# Patient Record
Sex: Male | Born: 1950
Health system: Southern US, Community
[De-identification: ages and names within clinical notes are randomized; demographics above are authoritative.]

## PROBLEM LIST (undated history)

## (undated) ENCOUNTER — Inpatient Hospital Stay: Payer: Self-pay | Admitting: Physician Assistant

## (undated) DIAGNOSIS — T7840XA Allergy, unspecified, initial encounter: Secondary | ICD-10-CM

## (undated) DIAGNOSIS — I739 Peripheral vascular disease, unspecified: Secondary | ICD-10-CM

## (undated) DIAGNOSIS — E785 Hyperlipidemia, unspecified: Secondary | ICD-10-CM

## (undated) DIAGNOSIS — I10 Essential (primary) hypertension: Secondary | ICD-10-CM

## (undated) DIAGNOSIS — M1990 Unspecified osteoarthritis, unspecified site: Secondary | ICD-10-CM

## (undated) DIAGNOSIS — F419 Anxiety disorder, unspecified: Secondary | ICD-10-CM

## (undated) DIAGNOSIS — C61 Malignant neoplasm of prostate: Secondary | ICD-10-CM

## (undated) DIAGNOSIS — K219 Gastro-esophageal reflux disease without esophagitis: Secondary | ICD-10-CM

## (undated) HISTORY — DX: Essential (primary) hypertension: I10

## (undated) HISTORY — DX: Hyperlipidemia, unspecified: E78.5

## (undated) HISTORY — DX: Peripheral vascular disease, unspecified: I73.9

## (undated) HISTORY — DX: Anxiety disorder, unspecified: F41.9

## (undated) HISTORY — DX: Malignant neoplasm of prostate: C61

## (undated) HISTORY — DX: Allergy, unspecified, initial encounter: T78.40XA

## (undated) HISTORY — PX: ROTATOR CUFF REPAIR: SHX139

## (undated) HISTORY — PX: KNEE ARTHROSCOPY: SUR90

## (undated) SURGERY — Surgical Case
Anesthesia: *Unknown

---

## 2000-07-19 ENCOUNTER — Encounter: Admission: RE | Admit: 2000-07-19 | Discharge: 2000-10-17 | Payer: Self-pay | Admitting: Family Medicine

## 2003-11-23 ENCOUNTER — Ambulatory Visit (HOSPITAL_COMMUNITY): Admission: RE | Admit: 2003-11-23 | Discharge: 2003-11-23 | Payer: Self-pay | Admitting: Gastroenterology

## 2005-01-19 ENCOUNTER — Ambulatory Visit (HOSPITAL_BASED_OUTPATIENT_CLINIC_OR_DEPARTMENT_OTHER): Admission: RE | Admit: 2005-01-19 | Discharge: 2005-01-19 | Payer: Self-pay | Admitting: Orthopedic Surgery

## 2005-01-19 ENCOUNTER — Ambulatory Visit (HOSPITAL_COMMUNITY): Admission: RE | Admit: 2005-01-19 | Discharge: 2005-01-19 | Payer: Self-pay | Admitting: Orthopedic Surgery

## 2005-01-19 ENCOUNTER — Encounter (INDEPENDENT_AMBULATORY_CARE_PROVIDER_SITE_OTHER): Payer: Self-pay | Admitting: Specialist

## 2008-03-02 HISTORY — PX: EYE SURGERY: SHX253

## 2010-01-31 ENCOUNTER — Encounter: Admission: RE | Admit: 2010-01-31 | Discharge: 2010-01-31 | Payer: Self-pay | Admitting: Interventional Cardiology

## 2010-07-18 NOTE — Op Note (Signed)
Michael [name], Michael [name]              ACCOUNT NO.:  [id number]   MEDICAL RECORD NO.:  [id number]          PATIENT TYPE:  AMB   LOCATION:  DSC                          FACILITY:  [facility]   PHYSICIAN:  Harvie Junior, M.D.   DATE OF BIRTH:  [birth year]   DATE OF PROCEDURE:  01/19/2005  DATE OF DISCHARGE:                                 OPERATIVE REPORT   PREOPERATIVE DIAGNOSIS:  Medial meniscal tear with suspected chondromalacia  medially.   POSTOPERATIVE DIAGNOSIS:  1.  Medial meniscal tear with chondromalacia of the medial compartment.  2.  Lateral meniscal tear.  3.  Chondromalacia in the patellofemoral joint.  4.  Proliferative synovitis, medial, lateral, anterior, superior medial, and      superior lateral.   PROCEDURE:  1.  Partial medial and partial lateral meniscectomy with corresponding      debridement of chondromalacia in the medial femoral compartment.  2.  Debridement of chondromalacia of the patella.  3.  Four compartment synovectomy of chondromalacia with sending of a      synovial biopsy.   SURGEON:  Harvie Junior, M.D.   ASSISTANT:  Marshia Ly, P.A.-C.   ANESTHESIA:  General.   BRIEF HISTORY:  Mr. Eichinger is a 60 year old male with a long history of  having a twisting type injury to his knee at work.  He was ultimately  evaluated by Dr. Scherrie Bateman who sent him for MRI which showed that he had a  medial meniscal tear as well as some chondromalacia in the patellar medial  compartment.  We evaluated him in the office and felt that he needed knee  arthroscopy because of continued complaints of pain and effusion.  He  ultimately was taken to the operating room for this procedure.   DESCRIPTION OF PROCEDURE:  The patient was taken to the operating room and  after adequate anesthesia was obtained with general anesthesia, the patient  was placed on the operating table.  The left leg was prepped and draped in  the usual sterile fashion.  Following this, routine  arthroscopic examination  of the knee revealed there was an obvious effusion in the knee joint which  was drained.  He had significant chondromalacia on the medial femoral  condyle which was debrided as well as a flap tear back posteriorly.  He had  posterior horn medial meniscal tear under surface which was debrided.  Over  laterally, he had a radial tear in the posterior horn of the lateral  meniscus which was debrided.  He had proliferative synovitis, really quite  dramatically throughout the knee.  In the lateral gutter, the synovectomy  was performed.  The medial gutter had a synovectomy performed.  Anteriorly,  a synovectomy was performed.  He also had chondromalacia of the patella  which was debrided at this point.  I then went up into the superior patellar  pouch and we did debridement of his synovium superior medially as well as  superior laterally and got a significant amount of synovial tissue out.  It  was a little bit concerning the amount of synovium that he had  and biopsy  forceps were used and this proliferative synovium was taken out of the knee  and sent to pathology for evaluation for possible pigmented villonodular  synovitis.  At this point, the knee was copiously  irrigated and suctioned dry.  The arthroscopic portals were closed with a  bandage.  A sterile compressive dressing was applied.  The patient was taken  to the recovery room and was noted to be in satisfactory condition.  Estimated blood loss was none.      Harvie Junior, M.D.  Electronically Signed     JLG/MEDQ  D:  01/19/2005  T:  01/19/2005  Job:  [id number]

## 2010-07-18 NOTE — Op Note (Signed)
NAME:  Michael [name], Michael [name]              ACCOUNT NO.:  [id number]   MEDICAL RECORD NO.:  [id number]          PATIENT TYPE:  AMB   LOCATION:  ENDO                         FACILITY:  [facility]   PHYSICIAN:  John C. Madilyn Fireman, M.D.    DATE OF BIRTH:  [birth year]   DATE OF PROCEDURE:  11/23/2003  DATE OF DISCHARGE:                                 OPERATIVE REPORT   PROCEDURE:  Colonoscopy.   INDICATIONS FOR PROCEDURE:  Average-risk colon cancer screening in a 60-year-  old patient with no previous screening.   PROCEDURE:  The patient was placed in the left lateral decubitus position  and placed on the pulse monitor with continuous low flow oxygen delivered by  nasal cannula.  He was sedated with 75 mcg IV fentanyl and 6 mg IV Versed.  The Olympus video colonoscope is inserted into the rectum and advanced to  the cecum, confirmed by transillumination at McBurney's point and  visualization of the ileocecal valve and appendiceal orifice.  Prep is  excellent.  The cecum, ascending, transverse, descending, and sigmoid colon  all appeared normal with no masses, polyps, diverticula, or other mucosal  abnormalities.  The rectum likewise appeared normal, and retroflexed view of  the anus revealed no obvious internal hemorrhoids.  The scope was then  withdrawn, and the patient returned to the recovery room in stable  condition.  He tolerated the procedure well, and there were no immediate  complications.   IMPRESSION:  Normal colonoscopy.   PLAN:  Next colon screening by sigmoidoscopy in five years.      JCH/MEDQ  D:  11/23/2003  T:  11/23/2003  Job:  [id number]   cc:   Molly Maduro L. Foy [place], M.D.  [address] [place]  [place] [address]  Fax: [phone]

## 2010-09-01 ENCOUNTER — Other Ambulatory Visit: Payer: Self-pay | Admitting: Specialist

## 2010-09-01 DIAGNOSIS — M25562 Pain in left knee: Secondary | ICD-10-CM

## 2010-09-05 ENCOUNTER — Ambulatory Visit
Admission: RE | Admit: 2010-09-05 | Discharge: 2010-09-05 | Disposition: A | Discharge status: Home / Self Care | Payer: PRIVATE HEALTH INSURANCE | Source: Ambulatory Visit | Attending: Specialist | Admitting: Specialist

## 2010-09-05 DIAGNOSIS — M25562 Pain in left knee: Secondary | ICD-10-CM

## 2010-10-27 ENCOUNTER — Encounter: Payer: Self-pay | Admitting: Vascular Surgery

## 2010-10-27 ENCOUNTER — Ambulatory Visit (INDEPENDENT_AMBULATORY_CARE_PROVIDER_SITE_OTHER): Payer: PRIVATE HEALTH INSURANCE | Admitting: Vascular Surgery

## 2010-10-27 ENCOUNTER — Encounter (INDEPENDENT_AMBULATORY_CARE_PROVIDER_SITE_OTHER): Payer: PRIVATE HEALTH INSURANCE

## 2010-10-27 VITALS — BP 137/83 | HR 110 | Resp 20 | Ht 71.0 in | Wt 225.0 lb

## 2010-10-27 DIAGNOSIS — I70219 Atherosclerosis of native arteries of extremities with intermittent claudication, unspecified extremity: Secondary | ICD-10-CM

## 2010-10-27 DIAGNOSIS — I739 Peripheral vascular disease, unspecified: Secondary | ICD-10-CM

## 2010-10-27 NOTE — Progress Notes (Signed)
Subjective:     Patient ID: Michael [name], male   DOB: [birth year], 60 y.o.   MRN: [id number]  HPI this patient is a 60 year old male with severe right leg claudication symptoms followed by Dr. Eldridge Dace. He states that he is only able to walk about 50 feet before experiencing severe claudication cramping in the right calf extending up into the lower thigh. His right foot becomes numb and tingles. He has no symptoms in the left leg. He denies any history of nonhealing ulcers infection or gangrene. He has been tried on Pletal and Trental with no improvement with either medication. He had a CT scan with contrast performed about a year ago which apparently revealed some blockage in his right superficial femoral artery. He is seen today for further evaluation for these limiting symptoms.  Past Medical History  Diagnosis Date  . Diabetes mellitus   . Hypertension   . Hyperlipidemia   . Anxiety   . Allergy     History  Substance Use Topics  . Smoking status: Never Smoker   . Smokeless tobacco: Former Neurosurgeon    Quit date: 10/26/1981  . Alcohol Use: 0.0 oz/week     occaisional    Family History  Problem Relation Age of Onset  . Heart disease Father     Allergies  Allergen Reactions  . Demerol   . Penicillins     Current outpatient prescriptions:amLODipine (NORVASC) 10 MG tablet, Take 10 mg by mouth daily.  , Disp: , Rfl: ;  aspirin 325 MG EC tablet, Take 325 mg by mouth daily.  , Disp: , Rfl: ;  benazepril (LOTENSIN) 40 MG tablet, Take 40 mg by mouth daily.  , Disp: , Rfl: ;  clonazePAM (KLONOPIN) 0.5 MG tablet, Take 0.5 mg by mouth daily.  , Disp: , Rfl: ;  hydrochlorothiazide 25 MG tablet, Take 25 mg by mouth daily. One half daily  , Disp: , Rfl:  ibuprofen (ADVIL,MOTRIN) 200 MG tablet, Take 200 mg by mouth every 6 (six) hours as needed.  , Disp: , Rfl: ;  insulin aspart (NOVOLOG) 100 UNIT/ML injection, Inject 100 Units into the skin 3 (three) times daily before meals.  , Disp: , Rfl: ;   insulin glargine (LANTUS) 100 UNIT/ML injection, Inject 100 Units into the skin at bedtime.  , Disp: , Rfl:  metFORMIN (GLUMETZA) 1000 MG (MOD) 24 hr tablet, Take 1,000 mg by mouth daily with breakfast.  , Disp: , Rfl: ;  nabumetone (RELAFEN) 500 MG tablet, Take 500 mg by mouth 2 (two) times daily.  , Disp: , Rfl: ;  pentoxifylline (TRENTAL) 400 MG CR tablet, Take 400 mg by mouth 3 (three) times daily with meals.  , Disp: , Rfl: ;  rosuvastatin (CRESTOR) 10 MG tablet, Take 10 mg by mouth daily.  , Disp: , Rfl:  sildenafil (VIAGRA) 50 MG tablet, Take 50 mg by mouth daily as needed.  , Disp: , Rfl: ;  tadalafil (CIALIS) 20 MG tablet, Take 20 mg by mouth daily as needed.  , Disp: , Rfl:   BP 137/83  Pulse 110  Resp 20  Ht 5\' 11"  (1.803 m)  Wt 225 lb (102.059 kg)  BMI 31.38 kg/m2  Body mass index is 31.38 kg/(m^2).        Review of Systems he denies any chest pain dyspnea on exertion PND orthopnea or neurologic symptoms. He does have left knee joint pain and is followed by Dr. Otelia Sergeant. He also complains of numbness in  his right foot at times. All other systems are negative for complete review of systems.     Objective:   Physical Exam blood pressure is 137/83 heart rate 110 respirations 20 General he is well-developed well-nourished male is in no apparent distress he is alert and oriented x3 Lungs clear nostril patient no rhonchi or wheezing Cardiovascular exam reveals a regular rhythm no murmur  carotid pulses are 3+ with no bruits Abdomen soft nontender with no masses Neurologic exam is normal Musculoskeletal exam free measure deformities Skin is free of rashes Lower extremity exam of the left leg has 3+ femoral popliteal dorsalis pedis and posterior tibial pulse palpable. Right leg has a 1-2+ femoral pulse and his uncomfortable to deep palpation. There is no erythema or mass. He has absent popliteal and distal pulses in the right leg.  Today I ordered more stringy arterial Dopplers  which are reviewed and interpreted. ABI in the right leg is 0.4 in the left leg is 0.87. He has evidence of right superficial femoral occlusive    Assessment:     Probable right superficial femoral and/or popliteal occlusive disease on the right with severe limiting claudication symptoms right leg    Plan:     Aorto bifemoral angiography with a bilateral lower extremity runoff to be performed by Dr. Myra Gianotti on September 5. If intervention of right leg is feasible possibly will be performed at that time. If not we'll consider femoral popliteal bypass grafting depending on the anatomy. Risks and benefits have been thoroughly discussed and he would like to proceed signed

## 2010-11-05 ENCOUNTER — Ambulatory Visit (HOSPITAL_COMMUNITY)
Admission: RE | Admit: 2010-11-05 | Discharge: 2010-11-05 | Disposition: A | Discharge status: Home / Self Care | Payer: PRIVATE HEALTH INSURANCE | Source: Ambulatory Visit | Attending: Surgery | Admitting: Surgery

## 2010-11-05 DIAGNOSIS — E785 Hyperlipidemia, unspecified: Secondary | ICD-10-CM | POA: Insufficient documentation

## 2010-11-05 DIAGNOSIS — I70219 Atherosclerosis of native arteries of extremities with intermittent claudication, unspecified extremity: Secondary | ICD-10-CM | POA: Insufficient documentation

## 2010-11-05 DIAGNOSIS — F411 Generalized anxiety disorder: Secondary | ICD-10-CM | POA: Insufficient documentation

## 2010-11-05 DIAGNOSIS — E119 Type 2 diabetes mellitus without complications: Secondary | ICD-10-CM | POA: Insufficient documentation

## 2010-11-05 DIAGNOSIS — I10 Essential (primary) hypertension: Secondary | ICD-10-CM | POA: Insufficient documentation

## 2010-11-05 HISTORY — PX: ANGIOPLASTY / STENTING FEMORAL: SUR30

## 2010-11-05 LAB — POCT ACTIVATED CLOTTING TIME
Activated Clotting Time: 204 seconds
Activated Clotting Time: 204 seconds
Activated Clotting Time: 171 seconds

## 2010-11-05 LAB — POCT I-STAT, CHEM 8
HCT: 43 % (ref 39.0–52.0)
Hemoglobin: 14.6 g/dL (ref 13.0–17.0)
Potassium: 4 mEq/L (ref 3.5–5.1)
Sodium: 137 mEq/L (ref 135–145)
TCO2: 19 mmol/L (ref 0–100)

## 2010-11-05 LAB — GLUCOSE, CAPILLARY
Glucose-Capillary: 210 mg/dL — ABNORMAL HIGH (ref 70–99)
Glucose-Capillary: 223 mg/dL — ABNORMAL HIGH (ref 70–99)

## 2010-11-06 ENCOUNTER — Telehealth: Payer: Self-pay

## 2010-11-06 DIAGNOSIS — R52 Pain, unspecified: Secondary | ICD-10-CM

## 2010-11-06 MED ORDER — HYDROCODONE-ACETAMINOPHEN 5-500 MG PO TABS: 1.0000 | ORAL_TABLET | Freq: Four times a day (QID) | ORAL | Status: AC | PRN

## 2010-11-06 NOTE — Telephone Encounter (Addendum)
Pt. states had an angiogram yesterday, and due to bleeding, had a clamp used to control bleeding, and had a lot of pain from all the "pressure" from the clamp.  States left groin was swollen twice the size when he got home yesterday, but no bruising noted at that time.  States this am when he got up, noted large amt of black and blue discoloration above and below the left groin.  C/o a lot of pain in left groin that goes through to back and kidney area.  States I really feel that the pain is related to the amt of pressure used yesterday.  Denies that area of bruising is firm.  States it feels soft.  Also, says that the swelling is slightly increased from last night.  Requesting pain medication.  Advised pt. will call Dr. Myra Gianotti to inform of pt. Sx's.  Called Dr. Myra Gianotti and informed of above pt. Complaints.  Rec'd verbal order to call in pain medication per standing order.  Pt.advised of pain medication to be called in./CP,RN 11/06/10 11:00 am  Called in Hydrocodone/Acetaminophen 5/500mg , take 1 tab q 6 hrs/prn/pain/ #20/ no refillls  to [facility] on [address]./CP,RN

## 2010-11-07 ENCOUNTER — Telehealth: Payer: Self-pay | Admitting: *Deleted

## 2010-11-07 NOTE — Telephone Encounter (Signed)
Pt called to say he is still in a lot of pain. Bruising & Swelling still in Left Leg where RN in PACU put clamp. Now testicles are purple and swollen but soft;no hardness. When called was in a lot of pain. Since called has eaten, taken vicodin, walked around a little; has put ice on testicles and is elevating leg & testicles; and feels better. I told him to call back if gets hard,actively bleeds or increases in swelling. Also, if happens over weekend to go to ER.

## 2010-11-11 ENCOUNTER — Telehealth: Payer: Self-pay

## 2010-11-11 DIAGNOSIS — R1909 Other intra-abdominal and pelvic swelling, mass and lump: Secondary | ICD-10-CM

## 2010-11-11 DIAGNOSIS — R52 Pain, unspecified: Secondary | ICD-10-CM

## 2010-11-11 NOTE — Telephone Encounter (Signed)
Pt. called to report continued pain in left groin, that is worse when getting up and down; ie: "getting in and out of car".  Also states that the left groin has a "3" long  x 1/2" wide , firm area" in site, where catheter was introduced. Also c/o pain in left thigh, and down to behind knee. States does not feel he can continue to work until this is evaluated, due to the discomfort.  Discussed w/ [name].  Advised to have pt. scheduled for ultrasound of left groin, and appt. w/ MD or PA. Tomorrow (9/12) or Thursday (9/13).  Advised pt. will call him tomorrow with appt.  Agrees w/ plan.

## 2010-11-12 ENCOUNTER — Other Ambulatory Visit (INDEPENDENT_AMBULATORY_CARE_PROVIDER_SITE_OTHER): Payer: PRIVATE HEALTH INSURANCE

## 2010-11-12 ENCOUNTER — Ambulatory Visit (INDEPENDENT_AMBULATORY_CARE_PROVIDER_SITE_OTHER): Payer: PRIVATE HEALTH INSURANCE | Admitting: Physician Assistant

## 2010-11-12 VITALS — BP 150/98 | HR 105 | Resp 18

## 2010-11-12 DIAGNOSIS — S75009A Unspecified injury of femoral artery, unspecified leg, initial encounter: Secondary | ICD-10-CM

## 2010-11-12 DIAGNOSIS — R1909 Other intra-abdominal and pelvic swelling, mass and lump: Secondary | ICD-10-CM

## 2010-11-12 DIAGNOSIS — I739 Peripheral vascular disease, unspecified: Secondary | ICD-10-CM

## 2010-11-12 DIAGNOSIS — R52 Pain, unspecified: Secondary | ICD-10-CM

## 2010-11-12 NOTE — Progress Notes (Signed)
HPI: 60 y/o WM who underwent stenting of his Right SFA and popliteal artery by Dr. Myra Gianotti on 11/05/10 with the arterial stick on the left.  He returns today with complaints of tenderness, swelling as well as swelli and soreness of his left groin.  He has a hard time getting in and out of the car.  It is bothersome to him to stand from a sitting position.  He states that he has tingling in his left foot, which was not present before.  He had LE arterial duplex prior to the appt with the provider.  Filed Vitals:   11/12/10 1403  BP: 150/98  Pulse: 105  Resp: 18    Physical Exam:  He has extensive ecchymosis of his left groin, thigh, around to his buttocks.   There is a hematoma in his left groin.  It is tender to palpation. 2+ DP pulses bilaterally and both his LE are warm and well perfused.  Lower Extremity Duplex Scan:  Left Groin pseudoaneurysm and hematoma 2.96cm x 2.53cm AP x 3.1 cm   Assessment: 60 y/o WM s/p Stenting of the right SFA and popliteal artery.  Now with pseudoaneurysm in left groin.  Plan:  After Dr. Adele Dan assessment of the pt, he spoke with the lab tech and the pseudoaneurysm is probably not compressible.  He will speak to Dr. Myra Gianotti tomorrow to decide upon a treatment plan, which will consist of either waiting and watching with f/u in 2 weeks or return to the OR for repair and washout of the hematoma.  Rx for oxycodone 5mg  1 po q 4-6hr prn pain #30 NF was given.  Pt knows to call Friday mid-morning if he has not heard back from our office.  Attending is Dr. Edilia Bo

## 2010-11-14 ENCOUNTER — Telehealth: Payer: Self-pay

## 2010-11-14 DIAGNOSIS — I729 Aneurysm of unspecified site: Secondary | ICD-10-CM

## 2010-11-14 NOTE — Telephone Encounter (Signed)
Pt. called to ask what plan is for follow-up, or treatment to pseudoaneurysm (L) groin?  Called Dr. Myra Gianotti re: plan.  Advised if pt. Doing okay, then have him scheduled for U/S left groin, and office visit in one week.  Called pt. Back to check on his status.  States "it still really hurts when he sits, but that the bruising is better".  Related that  he hasn't been taking the Oxycodone only because it makes him feel "funny", but that the Ibuprofen wasn't really helping the pain.  States he is comfortable continuing to monitor lump in left groin, and will follow-up in one wk as directed. Advised pt. to call if worsens. Verb. Understanding.

## 2010-11-18 NOTE — Procedures (Unsigned)
LOWER EXTREMITY ARTERIAL EVALUATION-SINGLE LEVEL  INDICATION:  Bruising and "lump" in left groin.  HISTORY: Diabetes: Cardiac: Hypertension: Smoking: Previous Surgery:  11/05/10 - left femoral artery access for abdominal aortogram with right lower extremity run-off; stent placement in right superficial femoral artery/popliteal artery.  RESTING SYSTOLIC PRESSURES: (ABI)                         RIGHT                LEFT Brachial: Anterior tibial: Posterior tibial: Peroneal: DOPPLER WAVEFORM ANALYSIS: Anterior tibial: Posterior tibial: Peroneal:  PREVIOUS ABI'S:  Date:  RIGHT:  LEFT:  ABI's not performed.  DUPLEX:  Triphasic normal velocity (102 cm/s) flow in the distal left external iliac and common femoral arteries.  Phasic left common femoral vein with normal response to distal augmentation.  There is a 2.96 cm sagittal X 2.53 cm AP X 3.1 cm transverse mass in the left groin anterior to the common femoral artery and vein.  Flow is noted within the mass with a "neck" to the common femoral artery being present. Thrombus is noted in the periphery of the mass.  Inferiorly, there is a larger hypoechoic mass with no flow noted, most likely represents hematoma.  This area measures 2.9 cm sagittal X 1.43 cm AP X 3.2 cm transverse.  IMPRESSION:  Left groin pseudoaneurysm and hematoma with measurements noted above.  ___________________________________________ Quita Skye. Hart Rochester, M.D.  CI/MEDQ  D:  11/13/2010  T:  11/13/2010  Job:  [id number]

## 2010-11-19 ENCOUNTER — Encounter: Payer: Self-pay | Admitting: Surgery

## 2010-11-21 ENCOUNTER — Encounter: Payer: Self-pay | Admitting: Surgery

## 2010-11-24 ENCOUNTER — Encounter: Payer: Self-pay | Admitting: Surgery

## 2010-11-24 ENCOUNTER — Ambulatory Visit (INDEPENDENT_AMBULATORY_CARE_PROVIDER_SITE_OTHER): Payer: PRIVATE HEALTH INSURANCE | Admitting: Surgery

## 2010-11-24 ENCOUNTER — Other Ambulatory Visit (INDEPENDENT_AMBULATORY_CARE_PROVIDER_SITE_OTHER): Payer: PRIVATE HEALTH INSURANCE | Admitting: *Deleted

## 2010-11-24 VITALS — BP 153/84 | HR 88 | Temp 98.4°F | Ht 71.0 in | Wt 225.0 lb

## 2010-11-24 DIAGNOSIS — I724 Aneurysm of artery of lower extremity: Secondary | ICD-10-CM

## 2010-11-24 DIAGNOSIS — S301XXA Contusion of abdominal wall, initial encounter: Secondary | ICD-10-CM

## 2010-11-24 DIAGNOSIS — I729 Aneurysm of unspecified site: Secondary | ICD-10-CM

## 2010-11-24 DIAGNOSIS — I739 Peripheral vascular disease, unspecified: Secondary | ICD-10-CM

## 2010-11-24 DIAGNOSIS — I70219 Atherosclerosis of native arteries of extremities with intermittent claudication, unspecified extremity: Secondary | ICD-10-CM | POA: Insufficient documentation

## 2010-11-24 NOTE — Progress Notes (Signed)
[facility]    Patient name: Michael [name] MRN: [id number] DOB: 1950/07/17 Sex: male     Chief Complaint  Patient presents with  . Aneurysm    postop SFA stenting on 11-05-10    HISTORY OF PRESENT ILLNESS: This is a 60 year old gentleman who comes back today for followup. He was initially seen and evaluated by [name] for right leg claudication. He underwent arteriogram and stenting of his right superficial femoral artery on 11/05/2010 greater than 95% distal right superficial femoral artery stenosis was found with three-vessel runoff he is the patient's symptoms have dissipated. He is now able to ambulate without difficulty. He is complaining of swelling in his right leg. He was complaining of left groin pain and was seen in our office and evaluated with ultrasound which showed a large pseudoaneurysm approximately 2 x 3 cm with a long neck. I elected to observe this he comes back in today for followup and repeat ultrasound evaluation. I did speak with him over the telephone discussed with him options of injection versus operation versus observation. He wished to continue to observe this. He is back today for followup he has had no significant changes Past Medical History  Diagnosis Date  . Diabetes mellitus   . Hypertension   . Hyperlipidemia   . Anxiety   . Allergy     Past Surgical History  Procedure Date  . Rotator cuff repair     1990  . Knee arthroscopy   . Angioplasty / stenting femoral 11/05/10    Left SFA stent    History   Social History  . Marital Status: Married    Spouse Name: N/A    Number of Children: N/A  . Years of Education: N/A   Occupational History  . Not on file.   Social History Main Topics  . Smoking status: Never Smoker   . Smokeless tobacco: Former Neurosurgeon    Quit date: 10/26/1981  . Alcohol Use: 0.0 oz/week     occasional drink  . Drug Use: No  . Sexually Active: Not on file   Other Topics Concern  . Not on  file   Social History Narrative  . No narrative on file    Family History  Problem Relation Age of Onset  . Heart disease Father     Allergies as of 11/24/2010 - Review Complete 11/24/2010  Allergen Reaction Noted  . Demerol  10/27/2010  . Penicillins  10/27/2010    Current Outpatient Prescriptions on File Prior to Visit  Medication Sig Dispense Refill  . amLODipine (NORVASC) 10 MG tablet Take 10 mg by mouth daily.        Marland Kitchen aspirin 325 MG EC tablet Take 325 mg by mouth daily.        . benazepril (LOTENSIN) 40 MG tablet Take 40 mg by mouth daily.        . clonazePAM (KLONOPIN) 0.5 MG tablet Take 0.5 mg by mouth daily.        . hydrochlorothiazide 25 MG tablet Take 25 mg by mouth daily. One half daily        . ibuprofen (ADVIL,MOTRIN) 200 MG tablet Take 200 mg by mouth every 6 (six) hours as needed.        . insulin aspart (NOVOLOG) 100 UNIT/ML injection Inject 100 Units into the skin 3 (three) times daily before meals.        . insulin glargine (LANTUS) 100 UNIT/ML injection Inject 100 Units into the  skin at bedtime.        . metFORMIN (GLUMETZA) 1000 MG (MOD) 24 hr tablet Take 1,000 mg by mouth daily with breakfast.        . nabumetone (RELAFEN) 500 MG tablet Take 500 mg by mouth 2 (two) times daily.        . pentoxifylline (TRENTAL) 400 MG CR tablet Take 400 mg by mouth 3 (three) times daily with meals.        . rosuvastatin (CRESTOR) 10 MG tablet Take 10 mg by mouth daily.        . sildenafil (VIAGRA) 50 MG tablet Take 50 mg by mouth daily as needed.        . tadalafil (CIALIS) 20 MG tablet Take 20 mg by mouth daily as needed.           REVIEW OF SYSTEMS: No changes  PHYSICAL EXAMINATION: General: The patient appears their stated age.  Vital signs are BP 153/84  Pulse 88  Temp(Src) 98.4 F (36.9 C) (Oral)  Ht 5\' 11"  (1.803 m)  Wt 225 lb (102.059 kg)  BMI 31.38 kg/m2 Pulmonary: There is a good air exchange bilaterally without wheezing or rales. Musculoskeletal: There  are no major deformities.  There is no significant extremity pain. Neurologic: No focal weakness or paresthesias are detected, Skin: There are no ulcer or rashes noted. Psychiatric: The patient has normal affect. Cardiovascular: There is a regular rate and rhythm without significant murmur appreciated. Palpable dorsalis pedis pulse bilaterally   Diagnostic Studies Followup ultrasound today shows a left groin hematoma measuring 1.98 x 3.29 cm. There is no evidence of arterial flow within the hematoma.  Assessment: Status post right superficial femoral artery stenting for claudication complicated by left groin pseudoaneurysm Plan: The patient is doing very well. He is status post superficial femoral artery stenting using a silver 6 x 80 and 6 x 1 20 stent. The claudication symptoms in his right leg have been resolved. His only complaint is that of swelling have given him a prescription for 20-30 mm compression stockings. I suspect this will resolve over the next 3-6 months.  The left groin pseudoaneurysm has spontaneously thrombose. This has been documented by ultrasound.  The patient will come back to see me in 3 months with ultrasound of the left groin and right leg stent  V. Charlena Cross, M.D. [facility] [place] Office: [phone]

## 2010-11-24 NOTE — Progress Notes (Signed)
Addended by: Sharee Pimple on: 11/24/2010 03:28 PM   Modules accepted: Orders

## 2010-11-29 NOTE — Op Note (Signed)
Michael [name], ACHOR              ACCOUNT NO.:  [id number]  MEDICAL RECORD NO.:  [id number]  LOCATION:  [facility]SC                         FACILITY:  [facility]  PHYSICIAN:  Juleen China IV, MDDATE OF BIRTH:  1950-10-08  DATE OF PROCEDURE:  11/05/2010 DATE OF DISCHARGE:                              OPERATIVE REPORT   PREOPERATIVE DIAGNOSIS:  Right leg claudication.  POSTOPERATIVE DIAGNOSIS:  Right leg claudication.  PROCEDURE PERFORMED: 1. Ultrasound access left femoral artery. 2. Abdominal aortogram. 3. Right lower extremity runoff. 4. Stent right superficial femoral/popliteal artery. 5. Third-order catheterization.  INDICATIONS:  This is a 60 year old gentleman with lifestyle-limiting claudication of the right leg, his ABIs dropped to 0.40, he can walk less than 50 feet.  He comes in today for evaluation of possible intervention.  Risks and benefits were discussed in the holding area. Informed consent was signed.  PROCEDURE:  The patient was identified in the holding area, taken to room 8, placed supine on the operating table.  Both groins were prepped and draped in usual fashion.  Time-out was called.  The left femoral artery was evaluated by ultrasound found to be calcified, but patent. It was accessed under ultrasound guidance with an 18-gauge needle and a 0.035 wire was advanced into the aorta under fluoroscopic visualization, a 5-French sheath was placed.  Over the wire Omni Flush catheter was advanced to the level L1, abdominal aortogram was obtained.  Next, using the Omni Flush catheter, a Bentson wire and a Glidewire, the aortic bifurcation was crossed.  Catheter was placed in the right external iliac artery and right leg runoff was performed.  FINDINGS:  Aortogram:  Visualized portions of the suprarenal abdominal aorta showed no significant disease.  There is a single right renal artery which is widely patent.  There is a main left and accessory left renal artery  which are widely patent without significant stenosis.  The infrarenal abdominal aorta is widely patent, bilateral common external and internal iliac arteries are widely patent.  Right lower extremity:  The right common femoral artery is calcified without stenosis.  The right profunda femoral artery is patent throughout its course.  The right superficial femoral artery is heavily calcified.  There is a high-grade stenosis approximately 95% in the distal superficial femoral artery with diffuse disease in the distal superficial femoral artery for approximately 20 cm.  Popliteal artery below the knee is widely patent.  There is three-vessel runoff.  At this point, the decision was made to intervene over a Rosen wire a 7- [place] 45-cm Ansel 1 sheath was placed in the right external iliac artery.  The patient was fully heparinized.  Heparin levels were checked with ACT throughout the case.  Next, I used a Glide cath and a Glidewire to attempt to cross the lesion in the right superficial femoral artery. The Glide cath was not long enough and therefore, however, I was able to get a Versacore wire across the lesion and back intraluminal, I do believe I went subintimal to cross this lesion as it was heavily calcified and very difficult to cross.  I tried to advance a 3-mm balloon across this stenosis over the Avala wire, however, this would not  advance.  I therefore used an 0.035 Quick-Cross catheter which did cross the lesion.  A contrast injection was then performed through the Quick-Cross catheter which was in the popliteal artery which confirmed successful cannulation.  I then elected to predilate the lesion.  I tried to use a 3-mm balloon, however, this would not cross the area of stenosis.  I then had to put the Quick-Cross catheter back down to switch out for no one for platform.  I then used a 2-mm balloon which did cross the lesion and perform sequential balloon dilatations with a 2,  3 and 4 mm balloons.  At this point, I attempted to place a stent in this area, however, even with an 0.18 platform stent, I could not get the stent to cross the lesion, therefore I came back with a 5-mm balloon and dilated the area of concern, after dilating it with a 5-mm balloon I was able to get a Cook Zilver 6 x 80 stent across this area and I then used a second 6 x 120 Cook Zilver stent.  The stents were molded to confirmation with 5-mm balloon.  Completion arteriogram was then performed which showed widely patent superficial femoral artery with no change in runoff.  At this point in time, decision was made to terminate the procedure.  Catheters and wires were removed.  The patient was taken to the holding area for sheath pull once his coagulation profile corrects.  IMPRESSION:  High-grade distal right superficial femoral artery stenosis, greater than 95%.  This was successfully stented using a Cook Zilver 6 x 80 and 6 x 120 self-expanding stents.  There is three-vessel runoff of the right leg.     Juleen China IV, MD     [facility]/MEDQ  D:  11/05/2010  T:  11/05/2010  Job:  [id number]  Electronically Signed by Arelia Longest IV MD on 11/29/2010 09:56:46 AM

## 2010-12-01 NOTE — Procedures (Unsigned)
VASCULAR LAB EXAM  INDICATION:  Follow up left groin pseudoaneurysm.  HISTORY: Diabetes:  Yes. Cardiac:  No. Hypertension:  Yes.  EXAM:  Left groin duplex to evaluate known pseudoaneurysm.  IMPRESSION: 1. Left groin hematoma measuring 1.98 X 3.29 cm was noted. 2. No evidence of arterial flow was noted within the hematoma.  ___________________________________________ V. Charlena Cross, MD  EM/MEDQ  D:  11/24/2010  T:  11/24/2010  Job:  [id number]

## 2010-12-09 ENCOUNTER — Other Ambulatory Visit: Payer: PRIVATE HEALTH INSURANCE

## 2010-12-09 ENCOUNTER — Ambulatory Visit: Payer: PRIVATE HEALTH INSURANCE | Admitting: Vascular Surgery

## 2011-01-12 ENCOUNTER — Ambulatory Visit: Payer: No Typology Code available for payment source | Attending: Specialist | Admitting: Physical Therapy

## 2011-01-12 DIAGNOSIS — M25569 Pain in unspecified knee: Secondary | ICD-10-CM | POA: Insufficient documentation

## 2011-01-12 DIAGNOSIS — IMO0001 Reserved for inherently not codable concepts without codable children: Secondary | ICD-10-CM | POA: Insufficient documentation

## 2011-01-12 DIAGNOSIS — R262 Difficulty in walking, not elsewhere classified: Secondary | ICD-10-CM | POA: Insufficient documentation

## 2011-01-12 DIAGNOSIS — M25669 Stiffness of unspecified knee, not elsewhere classified: Secondary | ICD-10-CM | POA: Insufficient documentation

## 2011-01-13 ENCOUNTER — Ambulatory Visit: Payer: No Typology Code available for payment source | Admitting: Physical Therapy

## 2011-01-19 ENCOUNTER — Ambulatory Visit: Payer: No Typology Code available for payment source | Admitting: Physical Therapy

## 2011-01-20 ENCOUNTER — Ambulatory Visit: Payer: No Typology Code available for payment source | Admitting: Physical Therapy

## 2011-01-26 ENCOUNTER — Ambulatory Visit: Payer: No Typology Code available for payment source | Admitting: Physical Therapy

## 2011-01-28 ENCOUNTER — Ambulatory Visit: Payer: No Typology Code available for payment source | Admitting: Physical Therapy

## 2011-02-02 ENCOUNTER — Ambulatory Visit: Payer: PRIVATE HEALTH INSURANCE | Attending: Specialist | Admitting: Physical Therapy

## 2011-02-02 DIAGNOSIS — M25669 Stiffness of unspecified knee, not elsewhere classified: Secondary | ICD-10-CM | POA: Insufficient documentation

## 2011-02-02 DIAGNOSIS — M25569 Pain in unspecified knee: Secondary | ICD-10-CM | POA: Insufficient documentation

## 2011-02-02 DIAGNOSIS — R262 Difficulty in walking, not elsewhere classified: Secondary | ICD-10-CM | POA: Insufficient documentation

## 2011-02-02 DIAGNOSIS — IMO0001 Reserved for inherently not codable concepts without codable children: Secondary | ICD-10-CM | POA: Insufficient documentation

## 2011-02-04 ENCOUNTER — Ambulatory Visit: Payer: PRIVATE HEALTH INSURANCE | Admitting: Physical Therapy

## 2011-02-09 ENCOUNTER — Encounter: Payer: PRIVATE HEALTH INSURANCE | Admitting: Physical Therapy

## 2011-02-09 ENCOUNTER — Ambulatory Visit: Payer: PRIVATE HEALTH INSURANCE | Admitting: Physical Therapy

## 2011-02-10 ENCOUNTER — Ambulatory Visit: Payer: PRIVATE HEALTH INSURANCE | Admitting: Physical Therapy

## 2011-02-11 ENCOUNTER — Encounter: Payer: PRIVATE HEALTH INSURANCE | Admitting: Physical Therapy

## 2011-02-12 ENCOUNTER — Encounter: Payer: PRIVATE HEALTH INSURANCE | Admitting: Physical Therapy

## 2011-02-27 ENCOUNTER — Encounter: Payer: Self-pay | Admitting: Surgery

## 2011-03-02 ENCOUNTER — Encounter: Payer: Self-pay | Admitting: Surgery

## 2011-03-02 ENCOUNTER — Other Ambulatory Visit (INDEPENDENT_AMBULATORY_CARE_PROVIDER_SITE_OTHER): Payer: PRIVATE HEALTH INSURANCE | Admitting: *Deleted

## 2011-03-02 ENCOUNTER — Ambulatory Visit (INDEPENDENT_AMBULATORY_CARE_PROVIDER_SITE_OTHER): Payer: PRIVATE HEALTH INSURANCE | Admitting: Surgery

## 2011-03-02 VITALS — BP 130/78 | HR 94 | Resp 16 | Ht 71.0 in | Wt 228.0 lb

## 2011-03-02 DIAGNOSIS — S301XXA Contusion of abdominal wall, initial encounter: Secondary | ICD-10-CM

## 2011-03-02 DIAGNOSIS — I724 Aneurysm of artery of lower extremity: Secondary | ICD-10-CM

## 2011-03-02 DIAGNOSIS — I739 Peripheral vascular disease, unspecified: Secondary | ICD-10-CM

## 2011-03-02 DIAGNOSIS — I70219 Atherosclerosis of native arteries of extremities with intermittent claudication, unspecified extremity: Secondary | ICD-10-CM

## 2011-03-02 DIAGNOSIS — Z48812 Encounter for surgical aftercare following surgery on the circulatory system: Secondary | ICD-10-CM

## 2011-03-02 NOTE — Procedures (Unsigned)
LOWER EXTREMITY ARTERIAL DUPLEX  INDICATION:  Follow up right SFA stenting.  HISTORY: Diabetes:  Yes. Cardiac:  __________  DICTATION ENDED AT THIS POINT. Hypertension: Smoking: Previous Surgery:  SINGLE LEVEL ARTERIAL EXAM                         RIGHT                LEFT Brachial: Anterior tibial: Posterior tibial: Peroneal: Ankle/Brachial Index:  LOWER EXTREMITY ARTERIAL DUPLEX EXAM  DUPLEX:  IMPRESSION:  ___________________________________________ V. Charlena Cross, MD  EM/MEDQ  D:  03/02/2011  T:  03/02/2011  Job:  [id number]

## 2011-03-02 NOTE — Progress Notes (Signed)
[facility] [place]   Patient name: Michael [name] MRN: [id number] DOB: Feb 18, 1951 Sex: male     Chief Complaint  Patient presents with  . PVD    3 month f/u on stenting 11-05-10 ( Right SFA)    HISTORY OF PRESENT ILLNESS: The patient is back today for followup. He was initially seen by [name] for right leg claudication. He underwent stenting of his right superficial femoral artery on 11/05/2010 for a greater than 95% distal right superficial femoral artery stenosis. He has three-vessel runoff. This procedure was complicated by a left groin pseudoaneurysm which resolved spontaneously. He's back today without complaints. He has no problems with claudication. Overall he is doing very well.  Past Medical History  Diagnosis Date  . Diabetes mellitus   . Hypertension   . Hyperlipidemia   . Anxiety   . Allergy     Past Surgical History  Procedure Date  . Rotator cuff repair     1990  . Knee arthroscopy   . Angioplasty / stenting femoral 11/05/10    Left SFA stent    History   Social History  . Marital Status: Married    Spouse Name: N/A    Number of Children: N/A  . Years of Education: N/A   Occupational History  . Not on file.   Social History Main Topics  . Smoking status: Never Smoker   . Smokeless tobacco: Former Neurosurgeon    Quit date: 10/26/1981  . Alcohol Use: 0.0 oz/week     occasional drink  . Drug Use: No  . Sexually Active: Not on file   Other Topics Concern  . Not on file   Social History Narrative  . No narrative on file    Family History  Problem Relation Age of Onset  . Heart disease Father     Allergies as of 03/02/2011 - Review Complete 03/02/2011  Allergen Reaction Noted  . Demerol  10/27/2010  . Penicillins  10/27/2010    Current Outpatient Prescriptions on File Prior to Visit  Medication Sig Dispense Refill  . amLODipine (NORVASC) 10 MG tablet Take 10 mg by mouth daily.        Marland Kitchen aspirin 325 MG EC tablet Take  325 mg by mouth daily.        . benazepril (LOTENSIN) 40 MG tablet Take 40 mg by mouth daily.        . clonazePAM (KLONOPIN) 0.5 MG tablet Take 0.5 mg by mouth daily.        . hydrochlorothiazide 25 MG tablet Take 25 mg by mouth daily. One half daily        . ibuprofen (ADVIL,MOTRIN) 200 MG tablet Take 200 mg by mouth every 6 (six) hours as needed.        . insulin aspart (NOVOLOG) 100 UNIT/ML injection Inject 100 Units into the skin 3 (three) times daily before meals.        . insulin glargine (LANTUS) 100 UNIT/ML injection Inject 100 Units into the skin at bedtime.        . metFORMIN (GLUMETZA) 1000 MG (MOD) 24 hr tablet Take 1,000 mg by mouth daily with breakfast.        . nabumetone (RELAFEN) 500 MG tablet Take 500 mg by mouth 2 (two) times daily.        . pentoxifylline (TRENTAL) 400 MG CR tablet Take 400 mg by mouth 3 (three) times daily with meals.       Marland Kitchen  rosuvastatin (CRESTOR) 10 MG tablet Take 10 mg by mouth daily.        . sildenafil (VIAGRA) 50 MG tablet Take 50 mg by mouth daily as needed.        . tadalafil (CIALIS) 20 MG tablet Take 20 mg by mouth daily as needed.           REVIEW OF SYSTEMS: No chest pain no shortness of breath no claudication or rest pain and ulceration. All other systems are negative.  PHYSICAL EXAMINATION:   Vital signs are BP 130/78  Pulse 94  Resp 16  Ht 5\' 11"  (1.803 m)  Wt 228 lb (103.42 kg)  BMI 31.80 kg/m2  SpO2 98% General: The patient appears their stated age. HEENT:  No gross abnormalities Pulmonary:  Non labored breathing Abdomen: Soft and non-tender Musculoskeletal: There are no major deformities. Neurologic: No focal weakness or paresthesias are detected, Skin: There are no ulcer or rashes noted. Psychiatric: The patient has normal affect. Cardiovascular: Palpable pedal pulses bilaterally left groin is soft   Diagnostic Studies Duplex was performed of the right leg. There is no evidence of elevated velocities within the right  superficial femoral and popliteal artery. The waveforms are triphasic.  Assessment: Status post right superficial femoral artery stenting Plan: The patient is doing very well. His duplex shows no evidence of stenosis. I did talk to him about the possibility of in-stent restenosis. He was placed on our surveillance protocol. He will see the PA in 3 months. I will schedule him back to see me in one year. He will need ultrasounds at 36 and 12 months following his procedure and then by yearly followed by yearly  V. Charlena Cross, M.D. [facility] [place] Office: [phone] Pager:  [phone]

## 2011-03-23 NOTE — Procedures (Unsigned)
VASCULAR LAB EXAM  INDICATION:  Follow up right SFA stenting.  HISTORY: Diabetes:  Yes. Cardiac:  No. Hypertension:  Yes. Other:  Hyperlipidemia.  EXAM:  Follow up right SFA stenting underwent 11/05/2010.  IMPRESSION: 1. Patent right superficial femoral artery with triphasic waveforms     noted throughout. 2. There is an elevated velocity of 243 cm/s noted in the mid to     distal superficial femoral artery.  ___________________________________________ V. Charlena Cross, MD  EM/MEDQ  D:  03/02/2011  T:  03/02/2011  Job:  [id number]

## 2011-06-12 ENCOUNTER — Encounter: Payer: Self-pay | Admitting: Surgery

## 2011-06-15 ENCOUNTER — Ambulatory Visit (INDEPENDENT_AMBULATORY_CARE_PROVIDER_SITE_OTHER): Payer: PRIVATE HEALTH INSURANCE | Admitting: Neurosurgery

## 2011-06-15 ENCOUNTER — Encounter: Payer: Self-pay | Admitting: Surgery

## 2011-06-15 ENCOUNTER — Ambulatory Visit (INDEPENDENT_AMBULATORY_CARE_PROVIDER_SITE_OTHER): Payer: PRIVATE HEALTH INSURANCE | Admitting: Vascular Surgery

## 2011-06-15 VITALS — BP 150/80 | HR 90 | Temp 98.3°F | Ht 71.0 in | Wt 227.0 lb

## 2011-06-15 DIAGNOSIS — Z48812 Encounter for surgical aftercare following surgery on the circulatory system: Secondary | ICD-10-CM

## 2011-06-15 DIAGNOSIS — I70219 Atherosclerosis of native arteries of extremities with intermittent claudication, unspecified extremity: Secondary | ICD-10-CM

## 2011-06-15 DIAGNOSIS — I739 Peripheral vascular disease, unspecified: Secondary | ICD-10-CM

## 2011-06-15 NOTE — Progress Notes (Signed)
[facility] [place] HISTORY AND PHYSICAL   CC: Six-month followup lower extremity arterial duplex Referring Physician: [name]  History of Present Illness: [name] of Michael [name] that was initially seen by Michael [name] for right leg claudication. He subsequently underwent a right superficial femoral artery stent in September 2012. Since that time he has done well, he reports no new medical problems, has had no new surgery. He does have diabetes that is fairly well-controlled, he does state that he is in a weight loss program.  Past Medical History  Diagnosis Date  . Diabetes mellitus   . Hypertension   . Hyperlipidemia   . Anxiety   . Allergy     ROS: [x]  Positive   [ ]  Denies    General: [ ]  Weight loss, [ ]  Fever, [ ]  chills Neurologic: [ ]  Dizziness, [ ]  Blackouts, [ ]  Seizure [ ]  Stroke, [ ]  "Mini stroke", [ ]  Slurred speech, [ ]  Temporary blindness; [ ]  weakness in arms or legs, [ ]  Hoarseness Cardiac: [ ]  Chest pain/pressure, [ ]  Shortness of breath at rest [ ]  Shortness of breath with exertion, [ ]  Atrial fibrillation or irregular heartbeat Vascular: [ ]  Pain in legs with walking, [ ]  Pain in legs at rest, [ ]  Pain in legs at night,  [ ]  Non-healing ulcer, [ ]  Blood clot in vein/DVT,   Pulmonary: [ ]  Home oxygen, [ ]  Productive cough, [ ]  Coughing up blood, [ ]  Asthma,  [ ]  Wheezing Musculoskeletal:  [ ]  Arthritis, [ ]  Low back pain, [ ]  Joint pain Hematologic: [ ]  Easy Bruising, [ ]  Anemia; [ ]  Hepatitis Gastrointestinal: [ ]  Blood in stool, [ ]  Gastroesophageal Reflux/heartburn, [ ]  Trouble swallowing Urinary: [ ]  chronic Kidney disease, [ ]  on HD - [ ]  MWF or [ ]  TTHS, [ ]  Burning with urination, [ ]  Difficulty urinating Skin: [ ]  Rashes, [ ]  Wounds Psychological: [ ]  Anxiety, [ ]  Depression   Social History History  Substance Use Topics  . Smoking status: Never Smoker   . Smokeless tobacco: Former Neurosurgeon    Quit date:  10/26/1981  . Alcohol Use: 0.0 oz/week     occasional drink    Family History Family History  Problem Relation Age of Onset  . Heart disease Father     Allergies  Allergen Reactions  . Demerol   . Penicillins     Current Outpatient Prescriptions  Medication Sig Dispense Refill  . amLODipine (NORVASC) 10 MG tablet Take 10 mg by mouth daily.        Marland Kitchen aspirin 325 MG EC tablet Take 325 mg by mouth daily.        . benazepril (LOTENSIN) 40 MG tablet Take 40 mg by mouth daily.        . clonazePAM (KLONOPIN) 0.5 MG tablet Take 0.5 mg by mouth daily.        . hydrochlorothiazide 25 MG tablet Take 25 mg by mouth daily. One half daily        . ibuprofen (ADVIL,MOTRIN) 200 MG tablet Take 200 mg by mouth every 6 (six) hours as needed.        . insulin aspart (NOVOLOG) 100 UNIT/ML injection Inject 100 Units into the skin 3 (three) times daily before meals.        . insulin glargine (LANTUS) 100 UNIT/ML injection Inject 100 Units into the skin at bedtime.        Marland Kitchen  metFORMIN (GLUMETZA) 1000 MG (MOD) 24 hr tablet Take 1,000 mg by mouth daily with breakfast.        . nabumetone (RELAFEN) 500 MG tablet Take 500 mg by mouth 2 (two) times daily.        . rosuvastatin (CRESTOR) 10 MG tablet Take 10 mg by mouth daily.        . sildenafil (VIAGRA) 50 MG tablet Take 50 mg by mouth daily as needed.        . tadalafil (CIALIS) 20 MG tablet Take 20 mg by mouth daily as needed.        . pentoxifylline (TRENTAL) 400 MG CR tablet Take 400 mg by mouth 3 (three) times daily with meals.         Physical Examination  Filed Vitals:   06/15/11 1113  BP: 150/80  Pulse: 90  Temp: 98.3 F (36.8 C)    Body mass index is 31.66 kg/(m^2).  General:  WDWN in NAD Gait: Normal HEENT: WNL Eyes: Pupils equal Pulmonary: normal non-labored breathing , without Rales, rhonchi,  wheezing Cardiac: RRR, without  Murmurs, rubs or gallops; No carotid bruits Abdomen: soft, NT, no masses Skin: no rashes, ulcers  noted Vascular Exam/Pulses: Patient has 2+ femoral pulses, PT and DP pulses bilaterally. He also has 2+ radial pulses  Extremities without ischemic changes, no Gangrene , no cellulitis; no open wounds;  Musculoskeletal: no muscle wasting or atrophy  Neurologic: A&O X 3; Appropriate Affect ; SENSATION: normal; MOTOR FUNCTION:  moving all extremities equally. Speech is fluent/normal  Non-Invasive Vascular Imaging: Lower extremity arterial duplex today shows that the stent is open and patent at the distal end of the stent there is some increased velocity of 225 and Michael [name] is aware of this.  ASSESSMENT/PLAN: Assessment as above, Michael [name] spoke with the patient after I saw him. There is a anticoagulant study going on that Michael [name] spoke to the patient about taking part in. The patient agrees and the group from [place] research will contact the patient regarding this. The patient returns in 6 months for lower extremity arterial duplex. He will followup with [name] His questions were encouraged and answered.  Michael [name] ANP  Clinic M.D.: Myra [name]

## 2011-06-15 NOTE — Procedures (Unsigned)
LOWER EXTREMITY ARTERIAL DUPLEX  INDICATION:  Peripheral vascular disease  HISTORY: Diabetes:  Yes Cardiac:  No Hypertension:  Yes Smoking:  Previous chewing tobacco, no cigarettes Previous Surgery:  Right superficial femoral artery/popliteal artery stent 11/05/2010  SINGLE LEVEL ARTERIAL EXAM                         RIGHT                LEFT Brachial: Anterior tibial: Posterior tibial: Peroneal: Ankle/Brachial Index:   0.86                 1.03   PREVIOUS ABI:  Date:  10/27/2010  RIGHT:  0.44  LEFT:  0.87.  LOWER EXTREMITY ARTERIAL DUPLEX EXAM  DUPLEX:  Diffuse heterogeneous plaque present involving the right common femoral, profunda femoral and superficial femoral arteries. Elevated velocity present involving the right profunda femoral artery suggestive of 30%-49% stenosis. Elevated velocity present with a ratio of 4.2 in the distal thigh segment of the superficial femoral artery stent suggesting >75% stenosis.  IMPRESSION: 1. Right native artery stenosis and in-stent stenosis present as     listed above. 2. Right ankle brachial index is in the mild claudication range. 3. Left ankle brachial index is in the normal range.  ___________________________________________ V. Charlena Cross, MD  SH/MEDQ  D:  06/15/2011  T:  06/15/2011  Job:  [id number]

## 2011-06-15 NOTE — Progress Notes (Signed)
ABI performed @ [facility] on 06/15/2011

## 2011-06-16 ENCOUNTER — Other Ambulatory Visit: Payer: Self-pay | Admitting: Cardiovascular Disease

## 2011-06-17 ENCOUNTER — Telehealth: Payer: Self-pay

## 2011-06-17 DIAGNOSIS — I739 Peripheral vascular disease, unspecified: Secondary | ICD-10-CM

## 2011-06-17 NOTE — Telephone Encounter (Signed)
This is for the [employer].

## 2011-06-19 ENCOUNTER — Ambulatory Visit (HOSPITAL_COMMUNITY)
Admission: RE | Admit: 2011-06-19 | Discharge: 2011-06-19 | Disposition: A | Discharge status: Home / Self Care | Payer: Self-pay | Source: Ambulatory Visit | Attending: Cardiovascular Disease | Admitting: Cardiovascular Disease

## 2011-06-19 DIAGNOSIS — I739 Peripheral vascular disease, unspecified: Secondary | ICD-10-CM

## 2011-06-19 DIAGNOSIS — Z006 Encounter for examination for normal comparison and control in clinical research program: Secondary | ICD-10-CM | POA: Insufficient documentation

## 2011-06-19 NOTE — Progress Notes (Signed)
VASCULAR LAB PRELIMINARY  ARTERIAL  ABI completed:    RIGHT    LEFT    PRESSURE WAVEFORM  PRESSURE WAVEFORM  BRACHIAL 155 Tri  BRACHIAL 131 Tri  DP 144 Mono DP 175 Tri   AT   AT    PT 146 Bi  PT 170 Tri   PER   PER    GREAT TOE  NA GREAT TOE  NA    RIGHT LEFT  ABI 0.94 1.13     Farrel Demark, RDMS 06/19/2011, 12:09 PM

## 2011-06-24 ENCOUNTER — Encounter: Payer: Self-pay | Admitting: *Deleted

## 2011-08-28 ENCOUNTER — Other Ambulatory Visit: Payer: Self-pay | Admitting: *Deleted

## 2011-12-02 ENCOUNTER — Other Ambulatory Visit: Payer: Self-pay | Admitting: Orthopaedic Surgery

## 2011-12-02 DIAGNOSIS — M1990 Unspecified osteoarthritis, unspecified site: Secondary | ICD-10-CM

## 2011-12-02 DIAGNOSIS — M25569 Pain in unspecified knee: Secondary | ICD-10-CM

## 2011-12-02 DIAGNOSIS — R531 Weakness: Secondary | ICD-10-CM

## 2011-12-02 DIAGNOSIS — R52 Pain, unspecified: Secondary | ICD-10-CM

## 2011-12-21 ENCOUNTER — Ambulatory Visit: Payer: PRIVATE HEALTH INSURANCE | Admitting: Surgery

## 2011-12-25 ENCOUNTER — Ambulatory Visit (HOSPITAL_COMMUNITY)
Admission: RE | Admit: 2011-12-25 | Discharge: 2011-12-25 | Disposition: A | Discharge status: Home / Self Care | Payer: Self-pay | Source: Ambulatory Visit | Attending: Cardiovascular Disease | Admitting: Cardiovascular Disease

## 2011-12-25 DIAGNOSIS — I70209 Unspecified atherosclerosis of native arteries of extremities, unspecified extremity: Secondary | ICD-10-CM | POA: Insufficient documentation

## 2011-12-25 DIAGNOSIS — I739 Peripheral vascular disease, unspecified: Secondary | ICD-10-CM

## 2011-12-25 NOTE — Progress Notes (Signed)
VASCULAR LAB PRELIMINARY  ARTERIAL  ABI completed:    RIGHT    LEFT    PRESSURE WAVEFORM  PRESSURE WAVEFORM  BRACHIAL 171 T BRACHIAL 160 M  DP 93 M DP 178 M  AT   AT    PT 158 M PT 190 T  PER   PER    GREAT TOE  NA GREAT TOE  NA    RIGHT LEFT  ABI 0.92 >1.0     Makinsey Pepitone, 12/25/2011, 11:51 AM

## 2012-01-04 ENCOUNTER — Other Ambulatory Visit: Payer: Self-pay | Admitting: *Deleted

## 2012-01-04 MED ORDER — AMBULATORY NON FORMULARY MEDICATION: Status: DC

## 2012-02-03 ENCOUNTER — Telehealth: Payer: Self-pay | Admitting: Surgery

## 2012-02-03 NOTE — Telephone Encounter (Signed)
Message copied by Fredrich Birks on Wed Feb 03, 2012 12:12 PM ------      Message from: Lamar Blinks S      Created: Wed Feb 03, 2012  9:30 AM      Regarding: needs appt. w/ Dr. Myra Gianotti      Contact: [phone]       Pt. Requesting appt. With Dr. Myra Gianotti.  Is established pt.  S/p right Fem. And  Pop art stent 11/2010.  Having symptoms with tingling in face and bilateral forearms and tip of ring finger.  Wants to discuss w/ [facility].  Feels sx's r/t drug study that he is involved in.  Has been eval per [facility], and told to see vasc. Surgeon again.  Will wait on vasc. Studies, until [facility] sees pt.  Pt. Really wants to try to get in this year.  Any poss. Of sched. Him 12/9 or 12/23?  Cell phone # [phone].

## 2012-02-03 NOTE — Telephone Encounter (Signed)
Spoke with patient re: appointment with [facility] on 02/22/12- pt will be here at 8:30am, dpm

## 2012-02-19 ENCOUNTER — Encounter: Payer: Self-pay | Admitting: Surgery

## 2012-02-22 ENCOUNTER — Encounter (INDEPENDENT_AMBULATORY_CARE_PROVIDER_SITE_OTHER): Payer: No Typology Code available for payment source | Admitting: *Deleted

## 2012-02-22 ENCOUNTER — Ambulatory Visit (INDEPENDENT_AMBULATORY_CARE_PROVIDER_SITE_OTHER): Payer: No Typology Code available for payment source | Admitting: Surgery

## 2012-02-22 ENCOUNTER — Encounter: Payer: Self-pay | Admitting: Surgery

## 2012-02-22 VITALS — BP 157/82 | HR 96 | Resp 16 | Ht 71.0 in | Wt 226.0 lb

## 2012-02-22 DIAGNOSIS — I739 Peripheral vascular disease, unspecified: Secondary | ICD-10-CM

## 2012-02-22 DIAGNOSIS — Z48812 Encounter for surgical aftercare following surgery on the circulatory system: Secondary | ICD-10-CM

## 2012-02-22 NOTE — Progress Notes (Signed)
[facility] [place]   Patient name: Michael [name] MRN: [id number] DOB: 18-Nov-1950 Sex: male     Chief Complaint  Patient presents with  . Re-evaluation    tingling in face and bilateral forearms and tip of ring finger, ulcerations on forearms also c/o SOB and pain in chest, pt feels like it is related to drug study    HISTORY OF PRESENT ILLNESS: The patient is back today for followup. He is status post stenting of his right superficial femoral artery in September of 2012. This was done for lifestyle limiting claudication. He has not had any difficulty since that time. He has had a dramatic improvement in his ability to ambulate. I put him on theEuclid trial. He has had some symptoms which we do not feel are related to the drug. He complains of face and elbow tingling. He also notes scabbing of both arms.    Past Medical History  Diagnosis Date  . Diabetes mellitus   . Hypertension   . Hyperlipidemia   . Anxiety   . Allergy     Past Surgical History  Procedure Date  . Rotator cuff repair     1990  . Knee arthroscopy   . Angioplasty / stenting femoral 11/05/10    Left SFA stent    History   Social History  . Marital Status: Married    Spouse Name: N/A    Number of Children: N/A  . Years of Education: N/A   Occupational History  . Not on file.   Social History Main Topics  . Smoking status: Never Smoker   . Smokeless tobacco: Former Neurosurgeon    Quit date: 10/26/1981  . Alcohol Use: 0.0 oz/week     Comment: occasional drink  . Drug Use: No  . Sexually Active: Not on file   Other Topics Concern  . Not on file   Social History Narrative  . No narrative on file    Family History  Problem Relation Age of Onset  . Heart disease Father     Allergies as of 02/22/2012 - Review Complete 02/22/2012  Allergen Reaction Noted  . Demerol  10/27/2010  . Penicillins  10/27/2010    Current Outpatient Prescriptions on File Prior to Visit   Medication Sig Dispense Refill  . AMBULATORY NON FORMULARY MEDICATION Medication Name: EUCLID STUDY DRUG-Plavix vs Brilinta      . amLODipine (NORVASC) 10 MG tablet Take 10 mg by mouth daily.        . benazepril (LOTENSIN) 40 MG tablet Take 40 mg by mouth daily.        . clonazePAM (KLONOPIN) 0.5 MG tablet Take 0.5 mg by mouth at bedtime as needed.       . hydrochlorothiazide 25 MG tablet Take 25 mg by mouth daily. One half daily        . insulin aspart (NOVOLOG) 100 UNIT/ML injection Inject 100 Units into the skin 3 (three) times daily before meals.        . insulin glargine (LANTUS) 100 UNIT/ML injection Inject 100 Units into the skin at bedtime.        . metFORMIN (GLUMETZA) 1000 MG (MOD) 24 hr tablet Take 1,000 mg by mouth daily with breakfast.        . nabumetone (RELAFEN) 500 MG tablet Take 500 mg by mouth 2 (two) times daily.        . rosuvastatin (CRESTOR) 10 MG tablet Take 10 mg by mouth daily.        Marland Kitchen  tadalafil (CIALIS) 20 MG tablet Take 20 mg by mouth daily as needed.        Marland Kitchen ibuprofen (ADVIL,MOTRIN) 200 MG tablet Take 200 mg by mouth every 6 (six) hours as needed.        . pentoxifylline (TRENTAL) 400 MG CR tablet Take 400 mg by mouth 3 (three) times daily with meals.       . sildenafil (VIAGRA) 50 MG tablet Take 50 mg by mouth daily as needed.           REVIEW OF SYSTEMS: Cardiovascular: No chest pain, chest pressure, palpitations, orthopnea, or dyspnea on exertion. No claudication or rest pain,   Pulmonary: No productive cough, asthma or wheezing. Neurologic: Numbness on face and elbows  Hematologic: Reports bleeding from scabs over his bilateral forearms  Musculoskeletal: No joint pain or joint swelling. Gastrointestinal: No blood in stool or hematemesis Genitourinary: No dysuria or hematuria. Psychiatric:: No history of major depression. Integumentary: small scabs on bilateral forearm  Constitutional: No fever or chills.  PHYSICAL EXAMINATION:   Vital signs are BP  157/82  Pulse 96  Resp 16  Ht 5\' 11"  (1.803 m)  Wt 226 lb (102.513 kg)  BMI 31.52 kg/m2  SpO2 99% General: The patient appears their stated age. HEENT:  No gross abnormalities Pulmonary:  Non labored breathing Musculoskeletal: There are no major deformities. Neurologic: No focal weakness or paresthesias are detected, Skin: small scabbed over areas on both hands which extend up to the elbow bilaterally. No surrounding erythema  Psychiatric: The patient has normal affect. Cardiovascular: There is a regular rate and rhythm without significant murmur appreciated.Palpable left dorsalis pedis pulse. I cannot palpate right dorsalis pedis.    Diagnostic Studies Duplex ultrasound was ordered interviewed today. This shows an ABI of 0.99 on the right with triphasic waveforms. It is 1.3 on the left with triphasic waveforms. There is an area of increased velocity in the right femoral stent. Peak velocity is 2 90 cm/s   Assessment: Status post right superficial femoral artery stenting.  Plan: With regards to the patient's possible drug reaction, he will take a drug holiday, in anticipation of cataract surgery in one week. I have also spoken to the study coordinator who will contact him. We do not feel that the numbness and tingling is a result of the drug. However, the bleeding areas on the hands could be.  The patient will come back in 6 months for a repeat ultrasound. I will need to pay particular attention to the area of increased velocities within the right superficial femoral stent. If it increases to greater than 300, I will consider angiography  V. Charlena Cross, M.D. [facility] [place] Office: [phone] Pager:  [phone]

## 2012-02-22 NOTE — Addendum Note (Signed)
Addended by: Sharee Pimple on: 02/22/2012 02:06 PM   Modules accepted: Orders

## 2012-06-20 ENCOUNTER — Encounter: Payer: Self-pay | Admitting: *Deleted

## 2012-08-22 ENCOUNTER — Ambulatory Visit: Payer: Self-pay | Admitting: Surgery

## 2012-08-22 ENCOUNTER — Encounter (INDEPENDENT_AMBULATORY_CARE_PROVIDER_SITE_OTHER): Payer: No Typology Code available for payment source | Admitting: Vascular Surgery

## 2012-08-22 DIAGNOSIS — Z48812 Encounter for surgical aftercare following surgery on the circulatory system: Secondary | ICD-10-CM

## 2012-08-22 DIAGNOSIS — I739 Peripheral vascular disease, unspecified: Secondary | ICD-10-CM

## 2012-08-22 NOTE — Progress Notes (Unsigned)
Right lower extremity arterial duplex performed @ [facility] 08/22/2012 as well as ABI .

## 2012-08-23 ENCOUNTER — Other Ambulatory Visit: Payer: Self-pay

## 2012-08-23 DIAGNOSIS — I739 Peripheral vascular disease, unspecified: Secondary | ICD-10-CM

## 2012-08-23 DIAGNOSIS — Z48812 Encounter for surgical aftercare following surgery on the circulatory system: Secondary | ICD-10-CM

## 2012-08-24 ENCOUNTER — Encounter: Payer: Self-pay | Admitting: Surgery

## 2012-11-25 ENCOUNTER — Encounter: Payer: Self-pay | Admitting: Surgery

## 2012-11-28 ENCOUNTER — Ambulatory Visit: Payer: PRIVATE HEALTH INSURANCE | Admitting: Surgery

## 2012-11-28 ENCOUNTER — Ambulatory Visit (HOSPITAL_COMMUNITY)
Admission: RE | Admit: 2012-11-28 | Discharge: 2012-11-28 | Disposition: A | Discharge status: Home / Self Care | Payer: No Typology Code available for payment source | Source: Ambulatory Visit | Attending: Surgery | Admitting: Surgery

## 2012-11-28 DIAGNOSIS — Z48812 Encounter for surgical aftercare following surgery on the circulatory system: Secondary | ICD-10-CM

## 2012-11-28 DIAGNOSIS — I739 Peripheral vascular disease, unspecified: Secondary | ICD-10-CM

## 2013-01-13 ENCOUNTER — Encounter: Payer: Self-pay | Admitting: Interventional Cardiology

## 2013-01-13 ENCOUNTER — Ambulatory Visit (INDEPENDENT_AMBULATORY_CARE_PROVIDER_SITE_OTHER): Payer: No Typology Code available for payment source | Admitting: Interventional Cardiology

## 2013-01-13 VITALS — BP 129/63 | HR 77 | Ht 71.0 in | Wt 220.0 lb

## 2013-01-13 DIAGNOSIS — R0602 Shortness of breath: Secondary | ICD-10-CM

## 2013-01-13 DIAGNOSIS — I739 Peripheral vascular disease, unspecified: Secondary | ICD-10-CM

## 2013-01-13 DIAGNOSIS — R079 Chest pain, unspecified: Secondary | ICD-10-CM

## 2013-01-13 DIAGNOSIS — I10 Essential (primary) hypertension: Secondary | ICD-10-CM | POA: Insufficient documentation

## 2013-01-13 NOTE — Patient Instructions (Addendum)
Your physician recommends that you continue on your current medications as directed. Please refer to the Current Medication list given to you today.  Your physician has requested that you have en exercise stress myoview. For further information please visit [email or web address]. Please follow instruction sheet, as given.  Your physician wants you to follow-up in: 1 year with Dr. Varanasi.  You will receive a reminder letter in the mail two months in advance. If you don't receive a letter, please call our office to schedule the follow-up appointment.  

## 2013-01-13 NOTE — Progress Notes (Signed)
Patient ID: Michael [name], male   DOB: 26-Feb-1951, 62 y.o.   MRN: [id number]    [address] [place], [place]  [address] Phone: [phone] Fax:  [phone]  Date:  01/13/2013   ID:  Michael [name], DOB 03/15/1950, MRN [id number]  PCP:  Michael Rua, MD      History of Present Illness: Michael [name] is a 62 y.o. male who had a PV angiogram and right SFA stent in 2012. He had a left groin pseudoaneurysm. His right leg claudication is improved after the stent. He has been walking, without claudication. He has been in a study. He has some numbness and some skin issues since being in the study. He has had some CP after a URI with a lot of coughing. Worse with deep breathing.  He has some CP when waking up and thinks this is positional.  His blood sugars are better.   He has some SHOB with walking on the treadmill daily, or with walking up stairs.He has some chest pain when he gets to the top of the stairs.  It feels like a dull ache.  He has been walking stairs for exercise as well.       Wt Readings from Last 3 Encounters:  01/13/13 220 lb (99.791 kg)  02/22/12 226 lb (102.513 kg)  06/15/11 227 lb (102.967 kg)     Past Medical History  Diagnosis Date  . Diabetes mellitus   . Hypertension   . Hyperlipidemia   . Anxiety   . Allergy     Current Outpatient Prescriptions  Medication Sig Dispense Refill  . amLODipine (NORVASC) 10 MG tablet Take 10 mg by mouth daily.        . benazepril (LOTENSIN) 40 MG tablet Take 40 mg by mouth daily.        . Canagliflozin (INVOKANA) 300 MG TABS Take by mouth.      . clopidogrel (PLAVIX) 75 MG tablet Take 75 mg by mouth daily with breakfast.      . Cyanocobalamin (VITAMIN B 12 PO) Take by mouth.      . Diclofenac Sodium (VOLTAREN PO) Take by mouth.      . insulin aspart (NOVOLOG) 100 UNIT/ML injection Inject 100 Units into the skin 3 (three) times daily before meals.        . insulin glargine (LANTUS) 100 UNIT/ML  injection Inject 100 Units into the skin at bedtime.        . metFORMIN (GLUMETZA) 1000 MG (MOD) 24 hr tablet Take 1,000 mg by mouth daily with breakfast.        . olopatadine (PATANOL) 0.1 % ophthalmic solution 1 drop 2 (two) times daily.      . rosuvastatin (CRESTOR) 10 MG tablet Take 10 mg by mouth daily.         No current facility-administered medications for this visit.    Allergies:    Allergies  Allergen Reactions  . Actos [Pioglitazone]   . Byetta 10 Mcg Pen [Exenatide]   . Demerol   . Glimepiride     Edema   . Nabumetone   . Oxycodone   . Penicillins     Social History:  The patient  reports that he has never smoked. He quit smokeless tobacco use about 31 years ago. He reports that he drinks alcohol. He reports that he does not use illicit drugs.   Family History:  The patient's family history includes Heart disease in his father.  ROS:  Please see the history of present illness.  No nausea, vomiting.  No fevers, chills.  No focal weakness.  No dysuria.    All other systems reviewed and negative.   PHYSICAL EXAM: VS:  BP 129/63  Pulse 77  Ht 5\' 11"  (1.803 m)  Wt 220 lb (99.791 kg)  BMI 30.70 kg/m2 Well nourished, well developed, in no acute distress HEENT: normal Neck: no JVD, no carotid bruits Cardiac:  normal S1, S2; RRR;  Lungs:  clear to auscultation bilaterally, no wheezing, rhonchi or rales Abd: soft, nontender, no hepatomegaly Ext: no edema Skin: warm and dry Neuro:   no focal abnormalities noted  EKG:  NSR, nonspecific ST segment changes     ASSESSMENT AND PLAN:  Essential hypertension, benign  Continue Amlodipine Besylate Tablet, 10 MG, 1 tablet, Orally, Once a day Continue Benazepril HCl Tablet, 40 MG, 1 tablet, Orally, Once a day Diagnostic Imaging:EKG Michael [name] 02/23/2012 10:00:41 AM > Michael [name] 02/23/2012 10:22:08 AM > NSR, non specific ST segment changes  continue regular exercise and attempt to weight loss. He should also minimize  salt in the diet. If blood pressure remains elevated, could consider switching benazepril to a more potent medication like irbesartan 300 mg daily. check blood pressures at home.   HCTZ was stopped. He is lost a significant amount of weight as well. I encouraged him to continue his current lifestyle modifications.  2. Peripheral vascular disease  he reports that ultrasound shows some restenosis of his right SFA stent. no significant claudication.  F/u with u/s with [name]  He had an SFA velocity of 280 cm/sec. 3. Shortness of breath  Diagnostic Imaging:EC Echocardiogram (Ordered for 02/23/2012) Michael [name] 02/26/2012 01:12:59 PM > Echo report charted. Michael [name] 02/26/2012 03:57:14 PM > Pt notified. , EC Echocardiogram showed LVH but normal LV function.  4. Chest pain:  Some exertional component.  Given PAD and DM, would check cardiolite. He feels that he can walk the treadmill.  Signed, Fredric Mare, MD, [facility] 01/13/2013 10:01 AM

## 2013-01-25 ENCOUNTER — Ambulatory Visit (HOSPITAL_COMMUNITY): Payer: PRIVATE HEALTH INSURANCE | Attending: Cardiovascular Disease | Admitting: Radiology

## 2013-01-25 ENCOUNTER — Encounter: Payer: Self-pay | Admitting: Cardiovascular Disease

## 2013-01-25 ENCOUNTER — Encounter: Payer: Self-pay | Admitting: Surgery

## 2013-01-25 VITALS — BP 159/90 | HR 82 | Ht 71.0 in | Wt 217.0 lb

## 2013-01-25 DIAGNOSIS — R079 Chest pain, unspecified: Secondary | ICD-10-CM | POA: Insufficient documentation

## 2013-01-25 DIAGNOSIS — E785 Hyperlipidemia, unspecified: Secondary | ICD-10-CM | POA: Insufficient documentation

## 2013-01-25 DIAGNOSIS — R0602 Shortness of breath: Secondary | ICD-10-CM | POA: Insufficient documentation

## 2013-01-25 DIAGNOSIS — Z794 Long term (current) use of insulin: Secondary | ICD-10-CM | POA: Insufficient documentation

## 2013-01-25 DIAGNOSIS — Z8249 Family history of ischemic heart disease and other diseases of the circulatory system: Secondary | ICD-10-CM | POA: Insufficient documentation

## 2013-01-25 DIAGNOSIS — I10 Essential (primary) hypertension: Secondary | ICD-10-CM | POA: Insufficient documentation

## 2013-01-25 DIAGNOSIS — I739 Peripheral vascular disease, unspecified: Secondary | ICD-10-CM | POA: Insufficient documentation

## 2013-01-25 DIAGNOSIS — E119 Type 2 diabetes mellitus without complications: Secondary | ICD-10-CM | POA: Insufficient documentation

## 2013-01-25 MED ORDER — TECHNETIUM TC 99M SESTAMIBI GENERIC - CARDIOLITE
30.0000 | Freq: Once | INTRAVENOUS | Status: AC | PRN
  Administered 2013-01-25: 30 via INTRAVENOUS

## 2013-01-25 MED ORDER — TECHNETIUM TC 99M SESTAMIBI GENERIC - CARDIOLITE
10.0000 | Freq: Once | INTRAVENOUS | Status: AC | PRN
  Administered 2013-01-25: 10 via INTRAVENOUS

## 2013-01-25 NOTE — Progress Notes (Signed)
[facility] SITE 3 NUCLEAR MED [address] [place], [place] [address] [phone]    Cardiology Nuclear Med Study  Michael [name] is a 62 y.o. male     MRN : [id number]     DOB: December 22, 1950  Procedure Date: 01/25/2013  Nuclear Med Background Indication for Stress Test:  Evaluation for Ischemia History:  No known CAD, Echo 2013 EF 60-65%, MPI ~8 yrs ago Cardiac Risk Factors: Family History - CAD, Hypertension, IDDM, Lipids and PVD  Symptoms:  Chest Pain, Chest Pain with Exertion and SOB   Nuclear Pre-Procedure Caffeine/Decaff Intake:  None > 12 hrs NPO After: 6:30am   Lungs:  clear O2 Sat: 97% on room air. IV 0.9% NS with Angio Cath:  20g  IV Site: R Wrist x 1, tolerated well IV Started by:  Irean Hong, RN  Chest Size (in):  48 Cup Size: n/a  Height: 5\' 11"  (1.803 m)  Weight:  217 lb (98.431 kg)  BMI:  Body mass index is 30.28 kg/(m^2). Tech Comments:  Fasting CBG was 140 at 06:30 today with 1/2 dose of Novolog  Insulin taken with breakfast per patient. Irean Hong, RN.    Nuclear Med Study 1 or 2 day study: 1 day  Stress Test Type:  Stress  Reading MD: Lance Muss, MD  Order Authorizing Provider:  Lance Muss, MD  Resting Radionuclide: Technetium 40m Sestamibi  Resting Radionuclide Dose: 11.0 mCi   Stress Radionuclide:  Technetium 68m Sestamibi  Stress Radionuclide Dose: 33.0 mCi           Stress Protocol Rest HR: 82 Stress HR: 148  Rest BP: 159/90 Stress BP: 223/103  Exercise Time (min): 6:40 METS: 8.0   Predicted Max HR: 157 bpm % Max HR: 94.27 bpm Rate Pressure Product: 21308   Dose of Adenosine (mg):  n/a Dose of Lexiscan: n/a mg  Dose of Atropine (mg): n/a Dose of Dobutamine: n/a mcg/kg/min (at max HR)  Stress Test Technologist: Nelson Chimes, BS-ES  Nuclear Technologist:  Dario Guardian, CNMT     Rest Procedure:  Myocardial perfusion imaging was performed at rest 45 minutes following the intravenous administration of Technetium  53m Sestamibi. Rest ECG: NSR with non-specific ST-T wave changes  Stress Procedure:  The patient exercised on the treadmill utilizing the Bruce Protocol for 6:40 minutes. The patient stopped due to fatigue and denied any chest pain.  Technetium 41m Sestamibi was injected at peak exercise and myocardial perfusion imaging was performed after a brief delay. Stress ECG: No significant change from baseline ECG  QPS Raw Data Images:  Normal; no motion artifact; normal heart/lung ratio. Stress Images:  Normal homogeneous uptake in all areas of the myocardium. Rest Images:  There is decreased uptake in the inferior wall. Subtraction (SDS):  No evidence of ischemia. Transient Ischemic Dilatation (Normal <1.22):  0.84 Lung/Heart Ratio (Normal <0.45):  0.21  Quantitative Gated Spect Images QGS EDV:  62 ml QGS ESV:  19 ml  Impression Exercise Capacity:  Good exercise capacity. BP Response:  Hypertensive blood pressure response. Clinical Symptoms:  There is dyspnea. ECG Impression:  Nonspecific ST segment changes from baseline persisted. No change from baseline. Comparison with Prior Nuclear Study: No images to compare  Overall Impression:  Normal stress nuclear study.  LV Ejection Fraction: 70%.  LV Wall Motion:  NL LV Function; NL Wall Motion  Corky Crafts., MD, [facility]

## 2013-01-30 ENCOUNTER — Telehealth: Payer: Self-pay | Admitting: Interventional Cardiology

## 2013-01-30 ENCOUNTER — Encounter (HOSPITAL_COMMUNITY): Payer: No Typology Code available for payment source

## 2013-01-30 ENCOUNTER — Other Ambulatory Visit (HOSPITAL_COMMUNITY): Payer: No Typology Code available for payment source

## 2013-01-30 ENCOUNTER — Ambulatory Visit: Payer: No Typology Code available for payment source | Admitting: Surgery

## 2013-01-30 NOTE — Telephone Encounter (Signed)
New message    Want nuclear medicine stress test results-

## 2013-01-30 NOTE — Telephone Encounter (Signed)
Called pt, no answer no vm

## 2013-01-31 NOTE — Telephone Encounter (Signed)
Called pt with results.

## 2013-02-02 ENCOUNTER — Other Ambulatory Visit: Payer: Self-pay | Admitting: Family Medicine

## 2013-02-02 DIAGNOSIS — M545 Low back pain: Secondary | ICD-10-CM

## 2013-02-11 ENCOUNTER — Ambulatory Visit
Admission: RE | Admit: 2013-02-11 | Discharge: 2013-02-11 | Disposition: A | Discharge status: Home / Self Care | Payer: PRIVATE HEALTH INSURANCE | Source: Ambulatory Visit | Attending: Family Medicine | Admitting: Family Medicine

## 2013-02-11 DIAGNOSIS — M545 Low back pain: Secondary | ICD-10-CM

## 2013-02-14 ENCOUNTER — Ambulatory Visit: Payer: Self-pay | Admitting: Interventional Cardiology

## 2013-02-22 ENCOUNTER — Encounter: Payer: Self-pay | Admitting: Surgery

## 2013-02-27 ENCOUNTER — Ambulatory Visit (HOSPITAL_COMMUNITY)
Admission: RE | Admit: 2013-02-27 | Discharge: 2013-02-27 | Disposition: A | Discharge status: Home / Self Care | Payer: PRIVATE HEALTH INSURANCE | Source: Ambulatory Visit | Attending: Surgery | Admitting: Surgery

## 2013-02-27 ENCOUNTER — Telehealth: Payer: Self-pay | Admitting: Interventional Cardiology

## 2013-02-27 ENCOUNTER — Ambulatory Visit (INDEPENDENT_AMBULATORY_CARE_PROVIDER_SITE_OTHER): Payer: No Typology Code available for payment source | Admitting: Surgery

## 2013-02-27 ENCOUNTER — Ambulatory Visit (INDEPENDENT_AMBULATORY_CARE_PROVIDER_SITE_OTHER)
Admission: RE | Admit: 2013-02-27 | Discharge: 2013-02-27 | Disposition: A | Discharge status: Home / Self Care | Payer: PRIVATE HEALTH INSURANCE | Source: Ambulatory Visit | Attending: Surgery | Admitting: Surgery

## 2013-02-27 ENCOUNTER — Encounter: Payer: Self-pay | Admitting: Surgery

## 2013-02-27 VITALS — BP 129/77 | HR 95 | Ht 71.0 in | Wt 214.0 lb

## 2013-02-27 DIAGNOSIS — I739 Peripheral vascular disease, unspecified: Secondary | ICD-10-CM

## 2013-02-27 DIAGNOSIS — Z09 Encounter for follow-up examination after completed treatment for conditions other than malignant neoplasm: Secondary | ICD-10-CM | POA: Insufficient documentation

## 2013-02-27 DIAGNOSIS — Z48812 Encounter for surgical aftercare following surgery on the circulatory system: Secondary | ICD-10-CM

## 2013-02-27 NOTE — Telephone Encounter (Signed)
Follow up    Pt would like to speak to Dr Eldridge Dace please about  Dr Michaelyn Barter appointment today.  Pt has questions about his Medications.

## 2013-02-27 NOTE — Telephone Encounter (Signed)
lmtrc

## 2013-03-03 NOTE — Telephone Encounter (Signed)
Spoke with pt and he just wanted me to go over nuclear stress test results with him again. Pt notified.

## 2013-03-10 NOTE — Progress Notes (Signed)
Patient left without being seen.

## 2013-03-13 ENCOUNTER — Other Ambulatory Visit: Payer: Self-pay | Admitting: Surgery

## 2013-03-13 DIAGNOSIS — I739 Peripheral vascular disease, unspecified: Secondary | ICD-10-CM

## 2013-03-13 DIAGNOSIS — Z48812 Encounter for surgical aftercare following surgery on the circulatory system: Secondary | ICD-10-CM

## 2013-03-23 ENCOUNTER — Other Ambulatory Visit: Payer: Self-pay | Admitting: *Deleted

## 2013-03-23 ENCOUNTER — Encounter: Payer: Self-pay | Admitting: Surgery

## 2013-03-23 MED ORDER — AMBULATORY NON FORMULARY MEDICATION: Status: DC

## 2013-09-13 ENCOUNTER — Encounter (HOSPITAL_COMMUNITY): Payer: Self-pay

## 2013-09-15 ENCOUNTER — Ambulatory Visit (HOSPITAL_COMMUNITY)
Admission: RE | Admit: 2013-09-15 | Discharge: 2013-09-15 | Disposition: A | Discharge status: Home / Self Care | Payer: No Typology Code available for payment source | Source: Ambulatory Visit | Attending: Surgery | Admitting: Surgery

## 2013-09-15 DIAGNOSIS — I739 Peripheral vascular disease, unspecified: Secondary | ICD-10-CM | POA: Diagnosis not present

## 2013-09-22 ENCOUNTER — Encounter: Payer: Self-pay | Admitting: Surgery

## 2013-09-25 ENCOUNTER — Ambulatory Visit: Payer: Self-pay | Admitting: Surgery

## 2013-10-20 ENCOUNTER — Encounter: Payer: Self-pay | Admitting: Surgery

## 2013-10-23 ENCOUNTER — Encounter: Payer: Self-pay | Admitting: Surgery

## 2013-10-23 ENCOUNTER — Ambulatory Visit (INDEPENDENT_AMBULATORY_CARE_PROVIDER_SITE_OTHER): Payer: PRIVATE HEALTH INSURANCE | Admitting: Surgery

## 2013-10-23 VITALS — BP 153/91 | HR 98 | Ht 71.0 in | Wt 215.0 lb

## 2013-10-23 DIAGNOSIS — Z48812 Encounter for surgical aftercare following surgery on the circulatory system: Secondary | ICD-10-CM | POA: Diagnosis not present

## 2013-10-23 DIAGNOSIS — I739 Peripheral vascular disease, unspecified: Secondary | ICD-10-CM

## 2013-10-23 NOTE — Progress Notes (Signed)
Patient name: Michael [name] MRN: [id number] DOB: 10/03/1950 Sex: male     Chief Complaint  Patient presents with  . Re-evaluation    6 month f/u     HISTORY OF PRESENT ILLNESS: The patient is back today for followup.  He Is a 63 year old male patient who was initially seen by Dr. Kellie Simmering for right leg claudication.  I subsequently placed a right superficial femoral artery stent in September 2012.  The patient is done very well since that time.  On one of his early ultrasounds there was a velocity of 280 within the stent.  He reports walking approximately 2 miles a day with no signs of claudication.  Past Medical History  Diagnosis Date  . Diabetes mellitus   . Hypertension   . Hyperlipidemia   . Anxiety   . Allergy     Past Surgical History  Procedure Laterality Date  . Rotator cuff repair      1990  . Knee arthroscopy    . Angioplasty / stenting femoral  11/05/10    Left SFA stent    History   Social History  . Marital Status: Married    Spouse Name: N/A    Number of Children: N/A  . Years of Education: N/A   Occupational History  . Not on file.   Social History Main Topics  . Smoking status: Never Smoker   . Smokeless tobacco: Former Systems developer    Quit date: 10/26/1981  . Alcohol Use: 0.0 oz/week     Comment: occasional drink  . Drug Use: No  . Sexual Activity: Not on file   Other Topics Concern  . Not on file   Social History Narrative  . No narrative on file    Family History  Problem Relation Age of Onset  . Heart disease Father   . Deep vein thrombosis Father   . Hyperlipidemia Father   . Hypertension Father   . Heart attack Father   . Peripheral vascular disease Father   . Hyperlipidemia Mother   . Hypertension Mother   . Cancer Sister     Allergies as of 10/23/2013 - Review Complete 10/23/2013  Allergen Reaction Noted  . Actos [pioglitazone]  01/13/2013  . Byetta 10 mcg pen [exenatide]  01/13/2013  . Demerol  10/27/2010  .  Glimepiride  01/13/2013  . Nabumetone  01/13/2013  . Oxycodone  01/13/2013  . Penicillins  10/27/2010    Current Outpatient Prescriptions on File Prior to Visit  Medication Sig Dispense Refill  . AMBULATORY NON FORMULARY MEDICATION Medication Name: EUCLID STUDY DRUG-Plavix vs Brilinta      . amLODipine (NORVASC) 10 MG tablet Take 10 mg by mouth daily.        . benazepril (LOTENSIN) 40 MG tablet Take 40 mg by mouth daily.        . Canagliflozin (INVOKANA) 300 MG TABS Take by mouth.      . clopidogrel (PLAVIX) 75 MG tablet Take 75 mg by mouth daily with breakfast.      . Cyanocobalamin (VITAMIN B 12 PO) Take by mouth.      . insulin aspart (NOVOLOG) 100 UNIT/ML injection Inject 12 Units into the skin 3 (three) times daily before meals.       . insulin glargine (LANTUS) 100 UNIT/ML injection Inject 20 Units into the skin at bedtime.       . metFORMIN (GLUMETZA) 1000 MG (MOD) 24 hr tablet Take 1,000 mg by mouth 2 (two) times  daily with a meal.       . olopatadine (PATANOL) 0.1 % ophthalmic solution 1 drop 2 (two) times daily.      . rosuvastatin (CRESTOR) 10 MG tablet Take 10 mg by mouth daily.        . VOLTAREN 1 % GEL Apply 1 application topically daily.       No current facility-administered medications on file prior to visit.     REVIEW OF SYSTEMS: Cardiovascular: No chest pain, chest pressure, palpitations, orthopnea, or dyspnea on exertion. No claudication or rest pain,  No history of DVT or phlebitis. Pulmonary: No productive cough, asthma or wheezing. Neurologic: No weakness, paresthesias, aphasia, or amaurosis. No dizziness. Hematologic: No bleeding problems or clotting disorders. Musculoskeletal: No joint pain or joint swelling. Gastrointestinal: No blood in stool or hematemesis Genitourinary: No dysuria or hematuria. Psychiatric:: No history of major depression. Integumentary: No rashes or ulcers. Constitutional: No fever or chills.  PHYSICAL EXAMINATION:   Vital signs  are BP 153/91  Pulse 98  Ht 5\' 11"  (1.803 m)  Wt 215 lb (97.523 kg)  BMI 30.00 kg/m2  SpO2 100% General: The patient appears their stated age. HEENT:  No gross abnormalities Pulmonary:  Non labored breathing Musculoskeletal: There are no major deformities. Neurologic: No focal weakness or paresthesias are detected, Skin: There are no ulcer or rashes noted. Psychiatric: The patient has normal affect. Cardiovascular:Palpable posterior tibial pulse on the right   Diagnostic Studies ABIs were ordered today.  I have reviewed them they are 1.0 or greater bilaterally with triphasic waveforms  Assessment: Vascular disease with claudication Plan: The patient reports no symptoms of claudication following his right SFA stent.  ABIs today are normal with triphasic waveforms.  The patient does have an ultrasound in the past which showed elevated velocities within the stent.  I feel he needs a duplex of his stent at his next visit to make sure that the velocity profile has not increased.  This will be done in 6 months.  Eldridge Abrahams, M.D. [facility] [place] Office: [phone] Pager:  [phone]

## 2013-10-23 NOTE — Addendum Note (Signed)
Addended by: MCCHESNEY, MARILYN K on: 10/23/2013 03:34 PM   Modules accepted: Orders  

## 2014-01-23 ENCOUNTER — Telehealth: Payer: Self-pay | Admitting: Interventional Cardiology

## 2014-01-30 ENCOUNTER — Ambulatory Visit (INDEPENDENT_AMBULATORY_CARE_PROVIDER_SITE_OTHER): Payer: PRIVATE HEALTH INSURANCE | Admitting: Interventional Cardiology

## 2014-01-30 ENCOUNTER — Encounter: Payer: Self-pay | Admitting: Interventional Cardiology

## 2014-01-30 VITALS — BP 160/84 | HR 100 | Ht 71.0 in | Wt 218.6 lb

## 2014-01-30 DIAGNOSIS — I10 Essential (primary) hypertension: Secondary | ICD-10-CM

## 2014-01-30 NOTE — Progress Notes (Signed)
Patient ID: Michael [name], male   DOB: Mar 09, 1950, 63 y.o.   MRN: [id number] Patient ID: Michael [name], male   DOB: 1950-11-14, 63 y.o.   MRN: [id number]    [address], [address], [place]  [address] Phone: [phone] Fax:  [phone]  Date:  01/30/2014   ID:  Michael [name], DOB [birth year], MRN [id number]  PCP:  Orpah Melter, MD      History of Present Illness: Michael [name] is a 63 y.o. male who had a PV angiogram and right SFA stent in 2012. He had a left groin pseudoaneurysm. His right leg claudication is improved after the stent. He has been walking, without claudication. He has been in a study. He has some numbness and some skin issues since being in the study. He has had some CP after a URI with a lot of coughing. Worse with deep breathing.  He has some CP when waking up and thinks this is positional.  His blood sugars are better.   He has some SHOB with walking on the treadmill daily, or with walking up stairs.He has some chest pain when he gets to the top of the stairs.  It feels like a dull ache.  He has been walking stairs for exercise as well.       Wt Readings from Last 3 Encounters:  01/30/14 218 lb 9.6 oz (99.156 kg)  10/23/13 215 lb (97.523 kg)  02/27/13 214 lb (97.07 kg)     Past Medical History  Diagnosis Date  . Diabetes mellitus   . Hypertension   . Hyperlipidemia   . Anxiety   . Allergy     Current Outpatient Prescriptions  Medication Sig Dispense Refill  . AMBULATORY NON FORMULARY MEDICATION Medication Name: EUCLID STUDY DRUG-Plavix vs Brilinta    . amLODipine (NORVASC) 10 MG tablet Take 10 mg by mouth daily.      . benazepril (LOTENSIN) 40 MG tablet Take 40 mg by mouth daily.      . Canagliflozin (INVOKANA) 300 MG TABS Take by mouth.    . clopidogrel (PLAVIX) 75 MG tablet Take 75 mg by mouth daily with breakfast.    . Cyanocobalamin (VITAMIN B 12 PO) Take by mouth.    . insulin aspart (NOVOLOG) 100 UNIT/ML injection Inject 12  Units into the skin 3 (three) times daily before meals.     . insulin glargine (LANTUS) 100 UNIT/ML injection Inject 20 Units into the skin at bedtime.     . metFORMIN (GLUMETZA) 1000 MG (MOD) 24 hr tablet Take 1,000 mg by mouth 2 (two) times daily with a meal.     . olopatadine (PATANOL) 0.1 % ophthalmic solution 1 drop 2 (two) times daily.    . rosuvastatin (CRESTOR) 10 MG tablet Take 10 mg by mouth daily.      . VOLTAREN 1 % GEL Apply 1 application topically daily.     No current facility-administered medications for this visit.    Allergies:    Allergies  Allergen Reactions  . Actos [Pioglitazone]   . Byetta 10 Mcg Pen [Exenatide]   . Demerol   . Glimepiride     Edema   . Nabumetone   . Oxycodone   . Penicillins     Social History:  The patient  reports that he has never smoked. He quit smokeless tobacco use about 32 years ago. He reports that he drinks alcohol. He reports that he does not use illicit drugs.  Family History:  The patient's family history includes Cancer in his sister; Deep vein thrombosis in his father; Heart attack in his father; Heart disease in his father; Hyperlipidemia in his father and mother; Hypertension in his father and mother; Peripheral vascular disease in his father.   ROS:  Please see the history of present illness.  No nausea, vomiting.  No fevers, chills.  No focal weakness.  No dysuria.    All other systems reviewed and negative.   PHYSICAL EXAM: VS:  BP 160/84 mmHg  Pulse 100  Ht 5\' 11"  (1.803 m)  Wt 218 lb 9.6 oz (99.156 kg)  BMI 30.50 kg/m2 Well nourished, well developed, in no acute distress HEENT: normal Neck: no JVD, no carotid bruits Cardiac:  normal S1, S2; RRR;  Lungs:  clear to auscultation bilaterally, no wheezing, rhonchi or rales Abd: soft, nontender, no hepatomegaly Ext: no edema, 2+ PT pulses bilaterally Skin: warm and dry Neuro:   no focal abnormalities noted  EKG:  NSR, nonspecific ST segment changes      ASSESSMENT AND PLAN:  Essential hypertension, benign  Continue Amlodipine Besylate Tablet, 10 MG, 1 tablet, Orally, Once a day Continue Benazepril HCl Tablet, 40 MG, 1 tablet, Orally, Once a day My recheck of BP was 158/72.  He has had some headaches with elevated readings.  Better when the readings come down. Continue regular exercise and attempt to weight loss. He should also minimize salt in the diet. If blood pressure remains elevated, could consider switching benazepril to a more potent medication like irbesartan 300 mg daily.   Check blood pressures at home.   HCTZ was stopped. Loses a lot of fluid with the Invokana.  He has maintained  weight loss as well. I encouraged him to continue his current lifestyle modifications.  2. Peripheral vascular disease  he reports that ultrasound shows some restenosis of his right SFA stent. no significant claudication. Preserved pulses.  F/u with u/s with Dr. Trula Slade.  He had an SFA velocity of 280 cm/sec.  Prior pseudoaneurysm with the last procedure. 3. Shortness of breath  Diagnostic Imaging:EC Echocardiogram (Ordered for 02/23/2012)  EC Echocardiogram showed LVH but normal LV function.  Resolved.   4. Chest pain:  Resolved.  Negative  cardiolite in 11/14.   Signed, Mina Marble, MD, [facility] 01/30/2014 9:16 AM

## 2014-01-30 NOTE — Patient Instructions (Signed)
Your physician recommends that you continue on your current medications as directed. Please refer to the Current Medication list given to you today.  Your physician wants you to follow-up in: 1 year with Dr. Varanasi. You will receive a reminder letter in the mail two months in advance. If you don't receive a letter, please call our office to schedule the follow-up appointment.  

## 2014-03-02 DIAGNOSIS — G4730 Sleep apnea, unspecified: Secondary | ICD-10-CM

## 2014-03-02 HISTORY — DX: Sleep apnea, unspecified: G47.30

## 2014-04-05 DIAGNOSIS — M79675 Pain in left toe(s): Secondary | ICD-10-CM | POA: Diagnosis not present

## 2014-04-05 DIAGNOSIS — M79674 Pain in right toe(s): Secondary | ICD-10-CM | POA: Diagnosis not present

## 2014-04-05 DIAGNOSIS — B351 Tinea unguium: Secondary | ICD-10-CM | POA: Diagnosis not present

## 2014-04-30 ENCOUNTER — Ambulatory Visit: Payer: Self-pay | Admitting: Surgery

## 2014-04-30 ENCOUNTER — Other Ambulatory Visit (HOSPITAL_COMMUNITY): Payer: Self-pay

## 2014-04-30 ENCOUNTER — Encounter (HOSPITAL_COMMUNITY): Payer: Self-pay

## 2014-06-13 ENCOUNTER — Telehealth: Payer: Self-pay | Admitting: Surgery

## 2014-06-13 ENCOUNTER — Other Ambulatory Visit: Payer: Self-pay | Admitting: *Deleted

## 2014-06-13 DIAGNOSIS — Z48812 Encounter for surgical aftercare following surgery on the circulatory system: Secondary | ICD-10-CM

## 2014-06-13 DIAGNOSIS — I70219 Atherosclerosis of native arteries of extremities with intermittent claudication, unspecified extremity: Secondary | ICD-10-CM

## 2014-06-13 NOTE — Telephone Encounter (Signed)
-----   Message from Mena Goes, RN sent at 06/13/2014  4:17 PM EDT ----- Regarding: Schedule  Patient would like to have bilateral LE duplex with the ABIs, he is having increased claudication symptoms in his LLE like what he had prior to RLE stenting.  He sees [facility] on 07-02-14.  Right now his vascular labs are on 06-18-14 but we can move that around since he is not seeing [facility] on same date.  Thanks, order is already in for bilateral

## 2014-06-18 ENCOUNTER — Encounter (HOSPITAL_COMMUNITY): Payer: Self-pay

## 2014-06-18 ENCOUNTER — Other Ambulatory Visit (HOSPITAL_COMMUNITY): Payer: Self-pay

## 2014-06-18 ENCOUNTER — Ambulatory Visit (HOSPITAL_COMMUNITY)
Admission: RE | Admit: 2014-06-18 | Discharge: 2014-06-18 | Disposition: A | Discharge status: Home / Self Care | Payer: Medicare Other | Source: Ambulatory Visit | Attending: Surgery | Admitting: Surgery

## 2014-06-18 ENCOUNTER — Ambulatory Visit (INDEPENDENT_AMBULATORY_CARE_PROVIDER_SITE_OTHER)
Admission: RE | Admit: 2014-06-18 | Discharge: 2014-06-18 | Disposition: A | Discharge status: Home / Self Care | Payer: Medicare Other | Source: Ambulatory Visit | Attending: Surgery | Admitting: Surgery

## 2014-06-18 DIAGNOSIS — I70219 Atherosclerosis of native arteries of extremities with intermittent claudication, unspecified extremity: Secondary | ICD-10-CM | POA: Diagnosis not present

## 2014-06-18 DIAGNOSIS — Z48812 Encounter for surgical aftercare following surgery on the circulatory system: Secondary | ICD-10-CM | POA: Diagnosis not present

## 2014-06-18 DIAGNOSIS — I739 Peripheral vascular disease, unspecified: Secondary | ICD-10-CM

## 2014-06-19 ENCOUNTER — Encounter: Payer: Self-pay | Admitting: Surgery

## 2014-06-20 ENCOUNTER — Encounter: Payer: Self-pay | Admitting: Surgery

## 2014-06-20 ENCOUNTER — Ambulatory Visit (INDEPENDENT_AMBULATORY_CARE_PROVIDER_SITE_OTHER): Payer: Medicare Other | Admitting: Surgery

## 2014-06-20 VITALS — BP 155/90 | HR 90 | Temp 98.1°F | Resp 16 | Ht 71.75 in | Wt 220.0 lb

## 2014-06-20 DIAGNOSIS — I70219 Atherosclerosis of native arteries of extremities with intermittent claudication, unspecified extremity: Secondary | ICD-10-CM | POA: Diagnosis not present

## 2014-06-20 NOTE — Progress Notes (Signed)
Patient name: Michael [name] MRN: [id number] DOB: [birth year] Sex: male     Chief Complaint  Patient presents with  . PVD    6 month follow up;  Pt c/o increase in left calf pain   He had vascular lab on 06-18-14    HISTORY OF PRESENT ILLNESS: The patient is back today for followup. He Is a 64 year old male patient who was initially seen by Dr. Kellie Simmering for right leg claudication. I subsequently placed a right superficial femoral artery stent in September 2012. The patient is done very well since that time.  I have been following velocity elevations of 280 within the stent.  He remains asymptomatic.  He does complain of knee pain bilaterally which is an orthopedic issue.  His diabetes has been under good control.  He remains very active.  Past Medical History  Diagnosis Date  . Diabetes mellitus   . Hypertension   . Hyperlipidemia   . Anxiety   . Allergy     Past Surgical History  Procedure Laterality Date  . Rotator cuff repair      1990  . Knee arthroscopy    . Angioplasty / stenting femoral  11/05/10    Left SFA stent    History   Social History  . Marital Status: Married    Spouse Name: N/A  . Number of Children: N/A  . Years of Education: N/A   Occupational History  . Not on file.   Social History Main Topics  . Smoking status: Never Smoker   . Smokeless tobacco: Former Systems developer    Quit date: 10/26/1981  . Alcohol Use: 0.0 oz/week     Comment: occasional drink  . Drug Use: No  . Sexual Activity: Not on file   Other Topics Concern  . Not on file   Social History Narrative    Family History  Problem Relation Age of Onset  . Heart disease Father   . Deep vein thrombosis Father   . Hyperlipidemia Father   . Hypertension Father   . Heart attack Father   . Peripheral vascular disease Father   . Hyperlipidemia Mother   . Hypertension Mother   . Cancer Sister     Allergies as of 06/20/2014 - Review Complete 06/20/2014  Allergen Reaction Noted    . Actos [pioglitazone]  01/13/2013  . Byetta 10 mcg pen [exenatide]  01/13/2013  . Demerol  10/27/2010  . Glimepiride  01/13/2013  . Nabumetone  01/13/2013  . Oxycodone  01/13/2013  . Penicillins  10/27/2010    Current Outpatient Prescriptions on File Prior to Visit  Medication Sig Dispense Refill  . AMBULATORY NON FORMULARY MEDICATION Medication Name: EUCLID STUDY DRUG-Plavix vs Brilinta    . amLODipine (NORVASC) 10 MG tablet Take 10 mg by mouth daily.      . benazepril (LOTENSIN) 40 MG tablet Take 40 mg by mouth daily.      . Canagliflozin (INVOKANA) 300 MG TABS Take by mouth.    . clopidogrel (PLAVIX) 75 MG tablet Take 75 mg by mouth daily with breakfast.    . Cyanocobalamin (VITAMIN B 12 PO) Take by mouth.    . insulin aspart (NOVOLOG) 100 UNIT/ML injection Inject 12 Units into the skin 3 (three) times daily before meals.     . insulin glargine (LANTUS) 100 UNIT/ML injection Inject 20 Units into the skin at bedtime.     . metFORMIN (GLUMETZA) 1000 MG (MOD) 24 hr tablet Take 1,000 mg by mouth  2 (two) times daily with a meal.     . rosuvastatin (CRESTOR) 10 MG tablet Take 10 mg by mouth daily.      . VOLTAREN 1 % GEL Apply 1 application topically daily.     No current facility-administered medications on file prior to visit.     REVIEW OF SYSTEMS: Cardiovascular: No chest pain, chest pressure, palpitations, orthopnea, or dyspnea on exertion. No claudication or rest pain,  No history of DVT or phlebitis. Pulmonary: No productive cough, asthma or wheezing. Neurologic: No weakness, paresthesias, aphasia, or amaurosis. No dizziness. Hematologic: No bleeding problems or clotting disorders. Musculoskeletal: Bilateral knee pain, left greater than right Gastrointestinal: No blood in stool or hematemesis Genitourinary: No dysuria or hematuria. Psychiatric:: No history of major depression. Integumentary: No rashes or ulcers. Constitutional: No fever or chills.  PHYSICAL  EXAMINATION:   Vital signs are  Filed Vitals:   06/20/14 0938  BP: 155/90  Pulse: 90  Temp: 98.1 F (36.7 C)  TempSrc: Oral  Resp: 16  Height: 5' 11.75" (1.822 m)  Weight: 220 lb (99.791 kg)  SpO2: 99%   Body mass index is 30.06 kg/(m^2). General: The patient appears their stated age. HEENT:  No gross abnormalities Pulmonary:  Non labored breathing Musculoskeletal: There are no major deformities. Neurologic: No focal weakness or paresthesias are detected, Skin: There are no ulcer or rashes noted. Psychiatric: The patient has normal affect. Cardiovascular: There is a regular rate and rhythm without significant murmur appreciated. No carotid bruits.  Palpable pedal pulses.  Diagnostic Studies I have reviewed his ultrasound which shows an ABI of 1.07 on the right and 1.20 on the left.  All with triphasic waveforms.  Peak velocity within his stent is 229  Assessment: Status post right superficial femoral artery stent for claudication Plan:  the patient continues to do very well.  The velocity profile within his stent has decreased.  He remains asymptomatic.  He will follow up in one year with a repeat ABI and duplex  V. Leia Alf, M.D. [facility] Office: [phone] Pager:  [phone]

## 2014-06-21 ENCOUNTER — Ambulatory Visit: Payer: Medicare Other

## 2014-06-21 ENCOUNTER — Encounter: Payer: Self-pay | Admitting: *Deleted

## 2014-06-21 VITALS — BP 136/87 | HR 98

## 2014-06-21 DIAGNOSIS — Z006 Encounter for examination for normal comparison and control in clinical research program: Secondary | ICD-10-CM

## 2014-06-21 NOTE — Progress Notes (Unsigned)
Pt seen for M36V12 [place] Study Visit. No complaints or hospitalizations/surgeries since last visit. Previous study drug returned and new study drug dispensed.

## 2014-06-22 ENCOUNTER — Other Ambulatory Visit (HOSPITAL_COMMUNITY): Payer: Self-pay | Admitting: Unknown Physician Specialty

## 2014-06-22 ENCOUNTER — Other Ambulatory Visit (HOSPITAL_COMMUNITY): Payer: Self-pay | Admitting: Cardiovascular Disease

## 2014-06-22 DIAGNOSIS — Z006 Encounter for examination for normal comparison and control in clinical research program: Secondary | ICD-10-CM

## 2014-06-26 NOTE — Addendum Note (Signed)
Addended by: Mena Goes on: 06/26/2014 03:08 PM   Modules accepted: Orders

## 2014-07-02 ENCOUNTER — Encounter (HOSPITAL_COMMUNITY): Payer: Self-pay

## 2014-07-02 ENCOUNTER — Ambulatory Visit: Payer: Self-pay | Admitting: Surgery

## 2014-07-02 ENCOUNTER — Other Ambulatory Visit (HOSPITAL_COMMUNITY): Payer: Self-pay

## 2014-07-06 DIAGNOSIS — M79672 Pain in left foot: Secondary | ICD-10-CM | POA: Diagnosis not present

## 2014-07-06 DIAGNOSIS — L409 Psoriasis, unspecified: Secondary | ICD-10-CM | POA: Diagnosis not present

## 2014-07-06 DIAGNOSIS — M7752 Other enthesopathy of left foot: Secondary | ICD-10-CM | POA: Diagnosis not present

## 2014-07-10 ENCOUNTER — Other Ambulatory Visit: Payer: Self-pay | Admitting: *Deleted

## 2014-07-10 ENCOUNTER — Ambulatory Visit (HOSPITAL_COMMUNITY)
Admission: RE | Admit: 2014-07-10 | Discharge: 2014-07-10 | Disposition: A | Discharge status: Home / Self Care | Payer: Medicare Other | Source: Ambulatory Visit | Attending: Cardiovascular Disease | Admitting: Cardiovascular Disease

## 2014-07-10 VITALS — BP 171/95 | HR 85

## 2014-07-10 DIAGNOSIS — Z006 Encounter for examination for normal comparison and control in clinical research program: Secondary | ICD-10-CM

## 2014-07-10 DIAGNOSIS — I739 Peripheral vascular disease, unspecified: Secondary | ICD-10-CM | POA: Insufficient documentation

## 2014-07-10 MED ORDER — CLOPIDOGREL BISULFATE 75 MG PO TABS: 75.0000 mg | ORAL_TABLET | Freq: Every day | ORAL | Status: DC

## 2014-07-10 NOTE — Progress Notes (Unsigned)
Pt seen in [facility] for End of Study treatment visit. ABI's completed per research protocol. Study medication completed and returned. Pt to resume open label Plavix 75 mg daily. Pt will be seen for Study Closure Visit in 30 days.

## 2014-07-10 NOTE — Progress Notes (Signed)
  VASCULAR LAB PRELIMINARY  ARTERIAL  ABI completed:    RIGHT    LEFT    PRESSURE WAVEFORM  PRESSURE WAVEFORM  BRACHIAL 160 triphasic BRACHIAL 162 triphasic         AT 103 triphasic AT 184 triphasic  PT 149 triphasic PT 184 triphasic`         GREAT TOE  NA GREAT TOE  NA    RIGHT LEFT  ABI 0.92 1.14     August Albino, RVT 07/10/2014, 11:22 AM

## 2014-07-25 DIAGNOSIS — I10 Essential (primary) hypertension: Secondary | ICD-10-CM | POA: Diagnosis not present

## 2014-07-25 DIAGNOSIS — H00019 Hordeolum externum unspecified eye, unspecified eyelid: Secondary | ICD-10-CM | POA: Diagnosis not present

## 2014-07-25 DIAGNOSIS — Z1389 Encounter for screening for other disorder: Secondary | ICD-10-CM | POA: Diagnosis not present

## 2014-08-13 DIAGNOSIS — J0110 Acute frontal sinusitis, unspecified: Secondary | ICD-10-CM | POA: Diagnosis not present

## 2014-08-13 DIAGNOSIS — I10 Essential (primary) hypertension: Secondary | ICD-10-CM | POA: Diagnosis not present

## 2014-08-13 DIAGNOSIS — G479 Sleep disorder, unspecified: Secondary | ICD-10-CM | POA: Diagnosis not present

## 2014-08-14 ENCOUNTER — Encounter: Payer: Self-pay | Admitting: *Deleted

## 2014-08-14 DIAGNOSIS — Z006 Encounter for examination for normal comparison and control in clinical research program: Secondary | ICD-10-CM

## 2014-08-14 NOTE — Progress Notes (Signed)
End of Study visit completed per protocol. No new hospitalizations or events. No medication changes. Pt doing well.

## 2014-09-06 DIAGNOSIS — E1165 Type 2 diabetes mellitus with hyperglycemia: Secondary | ICD-10-CM | POA: Diagnosis not present

## 2014-09-06 DIAGNOSIS — E230 Hypopituitarism: Secondary | ICD-10-CM | POA: Diagnosis not present

## 2014-09-06 DIAGNOSIS — Z23 Encounter for immunization: Secondary | ICD-10-CM | POA: Diagnosis not present

## 2014-09-06 DIAGNOSIS — Z794 Long term (current) use of insulin: Secondary | ICD-10-CM | POA: Diagnosis not present

## 2014-09-06 DIAGNOSIS — E1151 Type 2 diabetes mellitus with diabetic peripheral angiopathy without gangrene: Secondary | ICD-10-CM | POA: Diagnosis not present

## 2014-09-07 ENCOUNTER — Other Ambulatory Visit: Payer: Self-pay | Admitting: Podiatry

## 2014-10-24 ENCOUNTER — Encounter: Payer: Self-pay | Admitting: Interventional Cardiology

## 2014-11-01 DIAGNOSIS — R350 Frequency of micturition: Secondary | ICD-10-CM | POA: Diagnosis not present

## 2014-11-01 DIAGNOSIS — G479 Sleep disorder, unspecified: Secondary | ICD-10-CM | POA: Diagnosis not present

## 2014-11-22 DIAGNOSIS — Z23 Encounter for immunization: Secondary | ICD-10-CM | POA: Diagnosis not present

## 2014-11-26 DIAGNOSIS — M5136 Other intervertebral disc degeneration, lumbar region: Secondary | ICD-10-CM | POA: Diagnosis not present

## 2014-11-26 DIAGNOSIS — M545 Low back pain: Secondary | ICD-10-CM | POA: Diagnosis not present

## 2014-11-26 DIAGNOSIS — M9903 Segmental and somatic dysfunction of lumbar region: Secondary | ICD-10-CM | POA: Diagnosis not present

## 2014-11-26 DIAGNOSIS — M62830 Muscle spasm of back: Secondary | ICD-10-CM | POA: Diagnosis not present

## 2014-11-27 DIAGNOSIS — M5136 Other intervertebral disc degeneration, lumbar region: Secondary | ICD-10-CM | POA: Diagnosis not present

## 2014-11-27 DIAGNOSIS — M62830 Muscle spasm of back: Secondary | ICD-10-CM | POA: Diagnosis not present

## 2014-11-27 DIAGNOSIS — M545 Low back pain: Secondary | ICD-10-CM | POA: Diagnosis not present

## 2014-11-27 DIAGNOSIS — M9903 Segmental and somatic dysfunction of lumbar region: Secondary | ICD-10-CM | POA: Diagnosis not present

## 2014-11-28 DIAGNOSIS — M9903 Segmental and somatic dysfunction of lumbar region: Secondary | ICD-10-CM | POA: Diagnosis not present

## 2014-11-28 DIAGNOSIS — M62830 Muscle spasm of back: Secondary | ICD-10-CM | POA: Diagnosis not present

## 2014-11-28 DIAGNOSIS — M5136 Other intervertebral disc degeneration, lumbar region: Secondary | ICD-10-CM | POA: Diagnosis not present

## 2014-11-28 DIAGNOSIS — M545 Low back pain: Secondary | ICD-10-CM | POA: Diagnosis not present

## 2014-12-04 ENCOUNTER — Other Ambulatory Visit: Payer: Self-pay | Admitting: Podiatry

## 2014-12-04 DIAGNOSIS — M545 Low back pain: Secondary | ICD-10-CM | POA: Diagnosis not present

## 2014-12-04 DIAGNOSIS — M9903 Segmental and somatic dysfunction of lumbar region: Secondary | ICD-10-CM | POA: Diagnosis not present

## 2014-12-04 DIAGNOSIS — M5136 Other intervertebral disc degeneration, lumbar region: Secondary | ICD-10-CM | POA: Diagnosis not present

## 2014-12-04 DIAGNOSIS — M62830 Muscle spasm of back: Secondary | ICD-10-CM | POA: Diagnosis not present

## 2014-12-04 NOTE — Telephone Encounter (Signed)
Dr. Prudence Davidson, this pt is not in the [facility] system yet, I can not refill.  Michael [name]

## 2014-12-05 DIAGNOSIS — M5136 Other intervertebral disc degeneration, lumbar region: Secondary | ICD-10-CM | POA: Diagnosis not present

## 2014-12-05 DIAGNOSIS — M545 Low back pain: Secondary | ICD-10-CM | POA: Diagnosis not present

## 2014-12-05 DIAGNOSIS — M9903 Segmental and somatic dysfunction of lumbar region: Secondary | ICD-10-CM | POA: Diagnosis not present

## 2014-12-05 DIAGNOSIS — M62830 Muscle spasm of back: Secondary | ICD-10-CM | POA: Diagnosis not present

## 2014-12-06 DIAGNOSIS — J069 Acute upper respiratory infection, unspecified: Secondary | ICD-10-CM | POA: Diagnosis not present

## 2014-12-06 DIAGNOSIS — S76319A Strain of muscle, fascia and tendon of the posterior muscle group at thigh level, unspecified thigh, initial encounter: Secondary | ICD-10-CM | POA: Diagnosis not present

## 2014-12-07 ENCOUNTER — Telehealth: Payer: Self-pay | Admitting: *Deleted

## 2014-12-07 DIAGNOSIS — M5136 Other intervertebral disc degeneration, lumbar region: Secondary | ICD-10-CM | POA: Diagnosis not present

## 2014-12-07 DIAGNOSIS — M62830 Muscle spasm of back: Secondary | ICD-10-CM | POA: Diagnosis not present

## 2014-12-07 DIAGNOSIS — M9903 Segmental and somatic dysfunction of lumbar region: Secondary | ICD-10-CM | POA: Diagnosis not present

## 2014-12-07 DIAGNOSIS — M545 Low back pain: Secondary | ICD-10-CM | POA: Diagnosis not present

## 2014-12-07 MED ORDER — MELOXICAM 15 MG PO TABS: 15.0000 mg | ORAL_TABLET | Freq: Every morning | ORAL | Status: DC

## 2014-12-07 NOTE — Telephone Encounter (Signed)
Received faxed request for Meloxicam.  Dr. Prudence Davidson ordered refill #90 +1 refill. Ordered.

## 2014-12-10 DIAGNOSIS — M545 Low back pain: Secondary | ICD-10-CM | POA: Diagnosis not present

## 2014-12-10 DIAGNOSIS — M62830 Muscle spasm of back: Secondary | ICD-10-CM | POA: Diagnosis not present

## 2014-12-10 DIAGNOSIS — M9903 Segmental and somatic dysfunction of lumbar region: Secondary | ICD-10-CM | POA: Diagnosis not present

## 2014-12-10 DIAGNOSIS — M5136 Other intervertebral disc degeneration, lumbar region: Secondary | ICD-10-CM | POA: Diagnosis not present

## 2014-12-11 ENCOUNTER — Other Ambulatory Visit: Payer: Self-pay | Admitting: Interventional Cardiology

## 2014-12-11 DIAGNOSIS — M5136 Other intervertebral disc degeneration, lumbar region: Secondary | ICD-10-CM | POA: Diagnosis not present

## 2014-12-11 DIAGNOSIS — M9903 Segmental and somatic dysfunction of lumbar region: Secondary | ICD-10-CM | POA: Diagnosis not present

## 2014-12-11 DIAGNOSIS — M62830 Muscle spasm of back: Secondary | ICD-10-CM | POA: Diagnosis not present

## 2014-12-11 DIAGNOSIS — M545 Low back pain: Secondary | ICD-10-CM | POA: Diagnosis not present

## 2014-12-11 MED ORDER — CLOPIDOGREL BISULFATE 75 MG PO TABS: 75.0000 mg | ORAL_TABLET | Freq: Every day | ORAL | Status: DC

## 2014-12-12 DIAGNOSIS — M5136 Other intervertebral disc degeneration, lumbar region: Secondary | ICD-10-CM | POA: Diagnosis not present

## 2014-12-12 DIAGNOSIS — M9903 Segmental and somatic dysfunction of lumbar region: Secondary | ICD-10-CM | POA: Diagnosis not present

## 2014-12-12 DIAGNOSIS — M62830 Muscle spasm of back: Secondary | ICD-10-CM | POA: Diagnosis not present

## 2014-12-12 DIAGNOSIS — M545 Low back pain: Secondary | ICD-10-CM | POA: Diagnosis not present

## 2014-12-17 DIAGNOSIS — M5136 Other intervertebral disc degeneration, lumbar region: Secondary | ICD-10-CM | POA: Diagnosis not present

## 2014-12-17 DIAGNOSIS — M62830 Muscle spasm of back: Secondary | ICD-10-CM | POA: Diagnosis not present

## 2014-12-17 DIAGNOSIS — M545 Low back pain: Secondary | ICD-10-CM | POA: Diagnosis not present

## 2014-12-17 DIAGNOSIS — M9903 Segmental and somatic dysfunction of lumbar region: Secondary | ICD-10-CM | POA: Diagnosis not present

## 2014-12-18 DIAGNOSIS — M9903 Segmental and somatic dysfunction of lumbar region: Secondary | ICD-10-CM | POA: Diagnosis not present

## 2014-12-18 DIAGNOSIS — M62830 Muscle spasm of back: Secondary | ICD-10-CM | POA: Diagnosis not present

## 2014-12-18 DIAGNOSIS — M5136 Other intervertebral disc degeneration, lumbar region: Secondary | ICD-10-CM | POA: Diagnosis not present

## 2014-12-18 DIAGNOSIS — M545 Low back pain: Secondary | ICD-10-CM | POA: Diagnosis not present

## 2014-12-25 DIAGNOSIS — M5136 Other intervertebral disc degeneration, lumbar region: Secondary | ICD-10-CM | POA: Diagnosis not present

## 2014-12-25 DIAGNOSIS — M4806 Spinal stenosis, lumbar region: Secondary | ICD-10-CM | POA: Diagnosis not present

## 2014-12-25 DIAGNOSIS — M545 Low back pain: Secondary | ICD-10-CM | POA: Diagnosis not present

## 2014-12-25 DIAGNOSIS — G5791 Unspecified mononeuropathy of right lower limb: Secondary | ICD-10-CM | POA: Diagnosis not present

## 2014-12-26 DIAGNOSIS — M4806 Spinal stenosis, lumbar region: Secondary | ICD-10-CM | POA: Diagnosis not present

## 2014-12-26 DIAGNOSIS — G5791 Unspecified mononeuropathy of right lower limb: Secondary | ICD-10-CM | POA: Diagnosis not present

## 2014-12-26 DIAGNOSIS — M629 Disorder of muscle, unspecified: Secondary | ICD-10-CM | POA: Diagnosis not present

## 2014-12-26 DIAGNOSIS — M5136 Other intervertebral disc degeneration, lumbar region: Secondary | ICD-10-CM | POA: Diagnosis not present

## 2014-12-26 DIAGNOSIS — M545 Low back pain: Secondary | ICD-10-CM | POA: Diagnosis not present

## 2014-12-27 DIAGNOSIS — M545 Low back pain: Secondary | ICD-10-CM | POA: Diagnosis not present

## 2014-12-27 DIAGNOSIS — G5791 Unspecified mononeuropathy of right lower limb: Secondary | ICD-10-CM | POA: Diagnosis not present

## 2014-12-27 DIAGNOSIS — M4806 Spinal stenosis, lumbar region: Secondary | ICD-10-CM | POA: Diagnosis not present

## 2014-12-27 DIAGNOSIS — M629 Disorder of muscle, unspecified: Secondary | ICD-10-CM | POA: Diagnosis not present

## 2014-12-27 DIAGNOSIS — M5136 Other intervertebral disc degeneration, lumbar region: Secondary | ICD-10-CM | POA: Diagnosis not present

## 2014-12-28 DIAGNOSIS — M62830 Muscle spasm of back: Secondary | ICD-10-CM | POA: Diagnosis not present

## 2014-12-28 DIAGNOSIS — M9903 Segmental and somatic dysfunction of lumbar region: Secondary | ICD-10-CM | POA: Diagnosis not present

## 2014-12-28 DIAGNOSIS — M5136 Other intervertebral disc degeneration, lumbar region: Secondary | ICD-10-CM | POA: Diagnosis not present

## 2014-12-28 DIAGNOSIS — M545 Low back pain: Secondary | ICD-10-CM | POA: Diagnosis not present

## 2015-01-01 DIAGNOSIS — M5136 Other intervertebral disc degeneration, lumbar region: Secondary | ICD-10-CM | POA: Diagnosis not present

## 2015-01-01 DIAGNOSIS — M545 Low back pain: Secondary | ICD-10-CM | POA: Diagnosis not present

## 2015-01-01 DIAGNOSIS — M62830 Muscle spasm of back: Secondary | ICD-10-CM | POA: Diagnosis not present

## 2015-01-01 DIAGNOSIS — M4806 Spinal stenosis, lumbar region: Secondary | ICD-10-CM | POA: Diagnosis not present

## 2015-01-01 DIAGNOSIS — M9903 Segmental and somatic dysfunction of lumbar region: Secondary | ICD-10-CM | POA: Diagnosis not present

## 2015-01-01 DIAGNOSIS — G5791 Unspecified mononeuropathy of right lower limb: Secondary | ICD-10-CM | POA: Diagnosis not present

## 2015-01-01 DIAGNOSIS — M629 Disorder of muscle, unspecified: Secondary | ICD-10-CM | POA: Diagnosis not present

## 2015-01-02 ENCOUNTER — Other Ambulatory Visit: Payer: Self-pay | Admitting: Interventional Cardiology

## 2015-01-02 DIAGNOSIS — M5136 Other intervertebral disc degeneration, lumbar region: Secondary | ICD-10-CM | POA: Diagnosis not present

## 2015-01-02 DIAGNOSIS — M62830 Muscle spasm of back: Secondary | ICD-10-CM | POA: Diagnosis not present

## 2015-01-02 DIAGNOSIS — M545 Low back pain: Secondary | ICD-10-CM | POA: Diagnosis not present

## 2015-01-02 DIAGNOSIS — G5791 Unspecified mononeuropathy of right lower limb: Secondary | ICD-10-CM | POA: Diagnosis not present

## 2015-01-02 DIAGNOSIS — M629 Disorder of muscle, unspecified: Secondary | ICD-10-CM | POA: Diagnosis not present

## 2015-01-02 DIAGNOSIS — M9903 Segmental and somatic dysfunction of lumbar region: Secondary | ICD-10-CM | POA: Diagnosis not present

## 2015-01-02 DIAGNOSIS — M4806 Spinal stenosis, lumbar region: Secondary | ICD-10-CM | POA: Diagnosis not present

## 2015-01-03 DIAGNOSIS — E119 Type 2 diabetes mellitus without complications: Secondary | ICD-10-CM | POA: Diagnosis not present

## 2015-01-03 DIAGNOSIS — H40013 Open angle with borderline findings, low risk, bilateral: Secondary | ICD-10-CM | POA: Diagnosis not present

## 2015-01-03 DIAGNOSIS — Z961 Presence of intraocular lens: Secondary | ICD-10-CM | POA: Diagnosis not present

## 2015-01-04 DIAGNOSIS — M5136 Other intervertebral disc degeneration, lumbar region: Secondary | ICD-10-CM | POA: Diagnosis not present

## 2015-01-04 DIAGNOSIS — M62830 Muscle spasm of back: Secondary | ICD-10-CM | POA: Diagnosis not present

## 2015-01-04 DIAGNOSIS — M9903 Segmental and somatic dysfunction of lumbar region: Secondary | ICD-10-CM | POA: Diagnosis not present

## 2015-01-04 DIAGNOSIS — M545 Low back pain: Secondary | ICD-10-CM | POA: Diagnosis not present

## 2015-01-07 DIAGNOSIS — M62830 Muscle spasm of back: Secondary | ICD-10-CM | POA: Diagnosis not present

## 2015-01-07 DIAGNOSIS — G5791 Unspecified mononeuropathy of right lower limb: Secondary | ICD-10-CM | POA: Diagnosis not present

## 2015-01-07 DIAGNOSIS — M5136 Other intervertebral disc degeneration, lumbar region: Secondary | ICD-10-CM | POA: Diagnosis not present

## 2015-01-07 DIAGNOSIS — M545 Low back pain: Secondary | ICD-10-CM | POA: Diagnosis not present

## 2015-01-07 DIAGNOSIS — M629 Disorder of muscle, unspecified: Secondary | ICD-10-CM | POA: Diagnosis not present

## 2015-01-07 DIAGNOSIS — M4806 Spinal stenosis, lumbar region: Secondary | ICD-10-CM | POA: Diagnosis not present

## 2015-01-07 DIAGNOSIS — M9903 Segmental and somatic dysfunction of lumbar region: Secondary | ICD-10-CM | POA: Diagnosis not present

## 2015-01-08 DIAGNOSIS — E230 Hypopituitarism: Secondary | ICD-10-CM | POA: Diagnosis not present

## 2015-01-08 DIAGNOSIS — Z794 Long term (current) use of insulin: Secondary | ICD-10-CM | POA: Diagnosis not present

## 2015-01-08 DIAGNOSIS — E1151 Type 2 diabetes mellitus with diabetic peripheral angiopathy without gangrene: Secondary | ICD-10-CM | POA: Diagnosis not present

## 2015-01-09 DIAGNOSIS — M9903 Segmental and somatic dysfunction of lumbar region: Secondary | ICD-10-CM | POA: Diagnosis not present

## 2015-01-09 DIAGNOSIS — M5136 Other intervertebral disc degeneration, lumbar region: Secondary | ICD-10-CM | POA: Diagnosis not present

## 2015-01-09 DIAGNOSIS — M545 Low back pain: Secondary | ICD-10-CM | POA: Diagnosis not present

## 2015-01-09 DIAGNOSIS — M62830 Muscle spasm of back: Secondary | ICD-10-CM | POA: Diagnosis not present

## 2015-01-10 DIAGNOSIS — H40013 Open angle with borderline findings, low risk, bilateral: Secondary | ICD-10-CM | POA: Diagnosis not present

## 2015-01-10 DIAGNOSIS — Z961 Presence of intraocular lens: Secondary | ICD-10-CM | POA: Diagnosis not present

## 2015-01-11 DIAGNOSIS — M9903 Segmental and somatic dysfunction of lumbar region: Secondary | ICD-10-CM | POA: Diagnosis not present

## 2015-01-11 DIAGNOSIS — M545 Low back pain: Secondary | ICD-10-CM | POA: Diagnosis not present

## 2015-01-11 DIAGNOSIS — M62830 Muscle spasm of back: Secondary | ICD-10-CM | POA: Diagnosis not present

## 2015-01-11 DIAGNOSIS — M5136 Other intervertebral disc degeneration, lumbar region: Secondary | ICD-10-CM | POA: Diagnosis not present

## 2015-01-14 DIAGNOSIS — M4806 Spinal stenosis, lumbar region: Secondary | ICD-10-CM | POA: Diagnosis not present

## 2015-01-14 DIAGNOSIS — M629 Disorder of muscle, unspecified: Secondary | ICD-10-CM | POA: Diagnosis not present

## 2015-01-14 DIAGNOSIS — M62830 Muscle spasm of back: Secondary | ICD-10-CM | POA: Diagnosis not present

## 2015-01-14 DIAGNOSIS — M545 Low back pain: Secondary | ICD-10-CM | POA: Diagnosis not present

## 2015-01-14 DIAGNOSIS — M5136 Other intervertebral disc degeneration, lumbar region: Secondary | ICD-10-CM | POA: Diagnosis not present

## 2015-01-14 DIAGNOSIS — G5791 Unspecified mononeuropathy of right lower limb: Secondary | ICD-10-CM | POA: Diagnosis not present

## 2015-01-14 DIAGNOSIS — M9903 Segmental and somatic dysfunction of lumbar region: Secondary | ICD-10-CM | POA: Diagnosis not present

## 2015-01-17 DIAGNOSIS — M4806 Spinal stenosis, lumbar region: Secondary | ICD-10-CM | POA: Diagnosis not present

## 2015-01-17 DIAGNOSIS — G5791 Unspecified mononeuropathy of right lower limb: Secondary | ICD-10-CM | POA: Diagnosis not present

## 2015-01-17 DIAGNOSIS — M545 Low back pain: Secondary | ICD-10-CM | POA: Diagnosis not present

## 2015-01-17 DIAGNOSIS — M629 Disorder of muscle, unspecified: Secondary | ICD-10-CM | POA: Diagnosis not present

## 2015-01-17 DIAGNOSIS — M5136 Other intervertebral disc degeneration, lumbar region: Secondary | ICD-10-CM | POA: Diagnosis not present

## 2015-01-18 DIAGNOSIS — M9903 Segmental and somatic dysfunction of lumbar region: Secondary | ICD-10-CM | POA: Diagnosis not present

## 2015-01-18 DIAGNOSIS — M545 Low back pain: Secondary | ICD-10-CM | POA: Diagnosis not present

## 2015-01-18 DIAGNOSIS — M5136 Other intervertebral disc degeneration, lumbar region: Secondary | ICD-10-CM | POA: Diagnosis not present

## 2015-01-18 DIAGNOSIS — M62830 Muscle spasm of back: Secondary | ICD-10-CM | POA: Diagnosis not present

## 2015-01-21 DIAGNOSIS — G5791 Unspecified mononeuropathy of right lower limb: Secondary | ICD-10-CM | POA: Diagnosis not present

## 2015-01-21 DIAGNOSIS — M629 Disorder of muscle, unspecified: Secondary | ICD-10-CM | POA: Diagnosis not present

## 2015-01-21 DIAGNOSIS — M4806 Spinal stenosis, lumbar region: Secondary | ICD-10-CM | POA: Diagnosis not present

## 2015-01-21 DIAGNOSIS — M5136 Other intervertebral disc degeneration, lumbar region: Secondary | ICD-10-CM | POA: Diagnosis not present

## 2015-01-21 DIAGNOSIS — M545 Low back pain: Secondary | ICD-10-CM | POA: Diagnosis not present

## 2015-01-22 DIAGNOSIS — M9903 Segmental and somatic dysfunction of lumbar region: Secondary | ICD-10-CM | POA: Diagnosis not present

## 2015-01-22 DIAGNOSIS — M4806 Spinal stenosis, lumbar region: Secondary | ICD-10-CM | POA: Diagnosis not present

## 2015-01-22 DIAGNOSIS — M62830 Muscle spasm of back: Secondary | ICD-10-CM | POA: Diagnosis not present

## 2015-01-22 DIAGNOSIS — M629 Disorder of muscle, unspecified: Secondary | ICD-10-CM | POA: Diagnosis not present

## 2015-01-22 DIAGNOSIS — G5791 Unspecified mononeuropathy of right lower limb: Secondary | ICD-10-CM | POA: Diagnosis not present

## 2015-01-22 DIAGNOSIS — M5136 Other intervertebral disc degeneration, lumbar region: Secondary | ICD-10-CM | POA: Diagnosis not present

## 2015-01-22 DIAGNOSIS — M545 Low back pain: Secondary | ICD-10-CM | POA: Diagnosis not present

## 2015-01-23 DIAGNOSIS — M9903 Segmental and somatic dysfunction of lumbar region: Secondary | ICD-10-CM | POA: Diagnosis not present

## 2015-01-23 DIAGNOSIS — M5136 Other intervertebral disc degeneration, lumbar region: Secondary | ICD-10-CM | POA: Diagnosis not present

## 2015-01-23 DIAGNOSIS — M62830 Muscle spasm of back: Secondary | ICD-10-CM | POA: Diagnosis not present

## 2015-01-23 DIAGNOSIS — M545 Low back pain: Secondary | ICD-10-CM | POA: Diagnosis not present

## 2015-01-28 DIAGNOSIS — M545 Low back pain: Secondary | ICD-10-CM | POA: Diagnosis not present

## 2015-01-28 DIAGNOSIS — G5791 Unspecified mononeuropathy of right lower limb: Secondary | ICD-10-CM | POA: Diagnosis not present

## 2015-01-28 DIAGNOSIS — M5136 Other intervertebral disc degeneration, lumbar region: Secondary | ICD-10-CM | POA: Diagnosis not present

## 2015-01-28 DIAGNOSIS — M62830 Muscle spasm of back: Secondary | ICD-10-CM | POA: Diagnosis not present

## 2015-01-28 DIAGNOSIS — M9903 Segmental and somatic dysfunction of lumbar region: Secondary | ICD-10-CM | POA: Diagnosis not present

## 2015-01-28 DIAGNOSIS — M629 Disorder of muscle, unspecified: Secondary | ICD-10-CM | POA: Diagnosis not present

## 2015-01-28 DIAGNOSIS — M4806 Spinal stenosis, lumbar region: Secondary | ICD-10-CM | POA: Diagnosis not present

## 2015-01-29 ENCOUNTER — Ambulatory Visit (INDEPENDENT_AMBULATORY_CARE_PROVIDER_SITE_OTHER): Payer: Medicare Other | Admitting: Interventional Cardiology

## 2015-01-29 ENCOUNTER — Encounter: Payer: Self-pay | Admitting: Interventional Cardiology

## 2015-01-29 VITALS — BP 130/70 | HR 81 | Ht 71.75 in | Wt 230.0 lb

## 2015-01-29 DIAGNOSIS — I10 Essential (primary) hypertension: Secondary | ICD-10-CM | POA: Diagnosis not present

## 2015-01-29 DIAGNOSIS — E119 Type 2 diabetes mellitus without complications: Secondary | ICD-10-CM | POA: Insufficient documentation

## 2015-01-29 DIAGNOSIS — I70219 Atherosclerosis of native arteries of extremities with intermittent claudication, unspecified extremity: Secondary | ICD-10-CM | POA: Diagnosis not present

## 2015-01-29 DIAGNOSIS — E669 Obesity, unspecified: Secondary | ICD-10-CM

## 2015-01-29 DIAGNOSIS — M5136 Other intervertebral disc degeneration, lumbar region: Secondary | ICD-10-CM | POA: Diagnosis not present

## 2015-01-29 DIAGNOSIS — I739 Peripheral vascular disease, unspecified: Secondary | ICD-10-CM

## 2015-01-29 DIAGNOSIS — M545 Low back pain: Secondary | ICD-10-CM | POA: Diagnosis not present

## 2015-01-29 DIAGNOSIS — E1151 Type 2 diabetes mellitus with diabetic peripheral angiopathy without gangrene: Secondary | ICD-10-CM

## 2015-01-29 DIAGNOSIS — M6289 Other specified disorders of muscle: Secondary | ICD-10-CM | POA: Diagnosis not present

## 2015-01-29 DIAGNOSIS — M4806 Spinal stenosis, lumbar region: Secondary | ICD-10-CM | POA: Diagnosis not present

## 2015-01-29 NOTE — Patient Instructions (Signed)

## 2015-01-29 NOTE — Progress Notes (Signed)
Patient ID: Michael [name], male   DOB: 1950/10/17, 64 y.o.   MRN: [id number]     Cardiology Office Note   Date:  01/29/2015   ID:  Michael [name], DOB April 03, 1950, MRN [id number]  PCP:  Orpah Melter, MD    No chief complaint on file. PAD   Wt Readings from Last 3 Encounters:  01/29/15 230 lb (104.327 kg)  06/20/14 220 lb (99.791 kg)  01/30/14 218 lb 9.6 oz (99.156 kg)       History of Present Illness: [name] is a 64 y.o. male  Who has had PAD.  He had an SFA stent to the right leg in 2012.  His right leg claudication is improved after the stent.  He has been walking, without claudication.   He has gained some weight since his last visit.  His exercise has dropped off a little but he still uses a treadmill.  He is concerned about low T. He will discuss this with his PMD.  He has taken injectable testosterone.  Of note, earlier this year, his sister went to her local emergency room with severe hypertension. They treated this got her blood pressure down. She went home and died later that day.      Past Medical History  Diagnosis Date  . Diabetes mellitus   . Hypertension   . Hyperlipidemia   . Anxiety   . Allergy     Past Surgical History  Procedure Laterality Date  . Rotator cuff repair      1990  . Knee arthroscopy    . Angioplasty / stenting femoral  11/05/10    Left SFA stent     Current Outpatient Prescriptions  Medication Sig Dispense Refill  . amLODipine (NORVASC) 10 MG tablet Take 10 mg by mouth daily.      Marland Kitchen atorvastatin (LIPITOR) 40 MG tablet Take 40 mg by mouth at bedtime.  5  . benazepril (LOTENSIN) 40 MG tablet Take 40 mg by mouth daily.      . Canagliflozin (INVOKANA) 300 MG TABS Take by mouth.    . clonazePAM (KLONOPIN) 0.5 MG tablet TAKE 1/2-1 TABLET DAILY AS NEEDED FOR SLEEP  5  . clopidogrel (PLAVIX) 75 MG tablet Take 1 tablet (75 mg total) by mouth daily with breakfast. 30 tablet 3  . ibuprofen (ADVIL,MOTRIN) 800 MG tablet  Take 800 mg by mouth as needed for moderate pain.   0  . insulin glargine (LANTUS) 100 UNIT/ML injection Inject 20 Units into the skin at bedtime.     . meloxicam (MOBIC) 15 MG tablet Take 1 tablet (15 mg total) by mouth every morning. 90 tablet 1  . metFORMIN (GLUMETZA) 1000 MG (MOD) 24 hr tablet Take 1,000 mg by mouth 2 (two) times daily with a meal.     . NOVOLOG FLEXPEN 100 UNIT/ML FlexPen USE 11 UNITS IF BLOOD SUGAR 88 TO 126, THEN 1 UNIT FOR EVERY 40MG /DL ABOVE GOAL.  2  . rosuvastatin (CRESTOR) 10 MG tablet Take 10 mg by mouth daily.      . tamsulosin (FLOMAX) 0.4 MG CAPS capsule TAKE 1 CAPSULE BY MOUTH EVERY DAY AFTER DINNER  5  . VOLTAREN 1 % GEL Apply 1 application topically daily.     No current facility-administered medications for this visit.    Allergies:   Actos; Byetta 10 mcg pen; Demerol; Glimepiride; Nabumetone; Oxycodone; and Penicillins    Social History:  The patient  reports that he has never smoked. He quit  smokeless tobacco use about 33 years ago. He reports that he drinks alcohol. He reports that he does not use illicit drugs.   Family History:  The patient's family history includes Cancer in his sister; Deep vein thrombosis in his father; Heart attack in his father; Heart disease in his father; Hyperlipidemia in his father and mother; Hypertension in his father and mother; Peripheral vascular disease in his father.    ROS:  Please see the history of present illness.   Otherwise, review of systems are positive for weight gain.   All other systems are reviewed and negative.    PHYSICAL EXAM: VS:  BP 130/70 mmHg  Pulse 81  Ht 5' 11.75" (1.822 m)  Wt 230 lb (104.327 kg)  BMI 31.43 kg/m2  SpO2 96% , BMI Body mass index is 31.43 kg/(m^2). GEN: Well nourished, well developed, in no acute distress HEENT: normal Neck: no JVD, carotid bruits, or masses Cardiac: RRR; no murmurs, rubs, or gallops,no edema  Respiratory:  clear to auscultation bilaterally, normal work of  breathing GI: soft, nontender, nondistended, + BS MS: no deformity or atrophy Skin: warm and dry, no rash Neuro:  Strength and sensation are intact Psych: euthymic mood, full affect    Recent Labs: No results found for requested labs within last 365 days.   Lipid Panel No results found for: CHOL, TRIG, HDL, CHOLHDL, VLDL, LDLCALC, LDLDIRECT   Other studies Reviewed: Additional studies/ records that were reviewed today with results demonstrating: .   ASSESSMENT AND PLAN:  1. PAD: SFA stent followed by [facility].  Now with u/s once a year.  Mildly increased velocities in the stent.  2. Hypertension: COntrolled.  Normal Cardiolite in Jun 14, 2012. 3. DM: Managed with PMD.  A1C is high, around 9. 4. Obesity: We spoke about the importance of weight loss.  He is going to try to exercise more and eat better.  He is interested in diet pills.  I think conservative measures lke improved diet and more exercise would be safer for him.  Of note, his sister died unexpectedly earlier in June 15, 2014.  He has more incentive to make lifestyle modifications.   Current medicines are reviewed at length with the patient today.  The patient concerns regarding his medicines were addressed.  The following changes have been made:  No change  Labs/ tests ordered today include:  No orders of the defined types were placed in this encounter.    Recommend 150 minutes/week of aerobic exercise Low fat, low carb, high fiber diet recommended  Disposition:   FU in 1 year   Teresita Madura., MD  01/29/2015 2:34 PM    [facility] [address], [address], [place]  [address] Phone: [phone]; Fax: [phone]

## 2015-01-31 ENCOUNTER — Ambulatory Visit: Payer: Medicare Other | Admitting: Interventional Cardiology

## 2015-01-31 DIAGNOSIS — M62830 Muscle spasm of back: Secondary | ICD-10-CM | POA: Diagnosis not present

## 2015-01-31 DIAGNOSIS — M9903 Segmental and somatic dysfunction of lumbar region: Secondary | ICD-10-CM | POA: Diagnosis not present

## 2015-01-31 DIAGNOSIS — M545 Low back pain: Secondary | ICD-10-CM | POA: Diagnosis not present

## 2015-01-31 DIAGNOSIS — M5136 Other intervertebral disc degeneration, lumbar region: Secondary | ICD-10-CM | POA: Diagnosis not present

## 2015-02-04 DIAGNOSIS — M62830 Muscle spasm of back: Secondary | ICD-10-CM | POA: Diagnosis not present

## 2015-02-04 DIAGNOSIS — M9903 Segmental and somatic dysfunction of lumbar region: Secondary | ICD-10-CM | POA: Diagnosis not present

## 2015-02-04 DIAGNOSIS — M545 Low back pain: Secondary | ICD-10-CM | POA: Diagnosis not present

## 2015-02-04 DIAGNOSIS — M5136 Other intervertebral disc degeneration, lumbar region: Secondary | ICD-10-CM | POA: Diagnosis not present

## 2015-02-11 DIAGNOSIS — M9903 Segmental and somatic dysfunction of lumbar region: Secondary | ICD-10-CM | POA: Diagnosis not present

## 2015-02-11 DIAGNOSIS — M62830 Muscle spasm of back: Secondary | ICD-10-CM | POA: Diagnosis not present

## 2015-02-11 DIAGNOSIS — M5136 Other intervertebral disc degeneration, lumbar region: Secondary | ICD-10-CM | POA: Diagnosis not present

## 2015-02-11 DIAGNOSIS — M545 Low back pain: Secondary | ICD-10-CM | POA: Diagnosis not present

## 2015-02-13 ENCOUNTER — Other Ambulatory Visit: Payer: Self-pay | Admitting: Interventional Cardiology

## 2015-02-18 DIAGNOSIS — M545 Low back pain: Secondary | ICD-10-CM | POA: Diagnosis not present

## 2015-02-18 DIAGNOSIS — M9903 Segmental and somatic dysfunction of lumbar region: Secondary | ICD-10-CM | POA: Diagnosis not present

## 2015-02-18 DIAGNOSIS — M62830 Muscle spasm of back: Secondary | ICD-10-CM | POA: Diagnosis not present

## 2015-02-18 DIAGNOSIS — M5136 Other intervertebral disc degeneration, lumbar region: Secondary | ICD-10-CM | POA: Diagnosis not present

## 2015-02-20 DIAGNOSIS — N401 Enlarged prostate with lower urinary tract symptoms: Secondary | ICD-10-CM | POA: Diagnosis not present

## 2015-02-20 DIAGNOSIS — G479 Sleep disorder, unspecified: Secondary | ICD-10-CM | POA: Diagnosis not present

## 2015-02-20 DIAGNOSIS — I10 Essential (primary) hypertension: Secondary | ICD-10-CM | POA: Diagnosis not present

## 2015-02-27 DIAGNOSIS — M5136 Other intervertebral disc degeneration, lumbar region: Secondary | ICD-10-CM | POA: Diagnosis not present

## 2015-02-27 DIAGNOSIS — M62830 Muscle spasm of back: Secondary | ICD-10-CM | POA: Diagnosis not present

## 2015-02-27 DIAGNOSIS — M9903 Segmental and somatic dysfunction of lumbar region: Secondary | ICD-10-CM | POA: Diagnosis not present

## 2015-02-27 DIAGNOSIS — M545 Low back pain: Secondary | ICD-10-CM | POA: Diagnosis not present

## 2015-03-03 HISTORY — PX: ROTATOR CUFF REPAIR: SHX139

## 2015-03-04 DIAGNOSIS — M62830 Muscle spasm of back: Secondary | ICD-10-CM | POA: Diagnosis not present

## 2015-03-04 DIAGNOSIS — M5136 Other intervertebral disc degeneration, lumbar region: Secondary | ICD-10-CM | POA: Diagnosis not present

## 2015-03-04 DIAGNOSIS — M545 Low back pain: Secondary | ICD-10-CM | POA: Diagnosis not present

## 2015-03-04 DIAGNOSIS — M9903 Segmental and somatic dysfunction of lumbar region: Secondary | ICD-10-CM | POA: Diagnosis not present

## 2015-03-12 DIAGNOSIS — M545 Low back pain: Secondary | ICD-10-CM | POA: Diagnosis not present

## 2015-03-12 DIAGNOSIS — M9903 Segmental and somatic dysfunction of lumbar region: Secondary | ICD-10-CM | POA: Diagnosis not present

## 2015-03-12 DIAGNOSIS — M5136 Other intervertebral disc degeneration, lumbar region: Secondary | ICD-10-CM | POA: Diagnosis not present

## 2015-03-12 DIAGNOSIS — M62830 Muscle spasm of back: Secondary | ICD-10-CM | POA: Diagnosis not present

## 2015-03-21 ENCOUNTER — Other Ambulatory Visit: Payer: Self-pay

## 2015-03-21 MED ORDER — CLOPIDOGREL BISULFATE 75 MG PO TABS: 75.0000 mg | ORAL_TABLET | Freq: Every day | ORAL | Status: DC

## 2015-03-21 NOTE — Telephone Encounter (Signed)
Jettie Booze, MD at 01/29/2015 2:34 PM clopidogrel (PLAVIX) 75 MG tabletTake 1 tablet (75 mg total) by mouth daily with breakfast Current medicines are reviewed at length with the patient today. The patient concerns regarding his medicines were addressed.  The following changes have been made: No change

## 2015-03-26 DIAGNOSIS — M9903 Segmental and somatic dysfunction of lumbar region: Secondary | ICD-10-CM | POA: Diagnosis not present

## 2015-03-26 DIAGNOSIS — M62830 Muscle spasm of back: Secondary | ICD-10-CM | POA: Diagnosis not present

## 2015-03-26 DIAGNOSIS — M5136 Other intervertebral disc degeneration, lumbar region: Secondary | ICD-10-CM | POA: Diagnosis not present

## 2015-03-26 DIAGNOSIS — M545 Low back pain: Secondary | ICD-10-CM | POA: Diagnosis not present

## 2015-04-01 DIAGNOSIS — M545 Low back pain: Secondary | ICD-10-CM | POA: Diagnosis not present

## 2015-04-01 DIAGNOSIS — M5136 Other intervertebral disc degeneration, lumbar region: Secondary | ICD-10-CM | POA: Diagnosis not present

## 2015-04-01 DIAGNOSIS — M62830 Muscle spasm of back: Secondary | ICD-10-CM | POA: Diagnosis not present

## 2015-04-01 DIAGNOSIS — M9903 Segmental and somatic dysfunction of lumbar region: Secondary | ICD-10-CM | POA: Diagnosis not present

## 2015-04-04 DIAGNOSIS — M5136 Other intervertebral disc degeneration, lumbar region: Secondary | ICD-10-CM | POA: Diagnosis not present

## 2015-04-04 DIAGNOSIS — G5791 Unspecified mononeuropathy of right lower limb: Secondary | ICD-10-CM | POA: Diagnosis not present

## 2015-04-04 DIAGNOSIS — M545 Low back pain: Secondary | ICD-10-CM | POA: Diagnosis not present

## 2015-04-09 DIAGNOSIS — M6289 Other specified disorders of muscle: Secondary | ICD-10-CM | POA: Diagnosis not present

## 2015-04-09 DIAGNOSIS — M545 Low back pain: Secondary | ICD-10-CM | POA: Diagnosis not present

## 2015-04-09 DIAGNOSIS — M62830 Muscle spasm of back: Secondary | ICD-10-CM | POA: Diagnosis not present

## 2015-04-09 DIAGNOSIS — G5791 Unspecified mononeuropathy of right lower limb: Secondary | ICD-10-CM | POA: Diagnosis not present

## 2015-04-09 DIAGNOSIS — M9903 Segmental and somatic dysfunction of lumbar region: Secondary | ICD-10-CM | POA: Diagnosis not present

## 2015-04-09 DIAGNOSIS — M5136 Other intervertebral disc degeneration, lumbar region: Secondary | ICD-10-CM | POA: Diagnosis not present

## 2015-04-09 DIAGNOSIS — M4806 Spinal stenosis, lumbar region: Secondary | ICD-10-CM | POA: Diagnosis not present

## 2015-04-11 DIAGNOSIS — M545 Low back pain: Secondary | ICD-10-CM | POA: Diagnosis not present

## 2015-04-11 DIAGNOSIS — M5136 Other intervertebral disc degeneration, lumbar region: Secondary | ICD-10-CM | POA: Diagnosis not present

## 2015-04-11 DIAGNOSIS — G5791 Unspecified mononeuropathy of right lower limb: Secondary | ICD-10-CM | POA: Diagnosis not present

## 2015-04-11 DIAGNOSIS — M4806 Spinal stenosis, lumbar region: Secondary | ICD-10-CM | POA: Diagnosis not present

## 2015-04-11 DIAGNOSIS — M6289 Other specified disorders of muscle: Secondary | ICD-10-CM | POA: Diagnosis not present

## 2015-04-19 DIAGNOSIS — M4806 Spinal stenosis, lumbar region: Secondary | ICD-10-CM | POA: Diagnosis not present

## 2015-04-19 DIAGNOSIS — M545 Low back pain: Secondary | ICD-10-CM | POA: Diagnosis not present

## 2015-04-19 DIAGNOSIS — G5791 Unspecified mononeuropathy of right lower limb: Secondary | ICD-10-CM | POA: Diagnosis not present

## 2015-04-19 DIAGNOSIS — M6289 Other specified disorders of muscle: Secondary | ICD-10-CM | POA: Diagnosis not present

## 2015-04-19 DIAGNOSIS — M5136 Other intervertebral disc degeneration, lumbar region: Secondary | ICD-10-CM | POA: Diagnosis not present

## 2015-04-23 DIAGNOSIS — M545 Low back pain: Secondary | ICD-10-CM | POA: Diagnosis not present

## 2015-04-23 DIAGNOSIS — L0889 Other specified local infections of the skin and subcutaneous tissue: Secondary | ICD-10-CM | POA: Diagnosis not present

## 2015-04-23 DIAGNOSIS — M4806 Spinal stenosis, lumbar region: Secondary | ICD-10-CM | POA: Diagnosis not present

## 2015-04-23 DIAGNOSIS — M6289 Other specified disorders of muscle: Secondary | ICD-10-CM | POA: Diagnosis not present

## 2015-04-23 DIAGNOSIS — M5136 Other intervertebral disc degeneration, lumbar region: Secondary | ICD-10-CM | POA: Diagnosis not present

## 2015-04-23 DIAGNOSIS — G5791 Unspecified mononeuropathy of right lower limb: Secondary | ICD-10-CM | POA: Diagnosis not present

## 2015-04-25 DIAGNOSIS — M4806 Spinal stenosis, lumbar region: Secondary | ICD-10-CM | POA: Diagnosis not present

## 2015-04-25 DIAGNOSIS — M6289 Other specified disorders of muscle: Secondary | ICD-10-CM | POA: Diagnosis not present

## 2015-04-25 DIAGNOSIS — M545 Low back pain: Secondary | ICD-10-CM | POA: Diagnosis not present

## 2015-04-25 DIAGNOSIS — M5136 Other intervertebral disc degeneration, lumbar region: Secondary | ICD-10-CM | POA: Diagnosis not present

## 2015-04-25 DIAGNOSIS — G5791 Unspecified mononeuropathy of right lower limb: Secondary | ICD-10-CM | POA: Diagnosis not present

## 2015-05-02 DIAGNOSIS — G5791 Unspecified mononeuropathy of right lower limb: Secondary | ICD-10-CM | POA: Diagnosis not present

## 2015-05-02 DIAGNOSIS — M5136 Other intervertebral disc degeneration, lumbar region: Secondary | ICD-10-CM | POA: Diagnosis not present

## 2015-05-02 DIAGNOSIS — M25512 Pain in left shoulder: Secondary | ICD-10-CM | POA: Diagnosis not present

## 2015-05-02 DIAGNOSIS — S4992XA Unspecified injury of left shoulder and upper arm, initial encounter: Secondary | ICD-10-CM | POA: Diagnosis not present

## 2015-05-02 DIAGNOSIS — M4806 Spinal stenosis, lumbar region: Secondary | ICD-10-CM | POA: Diagnosis not present

## 2015-05-06 DIAGNOSIS — S4992XA Unspecified injury of left shoulder and upper arm, initial encounter: Secondary | ICD-10-CM | POA: Diagnosis not present

## 2015-05-06 DIAGNOSIS — M7552 Bursitis of left shoulder: Secondary | ICD-10-CM | POA: Diagnosis not present

## 2015-05-06 DIAGNOSIS — M7542 Impingement syndrome of left shoulder: Secondary | ICD-10-CM | POA: Diagnosis not present

## 2015-05-06 DIAGNOSIS — M25512 Pain in left shoulder: Secondary | ICD-10-CM | POA: Diagnosis not present

## 2015-05-07 DIAGNOSIS — M7542 Impingement syndrome of left shoulder: Secondary | ICD-10-CM | POA: Diagnosis not present

## 2015-05-07 DIAGNOSIS — S4992XA Unspecified injury of left shoulder and upper arm, initial encounter: Secondary | ICD-10-CM | POA: Diagnosis not present

## 2015-05-07 DIAGNOSIS — M25512 Pain in left shoulder: Secondary | ICD-10-CM | POA: Diagnosis not present

## 2015-05-07 DIAGNOSIS — M7552 Bursitis of left shoulder: Secondary | ICD-10-CM | POA: Diagnosis not present

## 2015-05-13 DIAGNOSIS — M7542 Impingement syndrome of left shoulder: Secondary | ICD-10-CM | POA: Diagnosis not present

## 2015-05-13 DIAGNOSIS — S4992XA Unspecified injury of left shoulder and upper arm, initial encounter: Secondary | ICD-10-CM | POA: Diagnosis not present

## 2015-05-13 DIAGNOSIS — E230 Hypopituitarism: Secondary | ICD-10-CM | POA: Diagnosis not present

## 2015-05-13 DIAGNOSIS — E1151 Type 2 diabetes mellitus with diabetic peripheral angiopathy without gangrene: Secondary | ICD-10-CM | POA: Diagnosis not present

## 2015-05-13 DIAGNOSIS — M7552 Bursitis of left shoulder: Secondary | ICD-10-CM | POA: Diagnosis not present

## 2015-05-13 DIAGNOSIS — M25512 Pain in left shoulder: Secondary | ICD-10-CM | POA: Diagnosis not present

## 2015-05-13 DIAGNOSIS — Z794 Long term (current) use of insulin: Secondary | ICD-10-CM | POA: Diagnosis not present

## 2015-05-14 DIAGNOSIS — M7552 Bursitis of left shoulder: Secondary | ICD-10-CM | POA: Diagnosis not present

## 2015-05-14 DIAGNOSIS — M25512 Pain in left shoulder: Secondary | ICD-10-CM | POA: Diagnosis not present

## 2015-05-14 DIAGNOSIS — M7542 Impingement syndrome of left shoulder: Secondary | ICD-10-CM | POA: Diagnosis not present

## 2015-05-14 DIAGNOSIS — S4992XA Unspecified injury of left shoulder and upper arm, initial encounter: Secondary | ICD-10-CM | POA: Diagnosis not present

## 2015-05-20 DIAGNOSIS — L97919 Non-pressure chronic ulcer of unspecified part of right lower leg with unspecified severity: Secondary | ICD-10-CM | POA: Diagnosis not present

## 2015-05-27 DIAGNOSIS — M25512 Pain in left shoulder: Secondary | ICD-10-CM | POA: Diagnosis not present

## 2015-05-27 DIAGNOSIS — M7542 Impingement syndrome of left shoulder: Secondary | ICD-10-CM | POA: Diagnosis not present

## 2015-05-27 DIAGNOSIS — S4992XA Unspecified injury of left shoulder and upper arm, initial encounter: Secondary | ICD-10-CM | POA: Diagnosis not present

## 2015-05-27 DIAGNOSIS — M7552 Bursitis of left shoulder: Secondary | ICD-10-CM | POA: Diagnosis not present

## 2015-05-30 DIAGNOSIS — S4992XA Unspecified injury of left shoulder and upper arm, initial encounter: Secondary | ICD-10-CM | POA: Diagnosis not present

## 2015-05-30 DIAGNOSIS — M25512 Pain in left shoulder: Secondary | ICD-10-CM | POA: Diagnosis not present

## 2015-05-30 DIAGNOSIS — M7552 Bursitis of left shoulder: Secondary | ICD-10-CM | POA: Diagnosis not present

## 2015-05-30 DIAGNOSIS — M7542 Impingement syndrome of left shoulder: Secondary | ICD-10-CM | POA: Diagnosis not present

## 2015-06-03 DIAGNOSIS — I872 Venous insufficiency (chronic) (peripheral): Secondary | ICD-10-CM | POA: Diagnosis not present

## 2015-06-03 DIAGNOSIS — L97911 Non-pressure chronic ulcer of unspecified part of right lower leg limited to breakdown of skin: Secondary | ICD-10-CM | POA: Diagnosis not present

## 2015-06-03 DIAGNOSIS — M545 Low back pain: Secondary | ICD-10-CM | POA: Diagnosis not present

## 2015-06-03 DIAGNOSIS — M7542 Impingement syndrome of left shoulder: Secondary | ICD-10-CM | POA: Diagnosis not present

## 2015-06-03 DIAGNOSIS — M25512 Pain in left shoulder: Secondary | ICD-10-CM | POA: Diagnosis not present

## 2015-06-03 DIAGNOSIS — M4806 Spinal stenosis, lumbar region: Secondary | ICD-10-CM | POA: Diagnosis not present

## 2015-06-04 DIAGNOSIS — M7552 Bursitis of left shoulder: Secondary | ICD-10-CM | POA: Diagnosis not present

## 2015-06-04 DIAGNOSIS — S4992XA Unspecified injury of left shoulder and upper arm, initial encounter: Secondary | ICD-10-CM | POA: Diagnosis not present

## 2015-06-04 DIAGNOSIS — M7542 Impingement syndrome of left shoulder: Secondary | ICD-10-CM | POA: Diagnosis not present

## 2015-06-04 DIAGNOSIS — M25512 Pain in left shoulder: Secondary | ICD-10-CM | POA: Diagnosis not present

## 2015-06-12 ENCOUNTER — Encounter: Payer: Self-pay | Admitting: Family

## 2015-06-13 DIAGNOSIS — S4992XA Unspecified injury of left shoulder and upper arm, initial encounter: Secondary | ICD-10-CM | POA: Diagnosis not present

## 2015-06-13 DIAGNOSIS — M25512 Pain in left shoulder: Secondary | ICD-10-CM | POA: Diagnosis not present

## 2015-06-13 DIAGNOSIS — M7542 Impingement syndrome of left shoulder: Secondary | ICD-10-CM | POA: Diagnosis not present

## 2015-06-13 DIAGNOSIS — M7552 Bursitis of left shoulder: Secondary | ICD-10-CM | POA: Diagnosis not present

## 2015-06-14 DIAGNOSIS — M25512 Pain in left shoulder: Secondary | ICD-10-CM | POA: Diagnosis not present

## 2015-06-14 DIAGNOSIS — M7542 Impingement syndrome of left shoulder: Secondary | ICD-10-CM | POA: Diagnosis not present

## 2015-06-14 DIAGNOSIS — S4992XA Unspecified injury of left shoulder and upper arm, initial encounter: Secondary | ICD-10-CM | POA: Diagnosis not present

## 2015-06-14 DIAGNOSIS — M7552 Bursitis of left shoulder: Secondary | ICD-10-CM | POA: Diagnosis not present

## 2015-06-19 DIAGNOSIS — M25512 Pain in left shoulder: Secondary | ICD-10-CM | POA: Diagnosis not present

## 2015-06-19 DIAGNOSIS — M7552 Bursitis of left shoulder: Secondary | ICD-10-CM | POA: Diagnosis not present

## 2015-06-19 DIAGNOSIS — M7542 Impingement syndrome of left shoulder: Secondary | ICD-10-CM | POA: Diagnosis not present

## 2015-06-19 DIAGNOSIS — S4992XA Unspecified injury of left shoulder and upper arm, initial encounter: Secondary | ICD-10-CM | POA: Diagnosis not present

## 2015-06-20 DIAGNOSIS — M7542 Impingement syndrome of left shoulder: Secondary | ICD-10-CM | POA: Diagnosis not present

## 2015-06-20 DIAGNOSIS — M25512 Pain in left shoulder: Secondary | ICD-10-CM | POA: Diagnosis not present

## 2015-06-20 DIAGNOSIS — M7552 Bursitis of left shoulder: Secondary | ICD-10-CM | POA: Diagnosis not present

## 2015-06-20 DIAGNOSIS — S4992XA Unspecified injury of left shoulder and upper arm, initial encounter: Secondary | ICD-10-CM | POA: Diagnosis not present

## 2015-06-24 ENCOUNTER — Ambulatory Visit: Payer: Self-pay | Admitting: Family

## 2015-06-24 ENCOUNTER — Encounter (HOSPITAL_COMMUNITY): Payer: Self-pay

## 2015-06-24 ENCOUNTER — Ambulatory Visit (INDEPENDENT_AMBULATORY_CARE_PROVIDER_SITE_OTHER)
Admission: RE | Admit: 2015-06-24 | Discharge: 2015-06-24 | Disposition: A | Discharge status: Home / Self Care | Payer: Medicare Other | Source: Ambulatory Visit | Attending: Surgery | Admitting: Surgery

## 2015-06-24 ENCOUNTER — Other Ambulatory Visit: Payer: Self-pay | Admitting: Surgery

## 2015-06-24 ENCOUNTER — Ambulatory Visit (INDEPENDENT_AMBULATORY_CARE_PROVIDER_SITE_OTHER): Payer: Medicare Other | Admitting: Family

## 2015-06-24 ENCOUNTER — Encounter: Payer: Self-pay | Admitting: Family

## 2015-06-24 ENCOUNTER — Ambulatory Visit (HOSPITAL_COMMUNITY)
Admission: RE | Admit: 2015-06-24 | Discharge: 2015-06-24 | Disposition: A | Discharge status: Home / Self Care | Payer: Medicare Other | Source: Ambulatory Visit | Attending: Family | Admitting: Family

## 2015-06-24 VITALS — BP 140/90 | HR 78 | Temp 98.3°F | Resp 14 | Ht 72.0 in | Wt 209.0 lb

## 2015-06-24 DIAGNOSIS — I70211 Atherosclerosis of native arteries of extremities with intermittent claudication, right leg: Secondary | ICD-10-CM | POA: Diagnosis not present

## 2015-06-24 DIAGNOSIS — I10 Essential (primary) hypertension: Secondary | ICD-10-CM | POA: Diagnosis not present

## 2015-06-24 DIAGNOSIS — I70219 Atherosclerosis of native arteries of extremities with intermittent claudication, unspecified extremity: Secondary | ICD-10-CM

## 2015-06-24 DIAGNOSIS — R0989 Other specified symptoms and signs involving the circulatory and respiratory systems: Secondary | ICD-10-CM | POA: Diagnosis present

## 2015-06-24 DIAGNOSIS — Z48812 Encounter for surgical aftercare following surgery on the circulatory system: Secondary | ICD-10-CM

## 2015-06-24 DIAGNOSIS — E119 Type 2 diabetes mellitus without complications: Secondary | ICD-10-CM | POA: Diagnosis not present

## 2015-06-24 DIAGNOSIS — Z87891 Personal history of nicotine dependence: Secondary | ICD-10-CM | POA: Diagnosis not present

## 2015-06-24 DIAGNOSIS — Z959 Presence of cardiac and vascular implant and graft, unspecified: Secondary | ICD-10-CM | POA: Diagnosis not present

## 2015-06-24 DIAGNOSIS — E785 Hyperlipidemia, unspecified: Secondary | ICD-10-CM | POA: Insufficient documentation

## 2015-06-24 NOTE — Patient Instructions (Signed)
Peripheral Vascular Disease Peripheral vascular disease (PVD) is a disease of the blood vessels that are not part of your heart and brain. A simple term for PVD is poor circulation. In most cases, PVD narrows the blood vessels that carry blood from your heart to the rest of your body. This can result in a decreased supply of blood to your arms, legs, and internal organs, like your stomach or kidneys. However, it most often affects a person's lower legs and feet. There are two types of PVD.  Organic PVD. This is the more common type. It is caused by damage to the structure of blood vessels.  Functional PVD. This is caused by conditions that make blood vessels contract and tighten (spasm). Without treatment, PVD tends to get worse over time. PVD can also lead to acute ischemic limb. This is when an arm or limb suddenly has trouble getting enough blood. This is a medical emergency. CAUSES Each type of PVD has many different causes. The most common cause of PVD is buildup of a fatty material (plaque) inside of your arteries (atherosclerosis). Small amounts of plaque can break off from the walls of the blood vessels and become lodged in a smaller artery. This blocks blood flow and can cause acute ischemic limb. Other common causes of PVD include:  Blood clots that form inside of blood vessels.  Injuries to blood vessels.  Diseases that cause inflammation of blood vessels or cause blood vessel spasms.  Health behaviors and health history that increase your risk of developing PVD. RISK FACTORS  You may have a greater risk of PVD if you:  Have a family history of PVD.  Have certain medical conditions, including:  High cholesterol.  Diabetes.  High blood pressure (hypertension).  Coronary heart disease.  Past problems with blood clots.  Past injury, such as burns or a broken bone. These may have damaged blood vessels in your limbs.  Buerger disease. This is caused by inflamed blood  vessels in your hands and feet.  Some forms of arthritis.  Rare birth defects that affect the arteries in your legs.  Use tobacco.  Do not get enough exercise.  Are obese.  Are age 50 or older. SIGNS AND SYMPTOMS  PVD may cause many different symptoms. Your symptoms depend on what part of your body is not getting enough blood. Some common signs and symptoms include:  Cramps in your lower legs. This may be a symptom of poor leg circulation (claudication).  Pain and weakness in your legs while you are physically active that goes away when you rest (intermittent claudication).  Leg pain when at rest.  Leg numbness, tingling, or weakness.  Coldness in a leg or foot, especially when compared with the other leg.  Skin or hair changes. These can include:  Hair loss.  Shiny skin.  Pale or bluish skin.  Thick toenails.  Inability to get or maintain an erection (erectile dysfunction). People with PVD are more prone to developing ulcers and sores on their toes, feet, or legs. These may take longer than normal to heal. DIAGNOSIS Your health care provider may diagnose PVD from your signs and symptoms. The health care provider will also do a physical exam. You may have tests to find out what is causing your PVD and determine its severity. Tests may include:  Blood pressure recordings from your arms and legs and measurements of the strength of your pulses (pulse volume recordings).  Imaging studies using sound waves to take pictures of   the blood flow through your blood vessels (Doppler ultrasound).  Injecting a dye into your blood vessels before having imaging studies using:  X-rays (angiogram or arteriogram).  Computer-generated X-rays (CT angiogram).  A powerful electromagnetic field and a computer (magnetic resonance angiogram or MRA). TREATMENT Treatment for PVD depends on the cause of your condition and the severity of your symptoms. It also depends on your age. Underlying  causes need to be treated and controlled. These include long-lasting (chronic) conditions, such as diabetes, high cholesterol, and high blood pressure. You may need to first try making lifestyle changes and taking medicines. Surgery may be needed if these do not work. Lifestyle changes may include:  Quitting smoking.  Exercising regularly.  Following a low-fat, low-cholesterol diet. Medicines may include:  Blood thinners to prevent blood clots.  Medicines to improve blood flow.  Medicines to improve your blood cholesterol levels. Surgical procedures may include:  A procedure that uses an inflated balloon to open a blocked artery and improve blood flow (angioplasty).  A procedure to put in a tube (stent) to keep a blocked artery open (stent implant).  Surgery to reroute blood flow around a blocked artery (peripheral bypass surgery).  Surgery to remove dead tissue from an infected wound on the affected limb.  Amputation. This is surgical removal of the affected limb. This may be necessary in cases of acute ischemic limb that are not improved through medical or surgical treatments. HOME CARE INSTRUCTIONS  Take medicines only as directed by your health care provider.  Do not use any tobacco products, including cigarettes, chewing tobacco, or electronic cigarettes. If you need help quitting, ask your health care provider.  Lose weight if you are overweight, and maintain a healthy weight as directed by your health care provider.  Eat a diet that is low in fat and cholesterol. If you need help, ask your health care provider.  Exercise regularly. Ask your health care provider to suggest some good activities for you.  Use compression stockings or other mechanical devices as directed by your health care provider.  Take good care of your feet.  Wear comfortable shoes that fit well.  Check your feet often for any cuts or sores. SEEK MEDICAL CARE IF:  You have cramps in your legs  while walking.  You have leg pain when you are at rest.  You have coldness in a leg or foot.  Your skin changes.  You have erectile dysfunction.  You have cuts or sores on your feet that are not healing. SEEK IMMEDIATE MEDICAL CARE IF:  Your arm or leg turns cold and blue.  Your arms or legs become red, warm, swollen, painful, or numb.  You have chest pain or trouble breathing.  You suddenly have weakness in your face, arm, or leg.  You become very confused or lose the ability to speak.  You suddenly have a very bad headache or lose your vision.   This information is not intended to replace advice given to you by your health care provider. Make sure you discuss any questions you have with your health care provider.   Document Released: 03/26/2004 Document Revised: 03/09/2014 Document Reviewed: 07/27/2013 Elsevier Interactive Patient Education 2016 [employer]  

## 2015-06-24 NOTE — Progress Notes (Signed)
[facility] [place]   CC: Follow up PAD  History of Present Illness Michael [name] is a 65 y.o. male patient of Dr.  Trula Slade who was initially seen by Dr. Kellie Simmering for right leg claudication. Dr. Trula Slade subsequently placed a right superficial femoral artery stent in September 2012. The patient has done very well since that time.Velocity elevations of 280 within the stent have been monitored. He remains asymptomatic.  He does complain of knee pain bilaterally which is an orthopedic issue.He remains very active, walks 3-4 miles/day.   He developed non healing wounds on the lateral aspect of his right knee, states this is VRE or VRSA, was biopsied by his PCP, he is using mupirocin ointment on this bid. Pt states the number of wounds are increasing in this area.   He intentionally lost 20 pounds since December 2016, is trying to improve his Type 2 DM.  Pt Diabetic: Yes, 8.2 A1C in January 2017 Pt smoker: non-smoker, quit smokeless tobacco use in 1983  Pt meds include: Statin :Yes Betablocker: No ASA: No Other anticoagulants/antiplatelets: Plavix  Past Medical History  Diagnosis Date  . Diabetes mellitus   . Hypertension   . Hyperlipidemia   . Anxiety   . Allergy     Social History Social History  Substance Use Topics  . Smoking status: Never Smoker   . Smokeless tobacco: Former Systems developer    Quit date: 10/26/1981  . Alcohol Use: 0.0 oz/week     Comment: occasional drink    Family History Family History  Problem Relation Age of Onset  . Heart disease Father   . Deep vein thrombosis Father   . Hyperlipidemia Father   . Hypertension Father   . Heart attack Father   . Peripheral vascular disease Father   . Hyperlipidemia Mother   . Hypertension Mother   . Cancer Sister     Past Surgical History  Procedure Laterality Date  . Rotator cuff repair      1990  . Knee arthroscopy    . Angioplasty / stenting femoral  11/05/10    Left SFA stent     Allergies  Allergen Reactions  . Actos [Pioglitazone]   . Byetta 10 Mcg Pen [Exenatide]   . Demerol   . Glimepiride     Edema   . Nabumetone   . Oxycodone   . Penicillins     Current Outpatient Prescriptions  Medication Sig Dispense Refill  . amLODipine (NORVASC) 10 MG tablet Take 10 mg by mouth daily.      Marland Kitchen atorvastatin (LIPITOR) 40 MG tablet Take 40 mg by mouth at bedtime.  5  . benazepril (LOTENSIN) 40 MG tablet Take 40 mg by mouth daily.      . Canagliflozin (INVOKANA) 300 MG TABS Take by mouth.    . clonazePAM (KLONOPIN) 0.5 MG tablet TAKE 1/2-1 TABLET DAILY AS NEEDED FOR SLEEP  5  . clopidogrel (PLAVIX) 75 MG tablet TAKE 1 TABLET (75 MG TOTAL) BY MOUTH DAILY WITH BREAKFAST. 30 tablet 10  . clopidogrel (PLAVIX) 75 MG tablet Take 1 tablet (75 mg total) by mouth daily with breakfast. 90 tablet 2  . ibuprofen (ADVIL,MOTRIN) 800 MG tablet Take 800 mg by mouth as needed for moderate pain.   0  . insulin glargine (LANTUS) 100 UNIT/ML injection Inject 20 Units into the skin at bedtime.     . meloxicam (MOBIC) 15 MG tablet Take 1 tablet (15 mg total) by mouth every morning. 90 tablet  1  . metFORMIN (GLUMETZA) 1000 MG (MOD) 24 hr tablet Take 1,000 mg by mouth 2 (two) times daily with a meal.     . NOVOLOG FLEXPEN 100 UNIT/ML FlexPen USE 11 UNITS IF BLOOD SUGAR 88 TO 126, THEN 1 UNIT FOR EVERY 40MG /DL ABOVE GOAL.  2  . rosuvastatin (CRESTOR) 10 MG tablet Take 10 mg by mouth daily.      . tamsulosin (FLOMAX) 0.4 MG CAPS capsule TAKE 1 CAPSULE BY MOUTH EVERY DAY AFTER DINNER  5  . VOLTAREN 1 % GEL Apply 1 application topically daily.     No current facility-administered medications for this visit.    REVIEW OF SYSTEMS: Cardiovascular: No chest pain, chest pressure, palpitations, orthopnea, or dyspnea on exertion. No claudication or rest pain, No history of DVT or phlebitis. Pulmonary: No productive cough, asthma or wheezing. Neurologic: No weakness, paresthesias, aphasia, or  amaurosis. No dizziness. Hematologic: No bleeding problems or clotting disorders. Musculoskeletal: Bilateral knee pain, left greater than right Gastrointestinal: No blood in stool or hematemesis Genitourinary: No dysuria or hematuria. Psychiatric:: No history of major depression. Integumentary: Plaque psoriasis on his back, abdomen, upper thighs, under treatment. Recent erythematous shallow dry lesions lateral aspect right knee, see HPI Constitutional: No fever or chills.    Physical Examination  Filed Vitals:   06/24/15 1545  BP: 140/90  Pulse: 78  Temp: 98.3 F (36.8 C)  TempSrc: Oral  Resp: 14  Height: 6' (1.829 m)  Weight: 209 lb (94.802 kg)  SpO2: 98%   Body mass index is 28.34 kg/(m^2).  General: The patient appears their stated age. HEENT: No gross abnormalities Pulmonary: Non labored breathing Musculoskeletal: There are no major deformities. Neurologic: No focal weakness or paresthesias are detected Skin: Several dermis deep small dry wounds with surrounding erythema lateral to right knee. Large areas of plaque psoriasis on his abdomen, lower back and left anterior thigh. Psychiatric: The patient has normal affect. Talkative. Cardiovascular: There is a regular rate and rhythm without significant murmur appreciated. No carotid bruits. Palpable left pedal pulses. Right pedal pulses are not palpable. Right femoral pulse is not palpable. Left femoral pulse is palpable.   Non-Invasive Vascular Imaging: DATE: 06/24/2015 ABI: RIGHT: 1.07 (0.99, 06/18/14), Waveforms: triphasic, TBI: 0.81;  LEFT: [place] (1.28), Waveforms: triphasic, TBI: 1.17   Right LE arterial Duplex: Unable to identify stent exit vs native outflow due to calcific shadow. Elevated velocities at stent entry (270 cm/s) and in distal thigh (335 cm/s). All triphasic waveforms.   ASSESSMENT: Michael [name] is a 65 y.o. male who is s/p right superficial femoral artery stent in September 2012 for right leg  claudication.   His claudication has resolved since the above procedure. Right SFA stent has been monitored for stenosis in the upper 200's velocity cm/s.   Today's right LE arterial duplex: unable to identify stent exit vs native outflow due to calcific shadow. Elevated velocities at stent entry (270 cm/s) and in distal thigh (335 cm/s). All triphasic waveforms.  TBI's remain normal bilaterally with all triphasic waveforms. Right ABI is in the normal range. Left ankle arteries are non compressible indicating the presence of tibial artery medical calcification (a complication of chronic DM).   Pt's DM is uncontrolled with his last reported A1C of 8.2. Pt has never smoked and he stopped smokeless tobacco use in 1983. He takes a statin and Plavix.  Pt has been seeing a dermatologist for psoriasis. I advised him to see his dermatologist re the several small shallow  erythematous lesions on the lateral aspect of his right knee.   PLAN:  Based on the patient's vascular studies and examination, and after discussing with Dr. Trula Slade, pt will return to clinic in 6 months with ABI's and right LE arterial duplex, see me on a day that Dr. Trula Slade is in the office.  I discussed in depth with the patient the nature of atherosclerosis, and emphasized the importance of maximal medical management including strict control of blood pressure, blood glucose, and lipid levels, obtaining regular exercise, and continued cessation of smoking.  The patient is aware that without maximal medical management the underlying atherosclerotic disease process will progress, limiting the benefit of any interventions.  The patient was given information about PAD including signs, symptoms, treatment, what symptoms should prompt the patient to seek immediate medical care, and risk reduction measures to take.  Clemon Chambers, RN, MSN, FNP-C [facility] Phone: [phone]  Clinic MD:  Trula Slade  06/24/2015 3:54 PM

## 2015-06-25 DIAGNOSIS — S4992XA Unspecified injury of left shoulder and upper arm, initial encounter: Secondary | ICD-10-CM | POA: Diagnosis not present

## 2015-06-25 DIAGNOSIS — M7542 Impingement syndrome of left shoulder: Secondary | ICD-10-CM | POA: Diagnosis not present

## 2015-06-25 DIAGNOSIS — M7552 Bursitis of left shoulder: Secondary | ICD-10-CM | POA: Diagnosis not present

## 2015-06-25 DIAGNOSIS — M25512 Pain in left shoulder: Secondary | ICD-10-CM | POA: Diagnosis not present

## 2015-06-26 DIAGNOSIS — M7552 Bursitis of left shoulder: Secondary | ICD-10-CM | POA: Diagnosis not present

## 2015-06-26 DIAGNOSIS — S4992XA Unspecified injury of left shoulder and upper arm, initial encounter: Secondary | ICD-10-CM | POA: Diagnosis not present

## 2015-06-26 DIAGNOSIS — M7542 Impingement syndrome of left shoulder: Secondary | ICD-10-CM | POA: Diagnosis not present

## 2015-06-26 DIAGNOSIS — M25512 Pain in left shoulder: Secondary | ICD-10-CM | POA: Diagnosis not present

## 2015-06-27 DIAGNOSIS — L400 Psoriasis vulgaris: Secondary | ICD-10-CM | POA: Diagnosis not present

## 2015-06-27 DIAGNOSIS — L97811 Non-pressure chronic ulcer of other part of right lower leg limited to breakdown of skin: Secondary | ICD-10-CM | POA: Diagnosis not present

## 2015-06-27 DIAGNOSIS — D2261 Melanocytic nevi of right upper limb, including shoulder: Secondary | ICD-10-CM | POA: Diagnosis not present

## 2015-06-27 DIAGNOSIS — D485 Neoplasm of uncertain behavior of skin: Secondary | ICD-10-CM | POA: Diagnosis not present

## 2015-06-27 DIAGNOSIS — D225 Melanocytic nevi of trunk: Secondary | ICD-10-CM | POA: Diagnosis not present

## 2015-06-27 DIAGNOSIS — L814 Other melanin hyperpigmentation: Secondary | ICD-10-CM | POA: Diagnosis not present

## 2015-07-01 DIAGNOSIS — S4982XD Other specified injuries of left shoulder and upper arm, subsequent encounter: Secondary | ICD-10-CM | POA: Diagnosis not present

## 2015-07-01 DIAGNOSIS — M25512 Pain in left shoulder: Secondary | ICD-10-CM | POA: Diagnosis not present

## 2015-07-01 DIAGNOSIS — M7552 Bursitis of left shoulder: Secondary | ICD-10-CM | POA: Diagnosis not present

## 2015-07-01 DIAGNOSIS — M7542 Impingement syndrome of left shoulder: Secondary | ICD-10-CM | POA: Diagnosis not present

## 2015-07-02 DIAGNOSIS — S4992XA Unspecified injury of left shoulder and upper arm, initial encounter: Secondary | ICD-10-CM | POA: Diagnosis not present

## 2015-07-02 DIAGNOSIS — M7542 Impingement syndrome of left shoulder: Secondary | ICD-10-CM | POA: Diagnosis not present

## 2015-07-02 DIAGNOSIS — M7552 Bursitis of left shoulder: Secondary | ICD-10-CM | POA: Diagnosis not present

## 2015-07-02 DIAGNOSIS — M25512 Pain in left shoulder: Secondary | ICD-10-CM | POA: Diagnosis not present

## 2015-07-03 ENCOUNTER — Other Ambulatory Visit: Payer: Self-pay | Admitting: Podiatry

## 2015-07-11 DIAGNOSIS — M7552 Bursitis of left shoulder: Secondary | ICD-10-CM | POA: Diagnosis not present

## 2015-07-11 DIAGNOSIS — M7542 Impingement syndrome of left shoulder: Secondary | ICD-10-CM | POA: Diagnosis not present

## 2015-07-11 DIAGNOSIS — S4992XA Unspecified injury of left shoulder and upper arm, initial encounter: Secondary | ICD-10-CM | POA: Diagnosis not present

## 2015-07-11 DIAGNOSIS — M25512 Pain in left shoulder: Secondary | ICD-10-CM | POA: Diagnosis not present

## 2015-07-12 DIAGNOSIS — M7542 Impingement syndrome of left shoulder: Secondary | ICD-10-CM | POA: Diagnosis not present

## 2015-07-12 DIAGNOSIS — M7552 Bursitis of left shoulder: Secondary | ICD-10-CM | POA: Diagnosis not present

## 2015-07-12 DIAGNOSIS — M25512 Pain in left shoulder: Secondary | ICD-10-CM | POA: Diagnosis not present

## 2015-07-12 DIAGNOSIS — S4992XA Unspecified injury of left shoulder and upper arm, initial encounter: Secondary | ICD-10-CM | POA: Diagnosis not present

## 2015-07-16 DIAGNOSIS — S4992XA Unspecified injury of left shoulder and upper arm, initial encounter: Secondary | ICD-10-CM | POA: Diagnosis not present

## 2015-07-16 DIAGNOSIS — M7552 Bursitis of left shoulder: Secondary | ICD-10-CM | POA: Diagnosis not present

## 2015-07-16 DIAGNOSIS — M7542 Impingement syndrome of left shoulder: Secondary | ICD-10-CM | POA: Diagnosis not present

## 2015-07-16 DIAGNOSIS — M25512 Pain in left shoulder: Secondary | ICD-10-CM | POA: Diagnosis not present

## 2015-07-18 DIAGNOSIS — S4992XA Unspecified injury of left shoulder and upper arm, initial encounter: Secondary | ICD-10-CM | POA: Diagnosis not present

## 2015-07-18 DIAGNOSIS — M7542 Impingement syndrome of left shoulder: Secondary | ICD-10-CM | POA: Diagnosis not present

## 2015-07-18 DIAGNOSIS — M25512 Pain in left shoulder: Secondary | ICD-10-CM | POA: Diagnosis not present

## 2015-07-18 DIAGNOSIS — M7552 Bursitis of left shoulder: Secondary | ICD-10-CM | POA: Diagnosis not present

## 2015-07-22 DIAGNOSIS — M7542 Impingement syndrome of left shoulder: Secondary | ICD-10-CM | POA: Diagnosis not present

## 2015-07-22 DIAGNOSIS — M25512 Pain in left shoulder: Secondary | ICD-10-CM | POA: Diagnosis not present

## 2015-07-22 DIAGNOSIS — M7552 Bursitis of left shoulder: Secondary | ICD-10-CM | POA: Diagnosis not present

## 2015-07-22 DIAGNOSIS — S4992XA Unspecified injury of left shoulder and upper arm, initial encounter: Secondary | ICD-10-CM | POA: Diagnosis not present

## 2015-07-24 DIAGNOSIS — M7552 Bursitis of left shoulder: Secondary | ICD-10-CM | POA: Diagnosis not present

## 2015-07-24 DIAGNOSIS — M7542 Impingement syndrome of left shoulder: Secondary | ICD-10-CM | POA: Diagnosis not present

## 2015-07-24 DIAGNOSIS — M25512 Pain in left shoulder: Secondary | ICD-10-CM | POA: Diagnosis not present

## 2015-07-24 DIAGNOSIS — S4992XA Unspecified injury of left shoulder and upper arm, initial encounter: Secondary | ICD-10-CM | POA: Diagnosis not present

## 2015-07-30 NOTE — Addendum Note (Signed)
Addended by: Mena Goes on: 07/30/2015 04:08 PM   Modules accepted: Orders

## 2015-08-01 DIAGNOSIS — S4992XA Unspecified injury of left shoulder and upper arm, initial encounter: Secondary | ICD-10-CM | POA: Diagnosis not present

## 2015-08-01 DIAGNOSIS — M7542 Impingement syndrome of left shoulder: Secondary | ICD-10-CM | POA: Diagnosis not present

## 2015-08-01 DIAGNOSIS — M7552 Bursitis of left shoulder: Secondary | ICD-10-CM | POA: Diagnosis not present

## 2015-08-01 DIAGNOSIS — M25512 Pain in left shoulder: Secondary | ICD-10-CM | POA: Diagnosis not present

## 2015-08-05 DIAGNOSIS — S4982XD Other specified injuries of left shoulder and upper arm, subsequent encounter: Secondary | ICD-10-CM | POA: Diagnosis not present

## 2015-08-05 DIAGNOSIS — M7542 Impingement syndrome of left shoulder: Secondary | ICD-10-CM | POA: Diagnosis not present

## 2015-08-05 DIAGNOSIS — M7552 Bursitis of left shoulder: Secondary | ICD-10-CM | POA: Diagnosis not present

## 2015-08-05 DIAGNOSIS — M25512 Pain in left shoulder: Secondary | ICD-10-CM | POA: Diagnosis not present

## 2015-08-07 DIAGNOSIS — M549 Dorsalgia, unspecified: Secondary | ICD-10-CM | POA: Diagnosis not present

## 2015-08-14 DIAGNOSIS — M7542 Impingement syndrome of left shoulder: Secondary | ICD-10-CM | POA: Diagnosis not present

## 2015-08-26 DIAGNOSIS — E1151 Type 2 diabetes mellitus with diabetic peripheral angiopathy without gangrene: Secondary | ICD-10-CM | POA: Diagnosis not present

## 2015-08-26 DIAGNOSIS — E230 Hypopituitarism: Secondary | ICD-10-CM | POA: Diagnosis not present

## 2015-08-26 DIAGNOSIS — R195 Other fecal abnormalities: Secondary | ICD-10-CM | POA: Diagnosis not present

## 2015-08-26 DIAGNOSIS — Z794 Long term (current) use of insulin: Secondary | ICD-10-CM | POA: Diagnosis not present

## 2015-08-29 DIAGNOSIS — L400 Psoriasis vulgaris: Secondary | ICD-10-CM | POA: Diagnosis not present

## 2015-09-04 DIAGNOSIS — M7542 Impingement syndrome of left shoulder: Secondary | ICD-10-CM | POA: Diagnosis not present

## 2015-09-04 DIAGNOSIS — S46812D Strain of other muscles, fascia and tendons at shoulder and upper arm level, left arm, subsequent encounter: Secondary | ICD-10-CM | POA: Diagnosis not present

## 2015-09-04 DIAGNOSIS — S4982XD Other specified injuries of left shoulder and upper arm, subsequent encounter: Secondary | ICD-10-CM | POA: Diagnosis not present

## 2015-09-04 DIAGNOSIS — M25512 Pain in left shoulder: Secondary | ICD-10-CM | POA: Diagnosis not present

## 2015-09-12 DIAGNOSIS — G479 Sleep disorder, unspecified: Secondary | ICD-10-CM | POA: Diagnosis not present

## 2015-09-12 DIAGNOSIS — N529 Male erectile dysfunction, unspecified: Secondary | ICD-10-CM | POA: Diagnosis not present

## 2015-09-12 DIAGNOSIS — N401 Enlarged prostate with lower urinary tract symptoms: Secondary | ICD-10-CM | POA: Diagnosis not present

## 2015-09-12 DIAGNOSIS — L98499 Non-pressure chronic ulcer of skin of other sites with unspecified severity: Secondary | ICD-10-CM | POA: Diagnosis not present

## 2015-09-12 DIAGNOSIS — I10 Essential (primary) hypertension: Secondary | ICD-10-CM | POA: Diagnosis not present

## 2015-09-16 DIAGNOSIS — E119 Type 2 diabetes mellitus without complications: Secondary | ICD-10-CM | POA: Diagnosis not present

## 2015-09-16 DIAGNOSIS — Z794 Long term (current) use of insulin: Secondary | ICD-10-CM | POA: Diagnosis not present

## 2015-09-25 ENCOUNTER — Encounter (HOSPITAL_BASED_OUTPATIENT_CLINIC_OR_DEPARTMENT_OTHER): Payer: Medicare Other | Attending: Surgery

## 2015-09-25 DIAGNOSIS — E1122 Type 2 diabetes mellitus with diabetic chronic kidney disease: Secondary | ICD-10-CM | POA: Insufficient documentation

## 2015-09-25 DIAGNOSIS — E11622 Type 2 diabetes mellitus with other skin ulcer: Secondary | ICD-10-CM | POA: Diagnosis not present

## 2015-09-25 DIAGNOSIS — Z794 Long term (current) use of insulin: Secondary | ICD-10-CM | POA: Insufficient documentation

## 2015-09-25 DIAGNOSIS — I70232 Atherosclerosis of native arteries of right leg with ulceration of calf: Secondary | ICD-10-CM | POA: Diagnosis not present

## 2015-09-25 DIAGNOSIS — I129 Hypertensive chronic kidney disease with stage 1 through stage 4 chronic kidney disease, or unspecified chronic kidney disease: Secondary | ICD-10-CM | POA: Insufficient documentation

## 2015-09-25 DIAGNOSIS — Z791 Long term (current) use of non-steroidal anti-inflammatories (NSAID): Secondary | ICD-10-CM | POA: Insufficient documentation

## 2015-09-25 DIAGNOSIS — Z7902 Long term (current) use of antithrombotics/antiplatelets: Secondary | ICD-10-CM | POA: Diagnosis not present

## 2015-09-25 DIAGNOSIS — Z79899 Other long term (current) drug therapy: Secondary | ICD-10-CM | POA: Insufficient documentation

## 2015-09-25 DIAGNOSIS — Z955 Presence of coronary angioplasty implant and graft: Secondary | ICD-10-CM | POA: Diagnosis not present

## 2015-09-25 DIAGNOSIS — L97811 Non-pressure chronic ulcer of other part of right lower leg limited to breakdown of skin: Secondary | ICD-10-CM | POA: Diagnosis not present

## 2015-09-25 DIAGNOSIS — L97212 Non-pressure chronic ulcer of right calf with fat layer exposed: Secondary | ICD-10-CM | POA: Diagnosis not present

## 2015-09-25 DIAGNOSIS — E785 Hyperlipidemia, unspecified: Secondary | ICD-10-CM | POA: Insufficient documentation

## 2015-09-25 DIAGNOSIS — N189 Chronic kidney disease, unspecified: Secondary | ICD-10-CM | POA: Insufficient documentation

## 2015-09-25 DIAGNOSIS — E1151 Type 2 diabetes mellitus with diabetic peripheral angiopathy without gangrene: Secondary | ICD-10-CM | POA: Insufficient documentation

## 2015-10-01 DIAGNOSIS — S43432D Superior glenoid labrum lesion of left shoulder, subsequent encounter: Secondary | ICD-10-CM | POA: Diagnosis not present

## 2015-10-01 DIAGNOSIS — S46812D Strain of other muscles, fascia and tendons at shoulder and upper arm level, left arm, subsequent encounter: Secondary | ICD-10-CM | POA: Diagnosis not present

## 2015-10-02 ENCOUNTER — Encounter (HOSPITAL_BASED_OUTPATIENT_CLINIC_OR_DEPARTMENT_OTHER): Payer: Medicare Other | Attending: Surgery

## 2015-10-02 DIAGNOSIS — L97811 Non-pressure chronic ulcer of other part of right lower leg limited to breakdown of skin: Secondary | ICD-10-CM | POA: Diagnosis not present

## 2015-10-02 DIAGNOSIS — E1122 Type 2 diabetes mellitus with diabetic chronic kidney disease: Secondary | ICD-10-CM | POA: Diagnosis not present

## 2015-10-02 DIAGNOSIS — N189 Chronic kidney disease, unspecified: Secondary | ICD-10-CM | POA: Insufficient documentation

## 2015-10-02 DIAGNOSIS — E11622 Type 2 diabetes mellitus with other skin ulcer: Secondary | ICD-10-CM | POA: Insufficient documentation

## 2015-10-02 DIAGNOSIS — E1151 Type 2 diabetes mellitus with diabetic peripheral angiopathy without gangrene: Secondary | ICD-10-CM | POA: Diagnosis not present

## 2015-10-02 DIAGNOSIS — Z6828 Body mass index (BMI) 28.0-28.9, adult: Secondary | ICD-10-CM | POA: Insufficient documentation

## 2015-10-02 DIAGNOSIS — L97812 Non-pressure chronic ulcer of other part of right lower leg with fat layer exposed: Secondary | ICD-10-CM | POA: Insufficient documentation

## 2015-10-02 DIAGNOSIS — I70232 Atherosclerosis of native arteries of right leg with ulceration of calf: Secondary | ICD-10-CM | POA: Diagnosis not present

## 2015-10-02 DIAGNOSIS — I129 Hypertensive chronic kidney disease with stage 1 through stage 4 chronic kidney disease, or unspecified chronic kidney disease: Secondary | ICD-10-CM | POA: Insufficient documentation

## 2015-10-02 DIAGNOSIS — E669 Obesity, unspecified: Secondary | ICD-10-CM | POA: Insufficient documentation

## 2015-10-02 DIAGNOSIS — E785 Hyperlipidemia, unspecified: Secondary | ICD-10-CM | POA: Diagnosis not present

## 2015-10-02 DIAGNOSIS — I70238 Atherosclerosis of native arteries of right leg with ulceration of other part of lower right leg: Secondary | ICD-10-CM | POA: Diagnosis not present

## 2015-10-03 ENCOUNTER — Telehealth: Payer: Self-pay

## 2015-10-03 NOTE — Telephone Encounter (Signed)
**Note De-Identified  Obfuscation** The pt needs to have Left Shoulder: left shoulder scope, A-SAD, mini- open RCR, Biceps tenodesis, RCR-mini-open chronic/over 3 months, Biceps Tenodesis-open pending this clearance.  They are requesting the following: 1. Cardiac Clearance. 2. The pt is taking Plavix 75 mg daily-can it be held and if so for how long? 3. If there are any contraindications or recommendations for the pt prior to surgery, during the procedure or in the post operative period.  Please advise.

## 2015-10-09 DIAGNOSIS — L97812 Non-pressure chronic ulcer of other part of right lower leg with fat layer exposed: Secondary | ICD-10-CM | POA: Diagnosis not present

## 2015-10-09 DIAGNOSIS — E785 Hyperlipidemia, unspecified: Secondary | ICD-10-CM | POA: Diagnosis not present

## 2015-10-09 DIAGNOSIS — E11622 Type 2 diabetes mellitus with other skin ulcer: Secondary | ICD-10-CM | POA: Diagnosis not present

## 2015-10-09 DIAGNOSIS — I70238 Atherosclerosis of native arteries of right leg with ulceration of other part of lower right leg: Secondary | ICD-10-CM | POA: Diagnosis not present

## 2015-10-09 DIAGNOSIS — E669 Obesity, unspecified: Secondary | ICD-10-CM | POA: Diagnosis not present

## 2015-10-09 DIAGNOSIS — E1151 Type 2 diabetes mellitus with diabetic peripheral angiopathy without gangrene: Secondary | ICD-10-CM | POA: Diagnosis not present

## 2015-10-09 NOTE — Telephone Encounter (Signed)
OK to hold plavix 5 days prior to surgery. No further cardiac testing needed before surgery.

## 2015-10-09 NOTE — Telephone Encounter (Signed)
**Note De-Identified  Obfuscation** I manually faxed this message to [place] @ [phone] ATTN: Santiago Bur. I did receive conformation that the fax was successful.

## 2015-10-16 DIAGNOSIS — E669 Obesity, unspecified: Secondary | ICD-10-CM | POA: Diagnosis not present

## 2015-10-16 DIAGNOSIS — L97212 Non-pressure chronic ulcer of right calf with fat layer exposed: Secondary | ICD-10-CM | POA: Diagnosis not present

## 2015-10-16 DIAGNOSIS — I70238 Atherosclerosis of native arteries of right leg with ulceration of other part of lower right leg: Secondary | ICD-10-CM | POA: Diagnosis not present

## 2015-10-16 DIAGNOSIS — L97812 Non-pressure chronic ulcer of other part of right lower leg with fat layer exposed: Secondary | ICD-10-CM | POA: Diagnosis not present

## 2015-10-16 DIAGNOSIS — E785 Hyperlipidemia, unspecified: Secondary | ICD-10-CM | POA: Diagnosis not present

## 2015-10-16 DIAGNOSIS — E11622 Type 2 diabetes mellitus with other skin ulcer: Secondary | ICD-10-CM | POA: Diagnosis not present

## 2015-10-16 DIAGNOSIS — E1151 Type 2 diabetes mellitus with diabetic peripheral angiopathy without gangrene: Secondary | ICD-10-CM | POA: Diagnosis not present

## 2015-10-18 DIAGNOSIS — Z23 Encounter for immunization: Secondary | ICD-10-CM | POA: Diagnosis not present

## 2015-10-23 DIAGNOSIS — E669 Obesity, unspecified: Secondary | ICD-10-CM | POA: Diagnosis not present

## 2015-10-23 DIAGNOSIS — L97212 Non-pressure chronic ulcer of right calf with fat layer exposed: Secondary | ICD-10-CM | POA: Diagnosis not present

## 2015-10-23 DIAGNOSIS — E1151 Type 2 diabetes mellitus with diabetic peripheral angiopathy without gangrene: Secondary | ICD-10-CM | POA: Diagnosis not present

## 2015-10-23 DIAGNOSIS — I70232 Atherosclerosis of native arteries of right leg with ulceration of calf: Secondary | ICD-10-CM | POA: Diagnosis not present

## 2015-10-23 DIAGNOSIS — L97812 Non-pressure chronic ulcer of other part of right lower leg with fat layer exposed: Secondary | ICD-10-CM | POA: Diagnosis not present

## 2015-10-23 DIAGNOSIS — E11622 Type 2 diabetes mellitus with other skin ulcer: Secondary | ICD-10-CM | POA: Diagnosis not present

## 2015-10-23 DIAGNOSIS — E785 Hyperlipidemia, unspecified: Secondary | ICD-10-CM | POA: Diagnosis not present

## 2015-10-23 DIAGNOSIS — I70238 Atherosclerosis of native arteries of right leg with ulceration of other part of lower right leg: Secondary | ICD-10-CM | POA: Diagnosis not present

## 2015-10-30 DIAGNOSIS — I7389 Other specified peripheral vascular diseases: Secondary | ICD-10-CM | POA: Diagnosis not present

## 2015-10-30 DIAGNOSIS — E669 Obesity, unspecified: Secondary | ICD-10-CM | POA: Diagnosis not present

## 2015-10-30 DIAGNOSIS — L97812 Non-pressure chronic ulcer of other part of right lower leg with fat layer exposed: Secondary | ICD-10-CM | POA: Diagnosis not present

## 2015-10-30 DIAGNOSIS — E11622 Type 2 diabetes mellitus with other skin ulcer: Secondary | ICD-10-CM | POA: Diagnosis not present

## 2015-10-30 DIAGNOSIS — E1151 Type 2 diabetes mellitus with diabetic peripheral angiopathy without gangrene: Secondary | ICD-10-CM | POA: Diagnosis not present

## 2015-10-30 DIAGNOSIS — E785 Hyperlipidemia, unspecified: Secondary | ICD-10-CM | POA: Diagnosis not present

## 2015-10-30 DIAGNOSIS — I70238 Atherosclerosis of native arteries of right leg with ulceration of other part of lower right leg: Secondary | ICD-10-CM | POA: Diagnosis not present

## 2015-11-06 ENCOUNTER — Encounter (HOSPITAL_BASED_OUTPATIENT_CLINIC_OR_DEPARTMENT_OTHER): Payer: Medicare Other | Attending: Surgery

## 2015-11-06 DIAGNOSIS — E1151 Type 2 diabetes mellitus with diabetic peripheral angiopathy without gangrene: Secondary | ICD-10-CM | POA: Diagnosis not present

## 2015-11-06 DIAGNOSIS — E669 Obesity, unspecified: Secondary | ICD-10-CM | POA: Diagnosis not present

## 2015-11-06 DIAGNOSIS — E11622 Type 2 diabetes mellitus with other skin ulcer: Secondary | ICD-10-CM | POA: Insufficient documentation

## 2015-11-06 DIAGNOSIS — Z6829 Body mass index (BMI) 29.0-29.9, adult: Secondary | ICD-10-CM | POA: Diagnosis not present

## 2015-11-06 DIAGNOSIS — L97212 Non-pressure chronic ulcer of right calf with fat layer exposed: Secondary | ICD-10-CM | POA: Diagnosis not present

## 2015-11-06 DIAGNOSIS — I70238 Atherosclerosis of native arteries of right leg with ulceration of other part of lower right leg: Secondary | ICD-10-CM | POA: Diagnosis not present

## 2015-11-06 DIAGNOSIS — L97812 Non-pressure chronic ulcer of other part of right lower leg with fat layer exposed: Secondary | ICD-10-CM | POA: Insufficient documentation

## 2015-11-06 DIAGNOSIS — N189 Chronic kidney disease, unspecified: Secondary | ICD-10-CM | POA: Diagnosis not present

## 2015-11-07 DIAGNOSIS — S43432D Superior glenoid labrum lesion of left shoulder, subsequent encounter: Secondary | ICD-10-CM | POA: Diagnosis not present

## 2015-11-07 DIAGNOSIS — S46812D Strain of other muscles, fascia and tendons at shoulder and upper arm level, left arm, subsequent encounter: Secondary | ICD-10-CM | POA: Diagnosis not present

## 2015-11-11 DIAGNOSIS — M7542 Impingement syndrome of left shoulder: Secondary | ICD-10-CM | POA: Diagnosis not present

## 2015-11-11 DIAGNOSIS — S43422A Sprain of left rotator cuff capsule, initial encounter: Secondary | ICD-10-CM | POA: Diagnosis not present

## 2015-11-11 DIAGNOSIS — S46112A Strain of muscle, fascia and tendon of long head of biceps, left arm, initial encounter: Secondary | ICD-10-CM | POA: Diagnosis not present

## 2015-11-11 DIAGNOSIS — S43012A Anterior subluxation of left humerus, initial encounter: Secondary | ICD-10-CM | POA: Diagnosis not present

## 2015-11-11 DIAGNOSIS — M7552 Bursitis of left shoulder: Secondary | ICD-10-CM | POA: Diagnosis not present

## 2015-11-11 DIAGNOSIS — G8918 Other acute postprocedural pain: Secondary | ICD-10-CM | POA: Diagnosis not present

## 2015-11-11 DIAGNOSIS — S46012A Strain of muscle(s) and tendon(s) of the rotator cuff of left shoulder, initial encounter: Secondary | ICD-10-CM | POA: Diagnosis not present

## 2015-11-11 DIAGNOSIS — S43432A Superior glenoid labrum lesion of left shoulder, initial encounter: Secondary | ICD-10-CM | POA: Diagnosis not present

## 2015-11-11 DIAGNOSIS — Y999 Unspecified external cause status: Secondary | ICD-10-CM | POA: Diagnosis not present

## 2015-11-14 DIAGNOSIS — S46812D Strain of other muscles, fascia and tendons at shoulder and upper arm level, left arm, subsequent encounter: Secondary | ICD-10-CM | POA: Diagnosis not present

## 2015-11-20 DIAGNOSIS — I70238 Atherosclerosis of native arteries of right leg with ulceration of other part of lower right leg: Secondary | ICD-10-CM | POA: Diagnosis not present

## 2015-11-20 DIAGNOSIS — N189 Chronic kidney disease, unspecified: Secondary | ICD-10-CM | POA: Diagnosis not present

## 2015-11-20 DIAGNOSIS — L97812 Non-pressure chronic ulcer of other part of right lower leg with fat layer exposed: Secondary | ICD-10-CM | POA: Diagnosis not present

## 2015-11-20 DIAGNOSIS — L98492 Non-pressure chronic ulcer of skin of other sites with fat layer exposed: Secondary | ICD-10-CM | POA: Diagnosis not present

## 2015-11-20 DIAGNOSIS — E11622 Type 2 diabetes mellitus with other skin ulcer: Secondary | ICD-10-CM | POA: Diagnosis not present

## 2015-11-20 DIAGNOSIS — S46812D Strain of other muscles, fascia and tendons at shoulder and upper arm level, left arm, subsequent encounter: Secondary | ICD-10-CM | POA: Diagnosis not present

## 2015-11-20 DIAGNOSIS — E669 Obesity, unspecified: Secondary | ICD-10-CM | POA: Diagnosis not present

## 2015-11-20 DIAGNOSIS — E1151 Type 2 diabetes mellitus with diabetic peripheral angiopathy without gangrene: Secondary | ICD-10-CM | POA: Diagnosis not present

## 2015-11-20 DIAGNOSIS — I70232 Atherosclerosis of native arteries of right leg with ulceration of calf: Secondary | ICD-10-CM | POA: Diagnosis not present

## 2015-11-21 DIAGNOSIS — Z4789 Encounter for other orthopedic aftercare: Secondary | ICD-10-CM | POA: Diagnosis not present

## 2015-11-21 DIAGNOSIS — S43432D Superior glenoid labrum lesion of left shoulder, subsequent encounter: Secondary | ICD-10-CM | POA: Diagnosis not present

## 2015-11-25 DIAGNOSIS — S46812D Strain of other muscles, fascia and tendons at shoulder and upper arm level, left arm, subsequent encounter: Secondary | ICD-10-CM | POA: Diagnosis not present

## 2015-11-27 DIAGNOSIS — E669 Obesity, unspecified: Secondary | ICD-10-CM | POA: Diagnosis not present

## 2015-11-27 DIAGNOSIS — L97212 Non-pressure chronic ulcer of right calf with fat layer exposed: Secondary | ICD-10-CM | POA: Diagnosis not present

## 2015-11-27 DIAGNOSIS — N189 Chronic kidney disease, unspecified: Secondary | ICD-10-CM | POA: Diagnosis not present

## 2015-11-27 DIAGNOSIS — I70232 Atherosclerosis of native arteries of right leg with ulceration of calf: Secondary | ICD-10-CM | POA: Diagnosis not present

## 2015-11-27 DIAGNOSIS — I70238 Atherosclerosis of native arteries of right leg with ulceration of other part of lower right leg: Secondary | ICD-10-CM | POA: Diagnosis not present

## 2015-11-27 DIAGNOSIS — E11622 Type 2 diabetes mellitus with other skin ulcer: Secondary | ICD-10-CM | POA: Diagnosis not present

## 2015-11-27 DIAGNOSIS — L97812 Non-pressure chronic ulcer of other part of right lower leg with fat layer exposed: Secondary | ICD-10-CM | POA: Diagnosis not present

## 2015-11-27 DIAGNOSIS — E1151 Type 2 diabetes mellitus with diabetic peripheral angiopathy without gangrene: Secondary | ICD-10-CM | POA: Diagnosis not present

## 2015-11-28 DIAGNOSIS — S46812D Strain of other muscles, fascia and tendons at shoulder and upper arm level, left arm, subsequent encounter: Secondary | ICD-10-CM | POA: Diagnosis not present

## 2015-12-02 DIAGNOSIS — E230 Hypopituitarism: Secondary | ICD-10-CM | POA: Diagnosis not present

## 2015-12-02 DIAGNOSIS — E1151 Type 2 diabetes mellitus with diabetic peripheral angiopathy without gangrene: Secondary | ICD-10-CM | POA: Diagnosis not present

## 2015-12-02 DIAGNOSIS — Z79899 Other long term (current) drug therapy: Secondary | ICD-10-CM | POA: Diagnosis not present

## 2015-12-02 DIAGNOSIS — Z794 Long term (current) use of insulin: Secondary | ICD-10-CM | POA: Diagnosis not present

## 2015-12-03 DIAGNOSIS — S46812D Strain of other muscles, fascia and tendons at shoulder and upper arm level, left arm, subsequent encounter: Secondary | ICD-10-CM | POA: Diagnosis not present

## 2015-12-04 ENCOUNTER — Encounter (HOSPITAL_BASED_OUTPATIENT_CLINIC_OR_DEPARTMENT_OTHER): Payer: Medicare Other | Attending: Surgery

## 2015-12-04 DIAGNOSIS — N189 Chronic kidney disease, unspecified: Secondary | ICD-10-CM | POA: Insufficient documentation

## 2015-12-04 DIAGNOSIS — E1151 Type 2 diabetes mellitus with diabetic peripheral angiopathy without gangrene: Secondary | ICD-10-CM | POA: Insufficient documentation

## 2015-12-04 DIAGNOSIS — E1122 Type 2 diabetes mellitus with diabetic chronic kidney disease: Secondary | ICD-10-CM | POA: Insufficient documentation

## 2015-12-04 DIAGNOSIS — L97812 Non-pressure chronic ulcer of other part of right lower leg with fat layer exposed: Secondary | ICD-10-CM | POA: Insufficient documentation

## 2015-12-04 DIAGNOSIS — E11622 Type 2 diabetes mellitus with other skin ulcer: Secondary | ICD-10-CM | POA: Insufficient documentation

## 2015-12-04 DIAGNOSIS — I70232 Atherosclerosis of native arteries of right leg with ulceration of calf: Secondary | ICD-10-CM | POA: Diagnosis not present

## 2015-12-04 DIAGNOSIS — I129 Hypertensive chronic kidney disease with stage 1 through stage 4 chronic kidney disease, or unspecified chronic kidney disease: Secondary | ICD-10-CM | POA: Insufficient documentation

## 2015-12-04 DIAGNOSIS — S46812D Strain of other muscles, fascia and tendons at shoulder and upper arm level, left arm, subsequent encounter: Secondary | ICD-10-CM | POA: Diagnosis not present

## 2015-12-04 DIAGNOSIS — I70238 Atherosclerosis of native arteries of right leg with ulceration of other part of lower right leg: Secondary | ICD-10-CM | POA: Insufficient documentation

## 2015-12-09 DIAGNOSIS — S46812D Strain of other muscles, fascia and tendons at shoulder and upper arm level, left arm, subsequent encounter: Secondary | ICD-10-CM | POA: Diagnosis not present

## 2015-12-11 DIAGNOSIS — L97812 Non-pressure chronic ulcer of other part of right lower leg with fat layer exposed: Secondary | ICD-10-CM | POA: Diagnosis not present

## 2015-12-11 DIAGNOSIS — E11622 Type 2 diabetes mellitus with other skin ulcer: Secondary | ICD-10-CM | POA: Diagnosis not present

## 2015-12-11 DIAGNOSIS — E1151 Type 2 diabetes mellitus with diabetic peripheral angiopathy without gangrene: Secondary | ICD-10-CM | POA: Diagnosis not present

## 2015-12-11 DIAGNOSIS — I129 Hypertensive chronic kidney disease with stage 1 through stage 4 chronic kidney disease, or unspecified chronic kidney disease: Secondary | ICD-10-CM | POA: Diagnosis not present

## 2015-12-11 DIAGNOSIS — I70238 Atherosclerosis of native arteries of right leg with ulceration of other part of lower right leg: Secondary | ICD-10-CM | POA: Diagnosis not present

## 2015-12-11 DIAGNOSIS — E1122 Type 2 diabetes mellitus with diabetic chronic kidney disease: Secondary | ICD-10-CM | POA: Diagnosis not present

## 2015-12-16 DIAGNOSIS — S46812D Strain of other muscles, fascia and tendons at shoulder and upper arm level, left arm, subsequent encounter: Secondary | ICD-10-CM | POA: Diagnosis not present

## 2015-12-18 DIAGNOSIS — E1151 Type 2 diabetes mellitus with diabetic peripheral angiopathy without gangrene: Secondary | ICD-10-CM | POA: Diagnosis not present

## 2015-12-18 DIAGNOSIS — I129 Hypertensive chronic kidney disease with stage 1 through stage 4 chronic kidney disease, or unspecified chronic kidney disease: Secondary | ICD-10-CM | POA: Diagnosis not present

## 2015-12-18 DIAGNOSIS — E1122 Type 2 diabetes mellitus with diabetic chronic kidney disease: Secondary | ICD-10-CM | POA: Diagnosis not present

## 2015-12-18 DIAGNOSIS — E11622 Type 2 diabetes mellitus with other skin ulcer: Secondary | ICD-10-CM | POA: Diagnosis not present

## 2015-12-18 DIAGNOSIS — I70232 Atherosclerosis of native arteries of right leg with ulceration of calf: Secondary | ICD-10-CM | POA: Diagnosis not present

## 2015-12-18 DIAGNOSIS — I70238 Atherosclerosis of native arteries of right leg with ulceration of other part of lower right leg: Secondary | ICD-10-CM | POA: Diagnosis not present

## 2015-12-18 DIAGNOSIS — L97212 Non-pressure chronic ulcer of right calf with fat layer exposed: Secondary | ICD-10-CM | POA: Diagnosis not present

## 2015-12-18 DIAGNOSIS — L97812 Non-pressure chronic ulcer of other part of right lower leg with fat layer exposed: Secondary | ICD-10-CM | POA: Diagnosis not present

## 2015-12-23 DIAGNOSIS — S46812D Strain of other muscles, fascia and tendons at shoulder and upper arm level, left arm, subsequent encounter: Secondary | ICD-10-CM | POA: Diagnosis not present

## 2015-12-24 DIAGNOSIS — S43432D Superior glenoid labrum lesion of left shoulder, subsequent encounter: Secondary | ICD-10-CM | POA: Diagnosis not present

## 2015-12-24 DIAGNOSIS — Z4789 Encounter for other orthopedic aftercare: Secondary | ICD-10-CM | POA: Diagnosis not present

## 2015-12-25 ENCOUNTER — Other Ambulatory Visit: Payer: Self-pay | Admitting: Podiatry

## 2015-12-25 DIAGNOSIS — E1151 Type 2 diabetes mellitus with diabetic peripheral angiopathy without gangrene: Secondary | ICD-10-CM | POA: Diagnosis not present

## 2015-12-25 DIAGNOSIS — E11622 Type 2 diabetes mellitus with other skin ulcer: Secondary | ICD-10-CM | POA: Diagnosis not present

## 2015-12-25 DIAGNOSIS — L97812 Non-pressure chronic ulcer of other part of right lower leg with fat layer exposed: Secondary | ICD-10-CM | POA: Diagnosis not present

## 2015-12-25 DIAGNOSIS — E1122 Type 2 diabetes mellitus with diabetic chronic kidney disease: Secondary | ICD-10-CM | POA: Diagnosis not present

## 2015-12-25 DIAGNOSIS — E11628 Type 2 diabetes mellitus with other skin complications: Secondary | ICD-10-CM | POA: Diagnosis not present

## 2015-12-25 DIAGNOSIS — I70232 Atherosclerosis of native arteries of right leg with ulceration of calf: Secondary | ICD-10-CM | POA: Diagnosis not present

## 2015-12-25 DIAGNOSIS — I70238 Atherosclerosis of native arteries of right leg with ulceration of other part of lower right leg: Secondary | ICD-10-CM | POA: Diagnosis not present

## 2015-12-25 DIAGNOSIS — I129 Hypertensive chronic kidney disease with stage 1 through stage 4 chronic kidney disease, or unspecified chronic kidney disease: Secondary | ICD-10-CM | POA: Diagnosis not present

## 2015-12-26 ENCOUNTER — Encounter: Payer: Self-pay | Admitting: Podiatry

## 2015-12-26 ENCOUNTER — Ambulatory Visit (INDEPENDENT_AMBULATORY_CARE_PROVIDER_SITE_OTHER): Payer: Medicare Other | Admitting: Podiatry

## 2015-12-26 VITALS — BP 142/84 | HR 79 | Resp 14 | Ht 72.0 in | Wt 209.0 lb

## 2015-12-26 DIAGNOSIS — G5762 Lesion of plantar nerve, left lower limb: Secondary | ICD-10-CM

## 2015-12-26 DIAGNOSIS — I70211 Atherosclerosis of native arteries of extremities with intermittent claudication, right leg: Secondary | ICD-10-CM

## 2015-12-26 DIAGNOSIS — G5782 Other specified mononeuropathies of left lower limb: Secondary | ICD-10-CM

## 2015-12-26 DIAGNOSIS — S46812D Strain of other muscles, fascia and tendons at shoulder and upper arm level, left arm, subsequent encounter: Secondary | ICD-10-CM | POA: Diagnosis not present

## 2015-12-26 MED ORDER — MELOXICAM 15 MG PO TABS: 15.0000 mg | ORAL_TABLET | Freq: Every day | ORAL | 0 refills | Status: DC

## 2015-12-26 NOTE — Progress Notes (Signed)
   Subjective:    Patient ID: Michael [name], male    DOB: 1950/07/11, 65 y.o.   MRN: [id number]  HPI this patient returns to the office stating he's been experiencing left foot pain. He has previously been treated for the [place] with orthotics as well as Mobic.  He says the Mobic prescription has run out and he is doing special exercises due to having left shoulder surgery. He says that his left foot has become aggravated and he presents the office today for an evaluation and treatment of his left foot    Review of Systems  All other systems reviewed and are negative.      Objective:   Physical Exam GENERAL APPEARANCE: Alert, conversant. Appropriately groomed. No acute distress.  VASCULAR: Pedal pulses are  palpable at  [facility] and PT bilateral.  Capillary refill time is immediate to all digits,  Normal temperature gradient.    NEUROLOGIC: sensation is normal to 5.07 monofilament at 5/5 sites bilateral.  Light touch is intact bilateral, Muscle strength normal.  MUSCULOSKELETAL: acceptable muscle strength, tone and stability bilateral.  Intrinsic muscluature intact bilateral.  Rectus appearance of foot and digits noted bilateral. Palpable pain second and third interspaces left foot.  Palpable pain sub 4 left foot.  DERMATOLOGIC: skin color, texture, and turgor are within normal limits.  No preulcerative lesions or ulcers  are seen, no interdigital maceration noted.  No open lesions present.  Digital nails are asymptomatic. No drainage noted.         Assessment & Plan:  Neuroma left foot   Capsulitis left foot.   ROV  Examination reveals pain noted around the fourth MPJ of the left foot. My diagnosis included both neuroma and capsulitis. He says that when he takes the [place] there is no pain or discomfort patient returns to the office requesting lobectomy prescribed for his left foot pain. I told him with a diagnosis of neuroma. I usually provide a cortisone injection. In the foot. He  says that that his cortisone injection in his shoulder shot up his sugar since he is diabetic. Therefore, he requests a prescription for Mobic and if the problem persists. He will then consider returning for injection therapy for the neuromas return to clinic when necessary   Gardiner Barefoot DPM

## 2015-12-30 DIAGNOSIS — S46812D Strain of other muscles, fascia and tendons at shoulder and upper arm level, left arm, subsequent encounter: Secondary | ICD-10-CM | POA: Diagnosis not present

## 2016-01-01 ENCOUNTER — Encounter (HOSPITAL_BASED_OUTPATIENT_CLINIC_OR_DEPARTMENT_OTHER): Payer: Medicare Other | Attending: Surgery

## 2016-01-01 DIAGNOSIS — I70238 Atherosclerosis of native arteries of right leg with ulceration of other part of lower right leg: Secondary | ICD-10-CM | POA: Insufficient documentation

## 2016-01-01 DIAGNOSIS — E1151 Type 2 diabetes mellitus with diabetic peripheral angiopathy without gangrene: Secondary | ICD-10-CM | POA: Insufficient documentation

## 2016-01-01 DIAGNOSIS — I129 Hypertensive chronic kidney disease with stage 1 through stage 4 chronic kidney disease, or unspecified chronic kidney disease: Secondary | ICD-10-CM | POA: Diagnosis not present

## 2016-01-01 DIAGNOSIS — L97812 Non-pressure chronic ulcer of other part of right lower leg with fat layer exposed: Secondary | ICD-10-CM | POA: Diagnosis not present

## 2016-01-01 DIAGNOSIS — E669 Obesity, unspecified: Secondary | ICD-10-CM | POA: Insufficient documentation

## 2016-01-01 DIAGNOSIS — L97212 Non-pressure chronic ulcer of right calf with fat layer exposed: Secondary | ICD-10-CM | POA: Diagnosis not present

## 2016-01-01 DIAGNOSIS — E1122 Type 2 diabetes mellitus with diabetic chronic kidney disease: Secondary | ICD-10-CM | POA: Diagnosis not present

## 2016-01-01 DIAGNOSIS — N189 Chronic kidney disease, unspecified: Secondary | ICD-10-CM | POA: Diagnosis not present

## 2016-01-01 DIAGNOSIS — E11622 Type 2 diabetes mellitus with other skin ulcer: Secondary | ICD-10-CM | POA: Diagnosis not present

## 2016-01-01 DIAGNOSIS — Z6828 Body mass index (BMI) 28.0-28.9, adult: Secondary | ICD-10-CM | POA: Insufficient documentation

## 2016-01-01 DIAGNOSIS — I70232 Atherosclerosis of native arteries of right leg with ulceration of calf: Secondary | ICD-10-CM | POA: Diagnosis not present

## 2016-01-03 DIAGNOSIS — S46812D Strain of other muscles, fascia and tendons at shoulder and upper arm level, left arm, subsequent encounter: Secondary | ICD-10-CM | POA: Diagnosis not present

## 2016-01-06 DIAGNOSIS — S46812D Strain of other muscles, fascia and tendons at shoulder and upper arm level, left arm, subsequent encounter: Secondary | ICD-10-CM | POA: Diagnosis not present

## 2016-01-08 DIAGNOSIS — I70238 Atherosclerosis of native arteries of right leg with ulceration of other part of lower right leg: Secondary | ICD-10-CM | POA: Diagnosis not present

## 2016-01-08 DIAGNOSIS — E11622 Type 2 diabetes mellitus with other skin ulcer: Secondary | ICD-10-CM | POA: Diagnosis not present

## 2016-01-08 DIAGNOSIS — L97212 Non-pressure chronic ulcer of right calf with fat layer exposed: Secondary | ICD-10-CM | POA: Diagnosis not present

## 2016-01-08 DIAGNOSIS — L97812 Non-pressure chronic ulcer of other part of right lower leg with fat layer exposed: Secondary | ICD-10-CM | POA: Diagnosis not present

## 2016-01-08 DIAGNOSIS — E669 Obesity, unspecified: Secondary | ICD-10-CM | POA: Diagnosis not present

## 2016-01-08 DIAGNOSIS — E1151 Type 2 diabetes mellitus with diabetic peripheral angiopathy without gangrene: Secondary | ICD-10-CM | POA: Diagnosis not present

## 2016-01-09 DIAGNOSIS — S46812D Strain of other muscles, fascia and tendons at shoulder and upper arm level, left arm, subsequent encounter: Secondary | ICD-10-CM | POA: Diagnosis not present

## 2016-01-13 DIAGNOSIS — S46812D Strain of other muscles, fascia and tendons at shoulder and upper arm level, left arm, subsequent encounter: Secondary | ICD-10-CM | POA: Diagnosis not present

## 2016-01-15 ENCOUNTER — Encounter: Payer: Self-pay | Admitting: Family

## 2016-01-15 DIAGNOSIS — L97812 Non-pressure chronic ulcer of other part of right lower leg with fat layer exposed: Secondary | ICD-10-CM | POA: Diagnosis not present

## 2016-01-15 DIAGNOSIS — E669 Obesity, unspecified: Secondary | ICD-10-CM | POA: Diagnosis not present

## 2016-01-15 DIAGNOSIS — S46812D Strain of other muscles, fascia and tendons at shoulder and upper arm level, left arm, subsequent encounter: Secondary | ICD-10-CM | POA: Diagnosis not present

## 2016-01-15 DIAGNOSIS — E1151 Type 2 diabetes mellitus with diabetic peripheral angiopathy without gangrene: Secondary | ICD-10-CM | POA: Diagnosis not present

## 2016-01-15 DIAGNOSIS — E11622 Type 2 diabetes mellitus with other skin ulcer: Secondary | ICD-10-CM | POA: Diagnosis not present

## 2016-01-15 DIAGNOSIS — I70232 Atherosclerosis of native arteries of right leg with ulceration of calf: Secondary | ICD-10-CM | POA: Diagnosis not present

## 2016-01-15 DIAGNOSIS — I70238 Atherosclerosis of native arteries of right leg with ulceration of other part of lower right leg: Secondary | ICD-10-CM | POA: Diagnosis not present

## 2016-01-15 DIAGNOSIS — L97212 Non-pressure chronic ulcer of right calf with fat layer exposed: Secondary | ICD-10-CM | POA: Diagnosis not present

## 2016-01-20 DIAGNOSIS — S46812D Strain of other muscles, fascia and tendons at shoulder and upper arm level, left arm, subsequent encounter: Secondary | ICD-10-CM | POA: Diagnosis not present

## 2016-01-22 DIAGNOSIS — I70238 Atherosclerosis of native arteries of right leg with ulceration of other part of lower right leg: Secondary | ICD-10-CM | POA: Diagnosis not present

## 2016-01-22 DIAGNOSIS — E11622 Type 2 diabetes mellitus with other skin ulcer: Secondary | ICD-10-CM | POA: Diagnosis not present

## 2016-01-22 DIAGNOSIS — E1151 Type 2 diabetes mellitus with diabetic peripheral angiopathy without gangrene: Secondary | ICD-10-CM | POA: Diagnosis not present

## 2016-01-22 DIAGNOSIS — S46812D Strain of other muscles, fascia and tendons at shoulder and upper arm level, left arm, subsequent encounter: Secondary | ICD-10-CM | POA: Diagnosis not present

## 2016-01-22 DIAGNOSIS — E669 Obesity, unspecified: Secondary | ICD-10-CM | POA: Diagnosis not present

## 2016-01-22 DIAGNOSIS — I70232 Atherosclerosis of native arteries of right leg with ulceration of calf: Secondary | ICD-10-CM | POA: Diagnosis not present

## 2016-01-22 DIAGNOSIS — L97212 Non-pressure chronic ulcer of right calf with fat layer exposed: Secondary | ICD-10-CM | POA: Diagnosis not present

## 2016-01-22 DIAGNOSIS — L97812 Non-pressure chronic ulcer of other part of right lower leg with fat layer exposed: Secondary | ICD-10-CM | POA: Diagnosis not present

## 2016-01-27 ENCOUNTER — Ambulatory Visit (INDEPENDENT_AMBULATORY_CARE_PROVIDER_SITE_OTHER): Payer: Medicare Other | Admitting: Family

## 2016-01-27 ENCOUNTER — Encounter: Payer: Self-pay | Admitting: Family

## 2016-01-27 ENCOUNTER — Ambulatory Visit (HOSPITAL_COMMUNITY)
Admission: RE | Admit: 2016-01-27 | Discharge: 2016-01-27 | Disposition: A | Discharge status: Home / Self Care | Payer: Medicare Other | Source: Ambulatory Visit | Attending: Family | Admitting: Family

## 2016-01-27 ENCOUNTER — Other Ambulatory Visit: Payer: Self-pay | Admitting: *Deleted

## 2016-01-27 ENCOUNTER — Ambulatory Visit (INDEPENDENT_AMBULATORY_CARE_PROVIDER_SITE_OTHER)
Admission: RE | Admit: 2016-01-27 | Discharge: 2016-01-27 | Disposition: A | Discharge status: Home / Self Care | Payer: Medicare Other | Source: Ambulatory Visit | Attending: Family | Admitting: Family

## 2016-01-27 VITALS — BP 153/90 | HR 77 | Temp 97.7°F | Ht 71.0 in | Wt 234.0 lb

## 2016-01-27 DIAGNOSIS — I70211 Atherosclerosis of native arteries of extremities with intermittent claudication, right leg: Secondary | ICD-10-CM

## 2016-01-27 DIAGNOSIS — Z959 Presence of cardiac and vascular implant and graft, unspecified: Secondary | ICD-10-CM

## 2016-01-27 DIAGNOSIS — Z87891 Personal history of nicotine dependence: Secondary | ICD-10-CM | POA: Insufficient documentation

## 2016-01-27 DIAGNOSIS — S46812D Strain of other muscles, fascia and tendons at shoulder and upper arm level, left arm, subsequent encounter: Secondary | ICD-10-CM | POA: Diagnosis not present

## 2016-01-27 NOTE — Patient Instructions (Signed)

## 2016-01-27 NOTE — Progress Notes (Signed)
[facility] [place]   CC: Follow up peripheral artery occlusive disease  History of Present Illness Michael [name] is a 65 y.o. male patient of Dr. Trula Slade who was initially seen by Dr. Kellie Simmering for right leg claudication. Dr. Trula Slade subsequently placed a right superficial femoral artery stent in September 2012. The patient has done very well since that time.Velocity elevations of 280 within the stent have been monitored. He remains asymptomatic.  He has been treated by Dr. Con Memos, wound care center, several times for non healing wound lateral aspect right knee, which pt states eventually needed a porcine graft and is healing since then. He is trying to get back to his walking, was walking 3-4 miles/day.   He had left shoulder surgery after an injury in September 2017.    Pt Diabetic: Yes, 7.9 A1C in August 2017, by pt report Pt smoker: non-smoker, quit smokeless tobacco use in 1983  Pt meds include: Statin :Yes Betablocker: No ASA: No Other anticoagulants/antiplatelets: Plavix     Past Medical History:  Diagnosis Date  . Allergy   . Anxiety   . Diabetes mellitus   . Hyperlipidemia   . Hypertension     Social History Social History  Substance Use Topics  . Smoking status: Never Smoker  . Smokeless tobacco: Former Systems developer    Quit date: 10/26/1981  . Alcohol use 0.0 oz/week     Comment: occasional drink    Family History Family History  Problem Relation Age of Onset  . Heart disease Father   . Deep vein thrombosis Father   . Hyperlipidemia Father   . Hypertension Father   . Heart attack Father   . Peripheral vascular disease Father   . Hyperlipidemia Mother   . Hypertension Mother   . Cancer Sister     Past Surgical History:  Procedure Laterality Date  . ANGIOPLASTY / STENTING FEMORAL  11/05/10   Left SFA stent  . KNEE ARTHROSCOPY    . ROTATOR CUFF REPAIR     1990    Allergies  Allergen Reactions  . Actos [Pioglitazone]    . Byetta 10 Mcg Pen [Exenatide]   . Demerol   . Glimepiride     Edema   . Nabumetone   . Oxycodone   . Penicillins     Current Outpatient Prescriptions  Medication Sig Dispense Refill  . amLODipine (NORVASC) 10 MG tablet Take 10 mg by mouth daily.      Marland Kitchen atorvastatin (LIPITOR) 40 MG tablet Take 40 mg by mouth at bedtime.  5  . benazepril (LOTENSIN) 40 MG tablet Take 40 mg by mouth daily.      . clonazePAM (KLONOPIN) 0.5 MG tablet TAKE 1/2-1 TABLET DAILY AS NEEDED FOR SLEEP  5  . clopidogrel (PLAVIX) 75 MG tablet Take 1 tablet (75 mg total) by mouth daily with breakfast. 90 tablet 2  . ibuprofen (ADVIL,MOTRIN) 800 MG tablet Take 800 mg by mouth as needed for moderate pain.   0  . insulin glargine (LANTUS) 100 UNIT/ML injection Inject 20 Units into the skin at bedtime.     Marland Kitchen ketorolac (TORADOL) 10 MG tablet     . meloxicam (MOBIC) 15 MG tablet Take 1 tablet (15 mg total) by mouth daily. 30 tablet 0  . metFORMIN (GLUCOPHAGE-XR) 500 MG 24 hr tablet     . methocarbamol (ROBAXIN) 500 MG tablet     . metoprolol succinate (TOPROL-XL) 100 MG 24 hr tablet     . rosuvastatin (  CRESTOR) 10 MG tablet Take 10 mg by mouth daily.      Marland Kitchen ACCU-CHEK AVIVA PLUS test strip AS DIRECTED SEVEN TIMES A DAY IN VITRO 90 DAYS  4  . Canagliflozin (INVOKANA) 300 MG TABS Take by mouth.    . INVOKAMET XR [phone] MG TB24     . mupirocin ointment (BACTROBAN) 2 %     . NOVOLOG FLEXPEN 100 UNIT/ML FlexPen USE 11 UNITS IF BLOOD SUGAR 88 TO 126, THEN 1 UNIT FOR EVERY 40MG /DL ABOVE GOAL.  2  . ondansetron (ZOFRAN) 4 MG tablet     . oxyCODONE-acetaminophen (PERCOCET/ROXICET) 5-325 MG tablet     . tamsulosin (FLOMAX) 0.4 MG CAPS capsule TAKE 1 CAPSULE BY MOUTH EVERY DAY AFTER DINNER  5  . temazepam (RESTORIL) 30 MG capsule     . VOLTAREN 1 % GEL Apply 1 application topically daily.     No current facility-administered medications for this visit.     REVIEW OF SYSTEMS: Cardiovascular: No chest pain, chest pressure,  palpitations, orthopnea, or dyspnea on exertion. No claudication or rest pain, No history of DVT or phlebitis. Pulmonary: No productive cough, asthma or wheezing. Neurologic: No weakness, paresthesias, aphasia, or amaurosis. No dizziness. Hematologic: No bleeding problems or clotting disorders. Musculoskeletal: Bilateral knee pain, left greater than right Gastrointestinal: No blood in stool or hematemesis Genitourinary: No dysuria or hematuria. Psychiatric:: No history of major depression. Integumentary: Plaque psoriasis on his back, under treatment. Shallow dry lesions lateral aspect right knee, see HPI, erythema has resolved. Constitutional: No fever or chills.     Physical Examination  Vitals:   01/27/16 1453 01/27/16 1457  BP: (!) 150/87 (!) 153/90  Pulse: 77   Temp: 97.7 F (36.5 C)   Weight: 234 lb (106.1 kg)   Height: 5\' 11"  (1.803 m)    Body mass index is 32.64 kg/m.  General: The patient appears their stated age, obese male. HEENT: No gross abnormalities Pulmonary: Non labored breathing Musculoskeletal: There are no major deformities. Neurologic: No focal weakness or paresthesias are detected Skin: Shallow dry lesion lateral aspect right knee, see HPI, erythema has resolved. Large areas of plaque psoriasis on lower back. Psychiatric: The patient has normal affect. Talkative. Cardiovascular: There is a regular rate and rhythm without significant murmur appreciated. No carotid bruits. Palpable left pedal pulses at 2+. Right pedal pulses: 1+ DP, 2+ PT. Right femoral pulse is not palpable. Left femoral pulse is palpable.    ASSESSMENT: Michael [name] is a 65 y.o. male who is s/p right superficial femoral artery stent in September 2012 for right leg claudication.   His claudication has resolved since the above procedure. Right SFA stent has been monitored for stenosis in the upper 200's velocity cm/s.   Pt's DM is almost in control with his last reported A1C of  7.9. Pt has never smoked and he stopped smokeless tobacco use in 1983. He takes a statin and Plavix.  Pt has been seeing a dermatologist for psoriasis.  DATA Today's right LE arterial duplex: diffuse calcific plaque throughout the right SFA. Patent right SFA stent with elevated velocity at the entry site (215 cm/s) and distal stent (325 cm/s), c/w >50-99% stenosis. No significant change compared to the last exam on 06-24-15. Bilateral ABI's remain stable with all triphasic waveforms: right is 0.98 with TBI of 0.80 (normal); Left is falsely elevated, therefore unreliable, non compressible, TBI is also normal at 1.10.    PLAN:  Graduated walking program when his ortho, PCP,  and cardiologist agree.  Based on the patient's vascular studies and examination, pt will return to clinic in 6 months with ABI's and right LE arterial duplex, see Dr. Trula Slade afterward.  I advised him to return sooner if needed.   I discussed in depth with the patient the nature of atherosclerosis, and emphasized the importance of maximal medical management including strict control of blood pressure, blood glucose, and lipid levels, obtaining regular exercise, and continued cessation of tobacco use.  The patient is aware that without maximal medical management the underlying atherosclerotic disease process will progress, limiting the benefit of any interventions.  The patient was given information about PAD including signs, symptoms, treatment, what symptoms should prompt the patient to seek immediate medical care, and risk reduction measures to take.  Clemon Chambers, RN, MSN, FNP-C [facility] Phone: [phone]  Clinic MD: Trula Slade  01/27/16 3:22 PM

## 2016-01-28 NOTE — Addendum Note (Signed)
Addended by: Lianne Cure A on: 01/28/2016 08:26 AM   Modules accepted: Orders

## 2016-01-29 DIAGNOSIS — E11622 Type 2 diabetes mellitus with other skin ulcer: Secondary | ICD-10-CM | POA: Diagnosis not present

## 2016-01-29 DIAGNOSIS — E1151 Type 2 diabetes mellitus with diabetic peripheral angiopathy without gangrene: Secondary | ICD-10-CM | POA: Diagnosis not present

## 2016-01-29 DIAGNOSIS — I70238 Atherosclerosis of native arteries of right leg with ulceration of other part of lower right leg: Secondary | ICD-10-CM | POA: Diagnosis not present

## 2016-01-29 DIAGNOSIS — I70232 Atherosclerosis of native arteries of right leg with ulceration of calf: Secondary | ICD-10-CM | POA: Diagnosis not present

## 2016-01-29 DIAGNOSIS — L97811 Non-pressure chronic ulcer of other part of right lower leg limited to breakdown of skin: Secondary | ICD-10-CM | POA: Diagnosis not present

## 2016-01-29 DIAGNOSIS — L97212 Non-pressure chronic ulcer of right calf with fat layer exposed: Secondary | ICD-10-CM | POA: Diagnosis not present

## 2016-01-29 DIAGNOSIS — L97812 Non-pressure chronic ulcer of other part of right lower leg with fat layer exposed: Secondary | ICD-10-CM | POA: Diagnosis not present

## 2016-01-29 DIAGNOSIS — S46812D Strain of other muscles, fascia and tendons at shoulder and upper arm level, left arm, subsequent encounter: Secondary | ICD-10-CM | POA: Diagnosis not present

## 2016-01-29 DIAGNOSIS — E669 Obesity, unspecified: Secondary | ICD-10-CM | POA: Diagnosis not present

## 2016-01-30 DIAGNOSIS — S43432D Superior glenoid labrum lesion of left shoulder, subsequent encounter: Secondary | ICD-10-CM | POA: Diagnosis not present

## 2016-01-30 DIAGNOSIS — Z4789 Encounter for other orthopedic aftercare: Secondary | ICD-10-CM | POA: Diagnosis not present

## 2016-02-03 DIAGNOSIS — S46812D Strain of other muscles, fascia and tendons at shoulder and upper arm level, left arm, subsequent encounter: Secondary | ICD-10-CM | POA: Diagnosis not present

## 2016-02-05 DIAGNOSIS — S46812D Strain of other muscles, fascia and tendons at shoulder and upper arm level, left arm, subsequent encounter: Secondary | ICD-10-CM | POA: Diagnosis not present

## 2016-02-06 ENCOUNTER — Other Ambulatory Visit: Payer: Self-pay | Admitting: Interventional Cardiology

## 2016-02-07 ENCOUNTER — Other Ambulatory Visit: Payer: Self-pay | Admitting: Podiatry

## 2016-02-10 DIAGNOSIS — S46812D Strain of other muscles, fascia and tendons at shoulder and upper arm level, left arm, subsequent encounter: Secondary | ICD-10-CM | POA: Diagnosis not present

## 2016-02-11 NOTE — Telephone Encounter (Signed)
Pt needs an appt prior to future refills. 

## 2016-02-12 DIAGNOSIS — S46812D Strain of other muscles, fascia and tendons at shoulder and upper arm level, left arm, subsequent encounter: Secondary | ICD-10-CM | POA: Diagnosis not present

## 2016-02-13 ENCOUNTER — Other Ambulatory Visit: Payer: Self-pay | Admitting: Podiatry

## 2016-02-13 DIAGNOSIS — Z961 Presence of intraocular lens: Secondary | ICD-10-CM | POA: Diagnosis not present

## 2016-02-13 DIAGNOSIS — H40013 Open angle with borderline findings, low risk, bilateral: Secondary | ICD-10-CM | POA: Diagnosis not present

## 2016-02-13 DIAGNOSIS — E119 Type 2 diabetes mellitus without complications: Secondary | ICD-10-CM | POA: Diagnosis not present

## 2016-02-17 DIAGNOSIS — S46812D Strain of other muscles, fascia and tendons at shoulder and upper arm level, left arm, subsequent encounter: Secondary | ICD-10-CM | POA: Diagnosis not present

## 2016-02-26 DIAGNOSIS — S46812D Strain of other muscles, fascia and tendons at shoulder and upper arm level, left arm, subsequent encounter: Secondary | ICD-10-CM | POA: Diagnosis not present

## 2016-02-28 DIAGNOSIS — S46812D Strain of other muscles, fascia and tendons at shoulder and upper arm level, left arm, subsequent encounter: Secondary | ICD-10-CM | POA: Diagnosis not present

## 2016-03-03 DIAGNOSIS — S46812D Strain of other muscles, fascia and tendons at shoulder and upper arm level, left arm, subsequent encounter: Secondary | ICD-10-CM | POA: Diagnosis not present

## 2016-03-05 DIAGNOSIS — S46812D Strain of other muscles, fascia and tendons at shoulder and upper arm level, left arm, subsequent encounter: Secondary | ICD-10-CM | POA: Diagnosis not present

## 2016-03-07 ENCOUNTER — Other Ambulatory Visit: Payer: Self-pay | Admitting: Interventional Cardiology

## 2016-03-10 DIAGNOSIS — S46812D Strain of other muscles, fascia and tendons at shoulder and upper arm level, left arm, subsequent encounter: Secondary | ICD-10-CM | POA: Diagnosis not present

## 2016-03-12 DIAGNOSIS — S43432D Superior glenoid labrum lesion of left shoulder, subsequent encounter: Secondary | ICD-10-CM | POA: Diagnosis not present

## 2016-03-12 DIAGNOSIS — Z4789 Encounter for other orthopedic aftercare: Secondary | ICD-10-CM | POA: Diagnosis not present

## 2016-03-16 DIAGNOSIS — S46812D Strain of other muscles, fascia and tendons at shoulder and upper arm level, left arm, subsequent encounter: Secondary | ICD-10-CM | POA: Diagnosis not present

## 2016-03-22 ENCOUNTER — Other Ambulatory Visit: Payer: Self-pay | Admitting: Interventional Cardiology

## 2016-03-23 ENCOUNTER — Other Ambulatory Visit: Payer: Self-pay | Admitting: *Deleted

## 2016-03-25 DIAGNOSIS — S46812D Strain of other muscles, fascia and tendons at shoulder and upper arm level, left arm, subsequent encounter: Secondary | ICD-10-CM | POA: Diagnosis not present

## 2016-03-26 DIAGNOSIS — I10 Essential (primary) hypertension: Secondary | ICD-10-CM | POA: Diagnosis not present

## 2016-03-26 DIAGNOSIS — G479 Sleep disorder, unspecified: Secondary | ICD-10-CM | POA: Diagnosis not present

## 2016-03-26 DIAGNOSIS — N401 Enlarged prostate with lower urinary tract symptoms: Secondary | ICD-10-CM | POA: Diagnosis not present

## 2016-03-30 DIAGNOSIS — S46812D Strain of other muscles, fascia and tendons at shoulder and upper arm level, left arm, subsequent encounter: Secondary | ICD-10-CM | POA: Diagnosis not present

## 2016-04-01 DIAGNOSIS — S46812D Strain of other muscles, fascia and tendons at shoulder and upper arm level, left arm, subsequent encounter: Secondary | ICD-10-CM | POA: Diagnosis not present

## 2016-04-04 ENCOUNTER — Other Ambulatory Visit: Payer: Self-pay | Admitting: Interventional Cardiology

## 2016-04-06 DIAGNOSIS — E1151 Type 2 diabetes mellitus with diabetic peripheral angiopathy without gangrene: Secondary | ICD-10-CM | POA: Diagnosis not present

## 2016-04-06 DIAGNOSIS — Z79899 Other long term (current) drug therapy: Secondary | ICD-10-CM | POA: Diagnosis not present

## 2016-04-06 DIAGNOSIS — Z794 Long term (current) use of insulin: Secondary | ICD-10-CM | POA: Diagnosis not present

## 2016-04-06 DIAGNOSIS — E230 Hypopituitarism: Secondary | ICD-10-CM | POA: Diagnosis not present

## 2016-04-06 DIAGNOSIS — S46812D Strain of other muscles, fascia and tendons at shoulder and upper arm level, left arm, subsequent encounter: Secondary | ICD-10-CM | POA: Diagnosis not present

## 2016-04-07 NOTE — Telephone Encounter (Signed)
Please advise on refill request as patient has not been seen since 01/29/15 and has had multiple refills with a note to call and schedule an appointment. His last two refills were only for a quantity of fifteen. Thanks, MI

## 2016-04-08 ENCOUNTER — Telehealth: Payer: Self-pay

## 2016-04-08 DIAGNOSIS — S46812D Strain of other muscles, fascia and tendons at shoulder and upper arm level, left arm, subsequent encounter: Secondary | ICD-10-CM | POA: Diagnosis not present

## 2016-04-08 NOTE — Telephone Encounter (Signed)
Left a message for the pt to call me back on his VM.

## 2016-04-08 NOTE — Telephone Encounter (Signed)
**Note De-Identified  Obfuscation** The pt has scheduled an appt with Dr Irish Lack on 4/3. Refill sent in for #30 with 1 refill to last until f/u. The pt is aware.

## 2016-04-12 ENCOUNTER — Other Ambulatory Visit: Payer: Self-pay | Admitting: Podiatry

## 2016-04-15 DIAGNOSIS — S46812D Strain of other muscles, fascia and tendons at shoulder and upper arm level, left arm, subsequent encounter: Secondary | ICD-10-CM | POA: Diagnosis not present

## 2016-04-20 DIAGNOSIS — J309 Allergic rhinitis, unspecified: Secondary | ICD-10-CM | POA: Diagnosis not present

## 2016-04-20 DIAGNOSIS — R51 Headache: Secondary | ICD-10-CM | POA: Diagnosis not present

## 2016-04-20 DIAGNOSIS — J329 Chronic sinusitis, unspecified: Secondary | ICD-10-CM | POA: Diagnosis not present

## 2016-04-22 DIAGNOSIS — S46812D Strain of other muscles, fascia and tendons at shoulder and upper arm level, left arm, subsequent encounter: Secondary | ICD-10-CM | POA: Diagnosis not present

## 2016-04-23 DIAGNOSIS — Z4789 Encounter for other orthopedic aftercare: Secondary | ICD-10-CM | POA: Diagnosis not present

## 2016-04-23 DIAGNOSIS — M25512 Pain in left shoulder: Secondary | ICD-10-CM | POA: Diagnosis not present

## 2016-05-09 ENCOUNTER — Other Ambulatory Visit: Payer: Self-pay | Admitting: Podiatry

## 2016-05-11 NOTE — Telephone Encounter (Signed)
Pt calling needs refill on meloxicam.He is out as of yesterday. [facility] uses [facility] [place]

## 2016-05-11 NOTE — Telephone Encounter (Signed)
Pt needs an appt prior to future refills. 

## 2016-05-13 ENCOUNTER — Ambulatory Visit (INDEPENDENT_AMBULATORY_CARE_PROVIDER_SITE_OTHER): Payer: Medicare Other | Admitting: Podiatry

## 2016-05-13 ENCOUNTER — Encounter: Payer: Self-pay | Admitting: Podiatry

## 2016-05-13 DIAGNOSIS — G5782 Other specified mononeuropathies of left lower limb: Secondary | ICD-10-CM

## 2016-05-13 DIAGNOSIS — M7751 Other enthesopathy of right foot: Secondary | ICD-10-CM | POA: Diagnosis not present

## 2016-05-13 DIAGNOSIS — M71571 Other bursitis, not elsewhere classified, right ankle and foot: Secondary | ICD-10-CM

## 2016-05-13 DIAGNOSIS — G5762 Lesion of plantar nerve, left lower limb: Secondary | ICD-10-CM

## 2016-05-13 NOTE — Progress Notes (Signed)
   Subjective:    Patient ID: Michael [name], male    DOB: 1951-01-02, 66 y.o.   MRN: [id number]  HPI this patient returns to the office stating he's been experiencing right foot pain. He has previously been treated for the [place] with orthotics as well as Mobic.   He says that his right  foot has become aggravated and he presents the office today for an evaluation and treatment of his right foot    Review of Systems  All other systems reviewed and are negative.      Objective:   Physical Exam GENERAL APPEARANCE: Alert, conversant. Appropriately groomed. No acute distress.  VASCULAR: Pedal pulses are  palpable at  [facility] and PT left foot.  Right foot PT and DP are barely palpable. Capillary refill time is immediate to all digits,  Normal temperature gradient.    NEUROLOGIC: sensation is diminished  to 5.07 monofilament at 5/5 sites bilateral.  Light touch is intact bilateral, Muscle strength normal.  MUSCULOSKELETAL: acceptable muscle strength, tone and stability bilateral.  Intrinsic muscluature intact bilateral.  Rectus appearance of foot and digits noted bilateral. Palpable pain second and third interspaces rightfoot.  Palpable pain sub 4 right foot.  DERMATOLOGIC: skin color, texture, and turgor are within normal limits.  No preulcerative lesions or ulcers  are seen, no interdigital maceration noted.  No open lesions present.   No drainage noted.  NAILS  Thick disfigured discolored nails hallux  B/L  Fourth toenail right foot is blackened and asymptomatic.         Assessment & Plan:  Neuroma right foot   Capsulitisright  foot.   ROV  Examination reveals pain noted around the fourth MPJ of the left foot. My diagnosis included both neuroma and capsulitis. Patient says the Mobic relieves his pain.  Prescription was approved.  He has hallux nails both feet debrided.  RTC prn   Gardiner Barefoot DPM

## 2016-05-30 ENCOUNTER — Other Ambulatory Visit: Payer: Self-pay | Admitting: Interventional Cardiology

## 2016-06-01 NOTE — Progress Notes (Signed)
Patient ID: Michael [name], male   DOB: 1950-04-10, 66 y.o.   MRN: [id number]     Cardiology Office Note   Date:  06/02/2016   ID:  Michael [name], DOB June 24, 1950, MRN [id number]  PCP:  Orpah Melter, MD    No chief complaint on file. PAD, increased right leg numbness   Wt Readings from Last 3 Encounters:  06/02/16 238 lb (108 kg)  01/27/16 234 lb (106.1 kg)  12/26/15 209 lb (94.8 kg)       History of Present Illness: Michael [name] is a 66 y.o. male  Who has had PAD.  He had an SFA stent to the right leg in 2012.  His right leg claudication is improved after the stent.  He has been walking, without claudication for the first 10 minutes of exercise. He reports some leg numbness when he exercises.  After 10 minutes, he feels some tightness in his calf.  He had not been exercising due to a shoulder injury.  He is getting back into regular exercise.  He walks regular and exercises with a Physiological scientist. He has another u/s for his leg in a few months.   He has gained some weight since his last visit.  He had decreased to 195 lbs with a strict diet, but gained the weight back.  His exercise has dropped off as noted above.    He has been concerned about low T. He discussed this with his PMD.  He has taken injectable testosterone, but stopped it due to increased risk of clotting.  He had no benefit from Androgel.    He had some increased BP.  THis has improved with increased diuretics.  His A1C increased back to 10.1 when he gained the weight back.   Past Medical History:  Diagnosis Date  . Allergy   . Anxiety   . Diabetes mellitus   . Hyperlipidemia   . Hypertension     Past Surgical History:  Procedure Laterality Date  . ANGIOPLASTY / STENTING FEMORAL  11/05/10   Left SFA stent  . KNEE ARTHROSCOPY    . ROTATOR CUFF REPAIR     1990     Current Outpatient Prescriptions  Medication Sig Dispense Refill  . ACCU-CHEK AVIVA PLUS test strip AS DIRECTED SEVEN TIMES A  DAY IN VITRO 90 DAYS  4  . amLODipine (NORVASC) 10 MG tablet Take 10 mg by mouth daily.      Marland Kitchen atorvastatin (LIPITOR) 40 MG tablet Take 40 mg by mouth at bedtime.  5  . benazepril (LOTENSIN) 40 MG tablet Take 40 mg by mouth daily.      . clonazePAM (KLONOPIN) 0.5 MG tablet TAKE 1/2-1 TABLET DAILY AS NEEDED FOR SLEEP  5  . clopidogrel (PLAVIX) 75 MG tablet TAKE 1 TABLET BY MOUTH EVERY MORNING WITH BREAKFAST 30 tablet 1  . ibuprofen (ADVIL,MOTRIN) 800 MG tablet Take 800 mg by mouth as needed for moderate pain.   0  . insulin glargine (LANTUS) 100 UNIT/ML injection Inject 20 Units into the skin at bedtime.     . INVOKAMET XR [phone] MG TB24     . ketorolac (TORADOL) 10 MG tablet     . meloxicam (MOBIC) 15 MG tablet TAKE 1 TABLET (15 MG TOTAL) BY MOUTH DAILY. 30 tablet 0  . metFORMIN (GLUCOPHAGE-XR) 500 MG 24 hr tablet     . methocarbamol (ROBAXIN) 500 MG tablet     . metoprolol succinate (TOPROL-XL) 100 MG 24 hr tablet     .  mupirocin ointment (BACTROBAN) 2 %     . NOVOLOG FLEXPEN 100 UNIT/ML FlexPen USE 11 UNITS IF BLOOD SUGAR 88 TO 126, THEN 1 UNIT FOR EVERY 40MG /DL ABOVE GOAL.  2  . ondansetron (ZOFRAN) 4 MG tablet     . tamsulosin (FLOMAX) 0.4 MG CAPS capsule TAKE 1 CAPSULE BY MOUTH EVERY DAY AFTER DINNER  5  . temazepam (RESTORIL) 30 MG capsule      No current facility-administered medications for this visit.     Allergies:   Actos [pioglitazone]; Byetta 10 mcg pen [exenatide]; Demerol; Glimepiride; Nabumetone; Oxycodone; and Penicillins    Social History:  The patient  reports that he has never smoked. He quit smokeless tobacco use about 34 years ago. He reports that he drinks alcohol. He reports that he does not use drugs.   Family History:  The patient's family history includes Cancer in his sister; Deep vein thrombosis in his father; Heart attack in his father; Heart disease in his father; Hyperlipidemia in his father and mother; Hypertension in his father and mother; Peripheral  vascular disease in his father.    ROS:  Please see the history of present illness.   Otherwise, review of systems are positive for weight gain, right leg pain.   All other systems are reviewed and negative.    PHYSICAL EXAM: VS:  BP 136/82   Pulse 89   Ht 5\' 11"  (1.803 m)   Wt 238 lb (108 kg)   SpO2 94%   BMI 33.19 kg/m  , BMI Body mass index is 33.19 kg/m. GEN: Well nourished, well developed, in no acute distress  HEENT: normal  Neck: no JVD, carotid bruits, or masses Cardiac: RRR; no murmurs, rubs, or gallops,no edema ; 2+ right PT pulse, 2+ left DP pulse Respiratory:  clear to auscultation bilaterally, normal work of breathing GI: soft, nontender, nondistended, + BS MS: no deformity or atrophy  Skin: warm and dry, no rash Neuro:  Strength and sensation are intact Psych: euthymic mood, full affect    Recent Labs: No results found for requested labs within last 8760 hours.   Lipid Panel No results found for: CHOL, TRIG, HDL, CHOLHDL, VLDL, LDLCALC, LDLDIRECT   Other studies Reviewed: Additional studies/ records that were reviewed today with results demonstrating: Negative stress test in 2012-06-21.   ASSESSMENT AND PLAN:  1. PAD: SFA stent followed by [facility].  Now with u/s every 6 months.  Mildly increased velocities in the stent on prior check. Unclear cause of numbness in the leg.  Palpable pulse.  Continue regular exercise.  2. Hypertension: Controlled better with diuretic.  Normal Cardiolite in June 21, 2012.  No cardiac symptoms at this time. Would not plan for any cardiac testing unless symptoms change. He will let us know if that happens. 3. DM:  A1C is high, around 10 at last check.  Followup with Dr. Buddy Duty as well.  Did not have a dramatic change in DM even when he lost weight. 4. Obesity: We spoke about the importance of weight loss.  He is going to try to exercise more and eat better.   I think conservative measures like improved diet and more exercise would be the best strategy  for him. He changed his diet and lost 40 lbs.  Of note, his sister died unexpectedly in 2014-06-22.  That was to be motivation to make lifestyle modifications.  He "fell off the wagon" for a little while but is trying to make better choices.   Current medicines are  reviewed at length with the patient today.  The patient concerns regarding his medicines were addressed.  The following changes have been made:  No change  Labs/ tests ordered today include:  No orders of the defined types were placed in this encounter.   Recommend 150 minutes/week of aerobic exercise Low fat, low carb, high fiber diet recommended  Disposition:   FU in 1 year   Signed, Larae Grooms, MD  06/02/2016 8:41 AM    [facility] [address], [place], [place]  [address] Phone: [phone]; Fax: [phone]

## 2016-06-02 ENCOUNTER — Ambulatory Visit (INDEPENDENT_AMBULATORY_CARE_PROVIDER_SITE_OTHER): Payer: Medicare Other | Admitting: Interventional Cardiology

## 2016-06-02 ENCOUNTER — Encounter (INDEPENDENT_AMBULATORY_CARE_PROVIDER_SITE_OTHER): Payer: Self-pay

## 2016-06-02 ENCOUNTER — Encounter: Payer: Self-pay | Admitting: Interventional Cardiology

## 2016-06-02 ENCOUNTER — Ambulatory Visit: Payer: Self-pay | Admitting: Interventional Cardiology

## 2016-06-02 VITALS — BP 136/82 | HR 89 | Ht 71.0 in | Wt 238.0 lb

## 2016-06-02 DIAGNOSIS — I739 Peripheral vascular disease, unspecified: Secondary | ICD-10-CM

## 2016-06-02 DIAGNOSIS — I10 Essential (primary) hypertension: Secondary | ICD-10-CM | POA: Diagnosis not present

## 2016-06-02 DIAGNOSIS — E669 Obesity, unspecified: Secondary | ICD-10-CM

## 2016-06-02 DIAGNOSIS — E1151 Type 2 diabetes mellitus with diabetic peripheral angiopathy without gangrene: Secondary | ICD-10-CM

## 2016-06-02 NOTE — Patient Instructions (Signed)

## 2016-06-06 ENCOUNTER — Other Ambulatory Visit: Payer: Self-pay | Admitting: Podiatry

## 2016-06-09 NOTE — Telephone Encounter (Signed)
Pt needs an appt prior to refills. 

## 2016-07-06 DIAGNOSIS — J309 Allergic rhinitis, unspecified: Secondary | ICD-10-CM | POA: Diagnosis not present

## 2016-07-06 DIAGNOSIS — I10 Essential (primary) hypertension: Secondary | ICD-10-CM | POA: Diagnosis not present

## 2016-07-06 DIAGNOSIS — R400 Somnolence: Secondary | ICD-10-CM | POA: Diagnosis not present

## 2016-07-21 DIAGNOSIS — M25531 Pain in right wrist: Secondary | ICD-10-CM | POA: Diagnosis not present

## 2016-07-22 ENCOUNTER — Encounter: Payer: Self-pay | Admitting: Surgery

## 2016-07-23 DIAGNOSIS — G4733 Obstructive sleep apnea (adult) (pediatric): Secondary | ICD-10-CM | POA: Diagnosis not present

## 2016-08-02 ENCOUNTER — Other Ambulatory Visit: Payer: Self-pay | Admitting: Podiatry

## 2016-08-03 ENCOUNTER — Encounter: Payer: Self-pay | Admitting: Surgery

## 2016-08-03 ENCOUNTER — Ambulatory Visit (INDEPENDENT_AMBULATORY_CARE_PROVIDER_SITE_OTHER): Payer: Medicare Other | Admitting: Surgery

## 2016-08-03 ENCOUNTER — Ambulatory Visit (HOSPITAL_COMMUNITY)
Admission: RE | Admit: 2016-08-03 | Discharge: 2016-08-03 | Disposition: A | Discharge status: Home / Self Care | Payer: Medicare Other | Source: Ambulatory Visit | Attending: Family | Admitting: Family

## 2016-08-03 ENCOUNTER — Ambulatory Visit (INDEPENDENT_AMBULATORY_CARE_PROVIDER_SITE_OTHER)
Admission: RE | Admit: 2016-08-03 | Discharge: 2016-08-03 | Disposition: A | Discharge status: Home / Self Care | Payer: Medicare Other | Source: Ambulatory Visit | Attending: Family | Admitting: Family

## 2016-08-03 VITALS — BP 148/89 | HR 88 | Temp 97.9°F | Resp 18 | Ht 71.0 in | Wt 238.0 lb

## 2016-08-03 DIAGNOSIS — I70211 Atherosclerosis of native arteries of extremities with intermittent claudication, right leg: Secondary | ICD-10-CM

## 2016-08-03 DIAGNOSIS — I743 Embolism and thrombosis of arteries of the lower extremities: Secondary | ICD-10-CM | POA: Insufficient documentation

## 2016-08-03 DIAGNOSIS — Z87891 Personal history of nicotine dependence: Secondary | ICD-10-CM

## 2016-08-03 DIAGNOSIS — Z959 Presence of cardiac and vascular implant and graft, unspecified: Secondary | ICD-10-CM

## 2016-08-03 NOTE — Progress Notes (Signed)
[facility] [place]  Patient name: Michael [name] MRN: [id number] DOB: 09-01-1950 Sex: male   REASON FOR VISIT:    Follow up claudication  HISOTRY OF PRESENT ILLNESS:    The patient is back today for followup. He Is a 66 year old male patient who was initially seen by Dr. Kellie Simmering for right leg claudication. I subsequently placed a right superficial femoral artery stent in September 2012. The patient is done very well since that time.  Patient suffers and diabetes.  His most recent hemoglobin A1c was 7.9 and 2017.  He has a history of tobacco use in the past.  He takes Lipitor for hypercholesterolemia.  He is medically managed for hypertension.  He is on dual antiplatelet therapy.  He has noticed some mild claudication in the right leg.  He denies ulcerations or rest pain.  PAST MEDICAL HISTORY:   Past Medical History:  Diagnosis Date  . Allergy   . Anxiety   . Diabetes mellitus   . Hyperlipidemia   . Hypertension      FAMILY HISTORY:   Family History  Problem Relation Age of Onset  . Hyperlipidemia Mother   . Hypertension Mother   . Heart disease Father   . Deep vein thrombosis Father   . Hyperlipidemia Father   . Hypertension Father   . Heart attack Father   . Peripheral vascular disease Father   . Cancer Sister     SOCIAL HISTORY:   Social History  Substance Use Topics  . Smoking status: Never Smoker  . Smokeless tobacco: Former Systems developer    Quit date: 10/26/1981  . Alcohol use 0.0 oz/week     Comment: occasional drink     ALLERGIES:   Allergies  Allergen Reactions  . Actos [Pioglitazone]   . Byetta 10 Mcg Pen [Exenatide]   . Demerol   . Glimepiride     Edema   . Nabumetone   . Oxycodone   . Penicillins      CURRENT MEDICATIONS:   Current Outpatient Prescriptions  Medication Sig Dispense Refill  . ACCU-CHEK AVIVA PLUS test strip AS DIRECTED SEVEN TIMES A DAY IN VITRO 90 DAYS  4  .  amLODipine (NORVASC) 10 MG tablet Take 10 mg by mouth daily.      Marland Kitchen atorvastatin (LIPITOR) 40 MG tablet Take 40 mg by mouth at bedtime.  5  . benazepril (LOTENSIN) 40 MG tablet Take 40 mg by mouth daily.      . clonazePAM (KLONOPIN) 0.5 MG tablet TAKE 1/2-1 TABLET DAILY AS NEEDED FOR SLEEP  5  . clopidogrel (PLAVIX) 75 MG tablet TAKE 1 TABLET BY MOUTH EVERY MORNING WITH BREAKFAST 30 tablet 11  . insulin glargine (LANTUS) 100 UNIT/ML injection Inject 20 Units into the skin at bedtime.     Marland Kitchen ketorolac (TORADOL) 10 MG tablet     . meloxicam (MOBIC) 15 MG tablet TAKE 1 TABLET BY MOUTH EVERY DAY 30 tablet 2  . metFORMIN (GLUCOPHAGE-XR) 500 MG 24 hr tablet     . methocarbamol (ROBAXIN) 500 MG tablet     . metoprolol succinate (TOPROL-XL) 100 MG 24 hr tablet     . mupirocin ointment (BACTROBAN) 2 %     . NOVOLOG FLEXPEN 100 UNIT/ML FlexPen USE 11 UNITS IF BLOOD SUGAR 88 TO 126, THEN 1 UNIT FOR EVERY 40MG /DL ABOVE GOAL.  2  . ondansetron (ZOFRAN) 4 MG tablet     . tamsulosin (FLOMAX) 0.4 MG CAPS capsule TAKE 1 CAPSULE  BY MOUTH EVERY DAY AFTER DINNER  5  . temazepam (RESTORIL) 30 MG capsule     . ibuprofen (ADVIL,MOTRIN) 800 MG tablet Take 800 mg by mouth as needed for moderate pain.   0  . INVOKAMET XR [phone] MG TB24      No current facility-administered medications for this visit.     REVIEW OF SYSTEMS:   [X]  denotes positive finding, [ ]  denotes negative finding Cardiac  Comments:  Chest pain or chest pressure:    Shortness of breath upon exertion:    Short of breath when lying flat:    Irregular heart rhythm:        Vascular    Pain in calf, thigh, or hip brought on by ambulation:    Pain in feet at night that wakes you up from your sleep:     Blood clot in your veins:    Leg swelling:         Pulmonary    Oxygen at home:    Productive cough:     Wheezing:         Neurologic    Sudden weakness in arms or legs:     Sudden numbness in arms or legs:     Sudden onset of  difficulty speaking or slurred speech:    Temporary loss of vision in one eye:     Problems with dizziness:         Gastrointestinal    Blood in stool:     Vomited blood:         Genitourinary    Burning when urinating:     Blood in urine:        Psychiatric    Major depression:         Hematologic    Bleeding problems:    Problems with blood clotting too easily:        Skin    Rashes or ulcers:        Constitutional    Fever or chills:      PHYSICAL EXAM:   Vitals:   08/03/16 1529 08/03/16 1534  BP: (!) 161/86 (!) 148/89  Pulse: 88   Resp: 18   Temp: 97.9 F (36.6 C)   TempSrc: Oral   SpO2: 93%   Weight: 238 lb (108 kg)   Height: 5\' 11"  (1.803 m)     GENERAL: The patient is a well-nourished male, in no acute distress. The vital signs are documented above. CARDIAC: There is a regular rate and rhythm.  VASCULAR: Nonpalpable right pedal pulse PULMONARY: Non-labored respirations MUSCULOSKELETAL: There are no major deformities or cyanosis. NEUROLOGIC: No focal weakness or paresthesias are detected. SKIN: There are no ulcers or rashes noted. PSYCHIATRIC: The patient has a normal affect.  STUDIES:   I have reviewed his vascular lab studies today.  The ABI on the right has decreased from 0.98, 20.88.  Velocity elevations within the right superficial femoral artery stent are now 3 40 cm/s  MEDICAL ISSUES:   In-stent stenosis, right superficial femoral artery stent.  I discussed proceeding with angiography via a left femoral approach, proceeding with aortogram and runoff and intervening on the in-stent stenosis on the right.  This is been scheduled for Tuesday, July 3.    Annamarie Major, MD [facility] [phone] Pager [phone]

## 2016-08-11 DIAGNOSIS — S62001D Unspecified fracture of navicular [scaphoid] bone of right wrist, subsequent encounter for fracture with routine healing: Secondary | ICD-10-CM | POA: Diagnosis not present

## 2016-08-17 DIAGNOSIS — M62830 Muscle spasm of back: Secondary | ICD-10-CM | POA: Diagnosis not present

## 2016-08-17 DIAGNOSIS — M9903 Segmental and somatic dysfunction of lumbar region: Secondary | ICD-10-CM | POA: Diagnosis not present

## 2016-08-17 DIAGNOSIS — M545 Low back pain: Secondary | ICD-10-CM | POA: Diagnosis not present

## 2016-08-17 DIAGNOSIS — M5136 Other intervertebral disc degeneration, lumbar region: Secondary | ICD-10-CM | POA: Diagnosis not present

## 2016-08-18 DIAGNOSIS — M9903 Segmental and somatic dysfunction of lumbar region: Secondary | ICD-10-CM | POA: Diagnosis not present

## 2016-08-18 DIAGNOSIS — M62830 Muscle spasm of back: Secondary | ICD-10-CM | POA: Diagnosis not present

## 2016-08-18 DIAGNOSIS — M545 Low back pain: Secondary | ICD-10-CM | POA: Diagnosis not present

## 2016-08-18 DIAGNOSIS — M5136 Other intervertebral disc degeneration, lumbar region: Secondary | ICD-10-CM | POA: Diagnosis not present

## 2016-08-19 DIAGNOSIS — M5136 Other intervertebral disc degeneration, lumbar region: Secondary | ICD-10-CM | POA: Diagnosis not present

## 2016-08-19 DIAGNOSIS — M9903 Segmental and somatic dysfunction of lumbar region: Secondary | ICD-10-CM | POA: Diagnosis not present

## 2016-08-19 DIAGNOSIS — M545 Low back pain: Secondary | ICD-10-CM | POA: Diagnosis not present

## 2016-08-19 DIAGNOSIS — M62830 Muscle spasm of back: Secondary | ICD-10-CM | POA: Diagnosis not present

## 2016-08-20 ENCOUNTER — Other Ambulatory Visit: Payer: Self-pay

## 2016-08-20 DIAGNOSIS — M5136 Other intervertebral disc degeneration, lumbar region: Secondary | ICD-10-CM | POA: Diagnosis not present

## 2016-08-20 DIAGNOSIS — M9903 Segmental and somatic dysfunction of lumbar region: Secondary | ICD-10-CM | POA: Diagnosis not present

## 2016-08-20 DIAGNOSIS — M62830 Muscle spasm of back: Secondary | ICD-10-CM | POA: Diagnosis not present

## 2016-08-20 DIAGNOSIS — M545 Low back pain: Secondary | ICD-10-CM | POA: Diagnosis not present

## 2016-09-01 ENCOUNTER — Encounter (HOSPITAL_COMMUNITY)
Admission: RE | Disposition: A | Discharge status: Home / Self Care | Payer: Self-pay | Source: Ambulatory Visit | Attending: Surgery

## 2016-09-01 ENCOUNTER — Encounter (HOSPITAL_COMMUNITY): Payer: Self-pay | Admitting: Surgery

## 2016-09-01 ENCOUNTER — Ambulatory Visit (HOSPITAL_COMMUNITY)
Admission: RE | Admit: 2016-09-01 | Discharge: 2016-09-01 | Disposition: A | Discharge status: Home / Self Care | Payer: Medicare Other | Source: Ambulatory Visit | Attending: Surgery | Admitting: Surgery

## 2016-09-01 DIAGNOSIS — Z885 Allergy status to narcotic agent status: Secondary | ICD-10-CM | POA: Insufficient documentation

## 2016-09-01 DIAGNOSIS — Z794 Long term (current) use of insulin: Secondary | ICD-10-CM | POA: Diagnosis not present

## 2016-09-01 DIAGNOSIS — Z7902 Long term (current) use of antithrombotics/antiplatelets: Secondary | ICD-10-CM | POA: Diagnosis not present

## 2016-09-01 DIAGNOSIS — F419 Anxiety disorder, unspecified: Secondary | ICD-10-CM | POA: Diagnosis not present

## 2016-09-01 DIAGNOSIS — E7800 Pure hypercholesterolemia, unspecified: Secondary | ICD-10-CM | POA: Insufficient documentation

## 2016-09-01 DIAGNOSIS — Y838 Other surgical procedures as the cause of abnormal reaction of the patient, or of later complication, without mention of misadventure at the time of the procedure: Secondary | ICD-10-CM | POA: Insufficient documentation

## 2016-09-01 DIAGNOSIS — Z79899 Other long term (current) drug therapy: Secondary | ICD-10-CM | POA: Diagnosis not present

## 2016-09-01 DIAGNOSIS — Z880 Allergy status to penicillin: Secondary | ICD-10-CM | POA: Insufficient documentation

## 2016-09-01 DIAGNOSIS — E1151 Type 2 diabetes mellitus with diabetic peripheral angiopathy without gangrene: Secondary | ICD-10-CM | POA: Diagnosis not present

## 2016-09-01 DIAGNOSIS — Z791 Long term (current) use of non-steroidal anti-inflammatories (NSAID): Secondary | ICD-10-CM | POA: Diagnosis not present

## 2016-09-01 DIAGNOSIS — Z7983 Long term (current) use of bisphosphonates: Secondary | ICD-10-CM | POA: Insufficient documentation

## 2016-09-01 DIAGNOSIS — I10 Essential (primary) hypertension: Secondary | ICD-10-CM | POA: Diagnosis not present

## 2016-09-01 DIAGNOSIS — I70211 Atherosclerosis of native arteries of extremities with intermittent claudication, right leg: Secondary | ICD-10-CM | POA: Diagnosis not present

## 2016-09-01 DIAGNOSIS — T82856A Stenosis of peripheral vascular stent, initial encounter: Secondary | ICD-10-CM | POA: Diagnosis not present

## 2016-09-01 HISTORY — PX: ABDOMINAL AORTOGRAM: CATH118222

## 2016-09-01 HISTORY — PX: PERIPHERAL VASCULAR ATHERECTOMY: CATH118256

## 2016-09-01 HISTORY — PX: LOWER EXTREMITY ANGIOGRAPHY: CATH118251

## 2016-09-01 LAB — POCT I-STAT, CHEM 8
BUN: 19 mg/dL (ref 6–20)
CHLORIDE: 96 mmol/L — AB (ref 101–111)
CREATININE: 0.7 mg/dL (ref 0.61–1.24)
Calcium, Ion: 1.28 mmol/L (ref 1.15–1.40)
Glucose, Bld: 175 mg/dL — ABNORMAL HIGH (ref 65–99)
HEMATOCRIT: 41 % (ref 39.0–52.0)
Hemoglobin: 13.9 g/dL (ref 13.0–17.0)
Potassium: 3.4 mmol/L — ABNORMAL LOW (ref 3.5–5.1)
Sodium: 140 mmol/L (ref 135–145)
TCO2: 31 mmol/L (ref 0–100)

## 2016-09-01 LAB — GLUCOSE, CAPILLARY
Glucose-Capillary: 167 mg/dL — ABNORMAL HIGH (ref 65–99)
Glucose-Capillary: 154 mg/dL — ABNORMAL HIGH (ref 65–99)

## 2016-09-01 LAB — POCT ACTIVATED CLOTTING TIME
ACTIVATED CLOTTING TIME: 241 s
ACTIVATED CLOTTING TIME: 169 s
Activated Clotting Time: 213 seconds

## 2016-09-01 SURGERY — ABDOMINAL AORTOGRAM
Anesthesia: LOCAL | Laterality: Right

## 2016-09-01 MED ORDER — GUAIFENESIN-DM 100-10 MG/5ML PO SYRP: 15.0000 mL | ORAL_SOLUTION | ORAL | Status: DC | PRN

## 2016-09-01 MED ORDER — HEPARIN SODIUM (PORCINE) 1000 UNIT/ML IJ SOLN
INTRAMUSCULAR | Status: AC
  Filled 2016-09-01: qty 1

## 2016-09-01 MED ORDER — ONDANSETRON HCL 4 MG/2ML IJ SOLN: 4.0000 mg | Freq: Four times a day (QID) | INTRAMUSCULAR | Status: DC | PRN

## 2016-09-01 MED ORDER — DOCUSATE SODIUM 100 MG PO CAPS: 100.0000 mg | ORAL_CAPSULE | Freq: Every day | ORAL | Status: DC

## 2016-09-01 MED ORDER — SODIUM CHLORIDE 0.9 % IV SOLN
INTRAVENOUS | Status: DC
  Administered 2016-09-01: 06:00:00 via INTRAVENOUS

## 2016-09-01 MED ORDER — MIDAZOLAM HCL 2 MG/2ML IJ SOLN
INTRAMUSCULAR | Status: AC
Start: 2016-09-01 — End: ?
  Filled 2016-09-01: qty 2

## 2016-09-01 MED ORDER — SODIUM CHLORIDE 0.9 % IV SOLN
INTRAVENOUS | Status: DC | PRN
  Administered 2016-09-01: 08:00:00 via SURGICAL_CAVITY

## 2016-09-01 MED ORDER — LABETALOL HCL 5 MG/ML IV SOLN: 10.0000 mg | INTRAVENOUS | Status: DC | PRN

## 2016-09-01 MED ORDER — SODIUM CHLORIDE 0.9 % IV SOLN: 1.0000 mL/kg/h | INTRAVENOUS | Status: DC

## 2016-09-01 MED ORDER — MIDAZOLAM HCL 2 MG/2ML IJ SOLN
INTRAMUSCULAR | Status: DC | PRN
  Administered 2016-09-01 (×5): 1 mg via INTRAVENOUS

## 2016-09-01 MED ORDER — ACETAMINOPHEN 325 MG RE SUPP: 325.0000 mg | RECTAL | Status: DC | PRN

## 2016-09-01 MED ORDER — MIDAZOLAM HCL 2 MG/2ML IJ SOLN
INTRAMUSCULAR | Status: AC
  Filled 2016-09-01: qty 2

## 2016-09-01 MED ORDER — MORPHINE SULFATE (PF) 10 MG/ML IV SOLN: 2.0000 mg | INTRAVENOUS | Status: DC | PRN

## 2016-09-01 MED ORDER — ACETAMINOPHEN 325 MG PO TABS: 325.0000 mg | ORAL_TABLET | ORAL | Status: DC | PRN

## 2016-09-01 MED ORDER — HYDRALAZINE HCL 20 MG/ML IJ SOLN: 5.0000 mg | INTRAMUSCULAR | Status: DC | PRN

## 2016-09-01 MED ORDER — HEPARIN SODIUM (PORCINE) 1000 UNIT/ML IJ SOLN
INTRAMUSCULAR | Status: DC | PRN
  Administered 2016-09-01: 10000 [IU] via INTRAVENOUS

## 2016-09-01 MED ORDER — FENTANYL CITRATE (PF) 100 MCG/2ML IJ SOLN
INTRAMUSCULAR | Status: AC
  Filled 2016-09-01: qty 2

## 2016-09-01 MED ORDER — ALUM & MAG HYDROXIDE-SIMETH 200-200-20 MG/5ML PO SUSP: 15.0000 mL | ORAL | Status: DC | PRN

## 2016-09-01 MED ORDER — IODIXANOL 320 MG/ML IV SOLN
INTRAVENOUS | Status: DC | PRN
  Administered 2016-09-01: 175 mL via INTRA_ARTERIAL

## 2016-09-01 MED ORDER — SODIUM CHLORIDE 0.9 % IV SOLN: 500.0000 mL | Freq: Once | INTRAVENOUS | Status: DC | PRN

## 2016-09-01 MED ORDER — FENTANYL CITRATE (PF) 100 MCG/2ML IJ SOLN
INTRAMUSCULAR | Status: DC | PRN
  Administered 2016-09-01 (×4): 25 ug via INTRAVENOUS
  Administered 2016-09-01: 50 ug via INTRAVENOUS

## 2016-09-01 MED ORDER — PHENOL 1.4 % MT LIQD: 1.0000 | OROMUCOSAL | Status: DC | PRN

## 2016-09-01 MED ORDER — FENTANYL CITRATE (PF) 100 MCG/2ML IJ SOLN
INTRAMUSCULAR | Status: AC
Start: 2016-09-01 — End: ?
  Filled 2016-09-01: qty 2

## 2016-09-01 MED ORDER — LIDOCAINE HCL (PF) 1 % IJ SOLN
INTRAMUSCULAR | Status: AC
  Filled 2016-09-01: qty 30

## 2016-09-01 MED ORDER — HEPARIN (PORCINE) IN NACL 2-0.9 UNIT/ML-% IJ SOLN
INTRAMUSCULAR | Status: AC
  Filled 2016-09-01: qty 1000

## 2016-09-01 MED ORDER — LIDOCAINE HCL (PF) 1 % IJ SOLN
INTRAMUSCULAR | Status: DC | PRN
  Administered 2016-09-01: 08:00:00

## 2016-09-01 MED ORDER — LIDOCAINE HCL (PF) 1 % IJ SOLN
INTRAMUSCULAR | Status: DC | PRN
  Administered 2016-09-01: 10 mL

## 2016-09-01 MED ORDER — METOPROLOL TARTRATE 5 MG/5ML IV SOLN: 2.0000 mg | INTRAVENOUS | Status: DC | PRN

## 2016-09-01 SURGICAL SUPPLY — 30 items
BAG SNAP BAND KOVER 36X36 (MISCELLANEOUS) ×1 IMPLANT
BALLN COYOTE OTW 3X80X150 (BALLOONS) ×3
BALLN LUTONIX 6X150X130 (BALLOONS) ×3
BALLN STERLING OTW 6X150X150 (BALLOONS) ×6
BALLOON COYOTE OTW 3X80X150 (BALLOONS) IMPLANT
BALLOON LUTONIX 6X150X130 (BALLOONS) IMPLANT
BALLOON STERLING OTW 6X150X150 (BALLOONS) IMPLANT
BUR JETSTREAM XC 2.4/3.4 (BURR) IMPLANT
BURR JETSTREAM XC 2.4/3.4 (BURR) ×3
CATH OMNI FLUSH 5F 65CM (CATHETERS) ×1 IMPLANT
CATH QUICKCROSS SUPP .035X90CM (MICROCATHETER) ×1 IMPLANT
COVER DOME SNAP 22 D (MISCELLANEOUS) ×1 IMPLANT
DEVICE EMBOSHIELD NAV6 4.0-7.0 (WIRE) ×1 IMPLANT
DEVICE TORQUE H2O (MISCELLANEOUS) ×1 IMPLANT
GUIDEWIRE ANGLED .035X150CM (WIRE) ×2 IMPLANT
GUIDEWIRE ANGLED .035X260CM (WIRE) ×1 IMPLANT
KIT ENCORE 26 ADVANTAGE (KITS) ×1 IMPLANT
KIT MICROINTRODUCER STIFF 5F (SHEATH) ×1 IMPLANT
KIT PV (KITS) ×3 IMPLANT
LUBRICANT VIPERSLIDE CORONARY (MISCELLANEOUS) ×1 IMPLANT
SHEATH PINNACLE 5F 10CM (SHEATH) ×1 IMPLANT
SHEATH PINNACLE ST 7F 45CM (SHEATH) ×1 IMPLANT
SHIELD RADPAD SCOOP 12X17 (MISCELLANEOUS) ×2 IMPLANT
SYR MEDRAD MARK V 150ML (SYRINGE) ×3 IMPLANT
TAPE RADIOPAQUE TURBO (MISCELLANEOUS) ×1 IMPLANT
TRANSDUCER W/STOPCOCK (MISCELLANEOUS) ×3 IMPLANT
TRAY PV CATH (CUSTOM PROCEDURE TRAY) ×3 IMPLANT
WIRE BAREWIRE WORK .014X315CM (WIRE) ×1 IMPLANT
WIRE BENTSON .035X145CM (WIRE) ×1 IMPLANT
WIRE ROSEN-J .035X180CM (WIRE) ×1 IMPLANT

## 2016-09-01 NOTE — Interval H&P Note (Signed)
History and Physical Interval Note:  09/01/2016 7:26 AM  [name]  has presented today for surgery, with the diagnosis of pvd  The various methods of treatment have been discussed with the patient and family. After consideration of risks, benefits and other options for treatment, the patient has consented to  Procedure(s): Abdominal Aortogram (N/A) Lower Extremity Angiography (N/A) as a surgical intervention .  The patient's history has been reviewed, patient examined, no change in status, stable for surgery.  I have reviewed the patient's chart and labs.  Questions were answered to the patient's satisfaction.     Michael [name]

## 2016-09-01 NOTE — Discharge Instructions (Signed)
Angiogram, Care After °This sheet gives you information about how to care for yourself after your procedure. Your health care provider may also give you more specific instructions. If you have problems or questions, contact your health care provider. °What can I expect after the procedure? °After the procedure, it is common to have bruising and tenderness at the catheter insertion area. °Follow these instructions at home: °Insertion site care  °· Follow instructions from your health care provider about how to take care of your insertion site. Make sure you: °¨ Wash your hands with soap and water before you change your bandage (dressing). If soap and water are not available, use hand sanitizer. °¨ Change your dressing as told by your health care provider. °¨ Leave stitches (sutures), skin glue, or adhesive strips in place. These skin closures may need to stay in place for 2 weeks or longer. If adhesive strip edges start to loosen and curl up, you may trim the loose edges. Do not remove adhesive strips completely unless your health care provider tells you to do that. °· Do not take baths, swim, or use a hot tub until your health care provider approves. °· You may shower 24-48 hours after the procedure or as told by your health care provider. °¨ Gently wash the site with plain soap and water. °¨ Pat the area dry with a clean towel. °¨ Do not rub the site. This may cause bleeding. °· Do not apply powder or lotion to the site. Keep the site clean and dry. °· Check your insertion site every day for signs of infection. Check for: °¨ Redness, swelling, or pain. °¨ Fluid or blood. °¨ Warmth. °¨ Pus or a bad smell. °Activity  °· Rest as told by your health care provider, usually for 1-2 days. °· Do not lift anything that is heavier than 10 lbs. (4.5 kg) or as told by your health care provider. °· Do not drive for 24 hours if you were given a medicine to help you relax (sedative). °· Do not drive or use heavy machinery while  taking prescription pain medicine. °General instructions  °· Return to your normal activities as told by your health care provider, usually in about a week. Ask your health care provider what activities are safe for you. °· If the catheter site starts bleeding, lie flat and put pressure on the site. If the bleeding does not stop, get help right away. This is a medical emergency. °· Drink enough fluid to keep your urine clear or pale yellow. This helps flush the contrast dye from your body. °· Take over-the-counter and prescription medicines only as told by your health care provider. °· Keep all follow-up visits as told by your health care provider. This is important. °Contact a health care provider if: °· You have a fever or chills. °· You have redness, swelling, or pain around your insertion site. °· You have fluid or blood coming from your insertion site. °· The insertion site feels warm to the touch. °· You have pus or a bad smell coming from your insertion site. °· You have bruising around the insertion site. °· You notice blood collecting in the tissue around the catheter site (hematoma). The hematoma may be painful to the touch. °Get help right away if: °· You have severe pain at the catheter insertion area. °· The catheter insertion area swells very fast. °· The catheter insertion area is bleeding, and the bleeding does not stop when you hold steady pressure on   the area. °· The area near or just beyond the catheter insertion site becomes pale, cool, tingly, or numb. °These symptoms may represent a serious problem that is an emergency. Do not wait to see if the symptoms will go away. Get medical help right away. Call your local emergency services (911 in the [place] Do not drive yourself to the hospital. °Summary °· After the procedure, it is common to have bruising and tenderness at the catheter insertion area. °· After the procedure, it is important to rest and drink plenty of fluids. °· Do not take baths,  swim, or use a hot tub until your health care provider says it is okay to do so. You may shower 24-48 hours after the procedure or as told by your health care provider. °· If the catheter site starts bleeding, lie flat and put pressure on the site. If the bleeding does not stop, get help right away. This is a medical emergency. °This information is not intended to replace advice given to you by your health care provider. Make sure you discuss any questions you have with your health care provider. °Document Released: 09/04/2004 Document Revised: 01/22/2016 Document Reviewed: 01/22/2016 °Elsevier Interactive Patient Education © 2017 [employer] ° °

## 2016-09-01 NOTE — Progress Notes (Addendum)
Site area: LFA Site Prior to Removal:  Level 0 Pressure Applied For:24 min Manual:  yes  Patient Status During Pull:  stable Post Pull Site:  Level 0 Post Pull Instructions Given: yes  Post Pull Pulses Present: palpable Dressing Applied: tegaderm  Bedrest begins @ 7517 till 0017 Comments:

## 2016-09-01 NOTE — Op Note (Signed)
Patient name: Michael [name] MRN: [id number] DOB: Jun 20, 1950 Sex: male  09/01/2016 Pre-operative Diagnosis: In stent stenosis Post-operative diagnosis:  Same Surgeon:  Annamarie Major Procedure Performed:  1.  Ultrasound-guided access, left femoral artery  2.  Abdominal aortogram  3.  Right lower extremity runoff  4.  Atherectomy with drug coated balloon angioplasty, right superficial femoral artery  5.  Conscious sedation (96 minutes)    Indications:  The patient has a history of right superficial femoral artery stenting for claudication.  He has developed recurrent symptoms.  Ultrasound suggested stenosis within the stent  Procedure:  The patient was identified in the holding area and taken to room 8.  The patient was then placed supine on the table and prepped and draped in the usual sterile fashion.  A time out was called.  Conscious sedation was administered with the use of IV fentanyl and Versed in a continuous physician and nurse monitoring.  Heart rate, blood pressure, and oxygen saturations were continuously monitored.  Ultrasound was used to evaluate the left common femoral artery.  It was patent .  A digital ultrasound image was acquired.  A micropuncture needle was used to access the left common femoral artery under ultrasound guidance.  An 018 wire was advanced without resistance and a micropuncture sheath was placed.  The 018 wire was removed and a benson wire was placed.  The micropuncture sheath was exchanged for a 5 french sheath.  An omniflush catheter was advanced over the wire to the level of L-1.  An abdominal angiogram was obtained.  Next, using the omniflush catheter and a benson wire, the aortic bifurcation was crossed and the catheter was placed into theright external iliac artery and right runoff was obtained.    Findings:   Aortogram:  No significant renal artery stenosis.  The infrarenal abdominal aorta is widely patent.  Bilateral common and external iliac arteries  are widely patent.  Right Lower Extremity:  The right common femoral and profunda femoral artery widely patent.  The overlapping stents are visualized within the right superficial femoral artery.  There were several areas of heavily calcified recurrent stenosis approximately 85%.  The popliteal artery is patent throughout its course there is single vessel runoff via the peroneal artery.  Left Lower Extremity:  Not evaluated  Intervention:  After the above images were acquired the decision was made to proceed with intervention.  A 7 French 45 cm Terumo sheath was advanced over the aortic bifurcation over a Rosen wire into the right external iliac artery.  The patient was fully heparinized.  Using a Glidewire and a quick cross catheter, the superficial femoral artery was selected and the catheter and wire were advanced into the popliteal artery across the stenosis.  A contrast injection was performed at this point, confirming intra-arterial positioning.  Next, a large NAV-6 filter was inserted and deployed in the popliteal artery at the level of the joint space.  I then selected an AngioJet large atherectomy device I was able to advance the device with blade down through approximate 70% of the stent however it would not go around the distal stenosis.  AngioJet was then removed and I inserted a 3 mm balloon and perform balloon angioplasty of the distal portion of the stent.  The AngioJet was then reinserted and again I got hung up and could not advance it across the stenosis in the distal portion of the stent.  At this point in time the patient was getting very anxious  because of his shoulder pain I inserted a 6 x 1 50 drug coated with tonics balloon.  Unfortunately I could not get this to advance within the stent.  I had to remove it I then selected a 6 x 1 50 Sterling balloon and perform balloon angioplasty of the entire length of the stent.  The balloon did rupture once during angioplasty.  Once the entire  balloon had been angioplasty, performed a completion arteriogram which revealed an excellent angiographic result with residual stenosis of less than 10% throughout the entire stent.  No significant change in the runoff catheters and wires were removed.  The patient will be taken the holding area for sheath pull once his coagulation profile corrects  Impression:  #1  high-grade heavily calcified in stent stenosis of the right superficial femoral artery.  This was treated with a combination of atherectomy and angioplasty.  I was unable to cross the distal lesion with the atherectomy device but was able to get a good result with balloon angioplasty.  Residual stenosis throughout was less than 10%.  #2  single vessel runoff via the peroneal artery with reconstitution of the posterior tibial artery \  V. Annamarie Major, M.D. [facility] Office: [phone] Pager:  [phone]

## 2016-09-01 NOTE — H&P (View-Only) (Signed)
[facility]  Patient name: Michael [name] MRN: [id number] DOB: [birth year] Sex: male   REASON FOR VISIT:    Follow up claudication  HISOTRY OF PRESENT ILLNESS:    The patient is back today for followup. He Is a 66 year old male patient who was initially seen by Dr. Kellie Simmering for right leg claudication. I subsequently placed a right superficial femoral artery stent in September 2012. The patient is done very well since that time.  Patient suffers and diabetes.  His most recent hemoglobin A1c was 7.9 and 2017.  He has a history of tobacco use in the past.  He takes Lipitor for hypercholesterolemia.  He is medically managed for hypertension.  He is on dual antiplatelet therapy.  He has noticed some mild claudication in the right leg.  He denies ulcerations or rest pain.  PAST MEDICAL HISTORY:   Past Medical History:  Diagnosis Date  . Allergy   . Anxiety   . Diabetes mellitus   . Hyperlipidemia   . Hypertension      FAMILY HISTORY:   Family History  Problem Relation Age of Onset  . Hyperlipidemia Mother   . Hypertension Mother   . Heart disease Father   . Deep vein thrombosis Father   . Hyperlipidemia Father   . Hypertension Father   . Heart attack Father   . Peripheral vascular disease Father   . Cancer Sister     SOCIAL HISTORY:   Social History  Substance Use Topics  . Smoking status: Never Smoker  . Smokeless tobacco: Former Systems developer    Quit date: 10/26/1981  . Alcohol use 0.0 oz/week     Comment: occasional drink     ALLERGIES:   Allergies  Allergen Reactions  . Actos [Pioglitazone]   . Byetta 10 Mcg Pen [Exenatide]   . Demerol   . Glimepiride     Edema   . Nabumetone   . Oxycodone   . Penicillins      CURRENT MEDICATIONS:   Current Outpatient Prescriptions  Medication Sig Dispense Refill  . ACCU-CHEK AVIVA PLUS test strip AS DIRECTED SEVEN TIMES A DAY IN VITRO 90 DAYS  4  .  amLODipine (NORVASC) 10 MG tablet Take 10 mg by mouth daily.      Marland Kitchen atorvastatin (LIPITOR) 40 MG tablet Take 40 mg by mouth at bedtime.  5  . benazepril (LOTENSIN) 40 MG tablet Take 40 mg by mouth daily.      . clonazePAM (KLONOPIN) 0.5 MG tablet TAKE 1/2-1 TABLET DAILY AS NEEDED FOR SLEEP  5  . clopidogrel (PLAVIX) 75 MG tablet TAKE 1 TABLET BY MOUTH EVERY MORNING WITH BREAKFAST 30 tablet 11  . insulin glargine (LANTUS) 100 UNIT/ML injection Inject 20 Units into the skin at bedtime.     Marland Kitchen ketorolac (TORADOL) 10 MG tablet     . meloxicam (MOBIC) 15 MG tablet TAKE 1 TABLET BY MOUTH EVERY DAY 30 tablet 2  . metFORMIN (GLUCOPHAGE-XR) 500 MG 24 hr tablet     . methocarbamol (ROBAXIN) 500 MG tablet     . metoprolol succinate (TOPROL-XL) 100 MG 24 hr tablet     . mupirocin ointment (BACTROBAN) 2 %     . NOVOLOG FLEXPEN 100 UNIT/ML FlexPen USE 11 UNITS IF BLOOD SUGAR 88 TO 126, THEN 1 UNIT FOR EVERY 40MG /DL ABOVE GOAL.  2  . ondansetron (ZOFRAN) 4 MG tablet     . tamsulosin (FLOMAX) 0.4 MG CAPS capsule TAKE 1 CAPSULE  BY MOUTH EVERY DAY AFTER DINNER  5  . temazepam (RESTORIL) 30 MG capsule     . ibuprofen (ADVIL,MOTRIN) 800 MG tablet Take 800 mg by mouth as needed for moderate pain.   0  . INVOKAMET XR [phone] MG TB24      No current facility-administered medications for this visit.     REVIEW OF SYSTEMS:   [X]  denotes positive finding, [ ]  denotes negative finding Cardiac  Comments:  Chest pain or chest pressure:    Shortness of breath upon exertion:    Short of breath when lying flat:    Irregular heart rhythm:        Vascular    Pain in calf, thigh, or hip brought on by ambulation:    Pain in feet at night that wakes you up from your sleep:     Blood clot in your veins:    Leg swelling:         Pulmonary    Oxygen at home:    Productive cough:     Wheezing:         Neurologic    Sudden weakness in arms or legs:     Sudden numbness in arms or legs:     Sudden onset of  difficulty speaking or slurred speech:    Temporary loss of vision in one eye:     Problems with dizziness:         Gastrointestinal    Blood in stool:     Vomited blood:         Genitourinary    Burning when urinating:     Blood in urine:        Psychiatric    Major depression:         Hematologic    Bleeding problems:    Problems with blood clotting too easily:        Skin    Rashes or ulcers:        Constitutional    Fever or chills:      PHYSICAL EXAM:   Vitals:   08/03/16 1529 08/03/16 1534  BP: (!) 161/86 (!) 148/89  Pulse: 88   Resp: 18   Temp: 97.9 F (36.6 C)   TempSrc: Oral   SpO2: 93%   Weight: 238 lb (108 kg)   Height: 5\' 11"  (1.803 m)     GENERAL: The patient is a well-nourished male, in no acute distress. The vital signs are documented above. CARDIAC: There is a regular rate and rhythm.  VASCULAR: Nonpalpable right pedal pulse PULMONARY: Non-labored respirations MUSCULOSKELETAL: There are no major deformities or cyanosis. NEUROLOGIC: No focal weakness or paresthesias are detected. SKIN: There are no ulcers or rashes noted. PSYCHIATRIC: The patient has a normal affect.  STUDIES:   I have reviewed his vascular lab studies today.  The ABI on the right has decreased from 0.98, 20.88.  Velocity elevations within the right superficial femoral artery stent are now 3 40 cm/s  MEDICAL ISSUES:   In-stent stenosis, right superficial femoral artery stent.  I discussed proceeding with angiography via a left femoral approach, proceeding with aortogram and runoff and intervening on the in-stent stenosis on the right.  This is been scheduled for Tuesday, July 3.    Annamarie Major, MD [facility] [phone] Pager [phone]

## 2016-09-03 DIAGNOSIS — M62830 Muscle spasm of back: Secondary | ICD-10-CM | POA: Diagnosis not present

## 2016-09-03 DIAGNOSIS — M545 Low back pain: Secondary | ICD-10-CM | POA: Diagnosis not present

## 2016-09-03 DIAGNOSIS — M9903 Segmental and somatic dysfunction of lumbar region: Secondary | ICD-10-CM | POA: Diagnosis not present

## 2016-09-03 DIAGNOSIS — M5136 Other intervertebral disc degeneration, lumbar region: Secondary | ICD-10-CM | POA: Diagnosis not present

## 2016-09-04 ENCOUNTER — Telehealth: Payer: Self-pay | Admitting: Surgery

## 2016-09-04 NOTE — Telephone Encounter (Signed)
-----   Message from Mena Goes, RN sent at 09/01/2016  9:49 AM EDT ----- Regarding: 1 month appt w/ ABI and LE duplex   ----- Message ----- From: Serafina Mitchell, MD Sent: 09/01/2016   9:15 AM To: [facility] Charge Pool  09/01/2016:  Surgeon:  Annamarie Major Procedure Performed:  1.  Ultrasound-guided access, left femoral artery  2.  Abdominal aortogram  3.  Right lower extremity runoff  4.  Atherectomy with drug coated balloon angioplasty, right superficial femoral artery  5.  Conscious sedation (96 minutes)   Follow-up one month with a duplex of the right leg and ABI to see Michael [name]

## 2016-09-04 NOTE — Telephone Encounter (Signed)
Sched appt 10/14/16; lab at 2:00 and MD at 3:15. Spoke to pt.

## 2016-09-07 ENCOUNTER — Other Ambulatory Visit: Payer: Self-pay | Admitting: Podiatry

## 2016-09-08 DIAGNOSIS — S62001D Unspecified fracture of navicular [scaphoid] bone of right wrist, subsequent encounter for fracture with routine healing: Secondary | ICD-10-CM | POA: Diagnosis not present

## 2016-09-15 DIAGNOSIS — M62830 Muscle spasm of back: Secondary | ICD-10-CM | POA: Diagnosis not present

## 2016-09-15 DIAGNOSIS — M9903 Segmental and somatic dysfunction of lumbar region: Secondary | ICD-10-CM | POA: Diagnosis not present

## 2016-09-15 DIAGNOSIS — M5136 Other intervertebral disc degeneration, lumbar region: Secondary | ICD-10-CM | POA: Diagnosis not present

## 2016-09-15 DIAGNOSIS — M545 Low back pain: Secondary | ICD-10-CM | POA: Diagnosis not present

## 2016-09-17 DIAGNOSIS — M5136 Other intervertebral disc degeneration, lumbar region: Secondary | ICD-10-CM | POA: Diagnosis not present

## 2016-09-17 DIAGNOSIS — M9903 Segmental and somatic dysfunction of lumbar region: Secondary | ICD-10-CM | POA: Diagnosis not present

## 2016-09-17 DIAGNOSIS — M545 Low back pain: Secondary | ICD-10-CM | POA: Diagnosis not present

## 2016-09-17 DIAGNOSIS — M62830 Muscle spasm of back: Secondary | ICD-10-CM | POA: Diagnosis not present

## 2016-09-21 DIAGNOSIS — M545 Low back pain: Secondary | ICD-10-CM | POA: Diagnosis not present

## 2016-09-21 DIAGNOSIS — Z0000 Encounter for general adult medical examination without abnormal findings: Secondary | ICD-10-CM | POA: Diagnosis not present

## 2016-09-21 DIAGNOSIS — Z1159 Encounter for screening for other viral diseases: Secondary | ICD-10-CM | POA: Diagnosis not present

## 2016-09-21 DIAGNOSIS — Z23 Encounter for immunization: Secondary | ICD-10-CM | POA: Diagnosis not present

## 2016-09-21 DIAGNOSIS — R972 Elevated prostate specific antigen [PSA]: Secondary | ICD-10-CM | POA: Diagnosis not present

## 2016-09-21 DIAGNOSIS — I10 Essential (primary) hypertension: Secondary | ICD-10-CM | POA: Diagnosis not present

## 2016-09-21 DIAGNOSIS — F419 Anxiety disorder, unspecified: Secondary | ICD-10-CM | POA: Diagnosis not present

## 2016-09-21 DIAGNOSIS — G479 Sleep disorder, unspecified: Secondary | ICD-10-CM | POA: Diagnosis not present

## 2016-09-21 DIAGNOSIS — M9903 Segmental and somatic dysfunction of lumbar region: Secondary | ICD-10-CM | POA: Diagnosis not present

## 2016-09-21 DIAGNOSIS — M62830 Muscle spasm of back: Secondary | ICD-10-CM | POA: Diagnosis not present

## 2016-09-21 DIAGNOSIS — M5136 Other intervertebral disc degeneration, lumbar region: Secondary | ICD-10-CM | POA: Diagnosis not present

## 2016-09-21 DIAGNOSIS — Z125 Encounter for screening for malignant neoplasm of prostate: Secondary | ICD-10-CM | POA: Diagnosis not present

## 2016-09-24 DIAGNOSIS — M9903 Segmental and somatic dysfunction of lumbar region: Secondary | ICD-10-CM | POA: Diagnosis not present

## 2016-09-24 DIAGNOSIS — M545 Low back pain: Secondary | ICD-10-CM | POA: Diagnosis not present

## 2016-09-24 DIAGNOSIS — M5136 Other intervertebral disc degeneration, lumbar region: Secondary | ICD-10-CM | POA: Diagnosis not present

## 2016-09-24 DIAGNOSIS — M62830 Muscle spasm of back: Secondary | ICD-10-CM | POA: Diagnosis not present

## 2016-09-28 DIAGNOSIS — M62830 Muscle spasm of back: Secondary | ICD-10-CM | POA: Diagnosis not present

## 2016-09-28 DIAGNOSIS — M545 Low back pain: Secondary | ICD-10-CM | POA: Diagnosis not present

## 2016-09-28 DIAGNOSIS — M5136 Other intervertebral disc degeneration, lumbar region: Secondary | ICD-10-CM | POA: Diagnosis not present

## 2016-09-28 DIAGNOSIS — M9903 Segmental and somatic dysfunction of lumbar region: Secondary | ICD-10-CM | POA: Diagnosis not present

## 2016-09-29 DIAGNOSIS — G4733 Obstructive sleep apnea (adult) (pediatric): Secondary | ICD-10-CM | POA: Diagnosis not present

## 2016-10-01 DIAGNOSIS — M9903 Segmental and somatic dysfunction of lumbar region: Secondary | ICD-10-CM | POA: Diagnosis not present

## 2016-10-01 DIAGNOSIS — M62830 Muscle spasm of back: Secondary | ICD-10-CM | POA: Diagnosis not present

## 2016-10-01 DIAGNOSIS — M545 Low back pain: Secondary | ICD-10-CM | POA: Diagnosis not present

## 2016-10-01 DIAGNOSIS — M5136 Other intervertebral disc degeneration, lumbar region: Secondary | ICD-10-CM | POA: Diagnosis not present

## 2016-10-05 DIAGNOSIS — M9903 Segmental and somatic dysfunction of lumbar region: Secondary | ICD-10-CM | POA: Diagnosis not present

## 2016-10-05 DIAGNOSIS — M62830 Muscle spasm of back: Secondary | ICD-10-CM | POA: Diagnosis not present

## 2016-10-05 DIAGNOSIS — M545 Low back pain: Secondary | ICD-10-CM | POA: Diagnosis not present

## 2016-10-05 DIAGNOSIS — M5136 Other intervertebral disc degeneration, lumbar region: Secondary | ICD-10-CM | POA: Diagnosis not present

## 2016-10-06 ENCOUNTER — Encounter: Payer: Self-pay | Admitting: Family

## 2016-10-07 DIAGNOSIS — R972 Elevated prostate specific antigen [PSA]: Secondary | ICD-10-CM | POA: Diagnosis not present

## 2016-10-07 DIAGNOSIS — Z794 Long term (current) use of insulin: Secondary | ICD-10-CM | POA: Diagnosis not present

## 2016-10-07 DIAGNOSIS — E1151 Type 2 diabetes mellitus with diabetic peripheral angiopathy without gangrene: Secondary | ICD-10-CM | POA: Diagnosis not present

## 2016-10-07 DIAGNOSIS — E230 Hypopituitarism: Secondary | ICD-10-CM | POA: Diagnosis not present

## 2016-10-07 DIAGNOSIS — Z79899 Other long term (current) drug therapy: Secondary | ICD-10-CM | POA: Diagnosis not present

## 2016-10-08 DIAGNOSIS — M545 Low back pain: Secondary | ICD-10-CM | POA: Diagnosis not present

## 2016-10-08 DIAGNOSIS — M5136 Other intervertebral disc degeneration, lumbar region: Secondary | ICD-10-CM | POA: Diagnosis not present

## 2016-10-08 DIAGNOSIS — M9903 Segmental and somatic dysfunction of lumbar region: Secondary | ICD-10-CM | POA: Diagnosis not present

## 2016-10-08 DIAGNOSIS — M62830 Muscle spasm of back: Secondary | ICD-10-CM | POA: Diagnosis not present

## 2016-10-09 NOTE — Addendum Note (Signed)
Addended by: Lianne Cure A on: 10/09/2016 12:06 PM   Modules accepted: Orders

## 2016-10-14 ENCOUNTER — Ambulatory Visit (INDEPENDENT_AMBULATORY_CARE_PROVIDER_SITE_OTHER)
Admission: RE | Admit: 2016-10-14 | Discharge: 2016-10-14 | Disposition: A | Discharge status: Home / Self Care | Payer: Medicare Other | Source: Ambulatory Visit | Attending: Family | Admitting: Family

## 2016-10-14 ENCOUNTER — Encounter: Payer: Self-pay | Admitting: Family

## 2016-10-14 ENCOUNTER — Ambulatory Visit (INDEPENDENT_AMBULATORY_CARE_PROVIDER_SITE_OTHER): Payer: Self-pay | Admitting: Family

## 2016-10-14 ENCOUNTER — Ambulatory Visit (HOSPITAL_COMMUNITY)
Admission: RE | Admit: 2016-10-14 | Discharge: 2016-10-14 | Disposition: A | Discharge status: Home / Self Care | Payer: Medicare Other | Source: Ambulatory Visit | Attending: Family | Admitting: Family

## 2016-10-14 VITALS — BP 147/89 | HR 68 | Temp 97.0°F | Resp 20 | Ht 72.0 in | Wt 239.0 lb

## 2016-10-14 DIAGNOSIS — Z95820 Peripheral vascular angioplasty status with implants and grafts: Secondary | ICD-10-CM | POA: Insufficient documentation

## 2016-10-14 DIAGNOSIS — Z87891 Personal history of nicotine dependence: Secondary | ICD-10-CM

## 2016-10-14 DIAGNOSIS — Z9889 Other specified postprocedural states: Secondary | ICD-10-CM | POA: Insufficient documentation

## 2016-10-14 DIAGNOSIS — I70211 Atherosclerosis of native arteries of extremities with intermittent claudication, right leg: Secondary | ICD-10-CM

## 2016-10-14 DIAGNOSIS — Z959 Presence of cardiac and vascular implant and graft, unspecified: Secondary | ICD-10-CM

## 2016-10-14 DIAGNOSIS — Z8679 Personal history of other diseases of the circulatory system: Secondary | ICD-10-CM

## 2016-10-14 LAB — VAS US LOWER EXTREMITY ARTERIAL DUPLEX: RSFPPSV: 82 cm/s

## 2016-10-14 NOTE — Patient Instructions (Signed)

## 2016-10-14 NOTE — Progress Notes (Signed)
Postoperative Visit   History of Present Illness  Michael [name] is a 66 y.o. male who is s/p abdominal aortogram with right lower extremity runoff, and atherectomy with drug coated balloon angioplasty of right superficial femoral artery on 09-01-16 by Dr. Trula Slade for in stent stenosis. The patient has a history of right superficial femoral artery stenting for claudication.  He developed recurrent symptoms.  Ultrasound suggested stenosis within the stent.  He returns today for one month follow up with a duplex of the right leg and ABI.  He is walking a mile daily, his right calf claudication has resolved.   Pt states his last A1C was 9.8, Dr. Buddy Duty is his endocrinologist.    The patient is able to complete his activities of daily living.    He states his PSA was 4.9, is scheduled to see a urologist in [facility] on 11-03-16.    For VQI Use Only  PRE-ADM LIVING: Home  AMB STATUS: Ambulatory   Past Medical History:  Diagnosis Date  . Allergy   . Anxiety   . Diabetes mellitus   . Hyperlipidemia   . Hypertension     Past Surgical History:  Procedure Laterality Date  . ABDOMINAL AORTOGRAM N/A 09/01/2016   Procedure: Abdominal Aortogram;  Surgeon: Serafina Mitchell, MD;  Location: [place] [facility];  Service: Cardiovascular;  Laterality: N/A;  . ANGIOPLASTY / STENTING FEMORAL  11/05/10   Left SFA stent  . KNEE ARTHROSCOPY    . LOWER EXTREMITY ANGIOGRAPHY N/A 09/01/2016   Procedure: Lower Extremity Angiography;  Surgeon: Serafina Mitchell, MD;  Location: [place] [facility];  Service: Cardiovascular;  Laterality: N/A;  . PERIPHERAL VASCULAR ATHERECTOMY Right 09/01/2016   Procedure: Peripheral Vascular Atherectomy;  Surgeon: Serafina Mitchell, MD;  Location: [place] [facility];  Service: Cardiovascular;  Laterality: Right;  superficial femoral  . ROTATOR CUFF REPAIR     1990    Social History   Social History  . Marital status: Married    Spouse name: N/A  . Number of children:  N/A  . Years of education: N/A   Occupational History  . Not on file.   Social History Main Topics  . Smoking status: Never Smoker  . Smokeless tobacco: Former Systems developer    Quit date: 10/26/1981  . Alcohol use 0.0 oz/week     Comment: occasional drink  . Drug use: No  . Sexual activity: Not on file   Other Topics Concern  . Not on file   Social History Narrative  . No narrative on file    Allergies  Allergen Reactions  . Demerol Anaphylaxis  . Lactose Intolerance (Gi) Anaphylaxis  . Penicillins Anaphylaxis    Has patient had a PCN reaction causing immediate rash, facial/tongue/throat swelling, SOB or lightheadedness with hypotension: Yes Has patient had a PCN reaction causing severe rash involving mucus membranes or skin necrosis: No Has patient had a PCN reaction that required hospitalization: No Has patient had a PCN reaction occurring within the last 10 years: No If all of the above answers are "NO", then may proceed with Cephalosporin use.   . Actos [Pioglitazone] Nausea Only  . Byetta 10 Mcg Pen [Exenatide] Nausea Only  . Glimepiride Other (See Comments)    Edema in ankles   . Nabumetone Nausea Only  . Oxycodone Nausea Only    Current Outpatient Prescriptions on File Prior to Visit  Medication Sig Dispense Refill  . ACCU-CHEK AVIVA PLUS test strip AS  DIRECTED SEVEN TIMES A DAY IN VITRO 90 DAYS  4  . amLODipine (NORVASC) 10 MG tablet Take 10 mg by mouth daily.      Marland Kitchen atorvastatin (LIPITOR) 40 MG tablet Take 40 mg by mouth at bedtime.  5  . benazepril (LOTENSIN) 40 MG tablet Take 40 mg by mouth daily.      . clonazePAM (KLONOPIN) 0.5 MG tablet TAKE 1 TABLET DAILY AS NEEDED FOR SLEEP  5  . clopidogrel (PLAVIX) 75 MG tablet TAKE 1 TABLET BY MOUTH EVERY MORNING WITH BREAKFAST 30 tablet 11  . fluticasone (FLONASE) 50 MCG/ACT nasal spray Place 1 spray into both nostrils at bedtime as needed (before uses CPAP if needed).    . hydrochlorothiazide (HYDRODIURIL) 25 MG tablet  Take 12.5 mg by mouth daily.  1  . ibuprofen (ADVIL,MOTRIN) 200 MG tablet Take 800 mg by mouth daily.    . insulin glargine (LANTUS) 100 UNIT/ML injection Inject 20 Units into the skin at bedtime.     . meloxicam (MOBIC) 15 MG tablet TAKE 1 TABLET BY MOUTH EVERY DAY 30 tablet 2  . metFORMIN (GLUCOPHAGE-XR) 500 MG 24 hr tablet Take 2,000 mg by mouth daily with supper.     . metoprolol succinate (TOPROL-XL) 100 MG 24 hr tablet Take 100 mg by mouth every evening.     Marland Kitchen NOVOLOG FLEXPEN 100 UNIT/ML FlexPen USE 18 UNITS IF BLOOD SUGAR 86 TO 126, THEN 1 UNIT FOR EVERY 20MG /DL ABOVE GOAL.  2  . tamsulosin (FLOMAX) 0.4 MG CAPS capsule TAKE 1 CAPSULE BY MOUTH EVERY DAY AFTER DINNER  5  . temazepam (RESTORIL) 30 MG capsule Take 30 mg by mouth at bedtime.      No current facility-administered medications on file prior to visit.       Physical Examination  Vitals:   10/14/16 1519  BP: (!) 147/89  Pulse: 68  Resp: 20  Temp: (!) 97 F (36.1 C)  TempSrc: Oral  SpO2: 96%  Weight: 239 lb (108.4 kg)  Height: 6' (1.829 m)   Body mass index is 32.41 kg/m.  PHYSICAL EXAMINATION: General: The patient appears his stated age.   HEENT:  No gross abnormalities Pulmonary: Respirations are non-labored, BBS CTAB. Abdomen: Soft and non-tender Musculoskeletal: There are no major deformities.   Neurologic: No focal weakness or paresthesias are detected Skin: There are no ulcer or rashes noted. Psychiatric: The patient has normal affect. Cardiovascular: There is a regular rate and rhythm without significant murmur appreciated.   Vascular: Vessel Right Left  Radial Palpable Palpable  Carotid Palpable, without bruit Palpable, without bruit  Aorta Not palpable N/A  Femoral Palpable Palpable  Popliteal Not palpable Not palpable  PT  Palpable  Palpable  DP  not Palpable  Palpable    Medical Decision Making  Michael [name] is a 66 y.o. male who presents s/p atherectomy with drug coated balloon  angioplasty, right superficial femoral artery on 09-01-16.   Based on his HPI, physical exam results, and non invasive vascular lab results, this patient needs: ABI's and right LE arterial duplex in 3 months, according to post angiography intervention protocol.   Continue graduated walking program.   I discussed in depth with the patient the nature of atherosclerosis, and emphasized the importance of maximal medical management including strict control of blood pressure, blood glucose, and lipid levels, obtaining regular exercise, and cessation of smoking.  The patient is aware that without maximal medical management the underlying atherosclerotic disease process will progress, limiting  the benefit of any interventions. The patient is currently on a statin.  The patient is currently on an anti-platelet.    Thank you for allowing [place] to participate in this patient's care.  Michael [name], Sharmon Leyden, RN, MSN, FNP-C [facility] [place] Office: [phone]  10/14/2016, 3:32 PM  Clinic MD: Bridgett Larsson

## 2016-10-15 NOTE — Addendum Note (Signed)
Addended by: Lianne Cure A on: 10/15/2016 08:39 AM   Modules accepted: Orders

## 2016-11-03 DIAGNOSIS — M5410 Radiculopathy, site unspecified: Secondary | ICD-10-CM | POA: Insufficient documentation

## 2016-11-03 DIAGNOSIS — N50819 Testicular pain, unspecified: Secondary | ICD-10-CM | POA: Diagnosis not present

## 2016-11-03 DIAGNOSIS — R972 Elevated prostate specific antigen [PSA]: Secondary | ICD-10-CM | POA: Diagnosis not present

## 2016-11-16 DIAGNOSIS — Z23 Encounter for immunization: Secondary | ICD-10-CM | POA: Diagnosis not present

## 2016-12-24 DIAGNOSIS — S62001D Unspecified fracture of navicular [scaphoid] bone of right wrist, subsequent encounter for fracture with routine healing: Secondary | ICD-10-CM | POA: Diagnosis not present

## 2016-12-24 DIAGNOSIS — M1811 Unilateral primary osteoarthritis of first carpometacarpal joint, right hand: Secondary | ICD-10-CM | POA: Diagnosis not present

## 2016-12-24 DIAGNOSIS — M25512 Pain in left shoulder: Secondary | ICD-10-CM | POA: Diagnosis not present

## 2016-12-24 DIAGNOSIS — Z4789 Encounter for other orthopedic aftercare: Secondary | ICD-10-CM | POA: Diagnosis not present

## 2017-01-14 DIAGNOSIS — E1151 Type 2 diabetes mellitus with diabetic peripheral angiopathy without gangrene: Secondary | ICD-10-CM | POA: Diagnosis not present

## 2017-01-14 DIAGNOSIS — E230 Hypopituitarism: Secondary | ICD-10-CM | POA: Diagnosis not present

## 2017-01-14 DIAGNOSIS — Z794 Long term (current) use of insulin: Secondary | ICD-10-CM | POA: Diagnosis not present

## 2017-01-18 DIAGNOSIS — M62830 Muscle spasm of back: Secondary | ICD-10-CM | POA: Diagnosis not present

## 2017-01-18 DIAGNOSIS — M5136 Other intervertebral disc degeneration, lumbar region: Secondary | ICD-10-CM | POA: Diagnosis not present

## 2017-01-18 DIAGNOSIS — M545 Low back pain: Secondary | ICD-10-CM | POA: Diagnosis not present

## 2017-01-18 DIAGNOSIS — M9903 Segmental and somatic dysfunction of lumbar region: Secondary | ICD-10-CM | POA: Diagnosis not present

## 2017-01-20 ENCOUNTER — Ambulatory Visit (HOSPITAL_COMMUNITY)
Admission: RE | Admit: 2017-01-20 | Discharge: 2017-01-20 | Disposition: A | Discharge status: Home / Self Care | Payer: Medicare Other | Source: Ambulatory Visit | Attending: Family | Admitting: Family

## 2017-01-20 ENCOUNTER — Ambulatory Visit (INDEPENDENT_AMBULATORY_CARE_PROVIDER_SITE_OTHER): Payer: Medicare Other | Admitting: Family

## 2017-01-20 ENCOUNTER — Encounter: Payer: Self-pay | Admitting: Family

## 2017-01-20 ENCOUNTER — Ambulatory Visit (INDEPENDENT_AMBULATORY_CARE_PROVIDER_SITE_OTHER)
Admission: RE | Admit: 2017-01-20 | Discharge: 2017-01-20 | Disposition: A | Discharge status: Home / Self Care | Payer: Medicare Other | Source: Ambulatory Visit | Attending: Family | Admitting: Family

## 2017-01-20 VITALS — BP 175/88 | HR 76 | Temp 97.4°F | Resp 19 | Wt 241.9 lb

## 2017-01-20 DIAGNOSIS — Z9862 Peripheral vascular angioplasty status: Secondary | ICD-10-CM | POA: Diagnosis not present

## 2017-01-20 DIAGNOSIS — R0989 Other specified symptoms and signs involving the circulatory and respiratory systems: Secondary | ICD-10-CM

## 2017-01-20 DIAGNOSIS — E119 Type 2 diabetes mellitus without complications: Secondary | ICD-10-CM | POA: Diagnosis not present

## 2017-01-20 DIAGNOSIS — Z959 Presence of cardiac and vascular implant and graft, unspecified: Secondary | ICD-10-CM

## 2017-01-20 DIAGNOSIS — Z48812 Encounter for surgical aftercare following surgery on the circulatory system: Secondary | ICD-10-CM | POA: Diagnosis not present

## 2017-01-20 DIAGNOSIS — I10 Essential (primary) hypertension: Secondary | ICD-10-CM | POA: Insufficient documentation

## 2017-01-20 DIAGNOSIS — Z87891 Personal history of nicotine dependence: Secondary | ICD-10-CM

## 2017-01-20 DIAGNOSIS — Z8679 Personal history of other diseases of the circulatory system: Secondary | ICD-10-CM

## 2017-01-20 DIAGNOSIS — E785 Hyperlipidemia, unspecified: Secondary | ICD-10-CM | POA: Insufficient documentation

## 2017-01-20 NOTE — Patient Instructions (Signed)

## 2017-01-20 NOTE — Progress Notes (Signed)
[facility] [place]   CC: Follow up peripheral artery occlusive disease  History of Present Illness Michael [name] is a 66 y.o. male  who is s/p abdominal aortogram with right lower extremity runoff, and atherectomy with drug coated balloon angioplasty of right superficial femoral artery on 09-01-16 by Dr. Trula Slade for instent stenosis. The patient has a history of right superficial femoral artery stenting for claudication.   He returns today for follow up with a duplex of the right leg and ABI.  He is walking a mile daily, his right calf claudication has resolved, except for walking to his mailbox first thing in the morning his right calf bothers him, but does not bother him when he walks a mile.  His right buttock hurts after walking a mile; this started about 2011 or 2012.  He has no left lower extremity claudication.   Arteriogram on 09-01-16:  Findings:              Aortogram:  No significant renal artery stenosis.  The infrarenal abdominal aorta is widely patent.  Bilateral common and external iliac arteries are widely patent.             Right Lower Extremity:  The right common femoral and profunda femoral artery widely patent.  The overlapping stents are visualized within the right superficial femoral artery.  There were several areas of heavily calcified recurrent stenosis approximately 85%.  The popliteal artery is patent throughout its course there is single vessel runoff via the peroneal artery.             Left Lower Extremity:  Not evaluated    He saw a urologist in [facility] for a 4.8 PSA; states no problems, no cancer found.  He denies any known history of stroke or TIA.   Pt states he has known L2-3 HNP, symptoms were left low back and buttock pain, had chiropractic treatment for this and physical therapy, ESI's were offered but pt declined.  He fractured his right wrist and was not able to work out at [employer] until recently.   He states his blood  pressure was 126/60 this morning at a fire station in his neighborhood.   Pt Diabetic: Yes, Pt states his last A1C was 8.7, improved from 9.8, Dr. Buddy Duty is his endocrinologist.   Pt smoker: non-smoker  Pt meds include: Statin :Yes Betablocker: Yes ASA: No Other anticoagulants/antiplatelets: Plavix  Past Medical History:  Diagnosis Date  . Allergy   . Anxiety   . Diabetes mellitus   . Hyperlipidemia   . Hypertension     Social History Social History   Tobacco Use  . Smoking status: Never Smoker  . Smokeless tobacco: Former Network engineer Use Topics  . Alcohol use: Yes    Alcohol/week: 0.0 oz    Comment: occasional drink  . Drug use: No    Family History Family History  Problem Relation Age of Onset  . Hyperlipidemia Mother   . Hypertension Mother   . Heart disease Father   . Deep vein thrombosis Father   . Hyperlipidemia Father   . Hypertension Father   . Heart attack Father   . Peripheral vascular disease Father   . Cancer Sister     Past Surgical History:  Procedure Laterality Date  . ABDOMINAL AORTOGRAM N/A 09/01/2016   Procedure: Abdominal Aortogram;  Surgeon: Serafina Mitchell, MD;  Location: [facility];  Service: Cardiovascular;  Laterality: N/A;  .  ANGIOPLASTY / STENTING FEMORAL  11/05/10   Left SFA stent  . KNEE ARTHROSCOPY    . LOWER EXTREMITY ANGIOGRAPHY N/A 09/01/2016   Procedure: Lower Extremity Angiography;  Surgeon: Serafina Mitchell, MD;  Location: [place] [facility];  Service: Cardiovascular;  Laterality: N/A;  . PERIPHERAL VASCULAR ATHERECTOMY Right 09/01/2016   Procedure: Peripheral Vascular Atherectomy;  Surgeon: Serafina Mitchell, MD;  Location: [place] [facility];  Service: Cardiovascular;  Laterality: Right;  superficial femoral  . ROTATOR CUFF REPAIR     1990    Allergies  Allergen Reactions  . Demerol Anaphylaxis  . Lactose Intolerance (Gi) Anaphylaxis  . Penicillins Anaphylaxis    Has patient had a PCN reaction causing  immediate rash, facial/tongue/throat swelling, SOB or lightheadedness with hypotension: Yes Has patient had a PCN reaction causing severe rash involving mucus membranes or skin necrosis: No Has patient had a PCN reaction that required hospitalization: No Has patient had a PCN reaction occurring within the last 10 years: No If all of the above answers are "NO", then may proceed with Cephalosporin use.   . Actos [Pioglitazone] Nausea Only  . Byetta 10 Mcg Pen [Exenatide] Nausea Only  . Glimepiride Other (See Comments)    Edema in ankles   . Nabumetone Nausea Only  . Oxycodone Nausea Only    Current Outpatient Medications  Medication Sig Dispense Refill  . ACCU-CHEK AVIVA PLUS test strip AS DIRECTED SEVEN TIMES A DAY IN VITRO 90 DAYS  4  . amLODipine (NORVASC) 10 MG tablet Take 10 mg by mouth daily.      Marland Kitchen atorvastatin (LIPITOR) 40 MG tablet Take 40 mg by mouth at bedtime.  5  . benazepril (LOTENSIN) 40 MG tablet Take 40 mg by mouth daily.      . clonazePAM (KLONOPIN) 0.5 MG tablet TAKE 1 TABLET DAILY AS NEEDED FOR SLEEP  5  . clopidogrel (PLAVIX) 75 MG tablet TAKE 1 TABLET BY MOUTH EVERY MORNING WITH BREAKFAST 30 tablet 11  . fluticasone (FLONASE) 50 MCG/ACT nasal spray Place 1 spray into both nostrils at bedtime as needed (before uses CPAP if needed).    . hydrochlorothiazide (HYDRODIURIL) 25 MG tablet Take 12.5 mg by mouth daily.  1  . ibuprofen (ADVIL,MOTRIN) 200 MG tablet Take 800 mg by mouth daily.    . insulin glargine (LANTUS) 100 UNIT/ML injection Inject 20 Units into the skin at bedtime.     . meloxicam (MOBIC) 15 MG tablet TAKE 1 TABLET BY MOUTH EVERY DAY 30 tablet 2  . metFORMIN (GLUCOPHAGE-XR) 500 MG 24 hr tablet Take 2,000 mg by mouth daily with supper.     . metoprolol succinate (TOPROL-XL) 100 MG 24 hr tablet Take 100 mg by mouth every evening.     Marland Kitchen NOVOLOG FLEXPEN 100 UNIT/ML FlexPen USE 18 UNITS IF BLOOD SUGAR 86 TO 126, THEN 1 UNIT FOR EVERY 20MG /DL ABOVE GOAL.  2  .  tamsulosin (FLOMAX) 0.4 MG CAPS capsule TAKE 1 CAPSULE BY MOUTH EVERY DAY AFTER DINNER  5  . temazepam (RESTORIL) 30 MG capsule Take 30 mg by mouth at bedtime.     Marland Kitchen JARDIANCE 10 MG TABS tablet Take 10 mg by mouth daily.  11   No current facility-administered medications for this visit.     ROS: See HPI for pertinent positives and negatives.   Physical Examination  Vitals:   01/20/17 1255 01/20/17 1300  BP: (!) 165/89 (!) 175/88  Pulse: 76   Resp: 19   Temp: (!)  97.4 F (36.3 C)   TempSrc: Oral   SpO2: 98%   Weight: 241 lb 14.4 oz (109.7 kg)    Body mass index is 32.81 kg/m.  General: A&O x 3, WDWN, obese male. Gait: normal Eyes: PERRLA. Pulmonary: Respirations are non labored, CTAB, good air movement Cardiac: Regular rhythm, no detected murmur.         Carotid Bruits Right Left   Negative Negative   Radial pulses are 2+ palpable bilaterally   Adominal aortic pulse is not palpable                         VASCULAR EXAM: Extremities without ischemic changes, without Gangrene; without open wounds.                                                                                                          LE Pulses Right Left       FEMORAL  not palpable  2+ palpable        POPLITEAL  not palpable  3+ palpable       POSTERIOR TIBIAL   Not palpable    3+palpable        DORSALIS PEDIS      ANTERIOR TIBIAL  2+palpable   3+palpable    Abdomen: soft, NT, large reducible, asymptomatic ventral hernia. Skin: no ulcers noted.Psoriais patches on back and thighs.  Musculoskeletal: no muscle wasting or atrophy.  Neurologic: A&O X 3; Appropriate Affect ; SENSATION: normal; MOTOR FUNCTION:  moving all extremities equally, motor strength 5/5 throughout. Speech is fluent/normal. CN 2-12 intact.    ASSESSMENT: Michael [name] is a 66 y.o. male who is s/p atherectomy with drug coated balloon angioplasty, right superficial femoral artery on 09-01-16.  There is a prominent left  popliteal pulse with no left lower extremity claudication, see Plan. He does not seem to have right calf claudication.   DATA  Right LE Arterial Duplex (11/21/180: Irregular plaque seen in the SFA proximal to the stent with no velocity elevation. No significant stenosis seen in the right SFA.  No significant change compared to the exam on 10-14-16.  ABI (Date: 01/20/2017):  R:   ABI: 0.92 (was 0.96 on 10-14-16),   PT: no waveform morphology documented (appears bi or triphasic)  DP: no waveform morphology documented (appears bi or triphasic)   TBI:  0.75   L:   ABI: 1.33 (was 1.2),   PT: no waveform morphology documented (appears bi or triphasic)   DP: no waveform morphology documented (appears bi or triphasic)  TBI: 1.19 Normal and stable ABI and TBI.    PLAN:  Continue graduated walking program and exercising.  Based on the patient's vascular studies and examination, pt will return to clinic in 3 months with ABI's, left popliteal artery duplex,  and right LE arterial duplex.   I discussed in depth with the patient the nature of atherosclerosis, and emphasized the importance of maximal medical management including strict control of blood pressure, blood glucose, and lipid levels, obtaining regular exercise,  and continued cessation of smoking.  The patient is aware that without maximal medical management the underlying atherosclerotic disease process will progress, limiting the benefit of any interventions.  The patient was given information about PAD including signs, symptoms, treatment, what symptoms should prompt the patient to seek immediate medical care, and risk reduction measures to take.  Clemon Chambers, RN, MSN, FNP-C [facility] Phone: [phone]  Clinic MD: Scot Dock  01/20/17 1:04 PM

## 2017-01-25 DIAGNOSIS — M62830 Muscle spasm of back: Secondary | ICD-10-CM | POA: Diagnosis not present

## 2017-01-25 DIAGNOSIS — M5136 Other intervertebral disc degeneration, lumbar region: Secondary | ICD-10-CM | POA: Diagnosis not present

## 2017-01-25 DIAGNOSIS — M9903 Segmental and somatic dysfunction of lumbar region: Secondary | ICD-10-CM | POA: Diagnosis not present

## 2017-01-25 DIAGNOSIS — M545 Low back pain: Secondary | ICD-10-CM | POA: Diagnosis not present

## 2017-01-25 NOTE — Addendum Note (Signed)
Addended by: Lianne Cure A on: 01/25/2017 09:39 AM   Modules accepted: Orders

## 2017-01-28 DIAGNOSIS — E1151 Type 2 diabetes mellitus with diabetic peripheral angiopathy without gangrene: Secondary | ICD-10-CM | POA: Diagnosis not present

## 2017-01-28 DIAGNOSIS — I10 Essential (primary) hypertension: Secondary | ICD-10-CM | POA: Diagnosis not present

## 2017-02-17 ENCOUNTER — Telehealth: Payer: Self-pay | Admitting: *Deleted

## 2017-02-17 DIAGNOSIS — H40013 Open angle with borderline findings, low risk, bilateral: Secondary | ICD-10-CM | POA: Diagnosis not present

## 2017-02-17 DIAGNOSIS — Z961 Presence of intraocular lens: Secondary | ICD-10-CM | POA: Diagnosis not present

## 2017-02-17 DIAGNOSIS — E119 Type 2 diabetes mellitus without complications: Secondary | ICD-10-CM | POA: Diagnosis not present

## 2017-02-17 NOTE — Telephone Encounter (Signed)
Patient called 02/16/17 c/o increasing pain and numbness in rip hip and thigh area and periods of "leg going to sleep" was seen here 1 month ago and was to return in 3 but desires to move this to ASAP due to increase in symptoms. Will have vascular studies done and appt with Vinnie Level. To go to ER for any acute problems or call here if symptoms worsen.

## 2017-02-26 ENCOUNTER — Ambulatory Visit (HOSPITAL_COMMUNITY)
Admission: RE | Admit: 2017-02-26 | Discharge: 2017-02-26 | Disposition: A | Discharge status: Home / Self Care | Payer: Medicare Other | Source: Ambulatory Visit | Attending: Family | Admitting: Family

## 2017-02-26 ENCOUNTER — Encounter (HOSPITAL_COMMUNITY): Payer: Self-pay

## 2017-02-26 ENCOUNTER — Ambulatory Visit (INDEPENDENT_AMBULATORY_CARE_PROVIDER_SITE_OTHER)
Admission: RE | Admit: 2017-02-26 | Discharge: 2017-02-26 | Disposition: A | Discharge status: Home / Self Care | Payer: Medicare Other | Source: Ambulatory Visit | Attending: Family | Admitting: Family

## 2017-02-26 DIAGNOSIS — Z959 Presence of cardiac and vascular implant and graft, unspecified: Secondary | ICD-10-CM

## 2017-02-26 DIAGNOSIS — I10 Essential (primary) hypertension: Secondary | ICD-10-CM | POA: Diagnosis not present

## 2017-02-26 DIAGNOSIS — Z8679 Personal history of other diseases of the circulatory system: Secondary | ICD-10-CM | POA: Insufficient documentation

## 2017-02-26 DIAGNOSIS — R0989 Other specified symptoms and signs involving the circulatory and respiratory systems: Secondary | ICD-10-CM | POA: Diagnosis not present

## 2017-02-26 DIAGNOSIS — E785 Hyperlipidemia, unspecified: Secondary | ICD-10-CM | POA: Diagnosis not present

## 2017-02-26 DIAGNOSIS — Z9862 Peripheral vascular angioplasty status: Secondary | ICD-10-CM | POA: Diagnosis not present

## 2017-02-26 DIAGNOSIS — E119 Type 2 diabetes mellitus without complications: Secondary | ICD-10-CM | POA: Diagnosis not present

## 2017-03-05 ENCOUNTER — Ambulatory Visit (INDEPENDENT_AMBULATORY_CARE_PROVIDER_SITE_OTHER): Payer: Medicare Other | Admitting: Family

## 2017-03-05 ENCOUNTER — Encounter: Payer: Self-pay | Admitting: *Deleted

## 2017-03-05 ENCOUNTER — Other Ambulatory Visit: Payer: Self-pay | Admitting: *Deleted

## 2017-03-05 ENCOUNTER — Encounter: Payer: Self-pay | Admitting: Family

## 2017-03-05 VITALS — BP 132/83 | HR 78 | Temp 97.1°F | Resp 20 | Ht 72.0 in | Wt 237.4 lb

## 2017-03-05 DIAGNOSIS — Z959 Presence of cardiac and vascular implant and graft, unspecified: Secondary | ICD-10-CM

## 2017-03-05 DIAGNOSIS — Z8679 Personal history of other diseases of the circulatory system: Secondary | ICD-10-CM

## 2017-03-05 DIAGNOSIS — Z87891 Personal history of nicotine dependence: Secondary | ICD-10-CM | POA: Diagnosis not present

## 2017-03-05 DIAGNOSIS — Z9862 Peripheral vascular angioplasty status: Secondary | ICD-10-CM | POA: Diagnosis not present

## 2017-03-05 NOTE — Progress Notes (Signed)
[facility] [place]   CC: Follow up peripheral artery occlusive disease  History of Present Illness Michael [name] is a 67 y.o. male who is s/p abdominal aortogram with right lower extremity runoff, and atherectomy with drug coated balloon angioplastyofright superficial femoral artery on 09-01-16 by Dr. Trula Slade for instent stenosis.The patient has a history of right superficial femoral artery stenting for claudication.   He returns today for after patient called 02/16/17 c/o increasing pain and numbness in right hip; right calf tightness started the end of November, and periods of "leg going to sleep" was seen here 1 month prior and was to return in 3 months but desires to move this to ASAP due to increase in symptoms.  After walking 50 feet his right hip will cramp. His right calf will tighten after he sits, relieved by stretching his calf. He denies dyspnea, walks up and down stairs with no hip or calf cramping.   Prior to the above, pt was walking a mile daily, his right calf claudication had resolved, except for walking to his mailbox first thing in the morning his right calf bothers him, but did not bother him when he walked a mile.  His right buttock hurts after walking a mile; this started about 2011 or 2012.   He saw a urologist in [facility] for a 4.8 PSA; states no problems, no cancer found.  He denies any known history of stroke or TIA.   Pt states he has known L2-3 HNP, symptoms were left low back and buttock pain, had chiropractic treatment for this and physical therapy, ESI's were offered but pt declined.  He fractured his right wrist and was not able to work out at [employer] until recently.   Pt Diabetic: Yes, Pt states his last A1C was 8.5, improved from 9.8, Dr. Buddy Duty is his endocrinologist.  Pt smoker: non-smoker  Pt meds include: Statin :Yes Betablocker: Yes ASA: No Other anticoagulants/antiplatelets: Plavix    Past Medical History:   Diagnosis Date  . Allergy   . Anxiety   . Diabetes mellitus   . Hyperlipidemia   . Hypertension     Social History Social History   Tobacco Use  . Smoking status: Never Smoker  . Smokeless tobacco: Former Network engineer Use Topics  . Alcohol use: Yes    Alcohol/week: 0.0 oz    Comment: occasional drink  . Drug use: No    Family History Family History  Problem Relation Age of Onset  . Hyperlipidemia Mother   . Hypertension Mother   . Heart disease Father   . Deep vein thrombosis Father   . Hyperlipidemia Father   . Hypertension Father   . Heart attack Father   . Peripheral vascular disease Father   . Cancer Sister     Past Surgical History:  Procedure Laterality Date  . ABDOMINAL AORTOGRAM N/A 09/01/2016   Procedure: Abdominal Aortogram;  Surgeon: Serafina Mitchell, MD;  Location: [facility];  Service: Cardiovascular;  Laterality: N/A;  . ANGIOPLASTY / STENTING FEMORAL  11/05/10   Left SFA stent  . KNEE ARTHROSCOPY    . LOWER EXTREMITY ANGIOGRAPHY N/A 09/01/2016   Procedure: Lower Extremity Angiography;  Surgeon: Serafina Mitchell, MD;  Location: [facility];  Service: Cardiovascular;  Laterality: N/A;  . PERIPHERAL VASCULAR ATHERECTOMY Right 09/01/2016   Procedure: Peripheral Vascular Atherectomy;  Surgeon: Serafina Mitchell, MD;  Location: [facility] [place];  Service: Cardiovascular;  Laterality:  Right;  superficial femoral  . ROTATOR CUFF REPAIR     1990    Allergies  Allergen Reactions  . Demerol Anaphylaxis  . Lactose Intolerance (Gi) Anaphylaxis  . Penicillins Anaphylaxis    Has patient had a PCN reaction causing immediate rash, facial/tongue/throat swelling, SOB or lightheadedness with hypotension: Yes Has patient had a PCN reaction causing severe rash involving mucus membranes or skin necrosis: No Has patient had a PCN reaction that required hospitalization: No Has patient had a PCN reaction occurring within the last 10 years: No If all of  the above answers are "NO", then may proceed with Cephalosporin use.   . Actos [Pioglitazone] Nausea Only  . Byetta 10 Mcg Pen [Exenatide] Nausea Only  . Glimepiride Other (See Comments)    Edema in ankles   . Nabumetone Nausea Only  . Oxycodone Nausea Only    Current Outpatient Medications  Medication Sig Dispense Refill  . ACCU-CHEK AVIVA PLUS test strip AS DIRECTED SEVEN TIMES A DAY IN VITRO 90 DAYS  4  . amLODipine (NORVASC) 10 MG tablet Take 10 mg by mouth daily.      Marland Kitchen atorvastatin (LIPITOR) 40 MG tablet Take 40 mg by mouth at bedtime.  5  . benazepril (LOTENSIN) 40 MG tablet Take 40 mg by mouth daily.      . clonazePAM (KLONOPIN) 0.5 MG tablet TAKE 1 TABLET DAILY AS NEEDED FOR SLEEP  5  . clopidogrel (PLAVIX) 75 MG tablet TAKE 1 TABLET BY MOUTH EVERY MORNING WITH BREAKFAST 30 tablet 11  . fluticasone (FLONASE) 50 MCG/ACT nasal spray Place 1 spray into both nostrils at bedtime as needed (before uses CPAP if needed).    . hydrochlorothiazide (HYDRODIURIL) 25 MG tablet Take 12.5 mg by mouth daily.  1  . ibuprofen (ADVIL,MOTRIN) 200 MG tablet Take 800 mg by mouth daily.    . insulin glargine (LANTUS) 100 UNIT/ML injection Inject 20 Units into the skin at bedtime.     Marland Kitchen JARDIANCE 10 MG TABS tablet Take 10 mg by mouth daily.  11  . metFORMIN (GLUCOPHAGE-XR) 500 MG 24 hr tablet Take 2,000 mg by mouth daily with supper.     . metoprolol succinate (TOPROL-XL) 100 MG 24 hr tablet Take 100 mg by mouth every evening.     Marland Kitchen NOVOLOG FLEXPEN 100 UNIT/ML FlexPen USE 18 UNITS IF BLOOD SUGAR 86 TO 126, THEN 1 UNIT FOR EVERY 20MG /DL ABOVE GOAL.  2  . tamsulosin (FLOMAX) 0.4 MG CAPS capsule TAKE 1 CAPSULE BY MOUTH EVERY DAY AFTER DINNER  5  . temazepam (RESTORIL) 30 MG capsule Take 30 mg by mouth at bedtime.     . meloxicam (MOBIC) 15 MG tablet TAKE 1 TABLET BY MOUTH EVERY DAY (Patient not taking: Reported on 03/05/2017) 30 tablet 2   No current facility-administered medications for this visit.      ROS: See HPI for pertinent positives and negatives.   Physical Examination  Vitals:   03/05/17 1521  BP: 132/83  Pulse: 78  Resp: 20  Temp: (!) 97.1 F (36.2 C)  TempSrc: Oral  SpO2: 95%  Weight: 237 lb 6.4 oz (107.7 kg)  Height: 6' (1.829 m)   Body mass index is 32.2 kg/m.  General: A&O x 3, WDWN, obese male. Gait: normal Eyes: PERRLA. Pulmonary: Respirations are non labored, CTAB, good air movement Cardiac: Regular rhythm, no detected murmur.         Carotid Bruits Right Left   Negative Negative   Radial pulses  are 2+ palpable bilaterally   Adominal aortic pulse is not palpable                         VASCULAR EXAM: Extremities without ischemic changes, without Gangrene; without open wounds.                                                                                                                                                       LE Pulses Right Left       FEMORAL  not palpable  2+ palpable        POPLITEAL  not palpable  1+ palpable       POSTERIOR TIBIAL   Not palpable    1+palpable        DORSALIS PEDIS      ANTERIOR TIBIAL  not palpable (was 2+ palpable on 01-20-17)   2+palpable    Abdomen: soft, NT, large reducible, asymptomatic ventral hernia. Skin: no ulcers noted.Psoriais patches on back and thighs.  Musculoskeletal: no muscle wasting or atrophy.      Neurologic: A&O X 3; Appropriate Affect ; SENSATION: normal; MOTOR FUNCTION:  moving all extremities equally, motor strength 5/5 throughout. Speech is fluent/normal. CN 2-12 intact.    ASSESSMENT: AHMANI PREHN is a 66 y.o. male who is s/p atherectomy with drug coated balloon angioplasty, right superficial femoral arteryon 09-01-16.   He has had right hip pain with walking since about 2011, this has recently worsened, also recently developed right calf pain after sitting for a short while, alleviated by stretching his right calf.  No signs of ischemia in his feet or  legs.  Right femoral pulse is not palpable today nor at his visit in November 2018.  Arteriogram on 09-01-16 showed bilateral common and external iliac arteries were widely patent.  Last serum creatinine result on file was 0.7 on 09-01-16.   DATA  Right LE Arterial Duplex  (02-26-17): Patent right SFA stent with 50-99% stenosis in the proximal segment (393 cm/s, was 108 cm/s on 01-20-17). Increased in stent stenosis in the proximal segment compared to the exam on 01-20-17.    ABI (Date: 02-26-17):  R:   ABI: 0.78 (was 0.92 on 01-20-17),   PT: waveform morphology not documented  DP: waveform morphology not documented  TBI:  0.54 (was 0.75)  L:   ABI: 1.10 (was 1.33),   PT: waveform morphology not documented  DP: waveform morphology not documented  TBI: 1.16 (was 1.19) Decline in the right ABI from 92% to 78%, also decline in right TBI.  Left ABI and TBI remain normal.      Arteriogram on 09-01-16:  Findings:  Aortogram:No significant renal artery stenosis. The infrarenal abdominal aorta is widely patent. Bilateral common and external iliac arteries are widely patent. Right Lower Extremity:  The right common femoral and profunda femoral artery widely patent. The overlapping stents are visualized within the right superficial femoral artery. There were several areas of heavily calcified recurrent stenosis approximately 85%. The popliteal artery is patent throughout its course there is single vessel runoff via the peroneal artery.             Left Lower Extremity:Not evaluated     PLAN:  Based on the patient's vascular studies and examination, and after discussing with Dr. Bridgett Larsson, pt will be scheduled for aortogram with bilateral run off, possible intervention, by Dr. Trula Slade.  I discussed in depth with the patient the nature of atherosclerosis, and emphasized the importance of maximal medical management including strict control of blood  pressure, blood glucose, and lipid levels, obtaining regular exercise, and continued cessation of smoking.  The patient is aware that without maximal medical management the underlying atherosclerotic disease process will progress, limiting the benefit of any interventions.  The patient was given information about PAD including signs, symptoms, treatment, what symptoms should prompt the patient to seek immediate medical care, and risk reduction measures to take.  Clemon Chambers, RN, MSN, FNP-C [facility] Phone: [phone]  Clinic MD: Chen/Cain  03/05/17 3:47 PM

## 2017-03-05 NOTE — Patient Instructions (Signed)

## 2017-03-05 NOTE — H&P (View-Only) (Signed)
[facility] [place]   CC: Follow up peripheral artery occlusive disease  History of Present Illness Michael [name] is a 67 y.o. male who is s/p abdominal aortogram with right lower extremity runoff, and atherectomy with drug coated balloon angioplastyofright superficial femoral artery on 09-01-16 by Dr. Trula Slade for instent stenosis.The patient has a history of right superficial femoral artery stenting for claudication.   He returns today for after patient called 02/16/17 c/o increasing pain and numbness in right hip; right calf tightness started the end of November, and periods of "leg going to sleep" was seen here 1 month prior and was to return in 3 months but desires to move this to ASAP due to increase in symptoms.  After walking 50 feet his right hip will cramp. His right calf will tighten after he sits, relieved by stretching his calf. He denies dyspnea, walks up and down stairs with no hip or calf cramping.   Prior to the above, pt was walking a mile daily, his right calf claudication had resolved, except for walking to his mailbox first thing in the morning his right calf bothers him, but did not bother him when he walked a mile.  His right buttock hurts after walking a mile; this started about 2011 or 2012.   He saw a urologist in [facility] for a 4.8 PSA; states no problems, no cancer found.  He denies any known history of stroke or TIA.   Pt states he has known L2-3 HNP, symptoms were left low back and buttock pain, had chiropractic treatment for this and physical therapy, ESI's were offered but pt declined.  He fractured his right wrist and was not able to work out at [employer] until recently.   Pt Diabetic: Yes, Pt states his last A1C was 8.5, improved from 9.8, Dr. Buddy Duty is his endocrinologist.  Pt smoker: non-smoker  Pt meds include: Statin :Yes Betablocker: Yes ASA: No Other anticoagulants/antiplatelets: Plavix    Past Medical History:   Diagnosis Date  . Allergy   . Anxiety   . Diabetes mellitus   . Hyperlipidemia   . Hypertension     Social History Social History   Tobacco Use  . Smoking status: Never Smoker  . Smokeless tobacco: Former Network engineer Use Topics  . Alcohol use: Yes    Alcohol/week: 0.0 oz    Comment: occasional drink  . Drug use: No    Family History Family History  Problem Relation Age of Onset  . Hyperlipidemia Mother   . Hypertension Mother   . Heart disease Father   . Deep vein thrombosis Father   . Hyperlipidemia Father   . Hypertension Father   . Heart attack Father   . Peripheral vascular disease Father   . Cancer Sister     Past Surgical History:  Procedure Laterality Date  . ABDOMINAL AORTOGRAM N/A 09/01/2016   Procedure: Abdominal Aortogram;  Surgeon: Serafina Mitchell, MD;  Location: [facility];  Service: Cardiovascular;  Laterality: N/A;  . ANGIOPLASTY / STENTING FEMORAL  11/05/10   Left SFA stent  . KNEE ARTHROSCOPY    . LOWER EXTREMITY ANGIOGRAPHY N/A 09/01/2016   Procedure: Lower Extremity Angiography;  Surgeon: Serafina Mitchell, MD;  Location: [facility];  Service: Cardiovascular;  Laterality: N/A;  . PERIPHERAL VASCULAR ATHERECTOMY Right 09/01/2016   Procedure: Peripheral Vascular Atherectomy;  Surgeon: Serafina Mitchell, MD;  Location: [facility];  Service: Cardiovascular;  Laterality:  Right;  superficial femoral  . ROTATOR CUFF REPAIR     1990    Allergies  Allergen Reactions  . Demerol Anaphylaxis  . Lactose Intolerance (Gi) Anaphylaxis  . Penicillins Anaphylaxis    Has patient had a PCN reaction causing immediate rash, facial/tongue/throat swelling, SOB or lightheadedness with hypotension: Yes Has patient had a PCN reaction causing severe rash involving mucus membranes or skin necrosis: No Has patient had a PCN reaction that required hospitalization: No Has patient had a PCN reaction occurring within the last 10 years: No If all of  the above answers are "NO", then may proceed with Cephalosporin use.   . Actos [Pioglitazone] Nausea Only  . Byetta 10 Mcg Pen [Exenatide] Nausea Only  . Glimepiride Other (See Comments)    Edema in ankles   . Nabumetone Nausea Only  . Oxycodone Nausea Only    Current Outpatient Medications  Medication Sig Dispense Refill  . ACCU-CHEK AVIVA PLUS test strip AS DIRECTED SEVEN TIMES A DAY IN VITRO 90 DAYS  4  . amLODipine (NORVASC) 10 MG tablet Take 10 mg by mouth daily.      Marland Kitchen atorvastatin (LIPITOR) 40 MG tablet Take 40 mg by mouth at bedtime.  5  . benazepril (LOTENSIN) 40 MG tablet Take 40 mg by mouth daily.      . clonazePAM (KLONOPIN) 0.5 MG tablet TAKE 1 TABLET DAILY AS NEEDED FOR SLEEP  5  . clopidogrel (PLAVIX) 75 MG tablet TAKE 1 TABLET BY MOUTH EVERY MORNING WITH BREAKFAST 30 tablet 11  . fluticasone (FLONASE) 50 MCG/ACT nasal spray Place 1 spray into both nostrils at bedtime as needed (before uses CPAP if needed).    . hydrochlorothiazide (HYDRODIURIL) 25 MG tablet Take 12.5 mg by mouth daily.  1  . ibuprofen (ADVIL,MOTRIN) 200 MG tablet Take 800 mg by mouth daily.    . insulin glargine (LANTUS) 100 UNIT/ML injection Inject 20 Units into the skin at bedtime.     Marland Kitchen JARDIANCE 10 MG TABS tablet Take 10 mg by mouth daily.  11  . metFORMIN (GLUCOPHAGE-XR) 500 MG 24 hr tablet Take 2,000 mg by mouth daily with supper.     . metoprolol succinate (TOPROL-XL) 100 MG 24 hr tablet Take 100 mg by mouth every evening.     Marland Kitchen NOVOLOG FLEXPEN 100 UNIT/ML FlexPen USE 18 UNITS IF BLOOD SUGAR 86 TO 126, THEN 1 UNIT FOR EVERY 20MG /DL ABOVE GOAL.  2  . tamsulosin (FLOMAX) 0.4 MG CAPS capsule TAKE 1 CAPSULE BY MOUTH EVERY DAY AFTER DINNER  5  . temazepam (RESTORIL) 30 MG capsule Take 30 mg by mouth at bedtime.     . meloxicam (MOBIC) 15 MG tablet TAKE 1 TABLET BY MOUTH EVERY DAY (Patient not taking: Reported on 03/05/2017) 30 tablet 2   No current facility-administered medications for this visit.      ROS: See HPI for pertinent positives and negatives.   Physical Examination  Vitals:   03/05/17 1521  BP: 132/83  Pulse: 78  Resp: 20  Temp: (!) 97.1 F (36.2 C)  TempSrc: Oral  SpO2: 95%  Weight: 237 lb 6.4 oz (107.7 kg)  Height: 6' (1.829 m)   Body mass index is 32.2 kg/m.  General: A&O x 3, WDWN, obese male. Gait: normal Eyes: PERRLA. Pulmonary: Respirations are non labored, CTAB, good air movement Cardiac: Regular rhythm, no detected murmur.         Carotid Bruits Right Left   Negative Negative   Radial pulses  are 2+ palpable bilaterally   Adominal aortic pulse is not palpable                         VASCULAR EXAM: Extremities without ischemic changes, without Gangrene; without open wounds.                                                                                                                                                       LE Pulses Right Left       FEMORAL  not palpable  2+ palpable        POPLITEAL  not palpable  1+ palpable       POSTERIOR TIBIAL   Not palpable    1+palpable        DORSALIS PEDIS      ANTERIOR TIBIAL  not palpable (was 2+ palpable on 01-20-17)   2+palpable    Abdomen: soft, NT, large reducible, asymptomatic ventral hernia. Skin: no ulcers noted.Psoriais patches on back and thighs.  Musculoskeletal: no muscle wasting or atrophy.      Neurologic: A&O X 3; Appropriate Affect ; SENSATION: normal; MOTOR FUNCTION:  moving all extremities equally, motor strength 5/5 throughout. Speech is fluent/normal. CN 2-12 intact.    ASSESSMENT: Michael [name] is a 67 y.o. male who is s/p atherectomy with drug coated balloon angioplasty, right superficial femoral arteryon 09-01-16.   He has had right hip pain with walking since about 2011, this has recently worsened, also recently developed right calf pain after sitting for a short while, alleviated by stretching his right calf.  No signs of ischemia in his feet or  legs.  Right femoral pulse is not palpable today nor at his visit in November 2018.  Arteriogram on 09-01-16 showed bilateral common and external iliac arteries were widely patent.  Last serum creatinine result on file was 0.7 on 09-01-16.   DATA  Right LE Arterial Duplex  (02-26-17): Patent right SFA stent with 50-99% stenosis in the proximal segment (393 cm/s, was 108 cm/s on 01-20-17). Increased in stent stenosis in the proximal segment compared to the exam on 01-20-17.    ABI (Date: 02-26-17):  R:   ABI: 0.78 (was 0.92 on 01-20-17),   PT: waveform morphology not documented  DP: waveform morphology not documented  TBI:  0.54 (was 0.75)  L:   ABI: 1.10 (was 1.33),   PT: waveform morphology not documented  DP: waveform morphology not documented  TBI: 1.16 (was 1.19) Decline in the right ABI from 92% to 78%, also decline in right TBI.  Left ABI and TBI remain normal.      Arteriogram on 09-01-16:  Findings:  Aortogram:No significant renal artery stenosis. The infrarenal abdominal aorta is widely patent. Bilateral common and external iliac arteries are widely patent. Right Lower Extremity:  The right common femoral and profunda femoral artery widely patent. The overlapping stents are visualized within the right superficial femoral artery. There were several areas of heavily calcified recurrent stenosis approximately 85%. The popliteal artery is patent throughout its course there is single vessel runoff via the peroneal artery.             Left Lower Extremity:Not evaluated     PLAN:  Based on the patient's vascular studies and examination, and after discussing with Dr. Bridgett Larsson, pt will be scheduled for aortogram with bilateral run off, possible intervention, by Dr. Trula Slade.  I discussed in depth with the patient the nature of atherosclerosis, and emphasized the importance of maximal medical management including strict control of blood  pressure, blood glucose, and lipid levels, obtaining regular exercise, and continued cessation of smoking.  The patient is aware that without maximal medical management the underlying atherosclerotic disease process will progress, limiting the benefit of any interventions.  The patient was given information about PAD including signs, symptoms, treatment, what symptoms should prompt the patient to seek immediate medical care, and risk reduction measures to take.  Clemon Chambers, RN, MSN, FNP-C [facility] Phone: [phone]  Clinic MD: Chen/Cain  03/05/17 3:47 PM

## 2017-03-16 ENCOUNTER — Encounter (HOSPITAL_COMMUNITY): Payer: Self-pay | Admitting: Surgery

## 2017-03-16 ENCOUNTER — Telehealth: Payer: Self-pay | Admitting: Surgery

## 2017-03-16 ENCOUNTER — Ambulatory Visit (HOSPITAL_COMMUNITY)
Admission: RE | Admit: 2017-03-16 | Discharge: 2017-03-16 | Disposition: A | Discharge status: Home / Self Care | Payer: Medicare Other | Source: Ambulatory Visit | Attending: Surgery | Admitting: Surgery

## 2017-03-16 ENCOUNTER — Encounter (HOSPITAL_COMMUNITY)
Admission: RE | Disposition: A | Discharge status: Home / Self Care | Payer: Self-pay | Source: Ambulatory Visit | Attending: Surgery

## 2017-03-16 DIAGNOSIS — Z79899 Other long term (current) drug therapy: Secondary | ICD-10-CM | POA: Insufficient documentation

## 2017-03-16 DIAGNOSIS — I10 Essential (primary) hypertension: Secondary | ICD-10-CM | POA: Insufficient documentation

## 2017-03-16 DIAGNOSIS — Z880 Allergy status to penicillin: Secondary | ICD-10-CM | POA: Insufficient documentation

## 2017-03-16 DIAGNOSIS — Z7902 Long term (current) use of antithrombotics/antiplatelets: Secondary | ICD-10-CM | POA: Insufficient documentation

## 2017-03-16 DIAGNOSIS — E1151 Type 2 diabetes mellitus with diabetic peripheral angiopathy without gangrene: Secondary | ICD-10-CM | POA: Insufficient documentation

## 2017-03-16 DIAGNOSIS — F419 Anxiety disorder, unspecified: Secondary | ICD-10-CM | POA: Insufficient documentation

## 2017-03-16 DIAGNOSIS — Y838 Other surgical procedures as the cause of abnormal reaction of the patient, or of later complication, without mention of misadventure at the time of the procedure: Secondary | ICD-10-CM | POA: Diagnosis not present

## 2017-03-16 DIAGNOSIS — T82856A Stenosis of peripheral vascular stent, initial encounter: Secondary | ICD-10-CM

## 2017-03-16 DIAGNOSIS — Z7983 Long term (current) use of bisphosphonates: Secondary | ICD-10-CM | POA: Diagnosis not present

## 2017-03-16 DIAGNOSIS — E785 Hyperlipidemia, unspecified: Secondary | ICD-10-CM | POA: Insufficient documentation

## 2017-03-16 DIAGNOSIS — Z794 Long term (current) use of insulin: Secondary | ICD-10-CM | POA: Diagnosis not present

## 2017-03-16 HISTORY — PX: PERIPHERAL VASCULAR INTERVENTION: CATH118257

## 2017-03-16 HISTORY — PX: ABDOMINAL AORTOGRAM W/LOWER EXTREMITY: CATH118223

## 2017-03-16 LAB — POCT I-STAT, CHEM 8
BUN: 21 mg/dL — ABNORMAL HIGH (ref 6–20)
CALCIUM ION: 1.12 mmol/L — AB (ref 1.15–1.40)
Chloride: 104 mmol/L (ref 101–111)
Creatinine, Ser: 0.8 mg/dL (ref 0.61–1.24)
Glucose, Bld: 203 mg/dL — ABNORMAL HIGH (ref 65–99)
HCT: 43 % (ref 39.0–52.0)
Hemoglobin: 14.6 g/dL (ref 13.0–17.0)
Potassium: 3.5 mmol/L (ref 3.5–5.1)
SODIUM: 140 mmol/L (ref 135–145)
TCO2: 25 mmol/L (ref 22–32)

## 2017-03-16 LAB — GLUCOSE, CAPILLARY
GLUCOSE-CAPILLARY: 180 mg/dL — AB (ref 65–99)
Glucose-Capillary: 243 mg/dL — ABNORMAL HIGH (ref 65–99)

## 2017-03-16 LAB — POCT ACTIVATED CLOTTING TIME
ACTIVATED CLOTTING TIME: 235 s
Activated Clotting Time: 230 seconds
Activated Clotting Time: 175 seconds

## 2017-03-16 SURGERY — ABDOMINAL AORTOGRAM W/LOWER EXTREMITY
Anesthesia: LOCAL | Laterality: Right

## 2017-03-16 MED ORDER — DIAZEPAM 5 MG PO TABS
5.0000 mg | ORAL_TABLET | Freq: Once | ORAL | Status: AC
  Administered 2017-03-16: 5 mg via ORAL

## 2017-03-16 MED ORDER — FENTANYL CITRATE (PF) 100 MCG/2ML IJ SOLN
INTRAMUSCULAR | Status: AC
  Filled 2017-03-16: qty 2

## 2017-03-16 MED ORDER — MIDAZOLAM HCL 2 MG/2ML IJ SOLN
INTRAMUSCULAR | Status: AC
  Filled 2017-03-16: qty 2

## 2017-03-16 MED ORDER — HEPARIN SODIUM (PORCINE) 1000 UNIT/ML IJ SOLN
INTRAMUSCULAR | Status: AC
  Filled 2017-03-16: qty 1

## 2017-03-16 MED ORDER — HEPARIN (PORCINE) IN NACL 2-0.9 UNIT/ML-% IJ SOLN
INTRAMUSCULAR | Status: AC
  Filled 2017-03-16: qty 1000

## 2017-03-16 MED ORDER — HYDRALAZINE HCL 20 MG/ML IJ SOLN: 5.0000 mg | INTRAMUSCULAR | Status: DC | PRN

## 2017-03-16 MED ORDER — LABETALOL HCL 5 MG/ML IV SOLN: 10.0000 mg | INTRAVENOUS | Status: DC | PRN

## 2017-03-16 MED ORDER — HEPARIN SODIUM (PORCINE) 1000 UNIT/ML IJ SOLN
INTRAMUSCULAR | Status: DC | PRN
  Administered 2017-03-16: 1000 [IU] via INTRAVENOUS
  Administered 2017-03-16: 10000 [IU] via INTRAVENOUS

## 2017-03-16 MED ORDER — SODIUM CHLORIDE 0.9 % IV SOLN: INTRAVENOUS | Status: AC

## 2017-03-16 MED ORDER — MORPHINE SULFATE (PF) 10 MG/ML IV SOLN: 2.0000 mg | INTRAVENOUS | Status: DC | PRN

## 2017-03-16 MED ORDER — SODIUM CHLORIDE 0.9 % IV SOLN: 250.0000 mL | INTRAVENOUS | Status: DC | PRN

## 2017-03-16 MED ORDER — FENTANYL CITRATE (PF) 100 MCG/2ML IJ SOLN
INTRAMUSCULAR | Status: DC | PRN
  Administered 2017-03-16: 25 ug via INTRAVENOUS
  Administered 2017-03-16 (×2): 50 ug via INTRAVENOUS
  Administered 2017-03-16 (×2): 25 ug via INTRAVENOUS
  Administered 2017-03-16: 50 ug via INTRAVENOUS
  Administered 2017-03-16: 25 ug via INTRAVENOUS

## 2017-03-16 MED ORDER — HEPARIN (PORCINE) IN NACL 2-0.9 UNIT/ML-% IJ SOLN
INTRAMUSCULAR | Status: AC | PRN
  Administered 2017-03-16: 1000 mL via INTRA_ARTERIAL

## 2017-03-16 MED ORDER — LIDOCAINE HCL (PF) 1 % IJ SOLN
INTRAMUSCULAR | Status: DC | PRN
  Administered 2017-03-16: 20 mL

## 2017-03-16 MED ORDER — DIAZEPAM 5 MG PO TABS
ORAL_TABLET | ORAL | Status: AC
  Filled 2017-03-16: qty 1

## 2017-03-16 MED ORDER — LIDOCAINE HCL (PF) 1 % IJ SOLN
INTRAMUSCULAR | Status: AC
  Filled 2017-03-16: qty 30

## 2017-03-16 MED ORDER — MIDAZOLAM HCL 2 MG/2ML IJ SOLN
INTRAMUSCULAR | Status: DC | PRN
  Administered 2017-03-16 (×5): 1 mg via INTRAVENOUS
  Administered 2017-03-16: 2 mg via INTRAVENOUS
  Administered 2017-03-16: 1 mg via INTRAVENOUS

## 2017-03-16 MED ORDER — SODIUM CHLORIDE 0.9% FLUSH: 3.0000 mL | INTRAVENOUS | Status: DC | PRN

## 2017-03-16 MED ORDER — SODIUM CHLORIDE 0.9 % IV SOLN
INTRAVENOUS | Status: DC
  Administered 2017-03-16: 07:00:00 via INTRAVENOUS

## 2017-03-16 MED ORDER — IODIXANOL 320 MG/ML IV SOLN
INTRAVENOUS | Status: DC | PRN
  Administered 2017-03-16: 140 mL via INTRA_ARTERIAL

## 2017-03-16 MED ORDER — SODIUM CHLORIDE 0.9% FLUSH: 3.0000 mL | Freq: Two times a day (BID) | INTRAVENOUS | Status: DC

## 2017-03-16 SURGICAL SUPPLY — 26 items
BALLN MUSTANG 6X120X135 (BALLOONS) ×6
BALLN STERLING OTW 6X100X135 (BALLOONS) ×6
BALLOON MUSTANG 6X120X135 (BALLOONS) IMPLANT
BALLOON STERLING OTW 6X100X135 (BALLOONS) IMPLANT
CATH OMNI FLUSH 5F 65CM (CATHETERS) ×1 IMPLANT
CATH QUICKCROSS .035X135CM (MICROCATHETER) ×1 IMPLANT
DEVICE TORQUE H2O (MISCELLANEOUS) ×1 IMPLANT
GUIDEWIRE ANGLED .035X260CM (WIRE) ×1 IMPLANT
KIT ENCORE 26 ADVANTAGE (KITS) ×1 IMPLANT
KIT MICROINTRODUCER STIFF 5F (SHEATH) ×1 IMPLANT
KIT PV (KITS) ×3 IMPLANT
SHEATH PINNACLE 5F 10CM (SHEATH) ×1 IMPLANT
SHEATH PINNACLE ST 6F 45CM (SHEATH) ×1 IMPLANT
SHEATH PINNACLE ST 7F 45CM (SHEATH) ×1 IMPLANT
SHIELD RADPAD SCOOP 12X17 (MISCELLANEOUS) ×1 IMPLANT
STENT VIABAHN 6X150X120 (Permanent Stent) ×1 IMPLANT
STENT VIABAHN 6X50X120 (Permanent Stent) ×1 IMPLANT
SYR MEDRAD MARK V 150ML (SYRINGE) ×3 IMPLANT
TAPE VIPERTRACK RADIOPAQ (MISCELLANEOUS) IMPLANT
TAPE VIPERTRACK RADIOPAQUE (MISCELLANEOUS) ×3
TRANSDUCER W/STOPCOCK (MISCELLANEOUS) ×3 IMPLANT
TRAY PV CATH (CUSTOM PROCEDURE TRAY) ×3 IMPLANT
WIRE BENTSON .035X145CM (WIRE) ×1 IMPLANT
WIRE HI TORQ VERSACORE J 260CM (WIRE) ×1 IMPLANT
WIRE ROSEN-J .035X180CM (WIRE) ×1 IMPLANT
WIRE SPARTACORE .014X300CM (WIRE) ×1 IMPLANT

## 2017-03-16 NOTE — Interval H&P Note (Signed)
History and Physical Interval Note:  03/16/2017 8:06 AM  [name]  has presented today for surgery, with the diagnosis of pvd w/ claudication  The various methods of treatment have been discussed with the patient and family. After consideration of risks, benefits and other options for treatment, the patient has consented to  Procedure(s): ABDOMINAL AORTOGRAM W/LOWER EXTREMITY (N/A) as a surgical intervention .  The patient's history has been reviewed, patient examined, no change in status, stable for surgery.  I have reviewed the patient's chart and labs.  Questions were answered to the patient's satisfaction.     Michael [name]

## 2017-03-16 NOTE — Op Note (Signed)
Patient name: Michael [name] MRN: [id number] DOB: 08-25-1950 Sex: male  03/16/2017 Pre-operative Diagnosis: In stent stenosis Post-operative diagnosis:  Same Surgeon:  Annamarie Major Procedure Performed:  1.  Ultrasound-guided access, left femoral artery  2.  Abdominal aortogram  3.  Right lower extremity runoff  4.  Stent, right superficial femoral artery  5.  Conscious sedation (89 minutes)     Indications: The patient has a history of overlapping 6 mm Cook [place]ilver stents placed in 2012.  He had a recurrence in the summer 2018 and underwent a drug-coated balloon angioplasty.  I tried to advance a jetstream atherectomy device across the distal stenosis but was unsuccessful.  He has had a repeat recurrence and is here today for intervention.  Procedure:  The patient was identified in the holding area and taken to room 8.  The patient was then placed supine on the table and prepped and draped in the usual sterile fashion.  A time out was called.  Conscious sedation was administered with the use of IV fentanyl and Versed under continuous physician and nurse monitoring.  Heart rate, blood pressure, and oxygen saturations were continuously monitored.  Ultrasound was used to evaluate the left common femoral artery.  It was patent .  A digital ultrasound image was acquired.  A micropuncture needle was used to access the left common femoral artery under ultrasound guidance.  An 018 wire was advanced without resistance and a micropuncture sheath was placed.  The 018 wire was removed and a benson wire was placed.  The micropuncture sheath was exchanged for a 5 french sheath.  An omniflush catheter was advanced over the wire to the level of L-1.  An abdominal angiogram was obtained.  Next, using the omniflush catheter and a benson wire, the aortic bifurcation was crossed and the catheter was placed into theright external iliac artery and right runoff was obtained.    Findings:   Aortogram: No  significant renal artery stenosis.  No significant aortic stenosis.  No significant common or external iliac stenosis bilaterally.  Right Lower Extremity: The right common femoral and superficial femoral artery are patent throughout their course.  The right superficial femoral artery is patent however within the stents there are 2 focal areas of greater than 90% stenosis which are heavily calcified.  The above-knee popliteal artery is patent throughout its course as is the below-knee popliteal artery.  The peroneal and posterior tibial artery are patent.  The peroneal is the dominant runoff and helps reconstitute the posterior tibial which crosses the ankle.  Left Lower Extremity: Not evaluated  Intervention: After the above images were acquired the decision was made to proceed with intervention.  Over a Rosen wire, a 7 French 45 cm sheath was advanced into the right external iliac artery.  Next, the patient was fully heparinized.  Heparin levels were monitored throughout the case with a CT.  Using a 035 Glidewire and quick cross catheter, the lesions within the stents of the superficial femoral artery were successfully crossed.  A contrast injection was performed with the quick cross catheter in the popliteal artery, confirming successful crossing of the lesion.  A Woolley wire was then placed.  I then performed balloon angioplasty within the stent using a 6 x 150 Mustang balloon.  I had significant difficulty getting the balloon across the distal lesion.  The balloon ruptured on 2 separate occasions.  Follow-up imaging revealed a result I was not happy with and I felt the best option  at this time was to place a covered Viabahn Stents.  I first used a 6 x 150 and inserted this over a 014 Sparta core wire.  I could not advance the stent across the distal lesion .  I therefore elected to deployed the 6 x 150 stent beginning in the more proximal portion of the previously placed stents.  I then used a 6 x 100  Sterling balloon to mold the stent.  I then selected a second Viabahn 6 x 50 and with significant difficulty I was able to advance across the distal lesion.  It was deployed and then molded with a 6 mm balloon.  A follow-up imaging study was then performed which showed successful resolution of the stenosis.  The runoff was unchanged.  Catheters and wires were removed.  Patient be taken to the holding area for sheath pull once his coagulation profile corrects.  The sheath was withdrawn the left external iliac artery.  Impression:  #1 high-grade, greater than 80% tandem stenosis within previously deployed stents.  Primary balloon angioplasty was performed with suboptimal result and therefore 6 mm overlapping Viabahn stents were deployedwith no residual stenosis  #2 patient does not do well with sedation because of shoulder pain.  His procedure in the hybrid operating room under general anesthesia.   Theotis Burrow, M.D. [facility] [place] Office: [phone] Pager:  [phone]

## 2017-03-16 NOTE — Telephone Encounter (Signed)
-----   Message from Mena Goes, RN sent at 03/16/2017  9:54 AM EST ----- Regarding: 6 months    ----- Message ----- From: Serafina Mitchell, MD Sent: 03/16/2017   9:46 AM To: V[facility] Charge Pool  03-16-2017:  Surgeon:  Annamarie Major Procedure Performed:  1.  Ultrasound-guided access, left femoral artery  2.  Abdominal aortogram  3.  Right lower extremity runoff  4.  Stent, right superficial femoral artery  5.  Conscious sedation (89 minutes)  F/u 6 months with suzanne and abi with L LE duplex

## 2017-03-16 NOTE — Progress Notes (Signed)
Site area: left groin fa sheath Site Prior to Removal:  Level 0 Pressure Applied For: 20 minutes Manual:   yes Patient Status During Pull:  stable Post Pull Site:  Level 0 Post Pull Instructions Given:  yes Post Pull Pulses Present: palpable Dressing Applied:  Gauze and tegaderm Bedrest begins @ 1120 Comments:

## 2017-03-16 NOTE — Telephone Encounter (Signed)
Sched appt 09/13/17; labs at 2:00 and NP at 3:15. Mailed appt letter.

## 2017-03-16 NOTE — Progress Notes (Signed)
Client states "I can't stay in this bed any longer and I need for you to call the Dr for some relaxing medication"; client's tongue noted to be bleeding and client rinsed mouth with ice water and no further bleeding noted. Dr Trula Slade notified and no new orders noted

## 2017-03-16 NOTE — Discharge Instructions (Signed)

## 2017-03-17 ENCOUNTER — Telehealth: Payer: Self-pay

## 2017-03-17 ENCOUNTER — Other Ambulatory Visit: Payer: Self-pay

## 2017-03-17 DIAGNOSIS — I739 Peripheral vascular disease, unspecified: Secondary | ICD-10-CM

## 2017-03-17 NOTE — Telephone Encounter (Signed)
Returned patient call. He is 1 day status post aortogram with stent placement. He says his thigh is a little more sore than last time. Insertion site looks good and there is no swelling or redness. Assured patient that soreness is normal but to watch and if it worsens or he has swelling to call us back.

## 2017-03-31 DIAGNOSIS — E782 Mixed hyperlipidemia: Secondary | ICD-10-CM | POA: Diagnosis not present

## 2017-03-31 DIAGNOSIS — R972 Elevated prostate specific antigen [PSA]: Secondary | ICD-10-CM | POA: Diagnosis not present

## 2017-03-31 DIAGNOSIS — G479 Sleep disorder, unspecified: Secondary | ICD-10-CM | POA: Diagnosis not present

## 2017-03-31 DIAGNOSIS — I10 Essential (primary) hypertension: Secondary | ICD-10-CM | POA: Diagnosis not present

## 2017-03-31 DIAGNOSIS — R0981 Nasal congestion: Secondary | ICD-10-CM | POA: Diagnosis not present

## 2017-04-13 DIAGNOSIS — R972 Elevated prostate specific antigen [PSA]: Secondary | ICD-10-CM | POA: Diagnosis not present

## 2017-04-18 ENCOUNTER — Other Ambulatory Visit: Payer: Self-pay | Admitting: Interventional Cardiology

## 2017-04-19 NOTE — Telephone Encounter (Signed)
Medication Detail    Disp Refills Start End   clopidogrel (PLAVIX) 75 MG tablet 30 tablet 11 06/02/2016    Sig: TAKE 1 TABLET BY MOUTH EVERY MORNING WITH BREAKFAST   Sent to pharmacy as: clopidogrel (PLAVIX) 75 MG tablet   E-Prescribing Status: Receipt confirmed by pharmacy (06/02/2016 3:12 PM EDT)   Pharmacy   [id number] - [place], [place] [address]

## 2017-04-20 ENCOUNTER — Other Ambulatory Visit: Payer: Self-pay | Admitting: *Deleted

## 2017-04-20 DIAGNOSIS — I739 Peripheral vascular disease, unspecified: Secondary | ICD-10-CM

## 2017-04-20 MED ORDER — CLOPIDOGREL BISULFATE 75 MG PO TABS: 75.0000 mg | ORAL_TABLET | Freq: Every day | ORAL | 11 refills | Status: DC

## 2017-05-05 DIAGNOSIS — Z79899 Other long term (current) drug therapy: Secondary | ICD-10-CM | POA: Diagnosis not present

## 2017-05-05 DIAGNOSIS — E1151 Type 2 diabetes mellitus with diabetic peripheral angiopathy without gangrene: Secondary | ICD-10-CM | POA: Diagnosis not present

## 2017-05-05 DIAGNOSIS — Z5181 Encounter for therapeutic drug level monitoring: Secondary | ICD-10-CM | POA: Diagnosis not present

## 2017-05-05 DIAGNOSIS — E230 Hypopituitarism: Secondary | ICD-10-CM | POA: Diagnosis not present

## 2017-05-05 DIAGNOSIS — Z794 Long term (current) use of insulin: Secondary | ICD-10-CM | POA: Diagnosis not present

## 2017-05-18 DIAGNOSIS — J069 Acute upper respiratory infection, unspecified: Secondary | ICD-10-CM | POA: Diagnosis not present

## 2017-05-21 ENCOUNTER — Encounter (HOSPITAL_COMMUNITY): Payer: Self-pay

## 2017-05-21 ENCOUNTER — Ambulatory Visit: Payer: Self-pay | Admitting: Family

## 2017-05-21 ENCOUNTER — Other Ambulatory Visit (HOSPITAL_COMMUNITY): Payer: Self-pay

## 2017-05-24 DIAGNOSIS — J019 Acute sinusitis, unspecified: Secondary | ICD-10-CM | POA: Diagnosis not present

## 2017-05-24 DIAGNOSIS — J069 Acute upper respiratory infection, unspecified: Secondary | ICD-10-CM | POA: Diagnosis not present

## 2017-05-24 DIAGNOSIS — R490 Dysphonia: Secondary | ICD-10-CM | POA: Diagnosis not present

## 2017-05-31 DIAGNOSIS — Z794 Long term (current) use of insulin: Secondary | ICD-10-CM | POA: Diagnosis not present

## 2017-05-31 DIAGNOSIS — E119 Type 2 diabetes mellitus without complications: Secondary | ICD-10-CM | POA: Diagnosis not present

## 2017-06-25 DIAGNOSIS — L400 Psoriasis vulgaris: Secondary | ICD-10-CM | POA: Diagnosis not present

## 2017-06-25 DIAGNOSIS — L814 Other melanin hyperpigmentation: Secondary | ICD-10-CM | POA: Diagnosis not present

## 2017-06-25 DIAGNOSIS — D225 Melanocytic nevi of trunk: Secondary | ICD-10-CM | POA: Diagnosis not present

## 2017-06-25 DIAGNOSIS — L821 Other seborrheic keratosis: Secondary | ICD-10-CM | POA: Diagnosis not present

## 2017-06-25 DIAGNOSIS — L409 Psoriasis, unspecified: Secondary | ICD-10-CM | POA: Diagnosis not present

## 2017-06-25 DIAGNOSIS — D692 Other nonthrombocytopenic purpura: Secondary | ICD-10-CM | POA: Diagnosis not present

## 2017-06-25 DIAGNOSIS — D485 Neoplasm of uncertain behavior of skin: Secondary | ICD-10-CM | POA: Diagnosis not present

## 2017-06-29 DIAGNOSIS — J320 Chronic maxillary sinusitis: Secondary | ICD-10-CM | POA: Diagnosis not present

## 2017-07-22 DIAGNOSIS — M19041 Primary osteoarthritis, right hand: Secondary | ICD-10-CM | POA: Insufficient documentation

## 2017-07-30 ENCOUNTER — Other Ambulatory Visit: Payer: Self-pay | Admitting: Podiatry

## 2017-08-17 DIAGNOSIS — E230 Hypopituitarism: Secondary | ICD-10-CM | POA: Diagnosis not present

## 2017-08-17 DIAGNOSIS — Z5181 Encounter for therapeutic drug level monitoring: Secondary | ICD-10-CM | POA: Diagnosis not present

## 2017-08-17 DIAGNOSIS — Z794 Long term (current) use of insulin: Secondary | ICD-10-CM | POA: Diagnosis not present

## 2017-08-17 DIAGNOSIS — E1151 Type 2 diabetes mellitus with diabetic peripheral angiopathy without gangrene: Secondary | ICD-10-CM | POA: Diagnosis not present

## 2017-09-13 ENCOUNTER — Ambulatory Visit (INDEPENDENT_AMBULATORY_CARE_PROVIDER_SITE_OTHER)
Admission: RE | Admit: 2017-09-13 | Discharge: 2017-09-13 | Disposition: A | Discharge status: Home / Self Care | Payer: Medicare Other | Source: Ambulatory Visit | Attending: Surgery | Admitting: Surgery

## 2017-09-13 ENCOUNTER — Encounter: Payer: Self-pay | Admitting: Surgery

## 2017-09-13 ENCOUNTER — Ambulatory Visit (HOSPITAL_COMMUNITY)
Admission: RE | Admit: 2017-09-13 | Discharge: 2017-09-13 | Disposition: A | Discharge status: Home / Self Care | Payer: Medicare Other | Source: Ambulatory Visit | Attending: Surgery | Admitting: Surgery

## 2017-09-13 ENCOUNTER — Other Ambulatory Visit: Payer: Self-pay

## 2017-09-13 ENCOUNTER — Ambulatory Visit (INDEPENDENT_AMBULATORY_CARE_PROVIDER_SITE_OTHER): Payer: Medicare Other | Admitting: Surgery

## 2017-09-13 VITALS — BP 127/83 | HR 75 | Temp 97.7°F | Resp 16 | Ht 71.0 in | Wt 235.0 lb

## 2017-09-13 DIAGNOSIS — I739 Peripheral vascular disease, unspecified: Secondary | ICD-10-CM

## 2017-09-13 DIAGNOSIS — I10 Essential (primary) hypertension: Secondary | ICD-10-CM | POA: Insufficient documentation

## 2017-09-13 NOTE — Progress Notes (Signed)
[facility] [place]  Patient name: Michael [name] MRN: [id number] DOB: [birth year] Sex: male   REASON FOR VISIT:    Follow up  HISOTRY OF PRESENT ILLNESS:     The patient is back today for followup. He Is a 66 year old male patient who was initially seen by Dr. Kellie Simmering for right leg claudication. I subsequently placed a right superficial femoral artery stent in September 2012. On 09/01/2016 he underwent atherectomy with drug-coated balloon angioplasty for in-stent stenosis within the right superficial femoral artery.  On March 16, 2017 he underwent stenting of his right superficial femoral artery for in-stent stenosis, after suboptimal results with angioplasty.  46mm overlapping Viabahn stents were placed.  He has no complaints today.   PAST MEDICAL HISTORY:   Past Medical History:  Diagnosis Date  . Allergy   . Anxiety   . Diabetes mellitus   . Hyperlipidemia   . Hypertension      FAMILY HISTORY:   Family History  Problem Relation Age of Onset  . Hyperlipidemia Mother   . Hypertension Mother   . Heart disease Father   . Deep vein thrombosis Father   . Hyperlipidemia Father   . Hypertension Father   . Heart attack Father   . Peripheral vascular disease Father   . Cancer Sister     SOCIAL HISTORY:   Social History   Tobacco Use  . Smoking status: Never Smoker  . Smokeless tobacco: Former Network engineer Use Topics  . Alcohol use: Yes    Comment: occasional drink     ALLERGIES:   Allergies  Allergen Reactions  . Demerol Anaphylaxis  . Lactose Intolerance (Gi) Anaphylaxis  . Penicillins Anaphylaxis    Has patient had a PCN reaction causing immediate rash, facial/tongue/throat swelling, SOB or lightheadedness with hypotension: Yes Has patient had a PCN reaction causing severe rash involving mucus membranes or skin necrosis: No Has patient had a PCN reaction that required hospitalization:  No Has patient had a PCN reaction occurring within the last 10 years: No If all of the above answers are "NO", then may proceed with Cephalosporin use.   . Actos [Pioglitazone] Nausea Only  . Byetta 10 Mcg Pen [Exenatide] Nausea Only  . Glimepiride Other (See Comments)    Edema in ankles   . Nabumetone Nausea Only  . Oxycodone Nausea Only     CURRENT MEDICATIONS:   Current Outpatient Medications  Medication Sig Dispense Refill  . ACCU-CHEK AVIVA PLUS test strip AS DIRECTED SEVEN TIMES A DAY IN VITRO 90 DAYS  4  . amLODipine (NORVASC) 10 MG tablet Take 10 mg by mouth daily.      Marland Kitchen atorvastatin (LIPITOR) 40 MG tablet Take 40 mg by mouth at bedtime.  5  . benazepril (LOTENSIN) 40 MG tablet Take 40 mg by mouth daily.      . clonazePAM (KLONOPIN) 0.5 MG tablet TAKE HALF TABLET DAILY AS NEEDED FOR SLEEP  5  . clopidogrel (PLAVIX) 75 MG tablet Take 1 tablet (75 mg total) by mouth daily with breakfast. 30 tablet 11  . empagliflozin (JARDIANCE) 25 MG TABS tablet Take 25 mg by mouth daily.    . fexofenadine (ALLEGRA) 180 MG tablet Take 180 mg by mouth daily.    . fluticasone (FLONASE) 50 MCG/ACT nasal spray Place 2 sprays into both nostrils at bedtime as needed (before uses CPAP if needed).     . Homeopathic Products (PSORIASIS/ECZEMA RELIEF EX) Apply 1 application topically daily.    Marland Kitchen  hydrochlorothiazide (HYDRODIURIL) 25 MG tablet Take 12.5 mg by mouth daily.  1  . ibuprofen (ADVIL,MOTRIN) 200 MG tablet Take 800 mg by mouth daily as needed for mild pain.     Marland Kitchen insulin glargine (LANTUS) 100 UNIT/ML injection Inject 24 Units into the skin at bedtime.     . metFORMIN (GLUCOPHAGE-XR) 500 MG 24 hr tablet Take 2,000 mg by mouth daily with supper.     . metoprolol succinate (TOPROL-XL) 100 MG 24 hr tablet Take 100 mg by mouth daily.     Marland Kitchen NOVOLOG FLEXPEN 100 UNIT/ML FlexPen USE 18 UNITS IF BLOOD SUGAR 86 TO 126, THEN 1 UNIT FOR EVERY 20MG /DL ABOVE GOAL. 3 TIMES DAILY WITH MEALS  2  . tamsulosin  (FLOMAX) 0.4 MG CAPS capsule TAKE 1 CAPSULE BY MOUTH EVERY DAY AFTER DINNER  5   No current facility-administered medications for this visit.     REVIEW OF SYSTEMS:   [X]  denotes positive finding, [ ]  denotes negative finding Cardiac  Comments:  Chest pain or chest pressure:    Shortness of breath upon exertion:    Short of breath when lying flat:    Irregular heart rhythm:        Vascular    Pain in calf, thigh, or hip brought on by ambulation:    Pain in feet at night that wakes you up from your sleep:     Blood clot in your veins:    Leg swelling:         Pulmonary    Oxygen at home:    Productive cough:     Wheezing:         Neurologic    Sudden weakness in arms or legs:     Sudden numbness in arms or legs:     Sudden onset of difficulty speaking or slurred speech:    Temporary loss of vision in one eye:     Problems with dizziness:         Gastrointestinal    Blood in stool:     Vomited blood:         Genitourinary    Burning when urinating:     Blood in urine:        Psychiatric    Major depression:         Hematologic    Bleeding problems:    Problems with blood clotting too easily:        Skin    Rashes or ulcers:        Constitutional    Fever or chills:      PHYSICAL EXAM:   Vitals:   09/13/17 1537  BP: 127/83  Pulse: 75  Resp: 16  Temp: 97.7 F (36.5 C)  TempSrc: Oral  SpO2: 95%  Weight: 235 lb (106.6 kg)  Height: 5\' 11"  (1.803 m)    GENERAL: The patient is a well-nourished male, in no acute distress. The vital signs are documented above. CARDIAC: There is a regular rate and rhythm.  VASCULAR: Palpable left dorsalis pedis and right posterior tibial pulse PULMONARY: Non-labored respirations ABDOMEN: Soft and non-tender with normal pitched bowel sounds.  MUSCULOSKELETAL: There are no major deformities or cyanosis. NEUROLOGIC: No focal weakness or paresthesias are detected. SKIN: There are no ulcers or rashes noted. PSYCHIATRIC: The  patient has a normal affect.  STUDIES:   I have ordered and reviewed his vascular lab studies with the following findings: ABI right: 1.10 (0.78) ABI left 1.32 (1.10) Duplex shows a  near normal examination with a widely patent stent without stenosis  MEDICAL ISSUES:   Status post percutaneous intervention to the right leg.  Ultrasound study showed the stent is widely patent.  He will follow-up in 6 months with repeat studies.    Annamarie Major, MD [facility] [phone] Pager [phone]

## 2017-09-14 ENCOUNTER — Other Ambulatory Visit: Payer: Self-pay

## 2017-09-23 DIAGNOSIS — N401 Enlarged prostate with lower urinary tract symptoms: Secondary | ICD-10-CM | POA: Diagnosis not present

## 2017-09-23 DIAGNOSIS — E1151 Type 2 diabetes mellitus with diabetic peripheral angiopathy without gangrene: Secondary | ICD-10-CM | POA: Diagnosis not present

## 2017-09-23 DIAGNOSIS — R400 Somnolence: Secondary | ICD-10-CM | POA: Diagnosis not present

## 2017-09-23 DIAGNOSIS — Z0000 Encounter for general adult medical examination without abnormal findings: Secondary | ICD-10-CM | POA: Diagnosis not present

## 2017-09-23 DIAGNOSIS — G479 Sleep disorder, unspecified: Secondary | ICD-10-CM | POA: Diagnosis not present

## 2017-09-23 DIAGNOSIS — F419 Anxiety disorder, unspecified: Secondary | ICD-10-CM | POA: Diagnosis not present

## 2017-09-23 DIAGNOSIS — Z794 Long term (current) use of insulin: Secondary | ICD-10-CM | POA: Diagnosis not present

## 2017-09-23 DIAGNOSIS — R972 Elevated prostate specific antigen [PSA]: Secondary | ICD-10-CM | POA: Diagnosis not present

## 2017-09-23 DIAGNOSIS — I10 Essential (primary) hypertension: Secondary | ICD-10-CM | POA: Diagnosis not present

## 2017-09-23 DIAGNOSIS — E1122 Type 2 diabetes mellitus with diabetic chronic kidney disease: Secondary | ICD-10-CM | POA: Diagnosis not present

## 2017-09-23 DIAGNOSIS — Z1389 Encounter for screening for other disorder: Secondary | ICD-10-CM | POA: Diagnosis not present

## 2017-09-23 DIAGNOSIS — E782 Mixed hyperlipidemia: Secondary | ICD-10-CM | POA: Diagnosis not present

## 2017-09-29 DIAGNOSIS — G4733 Obstructive sleep apnea (adult) (pediatric): Secondary | ICD-10-CM | POA: Diagnosis not present

## 2017-10-01 DIAGNOSIS — E1165 Type 2 diabetes mellitus with hyperglycemia: Secondary | ICD-10-CM | POA: Diagnosis not present

## 2017-10-01 DIAGNOSIS — Z794 Long term (current) use of insulin: Secondary | ICD-10-CM | POA: Diagnosis not present

## 2017-10-11 ENCOUNTER — Other Ambulatory Visit: Payer: Self-pay

## 2017-10-11 DIAGNOSIS — Z959 Presence of cardiac and vascular implant and graft, unspecified: Secondary | ICD-10-CM

## 2017-10-11 DIAGNOSIS — I739 Peripheral vascular disease, unspecified: Secondary | ICD-10-CM

## 2017-10-11 DIAGNOSIS — R0989 Other specified symptoms and signs involving the circulatory and respiratory systems: Secondary | ICD-10-CM

## 2017-10-11 DIAGNOSIS — Z8679 Personal history of other diseases of the circulatory system: Secondary | ICD-10-CM

## 2017-11-08 ENCOUNTER — Encounter: Payer: Self-pay | Admitting: Physician Assistant

## 2017-11-08 NOTE — Progress Notes (Signed)
Cardiology Office Note    Date:  11/10/2017  ID:  Michael [name], DOB March 02, 1951, MRN [id number] PCP:  Starlyn Skeans, PA-C  Cardiologist:  Larae Grooms, MD   Chief Complaint: shoulder pain  History of Present Illness:  Michael [name] is a 67 y.o. male with history of lower extremity PAD s/p stenting (followed by [facility]), anxiety, DM, HTN, HLD, obesity, low testosterone who presents for overdue 1 year follow-up and evaluation of LEE  He has no formal history of ischemic heart disease. 2D echo 2013 showed EF 60-65%, mild LVH. Stress test in 2014 was normal, EF 70%. He does have PAD and is followed by Dr. Trula Slade. He has no h/o smoking. He has prior hx of LE stenting in 2012 and angioplasty/stenting in 03/2017. Last labs by [facility] 08/2017 showed Tchol 87, LDL 23, trig 158, A1C 9.4, Hgb 14.1, Cr 0.830, K 4.1, ALT 12, TSH 0.450.   He follows his DM very closely, and has been working with Dr. Buddy Duty for medicine adjustments. Apparently he had some diarrheal reactions when Trulicity was increased and he was on Jardiance so he is now on insulin. He goes to the fire dept 2x/week to check his BP and it usually runs 112/60. He initially called for an appointment for some right shoulder pain that was occurring after sleeping in odd positions with his arm wrapped around his chest. This was not changed with exertion. It totally resolved a month ago after changing the recliner he liked to fall asleep in. He walks 1/2 mile daily and goes up 5-8 flights of steps every day without chest pain or dyspnea. No palpitations, syncope or near syncope. He did notice a few months ago that he began to have some trace ankle edema. It is worse working outside, improved with elevation. He eats a can of soup for lunch every day but in general tries to follow a very healthy diet.   Past Medical History:  Diagnosis Date  . Allergy   . Anxiety   . Diabetes mellitus   . Hyperlipidemia   . Hypertension   . PAD (peripheral  artery disease) ([place])    a. s/p prior LE stenting 2012, 03/2017 - followed by [facility].    Past Surgical History:  Procedure Laterality Date  . ABDOMINAL AORTOGRAM N/A 09/01/2016   Procedure: Abdominal Aortogram;  Surgeon: Serafina Mitchell, MD;  Location: [facility];  Service: Cardiovascular;  Laterality: N/A;  . ABDOMINAL AORTOGRAM W/LOWER EXTREMITY N/A 03/16/2017   Procedure: ABDOMINAL AORTOGRAM W/LOWER EXTREMITY;  Surgeon: Serafina Mitchell, MD;  Location: [facility];  Service: Cardiovascular;  Laterality: N/A;  . ANGIOPLASTY / STENTING FEMORAL  11/05/10   Left SFA stent  . KNEE ARTHROSCOPY    . LOWER EXTREMITY ANGIOGRAPHY N/A 09/01/2016   Procedure: Lower Extremity Angiography;  Surgeon: Serafina Mitchell, MD;  Location: [facility];  Service: Cardiovascular;  Laterality: N/A;  . PERIPHERAL VASCULAR ATHERECTOMY Right 09/01/2016   Procedure: Peripheral Vascular Atherectomy;  Surgeon: Serafina Mitchell, MD;  Location: [facility];  Service: Cardiovascular;  Laterality: Right;  superficial femoral  . PERIPHERAL VASCULAR INTERVENTION Right 03/16/2017   Procedure: PERIPHERAL VASCULAR INTERVENTION;  Surgeon: Serafina Mitchell, MD;  Location: [facility];  Service: Cardiovascular;  Laterality: Right;  superficicial femoral  . ROTATOR CUFF REPAIR     1990    Current Medications: Current Meds  Medication Sig  . ACCU-CHEK AVIVA PLUS test strip AS DIRECTED  SEVEN TIMES A DAY IN VITRO 90 DAYS  . amLODipine (NORVASC) 10 MG tablet Take 10 mg by mouth daily.    Marland Kitchen atorvastatin (LIPITOR) 40 MG tablet Take 40 mg by mouth at bedtime.  . benazepril (LOTENSIN) 40 MG tablet Take 40 mg by mouth daily.    . clonazePAM (KLONOPIN) 0.5 MG tablet TAKE HALF TABLET DAILY AS NEEDED FOR SLEEP  . clopidogrel (PLAVIX) 75 MG tablet Take 1 tablet (75 mg total) by mouth daily with breakfast.  . fexofenadine (ALLEGRA) 180 MG tablet Take 180 mg by mouth daily.  . fluticasone (FLONASE) 50 MCG/ACT  nasal spray Place 2 sprays into both nostrils at bedtime as needed (before uses CPAP if needed).   . Homeopathic Products (PSORIASIS/ECZEMA RELIEF EX) Apply 1 application topically daily.  . hydrochlorothiazide (HYDRODIURIL) 25 MG tablet Take 12.5 mg by mouth daily.  Marland Kitchen ibuprofen (ADVIL,MOTRIN) 200 MG tablet Take 800 mg by mouth daily as needed for mild pain.   Marland Kitchen insulin glargine (LANTUS) 100 UNIT/ML injection Inject 24 Units into the skin at bedtime.   . metoprolol succinate (TOPROL-XL) 100 MG 24 hr tablet Take 100 mg by mouth daily.   Marland Kitchen NOVOLOG FLEXPEN 100 UNIT/ML FlexPen USE 18 UNITS IF BLOOD SUGAR 86 TO 126, THEN 1 UNIT FOR EVERY 20MG /DL ABOVE GOAL. 3 TIMES DAILY WITH MEALS  . tamsulosin (FLOMAX) 0.4 MG CAPS capsule TAKE 1 CAPSULE BY MOUTH EVERY DAY AFTER DINNER      Allergies:   Demerol; Lactose intolerance (gi); Penicillins; Actos [pioglitazone]; Byetta 10 mcg pen [exenatide]; Glimepiride; Nabumetone; and Oxycodone   Social History   Socioeconomic History  . Marital status: Married    Spouse name: Not on file  . Number of children: Not on file  . Years of education: Not on file  . Highest education level: Not on file  Occupational History  . Not on file  Social Needs  . Financial resource strain: Not on file  . Food insecurity:    Worry: Not on file    Inability: Not on file  . Transportation needs:    Medical: Not on file    Non-medical: Not on file  Tobacco Use  . Smoking status: Never Smoker  . Smokeless tobacco: Former Network engineer and Sexual Activity  . Alcohol use: Yes    Comment: occasional drink  . Drug use: No  . Sexual activity: Not on file  Lifestyle  . Physical activity:    Days per week: Not on file    Minutes per session: Not on file  . Stress: Not on file  Relationships  . Social connections:    Talks on phone: Not on file    Gets together: Not on file    Attends religious service: Not on file    Active member of club or organization: Not on  file    Attends meetings of clubs or organizations: Not on file    Relationship status: Not on file  Other Topics Concern  . Not on file  Social History Narrative  . Not on file     Family History:  The patient's family history includes Cancer in his sister; Deep vein thrombosis in his father; Heart attack in his father; Heart disease in his father; Hyperlipidemia in his father and mother; Hypertension in his father and mother; Peripheral vascular disease in his father.  ROS:   Please see the history of present illness.  All other systems are reviewed and otherwise negative.  PHYSICAL EXAM:   VS:  BP 138/78   Pulse 73   Ht 5\' 11"  (1.803 m)   Wt 237 lb 6.4 oz (107.7 kg)   SpO2 98%   BMI 33.11 kg/m   BMI: Body mass index is 33.11 kg/m. GEN: Well nourished, well developed WM, in no acute distress HEENT: normocephalic, atraumatic Neck: no JVD, carotid bruits, or masses Cardiac: RRR; no murmurs, rubs, or gallops, trace nonpitting puffiness of ankles bilaterally without erythema Respiratory: clear to auscultation bilaterally, normal work of breathing GI: soft, nontender, nondistended, + BS MS: no deformity or atrophy Skin: warm and dry, no rash Neuro:  Alert and Oriented x 3, Strength and sensation are intact, follows commands Psych: euthymic mood, full affect  Wt Readings from Last 3 Encounters:  11/10/17 237 lb 6.4 oz (107.7 kg)  09/13/17 235 lb (106.6 kg)  03/16/17 232 lb (105.2 kg)      Studies/Labs Reviewed:   EKG:  EKG was ordered today and personally reviewed by me and demonstrates NSR 73bpm, nonspecific TW changes similar to prior.   Recent Labs: 03/16/2017: BUN 21; Creatinine, Ser 0.80; Hemoglobin 14.6; Potassium 3.5; Sodium 140   Lipid Panel No results found for: CHOL, TRIG, HDL, CHOLHDL, VLDL, LDLCALC, LDLDIRECT  Additional studies/ records that were reviewed today include: Summarized above    ASSESSMENT & PLAN:   1. Lower extremity edema - very  mild, I suspect related to amlodipine and also possibly driven by gradual weight increase. Will decrease amlodipine to 5mg  daily and increase HCTZ to 25mg  daily. He may need KCl supplement but he prefers to increase dietary potassium first. Check BMET 1 week to assess. Discussed reduction of sodium in diet (can of soup is likely not helping), restricting fluids to 64 oz per day, elevation of legs. Will also update 2D echo to exclude any subtle structural changes.  2. Shoulder pain - sounds orthopedic in nature, resolved with changing the way he sleeps. This was non-exertional in nature and occurred on nights he was overreaching with his right arm. If shoulder pain recurs, would need to consider updating ischemic testing given his known PAD. For now he has been able to remain very active without any anginal-type symptoms. 3. PAD - followed by [facility]. Claudication improved s/p PTA.  4. HTN - follow with changes above. He continues to monitor BP 2x/week with fire dept. 5. Hyperlipidemia - recent LDL looked great. Continue current regimen. I will defer to the provider that manages his cholesterol to consider further titration to 80mg  daily given his DM/PAD.  Disposition: F/u with Dr. Irish Lack in 1 year, sooner if any issues arise or edema worsens.  Medication Adjustments/Labs and Tests Ordered: Current medicines are reviewed at length with the patient today.  Concerns regarding medicines are outlined above. Medication changes, Labs and Tests ordered today are summarized above and listed in the Patient Instructions accessible in Encounters.   Signed, Charlie Pitter, PA-C  11/10/2017 8:16 AM    [facility] [place], [place], [place]  [address] Phone: [phone]; Fax: [phone]

## 2017-11-10 ENCOUNTER — Ambulatory Visit (INDEPENDENT_AMBULATORY_CARE_PROVIDER_SITE_OTHER): Payer: Medicare Other | Admitting: Physician Assistant

## 2017-11-10 ENCOUNTER — Encounter: Payer: Self-pay | Admitting: Physician Assistant

## 2017-11-10 ENCOUNTER — Encounter (INDEPENDENT_AMBULATORY_CARE_PROVIDER_SITE_OTHER): Payer: Self-pay

## 2017-11-10 VITALS — BP 138/78 | HR 73 | Ht 71.0 in | Wt 237.4 lb

## 2017-11-10 DIAGNOSIS — M25531 Pain in right wrist: Secondary | ICD-10-CM | POA: Insufficient documentation

## 2017-11-10 DIAGNOSIS — I10 Essential (primary) hypertension: Secondary | ICD-10-CM

## 2017-11-10 DIAGNOSIS — S62001D Unspecified fracture of navicular [scaphoid] bone of right wrist, subsequent encounter for fracture with routine healing: Secondary | ICD-10-CM | POA: Diagnosis not present

## 2017-11-10 DIAGNOSIS — I739 Peripheral vascular disease, unspecified: Secondary | ICD-10-CM | POA: Diagnosis not present

## 2017-11-10 DIAGNOSIS — M25511 Pain in right shoulder: Secondary | ICD-10-CM | POA: Diagnosis not present

## 2017-11-10 DIAGNOSIS — R600 Localized edema: Secondary | ICD-10-CM | POA: Diagnosis not present

## 2017-11-10 DIAGNOSIS — E785 Hyperlipidemia, unspecified: Secondary | ICD-10-CM | POA: Diagnosis not present

## 2017-11-10 MED ORDER — HYDROCHLOROTHIAZIDE 25 MG PO TABS: 25.0000 mg | ORAL_TABLET | Freq: Every day | ORAL | 3 refills | Status: DC

## 2017-11-10 MED ORDER — AMLODIPINE BESYLATE 5 MG PO TABS: 5.0000 mg | ORAL_TABLET | Freq: Every day | ORAL | 3 refills | Status: DC

## 2017-11-10 NOTE — Patient Instructions (Signed)
Medication Instructions:  Your physician has recommended you make the following change in your medication: 1.) decrease amlodipine to 5 mg daily 2.) increase hctz (hydrochlorothiazide) to 25 mg daily  Labwork: Your physician recommends that you return for lab work in: 1 week (BMET)  Testing/Procedures: Your physician has requested that you have an echocardiogram. Echocardiography is a painless test that uses sound waves to create images of your heart. It provides your doctor with information about the size and shape of your heart and how well your heart's chambers and valves are working. This procedure takes approximately one hour. There are no restrictions for this procedure.    Follow-Up: Your physician wants you to follow-up in: 1 year with Dr. Irish Lack.  You will receive a reminder letter in the mail two months in advance. If you don't receive a letter, please call our office to schedule the follow-up appointment.   Any Other Special Instructions Will Be Listed Below (If Applicable).     If you need a refill on your cardiac medications before your next appointment, please call your pharmacy.   Low-Sodium Eating Plan Sodium, which is an element that makes up salt, helps you maintain a healthy balance of fluids in your body. Too much sodium can increase your blood pressure and cause fluid and waste to be held in your body. Your health care provider or dietitian may recommend following this plan if you have high blood pressure (hypertension), kidney disease, liver disease, or heart failure. Eating less sodium can help lower your blood pressure, reduce swelling, and protect your heart, liver, and kidneys. What are tips for following this plan? General guidelines  Most people on this plan should limit their sodium intake to 1,500-2,000 mg (milligrams) of sodium each day. Reading food labels  The Nutrition Facts label lists the amount of sodium in one serving of the food. If you eat  more than one serving, you must multiply the listed amount of sodium by the number of servings.  Choose foods with less than 140 mg of sodium per serving.  Avoid foods with 300 mg of sodium or more per serving. Shopping  Look for lower-sodium products, often labeled as "low-sodium" or "no salt added."  Always check the sodium content even if foods are labeled as "unsalted" or "no salt added".  Buy fresh foods. ? Avoid canned foods and premade or frozen meals. ? Avoid canned, cured, or processed meats  Buy breads that have less than 80 mg of sodium per slice. Cooking  Eat more home-cooked food and less restaurant, buffet, and fast food.  Avoid adding salt when cooking. Use salt-free seasonings or herbs instead of table salt or sea salt. Check with your health care provider or pharmacist before using salt substitutes.  Cook with plant-based oils, such as canola, sunflower, or olive oil. Meal planning  When eating at a restaurant, ask that your food be prepared with less salt or no salt, if possible.  Avoid foods that contain MSG (monosodium glutamate). MSG is sometimes added to [place] food, bouillon, and some canned foods. What foods are recommended? The items listed may not be a complete list. Talk with your dietitian about what dietary choices are best for you. Grains Low-sodium cereals, including oats, puffed wheat and rice, and shredded wheat. Low-sodium crackers. Unsalted rice. Unsalted pasta. Low-sodium bread. Whole-grain breads and whole-grain pasta. Vegetables Fresh or frozen vegetables. "No salt added" canned vegetables. "No salt added" tomato sauce and paste. Low-sodium or reduced-sodium tomato and vegetable juice.  Fruits Fresh, frozen, or canned fruit. Fruit juice. Meats and other protein foods Fresh or frozen (no salt added) meat, poultry, seafood, and fish. Low-sodium canned tuna and salmon. Unsalted nuts. Dried peas, beans, and lentils without added salt. Unsalted  canned beans. Eggs. Unsalted nut butters. Dairy Milk. Soy milk. Cheese that is naturally low in sodium, such as ricotta cheese, fresh mozzarella, or Swiss cheese Low-sodium or reduced-sodium cheese. Cream cheese. Yogurt. Fats and oils Unsalted butter. Unsalted margarine with no trans fat. Vegetable oils such as canola or olive oils. Seasonings and other foods Fresh and dried herbs and spices. Salt-free seasonings. Low-sodium mustard and ketchup. Sodium-free salad dressing. Sodium-free light mayonnaise. Fresh or refrigerated horseradish. Lemon juice. Vinegar. Homemade, reduced-sodium, or low-sodium soups. Unsalted popcorn and pretzels. Low-salt or salt-free chips. What foods are not recommended? The items listed may not be a complete list. Talk with your dietitian about what dietary choices are best for you. Grains Instant hot cereals. Bread stuffing, pancake, and biscuit mixes. Croutons. Seasoned rice or pasta mixes. Noodle soup cups. Boxed or frozen macaroni and cheese. Regular salted crackers. Self-rising flour. Vegetables Sauerkraut, pickled vegetables, and relishes. Olives. [place] fries. Onion rings. Regular canned vegetables (not low-sodium or reduced-sodium). Regular canned tomato sauce and paste (not low-sodium or reduced-sodium). Regular tomato and vegetable juice (not low-sodium or reduced-sodium). Frozen vegetables in sauces. Meats and other protein foods Meat or fish that is salted, canned, smoked, spiced, or pickled. Bacon, ham, sausage, hotdogs, corned beef, chipped beef, packaged lunch meats, salt pork, jerky, pickled herring, anchovies, regular canned tuna, sardines, salted nuts. Dairy Processed cheese and cheese spreads. Cheese curds. Blue cheese. Feta cheese. String cheese. Regular cottage cheese. Buttermilk. Canned milk. Fats and oils Salted butter. Regular margarine. Ghee. Bacon fat. Seasonings and other foods Onion salt, garlic salt, seasoned salt, table salt, and sea salt.  Canned and packaged gravies. Worcestershire sauce. Tartar sauce. Barbecue sauce. Teriyaki sauce. Soy sauce, including reduced-sodium. Steak sauce. Fish sauce. Oyster sauce. Cocktail sauce. Horseradish that you find on the shelf. Regular ketchup and mustard. Meat flavorings and tenderizers. Bouillon cubes. Hot sauce and Tabasco sauce. Premade or packaged marinades. Premade or packaged taco seasonings. Relishes. Regular salad dressings. Salsa. Potato and tortilla chips. Corn chips and puffs. Salted popcorn and pretzels. Canned or dried soups. Pizza. Frozen entrees and pot pies. Summary  Eating less sodium can help lower your blood pressure, reduce swelling, and protect your heart, liver, and kidneys.  Most people on this plan should limit their sodium intake to 1,500-2,000 mg (milligrams) of sodium each day.  Canned, boxed, and frozen foods are high in sodium. Restaurant foods, fast foods, and pizza are also very high in sodium. You also get sodium by adding salt to food.  Try to cook at home, eat more fresh fruits and vegetables, and eat less fast food, canned, processed, or prepared foods. This information is not intended to replace advice given to you by your health care provider. Make sure you discuss any questions you have with your health care provider. Document Released: 08/08/2001 Document Revised: 02/10/2016 Document Reviewed: 02/10/2016 Elsevier Interactive Patient Education  [employer].

## 2017-11-12 ENCOUNTER — Other Ambulatory Visit: Payer: Self-pay | Admitting: Orthopedic Surgery

## 2017-11-12 DIAGNOSIS — M25531 Pain in right wrist: Secondary | ICD-10-CM

## 2017-11-16 ENCOUNTER — Other Ambulatory Visit: Payer: Self-pay

## 2017-11-16 ENCOUNTER — Other Ambulatory Visit (HOSPITAL_COMMUNITY): Payer: Self-pay

## 2017-11-18 ENCOUNTER — Other Ambulatory Visit: Payer: Medicare Other | Admitting: *Deleted

## 2017-11-18 ENCOUNTER — Other Ambulatory Visit: Payer: Self-pay

## 2017-11-18 ENCOUNTER — Ambulatory Visit (HOSPITAL_COMMUNITY): Payer: Medicare Other | Attending: Physician Assistant

## 2017-11-18 DIAGNOSIS — I739 Peripheral vascular disease, unspecified: Secondary | ICD-10-CM

## 2017-11-18 DIAGNOSIS — E1151 Type 2 diabetes mellitus with diabetic peripheral angiopathy without gangrene: Secondary | ICD-10-CM | POA: Diagnosis not present

## 2017-11-18 DIAGNOSIS — E785 Hyperlipidemia, unspecified: Secondary | ICD-10-CM | POA: Diagnosis not present

## 2017-11-18 DIAGNOSIS — R600 Localized edema: Secondary | ICD-10-CM | POA: Insufficient documentation

## 2017-11-18 DIAGNOSIS — I10 Essential (primary) hypertension: Secondary | ICD-10-CM | POA: Diagnosis not present

## 2017-11-18 DIAGNOSIS — M25511 Pain in right shoulder: Secondary | ICD-10-CM | POA: Diagnosis not present

## 2017-11-18 LAB — BASIC METABOLIC PANEL
BUN/Creatinine Ratio: 18 (ref 10–24)
BUN: 15 mg/dL (ref 8–27)
CALCIUM: 9.2 mg/dL (ref 8.6–10.2)
CO2: 24 mmol/L (ref 20–29)
CREATININE: 0.85 mg/dL (ref 0.76–1.27)
Chloride: 101 mmol/L (ref 96–106)
GFR calc Af Amer: 103 mL/min/{1.73_m2} (ref >59)
GFR, EST NON AFRICAN AMERICAN: 90 mL/min/{1.73_m2} (ref >59)
Glucose: 249 mg/dL — ABNORMAL HIGH (ref 65–99)
Potassium: 3.7 mmol/L (ref 3.5–5.2)
Sodium: 140 mmol/L (ref 134–144)

## 2017-11-19 ENCOUNTER — Telehealth: Payer: Self-pay

## 2017-11-19 ENCOUNTER — Ambulatory Visit
Admission: RE | Admit: 2017-11-19 | Discharge: 2017-11-19 | Disposition: A | Discharge status: Home / Self Care | Payer: Medicare Other | Source: Ambulatory Visit | Attending: Orthopedic Surgery | Admitting: Orthopedic Surgery

## 2017-11-19 DIAGNOSIS — M65841 Other synovitis and tenosynovitis, right hand: Secondary | ICD-10-CM | POA: Diagnosis not present

## 2017-11-19 DIAGNOSIS — M25531 Pain in right wrist: Secondary | ICD-10-CM

## 2017-11-19 MED ORDER — POTASSIUM CHLORIDE ER 10 MEQ PO TBCR: 20.0000 meq | EXTENDED_RELEASE_TABLET | Freq: Every day | ORAL | 3 refills | Status: DC

## 2017-11-19 NOTE — Telephone Encounter (Signed)
-----   Message from Charlie Pitter, [place] sent at 11/19/2017  7:57 AM EDT ----- Please let patient know BMET looks fine except blood sugar is high. We do like to see the potassium closer to 4.0 so I would suggest he also start KCl 25meq daily since HCTZ can deplete potassium.  How is edema?  Dayna Dunn PA-C

## 2017-11-19 NOTE — Telephone Encounter (Signed)
Spoke with patient regarding his BMET results. He accepted the potassium 20 meq daily. The patient said his PCP is working with his diabetes. He stated he is avoiding canned foods, to help reduce his salt intake. His edema in his ankles has completely resolved since starting HCTZ.

## 2017-11-22 ENCOUNTER — Telehealth: Payer: Self-pay | Admitting: Interventional Cardiology

## 2017-11-22 DIAGNOSIS — R609 Edema, unspecified: Secondary | ICD-10-CM

## 2017-11-22 MED ORDER — FUROSEMIDE 20 MG PO TABS: 20.0000 mg | ORAL_TABLET | Freq: Every day | ORAL | 11 refills | Status: DC

## 2017-11-22 NOTE — Telephone Encounter (Signed)
New Message    Pt c/o medication issue:  1. Name of Medication: amLODipine (NORVASC) 5 MG tablet and hydrochlorothiazide (HYDRODIURIL) 25 MG tablet  2. How are you currently taking this medication (dosage and times per day)?   3. Are you having a reaction (difficulty breathing--STAT)? Swelling in legs and feet   4. What is your medication issue? Patient states that he came in because his feet was swelling and they lowered the amlodipine to the 5 mg and increased the hydrochlorothiazide

## 2017-11-22 NOTE — Telephone Encounter (Signed)
It looks like when he came in for the nurse visit, edema had resolved on the increased HCTZ/lower amlodipine.  Make sure he is not eating a can of soup for lunch every day again - we discussed lowering sodium in diet and he seemed to struggle with this concept in the office visit (he stated "it's not like I can avoid it if it's already in the food"). Make sure to limit ALL fluid intake to no more than 64 oz. I would recommend he stop HCTZ and switch to Lasix - take 40mg  x 2 days then 20mg  daily. Recommend BMET in 1 week. Call if symptoms not improving.  I also see he's taking ibuprofen - this can run up edema so would cut back if he is able to.  Michael Bissonnette PA-C

## 2017-11-22 NOTE — Telephone Encounter (Signed)
Instructed patient to STOP HCTZ and START LASIX 20 mg daily. He understands to take Lasix 40 mg the first two days.  He will limit fluid intake to 64 oz daily. Instructed him to attempt to limit ibuprofen.  Discussed in great detail low salt diet and how canned goods and preserved foods are filled with sodium. BMET scheduled next Thursday. Patient was grateful for assistance and agrees with treatment plan.

## 2017-11-22 NOTE — Telephone Encounter (Signed)
Michael [name] reports worsening feet/ankle swelling in the last few days. He states the swelling is worse than when he was in the office 9/11. His shoes "barely fit" today. He has been sitting for about 45 minutes with his feet propped up and he has noticed minimal improvement. He reports he is strictly limiting salt.  He does not have any other symptoms today.  He is concerned about the swelling and is not sure if the swelling is caused by cardiac issues/medications or his diabetes as Dr. Buddy Duty has changed medications several times over the last few weeks. He will be speaking with Dr. Buddy Duty soon as well.  This morning, his BP was 112/ 62 (after medications). He then had a cup of coffee and checked his BP again. It was 152/78. He thinks he is just very susceptible to caffeine.   He understands he will be called with Dr. Hassell Done recommendations.

## 2017-11-24 DIAGNOSIS — M9903 Segmental and somatic dysfunction of lumbar region: Secondary | ICD-10-CM | POA: Diagnosis not present

## 2017-11-24 DIAGNOSIS — M62830 Muscle spasm of back: Secondary | ICD-10-CM | POA: Diagnosis not present

## 2017-11-24 DIAGNOSIS — M5136 Other intervertebral disc degeneration, lumbar region: Secondary | ICD-10-CM | POA: Diagnosis not present

## 2017-11-24 DIAGNOSIS — M545 Low back pain: Secondary | ICD-10-CM | POA: Diagnosis not present

## 2017-11-25 DIAGNOSIS — M545 Low back pain: Secondary | ICD-10-CM | POA: Diagnosis not present

## 2017-11-25 DIAGNOSIS — M9903 Segmental and somatic dysfunction of lumbar region: Secondary | ICD-10-CM | POA: Diagnosis not present

## 2017-11-25 DIAGNOSIS — M5136 Other intervertebral disc degeneration, lumbar region: Secondary | ICD-10-CM | POA: Diagnosis not present

## 2017-11-25 DIAGNOSIS — M62830 Muscle spasm of back: Secondary | ICD-10-CM | POA: Diagnosis not present

## 2017-12-01 DIAGNOSIS — M25531 Pain in right wrist: Secondary | ICD-10-CM | POA: Diagnosis not present

## 2017-12-01 DIAGNOSIS — Z23 Encounter for immunization: Secondary | ICD-10-CM | POA: Diagnosis not present

## 2017-12-02 ENCOUNTER — Other Ambulatory Visit: Payer: Medicare Other | Admitting: *Deleted

## 2017-12-02 DIAGNOSIS — R609 Edema, unspecified: Secondary | ICD-10-CM | POA: Diagnosis not present

## 2017-12-02 LAB — BASIC METABOLIC PANEL
BUN/Creatinine Ratio: 22 (ref 10–24)
BUN: 17 mg/dL (ref 8–27)
CALCIUM: 9 mg/dL (ref 8.6–10.2)
CHLORIDE: 101 mmol/L (ref 96–106)
CO2: 23 mmol/L (ref 20–29)
Creatinine, Ser: 0.78 mg/dL (ref 0.76–1.27)
GFR, EST AFRICAN AMERICAN: 107 mL/min/{1.73_m2} (ref >59)
GFR, EST NON AFRICAN AMERICAN: 93 mL/min/{1.73_m2} (ref >59)
Glucose: 156 mg/dL — ABNORMAL HIGH (ref 65–99)
Potassium: 4.3 mmol/L (ref 3.5–5.2)
SODIUM: 140 mmol/L (ref 134–144)

## 2017-12-03 DIAGNOSIS — E1151 Type 2 diabetes mellitus with diabetic peripheral angiopathy without gangrene: Secondary | ICD-10-CM | POA: Diagnosis not present

## 2017-12-03 DIAGNOSIS — Z5181 Encounter for therapeutic drug level monitoring: Secondary | ICD-10-CM | POA: Diagnosis not present

## 2017-12-03 DIAGNOSIS — E230 Hypopituitarism: Secondary | ICD-10-CM | POA: Diagnosis not present

## 2017-12-03 DIAGNOSIS — R972 Elevated prostate specific antigen [PSA]: Secondary | ICD-10-CM | POA: Diagnosis not present

## 2017-12-03 DIAGNOSIS — Z794 Long term (current) use of insulin: Secondary | ICD-10-CM | POA: Diagnosis not present

## 2017-12-09 DIAGNOSIS — G8929 Other chronic pain: Secondary | ICD-10-CM | POA: Diagnosis not present

## 2017-12-09 DIAGNOSIS — M5442 Lumbago with sciatica, left side: Secondary | ICD-10-CM | POA: Diagnosis not present

## 2017-12-27 DIAGNOSIS — M545 Low back pain: Secondary | ICD-10-CM | POA: Diagnosis not present

## 2017-12-27 DIAGNOSIS — M5416 Radiculopathy, lumbar region: Secondary | ICD-10-CM | POA: Diagnosis not present

## 2017-12-27 DIAGNOSIS — M25552 Pain in left hip: Secondary | ICD-10-CM | POA: Insufficient documentation

## 2017-12-28 ENCOUNTER — Other Ambulatory Visit: Payer: Self-pay | Admitting: Physician Assistant

## 2017-12-28 DIAGNOSIS — M545 Low back pain, unspecified: Secondary | ICD-10-CM

## 2017-12-30 DIAGNOSIS — R972 Elevated prostate specific antigen [PSA]: Secondary | ICD-10-CM | POA: Diagnosis not present

## 2018-01-06 ENCOUNTER — Telehealth: Payer: Self-pay | Admitting: Interventional Cardiology

## 2018-01-06 MED ORDER — FUROSEMIDE 20 MG PO TABS: 20.0000 mg | ORAL_TABLET | ORAL | 11 refills | Status: DC

## 2018-01-06 MED ORDER — POTASSIUM CHLORIDE ER 10 MEQ PO TBCR: 20.0000 meq | EXTENDED_RELEASE_TABLET | ORAL | 3 refills | Status: DC

## 2018-01-06 NOTE — Telephone Encounter (Signed)
Patient made aware to decrease lasix and potassium to QOD to see if that helps to improve his dry mouth. Med list updated. Made patient aware that Dayna agreed with using Biotene. Patient understands to monitor BP and let us know if SBP is consistently >130 mmHg.

## 2018-01-06 NOTE — Telephone Encounter (Signed)
Let's have him reduce Lasix (and potassium) to every other day. With this change he should follow his BP and call if running >414 systolic. Let me know if this does not improve his dry mouth. Agree with Biotene. Georgi Navarrete PA-C

## 2018-01-06 NOTE — Telephone Encounter (Signed)
Returned call to patient. He states that since he switched from HCTZ to lasix that he has experienced extreme dry mouth. He states that his LEE is better. Patient asking if there is anything that he can do. Made the patient aware that he can try biotene. Made patient aware that I would forward to Melina Copa, PA who recently made this change for review.

## 2018-01-06 NOTE — Telephone Encounter (Signed)
New Message:   Patient calling concerning some medication that is taking. Please call

## 2018-01-08 ENCOUNTER — Ambulatory Visit
Admission: RE | Admit: 2018-01-08 | Discharge: 2018-01-08 | Disposition: A | Discharge status: Home / Self Care | Payer: Medicare Other | Source: Ambulatory Visit | Attending: Physician Assistant | Admitting: Physician Assistant

## 2018-01-08 DIAGNOSIS — M5127 Other intervertebral disc displacement, lumbosacral region: Secondary | ICD-10-CM | POA: Diagnosis not present

## 2018-01-08 DIAGNOSIS — M545 Low back pain, unspecified: Secondary | ICD-10-CM

## 2018-01-08 DIAGNOSIS — M47816 Spondylosis without myelopathy or radiculopathy, lumbar region: Secondary | ICD-10-CM | POA: Diagnosis not present

## 2018-01-08 DIAGNOSIS — M5126 Other intervertebral disc displacement, lumbar region: Secondary | ICD-10-CM | POA: Diagnosis not present

## 2018-01-08 DIAGNOSIS — M48061 Spinal stenosis, lumbar region without neurogenic claudication: Secondary | ICD-10-CM | POA: Diagnosis not present

## 2018-01-12 DIAGNOSIS — C61 Malignant neoplasm of prostate: Secondary | ICD-10-CM | POA: Diagnosis not present

## 2018-01-12 DIAGNOSIS — R972 Elevated prostate specific antigen [PSA]: Secondary | ICD-10-CM | POA: Diagnosis not present

## 2018-01-18 DIAGNOSIS — M5416 Radiculopathy, lumbar region: Secondary | ICD-10-CM | POA: Insufficient documentation

## 2018-01-18 DIAGNOSIS — M545 Low back pain, unspecified: Secondary | ICD-10-CM | POA: Insufficient documentation

## 2018-01-19 DIAGNOSIS — M545 Low back pain: Secondary | ICD-10-CM | POA: Diagnosis not present

## 2018-01-19 DIAGNOSIS — M5416 Radiculopathy, lumbar region: Secondary | ICD-10-CM | POA: Diagnosis not present

## 2018-01-24 DIAGNOSIS — Z888 Allergy status to other drugs, medicaments and biological substances status: Secondary | ICD-10-CM | POA: Diagnosis not present

## 2018-01-24 DIAGNOSIS — C61 Malignant neoplasm of prostate: Secondary | ICD-10-CM | POA: Diagnosis not present

## 2018-01-24 DIAGNOSIS — Z880 Allergy status to penicillin: Secondary | ICD-10-CM | POA: Diagnosis not present

## 2018-01-24 DIAGNOSIS — Z885 Allergy status to narcotic agent status: Secondary | ICD-10-CM | POA: Diagnosis not present

## 2018-02-03 DIAGNOSIS — M5416 Radiculopathy, lumbar region: Secondary | ICD-10-CM | POA: Diagnosis not present

## 2018-02-07 DIAGNOSIS — Z01818 Encounter for other preprocedural examination: Secondary | ICD-10-CM | POA: Insufficient documentation

## 2018-02-09 ENCOUNTER — Other Ambulatory Visit: Payer: Self-pay | Admitting: Surgery

## 2018-02-09 DIAGNOSIS — C61 Malignant neoplasm of prostate: Secondary | ICD-10-CM | POA: Diagnosis not present

## 2018-02-09 DIAGNOSIS — R8290 Unspecified abnormal findings in urine: Secondary | ICD-10-CM | POA: Diagnosis not present

## 2018-02-09 DIAGNOSIS — I739 Peripheral vascular disease, unspecified: Secondary | ICD-10-CM

## 2018-02-14 DIAGNOSIS — Z01812 Encounter for preprocedural laboratory examination: Secondary | ICD-10-CM | POA: Diagnosis not present

## 2018-02-14 DIAGNOSIS — C61 Malignant neoplasm of prostate: Secondary | ICD-10-CM | POA: Diagnosis not present

## 2018-02-14 DIAGNOSIS — R8290 Unspecified abnormal findings in urine: Secondary | ICD-10-CM | POA: Diagnosis not present

## 2018-03-15 DIAGNOSIS — C61 Malignant neoplasm of prostate: Secondary | ICD-10-CM | POA: Diagnosis not present

## 2018-03-15 DIAGNOSIS — E1151 Type 2 diabetes mellitus with diabetic peripheral angiopathy without gangrene: Secondary | ICD-10-CM | POA: Diagnosis not present

## 2018-03-15 DIAGNOSIS — Z5181 Encounter for therapeutic drug level monitoring: Secondary | ICD-10-CM | POA: Diagnosis not present

## 2018-03-15 DIAGNOSIS — E230 Hypopituitarism: Secondary | ICD-10-CM | POA: Diagnosis not present

## 2018-03-15 DIAGNOSIS — Z794 Long term (current) use of insulin: Secondary | ICD-10-CM | POA: Diagnosis not present

## 2018-03-23 ENCOUNTER — Encounter: Payer: Self-pay | Admitting: *Deleted

## 2018-03-25 NOTE — Progress Notes (Signed)
GU Location of Tumor / Histology: Prostate Cancer  If Prostate Cancer, Gleason Score is (3 + 4) and PSA is (7.54)  Michael [name] presented about 3 months ago with signs/symptoms of: frequency, incomplete emptying and some dribbling at night.  Biopsies on 01/12/2018 revealed:   Past/Anticipated interventions by urology, if any: Prostate Biopsy, hormone injection to decrease testosterone.  Past/Anticipated interventions by medical oncology, if any:  None  Weight changes, if any: lost 12 lbs with trying, but is on diabetic medication that known side effect is weight loss.  Bowel/Bladder complaints, if any: Nocturia has improved dramatically since regulating sugar levels. Was 7-8 times night and now is 1 or not at all.  Does report incomplete emptying, increased frequency,   Nausea/Vomiting, if any: none  Pain issues, if any:  Left groin, left testicle rating 7 out of 10  SAFETY ISSUES:  Prior radiation? no  Pacemaker/ICD? no  Possible current pregnancy? N/A  Is the patient on methotrexate? no  Current Complaints / other details:  Patient has DM2.  No family history of prostate cancer. No alcohol use. Never smoked but did use smokeless tobacco. Patient is here today with wife [name]  He states he hasn't been able to be intimate with wife in sexual nature with wife in 2 years due to numerous stents placed in right leg.  Patient is very strong in his faith and isn't worried about his diagnosis but does feel like details up till now have been limited in regards to his diagnosis.  He states urology gave him an injection to decrease testosterone and since getting it he is having hot flashes, dry mouth and flushing.    BP (!) 157/88 (BP Location: Right Arm, Patient Position: Sitting)   Pulse (!) 116   Temp 98.6 F (37 C) (Oral)   Resp 20   Ht 5\' 11"  (1.803 m)   Wt 224 lb (101.6 kg)   SpO2 98%   BMI 31.24 kg/m   Wt Readings from Last 3 Encounters:  03/29/18 224 lb (101.6 kg)    03/28/18 222 lb (100.7 kg)  11/10/17 237 lb 6.4 oz (107.7 kg)

## 2018-03-28 ENCOUNTER — Other Ambulatory Visit: Payer: Self-pay

## 2018-03-28 ENCOUNTER — Ambulatory Visit (INDEPENDENT_AMBULATORY_CARE_PROVIDER_SITE_OTHER)
Admission: RE | Admit: 2018-03-28 | Discharge: 2018-03-28 | Disposition: A | Discharge status: Home / Self Care | Payer: Medicare Other | Source: Ambulatory Visit | Attending: Surgery | Admitting: Surgery

## 2018-03-28 ENCOUNTER — Ambulatory Visit: Payer: Self-pay

## 2018-03-28 ENCOUNTER — Ambulatory Visit (HOSPITAL_COMMUNITY)
Admission: RE | Admit: 2018-03-28 | Discharge: 2018-03-28 | Disposition: A | Discharge status: Home / Self Care | Payer: Medicare Other | Source: Ambulatory Visit | Attending: Surgery | Admitting: Surgery

## 2018-03-28 ENCOUNTER — Ambulatory Visit (INDEPENDENT_AMBULATORY_CARE_PROVIDER_SITE_OTHER): Payer: Medicare Other | Admitting: Physician Assistant

## 2018-03-28 VITALS — BP 130/83 | HR 87 | Temp 96.9°F | Resp 18 | Ht 71.0 in | Wt 222.0 lb

## 2018-03-28 DIAGNOSIS — I739 Peripheral vascular disease, unspecified: Secondary | ICD-10-CM | POA: Diagnosis not present

## 2018-03-28 DIAGNOSIS — R0989 Other specified symptoms and signs involving the circulatory and respiratory systems: Secondary | ICD-10-CM | POA: Insufficient documentation

## 2018-03-28 DIAGNOSIS — Z959 Presence of cardiac and vascular implant and graft, unspecified: Secondary | ICD-10-CM

## 2018-03-28 DIAGNOSIS — Z8679 Personal history of other diseases of the circulatory system: Secondary | ICD-10-CM

## 2018-03-28 DIAGNOSIS — I10 Essential (primary) hypertension: Secondary | ICD-10-CM | POA: Diagnosis not present

## 2018-03-28 DIAGNOSIS — E782 Mixed hyperlipidemia: Secondary | ICD-10-CM | POA: Diagnosis not present

## 2018-03-28 DIAGNOSIS — G479 Sleep disorder, unspecified: Secondary | ICD-10-CM | POA: Diagnosis not present

## 2018-03-28 DIAGNOSIS — N181 Chronic kidney disease, stage 1: Secondary | ICD-10-CM | POA: Diagnosis not present

## 2018-03-28 DIAGNOSIS — E1142 Type 2 diabetes mellitus with diabetic polyneuropathy: Secondary | ICD-10-CM | POA: Diagnosis not present

## 2018-03-28 NOTE — Progress Notes (Signed)
HISTORY AND PHYSICAL     CC:  follow up. Requesting Provider:  Rolene Course, PA-C  HPI: This is a 68 y.o. male who is here today for follow up.  He was initially seen by Dr. Kellie Simmering for right leg claudication.  Dr. Trula Slade subsequently placed a right SFA stent in September 2012.  In July 2018, he underwent atherectomy with drug-coated balloon angioplasty for in-stent stenosis within the right superficial femoral artery.  On March 16, 2017 he underwent stenting of his right superficial femoral artery for in-stent stenosis, after suboptimal results with angioplasty.  62mm overlapping Viabahn stents were placed.  The pt returns today for follow up.  He states he has done well with his legs.  He does not have any claudication.  He does have some right hip pain, but was recently diagnosed with disk problems.  He is a former Psychologist, educational in high school and college.  He has had multiple ortho surgeries.    He denies any non healing wounds.  He was recently diagnosed with Prostate cancer and is set to start radiation tx in the near future.  He states that his diabetes is controlled with multiple medications and his A1c has decreased to 7.  He has also lost about 12lbs over the 6 weeks.    He is unable to take Oxycontin as it causes him to want to jump in front of a car of jump off of a house.  He can take Hydrocodone.   The pt is on a statin for cholesterol management.    The pt does have diabetes.  He is on insulin. The pt is on BB, CCB & ACEI for hypertension.  The pt is not on an aspirin.  He is on Plavix.  Tobacco hx:  Never smoked.  Past Medical History:  Diagnosis Date  . Allergy   . Anxiety   . Diabetes mellitus   . Hyperlipidemia   . Hypertension   . PAD (peripheral artery disease) ([place])    a. s/p prior LE stenting 2012, 03/2017 - followed by [facility].    Past Surgical History:  Procedure Laterality Date  . ABDOMINAL AORTOGRAM N/A 09/01/2016   Procedure: Abdominal Aortogram;   Surgeon: Serafina Mitchell, MD;  Location: [facility];  Service: Cardiovascular;  Laterality: N/A;  . ABDOMINAL AORTOGRAM W/LOWER EXTREMITY N/A 03/16/2017   Procedure: ABDOMINAL AORTOGRAM W/LOWER EXTREMITY;  Surgeon: Serafina Mitchell, MD;  Location: [facility];  Service: Cardiovascular;  Laterality: N/A;  . ANGIOPLASTY / STENTING FEMORAL  11/05/10   Left SFA stent  . KNEE ARTHROSCOPY    . LOWER EXTREMITY ANGIOGRAPHY N/A 09/01/2016   Procedure: Lower Extremity Angiography;  Surgeon: Serafina Mitchell, MD;  Location: [facility];  Service: Cardiovascular;  Laterality: N/A;  . PERIPHERAL VASCULAR ATHERECTOMY Right 09/01/2016   Procedure: Peripheral Vascular Atherectomy;  Surgeon: Serafina Mitchell, MD;  Location: [facility];  Service: Cardiovascular;  Laterality: Right;  superficial femoral  . PERIPHERAL VASCULAR INTERVENTION Right 03/16/2017   Procedure: PERIPHERAL VASCULAR INTERVENTION;  Surgeon: Serafina Mitchell, MD;  Location: [facility];  Service: Cardiovascular;  Laterality: Right;  superficicial femoral  . ROTATOR CUFF REPAIR     1990    Allergies  Allergen Reactions  . Demerol Anaphylaxis  . Lactose Intolerance (Gi) Anaphylaxis  . Penicillins Anaphylaxis    Has patient had a PCN reaction causing immediate rash, facial/tongue/throat swelling, SOB or lightheadedness with hypotension: Yes Has patient  had a PCN reaction causing severe rash involving mucus membranes or skin necrosis: No Has patient had a PCN reaction that required hospitalization: No Has patient had a PCN reaction occurring within the last 10 years: No If all of the above answers are "NO", then may proceed with Cephalosporin use.   . Actos [Pioglitazone] Nausea Only  . Byetta 10 Mcg Pen [Exenatide] Nausea Only  . Glimepiride Other (See Comments)    Edema in ankles   . Nabumetone Nausea Only  . Oxycodone Nausea Only    Current Outpatient Medications  Medication Sig Dispense Refill  .  ACCU-CHEK AVIVA PLUS test strip AS DIRECTED SEVEN TIMES A DAY IN VITRO 90 DAYS  4  . amLODipine (NORVASC) 5 MG tablet Take 1 tablet (5 mg total) by mouth daily. 90 tablet 3  . atorvastatin (LIPITOR) 40 MG tablet Take 40 mg by mouth at bedtime.  5  . benazepril (LOTENSIN) 40 MG tablet Take 40 mg by mouth daily.      . clonazePAM (KLONOPIN) 0.5 MG tablet TAKE HALF TABLET DAILY AS NEEDED FOR SLEEP  5  . clopidogrel (PLAVIX) 75 MG tablet TAKE 1 TABLET (75 MG TOTAL) BY MOUTH DAILY WITH BREAKFAST. 90 tablet 3  . fexofenadine (ALLEGRA) 180 MG tablet Take 180 mg by mouth daily.    . fluticasone (FLONASE) 50 MCG/ACT nasal spray Place 2 sprays into both nostrils at bedtime as needed (before uses CPAP if needed).     . furosemide (LASIX) 20 MG tablet Take 1 tablet (20 mg total) by mouth every other day. 15 tablet 11  . Homeopathic Products (PSORIASIS/ECZEMA RELIEF EX) Apply 1 application topically daily.    Marland Kitchen ibuprofen (ADVIL,MOTRIN) 200 MG tablet Take 800 mg by mouth daily as needed for mild pain.     Marland Kitchen insulin glargine (LANTUS) 100 UNIT/ML injection Inject 24 Units into the skin at bedtime.     . metoprolol succinate (TOPROL-XL) 100 MG 24 hr tablet Take 100 mg by mouth daily.     Marland Kitchen NOVOLOG FLEXPEN 100 UNIT/ML FlexPen USE 18 UNITS IF BLOOD SUGAR 86 TO 126, THEN 1 UNIT FOR EVERY 20MG /DL ABOVE GOAL. 3 TIMES DAILY WITH MEALS  2  . potassium chloride (K-DUR) 10 MEQ tablet Take 2 tablets (20 mEq total) by mouth every other day. With Lasix 90 tablet 3  . tamsulosin (FLOMAX) 0.4 MG CAPS capsule TAKE 1 CAPSULE BY MOUTH EVERY DAY AFTER DINNER  5   No current facility-administered medications for this visit.     Family History  Problem Relation Age of Onset  . Hyperlipidemia Mother   . Hypertension Mother   . Heart disease Father   . Deep vein thrombosis Father   . Hyperlipidemia Father   . Hypertension Father   . Heart attack Father   . Peripheral vascular disease Father   . Cancer Sister     Social  History   Socioeconomic History  . Marital status: Married    Spouse name: Not on file  . Number of children: Not on file  . Years of education: Not on file  . Highest education level: Not on file  Occupational History  . Not on file  Social Needs  . Financial resource strain: Not on file  . Food insecurity:    Worry: Not on file    Inability: Not on file  . Transportation needs:    Medical: Not on file    Non-medical: Not on file  Tobacco Use  . Smoking  status: Never Smoker  . Smokeless tobacco: Former Network engineer and Sexual Activity  . Alcohol use: Yes    Comment: occasional drink  . Drug use: No  . Sexual activity: Not on file  Lifestyle  . Physical activity:    Days per week: Not on file    Minutes per session: Not on file  . Stress: Not on file  Relationships  . Social connections:    Talks on phone: Not on file    Gets together: Not on file    Attends religious service: Not on file    Active member of club or organization: Not on file    Attends meetings of clubs or organizations: Not on file    Relationship status: Not on file  . Intimate partner violence:    Fear of current or ex partner: Not on file    Emotionally abused: Not on file    Physically abused: Not on file    Forced sexual activity: Not on file  Other Topics Concern  . Not on file  Social History Narrative  . Not on file     REVIEW OF SYSTEMS:   [X]  denotes positive finding, [ ]  denotes negative finding Cardiac  Comments:  Chest pain or chest pressure:    Shortness of breath upon exertion:    Short of breath when lying flat:    Irregular heart rhythm:        Vascular    Pain in calf, thigh, or hip brought on by ambulation:    Pain in feet at night that wakes you up from your sleep:     Blood clot in your veins:    Leg swelling:         Pulmonary    Oxygen at home:    Productive cough:     Wheezing:         Neurologic    Sudden weakness in arms or legs:     Sudden  numbness in arms or legs:     Sudden onset of difficulty speaking or slurred speech:    Temporary loss of vision in one eye:     Problems with dizziness:         Gastrointestinal    Blood in stool:     Vomited blood:         Genitourinary    Burning when urinating:     Blood in urine:        Psychiatric    Major depression:         Hematologic    Bleeding problems:    Problems with blood clotting too easily:        Skin    Rashes or ulcers:        Constitutional    Fever or chills:      PHYSICAL EXAMINATION:  Today's Vitals   03/28/18 1545  BP: 130/83  Pulse: 87  Resp: 18  Temp: (!) 96.9 F (36.1 C)  TempSrc: Oral  SpO2: 98%  Weight: 222 lb (100.7 kg)  Height: 5\' 11"  (1.803 m)   Body mass index is 30.96 kg/m.   General:  WDWN in NAD; vital signs documented above Gait: Not observed HENT: WNL, normocephalic Pulmonary: normal non-labored breathing , without Rales, rhonchi,  wheezing Cardiac: regular HR, without  Murmurs; without carotid bruits Abdomen: soft, NT, no masses Skin: without rashes; he does have areas of psoriasis  Vascular Exam/Pulses:  Right Left  Radial 2+ (normal) 2+ (normal)  Ulnar Unable to palpate  Unable to palpate   Femoral Unable to palpate  2+ (normal)  Popliteal Unable to palpate  Unable to palpate   DP Unable to palpate  2+ (normal)  PT 2+ (normal) 2+ (normal)   Extremities: without ischemic changes, without Gangrene , without cellulitis; without open wounds;  Musculoskeletal: no muscle wasting or atrophy  Neurologic: A&O X 3;  No focal weakness or paresthesias are detected Psychiatric:  The pt has Normal affect.   Non-Invasive Vascular Imaging:   ABI's/TBI's on 03/28/2018: Right:  1.01/0.84 Left:  1.26/0.83   Arterial duplex on 03/28/2018: Right: Near normal examination. Patent stent with no evidence of stenosis in the superficial femoral artery  Previous ABI's/TBI's on 09/13/17: Right:  1.10/0.85 Left:   1.32/0.89  Previous arterial duplex 09/13/17: Right: Near normal examination. Patent stent with no evidence of stenosis in the superficial femoral artery. Left: Limited study suggest no significant arterial occlusive disease.   Pt meds includes: Statin:  Yes.   Beta Blocker:  Yes.   Aspirin:  No. ACEI:  Yes.   ARB:  No. CCB use:  Yes Other Antiplatelet/Anticoagulant:  Yes Plavix   ASSESSMENT/PLAN:: 68 y.o. male here for follow up for PAD with hx of right SFA stent September 2012 and atherectomy with drug coated balloon angioplasty for in stent stenosis within right SFA for in stent stenosis in July 2018   -pt doing well without claudication sx.  His stent is patent and he has palpable pedal pulses.  I am unable to palpate his right femoral pulse.  Pt states it has been difficult to palpate in the past. His ABI's are normal.  Reviewed his duplex with Dr. Trula Slade and his right femoral artery is calcified but there is no stenosis  -He does have right hip pain but was recently diagnosed with bulging of the disc at L5-S1 that is much worse on the right.  He stretches before he walks, which helps with his right hip pain.  -will have him return in 6 months with repeat ABI's and arterial duplex. -he will call sooner should he need [place] sooner. -recently diagnosed with prostate cancer and will be starting radiation tx in the near future.  Leontine Locket, PA-C [facility] [phone]  Clinic MD:   Trula Slade

## 2018-03-29 ENCOUNTER — Ambulatory Visit
Admission: RE | Admit: 2018-03-29 | Discharge: 2018-03-29 | Disposition: A | Discharge status: Home / Self Care | Payer: Medicare Other | Source: Ambulatory Visit | Attending: Radiation Oncology | Admitting: Radiation Oncology

## 2018-03-29 ENCOUNTER — Other Ambulatory Visit: Payer: Self-pay

## 2018-03-29 DIAGNOSIS — C61 Malignant neoplasm of prostate: Secondary | ICD-10-CM | POA: Insufficient documentation

## 2018-03-29 DIAGNOSIS — Z510 Encounter for antineoplastic radiation therapy: Secondary | ICD-10-CM | POA: Insufficient documentation

## 2018-03-29 DIAGNOSIS — R9721 Rising PSA following treatment for malignant neoplasm of prostate: Secondary | ICD-10-CM | POA: Diagnosis not present

## 2018-03-29 DIAGNOSIS — Z79818 Long term (current) use of other agents affecting estrogen receptors and estrogen levels: Secondary | ICD-10-CM | POA: Diagnosis not present

## 2018-03-29 NOTE — Progress Notes (Signed)
Radiation Oncology         [phone] ________________________________  Initial Outpatient Consultation  Name: Michael [name] MRN: [id number]  Date: 03/29/2018  DOB: 1950-03-30  CC:Rolene Course, PA-C  Thea Silversmith, MD   REFERRING PHYSICIAN: Thea Silversmith, MD  DIAGNOSIS: 68 y.o. gentleman with Stage T2a adenocarcinoma of the prostate with Gleason score of 3+4, and PSA of 7.54.    ICD-10-CM   1. Malignant neoplasm of prostate ([place]) C61     HISTORY OF PRESENT ILLNESS: Michael [name] is a 68 y.o. male with a diagnosis of prostate cancer. He is an established patient of urologist, Dr. Estill Dooms, for elevated PSA and worsening urinary symptoms. He was noted to have an elevated PSA of 7.54 and abnormal digital rectal exam in October 2019.  Accordingly, the patient proceeded to transrectal ultrasound with 12 biopsies of the prostate on 01/12/2018.  The prostate volume measured 42 cc. Out of 12 core biopsies, 6 were positive, all on the left.  The maximum Gleason score was 3+4, and this was seen in the left apex, left base, left lateral apex, and left lateral mid.  Additionally, there was Gleason 3+3 disease seen in the left mid and left lateral base.   The patient reviewed the biopsy results with his urologist and was referred to Dr. Pablo Ledger, radiation oncology in [facility], for discussion of potential radiation treatment options.  The recommendation was to proceed with a 5.5 week course of prostate IMRT in combination with ST-ADT.  The patient is interested in proceeding with radiotherapy for management of his prostate cancer, however, would like to have his daily radiation treatments in [place] since this is closer to home in [place]. Multiple attempts were made at his urology office for fiducial marker placement but this was canceled each time due to UTIs. He did receive his first Lupron injection on 02/14/2018.   He has been kindly referred to [place] today to discuss the  potential for daily radiation treatments here in [place] for convenience.  PREVIOUS RADIATION THERAPY: No  PAST MEDICAL HISTORY:  Past Medical History:  Diagnosis Date  . Allergy   . Anxiety   . Diabetes mellitus   . Hyperlipidemia   . Hypertension   . PAD (peripheral artery disease) ([place])    a. s/p prior LE stenting 2012, 03/2017 - followed by [facility].  . Prostate cancer ([place])       PAST SURGICAL HISTORY: Past Surgical History:  Procedure Laterality Date  . ABDOMINAL AORTOGRAM N/A 09/01/2016   Procedure: Abdominal Aortogram;  Surgeon: Serafina Mitchell, MD;  Location: [place] [facility];  Service: Cardiovascular;  Laterality: N/A;  . ABDOMINAL AORTOGRAM W/LOWER EXTREMITY N/A 03/16/2017   Procedure: ABDOMINAL AORTOGRAM W/LOWER EXTREMITY;  Surgeon: Serafina Mitchell, MD;  Location: [place] [facility];  Service: Cardiovascular;  Laterality: N/A;  . ANGIOPLASTY / STENTING FEMORAL  11/05/10   Left SFA stent  . KNEE ARTHROSCOPY    . LOWER EXTREMITY ANGIOGRAPHY N/A 09/01/2016   Procedure: Lower Extremity Angiography;  Surgeon: Serafina Mitchell, MD;  Location: [facility] [place] [facility];  Service: Cardiovascular;  Laterality: N/A;  . PERIPHERAL VASCULAR ATHERECTOMY Right 09/01/2016   Procedure: Peripheral Vascular Atherectomy;  Surgeon: Serafina Mitchell, MD;  Location: [place];  Service: Cardiovascular;  Laterality: Right;  superficial femoral  . PERIPHERAL VASCULAR INTERVENTION Right 03/16/2017   Procedure: PERIPHERAL VASCULAR INTERVENTION;  Surgeon: Serafina Mitchell, MD;  Location: [facility];  Service: Cardiovascular;  Laterality:  Right;  superficicial femoral  . ROTATOR CUFF REPAIR     1990    FAMILY HISTORY:  Family History  Problem Relation Age of Onset  . Hyperlipidemia Mother   . Hypertension Mother   . Heart disease Father   . Deep vein thrombosis Father   . Hyperlipidemia Father   . Hypertension Father   . Heart attack Father   . Peripheral vascular disease Father    . Cancer Sister     SOCIAL HISTORY:  Social History   Socioeconomic History  . Marital status: Married    Spouse name: Not on file  . Number of children: Not on file  . Years of education: Not on file  . Highest education level: Not on file  Occupational History  . Not on file  Social Needs  . Financial resource strain: Not on file  . Food insecurity:    Worry: Not on file    Inability: Not on file  . Transportation needs:    Medical: Not on file    Non-medical: Not on file  Tobacco Use  . Smoking status: Never Smoker  . Smokeless tobacco: Former Network engineer and Sexual Activity  . Alcohol use: Yes    Comment: occasional drink  . Drug use: No  . Sexual activity: Not on file  Lifestyle  . Physical activity:    Days per week: Not on file    Minutes per session: Not on file  . Stress: Not on file  Relationships  . Social connections:    Talks on phone: Not on file    Gets together: Not on file    Attends religious service: Not on file    Active member of club or organization: Not on file    Attends meetings of clubs or organizations: Not on file    Relationship status: Not on file  . Intimate partner violence:    Fear of current or ex partner: Not on file    Emotionally abused: Not on file    Physically abused: Not on file    Forced sexual activity: Not on file  Other Topics Concern  . Not on file  Social History Narrative  . Not on file  Accompanied today by wife, Marliss Coots.  ALLERGIES: Demerol; Lactose intolerance (gi); Penicillins; Actos [pioglitazone]; Exenatide; Glimepiride; Nabumetone; Other; and Oxycodone  MEDICATIONS:  Current Outpatient Medications  Medication Sig Dispense Refill  . ACCU-CHEK AVIVA PLUS test strip AS DIRECTED SEVEN TIMES A DAY IN VITRO 90 DAYS  4  . amLODipine (NORVASC) 5 MG tablet Take 1 tablet (5 mg total) by mouth daily. 90 tablet 3  . atorvastatin (LIPITOR) 40 MG tablet Take 40 mg by mouth at bedtime.  5  . benazepril  (LOTENSIN) 40 MG tablet Take 40 mg by mouth daily.      . clonazePAM (KLONOPIN) 0.5 MG tablet TAKE HALF TABLET DAILY AS NEEDED FOR SLEEP  5  . clopidogrel (PLAVIX) 75 MG tablet TAKE 1 TABLET (75 MG TOTAL) BY MOUTH DAILY WITH BREAKFAST. 90 tablet 3  . fexofenadine (ALLEGRA) 180 MG tablet Take 180 mg by mouth daily.    . fluticasone (FLONASE) 50 MCG/ACT nasal spray Place 2 sprays into both nostrils at bedtime as needed (before uses CPAP if needed).     . furosemide (LASIX) 20 MG tablet Take 1 tablet (20 mg total) by mouth every other day. 15 tablet 11  . Homeopathic Products (PSORIASIS/ECZEMA RELIEF EX) Apply 1 application topically daily.    Marland Kitchen  ibuprofen (ADVIL,MOTRIN) 200 MG tablet Take 800 mg by mouth daily as needed for mild pain.     Marland Kitchen insulin glargine (LANTUS) 100 UNIT/ML injection Inject 24 Units into the skin at bedtime.     . metoprolol succinate (TOPROL-XL) 100 MG 24 hr tablet Take 100 mg by mouth daily.     Marland Kitchen NOVOLOG FLEXPEN 100 UNIT/ML FlexPen USE 18 UNITS IF BLOOD SUGAR 86 TO 126, THEN 1 UNIT FOR EVERY 20MG /DL ABOVE GOAL. 3 TIMES DAILY WITH MEALS  2  . potassium chloride (K-DUR) 10 MEQ tablet Take 2 tablets (20 mEq total) by mouth every other day. With Lasix 90 tablet 3  . tamsulosin (FLOMAX) 0.4 MG CAPS capsule TAKE 1 CAPSULE BY MOUTH EVERY DAY AFTER DINNER  5   No current facility-administered medications for this encounter.     REVIEW OF SYSTEMS:  On review of systems, the patient reports that he is doing well overall. He denies any chest pain, shortness of breath, cough, fevers, chills, or night sweats. He reports a 15 pound weight loss since starting 2 new medications for management of his Type II DM which cause decreased appetite and weight loss. He reports hot flashes and increased fatigue since receiving Lupron injection. He denies any bowel disturbances, and denies abdominal pain, nausea or vomiting. He denies any new musculoskeletal or joint aches or pains, but reports right  pelvic pain that radiates to the left thigh and cannot walk long distances without cramping up. He has known DDD and HNP in the lumbar spine which is managed by Dr. Paralee Cancel.  He takes clonazepam 0.5 mg for the pain if it gets severe. His IPSS was 14, indicating moderate urinary symptoms. He reports nocturia has improved dramatically, from 7-8 times/night to 0-1 times/night, since regulating blood sugar levels. He reports incomplete emptying and increased urinary frequency. He was taking a medication for OAB but this caused severe dry mouth so he discontinued this 1 day ago.  His SHIM was 5, indicating he does have severe erectile dysfunction but states he has not been sexually active in 3 years. A complete review of systems is obtained and is otherwise negative.  PHYSICAL EXAM:  Wt Readings from Last 3 Encounters:  03/29/18 224 lb (101.6 kg)  03/28/18 222 lb (100.7 kg)  11/10/17 237 lb 6.4 oz (107.7 kg)   Temp Readings from Last 3 Encounters:  03/29/18 98.6 F (37 C) (Oral)  03/28/18 (!) 96.9 F (36.1 C) (Oral)  09/13/17 97.7 F (36.5 C) (Oral)   BP Readings from Last 3 Encounters:  03/29/18 (!) 157/88  03/28/18 130/83  11/10/17 138/78   Pulse Readings from Last 3 Encounters:  03/29/18 (!) 116  03/28/18 87  11/10/17 73    In general this is a well appearing caucasian male in no acute distress. He is alert and oriented x4 and appropriate throughout the examination. HEENT reveals that the patient is normocephalic, atraumatic. EOMs are intact. PERRLA. Skin is intact without any evidence of gross lesions. Cardiovascular exam reveals a regular rate and rhythm, no clicks rubs or murmurs are auscultated. Chest is clear to auscultation bilaterally. Lymphatic assessment is performed and does not reveal any adenopathy in the cervical, supraclavicular, axillary, or inguinal chains. Abdomen has active bowel sounds in all quadrants and is intact. The abdomen is soft, non tender, non distended.  Lower extremities are negative for pretibial pitting edema, deep calf tenderness, cyanosis or clubbing.   KPS = 90  100 - Normal; no complaints; no  evidence of disease. 90   - Able to carry on normal activity; minor signs or symptoms of disease. 80   - Normal activity with effort; some signs or symptoms of disease. 70   - Cares for self; unable to carry on normal activity or to do active work. 60   - Requires occasional assistance, but is able to care for most of his personal needs. 50   - Requires considerable assistance and frequent medical care. 66   - Disabled; requires special care and assistance. 36   - Severely disabled; hospital admission is indicated although death not imminent. 51   - Very sick; hospital admission necessary; active supportive treatment necessary. 10   - Moribund; fatal processes progressing rapidly. 0     - Dead  Karnofsky DA, Abelmann [place], Craver LS and Burchenal [facility] [phone]) The use of the nitrogen mustards in the palliative treatment of carcinoma: with particular reference to bronchogenic carcinoma Cancer 1 634-56  LABORATORY DATA:  Lab Results  Component Value Date   HGB 14.6 03/16/2017   HCT 43.0 03/16/2017   Lab Results  Component Value Date   NA 140 12/02/2017   K 4.3 12/02/2017   CL 101 12/02/2017   CO2 23 12/02/2017   No results found for: ALT, AST, GGT, ALKPHOS, BILITOT   RADIOGRAPHY: Vas [place] Abi With/wo Tbi  Result Date: 03/28/2018 LOWER EXTREMITY DOPPLER STUDY Indications: Peripheral vascular disease. High Risk Factors: Hypertension, hyperlipidemia, Diabetes, no history of                    smoking.  Vascular Interventions: Right SFA stent placed 11/05/2010 with subsequent                         atherectomy and drug coated PTA 09/01/2016. Second stent                         placed in SFA 03/16/2017. Performing Technologist: Burley Saver RVT  Examination Guidelines: A complete evaluation includes at minimum, Doppler waveform signals and systolic  blood pressure reading at the level of bilateral brachial, anterior tibial, and posterior tibial arteries, when vessel segments are accessible. Bilateral testing is considered an integral part of a complete examination. Photoelectric Plethysmograph (PPG) waveforms and toe systolic pressure readings are included as required and additional duplex testing as needed. Limited examinations for reoccurring indications may be performed as noted.  ABI Findings: +---------+------------------+-----+---------+--------+ Right    Rt Pressure (mmHg)IndexWaveform Comment  +---------+------------------+-----+---------+--------+ Brachial 141                                      +---------+------------------+-----+---------+--------+ PTA      143               1.01 triphasic         +---------+------------------+-----+---------+--------+ DP       130               0.92 triphasic         +---------+------------------+-----+---------+--------+ Great Toe118               0.84                   +---------+------------------+-----+---------+--------+ +---------+------------------+-----+---------+-------+ Left     Lt Pressure (mmHg)IndexWaveform Comment +---------+------------------+-----+---------+-------+ Brachial 130                                     +---------+------------------+-----+---------+-------+  PTA      177               1.26 triphasic        +---------+------------------+-----+---------+-------+ DP       152               1.08 triphasic        +---------+------------------+-----+---------+-------+ Great Toe117               0.83                  +---------+------------------+-----+---------+-------+ +-------+-----------+-----------+------------+------------+ ABI/TBIToday's ABIToday's TBIPrevious ABIPrevious TBI +-------+-----------+-----------+------------+------------+ Right  1.01       0.84       1.10        0.85          +-------+-----------+-----------+------------+------------+ Left   1.26       0.83       1.32        0.89         +-------+-----------+-----------+------------+------------+ Bilateral ABIs and TBIs appear essentially unchanged compared to prior study on 09/13/2017.  Summary: Right: Resting right ankle-brachial index is within normal range. No evidence of significant right lower extremity arterial disease. The right toe-brachial index is normal. RT great toe pressure = 118 mmHg. Left: Resting left ankle-brachial index is within normal range. No evidence of significant left lower extremity arterial disease. The left toe-brachial index is normal. LT Great toe pressure = 117 mmHg.  *See table(s) above for measurements and observations.  Electronically signed by Harold Barban MD on 03/28/2018 at 4:17:16 PM.    Final    Vas [place] Lower Extremity Arterial Duplex  Result Date: 03/28/2018 LOWER EXTREMITY ARTERIAL DUPLEX STUDY Indications: Peripheral vascular disease. High Risk Factors: Hypertension, hyperlipidemia, Diabetes, past history of                    smoking.  Vascular Interventions: Right SFA stent placed 11/05/2010 with subsequent                         atherectomy and drug coated PTA 09/01/2016. Second stent                         placed in SFA 03/16/2017. Current ABI:            R=1.01/0.84, L=1.26/0.83 Comparison Study: No significant change when compared to previous study on                   09/13/2017. Performing Technologist: Burley Saver RVT  Examination Guidelines: A complete evaluation includes B-mode imaging, spectral Doppler, color Doppler, and power Doppler as needed of all accessible portions of each vessel. Bilateral testing is considered an integral part of a complete examination. Limited examinations for reoccurring indications may be performed as noted.  Right Duplex Findings: +-----------+--------+-----+--------+---------+----------------+            PSV cm/sRatioStenosisWaveform  Comments         +-----------+--------+-----+--------+---------+----------------+ CFA Distal 142                  triphasic                 +-----------+--------+-----+--------+---------+----------------+ DFA        70                   triphasic                 +-----------+--------+-----+--------+---------+----------------+  SFA Prox   117                  triphasic                 +-----------+--------+-----+--------+---------+----------------+ SFA Mid    81                   triphasic                 +-----------+--------+-----+--------+---------+----------------+ SFA Distal 70                   triphasic                 +-----------+--------+-----+--------+---------+----------------+ POP Prox   69                   triphasic                 +-----------+--------+-----+--------+---------+----------------+ POP Distal 56                   triphasic                 +-----------+--------+-----+--------+---------+----------------+ ATA Distal 127                  triphasiccalcified plaque +-----------+--------+-----+--------+---------+----------------+ PTA Distal 46                   triphasic                 +-----------+--------+-----+--------+---------+----------------+ PERO Distal69                   triphasic                 +-----------+--------+-----+--------+---------+----------------+  Right Stent(s): +---------------+--------+--------+---------+--------+ prox to mid SFAPSV cm/sStenosisWaveform Comments +---------------+--------+--------+---------+--------+ Prox to Stent  125             triphasic         +---------------+--------+--------+---------+--------+ Proximal Stent 125             triphasic         +---------------+--------+--------+---------+--------+ Mid Stent      115             triphasic         +---------------+--------+--------+---------+--------+ Distal Stent   101             triphasic          +---------------+--------+--------+---------+--------+ Distal to Stent84              triphasic         +---------------+--------+--------+---------+--------+  Summary: Right: Near normal examination. Patent stent with no evidence of stenosis in the superficial femoral artery.  See table(s) above for measurements and observations. Electronically signed by Harold Barban MD on 03/28/2018 at 4:43:20 PM.    Final       IMPRESSION/PLAN: 1. 68 y.o. gentleman with Stage T2a adenocarcinoma of the prostate with Gleason Score of 3+4, and PSA of 7.54. We discussed the patient's workup and outlined the nature of prostate cancer in this setting. The patient's T stage, Gleason's score (50% of cores with 3+4), and PSA put him into the unfavorable intermediate risk group. Accordingly, he is eligible for ST-ADT in combination with either brachytherapy or 5.5 weeks of daily external radiation. We discussed the available radiation techniques, and focused on the details of logistics and delivery. We discussed and outlined the risks, benefits, short and long-term effects associated with radiotherapy and  compared and contrasted these with prostatectomy. We also detailed the role of ADT in the treatment of unfavorable intermediate-risk prostate cancer and outlined the associated side effects that could be expected with this therapy. All questions were answered to the patient's satisfaction.   At the conclusion of our conversation the patient elects to proceed with ST-ADT in combination with 5.5 weeks of daily prostate IMRT. He has received his first Lupron injection on 02/14/2018.  He has freely signed written consent to proceed and a copy of this document was placed in the chart, a copy was also provided to the patient. He is scheduled for CT simulation/treatment planning on Wednesday January 29th at 10:30 AM in anticipation of beginning IMRT in the near future.  We spent 60 minutes face to face with the patient and more  than 50% of that time was spent in counseling and/or coordination of care.    Nicholos Johns, PA-C    Tyler Pita, MD  [facility] Direct Dial: [phone]  Fax: [phone] [email or web address]  Skype  [email or web address]  This document serves as a record of services personally performed by Tyler Pita, MD and Freeman Caldron, PA-C. It was created on their behalf by Rae Lips, a trained medical scribe. The creation of this record is based on the scribe's personal observations and the providers' statements to them. This document has been checked and approved by the attending providers.

## 2018-03-30 ENCOUNTER — Ambulatory Visit
Admission: RE | Admit: 2018-03-30 | Discharge: 2018-03-30 | Disposition: A | Discharge status: Home / Self Care | Payer: Medicare Other | Source: Ambulatory Visit | Attending: Radiation Oncology | Admitting: Radiation Oncology

## 2018-03-30 DIAGNOSIS — C61 Malignant neoplasm of prostate: Secondary | ICD-10-CM | POA: Diagnosis not present

## 2018-03-30 DIAGNOSIS — Z510 Encounter for antineoplastic radiation therapy: Secondary | ICD-10-CM | POA: Diagnosis not present

## 2018-03-31 NOTE — Progress Notes (Signed)
  Radiation Oncology         (336) [phone] ________________________________  Name: Michael [name] MRN: [id number]  Date: 03/30/2018  DOB: Jun 27, 1950  SIMULATION AND TREATMENT PLANNING NOTE    ICD-10-CM   1. Malignant neoplasm of prostate ([place]) C61     DIAGNOSIS:  68 y.o. gentleman with Stage T2a adenocarcinoma of the prostate with Gleason score of 3+4, and PSA of 7.54  NARRATIVE:  The patient was brought to the [facility].  Identity was confirmed.  All relevant records and images related to the planned course of therapy were reviewed.  The patient freely provided informed written consent to proceed with treatment after reviewing the details related to the planned course of therapy. The consent form was witnessed and verified by the simulation staff.  Then, the patient was set-up in a stable reproducible supine position for radiation therapy.  A vacuum lock pillow device was custom fabricated to position his legs in a reproducible immobilized position.  Then, I performed a urethrogram under sterile conditions to identify the prostatic apex.  CT images were obtained.  Surface markings were placed.  The CT images were loaded into the planning software.  Then the prostate target and avoidance structures including the rectum, bladder, bowel and hips were contoured.  Treatment planning then occurred.  The radiation prescription was entered and confirmed.  A total of one complex treatment devices was fabricated. I have requested : Intensity Modulated Radiotherapy (IMRT) is medically necessary for this case for the following reason:  Rectal sparing.Marland Kitchen  PLAN:  The patient will receive 70 Gy in 28 fractions.  ________________________________  Sheral Apley Tammi Klippel, M.D.

## 2018-04-01 ENCOUNTER — Encounter: Payer: Self-pay | Admitting: Medical Oncology

## 2018-04-08 DIAGNOSIS — C61 Malignant neoplasm of prostate: Secondary | ICD-10-CM | POA: Diagnosis not present

## 2018-04-08 DIAGNOSIS — Z510 Encounter for antineoplastic radiation therapy: Secondary | ICD-10-CM | POA: Diagnosis not present

## 2018-04-11 ENCOUNTER — Encounter: Payer: Self-pay | Admitting: Medical Oncology

## 2018-04-11 ENCOUNTER — Ambulatory Visit
Admission: RE | Admit: 2018-04-11 | Discharge: 2018-04-11 | Disposition: A | Discharge status: Home / Self Care | Payer: Medicare Other | Source: Ambulatory Visit | Attending: Radiation Oncology | Admitting: Radiation Oncology

## 2018-04-11 DIAGNOSIS — C61 Malignant neoplasm of prostate: Secondary | ICD-10-CM | POA: Diagnosis not present

## 2018-04-11 DIAGNOSIS — Z510 Encounter for antineoplastic radiation therapy: Secondary | ICD-10-CM | POA: Diagnosis not present

## 2018-04-12 ENCOUNTER — Ambulatory Visit
Admission: RE | Admit: 2018-04-12 | Discharge: 2018-04-12 | Disposition: A | Discharge status: Home / Self Care | Payer: Medicare Other | Source: Ambulatory Visit | Attending: Radiation Oncology | Admitting: Radiation Oncology

## 2018-04-12 DIAGNOSIS — C61 Malignant neoplasm of prostate: Secondary | ICD-10-CM | POA: Diagnosis not present

## 2018-04-12 DIAGNOSIS — Z510 Encounter for antineoplastic radiation therapy: Secondary | ICD-10-CM | POA: Diagnosis not present

## 2018-04-13 ENCOUNTER — Ambulatory Visit
Admission: RE | Admit: 2018-04-13 | Discharge: 2018-04-13 | Disposition: A | Discharge status: Home / Self Care | Payer: Medicare Other | Source: Ambulatory Visit | Attending: Radiation Oncology | Admitting: Radiation Oncology

## 2018-04-13 DIAGNOSIS — C61 Malignant neoplasm of prostate: Secondary | ICD-10-CM | POA: Diagnosis not present

## 2018-04-13 DIAGNOSIS — Z510 Encounter for antineoplastic radiation therapy: Secondary | ICD-10-CM | POA: Diagnosis not present

## 2018-04-14 ENCOUNTER — Ambulatory Visit
Admission: RE | Admit: 2018-04-14 | Discharge: 2018-04-14 | Disposition: A | Discharge status: Home / Self Care | Payer: Medicare Other | Source: Ambulatory Visit | Attending: Radiation Oncology | Admitting: Radiation Oncology

## 2018-04-14 DIAGNOSIS — C61 Malignant neoplasm of prostate: Secondary | ICD-10-CM | POA: Diagnosis not present

## 2018-04-14 DIAGNOSIS — Z510 Encounter for antineoplastic radiation therapy: Secondary | ICD-10-CM | POA: Diagnosis not present

## 2018-04-15 ENCOUNTER — Ambulatory Visit
Admission: RE | Admit: 2018-04-15 | Discharge: 2018-04-15 | Disposition: A | Discharge status: Home / Self Care | Payer: Medicare Other | Source: Ambulatory Visit | Attending: Radiation Oncology | Admitting: Radiation Oncology

## 2018-04-15 DIAGNOSIS — Z510 Encounter for antineoplastic radiation therapy: Secondary | ICD-10-CM | POA: Diagnosis not present

## 2018-04-15 DIAGNOSIS — C61 Malignant neoplasm of prostate: Secondary | ICD-10-CM | POA: Diagnosis not present

## 2018-04-18 ENCOUNTER — Ambulatory Visit
Admission: RE | Admit: 2018-04-18 | Discharge: 2018-04-18 | Disposition: A | Discharge status: Home / Self Care | Payer: Medicare Other | Source: Ambulatory Visit | Attending: Radiation Oncology | Admitting: Radiation Oncology

## 2018-04-18 DIAGNOSIS — C61 Malignant neoplasm of prostate: Secondary | ICD-10-CM | POA: Diagnosis not present

## 2018-04-18 DIAGNOSIS — Z510 Encounter for antineoplastic radiation therapy: Secondary | ICD-10-CM | POA: Diagnosis not present

## 2018-04-19 ENCOUNTER — Ambulatory Visit
Admission: RE | Admit: 2018-04-19 | Discharge: 2018-04-19 | Disposition: A | Discharge status: Home / Self Care | Payer: Medicare Other | Source: Ambulatory Visit | Attending: Radiation Oncology | Admitting: Radiation Oncology

## 2018-04-19 DIAGNOSIS — Z510 Encounter for antineoplastic radiation therapy: Secondary | ICD-10-CM | POA: Diagnosis not present

## 2018-04-19 DIAGNOSIS — C61 Malignant neoplasm of prostate: Secondary | ICD-10-CM | POA: Diagnosis not present

## 2018-04-20 ENCOUNTER — Ambulatory Visit
Admission: RE | Admit: 2018-04-20 | Discharge: 2018-04-20 | Disposition: A | Discharge status: Home / Self Care | Payer: Medicare Other | Source: Ambulatory Visit | Attending: Radiation Oncology | Admitting: Radiation Oncology

## 2018-04-20 DIAGNOSIS — C61 Malignant neoplasm of prostate: Secondary | ICD-10-CM | POA: Diagnosis not present

## 2018-04-20 DIAGNOSIS — Z510 Encounter for antineoplastic radiation therapy: Secondary | ICD-10-CM | POA: Diagnosis not present

## 2018-04-21 ENCOUNTER — Ambulatory Visit
Admission: RE | Admit: 2018-04-21 | Discharge: 2018-04-21 | Disposition: A | Discharge status: Home / Self Care | Payer: Medicare Other | Source: Ambulatory Visit | Attending: Radiation Oncology | Admitting: Radiation Oncology

## 2018-04-21 DIAGNOSIS — C61 Malignant neoplasm of prostate: Secondary | ICD-10-CM | POA: Diagnosis not present

## 2018-04-21 DIAGNOSIS — Z510 Encounter for antineoplastic radiation therapy: Secondary | ICD-10-CM | POA: Diagnosis not present

## 2018-04-22 ENCOUNTER — Ambulatory Visit
Admission: RE | Admit: 2018-04-22 | Discharge: 2018-04-22 | Disposition: A | Discharge status: Home / Self Care | Payer: Medicare Other | Source: Ambulatory Visit | Attending: Radiation Oncology | Admitting: Radiation Oncology

## 2018-04-22 DIAGNOSIS — Z510 Encounter for antineoplastic radiation therapy: Secondary | ICD-10-CM | POA: Diagnosis not present

## 2018-04-22 DIAGNOSIS — C61 Malignant neoplasm of prostate: Secondary | ICD-10-CM | POA: Diagnosis not present

## 2018-04-25 ENCOUNTER — Ambulatory Visit
Admission: RE | Admit: 2018-04-25 | Discharge: 2018-04-25 | Disposition: A | Discharge status: Home / Self Care | Payer: Medicare Other | Source: Ambulatory Visit | Attending: Radiation Oncology | Admitting: Radiation Oncology

## 2018-04-25 DIAGNOSIS — C61 Malignant neoplasm of prostate: Secondary | ICD-10-CM | POA: Diagnosis not present

## 2018-04-25 DIAGNOSIS — Z510 Encounter for antineoplastic radiation therapy: Secondary | ICD-10-CM | POA: Diagnosis not present

## 2018-04-26 ENCOUNTER — Ambulatory Visit
Admission: RE | Admit: 2018-04-26 | Discharge: 2018-04-26 | Disposition: A | Discharge status: Home / Self Care | Payer: Medicare Other | Source: Ambulatory Visit | Attending: Radiation Oncology | Admitting: Radiation Oncology

## 2018-04-26 DIAGNOSIS — C61 Malignant neoplasm of prostate: Secondary | ICD-10-CM | POA: Diagnosis not present

## 2018-04-26 DIAGNOSIS — Z510 Encounter for antineoplastic radiation therapy: Secondary | ICD-10-CM | POA: Diagnosis not present

## 2018-04-27 ENCOUNTER — Ambulatory Visit
Admission: RE | Admit: 2018-04-27 | Discharge: 2018-04-27 | Disposition: A | Discharge status: Home / Self Care | Payer: Medicare Other | Source: Ambulatory Visit | Attending: Radiation Oncology | Admitting: Radiation Oncology

## 2018-04-27 DIAGNOSIS — Z510 Encounter for antineoplastic radiation therapy: Secondary | ICD-10-CM | POA: Diagnosis not present

## 2018-04-27 DIAGNOSIS — C61 Malignant neoplasm of prostate: Secondary | ICD-10-CM | POA: Diagnosis not present

## 2018-04-28 ENCOUNTER — Ambulatory Visit
Admission: RE | Admit: 2018-04-28 | Discharge: 2018-04-28 | Disposition: A | Discharge status: Home / Self Care | Payer: Medicare Other | Source: Ambulatory Visit | Attending: Radiation Oncology | Admitting: Radiation Oncology

## 2018-04-28 DIAGNOSIS — C61 Malignant neoplasm of prostate: Secondary | ICD-10-CM | POA: Diagnosis not present

## 2018-04-28 DIAGNOSIS — Z510 Encounter for antineoplastic radiation therapy: Secondary | ICD-10-CM | POA: Diagnosis not present

## 2018-04-29 ENCOUNTER — Ambulatory Visit
Admission: RE | Admit: 2018-04-29 | Discharge: 2018-04-29 | Disposition: A | Discharge status: Home / Self Care | Payer: Medicare Other | Source: Ambulatory Visit | Attending: Radiation Oncology | Admitting: Radiation Oncology

## 2018-04-29 DIAGNOSIS — C61 Malignant neoplasm of prostate: Secondary | ICD-10-CM | POA: Diagnosis not present

## 2018-04-29 DIAGNOSIS — Z510 Encounter for antineoplastic radiation therapy: Secondary | ICD-10-CM | POA: Diagnosis not present

## 2018-05-02 ENCOUNTER — Ambulatory Visit
Admission: RE | Admit: 2018-05-02 | Discharge: 2018-05-02 | Disposition: A | Discharge status: Home / Self Care | Payer: Medicare Other | Source: Ambulatory Visit | Attending: Radiation Oncology | Admitting: Radiation Oncology

## 2018-05-02 DIAGNOSIS — Z510 Encounter for antineoplastic radiation therapy: Secondary | ICD-10-CM | POA: Diagnosis not present

## 2018-05-02 DIAGNOSIS — C61 Malignant neoplasm of prostate: Secondary | ICD-10-CM | POA: Insufficient documentation

## 2018-05-03 ENCOUNTER — Ambulatory Visit
Admission: RE | Admit: 2018-05-03 | Discharge: 2018-05-03 | Disposition: A | Discharge status: Home / Self Care | Payer: Medicare Other | Source: Ambulatory Visit | Attending: Radiation Oncology | Admitting: Radiation Oncology

## 2018-05-03 DIAGNOSIS — Z510 Encounter for antineoplastic radiation therapy: Secondary | ICD-10-CM | POA: Diagnosis not present

## 2018-05-03 DIAGNOSIS — C61 Malignant neoplasm of prostate: Secondary | ICD-10-CM | POA: Diagnosis not present

## 2018-05-04 ENCOUNTER — Ambulatory Visit
Admission: RE | Admit: 2018-05-04 | Discharge: 2018-05-04 | Disposition: A | Discharge status: Home / Self Care | Payer: Medicare Other | Source: Ambulatory Visit | Attending: Radiation Oncology | Admitting: Radiation Oncology

## 2018-05-04 DIAGNOSIS — Z510 Encounter for antineoplastic radiation therapy: Secondary | ICD-10-CM | POA: Diagnosis not present

## 2018-05-04 DIAGNOSIS — C61 Malignant neoplasm of prostate: Secondary | ICD-10-CM | POA: Diagnosis not present

## 2018-05-05 ENCOUNTER — Ambulatory Visit
Admission: RE | Admit: 2018-05-05 | Discharge: 2018-05-05 | Disposition: A | Discharge status: Home / Self Care | Payer: Medicare Other | Source: Ambulatory Visit | Attending: Radiation Oncology | Admitting: Radiation Oncology

## 2018-05-05 DIAGNOSIS — Z510 Encounter for antineoplastic radiation therapy: Secondary | ICD-10-CM | POA: Diagnosis not present

## 2018-05-05 DIAGNOSIS — C61 Malignant neoplasm of prostate: Secondary | ICD-10-CM | POA: Diagnosis not present

## 2018-05-06 ENCOUNTER — Encounter: Payer: Self-pay | Admitting: Medical Oncology

## 2018-05-06 ENCOUNTER — Ambulatory Visit
Admission: RE | Admit: 2018-05-06 | Discharge: 2018-05-06 | Disposition: A | Discharge status: Home / Self Care | Payer: Medicare Other | Source: Ambulatory Visit | Attending: Radiation Oncology | Admitting: Radiation Oncology

## 2018-05-06 DIAGNOSIS — Z510 Encounter for antineoplastic radiation therapy: Secondary | ICD-10-CM | POA: Diagnosis not present

## 2018-05-06 DIAGNOSIS — C61 Malignant neoplasm of prostate: Secondary | ICD-10-CM | POA: Diagnosis not present

## 2018-05-09 ENCOUNTER — Ambulatory Visit
Admission: RE | Admit: 2018-05-09 | Discharge: 2018-05-09 | Disposition: A | Discharge status: Home / Self Care | Payer: Medicare Other | Source: Ambulatory Visit | Attending: Radiation Oncology | Admitting: Radiation Oncology

## 2018-05-09 DIAGNOSIS — Z510 Encounter for antineoplastic radiation therapy: Secondary | ICD-10-CM | POA: Diagnosis not present

## 2018-05-09 DIAGNOSIS — C61 Malignant neoplasm of prostate: Secondary | ICD-10-CM | POA: Diagnosis not present

## 2018-05-10 ENCOUNTER — Ambulatory Visit
Admission: RE | Admit: 2018-05-10 | Discharge: 2018-05-10 | Disposition: A | Discharge status: Home / Self Care | Payer: Medicare Other | Source: Ambulatory Visit | Attending: Radiation Oncology | Admitting: Radiation Oncology

## 2018-05-10 DIAGNOSIS — C61 Malignant neoplasm of prostate: Secondary | ICD-10-CM | POA: Diagnosis not present

## 2018-05-10 DIAGNOSIS — Z510 Encounter for antineoplastic radiation therapy: Secondary | ICD-10-CM | POA: Diagnosis not present

## 2018-05-11 ENCOUNTER — Ambulatory Visit
Admission: RE | Admit: 2018-05-11 | Discharge: 2018-05-11 | Disposition: A | Discharge status: Home / Self Care | Payer: Medicare Other | Source: Ambulatory Visit | Attending: Radiation Oncology | Admitting: Radiation Oncology

## 2018-05-11 DIAGNOSIS — Z961 Presence of intraocular lens: Secondary | ICD-10-CM | POA: Diagnosis not present

## 2018-05-11 DIAGNOSIS — H40013 Open angle with borderline findings, low risk, bilateral: Secondary | ICD-10-CM | POA: Diagnosis not present

## 2018-05-11 DIAGNOSIS — E119 Type 2 diabetes mellitus without complications: Secondary | ICD-10-CM | POA: Diagnosis not present

## 2018-05-11 DIAGNOSIS — C61 Malignant neoplasm of prostate: Secondary | ICD-10-CM | POA: Diagnosis not present

## 2018-05-11 DIAGNOSIS — Z794 Long term (current) use of insulin: Secondary | ICD-10-CM | POA: Diagnosis not present

## 2018-05-11 DIAGNOSIS — Z510 Encounter for antineoplastic radiation therapy: Secondary | ICD-10-CM | POA: Diagnosis not present

## 2018-05-12 ENCOUNTER — Ambulatory Visit
Admission: RE | Admit: 2018-05-12 | Discharge: 2018-05-12 | Disposition: A | Discharge status: Home / Self Care | Payer: Medicare Other | Source: Ambulatory Visit | Attending: Radiation Oncology | Admitting: Radiation Oncology

## 2018-05-12 DIAGNOSIS — Z510 Encounter for antineoplastic radiation therapy: Secondary | ICD-10-CM | POA: Diagnosis not present

## 2018-05-12 DIAGNOSIS — C61 Malignant neoplasm of prostate: Secondary | ICD-10-CM | POA: Diagnosis not present

## 2018-05-13 ENCOUNTER — Other Ambulatory Visit: Payer: Self-pay

## 2018-05-13 ENCOUNTER — Telehealth: Payer: Self-pay | Admitting: *Deleted

## 2018-05-13 ENCOUNTER — Ambulatory Visit
Admission: RE | Admit: 2018-05-13 | Discharge: 2018-05-13 | Disposition: A | Discharge status: Home / Self Care | Payer: Medicare Other | Source: Ambulatory Visit | Attending: Radiation Oncology | Admitting: Radiation Oncology

## 2018-05-13 DIAGNOSIS — Z510 Encounter for antineoplastic radiation therapy: Secondary | ICD-10-CM | POA: Diagnosis not present

## 2018-05-13 DIAGNOSIS — C61 Malignant neoplasm of prostate: Secondary | ICD-10-CM | POA: Diagnosis not present

## 2018-05-13 NOTE — Telephone Encounter (Signed)
Patient called and stated he needs to discuss ongoing problem of "buttock pain". Increasing pain over the past 3 year. Pain worse after walking. He is finishing up Radiation for prostate cancer, has been evaluated for back pain, which he states doctors have told him has nothing to do with this problem. Saw PA 03/2018 and vascular studies were good. He claims blood flow to lower extremities is GREAT. He stated he wants someone to check groin pulses, and discuss possible cause for pain and treatment. Agreeable to see PA. Appt. Given.

## 2018-05-16 ENCOUNTER — Ambulatory Visit
Admission: RE | Admit: 2018-05-16 | Discharge: 2018-05-16 | Disposition: A | Discharge status: Home / Self Care | Payer: Medicare Other | Source: Ambulatory Visit | Attending: Radiation Oncology | Admitting: Radiation Oncology

## 2018-05-16 ENCOUNTER — Other Ambulatory Visit: Payer: Self-pay

## 2018-05-16 DIAGNOSIS — C61 Malignant neoplasm of prostate: Secondary | ICD-10-CM | POA: Diagnosis not present

## 2018-05-16 DIAGNOSIS — Z510 Encounter for antineoplastic radiation therapy: Secondary | ICD-10-CM | POA: Diagnosis not present

## 2018-05-17 ENCOUNTER — Ambulatory Visit
Admission: RE | Admit: 2018-05-17 | Discharge: 2018-05-17 | Disposition: A | Discharge status: Home / Self Care | Payer: Medicare Other | Source: Ambulatory Visit | Attending: Radiation Oncology | Admitting: Radiation Oncology

## 2018-05-17 ENCOUNTER — Other Ambulatory Visit: Payer: Self-pay

## 2018-05-17 DIAGNOSIS — C61 Malignant neoplasm of prostate: Secondary | ICD-10-CM | POA: Diagnosis not present

## 2018-05-17 DIAGNOSIS — Z510 Encounter for antineoplastic radiation therapy: Secondary | ICD-10-CM | POA: Diagnosis not present

## 2018-05-18 ENCOUNTER — Ambulatory Visit
Admission: RE | Admit: 2018-05-18 | Discharge: 2018-05-18 | Disposition: A | Discharge status: Home / Self Care | Payer: Medicare Other | Source: Ambulatory Visit | Attending: Radiation Oncology | Admitting: Radiation Oncology

## 2018-05-18 ENCOUNTER — Other Ambulatory Visit: Payer: Self-pay

## 2018-05-18 ENCOUNTER — Ambulatory Visit: Payer: Self-pay

## 2018-05-18 ENCOUNTER — Encounter: Payer: Self-pay | Admitting: Radiation Oncology

## 2018-05-18 ENCOUNTER — Encounter: Payer: Self-pay | Admitting: Medical Oncology

## 2018-05-18 DIAGNOSIS — Z510 Encounter for antineoplastic radiation therapy: Secondary | ICD-10-CM | POA: Diagnosis not present

## 2018-05-18 DIAGNOSIS — C61 Malignant neoplasm of prostate: Secondary | ICD-10-CM | POA: Diagnosis not present

## 2018-05-26 ENCOUNTER — Telehealth: Payer: Self-pay | Admitting: *Deleted

## 2018-05-26 NOTE — Telephone Encounter (Signed)
CALLED PATIENT TO INFORM THAT ASHLYN BRUNING WILL BE CALLING HIM ON 06-15-18 @ 10 AM FOR FU DUE TO COVID 19, PATIENT VERIFIED UNDERSTANDING THIS

## 2018-05-27 ENCOUNTER — Encounter: Payer: Self-pay | Admitting: Radiation Oncology

## 2018-05-27 NOTE — Progress Notes (Signed)
  Radiation Oncology         [phone]) [phone] ________________________________  Name: Michael [name] MRN: [id number]  Date: 05/27/2018  DOB: Dec 30, 1950  End of Treatment Note  Diagnosis:   68 y.o. gentleman with Stage T2a adenocarcinoma of the prostate with Gleason score of 3+4, and PSA of 7.54     Indication for treatment:  Curative, Definitive Radiotherapy       Radiation treatment dates:   04/11/2018 - 05/18/2018  Site/dose:   The prostate was treated to 70 Gy in 28 fractions of 2.5 Gy  Beams/energy:   The patient was treated with IMRT using volumetric arc therapy delivering 6 MV X-rays to clockwise and counterclockwise circumferential arcs with a 90 degree collimator offset to avoid dose scalloping.  Image guidance was performed with daily cone beam CT prior to each fraction to align to gold markers in the prostate and assure proper bladder and rectal fill volumes.  Immobilization was achieved with BodyFix custom mold.  Narrative: The patient tolerated radiation treatment relatively well. He reported nocturia x2 and mild fatigue as well as difficulty emptying his bladder, dysuria, leakage, urgency, constipation, and urinary hesitancy. He denied hematuria throughout treatment.  Plan: The patient has completed radiation treatment. He will return to radiation oncology clinic for routine followup in one month. I advised him to call or return sooner if he has any questions or concerns related to his recovery or treatment. ________________________________  Sheral Apley. Tammi Klippel, M.D.  This document serves as a record of services personally performed by Tyler Pita, MD. It was created on his behalf by Wilburn Mylar, a trained medical scribe. The creation of this record is based on the scribe's personal observations and the provider's statements to them. This document has been checked and approved by the attending provider.

## 2018-06-08 ENCOUNTER — Ambulatory Visit: Payer: Self-pay | Admitting: Urology

## 2018-06-08 ENCOUNTER — Telehealth: Payer: Self-pay | Admitting: Urology

## 2018-06-08 NOTE — Telephone Encounter (Addendum)
Radiation Oncology         [phone] ________________________________  Name: Michael [name] MRN: [id number]  Date: 06/08/2018  DOB: 10-15-1950  Post Treatment Note  CC: Rolene Course, PA-C  No ref. provider found  Diagnosis:   68 y.o.gentleman with Stage T2aadenocarcinoma of the prostate with Gleason score of 3+4, and PSA of7.54     Interval Since Last Radiation:  3 weeks   04/11/2018 - 05/18/2018:  The prostate was treated to 70 Gy in 28 fractions of 2.5 Gy, concurrent with ADT  Narrative:  I spoke with the patient to conduct his routine scheduled 1 month follow up visit via telephone to spare the patient unnecessary potential exposure in the healthcare setting during the current COVID-19 pandemic.  The patient was notified in advance and gave permission to proceed with this visit format. He tolerated radiation treatment relatively well with nocturia x2 and mild fatigue as well as difficulty emptying his bladder, dysuria, leakage, urgency, constipation, and urinary hesitancy. He denied hematuria throughout treatment and continued to tolerate his ADT well throughout his radiation treatments.                           On review of systems, the patient states that he is doing very well overall.  He continues with nocturia 2-3 times per night with occasional intermittent stream but specifically denies dysuria, gross hematuria, straining to void or feelings of incomplete bladder emptying.  He continues with modest fatigue but reports that this is gradually improving.  He reports a healthy appetite and is maintaining his weight.  He denies abdominal pain, nausea, vomiting, diarrhea or constipation.  Overall, he is quite pleased with his progress to date.  ALLERGIES:  is allergic to demerol; lactose intolerance (gi); penicillins; actos [pioglitazone]; exenatide; glimepiride; nabumetone; other; and oxycodone.  Meds: Current Outpatient Medications  Medication Sig Dispense Refill  .  ACCU-CHEK AVIVA PLUS test strip AS DIRECTED SEVEN TIMES A DAY IN VITRO 90 DAYS  4  . amLODipine (NORVASC) 5 MG tablet Take 1 tablet (5 mg total) by mouth daily. 90 tablet 3  . atorvastatin (LIPITOR) 40 MG tablet Take 40 mg by mouth at bedtime.  5  . benazepril (LOTENSIN) 40 MG tablet Take 40 mg by mouth daily.      . clonazePAM (KLONOPIN) 0.5 MG tablet TAKE HALF TABLET DAILY AS NEEDED FOR SLEEP  5  . clopidogrel (PLAVIX) 75 MG tablet TAKE 1 TABLET (75 MG TOTAL) BY MOUTH DAILY WITH BREAKFAST. 90 tablet 3  . fexofenadine (ALLEGRA) 180 MG tablet Take 180 mg by mouth daily.    . fluticasone (FLONASE) 50 MCG/ACT nasal spray Place 2 sprays into both nostrils at bedtime as needed (before uses CPAP if needed).     . furosemide (LASIX) 20 MG tablet Take 1 tablet (20 mg total) by mouth every other day. 15 tablet 11  . Homeopathic Products (PSORIASIS/ECZEMA RELIEF EX) Apply 1 application topically daily.    Marland Kitchen ibuprofen (ADVIL,MOTRIN) 200 MG tablet Take 800 mg by mouth daily as needed for mild pain.     Marland Kitchen insulin glargine (LANTUS) 100 UNIT/ML injection Inject 24 Units into the skin at bedtime.     . metoprolol succinate (TOPROL-XL) 100 MG 24 hr tablet Take 100 mg by mouth daily.     Marland Kitchen NOVOLOG FLEXPEN 100 UNIT/ML FlexPen USE 18 UNITS IF BLOOD SUGAR 86 TO 126, THEN 1 UNIT FOR EVERY 20MG /DL ABOVE GOAL. 3 TIMES  DAILY WITH MEALS  2  . potassium chloride (K-DUR) 10 MEQ tablet Take 2 tablets (20 mEq total) by mouth every other day. With Lasix 90 tablet 3  . tamsulosin (FLOMAX) 0.4 MG CAPS capsule TAKE 1 CAPSULE BY MOUTH EVERY DAY AFTER DINNER  5   No current facility-administered medications for this visit.     Physical Findings: Unable to assess due to telephone format visit.  Lab Findings: Lab Results  Component Value Date   HGB 14.6 03/16/2017   HCT 43.0 03/16/2017   Radiographic Findings: No results found.  Impression/Plan: 1. 68 y.o.gentleman with Stage T2aadenocarcinoma of the prostate with  Gleason score of 3+4, and PSA of7.54.   He will continue to follow up with urology for ongoing PSA determinations with Dr. Estill Dooms going forward.  He does not currently have a scheduled follow-up visit but anticipates a repeat PSA with Dr. Estill Dooms in approximately 2 to 3 months time.  He understands what to expect with regards to PSA monitoring going forward. I will look forward to following his response to treatment via correspondence with urology, and would be happy to continue to participate in his care if clinically indicated. I talked to the patient about what to expect in the future, including his risk for erectile dysfunction and rectal bleeding. I encouraged him to call or return to the office if he has any questions regarding his previous radiation or possible radiation side effects. He was comfortable with this plan and will follow up as needed.    Nicholos Johns, PA-C

## 2018-06-22 ENCOUNTER — Ambulatory Visit: Payer: Self-pay | Admitting: Urology

## 2018-07-27 DIAGNOSIS — M79642 Pain in left hand: Secondary | ICD-10-CM | POA: Diagnosis not present

## 2018-07-27 DIAGNOSIS — C61 Malignant neoplasm of prostate: Secondary | ICD-10-CM | POA: Diagnosis not present

## 2018-07-27 DIAGNOSIS — M79641 Pain in right hand: Secondary | ICD-10-CM | POA: Diagnosis not present

## 2018-07-27 DIAGNOSIS — I10 Essential (primary) hypertension: Secondary | ICD-10-CM | POA: Diagnosis not present

## 2018-08-04 DIAGNOSIS — C61 Malignant neoplasm of prostate: Secondary | ICD-10-CM | POA: Diagnosis not present

## 2018-08-04 DIAGNOSIS — R972 Elevated prostate specific antigen [PSA]: Secondary | ICD-10-CM | POA: Diagnosis not present

## 2018-08-29 ENCOUNTER — Other Ambulatory Visit: Payer: Self-pay

## 2018-08-29 MED ORDER — FUROSEMIDE 20 MG PO TABS: 20.0000 mg | ORAL_TABLET | ORAL | 11 refills | Status: DC

## 2018-08-30 ENCOUNTER — Other Ambulatory Visit: Payer: Self-pay

## 2018-08-30 DIAGNOSIS — I739 Peripheral vascular disease, unspecified: Secondary | ICD-10-CM

## 2018-09-05 ENCOUNTER — Encounter: Payer: Self-pay | Admitting: Surgery

## 2018-09-05 ENCOUNTER — Ambulatory Visit (HOSPITAL_COMMUNITY)
Admission: RE | Admit: 2018-09-05 | Discharge: 2018-09-05 | Disposition: A | Discharge status: Home / Self Care | Payer: Medicare Other | Source: Ambulatory Visit | Attending: Family | Admitting: Family

## 2018-09-05 ENCOUNTER — Ambulatory Visit (INDEPENDENT_AMBULATORY_CARE_PROVIDER_SITE_OTHER): Payer: Medicare Other | Admitting: Surgery

## 2018-09-05 ENCOUNTER — Other Ambulatory Visit: Payer: Self-pay

## 2018-09-05 ENCOUNTER — Ambulatory Visit (INDEPENDENT_AMBULATORY_CARE_PROVIDER_SITE_OTHER)
Admission: RE | Admit: 2018-09-05 | Discharge: 2018-09-05 | Disposition: A | Discharge status: Home / Self Care | Payer: Medicare Other | Source: Ambulatory Visit | Attending: Family | Admitting: Family

## 2018-09-05 VITALS — BP 118/75 | HR 90 | Temp 97.8°F | Resp 20 | Ht 71.0 in | Wt 229.7 lb

## 2018-09-05 DIAGNOSIS — I739 Peripheral vascular disease, unspecified: Secondary | ICD-10-CM

## 2018-09-05 DIAGNOSIS — I70211 Atherosclerosis of native arteries of extremities with intermittent claudication, right leg: Secondary | ICD-10-CM | POA: Diagnosis not present

## 2018-09-05 NOTE — Progress Notes (Signed)
[facility]  Patient name: Michael [name] MRN: [id number] DOB: Oct 29, 1950 Sex: male   REASON FOR VISIT:    Follow up  HISOTRY OF PRESENT ILLNESS:    The patient is back today for followup. He Is a 68 year old male patient who was initially seen by Dr. Kellie Simmering for right leg claudication. I subsequently placed a right superficial femoral artery stent in September 2012. On 09/01/2016 he underwent atherectomy with drug-coated balloon angioplasty for in-stent stenosis within the right superficial femoral artery.  On March 16, 2017 he underwent stenting of his right superficial femoral artery for in-stent stenosis, after suboptimal results with angioplasty.  25mm overlapping Viabahn stents were placed.  He is complaining of pain in his right hip particularly when he stands up.  He is also having some left wrist pain.  Since I last saw him he has been treated with radiation for prostate cancer.   PAST MEDICAL HISTORY:   Past Medical History:  Diagnosis Date  . Allergy   . Anxiety   . Diabetes mellitus   . Hyperlipidemia   . Hypertension   . PAD (peripheral artery disease) ([place])    a. s/p prior LE stenting 2012, 03/2017 - followed by [facility].  . Prostate cancer [facility])      FAMILY HISTORY:   Family History  Problem Relation Age of Onset  . Hyperlipidemia Mother   . Hypertension Mother   . Heart disease Father   . Deep vein thrombosis Father   . Hyperlipidemia Father   . Hypertension Father   . Heart attack Father   . Peripheral vascular disease Father   . Cancer Sister     SOCIAL HISTORY:   Social History   Tobacco Use  . Smoking status: Never Smoker  . Smokeless tobacco: Former Network engineer Use Topics  . Alcohol use: Yes    Comment: occasional drink     ALLERGIES:   Allergies  Allergen Reactions  . Demerol Anaphylaxis  . Lactose Intolerance (Gi) Anaphylaxis  . Penicillins Anaphylaxis and  Other (See Comments)    Has patient had a PCN reaction causing immediate rash, facial/tongue/throat swelling, SOB or lightheadedness with hypotension: Yes Has patient had a PCN reaction causing severe rash involving mucus membranes or skin necrosis: No Has patient had a PCN reaction that required hospitalization: No Has patient had a PCN reaction occurring within the last 10 years: No If all of the above answers are "NO", then may proceed with Cephalosporin use.  Has patient had a PCN reaction causing immediate rash, facial/tongue/throat swelling, SOB or lightheadedness with hypotension: Yes Has patient had a PCN reaction causing severe rash involving mucus membranes or skin necrosis: No Has patient had a PCN reaction that required hospitalization: No Has patient had a PCN reaction occurring within the last 10 years: No If all of the above answers are "NO", then may proceed with Cephalosporin use. Has patient had a PCN reaction causing immediate rash, facial/tongue/throat swelling, SOB or lightheadedness with hypotension: Yes Has patient had a PCN reaction causing severe rash involving mucus membranes or skin necrosis: No Has patient had a PCN reaction that required hospitalization: No Has patient had a PCN reaction occurring within the last 10 years: No If all of the above answers are "NO", then may proceed with Cephalosporin use.  . Actos [Pioglitazone] Nausea Only  . Exenatide Nausea Only and Other (See Comments)  . Glimepiride Other (See Comments)    Edema in ankles   .  Nabumetone Nausea Only and Other (See Comments)  . Other   . Oxycodone Nausea Only and Other (See Comments)     CURRENT MEDICATIONS:   Current Outpatient Medications  Medication Sig Dispense Refill  . ACCU-CHEK AVIVA PLUS test strip AS DIRECTED SEVEN TIMES A DAY IN VITRO 90 DAYS  4  . amLODipine (NORVASC) 5 MG tablet Take 1 tablet (5 mg total) by mouth daily. 90 tablet 3  . atorvastatin (LIPITOR) 40 MG tablet  Take 40 mg by mouth at bedtime.  5  . benazepril (LOTENSIN) 40 MG tablet Take 40 mg by mouth daily.      . clonazePAM (KLONOPIN) 0.5 MG tablet TAKE HALF TABLET DAILY AS NEEDED FOR SLEEP  5  . clopidogrel (PLAVIX) 75 MG tablet TAKE 1 TABLET (75 MG TOTAL) BY MOUTH DAILY WITH BREAKFAST. 90 tablet 3  . fexofenadine (ALLEGRA) 180 MG tablet Take 180 mg by mouth daily.    . fluticasone (FLONASE) 50 MCG/ACT nasal spray Place 2 sprays into both nostrils at bedtime as needed (before uses CPAP if needed).     . furosemide (LASIX) 20 MG tablet Take 1 tablet (20 mg total) by mouth every other day. 15 tablet 11  . Homeopathic Products (PSORIASIS/ECZEMA RELIEF EX) Apply 1 application topically daily.    Marland Kitchen HUMALOG KWIKPEN 100 UNIT/ML KwikPen     . ibuprofen (ADVIL,MOTRIN) 200 MG tablet Take 800 mg by mouth daily as needed for mild pain.     Marland Kitchen insulin glargine (LANTUS) 100 UNIT/ML injection Inject 24 Units into the skin at bedtime.     . metoprolol succinate (TOPROL-XL) 100 MG 24 hr tablet Take 100 mg by mouth daily.     . potassium chloride (K-DUR) 10 MEQ tablet Take 2 tablets (20 mEq total) by mouth every other day. With Lasix 90 tablet 3  . tamsulosin (FLOMAX) 0.4 MG CAPS capsule TAKE 1 CAPSULE BY MOUTH EVERY DAY AFTER DINNER  5   No current facility-administered medications for this visit.     REVIEW OF SYSTEMS:   [X]  denotes positive finding, [ ]  denotes negative finding Cardiac  Comments:  Chest pain or chest pressure:    Shortness of breath upon exertion:    Short of breath when lying flat:    Irregular heart rhythm:        Vascular    Pain in calf, thigh, or hip brought on by ambulation: x   Pain in feet at night that wakes you up from your sleep:     Blood clot in your veins:    Leg swelling:         Pulmonary    Oxygen at home:    Productive cough:     Wheezing:         Neurologic    Sudden weakness in arms or legs:     Sudden numbness in arms or legs:     Sudden onset of difficulty  speaking or slurred speech:    Temporary loss of vision in one eye:     Problems with dizziness:         Gastrointestinal    Blood in stool:     Vomited blood:         Genitourinary    Burning when urinating:     Blood in urine:        Psychiatric    Major depression:         Hematologic    Bleeding problems:    Problems  with blood clotting too easily:        Skin    Rashes or ulcers:        Constitutional    Fever or chills:      PHYSICAL EXAM:   Vitals:   09/05/18 1059  BP: 118/75  Pulse: 90  Resp: 20  Temp: 97.8 F (36.6 C)  SpO2: 96%  Weight: 229 lb 11.2 oz (104.2 kg)  Height: 5\' 11"  (1.803 m)    GENERAL: The patient is a well-nourished male, in no acute distress. The vital signs are documented above. CARDIAC: There is a regular rate and rhythm.  VASCULAR: palpable pedal pulses PULMONARY: Non-labored respirations MUSCULOSKELETAL: There are no major deformities or cyanosis. NEUROLOGIC: No focal weakness or paresthesias are detected. SKIN: There are no ulcers or rashes noted. PSYCHIATRIC: The patient has a normal affect.  STUDIES:   I have reviewed the following:  +-------+-----------+-----------+------------+------------+ ABI/TBIToday's ABIToday's TBIPrevious ABIPrevious TBI +-------+-----------+-----------+------------+------------+ Right  1.09       0.77       1.01        0.84         +-------+-----------+-----------+------------+------------+ Left   1.08       0.83       1.26        0.83         +-------+-----------+-----------+------------+------------+ Right toe pressure:  101 Left toe pressure:  110  DUPLEX: Right: Near normal examination. Unable to identify stent walls due to calcified plaque throughout the right lower extremity. However, the arteries appear patent in their entirety with triphasic waveforms noted.  MEDICAL ISSUES:   He is doing very well from a  Vascular perspective.  I do not believe any of his issues  currently are related to his arterial disease.  He will follow up in 1 year with ABI's and duplex   Annamarie Major, IV, MD, FACS [facility] [phone] Pager [phone]

## 2018-09-19 ENCOUNTER — Encounter (HOSPITAL_COMMUNITY): Payer: Self-pay

## 2018-09-19 ENCOUNTER — Ambulatory Visit: Payer: Self-pay | Admitting: Family

## 2018-09-26 ENCOUNTER — Telehealth: Payer: Self-pay | Admitting: Cardiology

## 2018-09-26 DIAGNOSIS — Z794 Long term (current) use of insulin: Secondary | ICD-10-CM | POA: Diagnosis not present

## 2018-09-26 DIAGNOSIS — F419 Anxiety disorder, unspecified: Secondary | ICD-10-CM | POA: Diagnosis not present

## 2018-09-26 DIAGNOSIS — Z7984 Long term (current) use of oral hypoglycemic drugs: Secondary | ICD-10-CM | POA: Diagnosis not present

## 2018-09-26 DIAGNOSIS — Z1159 Encounter for screening for other viral diseases: Secondary | ICD-10-CM | POA: Diagnosis not present

## 2018-09-26 DIAGNOSIS — E782 Mixed hyperlipidemia: Secondary | ICD-10-CM | POA: Diagnosis not present

## 2018-09-26 DIAGNOSIS — M79642 Pain in left hand: Secondary | ICD-10-CM | POA: Diagnosis not present

## 2018-09-26 DIAGNOSIS — G479 Sleep disorder, unspecified: Secondary | ICD-10-CM | POA: Diagnosis not present

## 2018-09-26 DIAGNOSIS — I10 Essential (primary) hypertension: Secondary | ICD-10-CM | POA: Diagnosis not present

## 2018-09-26 DIAGNOSIS — E1122 Type 2 diabetes mellitus with diabetic chronic kidney disease: Secondary | ICD-10-CM | POA: Diagnosis not present

## 2018-09-26 DIAGNOSIS — I739 Peripheral vascular disease, unspecified: Secondary | ICD-10-CM | POA: Diagnosis not present

## 2018-09-26 DIAGNOSIS — C61 Malignant neoplasm of prostate: Secondary | ICD-10-CM | POA: Diagnosis not present

## 2018-09-26 NOTE — Telephone Encounter (Signed)
New Message ° ° ° °Left message to confirm appt and answer COVID questions  °

## 2018-09-27 ENCOUNTER — Other Ambulatory Visit: Payer: Self-pay

## 2018-09-27 ENCOUNTER — Ambulatory Visit (INDEPENDENT_AMBULATORY_CARE_PROVIDER_SITE_OTHER): Payer: Medicare Other | Admitting: Cardiology

## 2018-09-27 ENCOUNTER — Encounter: Payer: Self-pay | Admitting: Cardiology

## 2018-09-27 VITALS — BP 128/76 | HR 90 | Ht 71.0 in | Wt 232.4 lb

## 2018-09-27 DIAGNOSIS — I10 Essential (primary) hypertension: Secondary | ICD-10-CM | POA: Diagnosis not present

## 2018-09-27 DIAGNOSIS — E782 Mixed hyperlipidemia: Secondary | ICD-10-CM | POA: Diagnosis not present

## 2018-09-27 DIAGNOSIS — I70211 Atherosclerosis of native arteries of extremities with intermittent claudication, right leg: Secondary | ICD-10-CM | POA: Diagnosis not present

## 2018-09-27 NOTE — Patient Instructions (Signed)
Medication Instructions:  none If you need a refill on your cardiac medications before your next appointment, please call your pharmacy.   Lab work: none If you have labs (blood work) drawn today and your tests are completely normal, you will receive your results only by: . MyChart Message (if you have MyChart) OR . A paper copy in the mail If you have any lab test that is abnormal or we need to change your treatment, we will call you to review the results.  Testing/Procedures: none  Follow-Up: At [facility], you and your health needs are our priority.  As part of our continuing mission to provide you with exceptional heart care, we have created designated Provider Care Teams.  These Care Teams include your primary Cardiologist (physician) and Advanced Practice Providers (APPs -  Physician Assistants and Nurse Practitioners) who all work together to provide you with the care you need, when you need it. You will need a follow up appointment in 1 years.  Please call our office 2 months in advance to schedule this appointment.  You may see Jayadeep Varanasi, MD or one of the following Advanced Practice Providers on your designated Care Team:   Brittainy Simmons, PA-C Dayna Dunn, PA-C . Michele Lenze, PA-C  Any Other Special Instructions Will Be Listed Below (If Applicable).    

## 2018-09-27 NOTE — Progress Notes (Signed)
09/27/2018 Michael [name]   [birth year]  [id number]  Primary Physician Rolene Course, PA-C Primary Cardiologist: Larae Grooms, MD  Electrophysiologist: None   Reason for Visit/CC: Annual cardiac examination  HPI:  Michael [name] is a 68 y.o. male with history of lower extremity PAD s/p stenting (followed by [facility]), anxiety, DM, HTN, HLD, obesity and low testosterone who presents for his annual cardiac exam. He has no formal history of ischemic heart disease. 2D echo 2013 showed EF 60-65%, mild LVH. Stress test in 2014 was normal, EF 70%. He does have PAD and is followed by Dr. Trula Slade. He has no h/o smoking. He has prior hx of LE stenting in 2012 and angioplasty/stenting in 03/2017. His primary cardiologist is Dr. Irish Lack.   He reports that he has done well from a cardiac standpoint over the last year.  He denies any chest pain or dyspnea.  No exertional symptoms.  Also denies palpitations, lower extremity edema, orthopnea, PND, dizziness, syncope/near syncope.  Blood pressures well controlled.  He is on Lipitor for cholesterol.  His PCP follows his lipid profile.  He reports that he just had a follow-up visit at [facility] family practice in [place] and had laboratory work there yesterday.  No tobacco use.  His only main issue over the last year was a new diagnosis of prostate cancer.  He underwent 6 weeks of radiation treatment in February 2020.     Current Meds  Medication Sig  . ACCU-CHEK AVIVA PLUS test strip AS DIRECTED SEVEN TIMES A DAY IN VITRO 90 DAYS  . amLODipine (NORVASC) 5 MG tablet Take 1 tablet (5 mg total) by mouth daily.  Marland Kitchen atorvastatin (LIPITOR) 40 MG tablet Take 40 mg by mouth at bedtime.  . benazepril (LOTENSIN) 40 MG tablet Take 40 mg by mouth daily.    . clonazePAM (KLONOPIN) 0.5 MG tablet TAKE HALF TABLET DAILY AS NEEDED FOR SLEEP  . clopidogrel (PLAVIX) 75 MG tablet TAKE 1 TABLET (75 MG TOTAL) BY MOUTH DAILY WITH BREAKFAST.  . fexofenadine (ALLEGRA) 180 MG  tablet Take 180 mg by mouth daily.  . fluticasone (FLONASE) 50 MCG/ACT nasal spray Place 2 sprays into both nostrils at bedtime as needed (before uses CPAP if needed).   . furosemide (LASIX) 20 MG tablet Take 1 tablet (20 mg total) by mouth every other day.  . Homeopathic Products (PSORIASIS/ECZEMA RELIEF EX) Apply 1 application topically daily.  Marland Kitchen HUMALOG KWIKPEN 100 UNIT/ML KwikPen   . ibuprofen (ADVIL,MOTRIN) 200 MG tablet Take 800 mg by mouth daily as needed for mild pain.   Marland Kitchen insulin glargine (LANTUS) 100 UNIT/ML injection Inject 24 Units into the skin at bedtime.   Marland Kitchen JARDIANCE 25 MG TABS tablet Take 25 mg by mouth daily.  . metoprolol succinate (TOPROL-XL) 100 MG 24 hr tablet Take 100 mg by mouth daily.   . solifenacin (VESICARE) 5 MG tablet Take 5 mg by mouth daily.  . tamsulosin (FLOMAX) 0.4 MG CAPS capsule TAKE 1 CAPSULE BY MOUTH EVERY DAY AFTER DINNER  . TRULICITY 8.11 BJ/4.7WG SOPN Inject 0.75 mg into the skin once a week.   Allergies  Allergen Reactions  . Demerol Anaphylaxis  . Lactose Intolerance (Gi) Anaphylaxis  . Penicillins Anaphylaxis and Other (See Comments)    Has patient had a PCN reaction causing immediate rash, facial/tongue/throat swelling, SOB or lightheadedness with hypotension: Yes Has patient had a PCN reaction causing severe rash involving mucus membranes or skin necrosis: No Has patient had a PCN reaction that  required hospitalization: No Has patient had a PCN reaction occurring within the last 10 years: No If all of the above answers are "NO", then may proceed with Cephalosporin use.  Has patient had a PCN reaction causing immediate rash, facial/tongue/throat swelling, SOB or lightheadedness with hypotension: Yes Has patient had a PCN reaction causing severe rash involving mucus membranes or skin necrosis: No Has patient had a PCN reaction that required hospitalization: No Has patient had a PCN reaction occurring within the last 10 years: No If all of the  above answers are "NO", then may proceed with Cephalosporin use. Has patient had a PCN reaction causing immediate rash, facial/tongue/throat swelling, SOB or lightheadedness with hypotension: Yes Has patient had a PCN reaction causing severe rash involving mucus membranes or skin necrosis: No Has patient had a PCN reaction that required hospitalization: No Has patient had a PCN reaction occurring within the last 10 years: No If all of the above answers are "NO", then may proceed with Cephalosporin use.  . Actos [Pioglitazone] Nausea Only  . Exenatide Nausea Only and Other (See Comments)  . Glimepiride Other (See Comments)    Edema in ankles   . Nabumetone Nausea Only and Other (See Comments)  . Other   . Oxycodone Nausea Only and Other (See Comments)   Past Medical History:  Diagnosis Date  . Allergy   . Anxiety   . Diabetes mellitus   . Hyperlipidemia   . Hypertension   . PAD (peripheral artery disease) ([place])    a. s/p prior LE stenting 2012, 03/2017 - followed by [facility].  . Prostate cancer [facility])    Family History  Problem Relation Age of Onset  . Hyperlipidemia Mother   . Hypertension Mother   . Heart disease Father   . Deep vein thrombosis Father   . Hyperlipidemia Father   . Hypertension Father   . Heart attack Father   . Peripheral vascular disease Father   . Cancer Sister    Past Surgical History:  Procedure Laterality Date  . ABDOMINAL AORTOGRAM N/A 09/01/2016   Procedure: Abdominal Aortogram;  Surgeon: Serafina Mitchell, MD;  Location: [place] [facility];  Service: Cardiovascular;  Laterality: N/A;  . ABDOMINAL AORTOGRAM W/LOWER EXTREMITY N/A 03/16/2017   Procedure: ABDOMINAL AORTOGRAM W/LOWER EXTREMITY;  Surgeon: Serafina Mitchell, MD;  Location: [place] [facility];  Service: Cardiovascular;  Laterality: N/A;  . ANGIOPLASTY / STENTING FEMORAL  11/05/10   Left SFA stent  . KNEE ARTHROSCOPY    . LOWER EXTREMITY ANGIOGRAPHY N/A 09/01/2016   Procedure: Lower Extremity  Angiography;  Surgeon: Serafina Mitchell, MD;  Location: [place] [facility];  Service: Cardiovascular;  Laterality: N/A;  . PERIPHERAL VASCULAR ATHERECTOMY Right 09/01/2016   Procedure: Peripheral Vascular Atherectomy;  Surgeon: Serafina Mitchell, MD;  Location: [place] [facility];  Service: Cardiovascular;  Laterality: Right;  superficial femoral  . PERIPHERAL VASCULAR INTERVENTION Right 03/16/2017   Procedure: PERIPHERAL VASCULAR INTERVENTION;  Surgeon: Serafina Mitchell, MD;  Location: [place] [facility];  Service: Cardiovascular;  Laterality: Right;  superficicial femoral  . ROTATOR CUFF REPAIR     1990   Social History   Socioeconomic History  . Marital status: Married    Spouse name: Not on file  . Number of children: Not on file  . Years of education: Not on file  . Highest education level: Not on file  Occupational History  . Not on file  Social Needs  . Financial resource strain:  Not on file  . Food insecurity    Worry: Not on file    Inability: Not on file  . Transportation needs    Medical: Not on file    Non-medical: Not on file  Tobacco Use  . Smoking status: Never Smoker  . Smokeless tobacco: Former Network engineer and Sexual Activity  . Alcohol use: Yes    Comment: occasional drink  . Drug use: No  . Sexual activity: Not on file  Lifestyle  . Physical activity    Days per week: Not on file    Minutes per session: Not on file  . Stress: Not on file  Relationships  . Social Herbalist on phone: Not on file    Gets together: Not on file    Attends religious service: Not on file    Active member of club or organization: Not on file    Attends meetings of clubs or organizations: Not on file    Relationship status: Not on file  . Intimate partner violence    Fear of current or ex partner: Not on file    Emotionally abused: Not on file    Physically abused: Not on file    Forced sexual activity: Not on file  Other Topics Concern  . Not on file  Social  History Narrative  . Not on file     Lipid Panel  No results found for: CHOL, TRIG, HDL, CHOLHDL, VLDL, LDLCALC, LDLDIRECT  Review of Systems: General: negative for chills, fever, night sweats or weight changes.  Cardiovascular: negative for chest pain, dyspnea on exertion, edema, orthopnea, palpitations, paroxysmal nocturnal dyspnea or shortness of breath Dermatological: negative for rash Respiratory: negative for cough or wheezing Urologic: negative for hematuria Abdominal: negative for nausea, vomiting, diarrhea, bright red blood per rectum, melena, or hematemesis Neurologic: negative for visual changes, syncope, or dizziness All other systems reviewed and are otherwise negative except as noted above.   Physical Exam:  Blood pressure 128/76, pulse 90, height 5\' 11"  (1.803 m), weight 232 lb 6.4 oz (105.4 kg), SpO2 98 %.  General appearance: alert, cooperative and no distress Neck: no carotid bruit and no JVD Lungs: clear to auscultation bilaterally Heart: regular rate and rhythm, S1, S2 normal, no murmur, click, rub or gallop Extremities: extremities normal, atraumatic, no cyanosis or edema Pulses: 2+ and symmetric Skin: Skin color, texture, turgor normal. No rashes or lesions Neurologic: Grossly normal  EKG NSR 90 bpm -- personally reviewed   ASSESSMENT AND PLAN:   1. PVD: s/p SFA stent in Sep 2012. On 09/01/2016 he underwent atherectomy with drug-coated balloon angioplasty for in-stent stenosis within the right superficial femoral artery. On March 16, 2017 he underwent stenting of his right superficial femoral artery for in-stent stenosis, after suboptimal results with angioplasty. He is followed by [facility]. He had recent f/u with Dr. Trula Slade on 09/05/18 and was doing well w/ claudication. F/u duplex ok. Plan to repeat again in 1 yr.   2. HTN: Controlled on current regimen  3. DM: followed by Dr. Buddy Duty.  Last hemoglobin A1c in February was elevated at 8.8  4. Obesity: Patient  admits to weight gain over the last several months due to [place].  He has been more sedentary and has been eating more.  He will try to lose weight.  5.  Prostate cancer: New diagnosis made earlier this year.  He has completed 6 weeks of radiation.  Followed by urology.  6.  Cardiac  examination: Patient denies any worrisome cardiac symptoms.  No angina.  EKG normal sinus rhythm.  Physical  exam benign.  Continue control of cardiac risk factors as outlined above.  Follow-Up in 1 yr.   Brittainy Ladoris Gene, MHS [facility] 09/27/2018 9:10 AM

## 2018-09-29 ENCOUNTER — Telehealth: Payer: Self-pay

## 2018-09-29 DIAGNOSIS — E781 Pure hyperglyceridemia: Secondary | ICD-10-CM

## 2018-09-29 NOTE — Telephone Encounter (Signed)
-----   Message from Consuelo Pandy, [place] sent at 09/29/2018  3:43 PM EDT ----- LDL looks great at 53 (should be < 70 given his PVD). His TG however are elevated at 200 (should be < 150). I recommend that he try to eat a low fat diet and try to increase physical activity. He should get a f/u FLP in 3 months to reassess. If TGs are still elevated, at that time would recommend medication

## 2018-09-29 NOTE — Telephone Encounter (Signed)
Notes recorded by Frederik Schmidt, RN on 09/29/2018 at 3:59 PM EDT  The patient has been notified of the result and verbalized understanding. All questions (if any) were answered.  Frederik Schmidt, RN 09/29/2018 3:59 PM

## 2018-09-29 NOTE — Telephone Encounter (Signed)
Notes recorded by Frederik Schmidt, RN on 09/29/2018 at 3:46 PM EDT  [id number] 7/30  ------

## 2018-10-14 DIAGNOSIS — M24139 Other articular cartilage disorders, unspecified wrist: Secondary | ICD-10-CM | POA: Insufficient documentation

## 2018-10-14 DIAGNOSIS — M24132 Other articular cartilage disorders, left wrist: Secondary | ICD-10-CM | POA: Diagnosis not present

## 2018-10-14 DIAGNOSIS — M25532 Pain in left wrist: Secondary | ICD-10-CM | POA: Diagnosis not present

## 2018-10-24 DIAGNOSIS — C61 Malignant neoplasm of prostate: Secondary | ICD-10-CM | POA: Diagnosis not present

## 2018-10-24 DIAGNOSIS — Z5181 Encounter for therapeutic drug level monitoring: Secondary | ICD-10-CM | POA: Diagnosis not present

## 2018-10-24 DIAGNOSIS — E230 Hypopituitarism: Secondary | ICD-10-CM | POA: Diagnosis not present

## 2018-10-24 DIAGNOSIS — Z794 Long term (current) use of insulin: Secondary | ICD-10-CM | POA: Diagnosis not present

## 2018-10-24 DIAGNOSIS — E1151 Type 2 diabetes mellitus with diabetic peripheral angiopathy without gangrene: Secondary | ICD-10-CM | POA: Diagnosis not present

## 2018-10-27 ENCOUNTER — Other Ambulatory Visit: Payer: Self-pay | Admitting: Physician Assistant

## 2018-11-11 DIAGNOSIS — M24139 Other articular cartilage disorders, unspecified wrist: Secondary | ICD-10-CM | POA: Diagnosis not present

## 2018-11-11 DIAGNOSIS — M25532 Pain in left wrist: Secondary | ICD-10-CM | POA: Diagnosis not present

## 2018-11-25 DIAGNOSIS — Z01812 Encounter for preprocedural laboratory examination: Secondary | ICD-10-CM | POA: Diagnosis not present

## 2018-11-28 DIAGNOSIS — Z23 Encounter for immunization: Secondary | ICD-10-CM | POA: Diagnosis not present

## 2018-12-06 DIAGNOSIS — R300 Dysuria: Secondary | ICD-10-CM | POA: Diagnosis not present

## 2018-12-12 DIAGNOSIS — G5603 Carpal tunnel syndrome, bilateral upper limbs: Secondary | ICD-10-CM | POA: Diagnosis not present

## 2018-12-12 DIAGNOSIS — G5623 Lesion of ulnar nerve, bilateral upper limbs: Secondary | ICD-10-CM | POA: Diagnosis not present

## 2018-12-16 DIAGNOSIS — G5601 Carpal tunnel syndrome, right upper limb: Secondary | ICD-10-CM | POA: Diagnosis not present

## 2018-12-21 DIAGNOSIS — Z86010 Personal history of colonic polyps: Secondary | ICD-10-CM | POA: Diagnosis not present

## 2018-12-21 DIAGNOSIS — R1032 Left lower quadrant pain: Secondary | ICD-10-CM | POA: Diagnosis not present

## 2018-12-21 DIAGNOSIS — K5900 Constipation, unspecified: Secondary | ICD-10-CM | POA: Diagnosis not present

## 2018-12-21 DIAGNOSIS — Z8546 Personal history of malignant neoplasm of prostate: Secondary | ICD-10-CM | POA: Diagnosis not present

## 2018-12-21 DIAGNOSIS — Z8679 Personal history of other diseases of the circulatory system: Secondary | ICD-10-CM | POA: Diagnosis not present

## 2018-12-26 ENCOUNTER — Other Ambulatory Visit: Payer: Self-pay | Admitting: Gastroenterology

## 2018-12-26 DIAGNOSIS — R1032 Left lower quadrant pain: Secondary | ICD-10-CM

## 2018-12-30 ENCOUNTER — Other Ambulatory Visit: Payer: Medicare Other | Admitting: *Deleted

## 2018-12-30 ENCOUNTER — Other Ambulatory Visit: Payer: Self-pay

## 2018-12-30 ENCOUNTER — Ambulatory Visit
Admission: RE | Admit: 2018-12-30 | Discharge: 2018-12-30 | Disposition: A | Discharge status: Home / Self Care | Payer: Medicare Other | Source: Ambulatory Visit | Attending: Gastroenterology | Admitting: Gastroenterology

## 2018-12-30 DIAGNOSIS — I700 Atherosclerosis of aorta: Secondary | ICD-10-CM | POA: Diagnosis not present

## 2018-12-30 DIAGNOSIS — N200 Calculus of kidney: Secondary | ICD-10-CM | POA: Diagnosis not present

## 2018-12-30 DIAGNOSIS — N400 Enlarged prostate without lower urinary tract symptoms: Secondary | ICD-10-CM | POA: Diagnosis not present

## 2018-12-30 DIAGNOSIS — E781 Pure hyperglyceridemia: Secondary | ICD-10-CM | POA: Diagnosis not present

## 2018-12-30 DIAGNOSIS — K5730 Diverticulosis of large intestine without perforation or abscess without bleeding: Secondary | ICD-10-CM | POA: Diagnosis not present

## 2018-12-30 DIAGNOSIS — R1032 Left lower quadrant pain: Secondary | ICD-10-CM

## 2018-12-30 LAB — LIPID PANEL
Chol/HDL Ratio: 3.4 ratio (ref 0.0–5.0)
Cholesterol, Total: 154 mg/dL (ref 100–199)
HDL: 45 mg/dL (ref >39)
LDL Chol Calc (NIH): 62 mg/dL (ref 0–99)
Triglycerides: 298 mg/dL — ABNORMAL HIGH (ref 0–149)
VLDL Cholesterol Cal: 47 mg/dL — ABNORMAL HIGH (ref 5–40)

## 2018-12-30 MED ORDER — IOPAMIDOL (ISOVUE-300) INJECTION 61%
125.0000 mL | Freq: Once | INTRAVENOUS | Status: AC | PRN
  Administered 2018-12-30: 125 mL via INTRAVENOUS

## 2019-01-03 DIAGNOSIS — C61 Malignant neoplasm of prostate: Secondary | ICD-10-CM | POA: Diagnosis not present

## 2019-01-09 DIAGNOSIS — C61 Malignant neoplasm of prostate: Secondary | ICD-10-CM | POA: Diagnosis not present

## 2019-02-06 DIAGNOSIS — E230 Hypopituitarism: Secondary | ICD-10-CM | POA: Diagnosis not present

## 2019-02-06 DIAGNOSIS — Z794 Long term (current) use of insulin: Secondary | ICD-10-CM | POA: Diagnosis not present

## 2019-02-06 DIAGNOSIS — E1151 Type 2 diabetes mellitus with diabetic peripheral angiopathy without gangrene: Secondary | ICD-10-CM | POA: Diagnosis not present

## 2019-02-06 DIAGNOSIS — Z5181 Encounter for therapeutic drug level monitoring: Secondary | ICD-10-CM | POA: Diagnosis not present

## 2019-02-08 ENCOUNTER — Telehealth: Payer: Self-pay | Admitting: *Deleted

## 2019-02-08 DIAGNOSIS — I739 Peripheral vascular disease, unspecified: Secondary | ICD-10-CM

## 2019-02-08 DIAGNOSIS — I70211 Atherosclerosis of native arteries of extremities with intermittent claudication, right leg: Secondary | ICD-10-CM

## 2019-02-08 NOTE — Telephone Encounter (Signed)
Patient called and c/o increasing cramping leg pain with walking. He is very concerned about this due to his vascular disease. Instructed him to expect a call from [facility] scheduling for non-invasive vascular studies and appt with PA or NP. He is to report to [facility] for any acute or worsening condition.

## 2019-02-10 DIAGNOSIS — Z1159 Encounter for screening for other viral diseases: Secondary | ICD-10-CM | POA: Diagnosis not present

## 2019-02-15 ENCOUNTER — Telehealth (HOSPITAL_COMMUNITY): Payer: Self-pay

## 2019-02-15 NOTE — Telephone Encounter (Signed)

## 2019-02-16 ENCOUNTER — Ambulatory Visit (HOSPITAL_COMMUNITY)
Admission: RE | Admit: 2019-02-16 | Discharge: 2019-02-16 | Disposition: A | Discharge status: Home / Self Care | Payer: Medicare Other | Source: Ambulatory Visit | Attending: Family | Admitting: Family

## 2019-02-16 ENCOUNTER — Encounter: Payer: Self-pay | Admitting: Family

## 2019-02-16 ENCOUNTER — Other Ambulatory Visit: Payer: Self-pay

## 2019-02-16 ENCOUNTER — Ambulatory Visit (INDEPENDENT_AMBULATORY_CARE_PROVIDER_SITE_OTHER)
Admission: RE | Admit: 2019-02-16 | Discharge: 2019-02-16 | Disposition: A | Discharge status: Home / Self Care | Payer: Medicare Other | Source: Ambulatory Visit | Attending: Family | Admitting: Family

## 2019-02-16 ENCOUNTER — Ambulatory Visit (INDEPENDENT_AMBULATORY_CARE_PROVIDER_SITE_OTHER): Payer: Medicare Other | Admitting: Family

## 2019-02-16 VITALS — BP 126/82 | HR 89 | Temp 97.4°F | Resp 20 | Ht 71.0 in | Wt 224.0 lb

## 2019-02-16 DIAGNOSIS — I779 Disorder of arteries and arterioles, unspecified: Secondary | ICD-10-CM | POA: Diagnosis not present

## 2019-02-16 DIAGNOSIS — Z87891 Personal history of nicotine dependence: Secondary | ICD-10-CM

## 2019-02-16 DIAGNOSIS — I70211 Atherosclerosis of native arteries of extremities with intermittent claudication, right leg: Secondary | ICD-10-CM | POA: Diagnosis not present

## 2019-02-16 DIAGNOSIS — I739 Peripheral vascular disease, unspecified: Secondary | ICD-10-CM | POA: Insufficient documentation

## 2019-02-16 DIAGNOSIS — Z9862 Peripheral vascular angioplasty status: Secondary | ICD-10-CM | POA: Diagnosis not present

## 2019-02-16 DIAGNOSIS — Z959 Presence of cardiac and vascular implant and graft, unspecified: Secondary | ICD-10-CM

## 2019-02-16 NOTE — Progress Notes (Signed)
[facility] [place]   CC: Follow up peripheral artery occlusive disease  History of Present Illness Michael [name] is a 68 y.o. male who is s/p stenting of his right superficial femoral artery for in-stent stenosis, after suboptimal results with angioplasty, 32mm overlappingViabahnstents were placed Oo March 16, 2017 by Dr. Trula Slade.  He is also s/p atherectomy with drug coated balloon angioplastyofright superficial femoral artery on 09-01-16 by Dr. Trula Slade for instent stenosis.The patient has a history of right superficial femoral artery stenting for claudication.   Dr. Trula Slade last evaluated pt on 09-05-18. At that time right leg arterial duplex showed near normal examination. Unable to identify stent walls due to calcified plaque throughout the right lower extremity. However, the arteries appeared patent in their entirety with triphasic waveforms noted. He was doing very well from a  Vascular perspective. He was to follow up in 1 year with ABI's and duplex.  He walks a mile daily with his dog.   He has been treated with radiation for prostate cancer by Dr. Estill Dooms at [facility]. He states that it took him about a year to overcome the fatigue from the tx. He states that his PSA was 7.5, and most recently is 0.   He denies any known history of stroke or TIA.   Pt states he has known L2-3 HNP, symptoms were left low back and buttock pain, had chiropractic treatment for this and physical therapy, ESI's were offered but pt declined.  Pt states a non symptomatic kidney stone was found incidentally on a CT.   He fractured his right wrist and was not able to work out at [employer], sees ortho for this, therapeutic gloves help.   Diabetic:Yes,Pt states his last A1C was7.3, improved from 9.8, Dr. Buddy Duty is his endocrinologist. Tobacco use:non-smoker  Pt meds include: Statin :Yes ASA:No Other anticoagulants/antiplatelets:Plavix   Past Medical History:  Diagnosis  Date  . Allergy   . Anxiety   . Diabetes mellitus   . Hyperlipidemia   . Hypertension   . PAD (peripheral artery disease) ([place])    a. s/p prior LE stenting 2012, 03/2017 - followed by [facility].  . Prostate cancer [facility])     Social History Social History   Tobacco Use  . Smoking status: Never Smoker  . Smokeless tobacco: Former Network engineer Use Topics  . Alcohol use: Yes    Comment: occasional drink  . Drug use: No    Family History Family History  Problem Relation Age of Onset  . Hyperlipidemia Mother   . Hypertension Mother   . Heart disease Father   . Deep vein thrombosis Father   . Hyperlipidemia Father   . Hypertension Father   . Heart attack Father   . Peripheral vascular disease Father   . Cancer Sister     Past Surgical History:  Procedure Laterality Date  . ABDOMINAL AORTOGRAM N/A 09/01/2016   Procedure: Abdominal Aortogram;  Surgeon: Serafina Mitchell, MD;  Location: [place] [facility];  Service: Cardiovascular;  Laterality: N/A;  . ABDOMINAL AORTOGRAM W/LOWER EXTREMITY N/A 03/16/2017   Procedure: ABDOMINAL AORTOGRAM W/LOWER EXTREMITY;  Surgeon: Serafina Mitchell, MD;  Location: [place] [facility];  Service: Cardiovascular;  Laterality: N/A;  . ANGIOPLASTY / STENTING FEMORAL  11/05/10   Left SFA stent  . KNEE ARTHROSCOPY    . LOWER EXTREMITY ANGIOGRAPHY N/A 09/01/2016   Procedure: Lower Extremity Angiography;  Surgeon: Serafina Mitchell, MD;  Location: [place] [facility];  Service: Cardiovascular;  Laterality: N/A;  . PERIPHERAL VASCULAR ATHERECTOMY Right 09/01/2016   Procedure: Peripheral Vascular Atherectomy;  Surgeon: Serafina Mitchell, MD;  Location: [place] [facility];  Service: Cardiovascular;  Laterality: Right;  superficial femoral  . PERIPHERAL VASCULAR INTERVENTION Right 03/16/2017   Procedure: PERIPHERAL VASCULAR INTERVENTION;  Surgeon: Serafina Mitchell, MD;  Location: [place] [facility];  Service: Cardiovascular;  Laterality: Right;  superficicial femoral  .  ROTATOR CUFF REPAIR     1990    Allergies  Allergen Reactions  . Demerol Anaphylaxis  . Lactose Intolerance (Gi) Anaphylaxis  . Penicillins Anaphylaxis and Other (See Comments)    Has patient had a PCN reaction causing immediate rash, facial/tongue/throat swelling, SOB or lightheadedness with hypotension: Yes Has patient had a PCN reaction causing severe rash involving mucus membranes or skin necrosis: No Has patient had a PCN reaction that required hospitalization: No Has patient had a PCN reaction occurring within the last 10 years: No If all of the above answers are "NO", then may proceed with Cephalosporin use.  Has patient had a PCN reaction causing immediate rash, facial/tongue/throat swelling, SOB or lightheadedness with hypotension: Yes Has patient had a PCN reaction causing severe rash involving mucus membranes or skin necrosis: No Has patient had a PCN reaction that required hospitalization: No Has patient had a PCN reaction occurring within the last 10 years: No If all of the above answers are "NO", then may proceed with Cephalosporin use. Has patient had a PCN reaction causing immediate rash, facial/tongue/throat swelling, SOB or lightheadedness with hypotension: Yes Has patient had a PCN reaction causing severe rash involving mucus membranes or skin necrosis: No Has patient had a PCN reaction that required hospitalization: No Has patient had a PCN reaction occurring within the last 10 years: No If all of the above answers are "NO", then may proceed with Cephalosporin use.  . Actos [Pioglitazone] Nausea Only  . Exenatide Nausea Only and Other (See Comments)  . Glimepiride Other (See Comments)    Edema in ankles   . Nabumetone Nausea Only and Other (See Comments)  . Other   . Oxycodone Nausea Only and Other (See Comments)    Current Outpatient Medications  Medication Sig Dispense Refill  . ACCU-CHEK AVIVA PLUS test strip AS DIRECTED SEVEN TIMES A DAY IN VITRO 90 DAYS   4  . amLODipine (NORVASC) 5 MG tablet Take 1 tablet (5 mg total) by mouth daily. 90 tablet 3  . atorvastatin (LIPITOR) 40 MG tablet Take 40 mg by mouth at bedtime.  5  . benazepril (LOTENSIN) 40 MG tablet Take 40 mg by mouth daily.      . clonazePAM (KLONOPIN) 0.5 MG tablet TAKE HALF TABLET DAILY AS NEEDED FOR SLEEP  5  . clopidogrel (PLAVIX) 75 MG tablet TAKE 1 TABLET (75 MG TOTAL) BY MOUTH DAILY WITH BREAKFAST. 90 tablet 3  . fexofenadine (ALLEGRA) 180 MG tablet Take 180 mg by mouth daily.    . fluticasone (FLONASE) 50 MCG/ACT nasal spray Place 2 sprays into both nostrils at bedtime as needed (before uses CPAP if needed).     . furosemide (LASIX) 20 MG tablet Take 1 tablet (20 mg total) by mouth every other day. 15 tablet 11  . Homeopathic Products (PSORIASIS/ECZEMA RELIEF EX) Apply 1 application topically daily.    Marland Kitchen HUMALOG KWIKPEN 100 UNIT/ML KwikPen     . hydrochlorothiazide (HYDRODIURIL) 25 MG tablet TAKE 1 TABLET BY MOUTH EVERY DAY 90 tablet 3  . ibuprofen (  ADVIL,MOTRIN) 200 MG tablet Take 800 mg by mouth daily as needed for mild pain.     Marland Kitchen insulin glargine (LANTUS) 100 UNIT/ML injection Inject 24 Units into the skin at bedtime.     Marland Kitchen JARDIANCE 25 MG TABS tablet Take 25 mg by mouth daily.    . metoprolol succinate (TOPROL-XL) 100 MG 24 hr tablet Take 100 mg by mouth daily.     . solifenacin (VESICARE) 5 MG tablet Take 5 mg by mouth daily.    . tamsulosin (FLOMAX) 0.4 MG CAPS capsule TAKE 1 CAPSULE BY MOUTH EVERY DAY AFTER DINNER  5  . TRULICITY [id number] [id number] SOPN Inject 0.75 mg into the skin once a week.     No current facility-administered medications for this visit.    ROS: See HPI for pertinent positives and negatives.   Physical Examination  Vitals:   02/16/19 0854  BP: 126/82  Pulse: 89  Resp: 20  Temp: (!) 97.4 F (36.3 C)  SpO2: 98%  Weight: 224 lb (101.6 kg)  Height: 5\' 11"  (1.803 m)   Body mass index is 31.24 kg/m.  General: A&O x 3, WDWN, obese male in  NAD. Gait: normal HENT: No gross abnormalities.  Eyes: Pupils are equal and round  Pulmonary: Respirations are non labored, CTAB, good air movement in all fields Cardiac: regular rhythm, no detected murmur.         Carotid Bruits Right Left   Negative Negative   Radial pulses are 2+ palpable bilaterally   Adominal aortic pulse is not palpable                         VASCULAR EXAM: Extremities without ischemic changes, without Gangrene; without open wounds. Thick long toenails                                                                                                           LE Pulses Right Left       FEMORAL  not palpable  2+ palpable        POPLITEAL  not palpable   2+ palpable       POSTERIOR TIBIAL   2+ palpable   not palpable        DORSALIS PEDIS      ANTERIOR TIBIAL not palpable  2+ palpable    Abdomen: soft, NT, no palpable masses. Skin: no rashes, no cellulitis, no ulcers noted. Musculoskeletal: no muscle wasting or atrophy.  Neurologic: A&O X 3; appropriate affect, Sensation is normal; MOTOR FUNCTION:  moving all extremities equally, motor strength 5/5 throughout. Speech is fluent/normal. CN 2-12 intact. Psychiatric: Thought content is normal, mood appropriate for clinical situation.    DATA  Right LE Arterial Duplex (02-16-19): +-----------+--------+-----+---------------+---------+--------+ RIGHT      PSV cm/sRatioStenosis       Waveform Comments +-----------+--------+-----+---------------+---------+--------+ CFA Prox   156          30-49% stenosistriphasic         +-----------+--------+-----+---------------+---------+--------+ CFA Mid    152  triphasic         +-----------+--------+-----+---------------+---------+--------+ CFA Distal 157                         triphasic         +-----------+--------+-----+---------------+---------+--------+ DFA        82                          triphasic          +-----------+--------+-----+---------------+---------+--------+ SFA Prox   88                          triphasic         +-----------+--------+-----+---------------+---------+--------+ SFA Mid    92                          triphasic         +-----------+--------+-----+---------------+---------+--------+ SFA Distal 87                          triphasic         +-----------+--------+-----+---------------+---------+--------+ POP Prox   51                          triphasic         +-----------+--------+-----+---------------+---------+--------+ POP Mid    62                          triphasic         +-----------+--------+-----+---------------+---------+--------+ POP Distal 59                          triphasic         +-----------+--------+-----+---------------+---------+--------+ ATA Distal 65                          triphasic         +-----------+--------+-----+---------------+---------+--------+ PTA Prox   54                          triphasic         +-----------+--------+-----+---------------+---------+--------+ PTA Mid    63                          triphasic         +-----------+--------+-----+---------------+---------+--------+ PTA Distal 48                          triphasic         +-----------+--------+-----+---------------+---------+--------+ PERO Prox  66                          triphasic         +-----------+--------+-----+---------------+---------+--------+ PERO Mid   66                          triphasic         +-----------+--------+-----+---------------+---------+--------+ PERO Distal52                          triphasic         +-----------+--------+-----+---------------+---------+--------+  Summary: Right: 30-49% stenosis noted in the common femoral artery. Unable to delineate superficial femoral artery stent walls due to calcification. However, the arteries appear patent  in their entirety with triphasic waveforms noted.    ABI (Date: 02/16/2019): +---------+------------------+-----+---------+--------+ Right    Rt Pressure (mmHg)IndexWaveform Comment  +---------+------------------+-----+---------+--------+ Brachial 174                    triphasic         +---------+------------------+-----+---------+--------+ PTA      164               0.94 triphasic         +---------+------------------+-----+---------+--------+ DP       170               0.98 triphasic         +---------+------------------+-----+---------+--------+ Great Toe145               0.83 Normal            +---------+------------------+-----+---------+--------+  +---------+------------------+-----+---------+-------+ Left     Lt Pressure (mmHg)IndexWaveform Comment +---------+------------------+-----+---------+-------+ Brachial 172                    triphasic        +---------+------------------+-----+---------+-------+ PTA      197               1.13 triphasic        +---------+------------------+-----+---------+-------+ DP       177               1.02 triphasic        +---------+------------------+-----+---------+-------+ Great Toe169               0.97 Normal           +---------+------------------+-----+---------+-------+  +-------+-----------+-----------+------------+------------+ ABI/TBIToday's ABIToday's TBIPrevious ABIPrevious TBI +-------+-----------+-----------+------------+------------+ Right  0.98       0.83       1.09        0.77         +-------+-----------+-----------+------------+------------+ Left   1.13       0.97       1.08        0.83         +-------+-----------+-----------+------------+------------+ Bilateral ABIs and TBIs appear essentially unchanged compared to prior study on 09/05/2018.   Summary: Right: Resting right ankle-brachial index is within normal range. No evidence of  significant right lower extremity arterial disease. The right toe-brachial index is normal. RT great toe pressure = 145 mmHg.  Left: Resting left ankle-brachial index is within normal range. No evidence of significant left lower extremity arterial disease. The left toe-brachial index is normal. LT Great toe pressure = 169 mmHg.    ASSESSMENT: Michael [name] is a 68 y.o. male who is s/p stenting of his right superficial femoral artery for in-stent stenosis, after suboptimal results with angioplasty, 47mm overlappingViabahnstents were placed Oo March 16, 2017 by Dr. Trula Slade.  He is also s/p atherectomy with drug coated balloon angioplastyofright superficial femoral artery on 09-01-16 by Dr. Trula Slade for instent stenosis.The patient has a history of right superficial femoral artery stenting for claudication.   He has no claudication symptoms with walking, no signs of ischemia in his feet or legs.  Today's ABI's are normal with all triphasic waveforms, TBI's are normal.  Right arterial duplex today shows no significant stenosis, all triphasic waveforms.   Thick toenails, DM, PAD: referral to podiatrist in [place] where pt lives [facility]  at pt request).     PLAN:  Based on the patient's vascular studies and examination, pt will return to clinic in 1 year with right LE arterial duplex and ABI's.  I advised him to notify [facility] is he develops concerns re the circulation in his feet or legs.   I advised him to walk a total of at least 30 minutes daily.   I discussed in depth with the patient the nature of atherosclerosis, and emphasized the importance of maximal medical management including strict control of blood pressure, blood glucose, and lipid levels, obtaining regular exercise, and continued cessation of smoking.  The patient is aware that without maximal medical management the underlying atherosclerotic disease process will progress, limiting the benefit of any  interventions.  The patient was given information about PAD including signs, symptoms, treatment, what symptoms should prompt the patient to seek immediate medical care, and risk reduction measures to take.  Clemon Chambers, RN, MSN, FNP-C [facility] [employer] Phone: [phone]  Clinic MD: Scot Dock  02/16/19 9:01 AM

## 2019-02-16 NOTE — Patient Instructions (Signed)
Peripheral Vascular Disease  Peripheral vascular disease (PVD) is a disease of the blood vessels that are not part of your heart and brain. A simple term for PVD is poor circulation. In most cases, PVD narrows the blood vessels that carry blood from your heart to the rest of your body. This can reduce the supply of blood to your arms, legs, and internal organs, like your stomach or kidneys. However, PVD most often affects a person's lower legs and feet. Without treatment, PVD tends to get worse. PVD can also lead to acute ischemic limb. This is when an arm or leg suddenly cannot get enough blood. This is a medical emergency. Follow these instructions at home: Lifestyle  Do not use any products that contain nicotine or tobacco, such as cigarettes and e-cigarettes. If you need help quitting, ask your doctor.  Lose weight if you are overweight. Or, stay at a healthy weight as told by your doctor.  Eat a diet that is low in fat and cholesterol. If you need help, ask your doctor.  Exercise regularly. Ask your doctor for activities that are right for you. General instructions  Take over-the-counter and prescription medicines only as told by your doctor.  Take good care of your feet: ? Wear comfortable shoes that fit well. ? Check your feet often for any cuts or sores.  Keep all follow-up visits as told by your doctor This is important. Contact a doctor if:  You have cramps in your legs when you walk.  You have leg pain when you are at rest.  You have coldness in a leg or foot.  Your skin changes.  You are unable to get or have an erection (erectile dysfunction).  You have cuts or sores on your feet that do not heal. Get help right away if:  Your arm or leg turns cold, numb, and blue.  Your arms or legs become red, warm, swollen, painful, or numb.  You have chest pain.  You have trouble breathing.  You suddenly have weakness in your face, arm, or leg.  You become very  confused or you cannot speak.  You suddenly have a very bad headache.  You suddenly cannot see. Summary  Peripheral vascular disease (PVD) is a disease of the blood vessels.  A simple term for PVD is poor circulation. Without treatment, PVD tends to get worse.  Treatment may include exercise, low fat and low cholesterol diet, and quitting smoking. This information is not intended to replace advice given to you by your health care provider. Make sure you discuss any questions you have with your health care provider. Document Released: 05/13/2009 Document Revised: 01/29/2017 Document Reviewed: 03/26/2016 Elsevier Patient Education  2020 [employer]  

## 2019-03-06 ENCOUNTER — Other Ambulatory Visit: Payer: Self-pay | Admitting: *Deleted

## 2019-03-06 DIAGNOSIS — Z9862 Peripheral vascular angioplasty status: Secondary | ICD-10-CM

## 2019-03-06 DIAGNOSIS — I779 Disorder of arteries and arterioles, unspecified: Secondary | ICD-10-CM

## 2019-03-20 DIAGNOSIS — L851 Acquired keratosis [keratoderma] palmaris et plantaris: Secondary | ICD-10-CM | POA: Diagnosis not present

## 2019-03-20 DIAGNOSIS — E119 Type 2 diabetes mellitus without complications: Secondary | ICD-10-CM | POA: Diagnosis not present

## 2019-03-20 DIAGNOSIS — B351 Tinea unguium: Secondary | ICD-10-CM | POA: Diagnosis not present

## 2019-03-28 ENCOUNTER — Other Ambulatory Visit: Payer: Self-pay | Admitting: Surgery

## 2019-03-28 DIAGNOSIS — I739 Peripheral vascular disease, unspecified: Secondary | ICD-10-CM

## 2019-04-06 DIAGNOSIS — N401 Enlarged prostate with lower urinary tract symptoms: Secondary | ICD-10-CM | POA: Diagnosis not present

## 2019-04-06 DIAGNOSIS — G8929 Other chronic pain: Secondary | ICD-10-CM | POA: Diagnosis not present

## 2019-04-06 DIAGNOSIS — R0981 Nasal congestion: Secondary | ICD-10-CM | POA: Diagnosis not present

## 2019-04-06 DIAGNOSIS — C61 Malignant neoplasm of prostate: Secondary | ICD-10-CM | POA: Diagnosis not present

## 2019-04-06 DIAGNOSIS — F419 Anxiety disorder, unspecified: Secondary | ICD-10-CM | POA: Diagnosis not present

## 2019-04-06 DIAGNOSIS — M25512 Pain in left shoulder: Secondary | ICD-10-CM | POA: Diagnosis not present

## 2019-04-06 DIAGNOSIS — Z23 Encounter for immunization: Secondary | ICD-10-CM | POA: Diagnosis not present

## 2019-04-06 DIAGNOSIS — E1122 Type 2 diabetes mellitus with diabetic chronic kidney disease: Secondary | ICD-10-CM | POA: Diagnosis not present

## 2019-04-06 DIAGNOSIS — I10 Essential (primary) hypertension: Secondary | ICD-10-CM | POA: Diagnosis not present

## 2019-04-06 DIAGNOSIS — E782 Mixed hyperlipidemia: Secondary | ICD-10-CM | POA: Diagnosis not present

## 2019-04-06 DIAGNOSIS — L409 Psoriasis, unspecified: Secondary | ICD-10-CM | POA: Diagnosis not present

## 2019-04-06 DIAGNOSIS — Z794 Long term (current) use of insulin: Secondary | ICD-10-CM | POA: Diagnosis not present

## 2019-05-02 DIAGNOSIS — Z23 Encounter for immunization: Secondary | ICD-10-CM | POA: Diagnosis not present

## 2019-05-16 DIAGNOSIS — Z794 Long term (current) use of insulin: Secondary | ICD-10-CM | POA: Diagnosis not present

## 2019-05-16 DIAGNOSIS — E119 Type 2 diabetes mellitus without complications: Secondary | ICD-10-CM | POA: Diagnosis not present

## 2019-05-16 DIAGNOSIS — H40013 Open angle with borderline findings, low risk, bilateral: Secondary | ICD-10-CM | POA: Diagnosis not present

## 2019-05-16 DIAGNOSIS — Z961 Presence of intraocular lens: Secondary | ICD-10-CM | POA: Diagnosis not present

## 2019-05-29 DIAGNOSIS — E1151 Type 2 diabetes mellitus with diabetic peripheral angiopathy without gangrene: Secondary | ICD-10-CM | POA: Diagnosis not present

## 2019-05-29 DIAGNOSIS — L851 Acquired keratosis [keratoderma] palmaris et plantaris: Secondary | ICD-10-CM | POA: Diagnosis not present

## 2019-05-29 DIAGNOSIS — B351 Tinea unguium: Secondary | ICD-10-CM | POA: Diagnosis not present

## 2019-05-30 DIAGNOSIS — Z23 Encounter for immunization: Secondary | ICD-10-CM | POA: Diagnosis not present

## 2019-06-13 DIAGNOSIS — R109 Unspecified abdominal pain: Secondary | ICD-10-CM | POA: Diagnosis not present

## 2019-06-13 DIAGNOSIS — N200 Calculus of kidney: Secondary | ICD-10-CM | POA: Diagnosis not present

## 2019-06-15 DIAGNOSIS — L400 Psoriasis vulgaris: Secondary | ICD-10-CM | POA: Diagnosis not present

## 2019-07-10 DIAGNOSIS — C61 Malignant neoplasm of prostate: Secondary | ICD-10-CM | POA: Diagnosis not present

## 2019-07-11 DIAGNOSIS — E875 Hyperkalemia: Secondary | ICD-10-CM | POA: Diagnosis not present

## 2019-07-11 DIAGNOSIS — Z794 Long term (current) use of insulin: Secondary | ICD-10-CM | POA: Diagnosis not present

## 2019-07-11 DIAGNOSIS — Z5181 Encounter for therapeutic drug level monitoring: Secondary | ICD-10-CM | POA: Diagnosis not present

## 2019-07-11 DIAGNOSIS — E1151 Type 2 diabetes mellitus with diabetic peripheral angiopathy without gangrene: Secondary | ICD-10-CM | POA: Diagnosis not present

## 2019-07-18 DIAGNOSIS — C61 Malignant neoplasm of prostate: Secondary | ICD-10-CM | POA: Diagnosis not present

## 2019-08-07 DIAGNOSIS — L851 Acquired keratosis [keratoderma] palmaris et plantaris: Secondary | ICD-10-CM | POA: Diagnosis not present

## 2019-08-07 DIAGNOSIS — B351 Tinea unguium: Secondary | ICD-10-CM | POA: Diagnosis not present

## 2019-08-07 DIAGNOSIS — E1151 Type 2 diabetes mellitus with diabetic peripheral angiopathy without gangrene: Secondary | ICD-10-CM | POA: Diagnosis not present

## 2019-09-01 IMAGING — MR MR WRIST*R* W/O CM
6 series · 40 of 40 positions shown · non-contrast
Comparison: None.

CLINICAL DATA: Wrist injury 1 year ago after a fall. Persistent
pain and swelling. The most significant pain is over the radial
aspect of the wrist which is marked with a vitamin-E capsule.

EXAM:
MR OF THE RIGHT WRIST WITHOUT CONTRAST
TECHNIQUE: Multiplanar, multisequence MR imaging of the right wrist was
performed. No intravenous contrast was administered.

[Series 4: T2 fat-sat · axial · right · 3.0mm · 0.31mm/px · z∈[-140,-71]mm · 9 of 25 slices shown (1 of 2)]
[im 1/25]
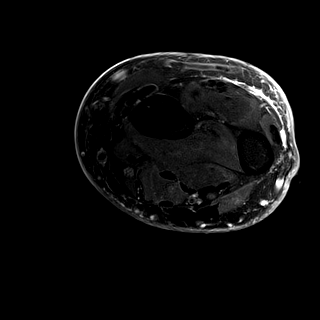
[im 4/25]
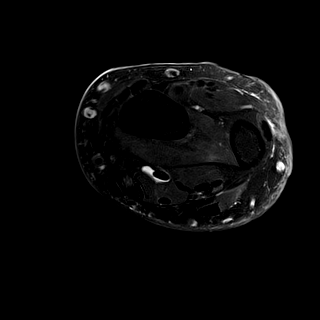
[im 7/25]
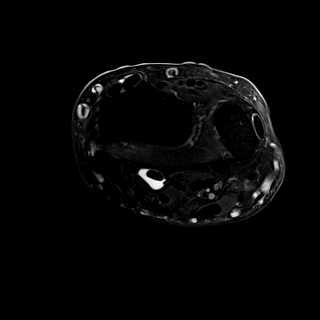
[im 10/25]
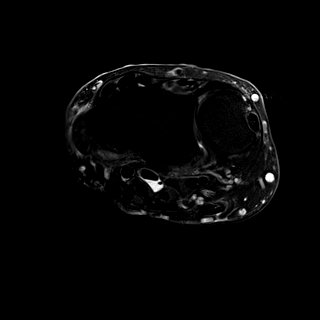
[im 13/25]
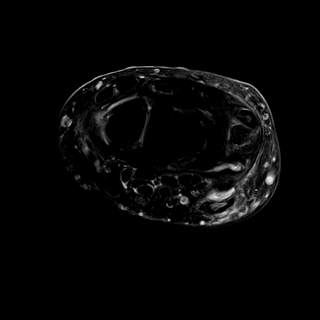
[im 16/25]
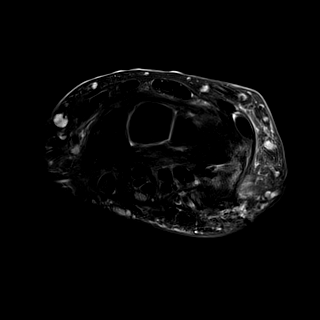
[im 19/25]
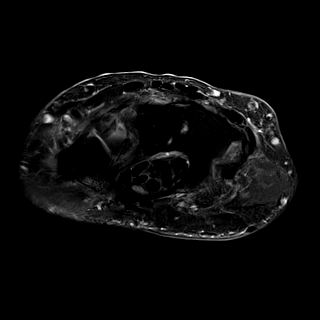
[im 22/25]
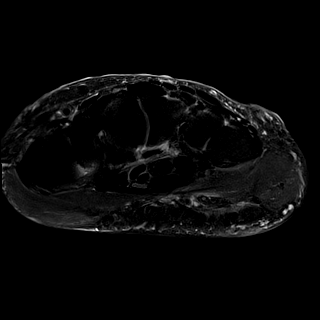
[im 25/25]
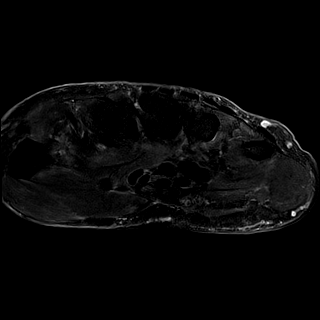

[Series 5: T1 · axial · right · 3.0mm · 0.31mm/px · z∈[-140,-71]mm · 9 of 25 slices shown (1 of 2)]
[im 1/25]
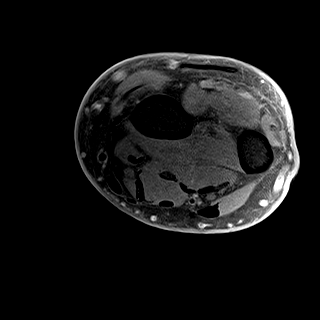
[im 4/25]
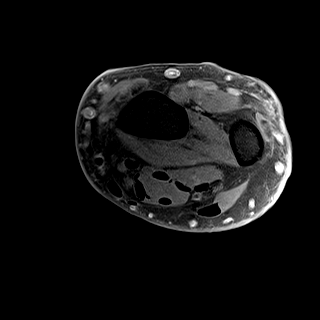
[im 7/25]
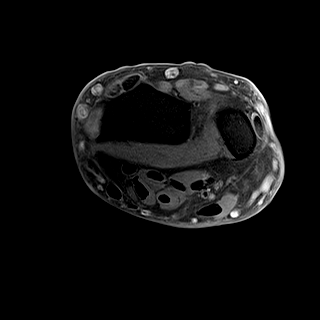
[im 10/25]
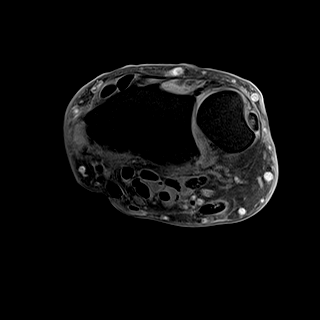
[im 13/25]
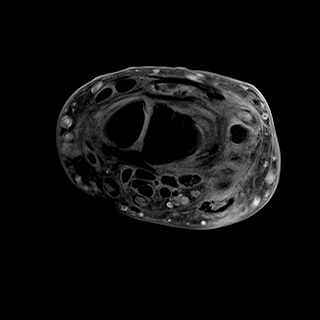
[im 16/25]
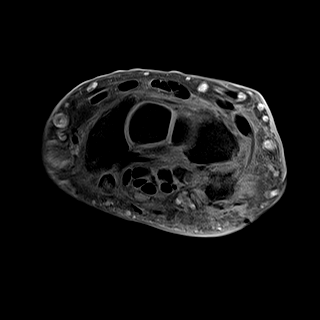
[im 19/25]
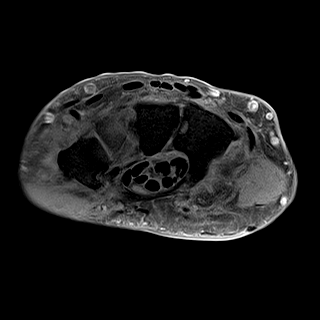
[im 22/25]
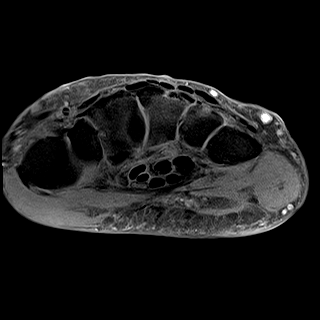
[im 25/25]
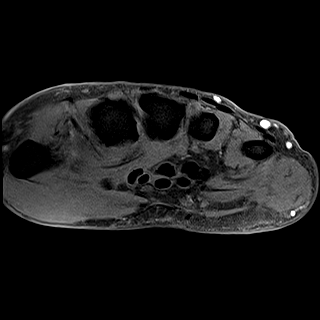

[Series 6: T1 · coronal · right · 3.0mm · 0.26mm/px · 5 of 14 slices shown (2 of 2)]
[im 1/14]
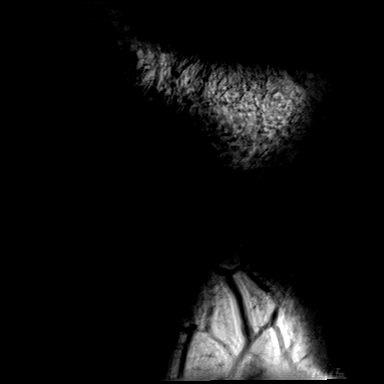
[im 4/14]
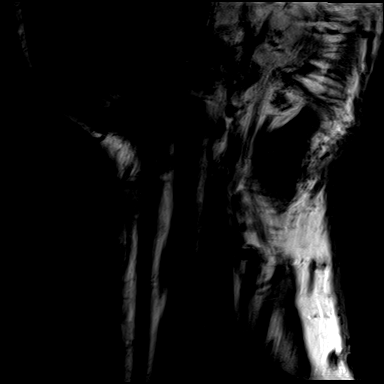
[im 7/14]
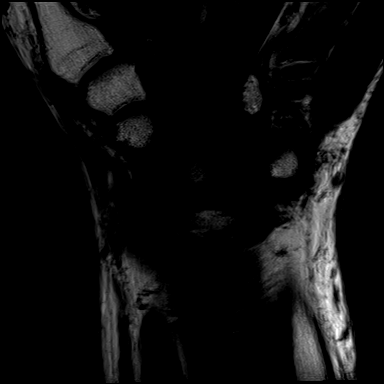
[im 10/14]
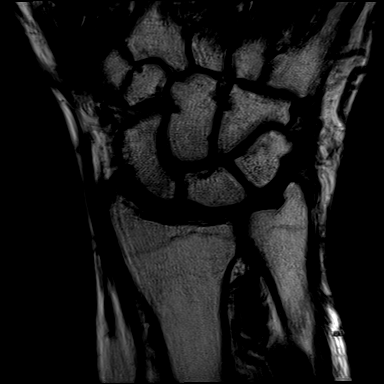
[im 14/14]
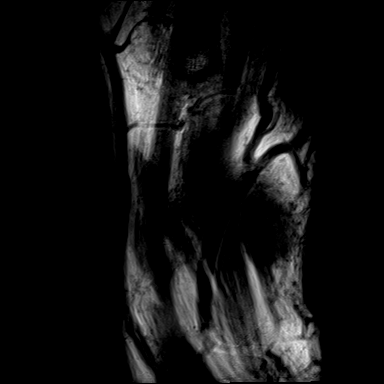

[Series 8: PD fat-sat · coronal · right · 3.0mm · 0.31mm/px · 5 of 14 slices shown (1 of 2)]
[im 1/14]
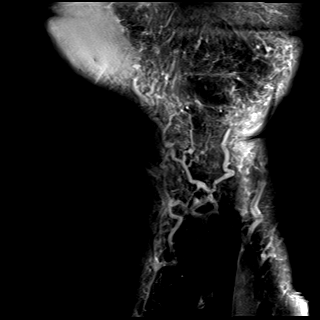
[im 4/14]
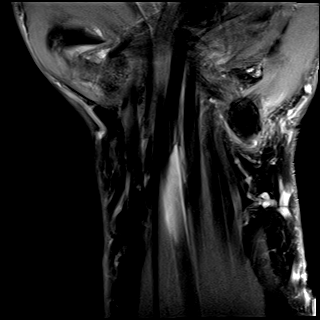
[im 7/14]
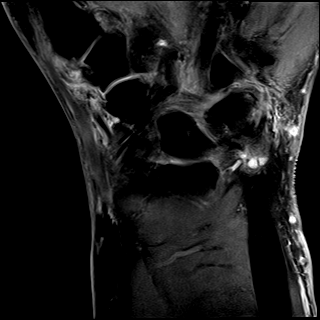
[im 10/14]
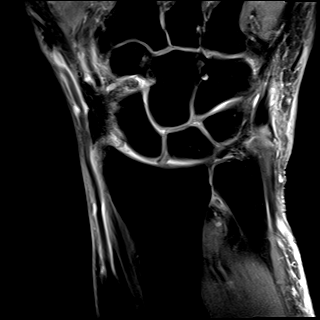
[im 14/14]
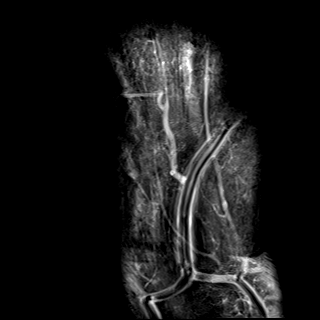

[Series 9: PD fat-sat · oblique · right · 3.0mm · 0.31mm/px · 7 of 21 slices shown (2 of 2)]
[im 1/21]
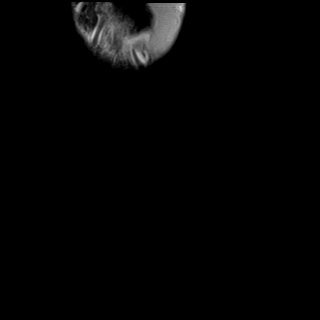
[im 4/21]
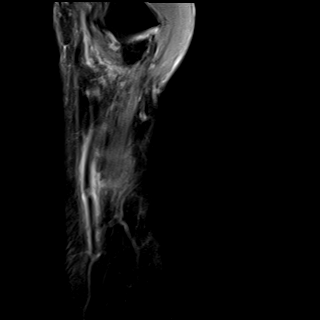
[im 7/21]
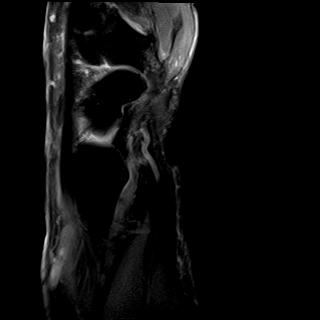
[im 11/21]
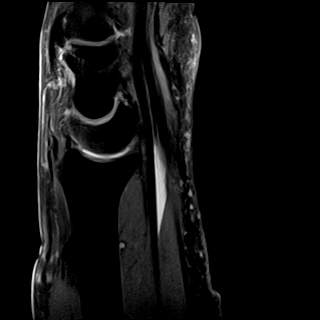
[im 14/21]
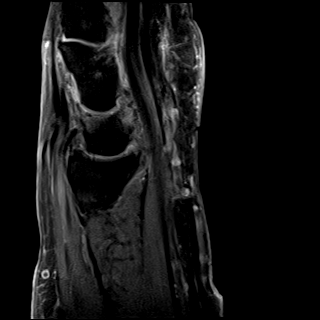
[im 17/21]
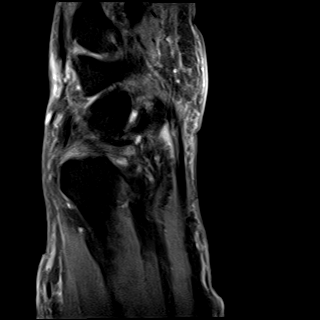
[im 21/21]
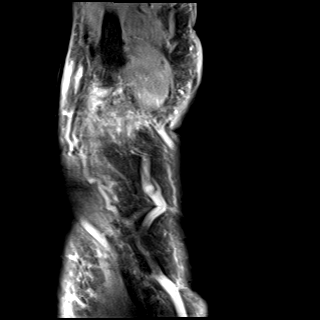

[Series 10: T2 fat-sat · coronal · right · 3.0mm · 0.31mm/px · 5 of 14 slices shown (2 of 2)]
[im 1/14]
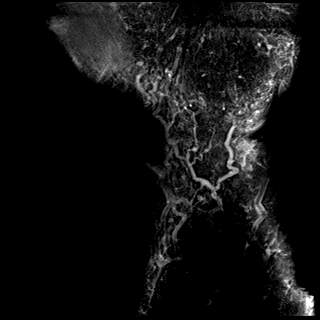
[im 4/14]
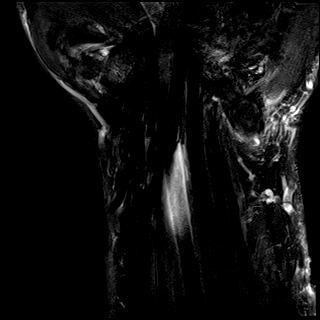
[im 7/14]
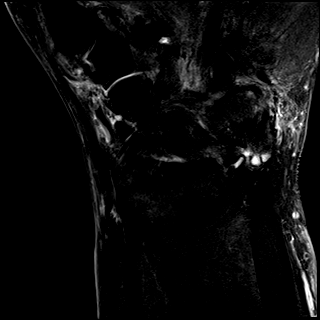
[im 10/14]
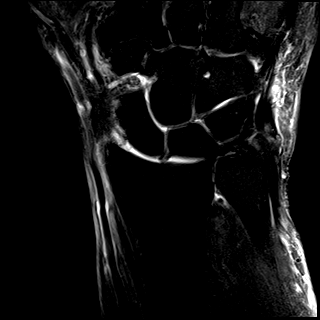
[im 14/14]
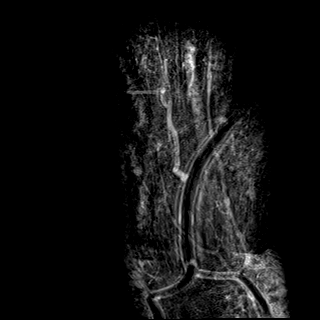

[40 of 40 positions shown; findings below may reference images not displayed]

FINDINGS: Ligaments: The intercarpal ligaments are intact. The intercarpal
joint spaces are maintained.

Triangular fibrocartilage: Degenerative fraying type changes. A
small tear is demonstrated near the attachment to the radial
articular cartilage with a small amount of associated fluid in the
radioulnar joint.

Tendons: Significant tendinopathy involving the APL and EPB tendons
(de Quervain tenosynovitis). No rupture is identified.

Moderate tendinopathy involving the extensor carpi ulnaris with
small interstitial tears and mild tenosynovitis. No complete
tear/rupture.

The vitamin-E capsule is marking the area of the flexor carpi
radialis tendon. The tendon is thickened and demonstrates
tendinopathy with interstitial tears. No complete tear/rupture.

Severe tendinopathy and partial thickness tearing of the flexor
carpi ulnaris tendon distally and at its attachment site. There is
associated bursitis near the attachment site.

Carpal tunnel/median nerve: The carpal tunnel structures appear
normal. There is mild tenosynovitis involving the flexor digitorum
profundus. The median nerve appears normal.

Guyon's canal: Normal

Joint/cartilage: Mild to moderate degenerative changes with small
joint effusion and mild synovitis. Few scattered subchondral cysts
but no definite erosions.

Bones/carpal alignment: No bone contusion, marrow edema or evidence
of AVN.

Other: Normal appearance of the hand and wrist musculature.
IMPRESSION: 1. Significant tendinopathy involving the APL and EPB tendons (de
Quervain tenosynovitis).
2. Moderate tendinopathy involving the extensor carpi ulnaris.
3. Moderate chronic appearing tendinopathy involving the flexor
carpi radialis tendon which appears to correlate with the patient's
area of pain.
4. Severe tendinopathy and partial thickness tearing of the flexor
carpi ulnaris tendon distally along with bursitis at the attachment
site.
5. Mild tenosynovitis involving the flexor digitorum profundus
tendons in the carpal tunnel.
6. Intact intercarpal ligaments and no acute bony findings.
7. Small TFCC tear near the radial attachment site.

## 2019-09-22 DIAGNOSIS — S40812A Abrasion of left upper arm, initial encounter: Secondary | ICD-10-CM | POA: Diagnosis not present

## 2019-09-22 DIAGNOSIS — R0781 Pleurodynia: Secondary | ICD-10-CM | POA: Diagnosis not present

## 2019-10-10 DIAGNOSIS — E782 Mixed hyperlipidemia: Secondary | ICD-10-CM | POA: Diagnosis not present

## 2019-10-10 DIAGNOSIS — I739 Peripheral vascular disease, unspecified: Secondary | ICD-10-CM | POA: Diagnosis not present

## 2019-10-10 DIAGNOSIS — E1151 Type 2 diabetes mellitus with diabetic peripheral angiopathy without gangrene: Secondary | ICD-10-CM | POA: Diagnosis not present

## 2019-10-10 DIAGNOSIS — I10 Essential (primary) hypertension: Secondary | ICD-10-CM | POA: Diagnosis not present

## 2019-10-10 DIAGNOSIS — Z0000 Encounter for general adult medical examination without abnormal findings: Secondary | ICD-10-CM | POA: Diagnosis not present

## 2019-10-10 DIAGNOSIS — Z794 Long term (current) use of insulin: Secondary | ICD-10-CM | POA: Diagnosis not present

## 2019-10-11 DIAGNOSIS — Z794 Long term (current) use of insulin: Secondary | ICD-10-CM | POA: Diagnosis not present

## 2019-10-11 DIAGNOSIS — E1151 Type 2 diabetes mellitus with diabetic peripheral angiopathy without gangrene: Secondary | ICD-10-CM | POA: Diagnosis not present

## 2019-10-11 DIAGNOSIS — E1165 Type 2 diabetes mellitus with hyperglycemia: Secondary | ICD-10-CM | POA: Diagnosis not present

## 2019-10-16 DIAGNOSIS — L851 Acquired keratosis [keratoderma] palmaris et plantaris: Secondary | ICD-10-CM | POA: Diagnosis not present

## 2019-10-16 DIAGNOSIS — B351 Tinea unguium: Secondary | ICD-10-CM | POA: Diagnosis not present

## 2019-10-16 DIAGNOSIS — E1151 Type 2 diabetes mellitus with diabetic peripheral angiopathy without gangrene: Secondary | ICD-10-CM | POA: Diagnosis not present

## 2019-10-26 ENCOUNTER — Telehealth: Payer: Self-pay

## 2019-10-26 NOTE — Telephone Encounter (Signed)
Pt called with c/o bilateral hand/finger swelling, occasional numbess and "fingers turning under" x6 months or more. He saw his PCP who was unsure what it is but did not feel it was arthritis related. Per PA, he has been advised to f/u with pcp and/or rheumatology. I explained to him after workup, if they feel this is a potential vascular issue then we will get him scheduled. He will call us back if he needs further assistance and verbalized understanding.

## 2019-10-30 DIAGNOSIS — Z23 Encounter for immunization: Secondary | ICD-10-CM | POA: Diagnosis not present

## 2019-10-30 DIAGNOSIS — M06349 Rheumatoid nodule, unspecified hand: Secondary | ICD-10-CM | POA: Diagnosis not present

## 2019-10-30 DIAGNOSIS — M25542 Pain in joints of left hand: Secondary | ICD-10-CM | POA: Diagnosis not present

## 2019-10-30 DIAGNOSIS — M25541 Pain in joints of right hand: Secondary | ICD-10-CM | POA: Diagnosis not present

## 2019-11-09 ENCOUNTER — Other Ambulatory Visit: Payer: Self-pay | Admitting: Interventional Cardiology

## 2019-11-20 DIAGNOSIS — Z8679 Personal history of other diseases of the circulatory system: Secondary | ICD-10-CM | POA: Diagnosis not present

## 2019-11-20 DIAGNOSIS — K5900 Constipation, unspecified: Secondary | ICD-10-CM | POA: Diagnosis not present

## 2019-11-20 DIAGNOSIS — Z86010 Personal history of colonic polyps: Secondary | ICD-10-CM | POA: Diagnosis not present

## 2019-12-05 ENCOUNTER — Other Ambulatory Visit: Payer: Self-pay | Admitting: Interventional Cardiology

## 2019-12-12 DIAGNOSIS — Z794 Long term (current) use of insulin: Secondary | ICD-10-CM | POA: Diagnosis not present

## 2019-12-12 DIAGNOSIS — E1165 Type 2 diabetes mellitus with hyperglycemia: Secondary | ICD-10-CM | POA: Diagnosis not present

## 2019-12-12 DIAGNOSIS — R5383 Other fatigue: Secondary | ICD-10-CM | POA: Diagnosis not present

## 2019-12-12 DIAGNOSIS — Z6833 Body mass index (BMI) 33.0-33.9, adult: Secondary | ICD-10-CM | POA: Diagnosis not present

## 2019-12-12 DIAGNOSIS — L4050 Arthropathic psoriasis, unspecified: Secondary | ICD-10-CM | POA: Diagnosis not present

## 2019-12-12 DIAGNOSIS — E669 Obesity, unspecified: Secondary | ICD-10-CM | POA: Diagnosis not present

## 2019-12-12 DIAGNOSIS — L400 Psoriasis vulgaris: Secondary | ICD-10-CM | POA: Diagnosis not present

## 2020-01-08 DIAGNOSIS — L851 Acquired keratosis [keratoderma] palmaris et plantaris: Secondary | ICD-10-CM | POA: Diagnosis not present

## 2020-01-08 DIAGNOSIS — E1151 Type 2 diabetes mellitus with diabetic peripheral angiopathy without gangrene: Secondary | ICD-10-CM | POA: Diagnosis not present

## 2020-01-08 DIAGNOSIS — B351 Tinea unguium: Secondary | ICD-10-CM | POA: Diagnosis not present

## 2020-01-11 DIAGNOSIS — Z8546 Personal history of malignant neoplasm of prostate: Secondary | ICD-10-CM | POA: Diagnosis not present

## 2020-01-11 DIAGNOSIS — E1151 Type 2 diabetes mellitus with diabetic peripheral angiopathy without gangrene: Secondary | ICD-10-CM | POA: Diagnosis not present

## 2020-01-11 DIAGNOSIS — Z794 Long term (current) use of insulin: Secondary | ICD-10-CM | POA: Diagnosis not present

## 2020-01-11 DIAGNOSIS — E1165 Type 2 diabetes mellitus with hyperglycemia: Secondary | ICD-10-CM | POA: Diagnosis not present

## 2020-01-17 DIAGNOSIS — R52 Pain, unspecified: Secondary | ICD-10-CM | POA: Diagnosis not present

## 2020-01-17 DIAGNOSIS — R2689 Other abnormalities of gait and mobility: Secondary | ICD-10-CM | POA: Diagnosis not present

## 2020-01-17 DIAGNOSIS — M6281 Muscle weakness (generalized): Secondary | ICD-10-CM | POA: Diagnosis not present

## 2020-01-17 DIAGNOSIS — R262 Difficulty in walking, not elsewhere classified: Secondary | ICD-10-CM | POA: Diagnosis not present

## 2020-01-23 DIAGNOSIS — E1165 Type 2 diabetes mellitus with hyperglycemia: Secondary | ICD-10-CM | POA: Diagnosis not present

## 2020-01-23 DIAGNOSIS — Z6833 Body mass index (BMI) 33.0-33.9, adult: Secondary | ICD-10-CM | POA: Diagnosis not present

## 2020-01-23 DIAGNOSIS — L400 Psoriasis vulgaris: Secondary | ICD-10-CM | POA: Diagnosis not present

## 2020-01-23 DIAGNOSIS — E669 Obesity, unspecified: Secondary | ICD-10-CM | POA: Diagnosis not present

## 2020-01-23 DIAGNOSIS — Z794 Long term (current) use of insulin: Secondary | ICD-10-CM | POA: Diagnosis not present

## 2020-01-23 DIAGNOSIS — L4050 Arthropathic psoriasis, unspecified: Secondary | ICD-10-CM | POA: Diagnosis not present

## 2020-01-24 DIAGNOSIS — C61 Malignant neoplasm of prostate: Secondary | ICD-10-CM | POA: Diagnosis not present

## 2020-01-24 DIAGNOSIS — E1142 Type 2 diabetes mellitus with diabetic polyneuropathy: Secondary | ICD-10-CM | POA: Diagnosis not present

## 2020-01-24 DIAGNOSIS — I10 Essential (primary) hypertension: Secondary | ICD-10-CM | POA: Diagnosis not present

## 2020-01-24 DIAGNOSIS — E1151 Type 2 diabetes mellitus with diabetic peripheral angiopathy without gangrene: Secondary | ICD-10-CM | POA: Diagnosis not present

## 2020-01-24 DIAGNOSIS — G8929 Other chronic pain: Secondary | ICD-10-CM | POA: Diagnosis not present

## 2020-01-24 DIAGNOSIS — E1122 Type 2 diabetes mellitus with diabetic chronic kidney disease: Secondary | ICD-10-CM | POA: Diagnosis not present

## 2020-01-24 DIAGNOSIS — N181 Chronic kidney disease, stage 1: Secondary | ICD-10-CM | POA: Diagnosis not present

## 2020-01-24 DIAGNOSIS — E1165 Type 2 diabetes mellitus with hyperglycemia: Secondary | ICD-10-CM | POA: Diagnosis not present

## 2020-01-24 DIAGNOSIS — Z8546 Personal history of malignant neoplasm of prostate: Secondary | ICD-10-CM | POA: Diagnosis not present

## 2020-01-24 DIAGNOSIS — E782 Mixed hyperlipidemia: Secondary | ICD-10-CM | POA: Diagnosis not present

## 2020-01-29 DIAGNOSIS — R52 Pain, unspecified: Secondary | ICD-10-CM | POA: Diagnosis not present

## 2020-01-29 DIAGNOSIS — R2689 Other abnormalities of gait and mobility: Secondary | ICD-10-CM | POA: Diagnosis not present

## 2020-01-29 DIAGNOSIS — R262 Difficulty in walking, not elsewhere classified: Secondary | ICD-10-CM | POA: Diagnosis not present

## 2020-01-29 DIAGNOSIS — M6281 Muscle weakness (generalized): Secondary | ICD-10-CM | POA: Diagnosis not present

## 2020-02-02 DIAGNOSIS — R2689 Other abnormalities of gait and mobility: Secondary | ICD-10-CM | POA: Diagnosis not present

## 2020-02-02 DIAGNOSIS — R262 Difficulty in walking, not elsewhere classified: Secondary | ICD-10-CM | POA: Diagnosis not present

## 2020-02-02 DIAGNOSIS — M6281 Muscle weakness (generalized): Secondary | ICD-10-CM | POA: Diagnosis not present

## 2020-02-02 DIAGNOSIS — R52 Pain, unspecified: Secondary | ICD-10-CM | POA: Diagnosis not present

## 2020-02-05 DIAGNOSIS — R262 Difficulty in walking, not elsewhere classified: Secondary | ICD-10-CM | POA: Diagnosis not present

## 2020-02-05 DIAGNOSIS — M6281 Muscle weakness (generalized): Secondary | ICD-10-CM | POA: Diagnosis not present

## 2020-02-05 DIAGNOSIS — R52 Pain, unspecified: Secondary | ICD-10-CM | POA: Diagnosis not present

## 2020-02-05 DIAGNOSIS — R2689 Other abnormalities of gait and mobility: Secondary | ICD-10-CM | POA: Diagnosis not present

## 2020-02-08 DIAGNOSIS — M6281 Muscle weakness (generalized): Secondary | ICD-10-CM | POA: Diagnosis not present

## 2020-02-08 DIAGNOSIS — R2689 Other abnormalities of gait and mobility: Secondary | ICD-10-CM | POA: Diagnosis not present

## 2020-02-08 DIAGNOSIS — R52 Pain, unspecified: Secondary | ICD-10-CM | POA: Diagnosis not present

## 2020-02-08 DIAGNOSIS — R262 Difficulty in walking, not elsewhere classified: Secondary | ICD-10-CM | POA: Diagnosis not present

## 2020-02-09 DIAGNOSIS — L4050 Arthropathic psoriasis, unspecified: Secondary | ICD-10-CM | POA: Diagnosis not present

## 2020-02-12 DIAGNOSIS — M6281 Muscle weakness (generalized): Secondary | ICD-10-CM | POA: Diagnosis not present

## 2020-02-12 DIAGNOSIS — R262 Difficulty in walking, not elsewhere classified: Secondary | ICD-10-CM | POA: Diagnosis not present

## 2020-02-12 DIAGNOSIS — R52 Pain, unspecified: Secondary | ICD-10-CM | POA: Diagnosis not present

## 2020-02-12 DIAGNOSIS — R2689 Other abnormalities of gait and mobility: Secondary | ICD-10-CM | POA: Diagnosis not present

## 2020-02-15 DIAGNOSIS — C61 Malignant neoplasm of prostate: Secondary | ICD-10-CM | POA: Diagnosis not present

## 2020-02-21 DIAGNOSIS — M6281 Muscle weakness (generalized): Secondary | ICD-10-CM | POA: Diagnosis not present

## 2020-02-21 DIAGNOSIS — R2689 Other abnormalities of gait and mobility: Secondary | ICD-10-CM | POA: Diagnosis not present

## 2020-02-21 DIAGNOSIS — R52 Pain, unspecified: Secondary | ICD-10-CM | POA: Diagnosis not present

## 2020-02-21 DIAGNOSIS — R262 Difficulty in walking, not elsewhere classified: Secondary | ICD-10-CM | POA: Diagnosis not present

## 2020-02-27 DIAGNOSIS — R262 Difficulty in walking, not elsewhere classified: Secondary | ICD-10-CM | POA: Diagnosis not present

## 2020-02-27 DIAGNOSIS — M6281 Muscle weakness (generalized): Secondary | ICD-10-CM | POA: Diagnosis not present

## 2020-02-27 DIAGNOSIS — R2689 Other abnormalities of gait and mobility: Secondary | ICD-10-CM | POA: Diagnosis not present

## 2020-02-27 DIAGNOSIS — R52 Pain, unspecified: Secondary | ICD-10-CM | POA: Diagnosis not present

## 2020-02-28 ENCOUNTER — Ambulatory Visit (HOSPITAL_COMMUNITY)
Admission: RE | Admit: 2020-02-28 | Discharge: 2020-02-28 | Disposition: A | Discharge status: Home / Self Care | Payer: Medicare Other | Source: Ambulatory Visit | Attending: Physician Assistant | Admitting: Physician Assistant

## 2020-02-28 ENCOUNTER — Other Ambulatory Visit: Payer: Self-pay | Admitting: Surgery

## 2020-02-28 ENCOUNTER — Ambulatory Visit (INDEPENDENT_AMBULATORY_CARE_PROVIDER_SITE_OTHER)
Admission: RE | Admit: 2020-02-28 | Discharge: 2020-02-28 | Disposition: A | Discharge status: Home / Self Care | Payer: Medicare Other | Source: Ambulatory Visit | Attending: Physician Assistant | Admitting: Physician Assistant

## 2020-02-28 ENCOUNTER — Ambulatory Visit (INDEPENDENT_AMBULATORY_CARE_PROVIDER_SITE_OTHER): Payer: Medicare Other | Admitting: Physician Assistant

## 2020-02-28 ENCOUNTER — Other Ambulatory Visit: Payer: Self-pay

## 2020-02-28 VITALS — BP 135/73 | HR 104 | Temp 97.4°F | Resp 20 | Ht 71.0 in | Wt 231.6 lb

## 2020-02-28 DIAGNOSIS — I779 Disorder of arteries and arterioles, unspecified: Secondary | ICD-10-CM

## 2020-02-28 DIAGNOSIS — Z9862 Peripheral vascular angioplasty status: Secondary | ICD-10-CM

## 2020-02-28 DIAGNOSIS — I739 Peripheral vascular disease, unspecified: Secondary | ICD-10-CM | POA: Diagnosis not present

## 2020-02-28 NOTE — Progress Notes (Signed)
Office Note     CC:  follow up Requesting Provider:  Shirleen Schirmer, PA-C  HPI: Michael [name] is a 69 y.o. (11-17-1950) male who was initially seen by [name] for right leg claudication.Dr. Myra Gianotti subsequently placed a right superficial femoral artery stent in September 2012. On 09/01/2016 he underwent atherectomy with drug-coated balloon angioplasty for in-stent stenosis within the right superficial femoral artery. On March 16, 2017 he underwent stenting of his right superficial femoral artery for in-stent stenosis, after suboptimal results with angioplasty. 27mm overlappingViabahnstents were placed.  He states he has right buttock pain and has been treated recently by physical therapists.  He says this is improved his pain mobility significantly.  He denies lower extremity pain with walking or activity.  He denies rest pain.  He is able to walk 1 mile daily.  He sees a podiatrist on a regular basis and is currently without foot or lower extremity skin issues.  He is compliant with Lipitor and Plavix. He is diabetic and takes multiple medications. Has a history of essential hypertension maintained on ACE inhibitor, calcium channel blocker and beta-blocker. Denies prior history of tobacco smoking. History of prostate cancer.   Past Medical History:  Diagnosis Date  . Allergy   . Anxiety   . Diabetes mellitus   . Hyperlipidemia   . Hypertension   . PAD (peripheral artery disease) (HCC)    a. s/p prior LE stenting 2012, 03/2017 - followed by [facility].  . Prostate cancer [facility])     Past Surgical History:  Procedure Laterality Date  . ABDOMINAL AORTOGRAM N/A 09/01/2016   Procedure: Abdominal Aortogram;  Surgeon: Nada Libman, MD;  Location: [facility];  Service: Cardiovascular;  Laterality: N/A;  . ABDOMINAL AORTOGRAM W/LOWER EXTREMITY N/A 03/16/2017   Procedure: ABDOMINAL AORTOGRAM W/LOWER EXTREMITY;  Surgeon: Nada Libman, MD;  Location: [facility];   Service: Cardiovascular;  Laterality: N/A;  . ANGIOPLASTY / STENTING FEMORAL  11/05/10   Left SFA stent  . KNEE ARTHROSCOPY    . LOWER EXTREMITY ANGIOGRAPHY N/A 09/01/2016   Procedure: Lower Extremity Angiography;  Surgeon: Nada Libman, MD;  Location: [facility] INVASIVE CV LAB;  Service: Cardiovascular;  Laterality: N/A;  . PERIPHERAL VASCULAR ATHERECTOMY Right 09/01/2016   Procedure: Peripheral Vascular Atherectomy;  Surgeon: Nada Libman, MD;  Location: [facility];  Service: Cardiovascular;  Laterality: Right;  superficial femoral  . PERIPHERAL VASCULAR INTERVENTION Right 03/16/2017   Procedure: PERIPHERAL VASCULAR INTERVENTION;  Surgeon: Nada Libman, MD;  Location: [facility];  Service: Cardiovascular;  Laterality: Right;  superficicial femoral  . ROTATOR CUFF REPAIR     1990    Social History   Socioeconomic History  . Marital status: Married    Spouse name: Not on file  . Number of children: Not on file  . Years of education: Not on file  . Highest education level: Not on file  Occupational History  . Not on file  Tobacco Use  . Smoking status: Never Smoker  . Smokeless tobacco: Former Clinical biochemist  . Vaping Use: Never used  Substance and Sexual Activity  . Alcohol use: Yes    Comment: occasional drink  . Drug use: No  . Sexual activity: Not on file  Other Topics Concern  . Not on file  Social History Narrative  . Not on file   Social Determinants of Health   Financial Resource Strain: Not on file  Food Insecurity:  Not on file  Transportation Needs: Not on file  Physical Activity: Not on file  Stress: Not on file  Social Connections: Not on file  Intimate Partner Violence: Not on file   Family History  Problem Relation Age of Onset  . Hyperlipidemia Mother   . Hypertension Mother   . Heart disease Father   . Deep vein thrombosis Father   . Hyperlipidemia Father   . Hypertension Father   . Heart attack Father   . Peripheral vascular  disease Father   . Cancer Sister     Current Outpatient Medications  Medication Sig Dispense Refill  . ACCU-CHEK AVIVA PLUS test strip AS DIRECTED SEVEN TIMES A DAY IN VITRO 90 DAYS  4  . amLODipine (NORVASC) 5 MG tablet Take 1 tablet (5 mg total) by mouth daily. 90 tablet 3  . atorvastatin (LIPITOR) 40 MG tablet Take 40 mg by mouth at bedtime.  5  . benazepril (LOTENSIN) 40 MG tablet Take 40 mg by mouth daily.    . clonazePAM (KLONOPIN) 0.5 MG tablet TAKE HALF TABLET DAILY AS NEEDED FOR SLEEP  5  . clopidogrel (PLAVIX) 75 MG tablet TAKE 1 TABLET BY MOUTH EVERY DAY WITH BREAKFAST 90 tablet 3  . fexofenadine (ALLEGRA) 180 MG tablet Take 180 mg by mouth daily.    . fluticasone (FLONASE) 50 MCG/ACT nasal spray Place 2 sprays into both nostrils at bedtime as needed (before uses CPAP if needed).     . Homeopathic Products (PSORIASIS/ECZEMA RELIEF EX) Apply 1 application topically daily.    Marland Kitchen HUMALOG KWIKPEN 100 UNIT/ML KwikPen     . hydrochlorothiazide (HYDRODIURIL) 25 MG tablet Take 1 tablet (25 mg total) by mouth daily. Please make overdue appt with Dr. Eldridge Dace before anymore refills. 2nd attempt 15 tablet 0  . ibuprofen (ADVIL,MOTRIN) 200 MG tablet Take 800 mg by mouth daily as needed for mild pain.     Marland Kitchen insulin glargine (LANTUS) 100 UNIT/ML injection Inject 24 Units into the skin at bedtime.    Marland Kitchen JARDIANCE 25 MG TABS tablet Take 25 mg by mouth daily.    . metoprolol succinate (TOPROL-XL) 100 MG 24 hr tablet Take 100 mg by mouth daily.     . solifenacin (VESICARE) 5 MG tablet Take 5 mg by mouth daily.    . tamsulosin (FLOMAX) 0.4 MG CAPS capsule TAKE 1 CAPSULE BY MOUTH EVERY DAY AFTER DINNER  5  . TRULICITY 0.75 MG/0.5ML SOPN Inject 0.75 mg into the skin once a week.    . furosemide (LASIX) 20 MG tablet Take 1 tablet (20 mg total) by mouth every other day. 15 tablet 11   No current facility-administered medications for this visit.    Allergies  Allergen Reactions  . Demerol Anaphylaxis   . Lactose Intolerance (Gi) Anaphylaxis  . Penicillins Anaphylaxis and Other (See Comments)    Has patient had a PCN reaction causing immediate rash, facial/tongue/throat swelling, SOB or lightheadedness with hypotension: Yes Has patient had a PCN reaction causing severe rash involving mucus membranes or skin necrosis: No Has patient had a PCN reaction that required hospitalization: No Has patient had a PCN reaction occurring within the last 10 years: No If all of the above answers are "NO", then may proceed with Cephalosporin use.  Has patient had a PCN reaction causing immediate rash, facial/tongue/throat swelling, SOB or lightheadedness with hypotension: Yes Has patient had a PCN reaction causing severe rash involving mucus membranes or skin necrosis: No Has patient had a PCN reaction  that required hospitalization: No Has patient had a PCN reaction occurring within the last 10 years: No If all of the above answers are "NO", then may proceed with Cephalosporin use. Has patient had a PCN reaction causing immediate rash, facial/tongue/throat swelling, SOB or lightheadedness with hypotension: Yes Has patient had a PCN reaction causing severe rash involving mucus membranes or skin necrosis: No Has patient had a PCN reaction that required hospitalization: No Has patient had a PCN reaction occurring within the last 10 years: No If all of the above answers are "NO", then may proceed with Cephalosporin use.  . Actos [Pioglitazone] Nausea Only  . Exenatide Nausea Only and Other (See Comments)  . Fentanyl Other (See Comments)  . Glimepiride Other (See Comments)    Edema in ankles   . Nabumetone Nausea Only and Other (See Comments)  . Other   . Oxycodone Nausea Only and Other (See Comments)     REVIEW OF SYSTEMS:   [X]  denotes positive finding, [ ]  denotes negative finding Cardiac  Comments:  Chest pain or chest pressure:    Shortness of breath upon exertion:    Short of breath when lying  flat:    Irregular heart rhythm:        Vascular    Pain in calf, thigh, or hip brought on by ambulation: x  see HPI  Pain in feet at night that wakes you up from your sleep:     Blood clot in your veins:    Leg swelling:         Pulmonary    Oxygen at home:    Productive cough:     Wheezing:         Neurologic    Sudden weakness in arms or legs:     Sudden numbness in arms or legs:     Sudden onset of difficulty speaking or slurred speech:    Temporary loss of vision in one eye:     Problems with dizziness:         Gastrointestinal    Blood in stool:     Vomited blood:         Genitourinary    Burning when urinating:     Blood in urine:        Psychiatric    Major depression:         Hematologic    Bleeding problems:    Problems with blood clotting too easily:        Skin    Rashes or ulcers:        Constitutional    Fever or chills:      PHYSICAL EXAMINATION:  Vitals:   02/28/20 1201  BP: 135/73  Pulse: (!) 104  Resp: 20  Temp: (!) 97.4 F (36.3 C)  SpO2: 96%   General:  WDWN in NAD; vital signs documented above Gait: Unaided, mild ataxia HENT: WNL, normocephalic Pulmonary: normal non-labored breathing , without Rales, rhonchi,  wheezing Cardiac: regular HR, without  Murmurs without carotid bruit Abdomen: soft, NT, no masses.  Diastases recti noted. Skin: without rashes Vascular Exam/Pulses: 2+ bilateral brachial, radial pulses.  1+ bilateral femoral, bilateral posterior tibial and left dorsalis pedis pulse.  Right dorsalis pedis pulses weakly palpable. Extremities: without ischemic changes, without Gangrene , without cellulitis; without open wounds;  Musculoskeletal: no muscle wasting or atrophy  Neurologic: A&O X 3;  No focal weakness or paresthesias are detected Psychiatric:  The pt has Normal affect.   Non-Invasive Vascular  Imaging:   02/28/2020 ABI/TBIToday's ABIToday's TBIPrevious ABIPrevious TBI   +-------+-----------+-----------+------------+------------+  Right 0.95    0.72    0.98    0.83      +-------+-----------+-----------+------------+------------+  Left  1.10    0.78    1.13    0.97      +-------+-----------+-----------+------------+------------+ Right great toe pressure 122.  Waveforms are triphasic Left great toe pressure 132.  Waveforms are triphasic  Arterial duplex: Right: 30-49% stenosis noted in the common femoral artery.  50-74% stenosis in the proximal superficial femoral artery.  Unable to delineate superficial femoral artery stent walls due to  calcification. However, the arteries appear patent in their entirety with  triphasic waveforms noted.   ASSESSMENT/PLAN:: 69 y.o. male here for follow up for peripheral arterial disease. He is status post right SFA stent in 2012 with subsequent balloon angioplasty for in-stent stenosis in 2018. On 03/16/2017 he underwent stenting of his right SFA after suboptimal results of angioplasty. Overlapping Viabahn stents were placed.  On exam today, the patient has no tissue compromise. Noninvasive studies reveal preserved ABIs and TBI's. No indication of in-stent stenosis.  He has palpable pedal pulses.  Right hip pain not consistent with claudication.  Continue efforts at good blood pressure and glucose control.  He is seen routinely with his primary care physician. Continue Plavix and statin. Continue routine exercise program. We will follow-up in 1 year with noninvasive studies.  Advised him to call our office sooner should he develop lower extremity pain or skin issues.  Barbie Banner, PA-C [facility] [phone]  Clinic MD:   Dr. Oneida Alar

## 2020-03-03 ENCOUNTER — Other Ambulatory Visit: Payer: Self-pay | Admitting: Vascular Surgery

## 2020-03-03 DIAGNOSIS — I739 Peripheral vascular disease, unspecified: Secondary | ICD-10-CM

## 2020-05-28 NOTE — Progress Notes (Signed)
Cardiology Office Note   Date:  05/29/2020   ID:  Michael [name], DOB 1950-09-10, MRN [id number]  PCP:  Rolene Course, PA-C    No chief complaint on file.  PAD, fatigue  Wt Readings from Last 3 Encounters:  05/29/20 232 lb (105.2 kg)  02/28/20 231 lb 9.6 oz (105.1 kg)  02/16/19 224 lb (101.6 kg)       History of Present Illness: Michael [name] is a 70 y.o. male   with history of lower extremity PAD s/p stenting (followed by [facility]), anxiety, DM, HTN, HLD, obesity, low testosterone who presents for overdue 1 year follow-up and evaluation of LEE  Seen last by Melina Copa in 2019: "He has no formal history of ischemic heart disease. 2D echo 2013 showed EF 60-65%, mild LVH. Stress test in 2014 was normal, EF 70%. He does have PAD and is followed by Dr. Trula Slade. He has no h/o smoking. He has prior hx of LE stenting in 2012 and angioplasty/stenting in 03/2017. Last labs by [facility] 08/2017 showed Tchol 87, LDL 23, trig 158, A1C 9.4, Hgb 14.1, Cr 0.830, K 4.1, ALT 12, TSH 0.450.   He follows his DM very closely, and has been working with Dr. Buddy Duty for medicine adjustments. Apparently he had some diarrheal reactions when Trulicity was increased and he was on Jardiance so he is now on insulin. 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  He tried to increase activity also by getting a job at [employer]. He walks a mile once he gets home.  He got very tired on the first day.    He was treated with hormone treatment for prostate cancer and attributed his fatigue to this.  He has noted some pains in the chest, towards the center and the right side of his chest.  No definitive trigger.   Denies :  Dizziness. Leg edema. Nitroglycerin use. Orthopnea. Palpitations. Paroxysmal nocturnal dyspnea. Syncope.    Taking an infusion for psoriasis.  Has some arthritic changes to hands as well.       Past Medical History:  Diagnosis Date  . Allergy   . Anxiety   . Diabetes mellitus   . Hyperlipidemia   . Hypertension   . PAD (peripheral artery disease) ([place])    a. s/p prior LE stenting 2012, 03/2017 - followed by [facility].  . Prostate cancer [facility])     Past Surgical History:  Procedure Laterality Date  . ABDOMINAL AORTOGRAM N/A 09/01/2016   Procedure: Abdominal Aortogram;  Surgeon: Serafina Mitchell, MD;  Location: [place] CV LAB;  Service: Cardiovascular;  Laterality: N/A;  . ABDOMINAL AORTOGRAM W/LOWER EXTREMITY N/A 03/16/2017   Procedure: ABDOMINAL AORTOGRAM W/LOWER EXTREMITY;  Surgeon: Serafina Mitchell, MD;  Location: [place] CV LAB;  Service: Cardiovascular;  Laterality: N/A;  . ANGIOPLASTY / STENTING FEMORAL  11/05/10   Left SFA stent  . KNEE ARTHROSCOPY    . LOWER EXTREMITY ANGIOGRAPHY N/A 09/01/2016   Procedure: Lower Extremity Angiography;  Surgeon: Serafina Mitchell, MD;  Location: [place] CV LAB;  Service: Cardiovascular;  Laterality: N/A;  . PERIPHERAL VASCULAR ATHERECTOMY Right 09/01/2016   Procedure: Peripheral Vascular Atherectomy;  Surgeon: Serafina Mitchell, MD;  Location: [place] CV LAB;  Service: Cardiovascular;  Laterality: Right;  superficial femoral  . PERIPHERAL VASCULAR INTERVENTION Right 03/16/2017   Procedure: PERIPHERAL VASCULAR INTERVENTION;  Surgeon: Serafina Mitchell, MD;  Location: [place] CV LAB;  Service: Cardiovascular;  Laterality: Right;  superficicial femoral  . ROTATOR CUFF REPAIR     1990     Current Outpatient Medications  Medication Sig Dispense Refill  . ACCU-CHEK AVIVA PLUS test strip AS DIRECTED SEVEN TIMES A DAY IN VITRO 90 DAYS  4  . amLODipine (NORVASC) 10 MG tablet Take 10 mg by mouth daily.    Marland Kitchen atorvastatin (LIPITOR) 40 MG tablet Take 40 mg by mouth at bedtime.  5  . benazepril (LOTENSIN) 40 MG tablet Take 40 mg by mouth daily.    . clonazePAM (KLONOPIN) 0.5 MG tablet TAKE HALF TABLET DAILY AS NEEDED FOR SLEEP  5  .  clopidogrel (PLAVIX) 75 MG tablet TAKE 1 TABLET BY MOUTH EVERY DAY WITH BREAKFAST 90 tablet 3  . Continuous Blood Gluc Receiver (FREESTYLE LIBRE 14 DAY READER) DEVI use to monitor blood sugar    . dapagliflozin propanediol (FARXIGA) 5 MG TABS tablet 1 tablet    . famotidine (PEPCID) 20 MG tablet 1 tablet at bedtime.    . fexofenadine (ALLEGRA) 180 MG tablet Take 180 mg by mouth daily.    . fluticasone (FLONASE) 50 MCG/ACT nasal spray Place 2 sprays into both nostrils at bedtime as needed (before uses CPAP if needed).     . folic acid (FOLVITE) 1 MG tablet 1 tablet    . HUMALOG KWIKPEN 100 UNIT/ML KwikPen     . hydrochlorothiazide (HYDRODIURIL) 25 MG tablet Take 1 tablet (25 mg total) by mouth daily. Please make overdue appt with Dr. Irish Lack before anymore refills. 2nd attempt 15 tablet 0  . ibuprofen (ADVIL,MOTRIN) 200 MG tablet Take 800 mg by mouth daily as needed for mild pain.     Marland Kitchen insulin glargine (LANTUS) 100 UNIT/ML injection Inject 24 Units into the skin at bedtime.    Marland Kitchen JARDIANCE 25 MG TABS tablet Take 25 mg by mouth daily.    . metFORMIN (GLUCOPHAGE-XR) 500 MG 24 hr tablet 4 tablets with evening meal    . methotrexate 2.5 MG tablet Take 15 mg by mouth once a week.    . metoprolol succinate (TOPROL-XL) 100 MG 24 hr tablet Take 100 mg by mouth daily.     . solifenacin (VESICARE) 5 MG tablet Take 5 mg by mouth daily.    . tamsulosin (FLOMAX) 0.4 MG CAPS capsule TAKE 1 CAPSULE BY MOUTH EVERY DAY AFTER DINNER  5  . TRULICITY 9.67 RF/1.6BW SOPN Inject 0.75 mg into the skin once a week.    . furosemide (LASIX) 20 MG tablet Take 1 tablet (20 mg total) by mouth every other day. 15 tablet 11   No current facility-administered medications for this visit.    Allergies:   Demerol, Lactose intolerance (gi), Penicillins, Actos [pioglitazone], Exenatide, Fentanyl, Glimepiride, Nabumetone, Other, and Oxycodone    Social History:  The patient  reports that he has never smoked. He quit smokeless  tobacco use about 38 years ago. He reports current alcohol use. He reports that he does not use drugs.   Family History:  The patient's family history includes Cancer in his sister; Deep vein thrombosis in his father; Heart attack in his father; Heart disease in his father; Hyperlipidemia in his father and mother; Hypertension in his father and mother; Peripheral vascular disease in his father.    ROS:  Please see the history of present illness.   Otherwise, review of systems are positive for fatigue.   All  other systems are reviewed and negative.    PHYSICAL EXAM: VS:  BP 116/78   Pulse 97   Ht 6\' 1"  (1.854 m)   Wt 232 lb (105.2 kg)   BMI 30.61 kg/m  , BMI Body mass index is 30.61 kg/m. GEN: Well nourished, well developed, in no acute distress  HEENT: normal  Neck: no JVD, carotid bruits, or masses Cardiac: RRR; no murmurs, rubs, or gallops,no edema  Respiratory:  clear to auscultation bilaterally, normal work of breathing GI: soft, nontender, nondistended, + BS MS: no deformity or atrophy  Skin: warm and dry, mild rash on left abdomen-psoriasis Neuro:  Strength and sensation are intact Psych: euthymic mood, full affect   EKG:   The ekg ordered today demonstrates NSR, no ST changes   Recent Labs: No results found for requested labs within last 8760 hours.   Lipid Panel    Component Value Date/Time   CHOL 154 12/30/2018 0806   TRIG 298 (H) 12/30/2018 0806   HDL 45 12/30/2018 0806   CHOLHDL 3.4 12/30/2018 0806   LDLCALC 62 12/30/2018 0806     Other studies Reviewed: Additional studies/ records that were reviewed today with results demonstrating: 2019 echo with normal LV function.   ASSESSMENT AND PLAN:  1. PAD: Managed by [facility].  Has had several procedures to the right SFA.   2. DOE/CP:  Could be anginal equivalent. Atypical chest pain noted a ttimes as well.  Given PAD and episode he had at work with exertion, he needs ischemic w/u.  We discussed CTA coronaries.   Last stress test in 2014.  Will have him take an extra 50 mg of Toprol in addition to his usual dose.  He has been taking Toprol daily. 3. Fatigue: Many potential causes including hormone therapy and , radiation and deconditioning.  4. HTN: The current medical regimen is effective;  continue present plan and medications. 5. Hyperlipidemia: LDL 61. Continue atorvastatin.   Current medicines are reviewed at length with the patient today.  The patient concerns regarding his medicines were addressed.  The following changes have been made:  No change  Labs/ tests ordered today include:   Orders Placed This Encounter  Procedures  . CT CORONARY MORPH W/CTA COR W/SCORE W/CA W/CM &/OR WO/CM  . Basic Metabolic Panel (BMET)  . EKG 12-Lead    Recommend 150 minutes/week of aerobic exercise Low fat, low carb, high fiber diet recommended  Disposition:   FU in 1 year   Signed, Larae Grooms, MD  05/29/2020 12:34 PM    [facility] [address], [place], [place]  [address] Phone: [phone]; Fax: [phone]

## 2020-05-29 ENCOUNTER — Encounter: Payer: Self-pay | Admitting: Interventional Cardiology

## 2020-05-29 ENCOUNTER — Other Ambulatory Visit: Payer: Self-pay

## 2020-05-29 ENCOUNTER — Ambulatory Visit (INDEPENDENT_AMBULATORY_CARE_PROVIDER_SITE_OTHER): Payer: Medicare Other | Admitting: Interventional Cardiology

## 2020-05-29 VITALS — BP 116/78 | HR 97 | Ht 73.0 in | Wt 232.0 lb

## 2020-05-29 DIAGNOSIS — I10 Essential (primary) hypertension: Secondary | ICD-10-CM

## 2020-05-29 DIAGNOSIS — I739 Peripheral vascular disease, unspecified: Secondary | ICD-10-CM

## 2020-05-29 DIAGNOSIS — E782 Mixed hyperlipidemia: Secondary | ICD-10-CM

## 2020-05-29 DIAGNOSIS — R5383 Other fatigue: Secondary | ICD-10-CM | POA: Diagnosis not present

## 2020-05-29 DIAGNOSIS — R0609 Other forms of dyspnea: Secondary | ICD-10-CM

## 2020-05-29 DIAGNOSIS — R072 Precordial pain: Secondary | ICD-10-CM

## 2020-05-29 DIAGNOSIS — R0600 Dyspnea, unspecified: Secondary | ICD-10-CM

## 2020-05-29 NOTE — Patient Instructions (Addendum)
Medication Instructions:  Your physician recommends that you continue on your current medications as directed. Please refer to the Current Medication list given to you today.    *If you need a refill on your cardiac medications before your next appointment, please call your pharmacy*   Lab Work: Lab work to be done today--BMP If you have labs (blood work) drawn today and your tests are completely normal, you will receive your results only by: Marland Kitchen MyChart Message (if you have MyChart) OR . A paper copy in the mail If you have any lab test that is abnormal or we need to change your treatment, we will call you to review the results.   Testing/Procedures: Your physician has requested that you have cardiac CT. Cardiac computed tomography (CT) is a painless test that uses an x-ray machine to take clear, detailed pictures of your heart. For further information please visit [email or web address]. Please follow instruction sheet as given.      Follow-Up: At [facility], you and your health needs are our priority.  As part of our continuing mission to provide you with exceptional heart care, we have created designated Provider Care Teams.  These Care Teams include your primary Cardiologist (physician) and Advanced Practice Providers (APPs -  Physician Assistants and Nurse Practitioners) who all work together to provide you with the care you need, when you need it.  We recommend signing up for the patient portal called "MyChart".  Sign up information is provided on this After Visit Summary.  MyChart is used to connect with patients for Virtual Visits (Telemedicine).  Patients are able to view lab/test results, encounter notes, upcoming appointments, etc.  Non-urgent messages can be sent to your provider as well.   To learn more about what you can do with MyChart, go to [email or web address].    Your next appointment:   Based on test results  The format for your next appointment:   In  Person  Provider:   You may see Larae Grooms, MD or one of the following Advanced Practice Providers on your designated Care Team:    Melina Copa, PA-C  Ermalinda Barrios, PA-C    Other Instructions Your cardiac CT will be scheduled at one of the below locations:   [facility] [address] [place], [place] [address] [phone]  [place] [address] [place], [place] [address] [phone]  If scheduled at [facility], please arrive at the [facility] main entrance (entrance A) of [facility] 30 minutes prior to test start time. Proceed to the [facility] Radiology Department (first floor) to check-in and test prep.  If scheduled at [facility], please arrive 15 mins early for check-in and test prep.  Please follow these instructions carefully (unless otherwise directed):    On the Night Before the Test: . Be sure to Drink plenty of water. . Do not consume any caffeinated/decaffeinated beverages or chocolate 12 hours prior to your test. . Do not take any antihistamines 12 hours prior to your test.  On the Day of the Test: . Drink plenty of water until 1 hour prior to the test. . Do not eat any food 4 hours prior to the test. . You may take your regular medications prior to the test.  . Take metoprolol (Lopressor) two hours prior to test.  Take extra half dose (50 mg of your Metoprolol Succinate) . HOLD Furosemide/Hydrochlorothiazide morning of the test. . FEMALES-  please wear underwire-free bra if available        After the Test: . Drink plenty of water. . After receiving IV contrast, you may experience a mild flushed feeling. This is normal. . On occasion, you may experience a mild rash up to 24 hours after the test. This is not dangerous. If this occurs, you can take Benadryl 25 mg and increase your fluid intake. . If you experience trouble breathing,  this can be serious. If it is severe call 911 IMMEDIATELY. If it is mild, please call our office. . If you take any of these medications: Glipizide/Metformin, Avandament, Glucavance, please do not take 48 hours after completing test unless otherwise instructed.   Once we have confirmed authorization from your insurance company, we will call you to set up a date and time for your test. Based on how quickly your insurance processes prior authorizations requests, please allow up to 4 weeks to be contacted for scheduling your Cardiac CT appointment. Be advised that routine Cardiac CT appointments could be scheduled as many as 8 weeks after your provider has ordered it.  For non-scheduling related questions, please contact the cardiac imaging nurse navigator should you have any questions/concerns: Marchia Bond, Cardiac Imaging Nurse Navigator Gordy Clement, Cardiac Imaging Nurse Navigator [facility] Direct Office Dial: [phone]   For scheduling needs, including cancellations and rescheduling, please call [place], [phone].

## 2020-05-30 LAB — BASIC METABOLIC PANEL
BUN/Creatinine Ratio: 18 (ref 10–24)
BUN: 16 mg/dL (ref 8–27)
CO2: 21 mmol/L (ref 20–29)
Calcium: 9.9 mg/dL (ref 8.6–10.2)
Chloride: 106 mmol/L (ref 96–106)
Creatinine, Ser: 0.88 mg/dL (ref 0.76–1.27)
Glucose: 136 mg/dL — ABNORMAL HIGH (ref 65–99)
Potassium: 4.2 mmol/L (ref 3.5–5.2)
Sodium: 145 mmol/L — ABNORMAL HIGH (ref 134–144)
eGFR: 92 mL/min/{1.73_m2} (ref >59)

## 2020-06-07 ENCOUNTER — Telehealth (HOSPITAL_COMMUNITY): Payer: Self-pay | Admitting: *Deleted

## 2020-06-07 ENCOUNTER — Other Ambulatory Visit (HOSPITAL_COMMUNITY): Payer: Self-pay | Admitting: *Deleted

## 2020-06-07 MED ORDER — METOPROLOL TARTRATE 100 MG PO TABS: ORAL_TABLET | ORAL | 0 refills | Status: DC

## 2020-06-07 NOTE — Telephone Encounter (Signed)
Attempted to call patient regarding upcoming cardiac CT appointment. °Left message on voicemail with name and callback number ° °Riggin Cuttino RN Navigator Cardiac Imaging °[facility] °[phone] Office °[phone] Cell ° °

## 2020-06-07 NOTE — Telephone Encounter (Signed)
Reaching out to patient to offer assistance regarding upcoming cardiac imaging study; pt verbalizes understanding of appt date/time, parking situation and where to check in, pre-test NPO status and medications ordered, and verified current allergies; name and call back number provided for further questions should they arise  Saim Almanza RN Navigator Cardiac Imaging [facility] [phone] office [phone] cell  

## 2020-06-10 ENCOUNTER — Ambulatory Visit (HOSPITAL_COMMUNITY)
Admission: RE | Admit: 2020-06-10 | Discharge: 2020-06-10 | Disposition: A | Discharge status: Home / Self Care | Payer: Medicare Other | Source: Ambulatory Visit | Attending: Interventional Cardiology | Admitting: Interventional Cardiology

## 2020-06-10 ENCOUNTER — Other Ambulatory Visit: Payer: Self-pay

## 2020-06-10 DIAGNOSIS — R072 Precordial pain: Secondary | ICD-10-CM

## 2020-06-10 MED ORDER — NITROGLYCERIN 0.4 MG SL SUBL: 0.8000 mg | SUBLINGUAL_TABLET | Freq: Once | SUBLINGUAL | Status: AC | PRN

## 2020-06-10 MED ORDER — IOHEXOL 350 MG/ML SOLN
80.0000 mL | Freq: Once | INTRAVENOUS | Status: AC | PRN
  Administered 2020-06-10: 80 mL via INTRAVENOUS

## 2020-06-10 MED ORDER — NITROGLYCERIN 0.4 MG SL SUBL
SUBLINGUAL_TABLET | SUBLINGUAL | Status: AC
  Administered 2020-06-10: 0.8 mg via SUBLINGUAL
  Filled 2020-06-10: qty 2

## 2020-06-10 NOTE — Progress Notes (Signed)
   06/10/20 1446  Vital Signs  Pulse Rate 63  Pulse Rate Source Monitor  BP (!) 98/52  MAP (mmHg) (!) 63  BP Location Left Arm  BP Method Automatic  Patient Position (if appropriate) Sitting  MEWS Score  MEWS Temp 0  MEWS Systolic 1  MEWS Pulse 0  MEWS RR 0  MEWS LOC 0  MEWS Score 1  MEWS Score Color Green    CT cardiac imaging completed. Provided with bottle of water after CT scan. PIV removed. Taken by wheelchair to Radiology Waiting Room where wife is waiting for him. Discharged to care of wife in stable condition.

## 2020-06-17 ENCOUNTER — Telehealth: Payer: Self-pay | Admitting: Interventional Cardiology

## 2020-06-17 NOTE — Telephone Encounter (Signed)
Left message to call back  

## 2020-06-17 NOTE — Telephone Encounter (Signed)
Patient calling for CT results. He states he did not receive a voicemail. He states if the call back is not from the office number, [phone], his phone will block it.

## 2020-06-18 NOTE — Telephone Encounter (Signed)
Patient called in to get CT results.  Results provided.  Pleased to hear.  Will continue healthy diet and regular exercise.

## 2020-07-10 ENCOUNTER — Ambulatory Visit: Payer: Medicare Other | Admitting: Allergy & Immunology

## 2021-01-09 ENCOUNTER — Telehealth: Payer: Self-pay

## 2021-01-09 NOTE — Telephone Encounter (Signed)
Patient calls today to report that over the last little bit he has had more cramping after walking - can walk about 50 feet before stopping to rest. His ankles are also swelling. He has had multiple interventions on the right leg last one in 2019, and this is the one that is giving him trouble. He denies any wounds or rest pain. Moved patient appt up for RLE art, ABI, and evaluation.

## 2021-01-10 ENCOUNTER — Other Ambulatory Visit: Payer: Self-pay | Admitting: *Deleted

## 2021-01-10 DIAGNOSIS — I739 Peripheral vascular disease, unspecified: Secondary | ICD-10-CM

## 2021-01-20 ENCOUNTER — Other Ambulatory Visit: Payer: Self-pay

## 2021-01-20 ENCOUNTER — Ambulatory Visit (HOSPITAL_COMMUNITY)
Admission: RE | Admit: 2021-01-20 | Discharge: 2021-01-20 | Disposition: A | Discharge status: Home / Self Care | Payer: Medicare Other | Source: Ambulatory Visit | Attending: Vascular Surgery | Admitting: Vascular Surgery

## 2021-01-20 ENCOUNTER — Ambulatory Visit (INDEPENDENT_AMBULATORY_CARE_PROVIDER_SITE_OTHER)
Admission: RE | Admit: 2021-01-20 | Discharge: 2021-01-20 | Disposition: A | Discharge status: Home / Self Care | Payer: Medicare Other | Source: Ambulatory Visit | Attending: Vascular Surgery | Admitting: Vascular Surgery

## 2021-01-20 ENCOUNTER — Ambulatory Visit (INDEPENDENT_AMBULATORY_CARE_PROVIDER_SITE_OTHER): Payer: Medicare Other | Admitting: Physician Assistant

## 2021-01-20 VITALS — BP 114/77 | HR 79 | Temp 98.0°F | Resp 20 | Ht 73.0 in | Wt 234.1 lb

## 2021-01-20 DIAGNOSIS — I739 Peripheral vascular disease, unspecified: Secondary | ICD-10-CM

## 2021-01-20 DIAGNOSIS — I70211 Atherosclerosis of native arteries of extremities with intermittent claudication, right leg: Secondary | ICD-10-CM | POA: Diagnosis not present

## 2021-01-20 NOTE — Progress Notes (Signed)
Office Note     CC:  follow up Requesting Provider:  Rolene Course, PA-C  HPI: Michael [name] is a 70 y.o. ([birth year]) male who presents for follow up of peripheral artery disease. He has history of primarily right lower extremity claudication symptoms. He has remote history of right SFA stenting by Dr. Trula Slade in September of 2012. He since has had two interventions on the RLE for in stent restenosis, once in July of 2018, and again in January of 2019.   At his last visit in May of 2021 he overall was doing well. He was not having any claudication symptoms or rest pain. He tolerated walking 1 mile a day without symptoms.   Today he returns with non invasive studies. He reports that over the past couple months he has noticed a recurrence of pain on ambulation in his right leg. He says he has been walking both during his part time job as well as when he takes his dog on walks with his wife that after about 50 yards he has to stop and rest. The cramping resolves with rest but will occur again on continued ambulation.he says this only occurs in right leg. There is radiation of the pain into his right hip as well. He denies any pain at rest or tissue loss. He has been compliant with his Plavix and Statin.   The pt is on a statin for cholesterol management.  The pt is not on a daily aspirin.   Other AC:  Plavix The pt is on ACE, CCB, BB for hypertension.   The pt is diabetic.   Tobacco hx:  Former  Past Medical History:  Diagnosis Date   Allergy    Anxiety    Diabetes mellitus    Hyperlipidemia    Hypertension    PAD (peripheral artery disease) ([place])    a. s/p prior LE stenting 2012, 03/2017 - followed by [facility].   Prostate cancer [facility]     Past Surgical History:  Procedure Laterality Date   ABDOMINAL AORTOGRAM N/A 09/01/2016   Procedure: Abdominal Aortogram;  Surgeon: Serafina Mitchell, MD;  Location: [place] [facility];  Service: Cardiovascular;  Laterality: N/A;   ABDOMINAL AORTOGRAM  W/LOWER EXTREMITY N/A 03/16/2017   Procedure: ABDOMINAL AORTOGRAM W/LOWER EXTREMITY;  Surgeon: Serafina Mitchell, MD;  Location: [place] [facility];  Service: Cardiovascular;  Laterality: N/A;   ANGIOPLASTY / STENTING FEMORAL  11/05/10   Left SFA stent   KNEE ARTHROSCOPY     LOWER EXTREMITY ANGIOGRAPHY N/A 09/01/2016   Procedure: Lower Extremity Angiography;  Surgeon: Serafina Mitchell, MD;  Location: [place] [facility];  Service: Cardiovascular;  Laterality: N/A;   PERIPHERAL VASCULAR ATHERECTOMY Right 09/01/2016   Procedure: Peripheral Vascular Atherectomy;  Surgeon: Serafina Mitchell, MD;  Location: [place] [facility];  Service: Cardiovascular;  Laterality: Right;  superficial femoral   PERIPHERAL VASCULAR INTERVENTION Right 03/16/2017   Procedure: PERIPHERAL VASCULAR INTERVENTION;  Surgeon: Serafina Mitchell, MD;  Location: [place] [facility];  Service: Cardiovascular;  Laterality: Right;  superficicial femoral   ROTATOR CUFF REPAIR     1990    Social History   Socioeconomic History   Marital status: Married    Spouse name: Not on file   Number of children: Not on file   Years of education: Not on file   Highest education level: Not on file  Occupational History   Not on file  Tobacco Use   Smoking status: Never  Smokeless tobacco: Former    Quit date: 10/26/1981  Vaping Use   Vaping Use: Never used  Substance and Sexual Activity   Alcohol use: Yes    Comment: occasional drink   Drug use: No   Sexual activity: Not on file  Other Topics Concern   Not on file  Social History Narrative   Not on file   Social Determinants of Health   Financial Resource Strain: Not on file  Food Insecurity: Not on file  Transportation Needs: Not on file  Physical Activity: Not on file  Stress: Not on file  Social Connections: Not on file  Intimate Partner Violence: Not on file    Family History  Problem Relation Age of Onset   Hyperlipidemia Mother    Hypertension Mother    Heart disease  Father    Deep vein thrombosis Father    Hyperlipidemia Father    Hypertension Father    Heart attack Father    Peripheral vascular disease Father    Cancer Sister     Current Outpatient Medications  Medication Sig Dispense Refill   ACCU-CHEK AVIVA PLUS test strip AS DIRECTED SEVEN TIMES A DAY IN VITRO 90 DAYS  4   amLODipine (NORVASC) 10 MG tablet Take 10 mg by mouth daily.     atorvastatin (LIPITOR) 40 MG tablet Take 40 mg by mouth at bedtime.  5   benazepril (LOTENSIN) 40 MG tablet Take 40 mg by mouth daily.     clonazePAM (KLONOPIN) 0.5 MG tablet TAKE HALF TABLET DAILY AS NEEDED FOR SLEEP  5   clopidogrel (PLAVIX) 75 MG tablet TAKE 1 TABLET BY MOUTH EVERY DAY WITH BREAKFAST 90 tablet 3   Continuous Blood Gluc Receiver (FREESTYLE LIBRE 14 DAY READER) DEVI use to monitor blood sugar     dapagliflozin propanediol (FARXIGA) 5 MG TABS tablet 1 tablet     famotidine (PEPCID) 20 MG tablet 1 tablet at bedtime.     fexofenadine (ALLEGRA) 180 MG tablet Take 180 mg by mouth daily.     fluticasone (FLONASE) 50 MCG/ACT nasal spray Place 2 sprays into both nostrils at bedtime as needed (before uses CPAP if needed).      folic acid (FOLVITE) 1 MG tablet 1 tablet     HUMALOG KWIKPEN 100 UNIT/ML KwikPen      hydrochlorothiazide (HYDRODIURIL) 25 MG tablet Take 1 tablet (25 mg total) by mouth daily. Please make overdue appt with Dr. Irish Lack before anymore refills. 2nd attempt 15 tablet 0   ibuprofen (ADVIL,MOTRIN) 200 MG tablet Take 800 mg by mouth daily as needed for mild pain.      insulin glargine (LANTUS) 100 UNIT/ML injection Inject 24 Units into the skin at bedtime.     JARDIANCE 25 MG TABS tablet Take 25 mg by mouth daily.     metFORMIN (GLUCOPHAGE-XR) 500 MG 24 hr tablet 4 tablets with evening meal     methotrexate 2.5 MG tablet Take 15 mg by mouth once a week.     metoprolol succinate (TOPROL-XL) 100 MG 24 hr tablet Take 100 mg by mouth daily.      metoprolol tartrate (LOPRESSOR) 100 MG  tablet Take 1 tablet (100mg ) by mouth 2 hours prior to test. 1 tablet 0   solifenacin (VESICARE) 5 MG tablet Take 5 mg by mouth daily.     tamsulosin (FLOMAX) 0.4 MG CAPS capsule TAKE 1 CAPSULE BY MOUTH EVERY DAY AFTER DINNER  5   TRULICITY 8.36 OQ/9.4TM SOPN Inject 0.75 mg into  the skin once a week.     No current facility-administered medications for this visit.    Allergies  Allergen Reactions   Demerol Anaphylaxis   Lactose Intolerance (Gi) Anaphylaxis   Penicillins Anaphylaxis and Other (See Comments)    Has patient had a PCN reaction causing immediate rash, facial/tongue/throat swelling, SOB or lightheadedness with hypotension: Yes Has patient had a PCN reaction causing severe rash involving mucus membranes or skin necrosis: No Has patient had a PCN reaction that required hospitalization: No Has patient had a PCN reaction occurring within the last 10 years: No If all of the above answers are "NO", then may proceed with Cephalosporin use.  Has patient had a PCN reaction causing immediate rash, facial/tongue/throat swelling, SOB or lightheadedness with hypotension: Yes Has patient had a PCN reaction causing severe rash involving mucus membranes or skin necrosis: No Has patient had a PCN reaction that required hospitalization: No Has patient had a PCN reaction occurring within the last 10 years: No If all of the above answers are "NO", then may proceed with Cephalosporin use. Has patient had a PCN reaction causing immediate rash, facial/tongue/throat swelling, SOB or lightheadedness with hypotension: Yes Has patient had a PCN reaction causing severe rash involving mucus membranes or skin necrosis: No Has patient had a PCN reaction that required hospitalization: No Has patient had a PCN reaction occurring within the last 10 years: No If all of the above answers are "NO", then may proceed with Cephalosporin use.   Actos [Pioglitazone] Nausea Only   Exenatide Nausea Only and Other (See  Comments)   Fentanyl Other (See Comments)   Glimepiride Other (See Comments)    Edema in ankles    Nabumetone Nausea Only and Other (See Comments)   Other    Oxycodone Nausea Only and Other (See Comments)     REVIEW OF SYSTEMS:  [X]  denotes positive finding, [ ]  denotes negative finding Cardiac  Comments:  Chest pain or chest pressure:    Shortness of breath upon exertion:    Short of breath when lying flat:    Irregular heart rhythm:        Vascular    Pain in calf, thigh, or hip brought on by ambulation:    Pain in feet at night that wakes you up from your sleep:     Blood clot in your veins:    Leg swelling:         Pulmonary    Oxygen at home:    Productive cough:     Wheezing:         Neurologic    Sudden weakness in arms or legs:     Sudden numbness in arms or legs:     Sudden onset of difficulty speaking or slurred speech:    Temporary loss of vision in one eye:     Problems with dizziness:         Gastrointestinal    Blood in stool:     Vomited blood:         Genitourinary    Burning when urinating:     Blood in urine:        Psychiatric    Major depression:         Hematologic    Bleeding problems:    Problems with blood clotting too easily:        Skin    Rashes or ulcers:        Constitutional    Fever or chills:  PHYSICAL EXAMINATION:  Vitals:   01/20/21 1047  BP: 114/77  Pulse: 79  Resp: 20  Temp: 98 F (36.7 C)  TempSrc: Temporal  SpO2: 96%  Weight: 234 lb 1.6 oz (106.2 kg)  Height: 6\' 1"  (1.854 m)    General:  WDWN in NAD; vital signs documented above Gait: Normal HENT: WNL, normocephalic Pulmonary: normal non-labored breathing , without wheezing Cardiac: regular HR, without  Murmurs without carotid bruit Abdomen: soft, NT, no masses. Large ventral hernia. Reduces spontaneously Vascular Exam/Pulses:  Right Left  Radial 2+ (normal) 2+ (normal)  Femoral 2+ (normal) 2+ (normal)  Popliteal absent absent  DP absent 2+  (normal)  PT 1+ (weak) 1+ (weak)   Extremities: without ischemic changes, without Gangrene , without cellulitis; without open wounds;  Musculoskeletal: no muscle wasting or atrophy  Neurologic: A&O X 3;  No focal weakness or paresthesias are detected Psychiatric:  The pt has Normal affect.   Non-Invasive Vascular Imaging:  01/20/21  +-------+-----------+-----------+------------+------------+  ABI/TBIToday's ABIToday's TBIPrevious ABIPrevious TBI  +-------+-----------+-----------+------------+------------+  Right  0.71       0.53       0.95        0.72          +-------+-----------+-----------+------------+------------+  Left   1.13       0.89       1.10        0.78          +-------+-----------+-----------+------------+------------+   VAS [place] Lower Extremity Arterial Duplex Summary: Right: 75-99% stenosis(PSV 454 cm/s) noted in the superficial femoral artery. Unable to delineate superficial femoral artery stent walls due to calcification. However, the arteries appear patent in their entirety with triphasic waveforms noted proximally with monophasic waveforms in the mid and distal SFA.   ASSESSMENT/PLAN:: 70 y.o. male here for follow up for peripheral artery disease.  He has history of RLE claudication since 2012. He has subsequently had 3 interventions with stenting of right SFA and repeat intervention due to recurrent stenosis. His last intervention was in January of 2019 by Dr. Trula Slade. He now has recurrence of RLE lifestyle limiting claudication. His duplex today shows increased velocities in the mid SFA from his prior duplex ( 244 to now 454 cm/s). Additionally his RLE ABI has decreased from .95 to .71. He reports at time of his last intervention he had a bad reaction to Fentanyl and Dr. Trula Slade discussed that if he needed any further interventions they would need scheduled in main OR vs. Cath lab. I will therefore arrange for him to have Aortogram, Arteriogram with  possible intervention RLE with Dr. Trula Slade in the near future.    Karoline Caldwell, PA-C [facility] [phone]  Clinic MD:   Virl Cagey

## 2021-01-20 NOTE — H&P (View-Only) (Signed)
Office Note     CC:  follow up Requesting Provider:  Rolene Course, PA-C  HPI: Michael [name] is a 70 y.o. (January 09, 1951) male who presents for follow up of peripheral artery disease. He has history of primarily right lower extremity claudication symptoms. He has remote history of right SFA stenting by Dr. Trula Slade in September of 2012. He since has had two interventions on the RLE for in stent restenosis, once in July of 2018, and again in January of 2019.   At his last visit in May of 2021 he overall was doing well. He was not having any claudication symptoms or rest pain. He tolerated walking 1 mile a day without symptoms.   Today he returns with non invasive studies. He reports that over the past couple months he has noticed a recurrence of pain on ambulation in his right leg. He says he has been walking both during his part time job as well as when he takes his dog on walks with his wife that after about 50 yards he has to stop and rest. The cramping resolves with rest but will occur again on continued ambulation.he says this only occurs in right leg. There is radiation of the pain into his right hip as well. He denies any pain at rest or tissue loss. He has been compliant with his Plavix and Statin.   The pt is on a statin for cholesterol management.  The pt is not on a daily aspirin.   Other AC:  Plavix The pt is on ACE, CCB, BB for hypertension.   The pt is diabetic.   Tobacco hx:  Former  Past Medical History:  Diagnosis Date   Allergy    Anxiety    Diabetes mellitus    Hyperlipidemia    Hypertension    PAD (peripheral artery disease) ([place])    a. s/p prior LE stenting 2012, 03/2017 - followed by [facility].   Prostate cancer [facility])     Past Surgical History:  Procedure Laterality Date   ABDOMINAL AORTOGRAM N/A 09/01/2016   Procedure: Abdominal Aortogram;  Surgeon: Serafina Mitchell, MD;  Location: [place] [facility];  Service: Cardiovascular;  Laterality: N/A;   ABDOMINAL AORTOGRAM  W/LOWER EXTREMITY N/A 03/16/2017   Procedure: ABDOMINAL AORTOGRAM W/LOWER EXTREMITY;  Surgeon: Serafina Mitchell, MD;  Location: [place] [facility];  Service: Cardiovascular;  Laterality: N/A;   ANGIOPLASTY / STENTING FEMORAL  11/05/10   Left SFA stent   KNEE ARTHROSCOPY     LOWER EXTREMITY ANGIOGRAPHY N/A 09/01/2016   Procedure: Lower Extremity Angiography;  Surgeon: Serafina Mitchell, MD;  Location: [place] [facility];  Service: Cardiovascular;  Laterality: N/A;   PERIPHERAL VASCULAR ATHERECTOMY Right 09/01/2016   Procedure: Peripheral Vascular Atherectomy;  Surgeon: Serafina Mitchell, MD;  Location: [place] [facility];  Service: Cardiovascular;  Laterality: Right;  superficial femoral   PERIPHERAL VASCULAR INTERVENTION Right 03/16/2017   Procedure: PERIPHERAL VASCULAR INTERVENTION;  Surgeon: Serafina Mitchell, MD;  Location: [place] [facility];  Service: Cardiovascular;  Laterality: Right;  superficicial femoral   ROTATOR CUFF REPAIR     1990    Social History   Socioeconomic History   Marital status: Married    Spouse name: Not on file   Number of children: Not on file   Years of education: Not on file   Highest education level: Not on file  Occupational History   Not on file  Tobacco Use   Smoking status: Never  Smokeless tobacco: Former    Quit date: 10/26/1981  Vaping Use   Vaping Use: Never used  Substance and Sexual Activity   Alcohol use: Yes    Comment: occasional drink   Drug use: No   Sexual activity: Not on file  Other Topics Concern   Not on file  Social History Narrative   Not on file   Social Determinants of Health   Financial Resource Strain: Not on file  Food Insecurity: Not on file  Transportation Needs: Not on file  Physical Activity: Not on file  Stress: Not on file  Social Connections: Not on file  Intimate Partner Violence: Not on file    Family History  Problem Relation Age of Onset   Hyperlipidemia Mother    Hypertension Mother    Heart disease  Father    Deep vein thrombosis Father    Hyperlipidemia Father    Hypertension Father    Heart attack Father    Peripheral vascular disease Father    Cancer Sister     Current Outpatient Medications  Medication Sig Dispense Refill   ACCU-CHEK AVIVA PLUS test strip AS DIRECTED SEVEN TIMES A DAY IN VITRO 90 DAYS  4   amLODipine (NORVASC) 10 MG tablet Take 10 mg by mouth daily.     atorvastatin (LIPITOR) 40 MG tablet Take 40 mg by mouth at bedtime.  5   benazepril (LOTENSIN) 40 MG tablet Take 40 mg by mouth daily.     clonazePAM (KLONOPIN) 0.5 MG tablet TAKE HALF TABLET DAILY AS NEEDED FOR SLEEP  5   clopidogrel (PLAVIX) 75 MG tablet TAKE 1 TABLET BY MOUTH EVERY DAY WITH BREAKFAST 90 tablet 3   Continuous Blood Gluc Receiver (FREESTYLE LIBRE 14 DAY READER) DEVI use to monitor blood sugar     dapagliflozin propanediol (FARXIGA) 5 MG TABS tablet 1 tablet     famotidine (PEPCID) 20 MG tablet 1 tablet at bedtime.     fexofenadine (ALLEGRA) 180 MG tablet Take 180 mg by mouth daily.     fluticasone (FLONASE) 50 MCG/ACT nasal spray Place 2 sprays into both nostrils at bedtime as needed (before uses CPAP if needed).      folic acid (FOLVITE) 1 MG tablet 1 tablet     HUMALOG KWIKPEN 100 UNIT/ML KwikPen      hydrochlorothiazide (HYDRODIURIL) 25 MG tablet Take 1 tablet (25 mg total) by mouth daily. Please make overdue appt with Dr. Irish Lack before anymore refills. 2nd attempt 15 tablet 0   ibuprofen (ADVIL,MOTRIN) 200 MG tablet Take 800 mg by mouth daily as needed for mild pain.      insulin glargine (LANTUS) 100 UNIT/ML injection Inject 24 Units into the skin at bedtime.     JARDIANCE 25 MG TABS tablet Take 25 mg by mouth daily.     metFORMIN (GLUCOPHAGE-XR) 500 MG 24 hr tablet 4 tablets with evening meal     methotrexate 2.5 MG tablet Take 15 mg by mouth once a week.     metoprolol succinate (TOPROL-XL) 100 MG 24 hr tablet Take 100 mg by mouth daily.      metoprolol tartrate (LOPRESSOR) 100 MG  tablet Take 1 tablet (100mg ) by mouth 2 hours prior to test. 1 tablet 0   solifenacin (VESICARE) 5 MG tablet Take 5 mg by mouth daily.     tamsulosin (FLOMAX) 0.4 MG CAPS capsule TAKE 1 CAPSULE BY MOUTH EVERY DAY AFTER DINNER  5   TRULICITY 6.71 IW/5.8KD SOPN Inject 0.75 mg into  the skin once a week.     No current facility-administered medications for this visit.    Allergies  Allergen Reactions   Demerol Anaphylaxis   Lactose Intolerance (Gi) Anaphylaxis   Penicillins Anaphylaxis and Other (See Comments)    Has patient had a PCN reaction causing immediate rash, facial/tongue/throat swelling, SOB or lightheadedness with hypotension: Yes Has patient had a PCN reaction causing severe rash involving mucus membranes or skin necrosis: No Has patient had a PCN reaction that required hospitalization: No Has patient had a PCN reaction occurring within the last 10 years: No If all of the above answers are "NO", then may proceed with Cephalosporin use.  Has patient had a PCN reaction causing immediate rash, facial/tongue/throat swelling, SOB or lightheadedness with hypotension: Yes Has patient had a PCN reaction causing severe rash involving mucus membranes or skin necrosis: No Has patient had a PCN reaction that required hospitalization: No Has patient had a PCN reaction occurring within the last 10 years: No If all of the above answers are "NO", then may proceed with Cephalosporin use. Has patient had a PCN reaction causing immediate rash, facial/tongue/throat swelling, SOB or lightheadedness with hypotension: Yes Has patient had a PCN reaction causing severe rash involving mucus membranes or skin necrosis: No Has patient had a PCN reaction that required hospitalization: No Has patient had a PCN reaction occurring within the last 10 years: No If all of the above answers are "NO", then may proceed with Cephalosporin use.   Actos [Pioglitazone] Nausea Only   Exenatide Nausea Only and Other (See  Comments)   Fentanyl Other (See Comments)   Glimepiride Other (See Comments)    Edema in ankles    Nabumetone Nausea Only and Other (See Comments)   Other    Oxycodone Nausea Only and Other (See Comments)     REVIEW OF SYSTEMS:  [X]  denotes positive finding, [ ]  denotes negative finding Cardiac  Comments:  Chest pain or chest pressure:    Shortness of breath upon exertion:    Short of breath when lying flat:    Irregular heart rhythm:        Vascular    Pain in calf, thigh, or hip brought on by ambulation:    Pain in feet at night that wakes you up from your sleep:     Blood clot in your veins:    Leg swelling:         Pulmonary    Oxygen at home:    Productive cough:     Wheezing:         Neurologic    Sudden weakness in arms or legs:     Sudden numbness in arms or legs:     Sudden onset of difficulty speaking or slurred speech:    Temporary loss of vision in one eye:     Problems with dizziness:         Gastrointestinal    Blood in stool:     Vomited blood:         Genitourinary    Burning when urinating:     Blood in urine:        Psychiatric    Major depression:         Hematologic    Bleeding problems:    Problems with blood clotting too easily:        Skin    Rashes or ulcers:        Constitutional    Fever or chills:  PHYSICAL EXAMINATION:  Vitals:   01/20/21 1047  BP: 114/77  Pulse: 79  Resp: 20  Temp: 98 F (36.7 C)  TempSrc: Temporal  SpO2: 96%  Weight: 234 lb 1.6 oz (106.2 kg)  Height: 6\' 1"  (1.854 m)    General:  WDWN in NAD; vital signs documented above Gait: Normal HENT: WNL, normocephalic Pulmonary: normal non-labored breathing , without wheezing Cardiac: regular HR, without  Murmurs without carotid bruit Abdomen: soft, NT, no masses. Large ventral hernia. Reduces spontaneously Vascular Exam/Pulses:  Right Left  Radial 2+ (normal) 2+ (normal)  Femoral 2+ (normal) 2+ (normal)  Popliteal absent absent  DP absent 2+  (normal)  PT 1+ (weak) 1+ (weak)   Extremities: without ischemic changes, without Gangrene , without cellulitis; without open wounds;  Musculoskeletal: no muscle wasting or atrophy  Neurologic: A&O X 3;  No focal weakness or paresthesias are detected Psychiatric:  The pt has Normal affect.   Non-Invasive Vascular Imaging:  01/20/21  +-------+-----------+-----------+------------+------------+  ABI/TBIToday's ABIToday's TBIPrevious ABIPrevious TBI  +-------+-----------+-----------+------------+------------+  Right  0.71       0.53       0.95        0.72          +-------+-----------+-----------+------------+------------+  Left   1.13       0.89       1.10        0.78          +-------+-----------+-----------+------------+------------+   VAS [place] Lower Extremity Arterial Duplex Summary: Right: 75-99% stenosis(PSV 454 cm/s) noted in the superficial femoral artery. Unable to delineate superficial femoral artery stent walls due to calcification. However, the arteries appear patent in their entirety with triphasic waveforms noted proximally with monophasic waveforms in the mid and distal SFA.   ASSESSMENT/PLAN:: 70 y.o. male here for follow up for peripheral artery disease.  He has history of RLE claudication since 2012. He has subsequently had 3 interventions with stenting of right SFA and repeat intervention due to recurrent stenosis. His last intervention was in January of 2019 by Dr. Trula Slade. He now has recurrence of RLE lifestyle limiting claudication. His duplex today shows increased velocities in the mid SFA from his prior duplex ( 244 to now 454 cm/s). Additionally his RLE ABI has decreased from .95 to .71. He reports at time of his last intervention he had a bad reaction to Fentanyl and Dr. Trula Slade discussed that if he needed any further interventions they would need scheduled in main OR vs. Cath lab. I will therefore arrange for him to have Aortogram, Arteriogram with  possible intervention RLE with Dr. Trula Slade in the near future.    Karoline Caldwell, PA-C [facility] [phone]  Clinic MD:   Virl Cagey

## 2021-01-28 NOTE — Pre-Procedure Instructions (Signed)
Surgical Instructions     Your procedure is scheduled on Thursday, December 1st .  Report to [facility] Main Entrance "A" at 7:30 A.M., then check in with the Admitting office.  Call this number if you have problems the morning of surgery:  [phone]   If you have any questions prior to your surgery date call [phone]: Open Monday-Friday 8am-4pm    Remember:  Do not eat or drink after midnight the night before your surgery  Take these medicines the morning of surgery with A SIP OF WATER   amLODipine (NORVASC)  famotidine (PEPCID)  metoprolol succinate (TOPROL-XL)  fluticasone (FLONASE)-as needed   Follow your surgeon's instructions on when to stop/resume clopidogrel (PLAVIX).  If no instructions were given by your surgeon then you will need to call the office to get those instructions.    As of today, STOP taking any Aspirin (unless otherwise instructed by your surgeon) Aleve, Naproxen, Ibuprofen, Motrin, Advil, Goody's, BC's, all herbal medications, fish oil, and all vitamins. This includes: diclofenac Sodium (VOLTAREN) 1 % GEL.   WHAT DO I DO ABOUT MY DIABETES MEDICATION?   Do not take metFORMIN (GLUCOPHAGE-XR) the morning of surgery.  Do not take dapagliflozin propanediol (FARXIGA) on Wednesday, (11/30) or Thursday, (12/1).  THE NIGHT BEFORE SURGERY, take 7 units of insulin glargine (LANTUS) insulin.      The day of surgery, do not take other diabetes injectables, Trulicity (dulaglutide).  If your CBG is greater than 220 mg/dL, you may take  of your sliding scale (correction) dose of HUMALOG KWIKPEN 100 UNIT/ML KwikPen insulin.   HOW TO MANAGE YOUR DIABETES BEFORE AND AFTER SURGERY  Why is it important to control my blood sugar before and after surgery? Improving blood sugar levels before and after surgery helps healing and can limit problems. A way of improving blood sugar control is eating a healthy diet by:  Eating less sugar and carbohydrates  Increasing  activity/exercise  Talking with your doctor about reaching your blood sugar goals High blood sugars (greater than 180 mg/dL) can raise your risk of infections and slow your recovery, so you will need to focus on controlling your diabetes during the weeks before surgery. Make sure that the doctor who takes care of your diabetes knows about your planned surgery including the date and location.  How do I manage my blood sugar before surgery? Check your blood sugar at least 4 times a day, starting 2 days before surgery, to make sure that the level is not too high or low.  Check your blood sugar the morning of your surgery when you wake up and every 2 hours until you get to the Short Stay unit.  If your blood sugar is less than 70 mg/dL, you will need to treat for low blood sugar: Do not take insulin. Treat a low blood sugar (less than 70 mg/dL) with  cup of clear juice (cranberry or apple), 4 glucose tablets, OR glucose gel. Recheck blood sugar in 15 minutes after treatment (to make sure it is greater than 70 mg/dL). If your blood sugar is not greater than 70 mg/dL on recheck, call [phone] for further instructions. Report your blood sugar to the short stay nurse when you get to Short Stay.  If you are admitted to the hospital after surgery: Your blood sugar will be checked by the staff and you will probably be given insulin after surgery (instead of oral diabetes medicines) to make sure you have good blood sugar levels. The goal  for blood sugar control after surgery is 80-180 mg/dL.   After your COVID test   You are not required to quarantine however you are required to wear a well-fitting mask when you are out and around people not in your household.  If your mask becomes wet or soiled, replace with a new one.  Wash your hands often with soap and water for 20 seconds or clean your hands with an alcohol-based hand sanitizer that contains at least 60% alcohol.  Do not share personal  items.  Notify your provider: if you are in close contact with someone who has COVID  or if you develop a fever of 100.4 or greater, sneezing, cough, sore throat, shortness of breath or body aches.             Do not wear jewelry. Do not wear lotions, powders, colognes, or deodorant. Men may shave face and neck. Do not bring valuables to the hospital.            [facility] is not responsible for any belongings or valuables.  Do NOT Smoke (Tobacco/Vaping)  24 hours prior to your procedure  If you use a CPAP at night, you may bring your mask for your overnight stay.   Contacts, glasses, hearing aids, dentures or partials may not be worn into surgery, please bring cases for these belongings   For patients admitted to the hospital, discharge time will be determined by your treatment team.   Patients discharged the day of surgery will not be allowed to drive home, and someone needs to stay with them for 24 hours.  NO VISITORS WILL BE ALLOWED IN PRE-OP WHERE PATIENTS ARE PREPPED FOR SURGERY.  ONLY 1 SUPPORT PERSON MAY BE PRESENT IN THE WAITING ROOM WHILE YOU ARE IN SURGERY.  IF YOU ARE TO BE ADMITTED, ONCE YOU ARE IN YOUR ROOM YOU WILL BE ALLOWED TWO (2) VISITORS. 1 (ONE) VISITOR MAY STAY OVERNIGHT BUT MUST ARRIVE TO THE ROOM BY 8pm.  Minor children may have two parents present. Special consideration for safety and communication needs will be reviewed on a case by case basis.  Special instructions:    Oral Hygiene is also important to reduce your risk of infection.  Remember - BRUSH YOUR TEETH THE MORNING OF SURGERY WITH YOUR REGULAR TOOTHPASTE   [facility]- Preparing For Surgery  Before surgery, you can play an important role. Because skin is not sterile, your skin needs to be as free of germs as possible. You can reduce the number of germs on your skin by washing with CHG (chlorahexidine gluconate) Soap before surgery.  CHG is an antiseptic cleaner which kills germs and bonds with the  skin to continue killing germs even after washing.     Please do not use if you have an allergy to CHG or antibacterial soaps. If your skin becomes reddened/irritated stop using the CHG.  Do not shave (including legs and underarms) for at least 48 hours prior to first CHG shower. It is OK to shave your face.  Please follow these instructions carefully.     Shower the NIGHT BEFORE SURGERY and the MORNING OF SURGERY with CHG Soap.   If you chose to wash your hair, wash your hair first as usual with your normal shampoo. After you shampoo, rinse your hair and body thoroughly to remove the shampoo.  Then [employer] and genitals (private parts) with your normal soap and rinse thoroughly to remove soap.  After that Use CHG Soap  as you would any other liquid soap. You can apply CHG directly to the skin and wash gently with a scrungie or a clean washcloth.   Apply the CHG Soap to your body ONLY FROM THE NECK DOWN.  Do not use on open wounds or open sores. Avoid contact with your eyes, ears, mouth and genitals (private parts). Wash Face and genitals (private parts)  with your normal soap.   Wash thoroughly, paying special attention to the area where your surgery will be performed.  Thoroughly rinse your body with warm water from the neck down.  DO NOT shower/wash with your normal soap after using and rinsing off the CHG Soap.  Pat yourself dry with a CLEAN TOWEL.  Wear CLEAN PAJAMAS to bed the night before surgery  Place CLEAN SHEETS on your bed the night before your surgery  DO NOT SLEEP WITH PETS.   Day of Surgery:  Take a shower with CHG soap. Wear Clean/Comfortable clothing the morning of surgery Do not apply any deodorants/lotions.   Remember to brush your teeth WITH YOUR REGULAR TOOTHPASTE.   Please read over the following fact sheets that you were given.

## 2021-01-29 ENCOUNTER — Encounter (HOSPITAL_COMMUNITY)
Admission: RE | Admit: 2021-01-29 | Discharge: 2021-01-29 | Disposition: A | Discharge status: Home / Self Care | Payer: Medicare Other | Source: Ambulatory Visit | Attending: Surgery | Admitting: Surgery

## 2021-01-29 ENCOUNTER — Other Ambulatory Visit: Payer: Self-pay

## 2021-01-29 ENCOUNTER — Encounter (HOSPITAL_COMMUNITY): Payer: Self-pay

## 2021-01-29 VITALS — BP 152/76 | HR 84 | Temp 98.2°F | Resp 19 | Ht 72.0 in | Wt 237.0 lb

## 2021-01-29 DIAGNOSIS — I10 Essential (primary) hypertension: Secondary | ICD-10-CM | POA: Diagnosis not present

## 2021-01-29 DIAGNOSIS — I2510 Atherosclerotic heart disease of native coronary artery without angina pectoris: Secondary | ICD-10-CM | POA: Diagnosis not present

## 2021-01-29 DIAGNOSIS — E119 Type 2 diabetes mellitus without complications: Secondary | ICD-10-CM

## 2021-01-29 DIAGNOSIS — Z01818 Encounter for other preprocedural examination: Secondary | ICD-10-CM

## 2021-01-29 DIAGNOSIS — Z01812 Encounter for preprocedural laboratory examination: Secondary | ICD-10-CM | POA: Insufficient documentation

## 2021-01-29 DIAGNOSIS — I739 Peripheral vascular disease, unspecified: Secondary | ICD-10-CM | POA: Diagnosis not present

## 2021-01-29 DIAGNOSIS — E785 Hyperlipidemia, unspecified: Secondary | ICD-10-CM | POA: Insufficient documentation

## 2021-01-29 LAB — CBC
HCT: 43 % (ref 39.0–52.0)
Hemoglobin: 13.9 g/dL (ref 13.0–17.0)
MCH: 33.9 pg (ref 26.0–34.0)
MCHC: 32.3 g/dL (ref 30.0–36.0)
MCV: 104.9 fL — ABNORMAL HIGH (ref 80.0–100.0)
Platelets: 200 10*3/uL (ref 150–400)
RBC: 4.1 MIL/uL — ABNORMAL LOW (ref 4.22–5.81)
RDW: 13.7 % (ref 11.5–15.5)
WBC: 5 10*3/uL (ref 4.0–10.5)
nRBC: 0 % (ref 0.0–0.2)

## 2021-01-29 LAB — BASIC METABOLIC PANEL
Anion gap: 8 (ref 5–15)
BUN: 20 mg/dL (ref 8–23)
CO2: 26 mmol/L (ref 22–32)
Calcium: 9.2 mg/dL (ref 8.9–10.3)
Chloride: 104 mmol/L (ref 98–111)
Creatinine, Ser: 1.01 mg/dL (ref 0.61–1.24)
GFR, Estimated: >60 mL/min (ref >60)
Glucose, Bld: 282 mg/dL — ABNORMAL HIGH (ref 70–99)
Potassium: 5.6 mmol/L — ABNORMAL HIGH (ref 3.5–5.1)
Sodium: 138 mmol/L (ref 135–145)

## 2021-01-29 LAB — HEMOGLOBIN A1C
Hgb A1c MFr Bld: 8.6 % — ABNORMAL HIGH (ref 4.8–5.6)
Mean Plasma Glucose: 200 mg/dL

## 2021-01-29 LAB — SURGICAL PCR SCREEN
MRSA, PCR: NEGATIVE
Staphylococcus aureus: NEGATIVE

## 2021-01-29 LAB — GLUCOSE, CAPILLARY: Glucose-Capillary: 245 mg/dL — ABNORMAL HIGH (ref 70–99)

## 2021-01-29 NOTE — Progress Notes (Signed)
Anesthesia Chart Review:  Follows cardiology for history of HTN, HLD, PAD (primarily managed by [facility]).  Echo 2019 showed EF 65 to 70%, normal wall motion, grade 1 DD, no significant valvular normalities.  Last seen by Dr. Irish Lack 05/29/2020 and at that time described some atypical chest pain.  Coronary CTA was ordered.  Scan showed mild nonobstructive CAD.  Recommendation was to consider nonatherosclerotic causes of chest pain.  Hx of PAD s/p multiple interventions in the RLE. Remote history of right SFA stenting by Dr. Trula Slade in September of 2012. He since has had two interventions on the RLE for in stent restenosis, once in July of 2018, and again in January of 2019. He now has recurrence of RLE lifestyle limiting claudication. Per last note from [employer], PA-C, "He reports at time of his last intervention he had a bad reaction to Fentanyl and Dr. Trula Slade discussed that if he needed any further interventions they would need scheduled in main OR vs. Cath lab." Per Dr. Stephens Shire Op note from 03/16/2017, "patient does not do well with sedation because of shoulder pain.  His procedure in the hybrid operating room under general anesthesia."  OSA with inconsistent use of CPAP.   IDDM2.  Preop labs reviewed, glucose elevated 282, otherwise unremarkable. A1c pending.   EKG 05/29/20: Sinus rhythm. Rate 97.   Coronary CTA 06/10/2020: IMPRESSION: 1. Coronary calcium score of 318. This was 63rd percentile for age and sex matched controls.   2. Normal coronary origin with right dominance.   3. Mild CAD (25-49%) in the mid LAD and mid LCX.   4. Minimal CAD (<25%) in the RCA).   RECOMMENDATIONS: 1. Mild non-obstructive CAD (25-49%). Consider non-atherosclerotic causes of chest pain. Consider preventive therapy and risk factor modification.  TTE 11/18/17: - Left ventricle: The cavity size was normal. There was moderate    concentric hypertrophy. Systolic function was vigorous. The    estimated  ejection fraction was in the range of 65% to 70%. Wall    motion was normal; there were no regional wall motion    abnormalities. Doppler parameters are consistent with abnormal    left ventricular relaxation (grade 1 diastolic dysfunction).    There was no evidence of elevated ventricular filling pressure by    Doppler parameters.  - Aortic root: The aortic root was normal in size.  - Left atrium: The atrium was normal in size.  - Right ventricle: Systolic function was normal.  - Right atrium: The atrium was normal in size.  - Tricuspid valve: There was no regurgitation.  - Inferior vena cava: The vessel was normal in size.  - Pericardium, extracardiac: There was no pericardial effusion.     Wynonia Musty [facility] Short Stay Center/Anesthesiology Phone [phone] 01/29/2021 1:26 PM

## 2021-01-29 NOTE — Anesthesia Preprocedure Evaluation (Addendum)
Anesthesia Evaluation  Patient identified by MRN, date of birth, ID band Patient awake    Reviewed: Allergy & Precautions, NPO status , Patient's Chart, lab work & pertinent test results, reviewed documented beta blocker date and time   Airway Mallampati: II  TM Distance: >3 FB Neck ROM: Full    Dental  (+) Teeth Intact, Dental Advisory Given, Caps, Missing,    Pulmonary sleep apnea and Continuous Positive Airway Pressure Ventilation ,    Pulmonary exam normal breath sounds clear to auscultation       Cardiovascular hypertension, Pt. on medications and Pt. on home beta blockers (-) angina+ Peripheral Vascular Disease  (-) Past MI Normal cardiovascular exam Rhythm:Regular Rate:Normal     Neuro/Psych PSYCHIATRIC DISORDERS Anxiety negative neurological ROS     GI/Hepatic Neg liver ROS, GERD  Medicated,  Endo/Other  diabetes, Type 2, Insulin Dependent, Oral Hypoglycemic AgentsObesity   Renal/GU negative Renal ROS   Prostate cancer     Musculoskeletal negative musculoskeletal ROS (+)   Abdominal   Peds  Hematology  (+) Blood dyscrasia (Plavix), ,   Anesthesia Other Findings Day of surgery medications reviewed with the patient.  Reproductive/Obstetrics                           Anesthesia Physical Anesthesia Plan  ASA: 3  Anesthesia Plan: General   Post-op Pain Management:    Induction: Intravenous  PONV Risk Score and Plan: 2 and Dexamethasone and Ondansetron  Airway Management Planned: LMA  Additional Equipment:   Intra-op Plan:   Post-operative Plan: Extubation in OR  Informed Consent: I have reviewed the patients History and Physical, chart, labs and discussed the procedure including the risks, benefits and alternatives for the proposed anesthesia with the patient or authorized representative who has indicated his/her understanding and acceptance.     Dental advisory  given  Plan Discussed with: CRNA  Anesthesia Plan Comments: (PAT note by Karoline Caldwell, PA-C: [facility] for history of HTN, HLD, PAD (primarily managed by [facility]).  Echo 2019 showed EF 65 to 70%, normal wall motion, grade 1 DD, no significant valvular normalities.  Last seen by Dr. Irish Lack 05/29/2020 and at that time described some atypical chest pain.  Coronary CTA was ordered.  Scan showed mild nonobstructive CAD.  Recommendation was to consider nonatherosclerotic causes of chest pain.  Hx of PAD s/p multiple interventions in the RLE. Remote history of right SFA stenting by Dr. Trula Slade in September of 2012. He since has had two interventions on the RLE for in stent restenosis, once in July of 2018, and again in January of 2019. He now has recurrence of RLE lifestyle limiting claudication. Per last note from [employer], PA-C, "He reports at time of his last intervention he had a bad reaction to Fentanyl and Dr. Trula Slade discussed that if he needed any further interventions they would need scheduled in main OR vs. Cath lab." Per Dr. Stephens Shire Op note from 03/16/2017, "patient does not do well with sedation because of shoulder pain. His procedure in the hybrid operating room under general anesthesia."  OSA with inconsistent use of CPAP.   IDDM2.  Preop labs reviewed, glucose elevated 282, otherwise unremarkable. A1c pending.   EKG 05/29/20: Sinus rhythm. Rate 97.   Coronary CTA 06/10/2020: IMPRESSION: 1. Coronary calcium score of 318. This was 63rd percentile for age and sex matched controls.  2. Normal coronary origin with right dominance.  3. Mild CAD (25-49%) in  the mid LAD and mid LCX.  4. Minimal CAD (<25%) in the RCA).  RECOMMENDATIONS: 1. Mild non-obstructive CAD (25-49%). Consider non-atherosclerotic causes of chest pain. Consider preventive therapy and risk factor modification.  TTE 11/18/17: - Left ventricle: The cavity size was normal. There was moderate   concentric hypertrophy. Systolic function was vigorous. The  estimated ejection fraction was in the range of 65% to 70%. Wall  motion was normal; there were no regional wall motion  abnormalities. Doppler parameters are consistent with abnormal  left ventricular relaxation (grade 1 diastolic dysfunction).  There was no evidence of elevated ventricular filling pressure by  Doppler parameters.  - Aortic root: The aortic root was normal in size.  - Left atrium: The atrium was normal in size.  - Right ventricle: Systolic function was normal.  - Right atrium: The atrium was normal in size.  - Tricuspid valve: There was no regurgitation.  - Inferior vena cava: The vessel was normal in size.  - Pericardium, extracardiac: There was no pericardial effusion.   )       Anesthesia Quick Evaluation

## 2021-01-29 NOTE — Progress Notes (Signed)
PCP - Dr. Orpah Melter- [facility] Cardiologist - Dr. Larae Grooms- no clearance noted  PPM/ICD - denies   Chest x-ray - denies EKG - 05/29/20 Stress Test - 01/27/2013 ECHO - 11/18/17 Cardiac Cath - denies  Sleep Study - 5-6 years ago, per pt, diagnosed with OSA CPAP - wears sometimes  DM- Type 2 Fasting Blood Sugar - 120-130 Pt has continuous blood glucose monitor  Blood Thinner Instructions: pt instructed to ask surgeon's office about Plavix, if no response take in AM Aspirin Instructions: n/a  ERAS Protcol - NPO   COVID TEST- n/a, ambulatory surgery   Anesthesia review: yes, cardiac hx  Patient denies shortness of breath, fever, cough and chest pain at PAT appointment   All instructions explained to the patient, with a verbal understanding of the material. Patient agrees to go over the instructions while at home for a better understanding. Patient also instructed to wear mask in public for 3 days prior to surgery. The opportunity to ask questions was provided.

## 2021-01-30 ENCOUNTER — Encounter (HOSPITAL_COMMUNITY): Payer: Self-pay | Admitting: Surgery

## 2021-01-30 ENCOUNTER — Ambulatory Visit (HOSPITAL_COMMUNITY)
Admission: RE | Admit: 2021-01-30 | Discharge: 2021-01-30 | Disposition: A | Discharge status: Home / Self Care | Payer: Medicare Other | Source: Ambulatory Visit | Attending: Surgery | Admitting: Surgery

## 2021-01-30 ENCOUNTER — Encounter (HOSPITAL_COMMUNITY)
Admission: RE | Disposition: A | Discharge status: Home / Self Care | Payer: Self-pay | Source: Ambulatory Visit | Attending: Surgery

## 2021-01-30 ENCOUNTER — Ambulatory Visit (HOSPITAL_COMMUNITY): Payer: Medicare Other

## 2021-01-30 ENCOUNTER — Other Ambulatory Visit: Payer: Self-pay

## 2021-01-30 ENCOUNTER — Ambulatory Visit (HOSPITAL_COMMUNITY): Payer: Medicare Other | Admitting: Physician Assistant

## 2021-01-30 ENCOUNTER — Ambulatory Visit (HOSPITAL_COMMUNITY): Payer: Medicare Other | Admitting: Anesthesiology

## 2021-01-30 DIAGNOSIS — Y839 Surgical procedure, unspecified as the cause of abnormal reaction of the patient, or of later complication, without mention of misadventure at the time of the procedure: Secondary | ICD-10-CM | POA: Insufficient documentation

## 2021-01-30 DIAGNOSIS — Z87891 Personal history of nicotine dependence: Secondary | ICD-10-CM | POA: Diagnosis not present

## 2021-01-30 DIAGNOSIS — E1151 Type 2 diabetes mellitus with diabetic peripheral angiopathy without gangrene: Secondary | ICD-10-CM | POA: Diagnosis not present

## 2021-01-30 DIAGNOSIS — Z79899 Other long term (current) drug therapy: Secondary | ICD-10-CM | POA: Diagnosis not present

## 2021-01-30 DIAGNOSIS — I70211 Atherosclerosis of native arteries of extremities with intermittent claudication, right leg: Secondary | ICD-10-CM | POA: Insufficient documentation

## 2021-01-30 DIAGNOSIS — Z7902 Long term (current) use of antithrombotics/antiplatelets: Secondary | ICD-10-CM | POA: Diagnosis not present

## 2021-01-30 DIAGNOSIS — I10 Essential (primary) hypertension: Secondary | ICD-10-CM | POA: Diagnosis not present

## 2021-01-30 DIAGNOSIS — T82858A Stenosis of vascular prosthetic devices, implants and grafts, initial encounter: Secondary | ICD-10-CM | POA: Insufficient documentation

## 2021-01-30 DIAGNOSIS — T82856A Stenosis of peripheral vascular stent, initial encounter: Secondary | ICD-10-CM | POA: Diagnosis not present

## 2021-01-30 HISTORY — PX: ULTRASOUND GUIDANCE FOR VASCULAR ACCESS: SHX6516

## 2021-01-30 HISTORY — PX: ANGIOPLASTY: SHX39

## 2021-01-30 HISTORY — PX: LOWER EXTREMITY ANGIOGRAM: SHX5508

## 2021-01-30 LAB — POCT I-STAT, CHEM 8
BUN: 25 mg/dL — ABNORMAL HIGH (ref 8–23)
Calcium, Ion: 1.15 mmol/L (ref 1.15–1.40)
Chloride: 106 mmol/L (ref 98–111)
Creatinine, Ser: 0.8 mg/dL (ref 0.61–1.24)
Glucose, Bld: 159 mg/dL — ABNORMAL HIGH (ref 70–99)
HCT: 43 % (ref 39.0–52.0)
Hemoglobin: 14.6 g/dL (ref 13.0–17.0)
Potassium: 3.9 mmol/L (ref 3.5–5.1)
Sodium: 142 mmol/L (ref 135–145)
TCO2: 27 mmol/L (ref 22–32)

## 2021-01-30 LAB — POCT ACTIVATED CLOTTING TIME: Activated Clotting Time: 263 seconds

## 2021-01-30 LAB — GLUCOSE, CAPILLARY
Glucose-Capillary: 215 mg/dL — ABNORMAL HIGH (ref 70–99)
Glucose-Capillary: 139 mg/dL — ABNORMAL HIGH (ref 70–99)

## 2021-01-30 SURGERY — ANGIOGRAM, LOWER EXTREMITY
Anesthesia: General | Site: Leg Upper | Laterality: Right

## 2021-01-30 MED ORDER — ORAL CARE MOUTH RINSE: 15.0000 mL | Freq: Once | OROMUCOSAL | Status: AC

## 2021-01-30 MED ORDER — IODIXANOL 320 MG/ML IV SOLN
INTRAVENOUS | Status: DC | PRN
  Administered 2021-01-30: 91 mL via INTRA_ARTERIAL

## 2021-01-30 MED ORDER — ACETAMINOPHEN 500 MG PO TABS
1000.0000 mg | ORAL_TABLET | Freq: Once | ORAL | Status: AC
  Administered 2021-01-30: 1000 mg via ORAL
  Filled 2021-01-30: qty 2

## 2021-01-30 MED ORDER — PHENYLEPHRINE 40 MCG/ML (10ML) SYRINGE FOR IV PUSH (FOR BLOOD PRESSURE SUPPORT)
PREFILLED_SYRINGE | INTRAVENOUS | Status: DC | PRN
  Administered 2021-01-30: 160 ug via INTRAVENOUS
  Administered 2021-01-30 (×2): 120 ug via INTRAVENOUS
  Administered 2021-01-30: 80 ug via INTRAVENOUS

## 2021-01-30 MED ORDER — VANCOMYCIN HCL 1500 MG/300ML IV SOLN
1500.0000 mg | INTRAVENOUS | Status: AC
  Administered 2021-01-30: 1500 mg via INTRAVENOUS
  Filled 2021-01-30: qty 300

## 2021-01-30 MED ORDER — LIDOCAINE 2% (20 MG/ML) 5 ML SYRINGE
INTRAMUSCULAR | Status: DC | PRN
  Administered 2021-01-30: 100 mg via INTRAVENOUS

## 2021-01-30 MED ORDER — SODIUM CHLORIDE 0.9% FLUSH: 3.0000 mL | Freq: Two times a day (BID) | INTRAVENOUS | Status: DC

## 2021-01-30 MED ORDER — FENTANYL CITRATE (PF) 250 MCG/5ML IJ SOLN
INTRAMUSCULAR | Status: DC | PRN
  Administered 2021-01-30 (×2): 25 ug via INTRAVENOUS

## 2021-01-30 MED ORDER — PHENYLEPHRINE HCL-NACL 20-0.9 MG/250ML-% IV SOLN
INTRAVENOUS | Status: DC | PRN
  Administered 2021-01-30: 60 ug/min via INTRAVENOUS

## 2021-01-30 MED ORDER — HEPARIN 6000 UNIT IRRIGATION SOLUTION
Status: DC | PRN
  Administered 2021-01-30: 1

## 2021-01-30 MED ORDER — ONDANSETRON HCL 4 MG/2ML IJ SOLN: 4.0000 mg | Freq: Four times a day (QID) | INTRAMUSCULAR | Status: DC | PRN

## 2021-01-30 MED ORDER — PROPOFOL 10 MG/ML IV BOLUS
INTRAVENOUS | Status: AC
  Filled 2021-01-30: qty 20

## 2021-01-30 MED ORDER — PROPOFOL 10 MG/ML IV BOLUS
INTRAVENOUS | Status: DC | PRN
  Administered 2021-01-30: 50 mg via INTRAVENOUS
  Administered 2021-01-30: 150 mg via INTRAVENOUS

## 2021-01-30 MED ORDER — LABETALOL HCL 5 MG/ML IV SOLN: 10.0000 mg | INTRAVENOUS | Status: DC | PRN

## 2021-01-30 MED ORDER — CHLORHEXIDINE GLUCONATE 4 % EX LIQD: 60.0000 mL | Freq: Once | CUTANEOUS | Status: DC

## 2021-01-30 MED ORDER — MORPHINE SULFATE (PF) 2 MG/ML IV SOLN: 2.0000 mg | INTRAVENOUS | Status: DC | PRN

## 2021-01-30 MED ORDER — CHLORHEXIDINE GLUCONATE 0.12 % MT SOLN
15.0000 mL | Freq: Once | OROMUCOSAL | Status: AC
  Administered 2021-01-30: 15 mL via OROMUCOSAL
  Filled 2021-01-30: qty 15

## 2021-01-30 MED ORDER — EPHEDRINE SULFATE-NACL 50-0.9 MG/10ML-% IV SOSY
PREFILLED_SYRINGE | INTRAVENOUS | Status: DC | PRN
  Administered 2021-01-30 (×2): 5 mg via INTRAVENOUS
  Administered 2021-01-30: 10 mg via INTRAVENOUS
  Administered 2021-01-30 (×2): 5 mg via INTRAVENOUS
  Administered 2021-01-30: 10 mg via INTRAVENOUS

## 2021-01-30 MED ORDER — SODIUM CHLORIDE 0.9 % IV SOLN: INTRAVENOUS | Status: DC

## 2021-01-30 MED ORDER — HEPARIN SODIUM (PORCINE) 1000 UNIT/ML IJ SOLN
INTRAMUSCULAR | Status: DC | PRN
  Administered 2021-01-30: 10000 [IU] via INTRAVENOUS

## 2021-01-30 MED ORDER — LACTATED RINGERS IV SOLN: INTRAVENOUS | Status: DC | PRN

## 2021-01-30 MED ORDER — HEPARIN SODIUM (PORCINE) 1000 UNIT/ML IJ SOLN
INTRAMUSCULAR | Status: AC
  Filled 2021-01-30: qty 10

## 2021-01-30 MED ORDER — SODIUM CHLORIDE 0.9 % WEIGHT BASED INFUSION: 1.0000 mL/kg/h | INTRAVENOUS | Status: DC

## 2021-01-30 MED ORDER — SODIUM CHLORIDE 0.9% FLUSH: 3.0000 mL | INTRAVENOUS | Status: DC | PRN

## 2021-01-30 MED ORDER — LACTATED RINGERS IV SOLN: INTRAVENOUS | Status: DC

## 2021-01-30 MED ORDER — ONDANSETRON HCL 4 MG/2ML IJ SOLN
INTRAMUSCULAR | Status: DC | PRN
  Administered 2021-01-30: 4 mg via INTRAVENOUS

## 2021-01-30 MED ORDER — ACETAMINOPHEN 325 MG PO TABS: 650.0000 mg | ORAL_TABLET | ORAL | Status: DC | PRN

## 2021-01-30 MED ORDER — HYDRALAZINE HCL 20 MG/ML IJ SOLN: 5.0000 mg | INTRAMUSCULAR | Status: DC | PRN

## 2021-01-30 MED ORDER — SODIUM CHLORIDE 0.9 % IV SOLN: 250.0000 mL | INTRAVENOUS | Status: DC | PRN

## 2021-01-30 MED ORDER — FENTANYL CITRATE (PF) 250 MCG/5ML IJ SOLN
INTRAMUSCULAR | Status: AC
  Filled 2021-01-30: qty 5

## 2021-01-30 SURGICAL SUPPLY — 46 items
ADH SKN CLS APL DERMABOND .7 (GAUZE/BANDAGES/DRESSINGS) ×2
APL PRP STRL LF DISP 70% ISPRP (MISCELLANEOUS)
BAG BANDED W/RUBBER/TAPE 36X54 (MISCELLANEOUS) ×4 IMPLANT
BAG COUNTER SPONGE SURGICOUNT (BAG) ×3 IMPLANT
BAG EQP BAND 135X91 W/RBR TAPE (MISCELLANEOUS) ×2
BAG SNAP BAND KOVER 36X36 (MISCELLANEOUS) ×4 IMPLANT
BAG SPNG CNTER NS LX DISP (BAG) ×2
BAG SURGICOUNT SPONGE COUNTING (BAG) ×1
BLADE SURG 11 STRL SS (BLADE) ×4 IMPLANT
CANISTER SUCT 3000ML PPV (MISCELLANEOUS) ×4 IMPLANT
CATH OMNI FLUSH .035X70CM (CATHETERS) IMPLANT
CATH OMNI FLUSH 5F 65CM (CATHETERS) ×2 IMPLANT
CHLORAPREP W/TINT 26 (MISCELLANEOUS) IMPLANT
COVER PROBE W GEL 5X96 (DRAPES) ×4 IMPLANT
DCB RANGER 6.0X40 135 (BALLOONS) IMPLANT
DERMABOND ADVANCED (GAUZE/BANDAGES/DRESSINGS) ×2
DERMABOND ADVANCED .7 DNX12 (GAUZE/BANDAGES/DRESSINGS) ×4 IMPLANT
DEVICE VASC CLSR CELT ART 6 (Vascular Products) ×2 IMPLANT
GLOVE SRG 8 PF TXTR STRL LF DI (GLOVE) ×2 IMPLANT
GLOVE SURG POLYISO LF SZ7.5 (GLOVE) ×4 IMPLANT
GLOVE SURG UNDER POLY LF SZ8 (GLOVE) ×4
GOWN STRL REUS W/ TWL LRG LVL3 (GOWN DISPOSABLE) ×4 IMPLANT
GOWN STRL REUS W/ TWL XL LVL3 (GOWN DISPOSABLE) ×2 IMPLANT
GOWN STRL REUS W/TWL LRG LVL3 (GOWN DISPOSABLE) ×8
GOWN STRL REUS W/TWL XL LVL3 (GOWN DISPOSABLE) ×4
KIT BASIN OR (CUSTOM PROCEDURE TRAY) ×4 IMPLANT
KIT ENCORE 26 ADVANTAGE (KITS) ×2 IMPLANT
KIT TURNOVER KIT B (KITS) ×4 IMPLANT
PACK ENDO MINOR (CUSTOM PROCEDURE TRAY) ×2 IMPLANT
PAD ARMBOARD 7.5X6 YLW CONV (MISCELLANEOUS) ×8 IMPLANT
PROTECTION STATION PRESSURIZED (MISCELLANEOUS) ×4
RANGER DCB 6.0X40 135 (BALLOONS) ×4
SET MICROPUNCTURE 5F STIFF (MISCELLANEOUS) ×4 IMPLANT
SHEATH PINNACLE 5F 10CM (SHEATH) ×2 IMPLANT
SHEATH PINNACLE 6F 10CM (SHEATH) ×2 IMPLANT
SHEATH PINNACLE ST 6F 45CM (SHEATH) ×2 IMPLANT
STATION PROTECTION PRESSURIZED (MISCELLANEOUS) ×2 IMPLANT
STOPCOCK MORSE 400PSI 3WAY (MISCELLANEOUS) ×4 IMPLANT
SYR 10ML LL (SYRINGE) ×12 IMPLANT
SYR 30ML LL (SYRINGE) ×4 IMPLANT
SYR 3ML LL SCALE MARK (SYRINGE) ×2 IMPLANT
SYR MEDRAD MARK V 150ML (SYRINGE) IMPLANT
TOWEL GREEN STERILE (TOWEL DISPOSABLE) ×8 IMPLANT
TUBING HIGH PRESSURE 120CM (CONNECTOR) ×4 IMPLANT
WIRE BENTSON .035X145CM (WIRE) ×4 IMPLANT
WIRE G V18X300CM (WIRE) ×2 IMPLANT

## 2021-01-30 NOTE — Transfer of Care (Signed)
Immediate Anesthesia Transfer of Care Note  Patient: Michael [name]  Procedure(s) Performed: LOWER EXTREMITY ANGIOGRAM WITH ARTERIOGRAM OF RIGHT LOWER EXTREMITY (Right: Leg Upper) ULTRASOUND GUIDANCE FOR VASCULAR ACCESS (Left: Groin) BALLOON ANGIOPLASTY (Right: Leg Upper)  Patient Location: PACU  Anesthesia Type:General  Level of Consciousness: drowsy  Airway & Oxygen Therapy: Patient Spontanous Breathing and Patient connected to face mask oxygen  Post-op Assessment: Report given to RN and Post -op Vital signs reviewed and stable  Post vital signs: Reviewed and stable  Last Vitals:  Vitals Value Taken Time  BP 117/61 01/30/21 1058  Temp    Pulse 77 01/30/21 1059  Resp 13 01/30/21 1059  SpO2 99 % 01/30/21 1059  Vitals shown include unvalidated device data.  Last Pain:  Vitals:   01/30/21 0757  TempSrc:   PainSc: 0-No pain         Complications: No notable events documented.

## 2021-01-30 NOTE — Interval H&P Note (Signed)
History and Physical Interval Note:  01/30/2021 9:13 AM  [name]  has presented today for surgery, with the diagnosis of PAD.  The various methods of treatment have been discussed with the patient and family. After consideration of risks, benefits and other options for treatment, the patient has consented to  Procedure(s): LOWER EXTREMITY ANGIOGRAM WITH ARTERIOGRAM OF RIGHT LOWER EXTREMITY AND POSSIBLE INTERVENTION (Right) as a surgical intervention.  The patient's history has been reviewed, patient examined, no change in status, stable for surgery.  I have reviewed the patient's chart and labs.  Questions were answered to the patient's satisfaction.     Michael [name]

## 2021-01-30 NOTE — Anesthesia Procedure Notes (Signed)
Procedure Name: Intubation Date/Time: 01/30/2021 9:32 AM Performed by: Erick Colace, CRNA Pre-anesthesia Checklist: Patient identified, Emergency Drugs available, Suction available and Patient being monitored Patient Re-evaluated:Patient Re-evaluated prior to induction Oxygen Delivery Method: Circle system utilized Preoxygenation: Pre-oxygenation with 100% oxygen Induction Type: IV induction Ventilation: Mask ventilation without difficulty LMA: LMA inserted LMA Size: 4.0 Tube type: Oral Number of attempts: 1 Placement Confirmation: positive ETCO2 and breath sounds checked- equal and bilateral Tube secured with: Tape Dental Injury: Teeth and Oropharynx as per pre-operative assessment

## 2021-01-30 NOTE — Op Note (Signed)
Patient name: Michael [name] MRN: [id number] DOB: [birth year] Sex: male  01/30/2021 Pre-operative Diagnosis: In-stent stenosis, right leg Post-operative diagnosis:  Same Surgeon:  Annamarie Major Procedure Performed:  1.  Ultrasound-guided access, left femoral artery  2.  Abdominal aortogram  3.  Right lower extremity runoff  4.  Drug-coated balloon angioplasty, right superficial femoral artery  5.  Closure device, Celt     Indications: This is a 70 year old gentleman with previous history of right superficial femoral artery stenting.  He has developed recurrent claudication symptoms.  Ultrasound identified a high-grade in-stent stenosis.  He is here today for further evaluation.  Procedure: The patient was identified in the holding area and taken to room 16.  LMA anesthesia was induced.  A timeout was called.  Ultrasound was used to evaluate the left common femoral artery.  It was patent .  A digital ultrasound image was acquired.  A micropuncture needle was used to access the left common femoral artery under ultrasound guidance.  An 018 wire was advanced without resistance and a micropuncture sheath was placed.  The 018 wire was removed and a benson wire was placed.  The micropuncture sheath was exchanged for a 5 french sheath.  An omniflush catheter was advanced over the wire to the level of L-1.  An abdominal angiogram was obtained.  Next, using the omniflush catheter and a benson wire, the aortic bifurcation was crossed and the catheter was placed into theright external iliac artery and right runoff was obtained.    Findings:   Aortogram: No significant aortic stenosis was identified.  There was no significant iliac stenosis.  Right Lower Extremity: Approximate 70% right common femoral artery stenosis.  The profundofemoral artery was widely patent.  There was calcific plaque with approximately 60% stenosis in the proximal superficial femoral artery.  The superficial femoral artery was  patent throughout its course and heavily calcified.  The stents within the right superficial femoral artery were patent however within the stent there was a focal lesion of approximately 80-90%.  The popliteal artery is widely patent.  The dominant runoff vessels the peroneal artery which reconstitutes the posterior tibial at the ankle.  Left Lower Extremity: Not evaluated  Intervention: After the above images were acquired the decision made to proceed with intervention.  A 6 French 45 cm sheath was inserted and advanced into the right external iliac artery.  The patient was fully heparinized.  ACT levels were checked.  I then advanced a V-18 wire across the lesion within the superficial femoral artery.  I performed drug-coated balloon angioplasty of the in-stent stenosis.  This was a 6 x 40 Ranger balloon.  It was inflated to 10 atm for 3 minutes.  Completion imaging showed complete resolution of the stenosis.  This point catheters and wires were removed.  The groin was closed with a Celt device without complication  Impression:  #1  Successful drug-coated balloon angioplasty of a 80-90% in-stent stenosis within the right superficial femoral artery.  There was no residual stenosis.  This was a 6 x 40 Ranger balloon.  #2  Approximate 70% stenosis within the common femoral artery as well as the proximal superficial femoral artery.  If the patient continues to have symptoms, he will need to be considered for right femoral and proximal superficial femoral endarterectomy with patch angioplasty.  #3  The patient will follow-up in 1 month with discussions for surgical intervention if his symptoms have not resolved.   Theotis Burrow, M.D., FACS  [facility] Office: [phone] Pager:  [phone]

## 2021-01-30 NOTE — Anesthesia Postprocedure Evaluation (Signed)
Anesthesia Post Note  Patient: ADVIK WEATHERSPOON  Procedure(s) Performed: LOWER EXTREMITY ANGIOGRAM WITH ARTERIOGRAM OF RIGHT LOWER EXTREMITY (Right: Leg Upper) ULTRASOUND GUIDANCE FOR VASCULAR ACCESS (Left: Groin) BALLOON ANGIOPLASTY (Right: Leg Upper)     Patient location during evaluation: PACU Anesthesia Type: General Level of consciousness: awake and alert Pain management: pain level controlled Vital Signs Assessment: post-procedure vital signs reviewed and stable Respiratory status: spontaneous breathing, nonlabored ventilation and respiratory function stable Cardiovascular status: blood pressure returned to baseline and stable Postop Assessment: no apparent nausea or vomiting Anesthetic complications: no   No notable events documented.  Last Vitals:  Vitals:   01/30/21 1350 01/30/21 1419  BP: 111/76 111/87  Pulse: 70 71  Resp:    Temp:    SpO2: 97% 96%    Last Pain:  Vitals:   01/30/21 1430  TempSrc:   PainSc: 0-No pain                 Catalina Gravel

## 2021-01-30 NOTE — Discharge Instructions (Signed)
Femoral Site Care This sheet gives you information about how to care for yourself after your procedure. Your health care provider may also give you more specific instructions. If you have problems or questions, contact your health care provider. What can I expect after the procedure? After the procedure, it is common to have: Bruising that usually fades within 1-2 weeks. Tenderness at the site. Follow these instructions at home: Wound care May remove bandage after 24 hours. Do not take baths, swim, or use a hot tub for 5 days. You may shower 24-48 hours after the procedure. Gently wash the site with plain soap and water. Pat the area dry with a clean towel. Do not rub the site. This may cause bleeding. Do not apply powder or lotion to the site. Keep the site clean and dry. Check your femoral site every day for signs of infection. Check for: Redness, swelling, or pain. Fluid or blood. Warmth. Pus or a bad smell. Activity For the first 2-3 days after your procedure, or as long as directed: Avoid climbing stairs as much as possible. Do not squat. Do not lift, push or pull anything that is heavier than 10 lb for 5 days. Rest as directed. Avoid sitting for a long time without moving. Get up to take short walks every 1-2 hours. Do not drive for 24 hours. General instructions Take over-the-counter and prescription medicines only as told by your health care provider. Keep all follow-up visits as told by your health care provider. This is important. DRINK PLENTY OF FLUIDS FOR THE NEXT 2-3 DAYS. Contact a health care provider if you have: A fever or chills. You have redness, swelling, or pain around your insertion site. Get help right away if: The catheter insertion area swells very fast. You pass out. You suddenly start to sweat or your skin gets clammy. The catheter insertion area is bleeding, and the bleeding does not stop when you hold steady pressure on the area. The area near or  just beyond the catheter insertion site becomes pale, cool, tingly, or numb. These symptoms may represent a serious problem that is an emergency. Do not wait to see if the symptoms will go away. Get medical help right away. Call your local emergency services (911 in the [place] Do not drive yourself to the hospital. Summary After the procedure, it is common to have bruising that usually fades within 1-2 weeks. Check your femoral site every day for signs of infection. Do not lift, push or pull anything that is heavier than 10 lb for 5 days.  This information is not intended to replace advice given to you by your health care provider. Make sure you discuss any questions you have with your health care provider. Document Revised: 03/01/2017 Document Reviewed: 03/01/2017 Elsevier Patient Education  2020 [employer]  

## 2021-01-30 NOTE — Progress Notes (Signed)
Verified and confirmed with pharmacist, Hetty Blend that Vanc 1500 mg was an appropriate dose.

## 2021-02-01 ENCOUNTER — Encounter (HOSPITAL_COMMUNITY): Payer: Self-pay | Admitting: Surgery

## 2021-02-20 ENCOUNTER — Other Ambulatory Visit: Payer: Self-pay | Admitting: *Deleted

## 2021-02-20 DIAGNOSIS — I739 Peripheral vascular disease, unspecified: Secondary | ICD-10-CM

## 2021-02-28 ENCOUNTER — Ambulatory Visit: Payer: PRIVATE HEALTH INSURANCE

## 2021-02-28 ENCOUNTER — Encounter (HOSPITAL_COMMUNITY): Payer: PRIVATE HEALTH INSURANCE

## 2021-03-10 ENCOUNTER — Other Ambulatory Visit: Payer: Self-pay

## 2021-03-10 ENCOUNTER — Ambulatory Visit (INDEPENDENT_AMBULATORY_CARE_PROVIDER_SITE_OTHER)
Admission: RE | Admit: 2021-03-10 | Discharge: 2021-03-10 | Disposition: A | Discharge status: Home / Self Care | Payer: Medicare Other | Source: Ambulatory Visit | Attending: Physician Assistant | Admitting: Physician Assistant

## 2021-03-10 ENCOUNTER — Ambulatory Visit (HOSPITAL_COMMUNITY)
Admission: RE | Admit: 2021-03-10 | Discharge: 2021-03-10 | Disposition: A | Discharge status: Home / Self Care | Payer: Medicare Other | Source: Ambulatory Visit | Attending: Physician Assistant | Admitting: Physician Assistant

## 2021-03-10 ENCOUNTER — Ambulatory Visit (INDEPENDENT_AMBULATORY_CARE_PROVIDER_SITE_OTHER): Payer: Medicare Other | Admitting: Physician Assistant

## 2021-03-10 VITALS — BP 109/71 | HR 81 | Temp 98.2°F | Resp 20 | Ht 72.0 in | Wt 232.4 lb

## 2021-03-10 DIAGNOSIS — I739 Peripheral vascular disease, unspecified: Secondary | ICD-10-CM

## 2021-03-10 NOTE — Progress Notes (Signed)
Office Note     CC:  follow up Requesting Provider:  Rolene Course, PA-C  HPI: Michael [name] is a 71 y.o. (1950/05/26) male who presents for follow up of PAD. He has history of primarily right lower extremity claudication symptoms. He has remote history of right SFA stenting by Dr. Trula Slade in September of 2012. He since has had two interventions on the RLE for in stent restenosis, once in July of 2018, and again in January of 2019. Following his interventions he had resolution of his symptoms.   At his last visit in November he was having some recurrent symptoms in the RLE. He was able to ambulate about 50 yards before he became symptomatic. His symptoms would improve with rest. He was not having any rest pain or tissue loss. He was compliant with his Plavix and Statin.On duplex he has elevated velocities in his right SFA and was indicated for possible endovascular intervention. He subsequently was scheduled for Arteriogram with possible intervention. On 01/30/21 he underwent Aortogram, Arteriogram with RLE DCB angioplasty of his right SFA by Dr. Trula Slade. He has successful treatment of 80-90% in stent re stenosis of his right SFA. Of note he had 70% right common femoral artery stenosis and it was discussed with him that if he had persistent symptoms he would need surgical intervention.   Today he returns for follow up with non invasive studies. He says his right lower extremity feels better after the intervention. He does still report claudication symptoms when going up stairs or an incline at his house. This is relieved with rest. He does have some hip claudication like symptoms that occur at times when ambulating. He says he gets a cramping like sensation in his buttock but if he stops and stretches his legs out it goes away. Also after some prolonged sitting or if he crosses his knee over his other leg he reports some numbness and discomfort in right calf.  This is resolved by getting up and  walking around his house. He has no pain at rest. No non healing wounds. He has known sciatic nerve issues. He is compliant with his statin and Plavix  The pt is on a statin for cholesterol management.  The pt is not on a daily aspirin.   Other AC:  Plavix The pt is on ACE, CCB, BB for hypertension.   The pt is diabetic.  Tobacco hx:  Former  Past Medical History:  Diagnosis Date   Allergy    Anxiety    Diabetes mellitus    Hyperlipidemia    Hypertension    PAD (peripheral artery disease) ([place])    a. s/p prior LE stenting 2012, 03/2017 - followed by [facility].   Prostate cancer ([place])    Sleep apnea 2016   wears CPAP sometimes    Past Surgical History:  Procedure Laterality Date   ABDOMINAL AORTOGRAM N/A 09/01/2016   Procedure: Abdominal Aortogram;  Surgeon: Serafina Mitchell, MD;  Location: [facility];  Service: Cardiovascular;  Laterality: N/A;   ABDOMINAL AORTOGRAM W/LOWER EXTREMITY N/A 03/16/2017   Procedure: ABDOMINAL AORTOGRAM W/LOWER EXTREMITY;  Surgeon: Serafina Mitchell, MD;  Location: [facility];  Service: Cardiovascular;  Laterality: N/A;   ANGIOPLASTY Right 01/30/2021   Procedure: BALLOON ANGIOPLASTY;  Surgeon: Serafina Mitchell, MD;  Location[facility];  Service: Vascular;  Laterality: Right;   ANGIOPLASTY / STENTING FEMORAL  11/05/2010   Left SFA stent   KNEE ARTHROSCOPY     LOWER  EXTREMITY ANGIOGRAM Right 01/30/2021   Procedure: LOWER EXTREMITY ANGIOGRAM WITH ARTERIOGRAM OF RIGHT LOWER EXTREMITY;  Surgeon: Serafina Mitchell, MD;  Location: [facility];  Service: Vascular;  Laterality: Right;   LOWER EXTREMITY ANGIOGRAPHY N/A 09/01/2016   Procedure: Lower Extremity Angiography;  Surgeon: Serafina Mitchell, MD;  Location: [facility];  Service: Cardiovascular;  Laterality: N/A;   PERIPHERAL VASCULAR ATHERECTOMY Right 09/01/2016   Procedure: Peripheral Vascular Atherectomy;  Surgeon: Serafina Mitchell, MD;  Location: [facility];  Service: Cardiovascular;   Laterality: Right;  superficial femoral   PERIPHERAL VASCULAR INTERVENTION Right 03/16/2017   Procedure: PERIPHERAL VASCULAR INTERVENTION;  Surgeon: Serafina Mitchell, MD;  Location: [facility];  Service: Cardiovascular;  Laterality: Right;  superficicial femoral   ROTATOR CUFF REPAIR     1990   ROTATOR CUFF REPAIR Bilateral 2017   ULTRASOUND GUIDANCE FOR VASCULAR ACCESS Left 01/30/2021   Procedure: ULTRASOUND GUIDANCE FOR VASCULAR ACCESS;  Surgeon: Serafina Mitchell, MD;  Location: [facility];  Service: Vascular;  Laterality: Left;    Social History   Socioeconomic History   Marital status: Married    Spouse name: Not on file   Number of children: Not on file   Years of education: Not on file   Highest education level: Not on file  Occupational History   Not on file  Tobacco Use   Smoking status: Never   Smokeless tobacco: Former    Quit date: 10/26/1981  Vaping Use   Vaping Use: Never used  Substance and Sexual Activity   Alcohol use: Yes    Comment: occasional drink, maybe a glass of wine once every 6 months   Drug use: No   Sexual activity: Not on file  Other Topics Concern   Not on file  Social History Narrative   Not on file   Social Determinants of Health   Financial Resource Strain: Not on file  Food Insecurity: Not on file  Transportation Needs: Not on file  Physical Activity: Not on file  Stress: Not on file  Social Connections: Not on file  Intimate Partner Violence: Not on file    Family History  Problem Relation Age of Onset   Hyperlipidemia Mother    Hypertension Mother    Heart disease Father    Deep vein thrombosis Father    Hyperlipidemia Father    Hypertension Father    Heart attack Father    Peripheral vascular disease Father    Cancer Sister     Current Outpatient Medications  Medication Sig Dispense Refill   ACCU-CHEK AVIVA PLUS test strip AS DIRECTED SEVEN TIMES A DAY IN VITRO 90 DAYS  4   amLODipine (NORVASC) 10 MG tablet Take 10 mg  by mouth daily.     atorvastatin (LIPITOR) 40 MG tablet Take 40 mg by mouth at bedtime.  5   benazepril (LOTENSIN) 40 MG tablet Take 40 mg by mouth daily.     clonazePAM (KLONOPIN) 0.5 MG tablet Take 1 mg by mouth at bedtime as needed (Sleep).  5   clonazePAM (KLONOPIN) 1 MG tablet Take 1 mg by mouth daily.     clopidogrel (PLAVIX) 75 MG tablet TAKE 1 TABLET BY MOUTH EVERY DAY WITH BREAKFAST 90 tablet 3   Continuous Blood Gluc Receiver (FREESTYLE LIBRE 14 DAY READER) DEVI use to monitor blood sugar     dapagliflozin propanediol (FARXIGA) 5 MG TABS tablet 10 mg daily.     diclofenac Sodium (VOLTAREN) 1 %  GEL Apply 2 g topically daily as needed (pain).     famotidine (PEPCID) 20 MG tablet Take 20 mg by mouth 2 (two) times daily.     fluticasone (FLONASE) 50 MCG/ACT nasal spray Place 2 sprays into both nostrils at bedtime as needed for allergies (before uses CPAP if needed).     folic acid (FOLVITE) 1 MG tablet Take 1 mg by mouth daily.     HUMALOG KWIKPEN 100 UNIT/ML KwikPen 40 Units 2 (two) times daily.     hydrochlorothiazide (HYDRODIURIL) 25 MG tablet Take 1 tablet (25 mg total) by mouth daily. Please make overdue appt with Dr. Irish Lack before anymore refills. 2nd attempt 15 tablet 0   ibuprofen (ADVIL,MOTRIN) 200 MG tablet Take 800 mg by mouth daily as needed for headache.     insulin aspart (NOVOLOG FLEXPEN) 100 UNIT/ML FlexPen Novolog FlexPen U-100 Insulin aspart 100 unit/mL (3 mL) subcutaneous  USE 18 UNITS IF SUGAR 88-126. THEN 1 UNIT FOR EVERY 40 MG/DL ABOVE GOAL     insulin glargine (LANTUS) 100 UNIT/ML injection Inject 15 Units into the skin at bedtime. 2 hour before bed     metFORMIN (GLUCOPHAGE-XR) 500 MG 24 hr tablet 2,000 mg daily with supper.     methotrexate 2.5 MG tablet Take 15 mg by mouth once a week. Tuesday     metoprolol succinate (TOPROL-XL) 100 MG 24 hr tablet Take 100 mg by mouth daily.      solifenacin (VESICARE) 5 MG tablet Take 5 mg by mouth daily.     tamsulosin  (FLOMAX) 0.4 MG CAPS capsule 0.4 mg at bedtime.  5   TRULICITY 6.26 RS/8.5IO SOPN Inject 0.75 mg into the skin once a week.     No current facility-administered medications for this visit.    Allergies  Allergen Reactions   Demerol Anaphylaxis   Lactose Intolerance (Gi) Anaphylaxis   Oxycodone Anxiety and Other (See Comments)    Suicidal ideation   Penicillins Anaphylaxis and Other (See Comments)   Actos [Pioglitazone] Nausea Only   Exenatide Nausea Only and Other (See Comments)   Fentanyl Other (See Comments)    Jittery    Glimepiride Other (See Comments)    Edema in ankles    Nabumetone Nausea Only and Other (See Comments)   Victoza [Liraglutide] Nausea Only    Shaking   Hydrocodone Anxiety    Suicidal ideation     REVIEW OF SYSTEMS:  [X]  denotes positive finding, [ ]  denotes negative finding Cardiac  Comments:  Chest pain or chest pressure:    Shortness of breath upon exertion:    Short of breath when lying flat:    Irregular heart rhythm:        Vascular    Pain in calf, thigh, or hip brought on by ambulation:    Pain in feet at night that wakes you up from your sleep:     Blood clot in your veins:    Leg swelling:         Pulmonary    Oxygen at home:    Productive cough:     Wheezing:         Neurologic    Sudden weakness in arms or legs:     Sudden numbness in arms or legs:     Sudden onset of difficulty speaking or slurred speech:    Temporary loss of vision in one eye:     Problems with dizziness:         Gastrointestinal  Blood in stool:     Vomited blood:         Genitourinary    Burning when urinating:     Blood in urine:        Psychiatric    Major depression:         Hematologic    Bleeding problems:    Problems with blood clotting too easily:        Skin    Rashes or ulcers:        Constitutional    Fever or chills:      PHYSICAL EXAMINATION:  Vitals:   03/10/21 1342  BP: 109/71  Pulse: 81  Resp: 20  Temp: 98.2 F  (36.8 C)  TempSrc: Temporal  SpO2: 96%  Weight: 232 lb 6.4 oz (105.4 kg)  Height: 6' (1.829 m)    General:  WDWN in NAD; vital signs documented above Gait: Normal HENT: WNL, normocephalic Pulmonary: normal non-labored breathing , without  wheezing Cardiac:  HR, without  Murmurs without carotid bruit Abdomen: soft, NT, no masses Vascular Exam/Pulses:  Right Left  Radial 2+ (normal) 2+ (normal)  Femoral 1+ (weak) 2+ (normal)  Popliteal Not palpable Not palpable  DP absent 2+ (normal)  PT 1+ (weak) 1+ (weak)   Extremities: without ischemic changes, without Gangrene , without cellulitis; without open wounds;  Musculoskeletal: no muscle wasting or atrophy  Neurologic: A&O X 3;  No focal weakness or paresthesias are detected Psychiatric:  The pt has Normal affect.   Non-Invasive Vascular Imaging:   +-------+-----------+-----------+------------+------------+   ABI/TBI Today's ABI Today's TBI Previous ABI Previous TBI   +-------+-----------+-----------+------------+------------+   Right   0.61        0.34        0.71         0.53           +-------+-----------+-----------+------------+------------+   Left    1.17        0.95        1.13         0.89           +-------+-----------+-----------+------------+------------+   VAS [place] Lower Extremity Arterial Duplex:  +-----------+--------+-----+--------+----------+---------------------------  ----+   RIGHT       PSV cm/s Ratio Stenosis Waveform   Comments                           +-----------+--------+-----+--------+----------+---------------------------  ----+   CFA Mid     61                      triphasic                                     +-----------+--------+-----+--------+----------+---------------------------  ----+   DFA         210                     triphasic                                     +-----------+--------+-----+--------+----------+---------------------------  ----+   SFA Prox                   occluded                                                +-----------+--------+-----+--------+----------+---------------------------  ----+  SFA Mid                    occluded                                               +-----------+--------+-----+--------+----------+---------------------------  ----+   SFA Distal  19                      monophasic reconstitution of flow via                                                         collateral                         +-----------+--------+-----+--------+----------+---------------------------  ----+   POP Mid     19                      monophasic                                    +-----------+--------+-----+--------+----------+---------------------------  ----+   ATA Distal  13                      monophasic                                    +-----------+--------+-----+--------+----------+---------------------------  ----+   PTA Distal  12                      monophasic                                    +-----------+--------+-----+--------+----------+---------------------------  ----+   PERO Distal 24                      monophasic                                    +-----------+--------+-----+--------+----------+---------------------------  ----+    Summary:  Right: Total occlusion noted in the superficial femoral artery/SFA stent with reconstitution distally via collateral flow.   ASSESSMENT/PLAN:: 71 y.o. male here for follow up for PAD. He recently underwent  Aortogram, Arteriogram with RLE DCB angioplasty of his right SFA by Dr. Trula Slade on 01/30/21. This was for lifestyle limiting claudication. He had successful treatment of 80-90% in stent re stenosis of his right SFA. Of note he had 70% right common femoral artery stenosis and it was discussed with him that if he had persistent symptoms he would need surgical intervention. He continues to have some claudication symptoms but it is not disabling. His right ABI today has decreased and  his RLE duplex shows occluded right SFA stent. At this time I would not recommend repeat intervention. His symptoms are presently not disabling. He has no rest pain  or ischemic symptoms.  I have encouraged him to continue exercise/ ambulation to help promote collaterals. - Continue Aspirin and Plavix - He will follow up sooner if he has new or worsening symptoms - He will follow up in 3 months with ABI    Karoline Caldwell, PA-C [facility] [phone]  Clinic MD:   Trula Slade

## 2021-03-14 ENCOUNTER — Other Ambulatory Visit: Payer: Self-pay | Admitting: *Deleted

## 2021-03-14 DIAGNOSIS — I739 Peripheral vascular disease, unspecified: Secondary | ICD-10-CM

## 2021-03-18 DIAGNOSIS — E1151 Type 2 diabetes mellitus with diabetic peripheral angiopathy without gangrene: Secondary | ICD-10-CM | POA: Diagnosis not present

## 2021-03-18 DIAGNOSIS — R7309 Other abnormal glucose: Secondary | ICD-10-CM | POA: Diagnosis not present

## 2021-03-18 DIAGNOSIS — J340 Abscess, furuncle and carbuncle of nose: Secondary | ICD-10-CM | POA: Diagnosis not present

## 2021-03-18 DIAGNOSIS — Z125 Encounter for screening for malignant neoplasm of prostate: Secondary | ICD-10-CM | POA: Diagnosis not present

## 2021-03-24 DIAGNOSIS — Z794 Long term (current) use of insulin: Secondary | ICD-10-CM | POA: Diagnosis not present

## 2021-03-24 DIAGNOSIS — Z8546 Personal history of malignant neoplasm of prostate: Secondary | ICD-10-CM | POA: Diagnosis not present

## 2021-03-24 DIAGNOSIS — E1151 Type 2 diabetes mellitus with diabetic peripheral angiopathy without gangrene: Secondary | ICD-10-CM | POA: Diagnosis not present

## 2021-03-24 DIAGNOSIS — E1165 Type 2 diabetes mellitus with hyperglycemia: Secondary | ICD-10-CM | POA: Diagnosis not present

## 2021-03-24 DIAGNOSIS — Z79899 Other long term (current) drug therapy: Secondary | ICD-10-CM | POA: Diagnosis not present

## 2021-03-24 DIAGNOSIS — L4050 Arthropathic psoriasis, unspecified: Secondary | ICD-10-CM | POA: Diagnosis not present

## 2021-03-26 ENCOUNTER — Telehealth: Payer: Self-pay | Admitting: Surgery

## 2021-03-26 ENCOUNTER — Telehealth: Payer: Self-pay

## 2021-03-26 NOTE — Telephone Encounter (Signed)
Pt called with request to proceed with scheduling surgery, but would like to speak to MD first. MD has been made aware.

## 2021-03-26 NOTE — Telephone Encounter (Signed)
We spoke tonight about right femoral endarterectomy and fem-pop AK bypass.  He is contemplating surgery and will contact me in the next few days.  He will need vein mapping  Michael [name]

## 2021-03-30 DIAGNOSIS — Z794 Long term (current) use of insulin: Secondary | ICD-10-CM | POA: Diagnosis not present

## 2021-03-30 DIAGNOSIS — E119 Type 2 diabetes mellitus without complications: Secondary | ICD-10-CM | POA: Diagnosis not present

## 2021-03-30 DIAGNOSIS — E1165 Type 2 diabetes mellitus with hyperglycemia: Secondary | ICD-10-CM | POA: Diagnosis not present

## 2021-04-01 DIAGNOSIS — E1142 Type 2 diabetes mellitus with diabetic polyneuropathy: Secondary | ICD-10-CM | POA: Diagnosis not present

## 2021-04-01 DIAGNOSIS — I10 Essential (primary) hypertension: Secondary | ICD-10-CM | POA: Diagnosis not present

## 2021-04-01 DIAGNOSIS — E1151 Type 2 diabetes mellitus with diabetic peripheral angiopathy without gangrene: Secondary | ICD-10-CM | POA: Diagnosis not present

## 2021-04-01 DIAGNOSIS — E782 Mixed hyperlipidemia: Secondary | ICD-10-CM | POA: Diagnosis not present

## 2021-04-03 ENCOUNTER — Other Ambulatory Visit: Payer: Self-pay

## 2021-04-03 DIAGNOSIS — I739 Peripheral vascular disease, unspecified: Secondary | ICD-10-CM

## 2021-04-07 ENCOUNTER — Other Ambulatory Visit: Payer: Self-pay

## 2021-04-07 ENCOUNTER — Ambulatory Visit (INDEPENDENT_AMBULATORY_CARE_PROVIDER_SITE_OTHER): Payer: Medicare Other | Admitting: Surgery

## 2021-04-07 ENCOUNTER — Encounter: Payer: Self-pay | Admitting: Surgery

## 2021-04-07 ENCOUNTER — Ambulatory Visit (HOSPITAL_COMMUNITY)
Admission: RE | Admit: 2021-04-07 | Discharge: 2021-04-07 | Disposition: A | Discharge status: Home / Self Care | Payer: Medicare Other | Source: Ambulatory Visit | Attending: Surgery | Admitting: Surgery

## 2021-04-07 VITALS — BP 139/77 | HR 79 | Temp 98.1°F | Resp 18 | Ht 72.0 in | Wt 228.0 lb

## 2021-04-07 DIAGNOSIS — I739 Peripheral vascular disease, unspecified: Secondary | ICD-10-CM | POA: Insufficient documentation

## 2021-04-07 DIAGNOSIS — I70211 Atherosclerosis of native arteries of extremities with intermittent claudication, right leg: Secondary | ICD-10-CM

## 2021-04-07 NOTE — Progress Notes (Signed)
[facility] [place]  Patient name: Michael [name] MRN: [id number] DOB: March 14, 1950 Sex: male   REASON FOR VISIT:    Follow up  HISOTRY OF PRESENT ILLNESS:    Michael [name] is a 71 y.o. male who returns today for follow-up of his right leg claudication.  He has recently undergone balloon angioplasty of the right superficial femoral artery for high-grade stenosis.  Unfortunately, he had a early occlusion.  He continues to have symptoms of short distance claudication.  He has not with ulcers.  He suffers from diabetes and hypertension.  He is on a statin for hypercholesterolemia.   PAST MEDICAL HISTORY:   Past Medical History:  Diagnosis Date   Allergy    Anxiety    Diabetes mellitus    Hyperlipidemia    Hypertension    PAD (peripheral artery disease) ([place])    a. s/p prior LE stenting 2012, 03/2017 - followed by [facility].   Prostate cancer ([place])    Sleep apnea 2016   wears CPAP sometimes     FAMILY HISTORY:   Family History  Problem Relation Age of Onset   Hyperlipidemia Mother    Hypertension Mother    Heart disease Father    Deep vein thrombosis Father    Hyperlipidemia Father    Hypertension Father    Heart attack Father    Peripheral vascular disease Father    Cancer Sister     SOCIAL HISTORY:   Social History   Tobacco Use   Smoking status: Never   Smokeless tobacco: Former    Quit date: 10/26/1981  Substance Use Topics   Alcohol use: Yes    Comment: occasional drink, maybe a glass of wine once every 6 months     ALLERGIES:   Allergies  Allergen Reactions   Demerol Anaphylaxis   Lactose Intolerance (Gi) Anaphylaxis   Oxycodone Anxiety and Other (See Comments)    Suicidal ideation   Penicillins Anaphylaxis and Other (See Comments)   Actos [Pioglitazone] Nausea Only   Exenatide Nausea Only and Other (See Comments)   Fentanyl Other (See Comments)    Jittery    Glimepiride Other (See  Comments)    Edema in ankles    Nabumetone Nausea Only and Other (See Comments)   Victoza [Liraglutide] Nausea Only    Shaking   Hydrocodone Anxiety    Suicidal ideation     CURRENT MEDICATIONS:   Current Outpatient Medications  Medication Sig Dispense Refill   ACCU-CHEK AVIVA PLUS test strip AS DIRECTED SEVEN TIMES A DAY IN VITRO 90 DAYS  4   amLODipine (NORVASC) 10 MG tablet Take 10 mg by mouth daily.     atorvastatin (LIPITOR) 40 MG tablet Take 40 mg by mouth at bedtime.  5   benazepril (LOTENSIN) 40 MG tablet Take 40 mg by mouth daily.     clonazePAM (KLONOPIN) 0.5 MG tablet Take 1 mg by mouth at bedtime as needed (Sleep).  5   clonazePAM (KLONOPIN) 1 MG tablet Take 1 mg by mouth daily.     clopidogrel (PLAVIX) 75 MG tablet TAKE 1 TABLET BY MOUTH EVERY DAY WITH BREAKFAST 90 tablet 3   Continuous Blood Gluc Receiver (FREESTYLE LIBRE 14 DAY READER) DEVI use to monitor blood sugar     dapagliflozin propanediol (FARXIGA) 5 MG TABS tablet 10 mg daily.     famotidine (PEPCID) 20 MG tablet Take 20 mg by mouth 2 (two) times daily.     fluticasone (FLONASE) 50  MCG/ACT nasal spray Place 2 sprays into both nostrils at bedtime as needed for allergies (before uses CPAP if needed).     folic acid (FOLVITE) 1 MG tablet Take 1 mg by mouth daily.     HUMALOG KWIKPEN 100 UNIT/ML KwikPen 40 Units 2 (two) times daily.     hydrochlorothiazide (HYDRODIURIL) 25 MG tablet Take 1 tablet (25 mg total) by mouth daily. Please make overdue appt with Dr. Irish Lack before anymore refills. 2nd attempt 15 tablet 0   ibuprofen (ADVIL,MOTRIN) 200 MG tablet Take 800 mg by mouth daily as needed for headache.     insulin aspart (NOVOLOG FLEXPEN) 100 UNIT/ML FlexPen Novolog FlexPen U-100 Insulin aspart 100 unit/mL (3 mL) subcutaneous  USE 18 UNITS IF SUGAR 88-126. THEN 1 UNIT FOR EVERY 40 MG/DL ABOVE GOAL     insulin glargine (LANTUS) 100 UNIT/ML injection Inject 15 Units into the skin at bedtime. 2 hour before bed      metFORMIN (GLUCOPHAGE-XR) 500 MG 24 hr tablet 2,000 mg daily with supper.     methotrexate 2.5 MG tablet Take 15 mg by mouth once a week. Tuesday     metoprolol succinate (TOPROL-XL) 100 MG 24 hr tablet Take 100 mg by mouth daily.      solifenacin (VESICARE) 5 MG tablet Take 5 mg by mouth daily.     tamsulosin (FLOMAX) 0.4 MG CAPS capsule 0.4 mg at bedtime.  5   TRULICITY 1.47 WG/9.5AO SOPN Inject 0.75 mg into the skin once a week.     diclofenac Sodium (VOLTAREN) 1 % GEL Apply 2 g topically daily as needed (pain). (Patient not taking: Reported on 04/07/2021)     No current facility-administered medications for this visit.    REVIEW OF SYSTEMS:   [X]  denotes positive finding, [ ]  denotes negative finding Cardiac  Comments:  Chest pain or chest pressure:    Shortness of breath upon exertion:    Short of breath when lying flat:    Irregular heart rhythm:        Vascular    Pain in calf, thigh, or hip brought on by ambulation: x   Pain in feet at night that wakes you up from your sleep:     Blood clot in your veins:    Leg swelling:         Pulmonary    Oxygen at home:    Productive cough:     Wheezing:         Neurologic    Sudden weakness in arms or legs:     Sudden numbness in arms or legs:     Sudden onset of difficulty speaking or slurred speech:    Temporary loss of vision in one eye:     Problems with dizziness:         Gastrointestinal    Blood in stool:     Vomited blood:         Genitourinary    Burning when urinating:     Blood in urine:        Psychiatric    Major depression:         Hematologic    Bleeding problems:    Problems with blood clotting too easily:        Skin    Rashes or ulcers:        Constitutional    Fever or chills:      PHYSICAL EXAM:   Vitals:   04/07/21 1220  BP: 139/77  Pulse: 79  Resp: 18  Temp: 98.1 F (36.7 C)  TempSrc: Temporal  SpO2: 98%  Weight: 103.4 kg  Height: 6' (1.829 m)    GENERAL: The patient is a  well-nourished male, in no acute distress. The vital signs are documented above. CARDIAC: There is a regular rate and rhythm.  VASCULAR: Nonpalpable pedal pulse on the right PULMONARY: Non-labored respirations ABDOMEN: Soft and non-tender with normal pitched bowel sounds.  MUSCULOSKELETAL: There are no major deformities or cyanosis. NEUROLOGIC: No focal weakness or paresthesias are detected. SKIN: There are no ulcers or rashes noted. PSYCHIATRIC: The patient has a normal affect.  STUDIES:   I have reviewed the following studies: Vein mapping:       (cm)                                               (cm)                      +---------------+-----------+----------------------+---------------+-------  ----+        0.60                       Saphenofemoral          0.60                                                          Junction                                       +---------------+-----------+----------------------+---------------+-------  ----+        0.34                       Proximal thigh          0.38                      +---------------+-----------+----------------------+---------------+-------  ----+        0.28        branching        Mid thigh             0.28         branching    +---------------+-----------+----------------------+---------------+-------  ----+        0.23                        Distal thigh           0.30                      +---------------+-----------+----------------------+---------------+-------  ----+        0.26                            Knee               0.30                      +---------------+-----------+----------------------+---------------+-------  ----+  0.20                         Prox calf             0.26                      +---------------+-----------+----------------------+---------------+-------  ----+        0.18                          Mid calf             0.27                       +---------------+-----------+----------------------+---------------+-------  ----+        0.28                        Distal calf            0.28                      +---------------+-----------+----------------------+---------------+-------  ----+   +----------------+-----------+---------------+----------------+-----------+    RT diameter (cm) RT Findings       SSV       LT Diameter (cm) LT  Findings   +----------------+-----------+---------------+----------------+-----------+          0.36                   Popliteal fossa       0.50                      +----------------+-----------+---------------+----------------+-----------+          0.37                    Proximal calf        0.37                      +----------------+-----------+---------------+----------------+-----------+          0.36                      Mid calf           0.30                      +----------------+-----------+---------------+----------------+-----------+          0.32                     Distal calf         0.30                      +----------------+-----------+---------------+----------------+-----------+   MEDICAL ISSUES:   Severe right leg claudication: The patient has had purplish discoloration of his foot, sensation loss, and short distance claudication.  His superficial femoral artery intervention has occluded as documented by ultrasound.  We discussed proceeding with a right femoral to above-knee popliteal artery bypass graft with vein next week.  He is going to stop his Plavix.  All questions were answered.  He wishes to proceed.    Leia Alf, MD, FACS [facility] [phone] Pager [phone]

## 2021-04-07 NOTE — H&P (View-Only) (Signed)
[facility] [place]  Patient name: Michael [name] MRN: [id number] DOB: [birth year] Sex: male   REASON FOR VISIT:    Follow up  HISOTRY OF PRESENT ILLNESS:    Michael [name] is a 71 y.o. male who returns today for follow-up of his right leg claudication.  He has recently undergone balloon angioplasty of the right superficial femoral artery for high-grade stenosis.  Unfortunately, he had a early occlusion.  He continues to have symptoms of short distance claudication.  He has not with ulcers.  He suffers from diabetes and hypertension.  He is on a statin for hypercholesterolemia.   PAST MEDICAL HISTORY:   Past Medical History:  Diagnosis Date   Allergy    Anxiety    Diabetes mellitus    Hyperlipidemia    Hypertension    PAD (peripheral artery disease) ([place])    a. s/p prior LE stenting 2012, 03/2017 - followed by [facility].   Prostate cancer ([place])    Sleep apnea 2016   wears CPAP sometimes     FAMILY HISTORY:   Family History  Problem Relation Age of Onset   Hyperlipidemia Mother    Hypertension Mother    Heart disease Father    Deep vein thrombosis Father    Hyperlipidemia Father    Hypertension Father    Heart attack Father    Peripheral vascular disease Father    Cancer Sister     SOCIAL HISTORY:   Social History   Tobacco Use   Smoking status: Never   Smokeless tobacco: Former    Quit date: 10/26/1981  Substance Use Topics   Alcohol use: Yes    Comment: occasional drink, maybe a glass of wine once every 6 months     ALLERGIES:   Allergies  Allergen Reactions   Demerol Anaphylaxis   Lactose Intolerance (Gi) Anaphylaxis   Oxycodone Anxiety and Other (See Comments)    Suicidal ideation   Penicillins Anaphylaxis and Other (See Comments)   Actos [Pioglitazone] Nausea Only   Exenatide Nausea Only and Other (See Comments)   Fentanyl Other (See Comments)    Jittery    Glimepiride Other (See  Comments)    Edema in ankles    Nabumetone Nausea Only and Other (See Comments)   Victoza [Liraglutide] Nausea Only    Shaking   Hydrocodone Anxiety    Suicidal ideation     CURRENT MEDICATIONS:   Current Outpatient Medications  Medication Sig Dispense Refill   ACCU-CHEK AVIVA PLUS test strip AS DIRECTED SEVEN TIMES A DAY IN VITRO 90 DAYS  4   amLODipine (NORVASC) 10 MG tablet Take 10 mg by mouth daily.     atorvastatin (LIPITOR) 40 MG tablet Take 40 mg by mouth at bedtime.  5   benazepril (LOTENSIN) 40 MG tablet Take 40 mg by mouth daily.     clonazePAM (KLONOPIN) 0.5 MG tablet Take 1 mg by mouth at bedtime as needed (Sleep).  5   clonazePAM (KLONOPIN) 1 MG tablet Take 1 mg by mouth daily.     clopidogrel (PLAVIX) 75 MG tablet TAKE 1 TABLET BY MOUTH EVERY DAY WITH BREAKFAST 90 tablet 3   Continuous Blood Gluc Receiver (FREESTYLE LIBRE 14 DAY READER) DEVI use to monitor blood sugar     dapagliflozin propanediol (FARXIGA) 5 MG TABS tablet 10 mg daily.     famotidine (PEPCID) 20 MG tablet Take 20 mg by mouth 2 (two) times daily.     fluticasone (FLONASE) 50  MCG/ACT nasal spray Place 2 sprays into both nostrils at bedtime as needed for allergies (before uses CPAP if needed).     folic acid (FOLVITE) 1 MG tablet Take 1 mg by mouth daily.     HUMALOG KWIKPEN 100 UNIT/ML KwikPen 40 Units 2 (two) times daily.     hydrochlorothiazide (HYDRODIURIL) 25 MG tablet Take 1 tablet (25 mg total) by mouth daily. Please make overdue appt with Dr. Irish Lack before anymore refills. 2nd attempt 15 tablet 0   ibuprofen (ADVIL,MOTRIN) 200 MG tablet Take 800 mg by mouth daily as needed for headache.     insulin aspart (NOVOLOG FLEXPEN) 100 UNIT/ML FlexPen Novolog FlexPen U-100 Insulin aspart 100 unit/mL (3 mL) subcutaneous  USE 18 UNITS IF SUGAR 88-126. THEN 1 UNIT FOR EVERY 40 MG/DL ABOVE GOAL     insulin glargine (LANTUS) 100 UNIT/ML injection Inject 15 Units into the skin at bedtime. 2 hour before bed      metFORMIN (GLUCOPHAGE-XR) 500 MG 24 hr tablet 2,000 mg daily with supper.     methotrexate 2.5 MG tablet Take 15 mg by mouth once a week. Tuesday     metoprolol succinate (TOPROL-XL) 100 MG 24 hr tablet Take 100 mg by mouth daily.      solifenacin (VESICARE) 5 MG tablet Take 5 mg by mouth daily.     tamsulosin (FLOMAX) 0.4 MG CAPS capsule 0.4 mg at bedtime.  5   TRULICITY 3.22 GU/5.4YH SOPN Inject 0.75 mg into the skin once a week.     diclofenac Sodium (VOLTAREN) 1 % GEL Apply 2 g topically daily as needed (pain). (Patient not taking: Reported on 04/07/2021)     No current facility-administered medications for this visit.    REVIEW OF SYSTEMS:   [X]  denotes positive finding, [ ]  denotes negative finding Cardiac  Comments:  Chest pain or chest pressure:    Shortness of breath upon exertion:    Short of breath when lying flat:    Irregular heart rhythm:        Vascular    Pain in calf, thigh, or hip brought on by ambulation: x   Pain in feet at night that wakes you up from your sleep:     Blood clot in your veins:    Leg swelling:         Pulmonary    Oxygen at home:    Productive cough:     Wheezing:         Neurologic    Sudden weakness in arms or legs:     Sudden numbness in arms or legs:     Sudden onset of difficulty speaking or slurred speech:    Temporary loss of vision in one eye:     Problems with dizziness:         Gastrointestinal    Blood in stool:     Vomited blood:         Genitourinary    Burning when urinating:     Blood in urine:        Psychiatric    Major depression:         Hematologic    Bleeding problems:    Problems with blood clotting too easily:        Skin    Rashes or ulcers:        Constitutional    Fever or chills:      PHYSICAL EXAM:   Vitals:   04/07/21 1220  BP: 139/77  Pulse: 79  Resp: 18  Temp: 98.1 F (36.7 C)  TempSrc: Temporal  SpO2: 98%  Weight: 103.4 kg  Height: 6' (1.829 m)    GENERAL: The patient is a  well-nourished male, in no acute distress. The vital signs are documented above. CARDIAC: There is a regular rate and rhythm.  VASCULAR: Nonpalpable pedal pulse on the right PULMONARY: Non-labored respirations ABDOMEN: Soft and non-tender with normal pitched bowel sounds.  MUSCULOSKELETAL: There are no major deformities or cyanosis. NEUROLOGIC: No focal weakness or paresthesias are detected. SKIN: There are no ulcers or rashes noted. PSYCHIATRIC: The patient has a normal affect.  STUDIES:   I have reviewed the following studies: Vein mapping:       (cm)                                               (cm)                      +---------------+-----------+----------------------+---------------+-------  ----+        0.60                       Saphenofemoral          0.60                                                          Junction                                       +---------------+-----------+----------------------+---------------+-------  ----+        0.34                       Proximal thigh          0.38                      +---------------+-----------+----------------------+---------------+-------  ----+        0.28        branching        Mid thigh             0.28         branching    +---------------+-----------+----------------------+---------------+-------  ----+        0.23                        Distal thigh           0.30                      +---------------+-----------+----------------------+---------------+-------  ----+        0.26                            Knee               0.30                      +---------------+-----------+----------------------+---------------+-------  ----+  0.20                         Prox calf             0.26                      +---------------+-----------+----------------------+---------------+-------  ----+        0.18                          Mid calf             0.27                       +---------------+-----------+----------------------+---------------+-------  ----+        0.28                        Distal calf            0.28                      +---------------+-----------+----------------------+---------------+-------  ----+   +----------------+-----------+---------------+----------------+-----------+    RT diameter (cm) RT Findings       SSV       LT Diameter (cm) LT  Findings   +----------------+-----------+---------------+----------------+-----------+          0.36                   Popliteal fossa       0.50                      +----------------+-----------+---------------+----------------+-----------+          0.37                    Proximal calf        0.37                      +----------------+-----------+---------------+----------------+-----------+          0.36                      Mid calf           0.30                      +----------------+-----------+---------------+----------------+-----------+          0.32                     Distal calf         0.30                      +----------------+-----------+---------------+----------------+-----------+   MEDICAL ISSUES:   Severe right leg claudication: The patient has had purplish discoloration of his foot, sensation loss, and short distance claudication.  His superficial femoral artery intervention has occluded as documented by ultrasound.  We discussed proceeding with a right femoral to above-knee popliteal artery bypass graft with vein next week.  He is going to stop his Plavix.  All questions were answered.  He wishes to proceed.    Leia Alf, MD, FACS [facility] [phone] Pager [phone]

## 2021-04-11 NOTE — Pre-Procedure Instructions (Signed)
Surgical Instructions    Your procedure is scheduled on Wednesday, April 16, 2021 at 8:30 AM.  Report to [facility] Main Entrance "A" at 6:30 A.M., then check in with the Admitting office.  Call this number if you have problems the morning of surgery:  [phone]   If you have any questions prior to your surgery date call [phone]: Open Monday-Friday 8am-4pm    Remember:  Do not eat or drink after midnight the night before your surgery    Take these medicines the morning of surgery with A SIP OF WATER:  amLODipine (NORVASC) famotidine (PEPCID) metoprolol succinate (TOPROL-XL) solifenacin (VESICARE) fluticasone (FLONASE) - if needed  Follow your surgeon's instructions on when to stop clopidogrel (PLAVIX).  If no instructions were given by your surgeon then you will need to call the office to get those instructions.     As of today, STOP taking any Aspirin (unless otherwise instructed by your surgeon) naproxen sodium (ALEVE), ibuprofen (ADVIL,MOTRIN), Goody's, BC's, all herbal medications, fish oil, and all vitamins.         WHAT DO I DO ABOUT MY DIABETES MEDICATION?   Do not take dapagliflozin propanediol (FARXIGA) the day before (2/14) or the morning of surgery (2/15).  Do not take your HUMALOG [facility] the evening before surgery (2/14) or the morning of surgery (2/15).  THE NIGHT BEFORE SURGERY, take 8 units of insulin glargine (LANTUS).      The day of surgery, do not take any diabetes injectables, including Trulicity (dulaglutide).  If your CBG is greater than 220 mg/dL, you may take 17 units of your HUMALOG KWIKPEN.   HOW TO MANAGE YOUR DIABETES BEFORE AND AFTER SURGERY  Why is it important to control my blood sugar before and after surgery? Improving blood sugar levels before and after surgery helps healing and can limit problems. A way of improving blood sugar control is eating a healthy diet by:  Eating less sugar and carbohydrates  Increasing  activity/exercise  Talking with your doctor about reaching your blood sugar goals High blood sugars (greater than 180 mg/dL) can raise your risk of infections and slow your recovery, so you will need to focus on controlling your diabetes during the weeks before surgery. Make sure that the doctor who takes care of your diabetes knows about your planned surgery including the date and location.  How do I manage my blood sugar before surgery? Check your blood sugar at least 4 times a day, starting 2 days before surgery, to make sure that the level is not too high or low.  Check your blood sugar the morning of your surgery when you wake up and every 2 hours until you get to the Short Stay unit.  If your blood sugar is less than 70 mg/dL, you will need to treat for low blood sugar: Do not take insulin. Treat a low blood sugar (less than 70 mg/dL) with  cup of clear juice (cranberry or apple), 4 glucose tablets, OR glucose gel. Recheck blood sugar in 15 minutes after treatment (to make sure it is greater than 70 mg/dL). If your blood sugar is not greater than 70 mg/dL on recheck, call [phone] for further instructions. Report your blood sugar to the short stay nurse when you get to Short Stay.  If you are admitted to the hospital after surgery: Your blood sugar will be checked by the staff and you will probably be given insulin after surgery (instead of oral diabetes medicines) to make sure you have  good blood sugar levels. The goal for blood sugar control after surgery is 80-180 mg/dL.             Do NOT Smoke (Tobacco/Vaping) for 24 hours prior to your procedure.  If you use a CPAP at night, you may bring your mask/headgear for your overnight stay.   Contacts, glasses, piercing's, hearing aid's, dentures or partials may not be worn into surgery, please bring cases for these belongings.    For patients admitted to the hospital, discharge time will be determined by your treatment team.    Patients discharged the day of surgery will not be allowed to drive home, and someone needs to stay with them for 24 hours.  NO VISITORS WILL BE ALLOWED IN PRE-OP WHERE PATIENTS ARE PREPPED FOR SURGERY.  ONLY 1 SUPPORT PERSON MAY BE PRESENT IN THE WAITING ROOM WHILE YOU ARE IN SURGERY.  IF YOU ARE TO BE ADMITTED, ONCE YOU ARE IN YOUR ROOM YOU WILL BE ALLOWED TWO (2) VISITORS. (1) VISITOR MAY STAY OVERNIGHT BUT MUST ARRIVE TO THE ROOM BY 8pm.  Minor children may have two parents present. Special consideration for safety and communication needs will be reviewed on a case by case basis.   Special instructions:   [place]- Preparing For Surgery  Before surgery, you can play an important role. Because skin is not sterile, your skin needs to be as free of germs as possible. You can reduce the number of germs on your skin by washing with CHG (chlorahexidine gluconate) Soap before surgery.  CHG is an antiseptic cleaner which kills germs and bonds with the skin to continue killing germs even after washing.    Oral Hygiene is also important to reduce your risk of infection.  Remember - BRUSH YOUR TEETH THE MORNING OF SURGERY WITH YOUR REGULAR TOOTHPASTE  Please do not use if you have an allergy to CHG or antibacterial soaps. If your skin becomes reddened/irritated stop using the CHG.  Do not shave (including legs and underarms) for at least 48 hours prior to first CHG shower. It is OK to shave your face.  Please follow these instructions carefully.   Shower the NIGHT BEFORE SURGERY and the MORNING OF SURGERY  If you chose to wash your hair, wash your hair first as usual with your normal shampoo.  After you shampoo, rinse your hair and body thoroughly to remove the shampoo.  Use CHG Soap as you would any other liquid soap. You can apply CHG directly to the skin and wash gently with a scrungie or a clean washcloth.   Apply the CHG Soap to your body ONLY FROM THE NECK DOWN.  Do not use on open  wounds or open sores. Avoid contact with your eyes, ears, mouth and genitals (private parts). Wash Face and genitals (private parts)  with your normal soap.   Wash thoroughly, paying special attention to the area where your surgery will be performed.  Thoroughly rinse your body with warm water from the neck down.  DO NOT shower/wash with your normal soap after using and rinsing off the CHG Soap.  Pat yourself dry with a CLEAN TOWEL.  Wear CLEAN PAJAMAS to bed the night before surgery  Place CLEAN SHEETS on your bed the night before your surgery  DO NOT SLEEP WITH PETS.   Day of Surgery: Shower with CHG soap. Do not wear jewelry. Do not wear lotions, powders, colognes, or deodorant. Do not shave 48 hours prior to surgery.  Men may  shave face and neck. Do not bring valuables to the hospital. [facility] is not responsible for any belongings or valuables. Wear Clean/Comfortable clothing the morning of surgery Remember to brush your teeth WITH YOUR REGULAR TOOTHPASTE.   Please read over the following fact sheets that you were given.   3 days prior to your procedure or After your COVID test   You are not required to quarantine however you are required to wear a well-fitting mask when you are out and around people not in your household. If your mask becomes wet or soiled, replace with a new one.   Wash your hands often with soap and water for 20 seconds or clean your hands with an alcohol-based hand sanitizer that contains at least 60% alcohol.   Do not share personal items.   Notify your provider:  o if you are in close contact with someone who has COVID  o or if you develop a fever of 100.4 or greater, sneezing, cough, sore throat, shortness of breath or body aches.

## 2021-04-14 ENCOUNTER — Encounter (HOSPITAL_COMMUNITY): Payer: Self-pay

## 2021-04-14 ENCOUNTER — Other Ambulatory Visit: Payer: Self-pay

## 2021-04-14 ENCOUNTER — Encounter (HOSPITAL_COMMUNITY)
Admission: RE | Admit: 2021-04-14 | Discharge: 2021-04-14 | Disposition: A | Discharge status: Home / Self Care | Payer: Medicare Other | Source: Ambulatory Visit | Attending: Surgery | Admitting: Surgery

## 2021-04-14 VITALS — BP 124/73 | HR 92 | Temp 97.7°F | Resp 18 | Ht 71.0 in | Wt 232.2 lb

## 2021-04-14 DIAGNOSIS — Z20822 Contact with and (suspected) exposure to covid-19: Secondary | ICD-10-CM | POA: Diagnosis not present

## 2021-04-14 DIAGNOSIS — I70211 Atherosclerosis of native arteries of extremities with intermittent claudication, right leg: Secondary | ICD-10-CM | POA: Diagnosis not present

## 2021-04-14 DIAGNOSIS — L851 Acquired keratosis [keratoderma] palmaris et plantaris: Secondary | ICD-10-CM | POA: Diagnosis not present

## 2021-04-14 DIAGNOSIS — Z7984 Long term (current) use of oral hypoglycemic drugs: Secondary | ICD-10-CM | POA: Diagnosis not present

## 2021-04-14 DIAGNOSIS — Z01818 Encounter for other preprocedural examination: Secondary | ICD-10-CM

## 2021-04-14 DIAGNOSIS — Z95820 Peripheral vascular angioplasty status with implants and grafts: Secondary | ICD-10-CM | POA: Insufficient documentation

## 2021-04-14 DIAGNOSIS — Z8249 Family history of ischemic heart disease and other diseases of the circulatory system: Secondary | ICD-10-CM | POA: Diagnosis not present

## 2021-04-14 DIAGNOSIS — Z83438 Family history of other disorder of lipoprotein metabolism and other lipidemia: Secondary | ICD-10-CM | POA: Diagnosis not present

## 2021-04-14 DIAGNOSIS — Z79899 Other long term (current) drug therapy: Secondary | ICD-10-CM | POA: Diagnosis not present

## 2021-04-14 DIAGNOSIS — E669 Obesity, unspecified: Secondary | ICD-10-CM | POA: Diagnosis not present

## 2021-04-14 DIAGNOSIS — E7800 Pure hypercholesterolemia, unspecified: Secondary | ICD-10-CM | POA: Diagnosis not present

## 2021-04-14 DIAGNOSIS — Z01812 Encounter for preprocedural laboratory examination: Secondary | ICD-10-CM | POA: Insufficient documentation

## 2021-04-14 DIAGNOSIS — I2510 Atherosclerotic heart disease of native coronary artery without angina pectoris: Secondary | ICD-10-CM | POA: Insufficient documentation

## 2021-04-14 DIAGNOSIS — E785 Hyperlipidemia, unspecified: Secondary | ICD-10-CM | POA: Insufficient documentation

## 2021-04-14 DIAGNOSIS — Z885 Allergy status to narcotic agent status: Secondary | ICD-10-CM | POA: Diagnosis not present

## 2021-04-14 DIAGNOSIS — Z8546 Personal history of malignant neoplasm of prostate: Secondary | ICD-10-CM | POA: Diagnosis not present

## 2021-04-14 DIAGNOSIS — Z7901 Long term (current) use of anticoagulants: Secondary | ICD-10-CM | POA: Insufficient documentation

## 2021-04-14 DIAGNOSIS — G4733 Obstructive sleep apnea (adult) (pediatric): Secondary | ICD-10-CM | POA: Insufficient documentation

## 2021-04-14 DIAGNOSIS — Z794 Long term (current) use of insulin: Secondary | ICD-10-CM

## 2021-04-14 DIAGNOSIS — Z6830 Body mass index (BMI) 30.0-30.9, adult: Secondary | ICD-10-CM | POA: Diagnosis not present

## 2021-04-14 DIAGNOSIS — Z7902 Long term (current) use of antithrombotics/antiplatelets: Secondary | ICD-10-CM | POA: Diagnosis not present

## 2021-04-14 DIAGNOSIS — Z923 Personal history of irradiation: Secondary | ICD-10-CM | POA: Insufficient documentation

## 2021-04-14 DIAGNOSIS — Z880 Allergy status to penicillin: Secondary | ICD-10-CM | POA: Diagnosis not present

## 2021-04-14 DIAGNOSIS — I10 Essential (primary) hypertension: Secondary | ICD-10-CM | POA: Diagnosis not present

## 2021-04-14 DIAGNOSIS — Z809 Family history of malignant neoplasm, unspecified: Secondary | ICD-10-CM | POA: Diagnosis not present

## 2021-04-14 DIAGNOSIS — E1151 Type 2 diabetes mellitus with diabetic peripheral angiopathy without gangrene: Secondary | ICD-10-CM | POA: Diagnosis not present

## 2021-04-14 DIAGNOSIS — Z6832 Body mass index (BMI) 32.0-32.9, adult: Secondary | ICD-10-CM | POA: Insufficient documentation

## 2021-04-14 DIAGNOSIS — E119 Type 2 diabetes mellitus without complications: Secondary | ICD-10-CM

## 2021-04-14 DIAGNOSIS — B351 Tinea unguium: Secondary | ICD-10-CM | POA: Diagnosis not present

## 2021-04-14 DIAGNOSIS — Z87891 Personal history of nicotine dependence: Secondary | ICD-10-CM | POA: Diagnosis not present

## 2021-04-14 DIAGNOSIS — E739 Lactose intolerance, unspecified: Secondary | ICD-10-CM | POA: Diagnosis not present

## 2021-04-14 DIAGNOSIS — K219 Gastro-esophageal reflux disease without esophagitis: Secondary | ICD-10-CM | POA: Insufficient documentation

## 2021-04-14 HISTORY — DX: Gastro-esophageal reflux disease without esophagitis: K21.9

## 2021-04-14 LAB — COMPREHENSIVE METABOLIC PANEL
ALT: 24 U/L (ref 0–44)
AST: 19 U/L (ref 15–41)
Albumin: 4 g/dL (ref 3.5–5.0)
Alkaline Phosphatase: 38 U/L (ref 38–126)
Anion gap: 13 (ref 5–15)
BUN: 27 mg/dL — ABNORMAL HIGH (ref 8–23)
CO2: 22 mmol/L (ref 22–32)
Calcium: 8.9 mg/dL (ref 8.9–10.3)
Chloride: 104 mmol/L (ref 98–111)
Creatinine, Ser: 1.18 mg/dL (ref 0.61–1.24)
GFR, Estimated: >60 mL/min (ref >60)
Glucose, Bld: 263 mg/dL — ABNORMAL HIGH (ref 70–99)
Potassium: 4 mmol/L (ref 3.5–5.1)
Sodium: 139 mmol/L (ref 135–145)
Total Bilirubin: 0.8 mg/dL (ref 0.3–1.2)
Total Protein: 6.8 g/dL (ref 6.5–8.1)

## 2021-04-14 LAB — CBC
HCT: 44.1 % (ref 39.0–52.0)
Hemoglobin: 15.1 g/dL (ref 13.0–17.0)
MCH: 34.6 pg — ABNORMAL HIGH (ref 26.0–34.0)
MCHC: 34.2 g/dL (ref 30.0–36.0)
MCV: 101.1 fL — ABNORMAL HIGH (ref 80.0–100.0)
Platelets: 173 10*3/uL (ref 150–400)
RBC: 4.36 MIL/uL (ref 4.22–5.81)
RDW: 13.3 % (ref 11.5–15.5)
WBC: 5.6 10*3/uL (ref 4.0–10.5)
nRBC: 0 % (ref 0.0–0.2)

## 2021-04-14 LAB — URINALYSIS, ROUTINE W REFLEX MICROSCOPIC
Bacteria, UA: NONE SEEN
Bilirubin Urine: NEGATIVE
Glucose, UA: >=500 mg/dL — AB
Hgb urine dipstick: NEGATIVE
Ketones, ur: 5 mg/dL — AB
Leukocytes,Ua: NEGATIVE
Nitrite: NEGATIVE
Protein, ur: NEGATIVE mg/dL
Specific Gravity, Urine: 1.027 (ref 1.005–1.030)
pH: 5 (ref 5.0–8.0)

## 2021-04-14 LAB — SARS CORONAVIRUS 2 (TAT 6-24 HRS): SARS Coronavirus 2: NEGATIVE

## 2021-04-14 LAB — TYPE AND SCREEN
ABO/RH(D): A POS
Antibody Screen: NEGATIVE

## 2021-04-14 LAB — HEMOGLOBIN A1C
Hgb A1c MFr Bld: 8.3 % — ABNORMAL HIGH (ref 4.8–5.6)
Mean Plasma Glucose: 191.51 mg/dL

## 2021-04-14 LAB — SURGICAL PCR SCREEN
MRSA, PCR: NEGATIVE
Staphylococcus aureus: NEGATIVE

## 2021-04-14 LAB — APTT: aPTT: 23 seconds — ABNORMAL LOW (ref 24–36)

## 2021-04-14 LAB — PROTIME-INR
INR: 0.9 (ref 0.8–1.2)
Prothrombin Time: 12 seconds (ref 11.4–15.2)

## 2021-04-14 LAB — GLUCOSE, CAPILLARY: Glucose-Capillary: 290 mg/dL — ABNORMAL HIGH (ref 70–99)

## 2021-04-14 NOTE — Progress Notes (Addendum)
PCP: Rolene Course, PA-C Cardiologist: Larae Grooms, MD  EKG: 05/29/20 CXR: na ECHO: 11/18/17 Stress Test: 01/23/13 Cardiac Cath:denies  Fasting Blood Sugar- 108-110 Checks Blood Sugar__4_ times a day Wears Flex glucose monitor BG 290 at PAT.  Diabetes Coordinator aware of glucose and surgery.   ASA: No Blood Thinner: Yes, last dose Plavix 04/11/21  OSA: Yes CPAP: Patient states does not wear  Covid test 04/14/21 at PAT  Anesthesia Review: Yes, Plavix, BG 290.  A1C 8.3  Patient denies shortness of breath, fever, cough, and chest pain at PAT appointment.  Patient verbalized understanding of instructions provided today at the PAT appointment.  Patient asked to review instructions at home and day of surgery.

## 2021-04-15 MED ORDER — VANCOMYCIN HCL 1500 MG/300ML IV SOLN
1500.0000 mg | INTRAVENOUS | Status: AC
  Administered 2021-04-16: 1500 mg via INTRAVENOUS
  Filled 2021-04-15: qty 300

## 2021-04-15 NOTE — Progress Notes (Signed)
Anesthesia Chart Review:  Case: [id number] Date/Time: 04/16/21 0815   Procedure: RIGHT FEMORAL-POPLITEAL BYPASS (Right)   Anesthesia type: General   Pre-op diagnosis: Atherosclerosis of native artery of right lower extremity with intermittent claudication   Location: [facility] OR ROOM 11 / [place] [facility]   Surgeons: Serafina Mitchell, MD       DISCUSSION: Patient is a 71 year old male scheduled for the above procedure.  History includes never smoker, HTN, DM2, HLD, PAD (right SFA/popliteal artery stent 11/05/10; atherectomy/angioplasty right SFA 09/01/16, 03/16/17 & 01/30/21 with early occlusion), OSA (inconsistent CPAP use), prostate cancer (01/12/18, s/p radiation therapy 04/11/18-05/18/18), GERD.  BMI is consistent with obesity.  Last seen by cardiologist Dr. Irish Lack 05/29/2020, and at that time described some DOE and atypical chest pain. Coronary CTA was ordered.  Scan showed mild nonobstructive CAD. Recommendation was to consider nonatherosclerotic causes of chest pain.   Has DM2. Regimen includes insulin. He uses a OneTouch Verip "Flex" glucose monitor. A1c 8.3%, down from 9.0% on 03/18/21 [facility]).   Per [facility], hold Plavix for 5 days, last dose reported as 04/11/21.   04/14/2021 presurgical COVID-19 test negative.  Anesthesia team to evaluate on the day of surgery.   VS: BP 124/73    Pulse 92    Temp 36.5 C (Oral)    Resp 18    Ht 5\' 11"  (1.803 m)    Wt 105.3 kg    SpO2 100%    BMI 32.39 kg/m    PROVIDERS: Rolene Course, PA-C is PCP  Larae Grooms, MD is cardiologist Delrae Rend, MD is endocrinologist Baruch Gouty, MD is urologist Tyler Pita, MD is RAD-ON[facility] Gavin Pound, MD is rheumatologist   LABS: Preoperative labs noted.  See DISCUSSION. (all labs ordered are listed, but only abnormal results are displayed)  Labs Reviewed  GLUCOSE, CAPILLARY - Abnormal; Notable for the following components:      Result Value   Glucose-Capillary 290 (*)    All other components within  normal limits  APTT - Abnormal; Notable for the following components:   aPTT 23 (*)    All other components within normal limits  CBC - Abnormal; Notable for the following components:   MCV 101.1 (*)    MCH 34.6 (*)    All other components within normal limits  COMPREHENSIVE METABOLIC PANEL - Abnormal; Notable for the following components:   Glucose, Bld 263 (*)    BUN 27 (*)    All other components within normal limits  URINALYSIS, ROUTINE W REFLEX MICROSCOPIC - Abnormal; Notable for the following components:   Glucose, UA >=500 (*)    Ketones, ur 5 (*)    All other components within normal limits  HEMOGLOBIN A1C - Abnormal; Notable for the following components:   Hgb A1c MFr Bld 8.3 (*)    All other components within normal limits  SURGICAL PCR SCREEN  SARS CORONAVIRUS 2 (TAT 6-24 HRS)  PROTIME-INR  TYPE AND SCREEN     EKG: 30/30/2022: NSR   CV: Coronary CTA 06/10/2020: IMPRESSION: 1. Coronary calcium score of 318. This was 63rd percentile for age and sex matched controls.   2. Normal coronary origin with right dominance.   3. Mild CAD (25-49%) in the mid LAD and mid LCX.   4. Minimal CAD (<25%) in the RCA).   RECOMMENDATIONS: 1. Mild non-obstructive CAD (25-49%). Consider non-atherosclerotic causes of chest pain. Consider preventive therapy and risk factor modification.    TTE 11/18/17: - Left ventricle: The cavity size was normal.  There was moderate    concentric hypertrophy. Systolic function was vigorous. The    estimated ejection fraction was in the range of 65% to 70%. Wall    motion was normal; there were no regional wall motion    abnormalities. Doppler parameters are consistent with abnormal    left ventricular relaxation (grade 1 diastolic dysfunction).    There was no evidence of elevated ventricular filling pressure by    Doppler parameters.  - Aortic root: The aortic root was normal in size.  - Left atrium: The atrium was normal in size.  - Right  ventricle: Systolic function was normal.  - Right atrium: The atrium was normal in size.  - Tricuspid valve: There was no regurgitation.  - Inferior vena cava: The vessel was normal in size.  - Pericardium, extracardiac: There was no pericardial effusion.     Past Medical History:  Diagnosis Date   Allergy    Anxiety    Diabetes mellitus    GERD (gastroesophageal reflux disease)    Hyperlipidemia    Hypertension    PAD (peripheral artery disease) ([place])    a. s/p prior LE stenting 2012, 03/2017 - followed by [facility].   Prostate cancer ([place])    Sleep apnea 2016   wears CPAP sometimes    Past Surgical History:  Procedure Laterality Date   ABDOMINAL AORTOGRAM N/A 09/01/2016   Procedure: Abdominal Aortogram;  Surgeon: Serafina Mitchell, MD;  Location: [place] [facility];  Service: Cardiovascular;  Laterality: N/A;   ABDOMINAL AORTOGRAM W/LOWER EXTREMITY N/A 03/16/2017   Procedure: ABDOMINAL AORTOGRAM W/LOWER EXTREMITY;  Surgeon: Serafina Mitchell, MD;  Location: [place] [facility];  Service: Cardiovascular;  Laterality: N/A;   ANGIOPLASTY Right 01/30/2021   Procedure: BALLOON ANGIOPLASTY;  Surgeon: Serafina Mitchell, MD;  Location: [facility] OR;  Service: Vascular;  Laterality: Right;   ANGIOPLASTY / STENTING FEMORAL  11/05/2010   Left SFA stent   KNEE ARTHROSCOPY     LOWER EXTREMITY ANGIOGRAM Right 01/30/2021   Procedure: LOWER EXTREMITY ANGIOGRAM WITH ARTERIOGRAM OF RIGHT LOWER EXTREMITY;  Surgeon: Serafina Mitchell, MD;  Location: [place];  Service: Vascular;  Laterality: Right;   LOWER EXTREMITY ANGIOGRAPHY N/A 09/01/2016   Procedure: Lower Extremity Angiography;  Surgeon: Serafina Mitchell, MD;  Location: [place] [facility];  Service: Cardiovascular;  Laterality: N/A;   PERIPHERAL VASCULAR ATHERECTOMY Right 09/01/2016   Procedure: Peripheral Vascular Atherectomy;  Surgeon: Serafina Mitchell, MD;  Location: [place] [facility];  Service: Cardiovascular;  Laterality: Right;  superficial femoral    PERIPHERAL VASCULAR INTERVENTION Right 03/16/2017   Procedure: PERIPHERAL VASCULAR INTERVENTION;  Surgeon: Serafina Mitchell, MD;  Location: [place] [facility];  Service: Cardiovascular;  Laterality: Right;  superficicial femoral   ROTATOR CUFF REPAIR     1990   ROTATOR CUFF REPAIR Bilateral 2017   ULTRASOUND GUIDANCE FOR VASCULAR ACCESS Left 01/30/2021   Procedure: ULTRASOUND GUIDANCE FOR VASCULAR ACCESS;  Surgeon: Serafina Mitchell, MD;  Location: [facility];  Service: Vascular;  Laterality: Left;    MEDICATIONS:  ACCU-CHEK AVIVA PLUS test strip   amLODipine (NORVASC) 10 MG tablet   atorvastatin (LIPITOR) 40 MG tablet   benazepril (LOTENSIN) 40 MG tablet   clonazePAM (KLONOPIN) 1 MG tablet   clopidogrel (PLAVIX) 75 MG tablet   Continuous Blood Gluc Receiver (FREESTYLE LIBRE 14 DAY READER) DEVI   dapagliflozin propanediol (FARXIGA) 5 MG TABS tablet   Dulaglutide 1.5 MG/0.5ML SOPN   famotidine (PEPCID) 20  MG tablet   fluticasone (FLONASE) 50 MCG/ACT nasal spray   folic acid (FOLVITE) 1 MG tablet   HUMALOG KWIKPEN 100 UNIT/ML KwikPen   hydrochlorothiazide (HYDRODIURIL) 25 MG tablet   ibuprofen (ADVIL,MOTRIN) 200 MG tablet   insulin glargine (LANTUS) 100 UNIT/ML injection   metFORMIN (GLUCOPHAGE-XR) 500 MG 24 hr tablet   methotrexate 2.5 MG tablet   metoprolol succinate (TOPROL-XL) 100 MG 24 hr tablet   naproxen sodium (ALEVE) 220 MG tablet   solifenacin (VESICARE) 5 MG tablet   tamsulosin (FLOMAX) 0.4 MG CAPS capsule   No current facility-administered medications for this encounter.    Myra Gianotti, PA-C Surgical Short Stay/Anesthesiology [facility] Phone [phone] [facility] Phone [phone] 04/15/2021 12:38 PM

## 2021-04-15 NOTE — Anesthesia Preprocedure Evaluation (Addendum)
Anesthesia Evaluation  Patient identified by MRN, date of birth, ID band Patient awake    Reviewed: Allergy & Precautions, NPO status , Patient's Chart, lab work & pertinent test results  Airway Mallampati: IV  TM Distance: >3 FB Neck ROM: Full    Dental  (+) Missing,    Pulmonary sleep apnea ,    Pulmonary exam normal breath sounds clear to auscultation       Cardiovascular hypertension, Pt. on medications and Pt. on home beta blockers + Peripheral Vascular Disease  Normal cardiovascular exam Rhythm:Regular Rate:Normal     Neuro/Psych Anxiety negative neurological ROS     GI/Hepatic Neg liver ROS, GERD  Medicated and Controlled,  Endo/Other  diabetes, Oral Hypoglycemic Agents, Insulin Dependent  Renal/GU negative Renal ROS     Musculoskeletal negative musculoskeletal ROS (+)   Abdominal (+) + obese,   Peds  Hematology negative hematology ROS (+)   Anesthesia Other Findings Atherosclerosis of native artery of right lower extremity with intermittent claudication  Reproductive/Obstetrics                           Anesthesia Physical Anesthesia Plan  ASA: 3  Anesthesia Plan: General   Post-op Pain Management:    Induction: Intravenous  PONV Risk Score and Plan: 2  Airway Management Planned: Oral ETT  Additional Equipment:   Intra-op Plan:   Post-operative Plan: Extubation in OR  Informed Consent: I have reviewed the patients History and Physical, chart, labs and discussed the procedure including the risks, benefits and alternatives for the proposed anesthesia with the patient or authorized representative who has indicated his/her understanding and acceptance.     Dental advisory given  Plan Discussed with: CRNA  Anesthesia Plan Comments: (Potential arterial line placement discussed  Reviewed PAT note written 04/15/2021 by Myra Gianotti, PA-C. )      Anesthesia Quick  Evaluation

## 2021-04-16 ENCOUNTER — Encounter (HOSPITAL_COMMUNITY)
Admission: RE | Disposition: A | Discharge status: Home / Self Care | Payer: Self-pay | Source: Home / Self Care | Attending: Surgery

## 2021-04-16 ENCOUNTER — Encounter (HOSPITAL_COMMUNITY): Payer: Self-pay | Admitting: Surgery

## 2021-04-16 ENCOUNTER — Other Ambulatory Visit: Payer: Self-pay

## 2021-04-16 ENCOUNTER — Inpatient Hospital Stay (HOSPITAL_COMMUNITY): Payer: Medicare Other | Admitting: Anesthesiology

## 2021-04-16 ENCOUNTER — Inpatient Hospital Stay (HOSPITAL_COMMUNITY): Payer: Medicare Other | Admitting: Vascular Surgery

## 2021-04-16 ENCOUNTER — Inpatient Hospital Stay (HOSPITAL_COMMUNITY)
Admission: RE | Admit: 2021-04-16 | Discharge: 2021-04-18 | DRG: 254 | Disposition: A | Discharge status: Home / Self Care | Payer: Medicare Other | Attending: Surgery | Admitting: Surgery

## 2021-04-16 DIAGNOSIS — Z8546 Personal history of malignant neoplasm of prostate: Secondary | ICD-10-CM

## 2021-04-16 DIAGNOSIS — Z880 Allergy status to penicillin: Secondary | ICD-10-CM | POA: Diagnosis not present

## 2021-04-16 DIAGNOSIS — Z885 Allergy status to narcotic agent status: Secondary | ICD-10-CM

## 2021-04-16 DIAGNOSIS — Z83438 Family history of other disorder of lipoprotein metabolism and other lipidemia: Secondary | ICD-10-CM | POA: Diagnosis not present

## 2021-04-16 DIAGNOSIS — E669 Obesity, unspecified: Secondary | ICD-10-CM | POA: Diagnosis present

## 2021-04-16 DIAGNOSIS — Z79899 Other long term (current) drug therapy: Secondary | ICD-10-CM | POA: Diagnosis not present

## 2021-04-16 DIAGNOSIS — Z20822 Contact with and (suspected) exposure to covid-19: Secondary | ICD-10-CM | POA: Diagnosis present

## 2021-04-16 DIAGNOSIS — I70211 Atherosclerosis of native arteries of extremities with intermittent claudication, right leg: Secondary | ICD-10-CM | POA: Diagnosis present

## 2021-04-16 DIAGNOSIS — Z8249 Family history of ischemic heart disease and other diseases of the circulatory system: Secondary | ICD-10-CM | POA: Diagnosis not present

## 2021-04-16 DIAGNOSIS — Z794 Long term (current) use of insulin: Secondary | ICD-10-CM | POA: Diagnosis not present

## 2021-04-16 DIAGNOSIS — E739 Lactose intolerance, unspecified: Secondary | ICD-10-CM | POA: Diagnosis present

## 2021-04-16 DIAGNOSIS — E1151 Type 2 diabetes mellitus with diabetic peripheral angiopathy without gangrene: Secondary | ICD-10-CM | POA: Diagnosis present

## 2021-04-16 DIAGNOSIS — K219 Gastro-esophageal reflux disease without esophagitis: Secondary | ICD-10-CM | POA: Diagnosis present

## 2021-04-16 DIAGNOSIS — E7800 Pure hypercholesterolemia, unspecified: Secondary | ICD-10-CM | POA: Diagnosis present

## 2021-04-16 DIAGNOSIS — I10 Essential (primary) hypertension: Secondary | ICD-10-CM | POA: Diagnosis present

## 2021-04-16 DIAGNOSIS — I739 Peripheral vascular disease, unspecified: Secondary | ICD-10-CM

## 2021-04-16 DIAGNOSIS — Z7902 Long term (current) use of antithrombotics/antiplatelets: Secondary | ICD-10-CM | POA: Diagnosis not present

## 2021-04-16 DIAGNOSIS — Z87891 Personal history of nicotine dependence: Secondary | ICD-10-CM | POA: Diagnosis not present

## 2021-04-16 DIAGNOSIS — Z6830 Body mass index (BMI) 30.0-30.9, adult: Secondary | ICD-10-CM

## 2021-04-16 DIAGNOSIS — Z809 Family history of malignant neoplasm, unspecified: Secondary | ICD-10-CM

## 2021-04-16 DIAGNOSIS — Z7984 Long term (current) use of oral hypoglycemic drugs: Secondary | ICD-10-CM | POA: Diagnosis not present

## 2021-04-16 HISTORY — PX: FEMORAL-POPLITEAL BYPASS GRAFT: SHX937

## 2021-04-16 HISTORY — PX: VEIN HARVEST: SHX6363

## 2021-04-16 HISTORY — PX: APPLICATION OF WOUND VAC: SHX5189

## 2021-04-16 HISTORY — PX: ENDARTERECTOMY FEMORAL: SHX5804

## 2021-04-16 LAB — CBC
HCT: 38.3 % — ABNORMAL LOW (ref 39.0–52.0)
Hemoglobin: 13.3 g/dL (ref 13.0–17.0)
MCH: 34.9 pg — ABNORMAL HIGH (ref 26.0–34.0)
MCHC: 34.7 g/dL (ref 30.0–36.0)
MCV: 100.5 fL — ABNORMAL HIGH (ref 80.0–100.0)
Platelets: 145 10*3/uL — ABNORMAL LOW (ref 150–400)
RBC: 3.81 MIL/uL — ABNORMAL LOW (ref 4.22–5.81)
RDW: 13.4 % (ref 11.5–15.5)
WBC: 11 10*3/uL — ABNORMAL HIGH (ref 4.0–10.5)
nRBC: 0 % (ref 0.0–0.2)

## 2021-04-16 LAB — GLUCOSE, CAPILLARY
Glucose-Capillary: 194 mg/dL — ABNORMAL HIGH (ref 70–99)
Glucose-Capillary: 215 mg/dL — ABNORMAL HIGH (ref 70–99)
Glucose-Capillary: 250 mg/dL — ABNORMAL HIGH (ref 70–99)
Glucose-Capillary: 220 mg/dL — ABNORMAL HIGH (ref 70–99)

## 2021-04-16 LAB — CREATININE, SERUM
Creatinine, Ser: 0.93 mg/dL (ref 0.61–1.24)
GFR, Estimated: >60 mL/min (ref >60)

## 2021-04-16 LAB — HEMOGLOBIN A1C
Hgb A1c MFr Bld: 8 % — ABNORMAL HIGH (ref 4.8–5.6)
Mean Plasma Glucose: 182.9 mg/dL

## 2021-04-16 LAB — ABO/RH: ABO/RH(D): A POS

## 2021-04-16 SURGERY — BYPASS GRAFT FEMORAL-POPLITEAL ARTERY
Anesthesia: General | Site: Leg Upper | Laterality: Right

## 2021-04-16 MED ORDER — POTASSIUM CHLORIDE CRYS ER 20 MEQ PO TBCR: 20.0000 meq | EXTENDED_RELEASE_TABLET | Freq: Every day | ORAL | Status: DC | PRN

## 2021-04-16 MED ORDER — ONDANSETRON HCL 4 MG/2ML IJ SOLN
INTRAMUSCULAR | Status: AC
  Administered 2021-04-16: 4 mg via INTRAVENOUS
  Filled 2021-04-16: qty 2

## 2021-04-16 MED ORDER — ALUM & MAG HYDROXIDE-SIMETH 200-200-20 MG/5ML PO SUSP: 15.0000 mL | ORAL | Status: DC | PRN

## 2021-04-16 MED ORDER — HYDROCHLOROTHIAZIDE 25 MG PO TABS
25.0000 mg | ORAL_TABLET | Freq: Every day | ORAL | Status: DC
  Administered 2021-04-17 – 2021-04-18 (×2): 25 mg via ORAL
  Filled 2021-04-16 (×2): qty 1

## 2021-04-16 MED ORDER — SODIUM CHLORIDE 0.9 % IV SOLN: 500.0000 mL | Freq: Once | INTRAVENOUS | Status: DC | PRN

## 2021-04-16 MED ORDER — LACTATED RINGERS IV SOLN: INTRAVENOUS | Status: DC

## 2021-04-16 MED ORDER — MIDAZOLAM HCL 2 MG/2ML IJ SOLN
INTRAMUSCULAR | Status: AC
  Filled 2021-04-16: qty 2

## 2021-04-16 MED ORDER — DOCUSATE SODIUM 100 MG PO CAPS
100.0000 mg | ORAL_CAPSULE | Freq: Every day | ORAL | Status: DC
  Administered 2021-04-17: 100 mg via ORAL
  Filled 2021-04-16 (×2): qty 1

## 2021-04-16 MED ORDER — IBUPROFEN 400 MG PO TABS: 400.0000 mg | ORAL_TABLET | Freq: Every day | ORAL | Status: DC | PRN

## 2021-04-16 MED ORDER — MORPHINE SULFATE (PF) 2 MG/ML IV SOLN
2.0000 mg | INTRAVENOUS | Status: DC | PRN
  Administered 2021-04-16 – 2021-04-17 (×3): 2 mg via INTRAVENOUS
  Filled 2021-04-16 (×4): qty 1

## 2021-04-16 MED ORDER — CHLORHEXIDINE GLUCONATE CLOTH 2 % EX PADS
6.0000 | MEDICATED_PAD | Freq: Once | CUTANEOUS | Status: DC
  Administered 2021-04-16: 6 via TOPICAL

## 2021-04-16 MED ORDER — FENTANYL CITRATE (PF) 100 MCG/2ML IJ SOLN
25.0000 ug | INTRAMUSCULAR | Status: DC | PRN
  Administered 2021-04-16 (×2): 50 ug via INTRAVENOUS

## 2021-04-16 MED ORDER — HEPARIN 6000 UNIT IRRIGATION SOLUTION
Status: DC | PRN
  Administered 2021-04-16: 1

## 2021-04-16 MED ORDER — HEPARIN SODIUM (PORCINE) 1000 UNIT/ML IJ SOLN
INTRAMUSCULAR | Status: DC | PRN
  Administered 2021-04-16: 2000 [IU] via INTRAVENOUS
  Administered 2021-04-16: 10000 [IU] via INTRAVENOUS

## 2021-04-16 MED ORDER — HYDROCODONE-ACETAMINOPHEN 5-325 MG PO TABS
1.0000 | ORAL_TABLET | ORAL | Status: DC | PRN
  Administered 2021-04-16: 2 via ORAL
  Administered 2021-04-18: 1 via ORAL
  Administered 2021-04-18: 2 via ORAL
  Filled 2021-04-16 (×2): qty 2
  Filled 2021-04-16: qty 1

## 2021-04-16 MED ORDER — GUAIFENESIN-DM 100-10 MG/5ML PO SYRP: 15.0000 mL | ORAL_SOLUTION | ORAL | Status: DC | PRN

## 2021-04-16 MED ORDER — ACETAMINOPHEN 650 MG RE SUPP: 325.0000 mg | RECTAL | Status: DC | PRN

## 2021-04-16 MED ORDER — PHENYLEPHRINE 40 MCG/ML (10ML) SYRINGE FOR IV PUSH (FOR BLOOD PRESSURE SUPPORT)
PREFILLED_SYRINGE | INTRAVENOUS | Status: DC | PRN
  Administered 2021-04-16 (×2): 80 ug via INTRAVENOUS

## 2021-04-16 MED ORDER — INSULIN LISPRO (1 UNIT DIAL) 100 UNIT/ML (KWIKPEN): 34.0000 [IU] | PEN_INJECTOR | Freq: Two times a day (BID) | SUBCUTANEOUS | Status: DC

## 2021-04-16 MED ORDER — MAGNESIUM CITRATE PO SOLN
1.0000 | Freq: Once | ORAL | Status: DC | PRN
  Filled 2021-04-16: qty 296

## 2021-04-16 MED ORDER — POLYETHYLENE GLYCOL 3350 17 G PO PACK
17.0000 g | PACK | Freq: Every day | ORAL | Status: DC
  Filled 2021-04-16: qty 1

## 2021-04-16 MED ORDER — HEPARIN SODIUM (PORCINE) 5000 UNIT/ML IJ SOLN
5000.0000 [IU] | Freq: Three times a day (TID) | INTRAMUSCULAR | Status: DC
  Administered 2021-04-17 – 2021-04-18 (×4): 5000 [IU] via SUBCUTANEOUS
  Filled 2021-04-16 (×4): qty 1

## 2021-04-16 MED ORDER — CHLORHEXIDINE GLUCONATE 0.12 % MT SOLN
OROMUCOSAL | Status: AC
  Administered 2021-04-16: 15 mL via OROMUCOSAL
  Filled 2021-04-16: qty 15

## 2021-04-16 MED ORDER — CLONAZEPAM 1 MG PO TABS
1.0000 mg | ORAL_TABLET | Freq: Every evening | ORAL | Status: DC | PRN
  Administered 2021-04-16 – 2021-04-17 (×2): 1 mg via ORAL
  Filled 2021-04-16 (×2): qty 1

## 2021-04-16 MED ORDER — INSULIN ASPART 100 UNIT/ML IJ SOLN
0.0000 [IU] | Freq: Three times a day (TID) | INTRAMUSCULAR | Status: DC
  Administered 2021-04-17 (×3): 7 [IU] via SUBCUTANEOUS
  Administered 2021-04-18: 11 [IU] via SUBCUTANEOUS
  Administered 2021-04-18: 7 [IU] via SUBCUTANEOUS

## 2021-04-16 MED ORDER — ATORVASTATIN CALCIUM 40 MG PO TABS
40.0000 mg | ORAL_TABLET | Freq: Every day | ORAL | Status: DC
  Administered 2021-04-16 – 2021-04-17 (×2): 40 mg via ORAL
  Filled 2021-04-16 (×2): qty 1

## 2021-04-16 MED ORDER — PHENYLEPHRINE HCL-NACL 20-0.9 MG/250ML-% IV SOLN
INTRAVENOUS | Status: DC | PRN
  Administered 2021-04-16: 50 ug/min via INTRAVENOUS

## 2021-04-16 MED ORDER — BISACODYL 10 MG RE SUPP: 10.0000 mg | Freq: Every day | RECTAL | Status: DC | PRN

## 2021-04-16 MED ORDER — ONDANSETRON HCL 4 MG/2ML IJ SOLN
INTRAMUSCULAR | Status: DC | PRN
  Administered 2021-04-16: 4 mg via INTRAVENOUS

## 2021-04-16 MED ORDER — ACETAMINOPHEN 500 MG PO TABS
1000.0000 mg | ORAL_TABLET | Freq: Once | ORAL | Status: AC
  Administered 2021-04-16: 1000 mg via ORAL
  Filled 2021-04-16: qty 2

## 2021-04-16 MED ORDER — SENNOSIDES-DOCUSATE SODIUM 8.6-50 MG PO TABS: 1.0000 | ORAL_TABLET | Freq: Every evening | ORAL | Status: DC | PRN

## 2021-04-16 MED ORDER — BENAZEPRIL HCL 5 MG PO TABS
40.0000 mg | ORAL_TABLET | Freq: Every day | ORAL | Status: DC
  Administered 2021-04-17 – 2021-04-18 (×2): 40 mg via ORAL
  Filled 2021-04-16 (×2): qty 8

## 2021-04-16 MED ORDER — FENTANYL CITRATE (PF) 100 MCG/2ML IJ SOLN
INTRAMUSCULAR | Status: AC
  Administered 2021-04-16: 50 ug via INTRAVENOUS
  Filled 2021-04-16: qty 2

## 2021-04-16 MED ORDER — ROCURONIUM BROMIDE 10 MG/ML (PF) SYRINGE
PREFILLED_SYRINGE | INTRAVENOUS | Status: DC | PRN
Start: 2021-04-16 — End: 2021-04-16
  Administered 2021-04-16: 60 mg via INTRAVENOUS
  Administered 2021-04-16 (×3): 20 mg via INTRAVENOUS

## 2021-04-16 MED ORDER — ONDANSETRON HCL 4 MG/2ML IJ SOLN
4.0000 mg | Freq: Four times a day (QID) | INTRAMUSCULAR | Status: DC | PRN
  Administered 2021-04-16 (×2): 4 mg via INTRAVENOUS
  Filled 2021-04-16 (×2): qty 2

## 2021-04-16 MED ORDER — ONDANSETRON HCL 4 MG/2ML IJ SOLN: 4.0000 mg | Freq: Once | INTRAMUSCULAR | Status: AC | PRN

## 2021-04-16 MED ORDER — METOPROLOL SUCCINATE ER 100 MG PO TB24
100.0000 mg | ORAL_TABLET | Freq: Every day | ORAL | Status: DC
  Administered 2021-04-17 – 2021-04-18 (×2): 100 mg via ORAL
  Filled 2021-04-16 (×2): qty 1

## 2021-04-16 MED ORDER — PANTOPRAZOLE SODIUM 40 MG PO TBEC
40.0000 mg | DELAYED_RELEASE_TABLET | Freq: Every day | ORAL | Status: DC
  Administered 2021-04-17 – 2021-04-18 (×2): 40 mg via ORAL
  Filled 2021-04-16 (×2): qty 1

## 2021-04-16 MED ORDER — PROPOFOL 10 MG/ML IV BOLUS
INTRAVENOUS | Status: AC
  Filled 2021-04-16: qty 20

## 2021-04-16 MED ORDER — METFORMIN HCL ER 500 MG PO TB24
2000.0000 mg | ORAL_TABLET | Freq: Every day | ORAL | Status: DC
  Administered 2021-04-16 – 2021-04-17 (×2): 2000 mg via ORAL
  Filled 2021-04-16 (×2): qty 4

## 2021-04-16 MED ORDER — ACETAMINOPHEN 325 MG PO TABS: 325.0000 mg | ORAL_TABLET | ORAL | Status: DC | PRN

## 2021-04-16 MED ORDER — LABETALOL HCL 5 MG/ML IV SOLN: 10.0000 mg | INTRAVENOUS | Status: DC | PRN

## 2021-04-16 MED ORDER — LACTATED RINGERS IV SOLN: INTRAVENOUS | Status: DC | PRN

## 2021-04-16 MED ORDER — PROTAMINE SULFATE 10 MG/ML IV SOLN
INTRAVENOUS | Status: DC | PRN
Start: 2021-04-16 — End: 2021-04-16
  Administered 2021-04-16: 50 mg via INTRAVENOUS

## 2021-04-16 MED ORDER — PHENOL 1.4 % MT LIQD: 1.0000 | OROMUCOSAL | Status: DC | PRN

## 2021-04-16 MED ORDER — CLOPIDOGREL BISULFATE 75 MG PO TABS
75.0000 mg | ORAL_TABLET | Freq: Every day | ORAL | Status: DC
  Administered 2021-04-17 – 2021-04-18 (×2): 75 mg via ORAL
  Filled 2021-04-16 (×2): qty 1

## 2021-04-16 MED ORDER — KETOROLAC TROMETHAMINE 30 MG/ML IJ SOLN
30.0000 mg | Freq: Four times a day (QID) | INTRAMUSCULAR | Status: DC
  Administered 2021-04-16 – 2021-04-17 (×2): 30 mg via INTRAVENOUS
  Filled 2021-04-16 (×2): qty 1

## 2021-04-16 MED ORDER — CHLORHEXIDINE GLUCONATE 0.12 % MT SOLN
15.0000 mL | Freq: Once | OROMUCOSAL | Status: AC
  Filled 2021-04-16: qty 15

## 2021-04-16 MED ORDER — VANCOMYCIN HCL IN DEXTROSE 1-5 GM/200ML-% IV SOLN
1000.0000 mg | Freq: Two times a day (BID) | INTRAVENOUS | Status: AC
  Administered 2021-04-16 – 2021-04-17 (×2): 1000 mg via INTRAVENOUS
  Filled 2021-04-16 (×2): qty 200

## 2021-04-16 MED ORDER — HEPARIN 6000 UNIT IRRIGATION SOLUTION
Status: AC
  Filled 2021-04-16: qty 500

## 2021-04-16 MED ORDER — AMLODIPINE BESYLATE 10 MG PO TABS
10.0000 mg | ORAL_TABLET | Freq: Every day | ORAL | Status: DC
  Administered 2021-04-17 – 2021-04-18 (×2): 10 mg via ORAL
  Filled 2021-04-16 (×2): qty 1

## 2021-04-16 MED ORDER — FOLIC ACID 1 MG PO TABS
1.0000 mg | ORAL_TABLET | Freq: Every day | ORAL | Status: DC
  Administered 2021-04-17 – 2021-04-18 (×2): 1 mg via ORAL
  Filled 2021-04-16 (×2): qty 1

## 2021-04-16 MED ORDER — METOPROLOL TARTRATE 5 MG/5ML IV SOLN: 2.0000 mg | INTRAVENOUS | Status: DC | PRN

## 2021-04-16 MED ORDER — SODIUM CHLORIDE 0.9 % IV SOLN: INTRAVENOUS | Status: DC

## 2021-04-16 MED ORDER — MIDAZOLAM HCL 2 MG/2ML IJ SOLN
INTRAMUSCULAR | Status: DC | PRN
Start: 2021-04-16 — End: 2021-04-16
  Administered 2021-04-16: 1 mg via INTRAVENOUS

## 2021-04-16 MED ORDER — DAPAGLIFLOZIN PROPANEDIOL 5 MG PO TABS
5.0000 mg | ORAL_TABLET | Freq: Every day | ORAL | Status: DC
  Administered 2021-04-17 – 2021-04-18 (×2): 5 mg via ORAL
  Filled 2021-04-16 (×2): qty 1

## 2021-04-16 MED ORDER — AMISULPRIDE (ANTIEMETIC) 5 MG/2ML IV SOLN: 10.0000 mg | Freq: Once | INTRAVENOUS | Status: DC | PRN

## 2021-04-16 MED ORDER — 0.9 % SODIUM CHLORIDE (POUR BTL) OPTIME
TOPICAL | Status: DC | PRN
  Administered 2021-04-16: 2000 mL

## 2021-04-16 MED ORDER — FAMOTIDINE 20 MG PO TABS
20.0000 mg | ORAL_TABLET | Freq: Two times a day (BID) | ORAL | Status: DC
Start: 2021-04-16 — End: 2021-04-18
  Administered 2021-04-16 – 2021-04-18 (×4): 20 mg via ORAL
  Filled 2021-04-16 (×4): qty 1

## 2021-04-16 MED ORDER — TAMSULOSIN HCL 0.4 MG PO CAPS
0.4000 mg | ORAL_CAPSULE | Freq: Every day | ORAL | Status: DC
  Administered 2021-04-16 – 2021-04-17 (×2): 0.4 mg via ORAL
  Filled 2021-04-16 (×2): qty 1

## 2021-04-16 MED ORDER — FENTANYL CITRATE (PF) 250 MCG/5ML IJ SOLN
INTRAMUSCULAR | Status: AC
  Filled 2021-04-16: qty 5

## 2021-04-16 MED ORDER — INSULIN ASPART 100 UNIT/ML IJ SOLN
34.0000 [IU] | Freq: Two times a day (BID) | INTRAMUSCULAR | Status: DC
  Administered 2021-04-16: 10 [IU] via SUBCUTANEOUS

## 2021-04-16 MED ORDER — MAGNESIUM SULFATE 2 GM/50ML IV SOLN: 2.0000 g | Freq: Every day | INTRAVENOUS | Status: DC | PRN

## 2021-04-16 MED ORDER — FENTANYL CITRATE (PF) 100 MCG/2ML IJ SOLN
INTRAMUSCULAR | Status: AC
  Filled 2021-04-16: qty 2

## 2021-04-16 MED ORDER — HEMOSTATIC AGENTS (NO CHARGE) OPTIME
TOPICAL | Status: DC | PRN
  Administered 2021-04-16: 1 via TOPICAL

## 2021-04-16 MED ORDER — METHOTREXATE 2.5 MG PO TABS: 15.0000 mg | ORAL_TABLET | ORAL | Status: DC

## 2021-04-16 MED ORDER — HYDRALAZINE HCL 20 MG/ML IJ SOLN: 5.0000 mg | INTRAMUSCULAR | Status: DC | PRN

## 2021-04-16 MED ORDER — FENTANYL CITRATE (PF) 250 MCG/5ML IJ SOLN
INTRAMUSCULAR | Status: DC | PRN
Start: 2021-04-16 — End: 2021-04-16
  Administered 2021-04-16 (×2): 50 ug via INTRAVENOUS
  Administered 2021-04-16: 100 ug via INTRAVENOUS
  Administered 2021-04-16: 50 ug via INTRAVENOUS

## 2021-04-16 MED ORDER — SUGAMMADEX SODIUM 200 MG/2ML IV SOLN
INTRAVENOUS | Status: DC | PRN
Start: 2021-04-16 — End: 2021-04-16
  Administered 2021-04-16: 200 mg via INTRAVENOUS

## 2021-04-16 SURGICAL SUPPLY — 67 items
ADH SKN CLS APL DERMABOND .7 (GAUZE/BANDAGES/DRESSINGS) ×2
AGENT HMST KT MTR STRL THRMB (HEMOSTASIS) ×2
BAG COUNTER SPONGE SURGICOUNT (BAG) ×3 IMPLANT
BAG SPNG CNTER NS LX DISP (BAG) ×2
BANDAGE ESMARK 6X9 LF (GAUZE/BANDAGES/DRESSINGS) IMPLANT
BNDG CMPR 9X6 STRL LF SNTH (GAUZE/BANDAGES/DRESSINGS) ×2
BNDG ESMARK 6X9 LF (GAUZE/BANDAGES/DRESSINGS) ×3
CANISTER SUCT 3000ML PPV (MISCELLANEOUS) ×3 IMPLANT
CANNULA VESSEL 3MM 2 BLNT TIP (CANNULA) ×4 IMPLANT
CLIP VESOCCLUDE MED 24/CT (CLIP) ×3 IMPLANT
CLIP VESOCCLUDE SM WIDE 24/CT (CLIP) ×3 IMPLANT
CUFF TOURN SGL QUICK 24 (TOURNIQUET CUFF) ×3
CUFF TOURN SGL QUICK 34 (TOURNIQUET CUFF)
CUFF TOURN SGL QUICK 42 (TOURNIQUET CUFF) IMPLANT
CUFF TRNQT CYL 24X4X16.5-23 (TOURNIQUET CUFF) IMPLANT
CUFF TRNQT CYL 34X4.125X (TOURNIQUET CUFF) IMPLANT
DERMABOND ADVANCED (GAUZE/BANDAGES/DRESSINGS) ×1
DERMABOND ADVANCED .7 DNX12 (GAUZE/BANDAGES/DRESSINGS) ×2 IMPLANT
DRAIN CHANNEL 15F RND FF W/TCR (WOUND CARE) IMPLANT
DRAPE HALF SHEET 40X57 (DRAPES) ×1 IMPLANT
DRAPE X-RAY CASS 24X20 (DRAPES) IMPLANT
DRESSING PEEL AND PLC PRVNA 13 (GAUZE/BANDAGES/DRESSINGS) IMPLANT
DRSG PEEL AND PLACE PREVENA 13 (GAUZE/BANDAGES/DRESSINGS) ×3
ELECT REM PT RETURN 9FT ADLT (ELECTROSURGICAL) ×3
ELECTRODE REM PT RTRN 9FT ADLT (ELECTROSURGICAL) ×2 IMPLANT
EVACUATOR SILICONE 100CC (DRAIN) IMPLANT
GLOVE SRG 8 PF TXTR STRL LF DI (GLOVE) ×2 IMPLANT
GLOVE SURG ENC MOIS LTX SZ6.5 (GLOVE) ×2 IMPLANT
GLOVE SURG ENC MOIS LTX SZ7.5 (GLOVE) ×1 IMPLANT
GLOVE SURG POLYISO LF SZ7.5 (GLOVE) ×3 IMPLANT
GLOVE SURG UNDER POLY LF SZ8 (GLOVE) ×3
GOWN STRL REUS W/ TWL LRG LVL3 (GOWN DISPOSABLE) ×4 IMPLANT
GOWN STRL REUS W/ TWL XL LVL3 (GOWN DISPOSABLE) ×2 IMPLANT
GOWN STRL REUS W/TWL LRG LVL3 (GOWN DISPOSABLE) ×6
GOWN STRL REUS W/TWL XL LVL3 (GOWN DISPOSABLE) ×3
HEMOSTAT SNOW SURGICEL 2X4 (HEMOSTASIS) IMPLANT
INSERT FOGARTY SM (MISCELLANEOUS) IMPLANT
KIT BASIN OR (CUSTOM PROCEDURE TRAY) ×3 IMPLANT
KIT DRSG PREVENA PLUS 7DAY 125 (MISCELLANEOUS) ×1 IMPLANT
KIT TURNOVER KIT B (KITS) ×3 IMPLANT
MARKER GRAFT CORONARY BYPASS (MISCELLANEOUS) IMPLANT
NS IRRIG 1000ML POUR BTL (IV SOLUTION) ×6 IMPLANT
PACK PERIPHERAL VASCULAR (CUSTOM PROCEDURE TRAY) ×3 IMPLANT
PAD ARMBOARD 7.5X6 YLW CONV (MISCELLANEOUS) ×6 IMPLANT
SET COLLECT BLD 21X3/4 12 (NEEDLE) IMPLANT
STOPCOCK 4 WAY LG BORE MALE ST (IV SETS) IMPLANT
SURGIFLO W/THROMBIN 8M KIT (HEMOSTASIS) ×1 IMPLANT
SUT ETHILON 3 0 PS 1 (SUTURE) IMPLANT
SUT GORETEX 6.0 TT13 (SUTURE) IMPLANT
SUT GORETEX 6.0 TT9 (SUTURE) IMPLANT
SUT PROLENE 5 0 C 1 24 (SUTURE) ×5 IMPLANT
SUT PROLENE 6 0 BV (SUTURE) ×4 IMPLANT
SUT PROLENE 7 0 BV 1 (SUTURE) IMPLANT
SUT SILK 2 0 SH (SUTURE) ×3 IMPLANT
SUT SILK 3 0 (SUTURE) ×3
SUT SILK 3-0 18XBRD TIE 12 (SUTURE) IMPLANT
SUT VIC AB 2-0 CT1 27 (SUTURE) ×6
SUT VIC AB 2-0 CT1 TAPERPNT 27 (SUTURE) ×4 IMPLANT
SUT VIC AB 3-0 SH 27 (SUTURE) ×9
SUT VIC AB 3-0 SH 27X BRD (SUTURE) ×4 IMPLANT
SUT VIC AB 3-0 X1 27 (SUTURE) ×1 IMPLANT
SUT VICRYL 4-0 PS2 18IN ABS (SUTURE) ×6 IMPLANT
TOWEL GREEN STERILE (TOWEL DISPOSABLE) ×3 IMPLANT
TRAY FOLEY MTR SLVR 16FR STAT (SET/KITS/TRAYS/PACK) ×3 IMPLANT
TUBING EXTENTION W/L.L. (IV SETS) IMPLANT
UNDERPAD 30X36 HEAVY ABSORB (UNDERPADS AND DIAPERS) ×3 IMPLANT
WATER STERILE IRR 1000ML POUR (IV SOLUTION) ×3 IMPLANT

## 2021-04-16 NOTE — Transfer of Care (Signed)
Immediate Anesthesia Transfer of Care Note  Patient: APOLINAR BERO  Procedure(s) Performed: RIGHT FEMORAL-POPLITEAL BYPASS USING VEIN (Right: Leg Upper) RIGHT FEMORAL ENDARTERECTOMY (Right: Groin) RIGHT GREATER SAPHENOUS VEIN HARVEST (Right: Leg Upper) APPLICATION OF WOUND VAC (Right: Groin)  Patient Location: PACU  Anesthesia Type:General  Level of Consciousness: awake and alert   Airway & Oxygen Therapy: Patient Spontanous Breathing  Post-op Assessment: Report given to RN and Post -op Vital signs reviewed and stable  Post vital signs: Reviewed and stable  Last Vitals:  Vitals Value Taken Time  BP 143/97 04/16/21 1241  Temp    Pulse 84 04/16/21 1246  Resp 16 04/16/21 1246  SpO2 97 % 04/16/21 1246  Vitals shown include unvalidated device data.  Last Pain:  Vitals:   04/16/21 0728  TempSrc:   PainSc: 5          Complications: No notable events documented.

## 2021-04-16 NOTE — Anesthesia Procedure Notes (Signed)
Procedure Name: Intubation Date/Time: 04/16/2021 8:48 AM Performed by: Eligha Bridegroom, CRNA Pre-anesthesia Checklist: Patient identified, Emergency Drugs available, Suction available, Patient being monitored and Timeout performed Patient Re-evaluated:Patient Re-evaluated prior to induction Oxygen Delivery Method: Circle system utilized Preoxygenation: Pre-oxygenation with 100% oxygen Induction Type: IV induction Ventilation: Oral airway inserted - appropriate to patient size and Mask ventilation without difficulty Laryngoscope Size: Mac and 4 Grade View: Grade II Tube type: Oral Tube size: 7.5 mm Number of attempts: 1 Airway Equipment and Method: Stylet Placement Confirmation: ETT inserted through vocal cords under direct vision, positive ETCO2 and breath sounds checked- equal and bilateral Secured at: 22 cm Tube secured with: Tape Dental Injury: Teeth and Oropharynx as per pre-operative assessment

## 2021-04-16 NOTE — Anesthesia Postprocedure Evaluation (Signed)
Anesthesia Post Note  Patient: Michael [name]  Procedure(s) Performed: RIGHT FEMORAL-POPLITEAL BYPASS USING VEIN (Right: Leg Upper) RIGHT FEMORAL ENDARTERECTOMY (Right: Groin) RIGHT GREATER SAPHENOUS VEIN HARVEST (Right: Leg Upper) APPLICATION OF WOUND VAC (Right: Groin)     Patient location during evaluation: PACU Anesthesia Type: General Level of consciousness: awake Pain management: pain level controlled Vital Signs Assessment: post-procedure vital signs reviewed and stable Respiratory status: spontaneous breathing, nonlabored ventilation, respiratory function stable and patient connected to nasal cannula oxygen Cardiovascular status: blood pressure returned to baseline and stable Postop Assessment: no apparent nausea or vomiting Anesthetic complications: no   No notable events documented.  Last Vitals:  Vitals:   04/16/21 1441 04/16/21 1518  BP: 136/76 140/80  Pulse: 73 76  Resp: 13 14  Temp: 36.6 C (!) 36.4 C  SpO2: 100% 98%    Last Pain:  Vitals:   04/16/21 1518  TempSrc: Oral  PainSc: 8                  Joane Postel P Kavon Valenza

## 2021-04-16 NOTE — Progress Notes (Signed)
Patient brought to 4E from PACU. Telemetry box applied, CCMD notified. VSS. Patient oriented to room and staff. Call bell in reach.  Daymon Larsen, RN

## 2021-04-16 NOTE — Progress Notes (Signed)
Spoke to Dr. Roanna Banning regarding diabetes protocol.  Patient's BG is 194.  He took Humolog 10 units at 0500 today.  Per Dr. Roanna Banning does not need insulin.

## 2021-04-16 NOTE — Op Note (Signed)
Patient name: Michael [name] MRN: [id number] DOB: 10/17/1950 Sex: male  04/16/2021 Pre-operative Diagnosis: right leg claudication Post-operative diagnosis:  Same Surgeon:  Annamarie Major Assistants:  Laurence Slate, PA Procedure:   #1:  right femoral above knee popliteal bypass with non-reversed ipsilateral saphenous vein   #2:  right common femoral and profunda femoral endarterectomy   #3:  Provena wound vac to right groin incision Anesthesia:  General Blood Loss:  100 cc Specimens:  None  Findings:  extensive circumferential calcification of the common femoral and profunda femoral arteries requiring endarterectomy.  I spatulated the proximal anastamosis to serve as a patch.  The vein was 3-4 mm in uniform diameter  Indications:  71 yo male with failed percutaneous intervention, now with severe claudication.  He is here today for bypass  Procedure:  The patient was identified in the holding area and taken to [facility]  The patient was then placed supine on the table. general anesthesia was administered.  The patient was prepped and draped in the usual sterile fashion.  A time out was called and antibiotics were administered.  A PA was necessary to expedite the procedure and assist with technical details.  I began by making a longitudinal in the right groin.  Cautery was used to divide the subcutaneous tissue down to the femoral sheath which was opened sharply.  The common femoral artery was exposed from the inguinal ligament down to the bifurcation.  The profunda was exposed down to the primary bifurcation.  The SFA was also isolated.  Next, I dissected out the saphenous vein through skip incisions.  I then exposed the above knee popliteal artery which was relatively disease free.  Next, the vein was ligated proximally and distally.  It was prepared on the back table, and it distended nicely to 3-4 mm.  It was marked for orientation.  A subsartorial tunnel was then created.  The patient  was fully heparinized.  Next the femoral vessels were occluded.  A #11 blade was used to make an arteriotomy which was extended longitudinally with Potts scissors.  The length of the arteriotomy was approximately 3 cm.  I performed an extensive endarterectomy of the common femoral artery down into the superficial femoral artery which was chronically occluded.  I then performed an eversion endarterectomy of the profundofemoral artery down to the primary branches.  A good distal endpoint was achieved.  Next the vein was spatulated to fit the size the arteriotomy in a running anastomosis was created with 5-0 Prolene.  Prior to completion the appropriate flushing maneuvers were performed and the anastomosis was completed.  The clamps were released.  1 repair stitch was required.  Next, a valvulotome was used to lyse the valves.  This was performed twice.  There was excellent pulsatile flow through the vein graft.  Next the vein graft was brought through the previously created tunnel making sure to maintain proper orientation.  A Webril was placed in the proximal thigh followed by tourniquet.  An Esmarch was used to exsanguinate the leg.  The tourniquet was taken to 250 mm of pressure.  A #11 blade was used to make an arteriotomy in the above-knee popliteal artery which was extended longitudinally with Potts scissors.  The vein was then cut to the appropriate length and spatulated to fit the size of the arteriotomy.  A running anastomosis was created with 6-0 Prolene.  Prior to completion, the tourniquet was let down and the appropriate flushing maneuvers were  performed.  The anastomosis was completed.  At this point, there was a excellent pulse within the graft.  There was a brisk posterior tibial and peroneal Doppler signal that were graft dependent.  The patient's heparin was reversed with 50 mg of protamine.  Hemostasis was achieved.  The vein harvest incisions were closed with 3-0 Vicryl on the subcutaneous tissue  and 4-0 Vicryl on the skin.  The above-knee incision was closed by reapproximating the fascia with 2-0 Vicryl, subcutaneous tissue with 3-0 Vicryl and subcuticular closure.  The groin was then irrigated.  The femoral sheath was reapproximated 2-0 Vicryl.  The subcutaneous tissue was then closed with additional layers of Vicryl followed by subcuticular closure.  A Prevena wound VAC was placed over the incision in the groin.  Dermabond was placed on the other incisions.  The patient was successfully extubated and taken recovery in stable condition.  There are no immediate complications.   Disposition: To PACU stable   V. Annamarie Major, M.D., [facility] [place] Office: [phone] Pager:  [phone]

## 2021-04-16 NOTE — Interval H&P Note (Signed)
History and Physical Interval Note:  04/16/2021 8:13 AM  Michael [name]  has presented today for surgery, with the diagnosis of Atherosclerosis of native artery of right lower extremity with intermittent claudication.  The various methods of treatment have been discussed with the patient and family. After consideration of risks, benefits and other options for treatment, the patient has consented to  Procedure(s): RIGHT FEMORAL-POPLITEAL BYPASS (Right) as a surgical intervention.  The patient's history has been reviewed, patient examined, no change in status, stable for surgery.  I have reviewed the patient's chart and labs.  Questions were answered to the patient's satisfaction.     Annamarie Major

## 2021-04-17 ENCOUNTER — Encounter (HOSPITAL_COMMUNITY): Payer: Self-pay | Admitting: Surgery

## 2021-04-17 LAB — CBC
HCT: 39.4 % (ref 39.0–52.0)
Hemoglobin: 13.3 g/dL (ref 13.0–17.0)
MCH: 34 pg (ref 26.0–34.0)
MCHC: 33.8 g/dL (ref 30.0–36.0)
MCV: 100.8 fL — ABNORMAL HIGH (ref 80.0–100.0)
Platelets: 139 10*3/uL — ABNORMAL LOW (ref 150–400)
RBC: 3.91 MIL/uL — ABNORMAL LOW (ref 4.22–5.81)
RDW: 13.4 % (ref 11.5–15.5)
WBC: 9.7 10*3/uL (ref 4.0–10.5)
nRBC: 0 % (ref 0.0–0.2)

## 2021-04-17 LAB — BASIC METABOLIC PANEL
Anion gap: 10 (ref 5–15)
BUN: 15 mg/dL (ref 8–23)
CO2: 23 mmol/L (ref 22–32)
Calcium: 8.6 mg/dL — ABNORMAL LOW (ref 8.9–10.3)
Chloride: 105 mmol/L (ref 98–111)
Creatinine, Ser: 0.86 mg/dL (ref 0.61–1.24)
GFR, Estimated: >60 mL/min (ref >60)
Glucose, Bld: 174 mg/dL — ABNORMAL HIGH (ref 70–99)
Potassium: 4.1 mmol/L (ref 3.5–5.1)
Sodium: 138 mmol/L (ref 135–145)

## 2021-04-17 LAB — LIPID PANEL
Cholesterol: 121 mg/dL (ref 0–200)
HDL: 50 mg/dL (ref >40)
LDL Cholesterol: 57 mg/dL (ref 0–99)
Total CHOL/HDL Ratio: 2.4 RATIO
Triglycerides: 70 mg/dL (ref <150)
VLDL: 14 mg/dL (ref 0–40)

## 2021-04-17 LAB — GLUCOSE, CAPILLARY
Glucose-Capillary: 240 mg/dL — ABNORMAL HIGH (ref 70–99)
Glucose-Capillary: 215 mg/dL — ABNORMAL HIGH (ref 70–99)
Glucose-Capillary: 250 mg/dL — ABNORMAL HIGH (ref 70–99)

## 2021-04-17 MED ORDER — KETOROLAC TROMETHAMINE 30 MG/ML IJ SOLN
15.0000 mg | Freq: Four times a day (QID) | INTRAMUSCULAR | Status: DC
  Administered 2021-04-17 (×3): 15 mg via INTRAVENOUS
  Filled 2021-04-17 (×6): qty 1

## 2021-04-17 MED ORDER — INSULIN GLARGINE-YFGN 100 UNIT/ML ~~LOC~~ SOLN
10.0000 [IU] | Freq: Every day | SUBCUTANEOUS | Status: DC
  Administered 2021-04-17 – 2021-04-18 (×2): 10 [IU] via SUBCUTANEOUS
  Filled 2021-04-17 (×2): qty 0.1

## 2021-04-17 MED ORDER — CALCIUM CARBONATE ANTACID 500 MG PO CHEW
1.0000 | CHEWABLE_TABLET | Freq: Every day | ORAL | Status: DC
  Administered 2021-04-17 – 2021-04-18 (×2): 200 mg via ORAL
  Filled 2021-04-17 (×2): qty 1

## 2021-04-17 NOTE — Progress Notes (Signed)
Inpatient Diabetes Program Recommendations  AACE/ADA: New Consensus Statement on Inpatient Glycemic Control (2015)  Target Ranges:  Prepandial:   less than 140 mg/dL      Peak postprandial:   less than 180 mg/dL (1-2 hours)      Critically ill patients:  140 - 180 mg/dL    Latest Reference Range & Units 04/16/21 06:41 04/16/21 12:47 04/16/21 17:14 04/16/21 21:27 04/17/21 06:27  Glucose-Capillary 70 - 99 mg/dL 194 (H) 215 (H) 250 (H)  Novolog 10 units@ 18:00 220 (H) 240 (H)  Novolog 7 units@ 6:33   Review of Glycemic Control  Diabetes history: DM2 Outpatient Diabetes medications: Lantus 16 units QHS, Farxiga 5 mg daily, Trulicity 1.5 mg Qweek, Humalog 34 units BID, Metformin XR 2000 mg QHS Current orders for Inpatient glycemic control: Novolog 0-20 units TID with meals, Novolog 34 units BID, Farxiga 5 mg daily, Metformin XR 2000 mg QHS  Inpatient Diabetes Program Recommendations:    Insulin: Please discontinue Novolog 34 units BID. Please consider ordering Semglee 10 units Q24H (to start now) and Novolog 4 units TID with meals for meal coverage if patient eats at least 50% of meals.  Thanks, Barnie Alderman, RN, MSN, CDE Diabetes Coordinator Inpatient Diabetes Program [phone] (Team Pager from 8am to 5pm)

## 2021-04-17 NOTE — Discharge Instructions (Addendum)
 [facility] [place]  Discharge instructions  Lower Extremity Bypass Surgery  Please refer to the following instruction for your post-procedure care. Your surgeon or physician assistant will discuss any changes with you.  Activity  You are encouraged to walk as much as you can. You can slowly return to normal activities during the month after your surgery. Avoid strenuous activity and heavy lifting until your doctor tells you it's OK. Avoid activities such as vacuuming or swinging a golf club. Do not drive until your doctor give the OK and you are no longer taking prescription pain medications. It is also normal to have difficulty with sleep habits, eating and bowel movement after surgery. These will go away with time.  Bathing/Showering  Shower daily after you go home. Do not soak in a bathtub, hot tub, or swim until the incision heals completely.  Incision Care  Clean your incision with mild soap and water. Shower every day. Pat the area dry with a clean towel. You do not need a bandage unless otherwise instructed. Do not apply any ointments or creams to your incision. If you have open wounds you will be instructed how to care for them or a visiting nurse may be arranged for you. If you have staples or sutures along your incision they will be removed at your post-op appointment. You may have skin glue on your incision. Do not peel it off. It will come off on its own in about one week.  Keep Pravena wound vac on your groin incision until it loses it seal in about 7-10 days.  Once that happens, you can remove and then wash the groin wound with soap and water daily and pat dry. (No tub bath-only shower)  Then put a dry gauze or washcloth in the groin to keep this area dry to help prevent wound infection.  Do this daily and as needed.  Do not use Vaseline or neosporin on your incisions.  Only use soap and water on your incisions and then protect and keep  dry.   Diet  Resume your normal diet. There are no special food restrictions following this procedure. A low fat/ low cholesterol diet is recommended for all patients with vascular disease. In order to heal from your surgery, it is CRITICAL to get adequate nutrition. Your body requires vitamins, minerals, and protein. Vegetables are the best source of vitamins and minerals. Vegetables also provide the perfect balance of protein. Processed food has little nutritional value, so try to avoid this.  Medications  Resume taking all your medications unless your doctor or physician assistant tells you not to. If your incision is causing pain, you may take over-the-counter pain relievers such as acetaminophen (Tylenol). If you were prescribed a stronger pain medication, please aware these medication can cause nausea and constipation. Prevent nausea by taking the medication with a snack or meal. Avoid constipation by drinking plenty of fluids and eating foods with high amount of fiber, such as fruits, vegetables, and grains. Take Colace 100 mg (an over-the-counter stool softener) twice a day as needed for constipation.  Do not take Tylenol if you are taking prescription pain medications.  Follow Up  Our office will schedule a follow up appointment 2-3 weeks following discharge.  Please call us immediately for any of the following conditions  Severe or worsening pain in your legs or feet while at rest or while walking Increase pain, redness, warmth, or drainage (pus) from your incision site(s) Fever of 101   degree or higher The swelling in your leg with the bypass suddenly worsens and becomes more painful than when you were in the hospital If you have been instructed to feel your graft pulse then you should do so every day. If you can no longer feel this pulse, call the office immediately. Not all patients are given this instruction.  Leg swelling is common after leg bypass surgery.  The swelling should  improve over a few months following surgery. To improve the swelling, you may elevate your legs above the level of your heart while you are sitting or resting. Your surgeon or physician assistant may ask you to apply an ACE wrap or wear compression (TED) stockings to help to reduce swelling.  Reduce your risk of vascular disease  Stop smoking. If you would like help call [facility] at 1-800-QUIT-NOW (1-800-784-8669) or [facility] at 336-586-4000.  Manage your cholesterol Maintain a desired weight Control your diabetes weight Control your diabetes Keep your blood pressure down  If you have any questions, please call the office at [phone]  

## 2021-04-17 NOTE — Evaluation (Signed)
Physical Therapy Evaluation Patient Details Name: Michael [name] MRN: [id number] DOB: 1950/09/23 Today's Date: 04/17/2021  History of Present Illness  Pt adm 2/15 with rt leg claudication. Pt underwent rt femoral to AK popliteal bypass with vein and CFA enarterectomy on 2/15. PMH -  HTN, PAD, prostate CA, DM, sleep apnea.  Clinical Impression  Pt doing well with mobility and no further PT needed.  Ready for dc from PT standpoint.        Recommendations for follow up therapy are one component of a multi-disciplinary discharge planning process, led by the attending physician.  Recommendations may be updated based on patient status, additional functional criteria and insurance authorization.  Follow Up Recommendations No PT follow up    Assistance Recommended at Discharge None  Patient can return home with the following       Equipment Recommendations None recommended by PT  Recommendations for Other Services       Functional Status Assessment Patient has not had a recent decline in their functional status     Precautions / Restrictions Precautions Precautions: None      Mobility  Bed Mobility Overal bed mobility: Needs Assistance Bed Mobility: Supine to Sit     Supine to sit: Min assist     General bed mobility comments: Assist for pt to pull up on my hand    Transfers Overall transfer level: Modified independent Equipment used: None Transfers: Sit to/from Stand Sit to Stand: Modified independent (Device/Increase time)                Ambulation/Gait Ambulation/Gait assistance: Modified independent (Device/Increase time) Gait Distance (Feet): 300 Feet Assistive device: None Gait Pattern/deviations: Step-through pattern, Antalgic, Decreased stance time - right Gait velocity: decr Gait velocity interpretation: >2.62 ft/sec, indicative of community ambulatory   General Gait Details: Steady gait without assistive device. Used wall rail toward in for  comfort  Stairs Stairs: Yes Stairs assistance: Modified independent (Device/Increase time) Stair Management: One rail Right, Step to pattern, Forwards Number of Stairs: 5 General stair comments: Steady on stairs with rail  Wheelchair Mobility    Modified Rankin (Stroke Patients Only)       Balance Overall balance assessment: Mild deficits observed, not formally tested                                           Pertinent Vitals/Pain Pain Assessment Pain Assessment: Faces Faces Pain Scale: Hurts little more Pain Location: rt groin/leg Pain Descriptors / Indicators: Operative site guarding, Sore Pain Intervention(s): Monitored during session    Home Living Family/patient expects to be discharged to:: Private residence Living Arrangements: Spouse/significant other Available Help at Discharge: Family Type of Home: House Home Access: Stairs to enter Entrance Stairs-Rails: Psychiatric nurse of Steps: 12   Home Layout: One level Home Equipment: None      Prior Function Prior Level of Function : Independent/Modified Independent;Working/employed;Driving             Mobility Comments: No assistive device. 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       Hand Dominance   Dominant Hand: Right    Extremity/Trunk Assessment   Upper Extremity Assessment Upper Extremity Assessment: Defer to OT evaluation    Lower Extremity Assessment Lower Extremity Assessment: RLE deficits/detail RLE Deficits / Details: slightly limited by post op soreness  Communication   Communication: No difficulties  Cognition Arousal/Alertness: Awake/alert Behavior During Therapy: WFL for tasks assessed/performed Overall Cognitive Status: Within Functional Limits for tasks assessed                                          General Comments General comments (skin integrity, edema, etc.): VSS on RA. SpO2 96% after  amb    Exercises     Assessment/Plan    PT Assessment Patient does not need any further PT services  PT Problem List         PT Treatment Interventions      PT Goals (Current goals can be found in the Care Plan section)  Acute Rehab PT Goals Patient Stated Goal: To be able to take garbage cans up/down his driveway PT Goal Formulation: All assessment and education complete, DC therapy    Frequency       Co-evaluation               AM-PAC PT "6 Clicks" Mobility  Outcome Measure Help needed turning from your back to your side while in a flat bed without using bedrails?: None Help needed moving from lying on your back to sitting on the side of a flat bed without using bedrails?: A Little Help needed moving to and from a bed to a chair (including a wheelchair)?: None Help needed standing up from a chair using your arms (e.g., wheelchair or bedside chair)?: None Help needed to walk in hospital room?: None Help needed climbing 3-5 steps with a railing? : None 6 Click Score: 23    End of Session   Activity Tolerance: Patient tolerated treatment well Patient left: in bed (sitting EOB) Nurse Communication: Mobility status PT Visit Diagnosis: Other abnormalities of gait and mobility (R26.89)    Time: 3716-9678 PT Time Calculation (min) (ACUTE ONLY): 18 min   Charges:   PT Evaluation $PT Eval Low Complexity: 1 Low          [place] Pager [phone] Office [place] 04/17/2021, 10:47 AM

## 2021-04-17 NOTE — Plan of Care (Signed)
°  Problem: Clinical Measurements: Goal: Will remain free from infection Outcome: Progressing Goal: Respiratory complications will improve Outcome: Progressing Goal: Cardiovascular complication will be avoided Outcome: Progressing   Problem: Nutrition: Goal: Adequate nutrition will be maintained Outcome: Not Progressing

## 2021-04-17 NOTE — Progress Notes (Signed)
°  Transition of Care [facility]) Screening Note   Patient Details  Name: Michael [name] Date of Birth: [birth year]   [facility] CM/SW Contact:    Dawayne Patricia, RN Phone Number: 04/17/2021, 4:38 PM    Transition of Care Department [facility]) has reviewed patient and no TOC needs have been identified at this time. We will continue to monitor patient advancement through interdisciplinary progression rounds. If new patient transition needs arise, please place a TOC consult.

## 2021-04-17 NOTE — Evaluation (Signed)
Occupational Therapy Evaluation Patient Details Name: Michael [name] MRN: [id number] DOB: Apr 03, 1950 Today's Date: 04/17/2021   History of Present Illness Pt adm 2/15 with rt leg claudication. Pt underwent rt femoral to AK popliteal bypass with vein and CFA enarterectomy on 2/15. PMH -  HTN, PAD, prostate CA, DM, sleep apnea.   Clinical Impression   Patient admitted for the procedure above.  PTA he was working part time, and needed no assist for any aspect of mobility, ADL, or IADL.  He is very close to his baseline, and as surgica; pain subsides, he should return to baseline very quickly.  No further needs in the acute setting.        Recommendations for follow up therapy are one component of a multi-disciplinary discharge planning process, led by the attending physician.  Recommendations may be updated based on patient status, additional functional criteria and insurance authorization.   Follow Up Recommendations  No OT follow up    Assistance Recommended at Discharge PRN  Patient can return home with the following      Functional Status Assessment  Patient has not had a recent decline in their functional status  Equipment Recommendations  None recommended by OT    Recommendations for Other Services       Precautions / Restrictions Precautions Precautions: None Restrictions Weight Bearing Restrictions: No Other Position/Activity Restrictions: Wound vac      Mobility Bed Mobility   Bed Mobility: Sit to Supine       Sit to supine: Modified independent (Device/Increase time)        Transfers Overall transfer level: Modified independent Equipment used: None               General transfer comment: patient returning to bed from bathroom      Balance Overall balance assessment: Mild deficits observed, not formally tested                                         ADL either performed or assessed with clinical judgement   ADL Overall  ADL's : At baseline                                       General ADL Comments: soreness noted to R leg with flexion     Vision Patient Visual Report: No change from baseline       Perception Perception Perception: Not tested   Praxis Praxis Praxis: Not tested    Pertinent Vitals/Pain Pain Assessment Faces Pain Scale: Hurts even more Pain Location: rt groin/leg Pain Descriptors / Indicators: Operative site guarding, Sore Pain Intervention(s): Monitored during session     Hand Dominance Right   Extremity/Trunk Assessment Upper Extremity Assessment Upper Extremity Assessment: Overall WFL for tasks assessed   Lower Extremity Assessment Lower Extremity Assessment: Defer to PT evaluation   Cervical / Trunk Assessment Cervical / Trunk Assessment: Normal   Communication Communication Communication: No difficulties   Cognition Arousal/Alertness: Awake/alert Behavior During Therapy: WFL for tasks assessed/performed Overall Cognitive Status: Within Functional Limits for tasks assessed                                       General Comments  Exercises     Shoulder Instructions      Home Living Family/patient expects to be discharged to:: Private residence Living Arrangements: Spouse/significant other Available Help at Discharge: Family;Available 24 hours/day Type of Home: House Home Access: Stairs to enter   Entrance Stairs-Rails: Right;Left Home Layout: One level     Bathroom Shower/Tub: Occupational psychologist: Standard     Home Equipment: None          Prior Functioning/Environment                 ADLs Comments: No assist with ADL/IADL        OT Problem List: Pain      OT Treatment/Interventions:      OT Goals(Current goals can be found in the care plan section) Acute Rehab OT Goals Patient Stated Goal: Hoping to go home soon OT Goal Formulation: With patient Time For Goal Achievement:  04/24/21 Potential to Achieve Goals: Good  OT Frequency:      Co-evaluation              AM-PAC OT "6 Clicks" Daily Activity     Outcome Measure Help from another person eating meals?: None Help from another person taking care of personal grooming?: None Help from another person toileting, which includes using toliet, bedpan, or urinal?: None Help from another person bathing (including washing, rinsing, drying)?: None Help from another person to put on and taking off regular upper body clothing?: None Help from another person to put on and taking off regular lower body clothing?: None 6 Click Score: 24   End of Session    Activity Tolerance: Patient tolerated treatment well Patient left: in bed;with call bell/phone within reach  OT Visit Diagnosis: Unsteadiness on feet (R26.81);Pain Pain - Right/Left: Right Pain - part of body: Leg                Time: 8502-7741 OT Time Calculation (min): 21 min Charges:  OT General Charges $OT Visit: 1 Visit OT Evaluation $OT Eval Moderate Complexity: 1 Mod  04/17/2021  RP, OTR/L  Acute Rehabilitation Services  Office:  [phone]   Metta Clines 04/17/2021, 2:35 PM

## 2021-04-17 NOTE — Progress Notes (Signed)
Patient requesting 2mg  Klonopin instead of 1mg  for bedtime. Paged MD.  Daymon Larsen, RN

## 2021-04-17 NOTE — Progress Notes (Signed)
Mobility Specialist Progress Note   04/17/21 1810  Mobility  Activity Ambulated independently in hallway  Level of Assistance Standby assist, set-up cues, supervision of patient - no hands on  Assistive Device  (IV Pole)  Distance Ambulated (ft) 380 ft  Activity Response Tolerated well  $Mobility charge 1 Mobility   Received pt in doorway requesting to walk having no present symptoms. Pt ambulated well but was limited to pain tolerance at incision site. Returned back to EOB w/ needs met and call bell in reach.    Holland Falling Mobility Specialist Phone Number [phone]

## 2021-04-17 NOTE — Progress Notes (Addendum)
°  Progress Note    04/17/2021 7:50 AM 1 Day Post-Op  Subjective:  R foot feels much better today compared to preop   Vitals:   04/16/21 2259 04/17/21 0359  BP: (!) 145/69 137/71  Pulse: 81 86  Resp: 16 20  Temp: 97.7 F (36.5 C) 98 F (36.7 C)  SpO2: 99% 99%   Physical Exam: Lungs:  non labored Incisions:  RLE incisions c/d/I with local ecchymosis; prevena with good seal R groin Extremities:  brisk R PTA by doppler Abdomen:  soft, Neurologic: A&O  CBC    Component Value Date/Time   WBC 9.7 04/17/2021 0139   RBC 3.91 (L) 04/17/2021 0139   HGB 13.3 04/17/2021 0139   HCT 39.4 04/17/2021 0139   PLT 139 (L) 04/17/2021 0139   MCV 100.8 (H) 04/17/2021 0139   MCH 34.0 04/17/2021 0139   MCHC 33.8 04/17/2021 0139   RDW 13.4 04/17/2021 0139    BMET    Component Value Date/Time   NA 138 04/17/2021 0139   NA 145 (H) 05/29/2020 1304   K 4.1 04/17/2021 0139   CL 105 04/17/2021 0139   CO2 23 04/17/2021 0139   GLUCOSE 174 (H) 04/17/2021 0139   BUN 15 04/17/2021 0139   BUN 16 05/29/2020 1304   CREATININE 0.86 04/17/2021 0139   CALCIUM 8.6 (L) 04/17/2021 0139   GFRNONAA >60 04/17/2021 0139   GFRAA 107 12/02/2017 1100    INR    Component Value Date/Time   INR 0.9 04/14/2021 1046     Intake/Output Summary (Last 24 hours) at 04/17/2021 0750 Last data filed at 04/17/2021 5188 Gross per 24 hour  Intake 3379.72 ml  Output 3800 ml  Net -420.28 ml     Assessment/Plan:  71 y.o. male is s/p R femoral to AK pop bypass with vein and CFA endarterectomy 1 Day Post-Op   Subjectively R foot feels better; brisk PTA by doppler Continue R groin prevena until battery runs out; educated patient on how to remove this at home OOB this morning Discontinued novolog and added semglee per diabetes coordinator Probable discharge home tomorrow morning   Dagoberto Ligas, PA-C [facility] [phone] 04/17/2021 7:50 AM  I agree with the above.  He is  postoperative day 1 from a right femoral-popliteal bypass graft.  He has a brisk peroneal Doppler signal.  His incisions are healing nicely.  He remains on Toradol, morphine, and hydrocodone for pain control.  He had significant nausea yesterday but that has improved.  The plan is for him to work with physical therapy today.  If he does well, I will consider discharge home tomorrow.  I appreciate assistance from the diabetes coordinator regarding managing his challenging blood sugar.  Annamarie Major

## 2021-04-18 LAB — POCT I-STAT, CHEM 8
BUN: 16 mg/dL (ref 8–23)
Calcium, Ion: 1.26 mmol/L (ref 1.15–1.40)
Chloride: 103 mmol/L (ref 98–111)
Creatinine, Ser: 0.9 mg/dL (ref 0.61–1.24)
Glucose, Bld: 178 mg/dL — ABNORMAL HIGH (ref 70–99)
HCT: 38 % — ABNORMAL LOW (ref 39.0–52.0)
Hemoglobin: 12.9 g/dL — ABNORMAL LOW (ref 13.0–17.0)
Potassium: 4 mmol/L (ref 3.5–5.1)
Sodium: 141 mmol/L (ref 135–145)
TCO2: 27 mmol/L (ref 22–32)

## 2021-04-18 LAB — GLUCOSE, CAPILLARY
Glucose-Capillary: 254 mg/dL — ABNORMAL HIGH (ref 70–99)
Glucose-Capillary: 201 mg/dL — ABNORMAL HIGH (ref 70–99)

## 2021-04-18 MED ORDER — HYDROCODONE-ACETAMINOPHEN 5-325 MG PO TABS: 1.0000 | ORAL_TABLET | Freq: Four times a day (QID) | ORAL | 0 refills | Status: DC | PRN

## 2021-04-18 NOTE — TOC Transition Note (Addendum)
Transition of Care [facility]) - CM/SW Discharge Note   Patient Details  Name: Michael [name] MRN: [id number] Date of Birth: [birth year]  Transition [facility] CM/SW Contact:  Zenon Mayo, RN Phone Number: 04/18/2021, 1:52 PM   Clinical Narrative:    Patient is for dc , no needs identified.  Patient has a preveena wound vac, per MD note, he is to  leave it on til battery runs out, and he has been instructed on how to remove it.         Patient Goals and CMS Choice        Discharge Placement                       Discharge Plan and Services                                     Social Determinants of Health (SDOH) Interventions     Readmission Risk Interventions No flowsheet data found.

## 2021-04-18 NOTE — Progress Notes (Signed)
°  Progress Note    04/18/2021 1:33 PM 2 Days Post-Op  Subjective:  Ambulating. No complaints   Vitals:   04/18/21 1032 04/18/21 1146  BP: 132/73 129/70  Pulse:  95  Resp:  18  Temp:  97.7 F (36.5 C)  SpO2:  100%   Physical Exam: Lungs:  non labored Incisions:  RLE incisions c/d/I with local ecchymosis; prevena with good seal R groin  - right thigh with some serous drainage from incision, no errythema Extremities:  brisk R PTA by doppler Abdomen:  soft, Neurologic: A&O  CBC    Component Value Date/Time   WBC 9.7 04/17/2021 0139   RBC 3.91 (L) 04/17/2021 0139   HGB 13.3 04/17/2021 0139   HCT 39.4 04/17/2021 0139   PLT 139 (L) 04/17/2021 0139   MCV 100.8 (H) 04/17/2021 0139   MCH 34.0 04/17/2021 0139   MCHC 33.8 04/17/2021 0139   RDW 13.4 04/17/2021 0139    BMET    Component Value Date/Time   NA 138 04/17/2021 0139   NA 145 (H) 05/29/2020 1304   K 4.1 04/17/2021 0139   CL 105 04/17/2021 0139   CO2 23 04/17/2021 0139   GLUCOSE 174 (H) 04/17/2021 0139   BUN 15 04/17/2021 0139   BUN 16 05/29/2020 1304   CREATININE 0.86 04/17/2021 0139   CALCIUM 8.6 (L) 04/17/2021 0139   GFRNONAA >60 04/17/2021 0139   GFRAA 107 12/02/2017 1100    INR    Component Value Date/Time   INR 0.9 04/14/2021 1046     Intake/Output Summary (Last 24 hours) at 04/18/2021 1333 Last data filed at 04/18/2021 0327 Gross per 24 hour  Intake 240 ml  Output 202 ml  Net 38 ml      Assessment/Plan:  71 y.o. male is s/p R femoral to AK pop bypass with vein and CFA endarterectomy 2 Days Post-Op   Subjectively R foot feels better; brisk PTA by doppler Continue R groin prevena until battery runs out; educated patient on how to remove this at home OOB this morning Discontinued novolog and added semglee per diabetes coordinator Dry dressing to thigh with light ace to hold in place. Instructed to call our office with any questions or concerns.  D/c today   Broadus John  [facility] [phone] 04/18/2021 1:33 PM

## 2021-04-18 NOTE — Progress Notes (Signed)
Inpatient Diabetes Program Recommendations  AACE/ADA: New Consensus Statement on Inpatient Glycemic Control  Target Ranges:  Prepandial:   less than 140 mg/dL      Peak postprandial:   less than 180 mg/dL (1-2 hours)      Critically ill patients:  140 - 180 mg/dL    Latest Reference Range & Units 04/17/21 06:27 04/17/21 11:52 04/17/21 17:08 04/18/21 07:07  Glucose-Capillary 70 - 99 mg/dL 240 (H) 215 (H) 250 (H) 254 (H)   Review of Glycemic Control  Diabetes history: DM2 Outpatient Diabetes medications: Lantus 16 units QHS, Farxiga 5 mg daily, Trulicity 1.5 mg Qweek, Humalog 34 units BID, Metformin XR 2000 mg QHS Current orders for Inpatient glycemic control: Semglee 10 units QHS, Novolog 0-20 units TID with meals, Farxiga 5 mg daily, Metformin XR 2000 mg QHS   Inpatient Diabetes Program Recommendations:     Insulin: Please consider increasing Semglee 13 units QHS and ordering Novolog 3 units TID with meals for meal coverage if patient eats at least 50% of meals.   Thanks, Barnie Alderman, RN, MSN, CDE Diabetes Coordinator Inpatient Diabetes Program [phone] (Team Pager from 8am to 5pm)

## 2021-04-18 NOTE — Care Management Important Message (Signed)
Important Message  Patient Details  Name: Michael [name] MRN: [id number] Date of Birth: [birth year]   Medicare Important Message Given:  Yes     Shelda Altes 04/18/2021, 9:39 AM

## 2021-04-18 NOTE — Discharge Summary (Signed)
Discharge Summary     Michael [name] 1950/08/08 71 y.o. male  [id number]  Admission Date: 04/16/2021  Discharge Date: 04/18/2021  Physician: Serafina Mitchell, MD  Admission Diagnosis: PAD (peripheral artery disease) ([place]) [I73.9]  HPI:   This is a 71 y.o. male who returns today for follow-up of his right leg claudication.  He has recently undergone balloon angioplasty of the right superficial femoral artery for high-grade stenosis.  Unfortunately, he had a early occlusion.  He continues to have symptoms of short distance claudication.  He has not with ulcers.  He suffers from diabetes and hypertension.  He is on a statin for hypercholesterolemia.    Hospital Course:  The patient was admitted to the hospital and taken to the operating room on 04/16/2021 and underwent: #1:  right femoral above knee popliteal bypass with non-reversed ipsilateral saphenous vein #2:  right common femoral and profunda femoral endarterectomy #3:  Provena wound vac to right groin incision    Findings: extensive circumferential calcification of the common femoral and profunda femoral arteries requiring endarterectomy.  I spatulated the proximal anastamosis to serve as a patch.  The vein was 3-4 mm in uniform diameter  The pt tolerated the procedure well and was transported to the PACU in good condition.   By POD 1, He has a brisk peroneal Doppler signal.  His incisions are healing nicely.  He remains on Toradol, morphine, and hydrocodone for pain control.  He had significant nausea yesterday but that has improved.  The plan is for him to work with physical therapy today.    POD 2, his foot was feeling better with a brisk PT doppler signal.  Pt will keep Prevena in place until battery runs out.  Pt is discharged home.  Pt has allergy listed to [place], however, he has been receiving this in the hospital without issue and therefore discharged home with Rx.    CBC    Component Value Date/Time   WBC 9.7  04/17/2021 0139   RBC 3.91 (L) 04/17/2021 0139   HGB 13.3 04/17/2021 0139   HCT 39.4 04/17/2021 0139   PLT 139 (L) 04/17/2021 0139   MCV 100.8 (H) 04/17/2021 0139   MCH 34.0 04/17/2021 0139   MCHC 33.8 04/17/2021 0139   RDW 13.4 04/17/2021 0139    BMET    Component Value Date/Time   NA 138 04/17/2021 0139   NA 145 (H) 05/29/2020 1304   K 4.1 04/17/2021 0139   CL 105 04/17/2021 0139   CO2 23 04/17/2021 0139   GLUCOSE 174 (H) 04/17/2021 0139   BUN 15 04/17/2021 0139   BUN 16 05/29/2020 1304   CREATININE 0.86 04/17/2021 0139   CALCIUM 8.6 (L) 04/17/2021 0139   GFRNONAA >60 04/17/2021 0139   GFRAA 107 12/02/2017 1100     Discharge Instructions     Discharge patient   Complete by: As directed    Discharge disposition: 01-Home or Self Care   Discharge patient date: 04/18/2021       Discharge Diagnosis:  PAD (peripheral artery disease) ([place]) [I73.9]  Secondary Diagnosis: Patient Active Problem List   Diagnosis Date Noted   PAD (peripheral artery disease) ([place]) 04/16/2021   Malignant neoplasm of prostate ([place]) 03/29/2018   Diabetes mellitus, type 2 ([place]) 01/29/2015   Obesity (BMI 30-39.9) 01/29/2015   Essential hypertension, benign 01/13/2013   Peripheral vascular disease ([place]) 01/13/2013   Research study patient 06/19/2011   Atherosclerosis of native arteries of the extremities with intermittent claudication  11/24/2010   Past Medical History:  Diagnosis Date   Allergy    Anxiety    Diabetes mellitus    GERD (gastroesophageal reflux disease)    Hyperlipidemia    Hypertension    PAD (peripheral artery disease) ([place])    a. s/p prior LE stenting 2012, 03/2017 - followed by [facility].   Prostate cancer ([place])    Sleep apnea 2016   wears CPAP sometimes     Allergies as of 04/18/2021       Reactions   Demerol Anaphylaxis   Lactose Intolerance (gi) Anaphylaxis   Oxycodone Anxiety, Other (See Comments)   Suicidal ideation   Penicillins Anaphylaxis, Other (See  Comments)   Actos [pioglitazone] Nausea Only   Exenatide Nausea Only, Other (See Comments)   Glimepiride Other (See Comments)   Edema in ankles   Nabumetone Nausea Only, Other (See Comments)   Victoza [liraglutide] Nausea Only   Shaking   Fentanyl Anxiety   Jittery    Hydrocodone Anxiety   Suicidal ideation        Medication List     TAKE these medications    Accu-Chek Aviva Plus test strip Generic drug: glucose blood AS DIRECTED SEVEN TIMES A DAY IN VITRO 90 DAYS   amLODipine 10 MG tablet Commonly known as: NORVASC Take 10 mg by mouth daily.   atorvastatin 40 MG tablet Commonly known as: LIPITOR Take 40 mg by mouth at bedtime.   benazepril 40 MG tablet Commonly known as: LOTENSIN Take 40 mg by mouth daily.   clonazePAM 1 MG tablet Commonly known as: KLONOPIN Take 1 mg by mouth at bedtime as needed (sleep).   clopidogrel 75 MG tablet Commonly known as: PLAVIX TAKE 1 TABLET BY MOUTH EVERY DAY WITH BREAKFAST   dapagliflozin propanediol 5 MG Tabs tablet Commonly known as: FARXIGA 5 mg daily.   Dulaglutide 1.5 MG/0.5ML Sopn Inject 1.5 mg into the skin once a week.   famotidine 20 MG tablet Commonly known as: PEPCID Take 20 mg by mouth 2 (two) times daily.   fluticasone 50 MCG/ACT nasal spray Commonly known as: FLONASE Place 2 sprays into both nostrils at bedtime as needed for allergies (before uses CPAP if needed).   folic acid 1 MG tablet Commonly known as: FOLVITE Take 1 mg by mouth daily.   FreeStyle Libre 14 Day Reader Kerrin Mo use to monitor blood sugar   HumaLOG KwikPen 100 UNIT/ML KwikPen Generic drug: insulin lispro Inject 34 Units into the skin 2 (two) times daily.   hydrochlorothiazide 25 MG tablet Commonly known as: HYDRODIURIL Take 1 tablet (25 mg total) by mouth daily. Please make overdue appt with Dr. Irish Lack before anymore refills. 2nd attempt   HYDROcodone-acetaminophen 5-325 MG tablet Commonly known as: Norco Take 1 tablet by  mouth every 6 (six) hours as needed for moderate pain.   ibuprofen 200 MG tablet Commonly known as: ADVIL Take 400-800 mg by mouth daily as needed for headache.   insulin glargine 100 UNIT/ML injection Commonly known as: LANTUS Inject 16 Units into the skin at bedtime.   metFORMIN 500 MG 24 hr tablet Commonly known as: GLUCOPHAGE-XR Take 2,000 mg by mouth at bedtime.   methotrexate 2.5 MG tablet Take 15 mg by mouth every Tuesday.   metoprolol succinate 100 MG 24 hr tablet Commonly known as: TOPROL-XL Take 100 mg by mouth daily.   naproxen sodium 220 MG tablet Commonly known as: ALEVE Take 440 mg by mouth daily.   solifenacin 5 MG tablet Commonly  known as: VESICARE Take 5 mg by mouth daily.   tamsulosin 0.4 MG Caps capsule Commonly known as: FLOMAX Take 0.4 mg by mouth at bedtime.        Discharge Instructions: [facility] Discharge instructions Lower Extremity Bypass Surgery  Please refer to the following instruction for your post-procedure care. Your surgeon or physician assistant will discuss any changes with you.  Activity  You are encouraged to walk as much as you can. You can slowly return to normal activities during the month after your surgery. Avoid strenuous activity and heavy lifting until your doctor tells you it's OK. Avoid activities such as vacuuming or swinging a golf club. Do not drive until your doctor give the OK and you are no longer taking prescription pain medications. It is also normal to have difficulty with sleep habits, eating and bowel movement after surgery. These will go away with time.  Bathing/Showering  You may shower after you go home. Do not soak in a bathtub, hot tub, or swim until the incision heals completely.  Incision Care  Clean your incision with mild soap and water. Shower every day. Pat the area dry with a clean towel. You do not need a bandage unless otherwise instructed. Do not apply any  ointments or creams to your incision. If you have open wounds you will be instructed how to care for them or a visiting nurse may be arranged for you. If you have staples or sutures along your incision they will be removed at your post-op appointment. You may have skin glue on your incision. Do not peel it off. It will come off on its own in about one week.  Wash the groin wound with soap and water daily and pat dry. (No tub bath-only shower)  Then put a dry gauze or washcloth in the groin to keep this area dry to help prevent wound infection.  Do this daily and as needed.  Do not use Vaseline or neosporin on your incisions.  Only use soap and water on your incisions and then protect and keep dry.  Diet  Resume your normal diet. There are no special food restrictions following this procedure. A low fat/ low cholesterol diet is recommended for all patients with vascular disease. In order to heal from your surgery, it is CRITICAL to get adequate nutrition. Your body requires vitamins, minerals, and protein. Vegetables are the best source of vitamins and minerals. Vegetables also provide the perfect balance of protein. Processed food has little nutritional value, so try to avoid this.  Medications  Resume taking all your medications unless your doctor or Physician Assistant tells you not to. If your incision is causing pain, you may take over-the-counter pain relievers such as acetaminophen (Tylenol). If you were prescribed a stronger pain medication, please aware these medication can cause nausea and constipation. Prevent nausea by taking the medication with a snack or meal. Avoid constipation by drinking plenty of fluids and eating foods with high amount of fiber, such as fruits, vegetables, and grains. Take Colace 100 mg (an over-the-counter stool softener) twice a day as needed for constipation.  Do not take Tylenol if you are taking prescription pain medications.  Follow Up  Our office will schedule  a follow up appointment 2-3 weeks following discharge.  Please call us immediately for any of the following conditions  Severe or worsening pain in your legs or feet while at rest or while walking Increase pain, redness, warmth, or drainage (  pus) from your incision site(s) Fever of 101 degree or higher The swelling in your leg with the bypass suddenly worsens and becomes more painful than when you were in the hospital If you have been instructed to feel your graft pulse then you should do so every day. If you can no longer feel this pulse, call the office immediately. Not all patients are given this instruction.  Leg swelling is common after leg bypass surgery.  The swelling should improve over a few months following surgery. To improve the swelling, you may elevate your legs above the level of your heart while you are sitting or resting. Your surgeon or physician assistant may ask you to apply an ACE wrap or wear compression (TED) stockings to help to reduce swelling.  Reduce your risk of vascular disease  Stop smoking. If you would like help call [facility] at 1-800-QUIT-NOW 367-522-8331) or [place] at [phone].  Manage your cholesterol Maintain a desired weight Control your diabetes weight Control your diabetes Keep your blood pressure down  If you have any questions, please call the office at [phone]   Prescriptions given: 1.  Norco#30 No Refill  Disposition: home  Patient's condition: is Good  Follow up: 1. [facility] in 2-3 weeks   Leontine Locket, PA-C [facility] [phone] 04/18/2021  1:57 PM  - For VQI Registry use ---   Post-op:  Wound infection: No  Graft infection: No  Transfusion: No    If yes, n/a units given New Arrhythmia: No Ipsilateral amputation: No, [ ]  Minor, [ ]  BKA, [ ]  AKA Discharge patency: [x ] Primary, [ ]  Primary assisted, [ ]  Secondary, [ ]  Occluded Patency judged by: [x ] Dopper only, [ ]  Palpable graft  pulse, []  Palpable distal pulse, [ ]  ABI inc. > 0.15, [ ]  Duplex Discharge ABI: R not done, L  D/C Ambulatory Status: Ambulatory  Complications: MI: No, [ ]  Troponin only, [ ]  EKG or Clinical CHF: No Resp failure:No, [ ]  Pneumonia, [ ]  Ventilator Chg in renal function: No, [ ]  Inc. Cr > 0.5, [ ]  Temp. Dialysis,  [ ]  Permanent dialysis Stroke: No, [ ]  Minor, [ ]  Major Return to OR: No  Reason for return to OR: [ ]  Bleeding, [ ]  Infection, [ ]  Thrombosis, [ ]  Revision  Discharge medications: Statin use:  yes ASA use:  no Plavix use:  yes Beta blocker use: no CCB use:  Yes ACEI use:   yes ARB use:  no Coumadin use: no

## 2021-04-18 NOTE — Progress Notes (Signed)
Patient has pain with saline flush to his IV. Removed IV for likely phlebitis. Patient refuses new IV restart as he anticipates discharge in the A.M.  Patient advised of need for immediate vascular access and the potential problems posed by not having access. Patient says he will agree to an IV restart if he is not discharged in the A.M. Will continue to monitor.

## 2021-04-18 NOTE — Progress Notes (Signed)
Mobility Specialist Progress Note   04/18/21 1235  Mobility  Activity Ambulated independently in hallway  Level of Assistance Modified independent, requires aide device or extra time  Assistive Device None  Distance Ambulated (ft) 270 ft  Activity Response Tolerated well  $Mobility charge 1 Mobility   Received pt in doorway having no complaints and agreeable to mobility. Asymptomatic throughout ambulation, returned back to room w/ call bell in reach and all needs met and eager to go home.  Holland Falling Mobility Specialist Phone Number [phone]

## 2021-04-24 ENCOUNTER — Telehealth: Payer: Self-pay | Admitting: *Deleted

## 2021-04-24 ENCOUNTER — Encounter: Payer: Self-pay | Admitting: *Deleted

## 2021-04-24 ENCOUNTER — Other Ambulatory Visit: Payer: Self-pay | Admitting: Physician Assistant

## 2021-04-24 DIAGNOSIS — I70211 Atherosclerosis of native arteries of extremities with intermittent claudication, right leg: Secondary | ICD-10-CM

## 2021-04-24 MED ORDER — OXYCODONE-ACETAMINOPHEN 5-325 MG PO TABS: 1.0000 | ORAL_TABLET | Freq: Four times a day (QID) | ORAL | 0 refills | Status: DC | PRN

## 2021-04-24 NOTE — Telephone Encounter (Signed)
Patient called and left message stating the pain medication he was Rx'd was not working well. He states he is waking up every hour with aching pain. His procedure was done 04/15/21. Spoke with Gwenette Greet PA she sent in percocet for patient to his pharmacy. Called patient 2 times only got voice mail. Left message letting patient know we sent in new RX also to go to ED if he developed increased swelling, redness,fever or if leg feels cold.

## 2021-04-28 ENCOUNTER — Inpatient Hospital Stay (HOSPITAL_COMMUNITY)
Admission: RE | Admit: 2021-04-28 | Discharge: 2021-05-07 | DRG: 857 | Disposition: A | Discharge status: Home Health | Payer: Medicare Other | Source: Ambulatory Visit | Attending: Surgery | Admitting: Surgery

## 2021-04-28 ENCOUNTER — Other Ambulatory Visit: Payer: Self-pay | Admitting: Physician Assistant

## 2021-04-28 ENCOUNTER — Other Ambulatory Visit: Payer: Self-pay

## 2021-04-28 ENCOUNTER — Encounter: Payer: Self-pay | Admitting: Surgery

## 2021-04-28 ENCOUNTER — Ambulatory Visit (INDEPENDENT_AMBULATORY_CARE_PROVIDER_SITE_OTHER): Payer: Medicare Other | Admitting: Surgery

## 2021-04-28 VITALS — BP 116/64 | HR 89 | Temp 97.6°F | Resp 20 | Ht 72.0 in | Wt 222.0 lb

## 2021-04-28 DIAGNOSIS — E1165 Type 2 diabetes mellitus with hyperglycemia: Secondary | ICD-10-CM | POA: Diagnosis present

## 2021-04-28 DIAGNOSIS — Z8546 Personal history of malignant neoplasm of prostate: Secondary | ICD-10-CM | POA: Diagnosis not present

## 2021-04-28 DIAGNOSIS — K219 Gastro-esophageal reflux disease without esophagitis: Secondary | ICD-10-CM | POA: Diagnosis not present

## 2021-04-28 DIAGNOSIS — I10 Essential (primary) hypertension: Secondary | ICD-10-CM | POA: Diagnosis not present

## 2021-04-28 DIAGNOSIS — E119 Type 2 diabetes mellitus without complications: Secondary | ICD-10-CM

## 2021-04-28 DIAGNOSIS — Z95828 Presence of other vascular implants and grafts: Secondary | ICD-10-CM

## 2021-04-28 DIAGNOSIS — Z7984 Long term (current) use of oral hypoglycemic drugs: Secondary | ICD-10-CM | POA: Diagnosis not present

## 2021-04-28 DIAGNOSIS — Z7902 Long term (current) use of antithrombotics/antiplatelets: Secondary | ICD-10-CM | POA: Diagnosis not present

## 2021-04-28 DIAGNOSIS — N179 Acute kidney failure, unspecified: Secondary | ICD-10-CM | POA: Diagnosis not present

## 2021-04-28 DIAGNOSIS — G4733 Obstructive sleep apnea (adult) (pediatric): Secondary | ICD-10-CM | POA: Diagnosis present

## 2021-04-28 DIAGNOSIS — E871 Hypo-osmolality and hyponatremia: Secondary | ICD-10-CM

## 2021-04-28 DIAGNOSIS — Z713 Dietary counseling and surveillance: Secondary | ICD-10-CM

## 2021-04-28 DIAGNOSIS — Y838 Other surgical procedures as the cause of abnormal reaction of the patient, or of later complication, without mention of misadventure at the time of the procedure: Secondary | ICD-10-CM | POA: Diagnosis present

## 2021-04-28 DIAGNOSIS — T8149XA Infection following a procedure, other surgical site, initial encounter: Secondary | ICD-10-CM | POA: Diagnosis present

## 2021-04-28 DIAGNOSIS — E669 Obesity, unspecified: Secondary | ICD-10-CM | POA: Diagnosis present

## 2021-04-28 DIAGNOSIS — D539 Nutritional anemia, unspecified: Secondary | ICD-10-CM | POA: Diagnosis not present

## 2021-04-28 DIAGNOSIS — I70211 Atherosclerosis of native arteries of extremities with intermittent claudication, right leg: Secondary | ICD-10-CM | POA: Diagnosis not present

## 2021-04-28 DIAGNOSIS — L03115 Cellulitis of right lower limb: Secondary | ICD-10-CM | POA: Diagnosis present

## 2021-04-28 DIAGNOSIS — E1152 Type 2 diabetes mellitus with diabetic peripheral angiopathy with gangrene: Secondary | ICD-10-CM | POA: Diagnosis not present

## 2021-04-28 DIAGNOSIS — Z20822 Contact with and (suspected) exposure to covid-19: Secondary | ICD-10-CM | POA: Diagnosis not present

## 2021-04-28 DIAGNOSIS — T8142XA Infection following a procedure, deep incisional surgical site, initial encounter: Secondary | ICD-10-CM | POA: Diagnosis not present

## 2021-04-28 DIAGNOSIS — Z79899 Other long term (current) drug therapy: Secondary | ICD-10-CM

## 2021-04-28 DIAGNOSIS — T8141XA Infection following a procedure, superficial incisional surgical site, initial encounter: Principal | ICD-10-CM | POA: Diagnosis present

## 2021-04-28 DIAGNOSIS — Z8249 Family history of ischemic heart disease and other diseases of the circulatory system: Secondary | ICD-10-CM

## 2021-04-28 DIAGNOSIS — F419 Anxiety disorder, unspecified: Secondary | ICD-10-CM | POA: Diagnosis not present

## 2021-04-28 DIAGNOSIS — Z885 Allergy status to narcotic agent status: Secondary | ICD-10-CM

## 2021-04-28 DIAGNOSIS — Z880 Allergy status to penicillin: Secondary | ICD-10-CM

## 2021-04-28 DIAGNOSIS — R531 Weakness: Secondary | ICD-10-CM | POA: Diagnosis not present

## 2021-04-28 DIAGNOSIS — R410 Disorientation, unspecified: Secondary | ICD-10-CM | POA: Diagnosis not present

## 2021-04-28 DIAGNOSIS — T368X5A Adverse effect of other systemic antibiotics, initial encounter: Secondary | ICD-10-CM | POA: Diagnosis present

## 2021-04-28 DIAGNOSIS — Z7985 Long-term (current) use of injectable non-insulin antidiabetic drugs: Secondary | ICD-10-CM

## 2021-04-28 DIAGNOSIS — Z87891 Personal history of nicotine dependence: Secondary | ICD-10-CM | POA: Diagnosis not present

## 2021-04-28 DIAGNOSIS — Z6831 Body mass index (BMI) 31.0-31.9, adult: Secondary | ICD-10-CM

## 2021-04-28 DIAGNOSIS — Z91011 Allergy to milk products: Secondary | ICD-10-CM

## 2021-04-28 DIAGNOSIS — I9789 Other postprocedural complications and disorders of the circulatory system, not elsewhere classified: Secondary | ICD-10-CM | POA: Diagnosis not present

## 2021-04-28 DIAGNOSIS — Z888 Allergy status to other drugs, medicaments and biological substances status: Secondary | ICD-10-CM

## 2021-04-28 DIAGNOSIS — Z794 Long term (current) use of insulin: Secondary | ICD-10-CM | POA: Diagnosis not present

## 2021-04-28 DIAGNOSIS — E785 Hyperlipidemia, unspecified: Secondary | ICD-10-CM | POA: Diagnosis not present

## 2021-04-28 DIAGNOSIS — T827XXA Infection and inflammatory reaction due to other cardiac and vascular devices, implants and grafts, initial encounter: Secondary | ICD-10-CM | POA: Diagnosis not present

## 2021-04-28 DIAGNOSIS — R799 Abnormal finding of blood chemistry, unspecified: Secondary | ICD-10-CM

## 2021-04-28 DIAGNOSIS — E1151 Type 2 diabetes mellitus with diabetic peripheral angiopathy without gangrene: Secondary | ICD-10-CM | POA: Diagnosis not present

## 2021-04-28 LAB — CBC
HCT: 35.2 % — ABNORMAL LOW (ref 39.0–52.0)
Hemoglobin: 12.3 g/dL — ABNORMAL LOW (ref 13.0–17.0)
MCH: 34.6 pg — ABNORMAL HIGH (ref 26.0–34.0)
MCHC: 34.9 g/dL (ref 30.0–36.0)
MCV: 98.9 fL (ref 80.0–100.0)
Platelets: 251 10*3/uL (ref 150–400)
RBC: 3.56 MIL/uL — ABNORMAL LOW (ref 4.22–5.81)
RDW: 13.4 % (ref 11.5–15.5)
WBC: 12 10*3/uL — ABNORMAL HIGH (ref 4.0–10.5)
nRBC: 0 % (ref 0.0–0.2)

## 2021-04-28 LAB — RESP PANEL BY RT-PCR (FLU A&B, COVID) ARPGX2
Influenza A by PCR: NEGATIVE
Influenza B by PCR: NEGATIVE
SARS Coronavirus 2 by RT PCR: NEGATIVE

## 2021-04-28 LAB — BASIC METABOLIC PANEL
Anion gap: 16 — ABNORMAL HIGH (ref 5–15)
BUN: 38 mg/dL — ABNORMAL HIGH (ref 8–23)
CO2: 20 mmol/L — ABNORMAL LOW (ref 22–32)
Calcium: 9.1 mg/dL (ref 8.9–10.3)
Chloride: 93 mmol/L — ABNORMAL LOW (ref 98–111)
Creatinine, Ser: 1.49 mg/dL — ABNORMAL HIGH (ref 0.61–1.24)
GFR, Estimated: 50 mL/min — ABNORMAL LOW (ref >60)
Glucose, Bld: 476 mg/dL — ABNORMAL HIGH (ref 70–99)
Potassium: 4.1 mmol/L (ref 3.5–5.1)
Sodium: 129 mmol/L — ABNORMAL LOW (ref 135–145)

## 2021-04-28 LAB — GLUCOSE, CAPILLARY: Glucose-Capillary: 365 mg/dL — ABNORMAL HIGH (ref 70–99)

## 2021-04-28 MED ORDER — BENAZEPRIL HCL 40 MG PO TABS
40.0000 mg | ORAL_TABLET | Freq: Every day | ORAL | Status: DC
  Administered 2021-04-29 – 2021-05-07 (×7): 40 mg via ORAL
  Filled 2021-04-28: qty 8
  Filled 2021-04-28 (×2): qty 1
  Filled 2021-04-28: qty 8
  Filled 2021-04-28 (×4): qty 1
  Filled 2021-04-28: qty 8

## 2021-04-28 MED ORDER — ALUM & MAG HYDROXIDE-SIMETH 200-200-20 MG/5ML PO SUSP
15.0000 mL | ORAL | Status: DC | PRN
  Administered 2021-05-03 – 2021-05-05 (×5): 30 mL via ORAL
  Filled 2021-04-28 (×5): qty 30

## 2021-04-28 MED ORDER — SODIUM CHLORIDE 0.9 % IV SOLN
2.0000 g | Freq: Once | INTRAVENOUS | Status: AC
  Administered 2021-04-28: 2 g via INTRAVENOUS
  Filled 2021-04-28: qty 2

## 2021-04-28 MED ORDER — ACETAMINOPHEN 650 MG RE SUPP: 325.0000 mg | RECTAL | Status: DC | PRN

## 2021-04-28 MED ORDER — CLOPIDOGREL BISULFATE 75 MG PO TABS
75.0000 mg | ORAL_TABLET | Freq: Every day | ORAL | Status: DC
  Administered 2021-04-29 – 2021-05-07 (×8): 75 mg via ORAL
  Filled 2021-04-28 (×9): qty 1

## 2021-04-28 MED ORDER — TAMSULOSIN HCL 0.4 MG PO CAPS
0.4000 mg | ORAL_CAPSULE | Freq: Every day | ORAL | Status: DC
Start: 2021-04-28 — End: 2021-05-07
  Administered 2021-04-28 – 2021-05-06 (×9): 0.4 mg via ORAL
  Filled 2021-04-28 (×9): qty 1

## 2021-04-28 MED ORDER — HYDROCHLOROTHIAZIDE 25 MG PO TABS
25.0000 mg | ORAL_TABLET | Freq: Every day | ORAL | Status: DC
  Administered 2021-04-29 – 2021-05-07 (×8): 25 mg via ORAL
  Filled 2021-04-28 (×8): qty 1

## 2021-04-28 MED ORDER — VANCOMYCIN HCL 2000 MG/400ML IV SOLN
2000.0000 mg | Freq: Once | INTRAVENOUS | Status: AC
  Administered 2021-04-28: 2000 mg via INTRAVENOUS
  Filled 2021-04-28: qty 400

## 2021-04-28 MED ORDER — GUAIFENESIN-DM 100-10 MG/5ML PO SYRP: 15.0000 mL | ORAL_SOLUTION | ORAL | Status: DC | PRN

## 2021-04-28 MED ORDER — AMLODIPINE BESYLATE 10 MG PO TABS
10.0000 mg | ORAL_TABLET | Freq: Every day | ORAL | Status: DC
  Administered 2021-04-29 – 2021-05-07 (×8): 10 mg via ORAL
  Filled 2021-04-28 (×8): qty 1

## 2021-04-28 MED ORDER — ACETAMINOPHEN 325 MG PO TABS
325.0000 mg | ORAL_TABLET | ORAL | Status: DC | PRN
  Administered 2021-04-29 – 2021-04-30 (×3): 650 mg via ORAL
  Filled 2021-04-28 (×3): qty 2

## 2021-04-28 MED ORDER — CLONAZEPAM 1 MG PO TABS
1.0000 mg | ORAL_TABLET | Freq: Every evening | ORAL | Status: DC | PRN
  Administered 2021-04-28 – 2021-05-04 (×7): 1 mg via ORAL
  Filled 2021-04-28 (×7): qty 1

## 2021-04-28 MED ORDER — SODIUM CHLORIDE 0.9 % IV SOLN: INTRAVENOUS | Status: DC

## 2021-04-28 MED ORDER — HYDROCODONE-ACETAMINOPHEN 5-325 MG PO TABS
1.0000 | ORAL_TABLET | Freq: Four times a day (QID) | ORAL | Status: DC | PRN
  Administered 2021-04-28 – 2021-04-30 (×5): 2 via ORAL
  Administered 2021-05-01 (×2): 1 via ORAL
  Administered 2021-05-02 (×2): 2 via ORAL
  Filled 2021-04-28 (×5): qty 2
  Filled 2021-04-28 (×2): qty 1
  Filled 2021-04-28 (×2): qty 2

## 2021-04-28 MED ORDER — HYDRALAZINE HCL 20 MG/ML IJ SOLN: 5.0000 mg | INTRAMUSCULAR | Status: DC | PRN

## 2021-04-28 MED ORDER — ATORVASTATIN CALCIUM 40 MG PO TABS
40.0000 mg | ORAL_TABLET | Freq: Every day | ORAL | Status: DC
  Administered 2021-04-28 – 2021-05-06 (×9): 40 mg via ORAL
  Filled 2021-04-28 (×9): qty 1

## 2021-04-28 MED ORDER — PANTOPRAZOLE SODIUM 40 MG PO TBEC
40.0000 mg | DELAYED_RELEASE_TABLET | Freq: Every day | ORAL | Status: DC
  Administered 2021-04-28 – 2021-05-07 (×9): 40 mg via ORAL
  Filled 2021-04-28 (×9): qty 1

## 2021-04-28 MED ORDER — PHENOL 1.4 % MT LIQD: 1.0000 | OROMUCOSAL | Status: DC | PRN

## 2021-04-28 MED ORDER — SODIUM CHLORIDE 0.9 % IV SOLN
2.0000 g | Freq: Two times a day (BID) | INTRAVENOUS | Status: DC
  Administered 2021-04-29: 2 g via INTRAVENOUS
  Filled 2021-04-28: qty 2

## 2021-04-28 MED ORDER — POTASSIUM CHLORIDE CRYS ER 20 MEQ PO TBCR
20.0000 meq | EXTENDED_RELEASE_TABLET | Freq: Once | ORAL | Status: AC
  Administered 2021-04-28: 40 meq via ORAL
  Filled 2021-04-28: qty 2

## 2021-04-28 MED ORDER — LABETALOL HCL 5 MG/ML IV SOLN: 10.0000 mg | INTRAVENOUS | Status: DC | PRN

## 2021-04-28 MED ORDER — METOPROLOL SUCCINATE ER 100 MG PO TB24
100.0000 mg | ORAL_TABLET | Freq: Every day | ORAL | Status: DC
  Administered 2021-04-29 – 2021-05-07 (×9): 100 mg via ORAL
  Filled 2021-04-28 (×9): qty 1

## 2021-04-28 MED ORDER — VANCOMYCIN HCL 1250 MG/250ML IV SOLN
1250.0000 mg | INTRAVENOUS | Status: DC
  Administered 2021-04-29: 1250 mg via INTRAVENOUS
  Filled 2021-04-28: qty 250

## 2021-04-28 MED ORDER — ONDANSETRON HCL 4 MG/2ML IJ SOLN: 4.0000 mg | Freq: Four times a day (QID) | INTRAMUSCULAR | Status: DC | PRN

## 2021-04-28 MED ORDER — INSULIN ASPART 100 UNIT/ML IJ SOLN
0.0000 [IU] | Freq: Three times a day (TID) | INTRAMUSCULAR | Status: DC
  Administered 2021-04-29: 11 [IU] via SUBCUTANEOUS
  Administered 2021-04-29: 3 [IU] via SUBCUTANEOUS
  Administered 2021-04-29: 8 [IU] via SUBCUTANEOUS
  Administered 2021-04-30: 5 [IU] via SUBCUTANEOUS
  Administered 2021-04-30: 3 [IU] via SUBCUTANEOUS
  Administered 2021-04-30: 5 [IU] via SUBCUTANEOUS
  Administered 2021-05-01: 15 [IU] via SUBCUTANEOUS
  Administered 2021-05-01 – 2021-05-03 (×6): 5 [IU] via SUBCUTANEOUS
  Administered 2021-05-03: 8 [IU] via SUBCUTANEOUS
  Administered 2021-05-04 (×3): 5 [IU] via SUBCUTANEOUS
  Administered 2021-05-05: 11 [IU] via SUBCUTANEOUS
  Administered 2021-05-05 – 2021-05-06 (×2): 8 [IU] via SUBCUTANEOUS
  Administered 2021-05-06: 15 [IU] via SUBCUTANEOUS
  Administered 2021-05-07: 5 [IU] via SUBCUTANEOUS
  Administered 2021-05-07: 3 [IU] via SUBCUTANEOUS

## 2021-04-28 MED ORDER — METOPROLOL TARTRATE 5 MG/5ML IV SOLN: 2.0000 mg | INTRAVENOUS | Status: DC | PRN

## 2021-04-28 MED ORDER — HEPARIN SODIUM (PORCINE) 5000 UNIT/ML IJ SOLN
5000.0000 [IU] | Freq: Three times a day (TID) | INTRAMUSCULAR | Status: DC
  Administered 2021-04-28 – 2021-05-07 (×25): 5000 [IU] via SUBCUTANEOUS
  Filled 2021-04-28 (×25): qty 1

## 2021-04-28 MED ORDER — INSULIN GLARGINE-YFGN 100 UNIT/ML ~~LOC~~ SOLN
16.0000 [IU] | Freq: Every day | SUBCUTANEOUS | Status: DC
Start: 2021-04-28 — End: 2021-05-05
  Administered 2021-04-28 – 2021-05-04 (×7): 16 [IU] via SUBCUTANEOUS
  Filled 2021-04-28 (×9): qty 0.16

## 2021-04-28 NOTE — Progress Notes (Signed)
Patient name: Michael [name] MRN: [id number] DOB: 05/21/1950 Sex: male  REASON FOR VISIT:     Follow up  HISTORY OF PRESENT ILLNESS:   Michael [name] is a 71 y.o. male with history of right leg claudication with failed percutaneous intervention.  On 04/16/2021 he underwent right femoral to above-knee popliteal artery bypass graft with saphenous vein.  He has developed redness and drainage from his  CURRENT MEDICATIONS:    Current Outpatient Medications  Medication Sig Dispense Refill   ACCU-CHEK AVIVA PLUS test strip AS DIRECTED SEVEN TIMES A DAY IN VITRO 90 DAYS  4   amLODipine (NORVASC) 10 MG tablet Take 10 mg by mouth daily.     atorvastatin (LIPITOR) 40 MG tablet Take 40 mg by mouth at bedtime.  5   benazepril (LOTENSIN) 40 MG tablet Take 40 mg by mouth daily.     clonazePAM (KLONOPIN) 1 MG tablet Take 1 mg by mouth at bedtime as needed (sleep).     clopidogrel (PLAVIX) 75 MG tablet TAKE 1 TABLET BY MOUTH EVERY DAY WITH BREAKFAST 90 tablet 3   Continuous Blood Gluc Receiver (FREESTYLE LIBRE 14 DAY READER) DEVI use to monitor blood sugar     dapagliflozin propanediol (FARXIGA) 5 MG TABS tablet 5 mg daily.     Dulaglutide 1.5 MG/0.5ML SOPN Inject 1.5 mg into the skin once a week.     famotidine (PEPCID) 20 MG tablet Take 20 mg by mouth 2 (two) times daily.     fluticasone (FLONASE) 50 MCG/ACT nasal spray Place 2 sprays into both nostrils at bedtime as needed for allergies (before uses CPAP if needed).     folic acid (FOLVITE) 1 MG tablet Take 1 mg by mouth daily.     HUMALOG KWIKPEN 100 UNIT/ML KwikPen Inject 34 Units into the skin 2 (two) times daily.     hydrochlorothiazide (HYDRODIURIL) 25 MG tablet Take 1 tablet (25 mg total) by mouth daily. Please make overdue appt with Dr. Irish Lack before anymore refills. 2nd attempt 15 tablet 0   HYDROcodone-acetaminophen (NORCO) 5-325 MG tablet Take 1 tablet by mouth every 6 (six) hours as needed for  moderate pain. 30 tablet 0   ibuprofen (ADVIL,MOTRIN) 200 MG tablet Take 400-800 mg by mouth daily as needed for headache.     insulin glargine (LANTUS) 100 UNIT/ML injection Inject 16 Units into the skin at bedtime.     metFORMIN (GLUCOPHAGE-XR) 500 MG 24 hr tablet Take 2,000 mg by mouth at bedtime.     methotrexate 2.5 MG tablet Take 15 mg by mouth every Tuesday.     metoprolol succinate (TOPROL-XL) 100 MG 24 hr tablet Take 100 mg by mouth daily.      naproxen sodium (ALEVE) 220 MG tablet Take 440 mg by mouth daily.     solifenacin (VESICARE) 5 MG tablet Take 5 mg by mouth daily.     tamsulosin (FLOMAX) 0.4 MG CAPS capsule Take 0.4 mg by mouth at bedtime.  5   oxyCODONE-acetaminophen (PERCOCET/ROXICET) 5-325 MG tablet Take 1 tablet by mouth every 6 (six) hours as needed. (Patient not taking: Reported on 04/28/2021) 30 tablet 0   No current facility-administered medications for this visit.    REVIEW OF SYSTEMS:   [X]  denotes positive finding, [ ]  denotes negative finding Cardiac  Comments:  Chest pain or chest pressure: Incisions   Shortness of breath upon exertion:    Short of breath when lying flat:    Irregular heart rhythm:  Constitutional    Fever or chills:      PHYSICAL EXAM:   Vitals:   04/28/21 1041  BP: 116/64  Pulse: 89  Resp: 20  Temp: 97.6 F (36.4 C)  SpO2: 96%  Weight: 222 lb (100.7 kg)  Height: 6' (1.829 m)    GENERAL: The patient is a well-nourished male, in no acute distress. The vital signs are documented above. CARDIOVASCULAR: There is a regular rate and rhythm. PULMONARY: Non-labored respirations Diffuse erythema surrounding his vein harvest incisions and bypass incisions  STUDIES:   None   MEDICAL ISSUES:   Infected right femoral-popliteal bypass graft: This is a vein bypass graft.  I am admitting him to the hospital for IV antibiotics and pain control.  He does have reactions to many pain medicines but says he can take hydrocodone.  We will  also need to try and control the drainage from his incision.  Leia Alf, MD, FACS [facility] [phone] Pager [phone]

## 2021-04-28 NOTE — Progress Notes (Signed)
Pt direct admit, Donzetta Matters @ bedside to assess, VSS, CHG done, tele started, oriented to unit, call light within reach, orders checked.   Chrisandra Carota, RN 04/28/2021 5:43 PM

## 2021-04-28 NOTE — H&P (Signed)
HPI H  Michael [name] is a 71 y.o. male with history of right leg claudication with failed percutaneous intervention.  On 04/16/2021 he underwent right femoral to above-knee popliteal artery bypass graft with saphenous vein. He has developed redness and drainage from his his medial thigh.  He was evaluated in the office today plan for admission with IV antibiotics.   MEDICATIONS         Current Outpatient Medications  Medication Sig Dispense Refill   ACCU-CHEK AVIVA PLUS test strip AS DIRECTED SEVEN TIMES A DAY IN VITRO 90 DAYS   4   amLODipine (NORVASC) 10 MG tablet Take 10 mg by mouth daily.       atorvastatin (LIPITOR) 40 MG tablet Take 40 mg by mouth at bedtime.   5   benazepril (LOTENSIN) 40 MG tablet Take 40 mg by mouth daily.       clonazePAM (KLONOPIN) 1 MG tablet Take 1 mg by mouth at bedtime as needed (sleep).       clopidogrel (PLAVIX) 75 MG tablet TAKE 1 TABLET BY MOUTH EVERY DAY WITH BREAKFAST 90 tablet 3   Continuous Blood Gluc Receiver (FREESTYLE LIBRE 14 DAY READER) DEVI use to monitor blood sugar       dapagliflozin propanediol (FARXIGA) 5 MG TABS tablet 5 mg daily.       Dulaglutide 1.5 MG/0.5ML SOPN Inject 1.5 mg into the skin once a week.       famotidine (PEPCID) 20 MG tablet Take 20 mg by mouth 2 (two) times daily.       fluticasone (FLONASE) 50 MCG/ACT nasal spray Place 2 sprays into both nostrils at bedtime as needed for allergies (before uses CPAP if needed).       folic acid (FOLVITE) 1 MG tablet Take 1 mg by mouth daily.       HUMALOG KWIKPEN 100 UNIT/ML KwikPen Inject 34 Units into the skin 2 (two) times daily.       hydrochlorothiazide (HYDRODIURIL) 25 MG tablet Take 1 tablet (25 mg total) by mouth daily. Please make overdue appt with Dr. Irish Lack before anymore refills. 2nd attempt 15 tablet 0   HYDROcodone-acetaminophen (NORCO) 5-325 MG tablet Take 1 tablet by mouth every 6 (six) hours as needed for moderate pain. 30 tablet 0   ibuprofen  (ADVIL,MOTRIN) 200 MG tablet Take 400-800 mg by mouth daily as needed for headache.       insulin glargine (LANTUS) 100 UNIT/ML injection Inject 16 Units into the skin at bedtime.       metFORMIN (GLUCOPHAGE-XR) 500 MG 24 hr tablet Take 2,000 mg by mouth at bedtime.       methotrexate 2.5 MG tablet Take 15 mg by mouth every Tuesday.       metoprolol succinate (TOPROL-XL) 100 MG 24 hr tablet Take 100 mg by mouth daily.        naproxen sodium (ALEVE) 220 MG tablet Take 440 mg by mouth daily.       solifenacin (VESICARE) 5 MG tablet Take 5 mg by mouth daily.       tamsulosin (FLOMAX) 0.4 MG CAPS capsule Take 0.4 mg by mouth at bedtime.   5   oxyCODONE-acetaminophen (PERCOCET/ROXICET) 5-325 MG tablet Take 1 tablet by mouth every 6 (six) hours as needed. (Patient not taking: Reported on 04/28/2021) 30 tablet 0    No current facility-administered medications for this visit.     ROS [X]  denotes positive finding, [ ]  denotes negative finding Cardiac  Comments:  Chest pain or chest pressure: Incisions    Shortness of breath upon exertion:      Short of breath when lying flat:      Irregular heart rhythm:      Constitutional      Fever or chills:           Vitals:   04/28/21 1735  BP: (!) 123/92  Pulse: 100  Temp: 97.7 F (36.5 C)  SpO2: 100%     Physical Exam GENERAL: The patient is a well-nourished male, in no acute distress. The vital signs are documented above. CARDIOVASCULAR: There is a regular rate and rhythm. PULMONARY: Non-labored respirations Diffuse erythema surrounding his vein harvest incisions and bypass incisions     Assessment and plan:  Infected right femoral-popliteal bypass graft: This is a vein bypass graft.  I am admitting him to the hospital for IV antibiotics and pain control.  He does have reactions to many pain medicines but says he can take hydrocodone.  We will also need to try and control the drainage from his incision.   Michael [name] C. Donzetta Matters,  MD [facility] [place] Office: [phone] Pager: [phone]

## 2021-04-28 NOTE — Progress Notes (Addendum)
Pharmacy Antibiotic Note  Michael [name] is a 71 y.o. male admitted on 04/28/2021 s/p R femoral above-knee popliteal artery bypass graph on 04/16/2021 now with redness and drainage.  Pharmacy has been consulted for vancomycin and cefepime dosing for concern of infected R femoral-popliteal bypass graft. Afebrile. Patient reported PCN allergy - back when 71 years old given PCN in doctor's office and started chocking. Discussed with Dr. Donzetta Matters and will use cefepime instead of Zosyn. Scr 1.49 with CrCl ~55 ml/min with baseline ~0.8-0.9.  Plan: Cefepime 2g x1 followed by cefepime 2g q12h Vancomycin 2000mg  x1 loading dose followed by vancomycin 1250mg  q24h (eAUC 549 using scr 1.49 and Vd 0.5) - will monitor renal function very closely for need to change to random level dosing  Monitor renal function, cultures, and clinical progression Vancomycin levels as appropriate       Temp (24hrs), Avg:97.7 F (36.5 C), Min:97.6 F (36.4 C), Max:97.7 F (36.5 C)  No results for input(s): WBC, CREATININE, LATICACIDVEN, VANCOTROUGH, VANCOPEAK, VANCORANDOM, GENTTROUGH, GENTPEAK, GENTRANDOM, TOBRATROUGH, TOBRAPEAK, TOBRARND, AMIKACINPEAK, AMIKACINTROU, AMIKACIN in the last 168 hours.  Estimated Creatinine Clearance: 95.3 mL/min (by C-G formula based on SCr of 0.86 mg/dL).    Allergies  Allergen Reactions   Demerol Anaphylaxis   Lactose Intolerance (Gi) Anaphylaxis   Oxycodone Anxiety and Other (See Comments)    Suicidal ideation   Penicillins Anaphylaxis and Other (See Comments)   Actos [Pioglitazone] Nausea Only   Exenatide Nausea Only and Other (See Comments)   Glimepiride Other (See Comments)    Edema in ankles    Nabumetone Nausea Only and Other (See Comments)   Victoza [Liraglutide] Nausea Only    Shaking   Fentanyl Anxiety    Jittery    Hydrocodone Anxiety    Suicidal ideation    Antimicrobials this admission: Vancomycin 2/27 >>  Cefepime 2/27 >>  Dose adjustments this  admission:  Microbiology results:   Thank you for allowing pharmacy to be a part of this patients care.  Cristela Felt, PharmD, BCPS Clinical Pharmacist 04/28/2021 5:45 PM

## 2021-04-29 DIAGNOSIS — E119 Type 2 diabetes mellitus without complications: Secondary | ICD-10-CM | POA: Diagnosis not present

## 2021-04-29 DIAGNOSIS — E1165 Type 2 diabetes mellitus with hyperglycemia: Secondary | ICD-10-CM | POA: Diagnosis not present

## 2021-04-29 DIAGNOSIS — Z794 Long term (current) use of insulin: Secondary | ICD-10-CM | POA: Diagnosis not present

## 2021-04-29 LAB — BASIC METABOLIC PANEL
Anion gap: 15 (ref 5–15)
BUN: 35 mg/dL — ABNORMAL HIGH (ref 8–23)
CO2: 22 mmol/L (ref 22–32)
Calcium: 9.1 mg/dL (ref 8.9–10.3)
Chloride: 96 mmol/L — ABNORMAL LOW (ref 98–111)
Creatinine, Ser: 1.2 mg/dL (ref 0.61–1.24)
GFR, Estimated: >60 mL/min (ref >60)
Glucose, Bld: 310 mg/dL — ABNORMAL HIGH (ref 70–99)
Potassium: 3.8 mmol/L (ref 3.5–5.1)
Sodium: 133 mmol/L — ABNORMAL LOW (ref 135–145)

## 2021-04-29 LAB — GLUCOSE, CAPILLARY
Glucose-Capillary: 170 mg/dL — ABNORMAL HIGH (ref 70–99)
Glucose-Capillary: 324 mg/dL — ABNORMAL HIGH (ref 70–99)
Glucose-Capillary: 99 mg/dL (ref 70–99)
Glucose-Capillary: 274 mg/dL — ABNORMAL HIGH (ref 70–99)

## 2021-04-29 MED ORDER — CLONAZEPAM 1 MG PO TABS: 1.0000 mg | ORAL_TABLET | Freq: Every evening | ORAL | Status: DC | PRN

## 2021-04-29 MED ORDER — INSULIN ASPART 100 UNIT/ML IJ SOLN
4.0000 [IU] | Freq: Three times a day (TID) | INTRAMUSCULAR | Status: DC
  Administered 2021-04-29 – 2021-05-03 (×8): 4 [IU] via SUBCUTANEOUS

## 2021-04-29 MED ORDER — SODIUM CHLORIDE 0.9 % IV SOLN
2.0000 g | Freq: Three times a day (TID) | INTRAVENOUS | Status: DC
  Administered 2021-04-29 – 2021-05-07 (×23): 2 g via INTRAVENOUS
  Filled 2021-04-29 (×23): qty 2

## 2021-04-29 NOTE — Progress Notes (Signed)
°  Transition of Care [facility]) Screening Note   Patient Details  Name: Michael [name] Date of Birth: 06-20-1950   Transition of Care [facility]) CM/SW Contact:    Benard Halsted, LCSW Phone Number: 04/29/2021, 9:10 AM    Transition of Care Department [facility]) has reviewed patient and no TOC needs have been identified at this time. We will continue to monitor patient advancement through interdisciplinary progression rounds. If new patient transition needs arise, please place a TOC consult.

## 2021-04-29 NOTE — Progress Notes (Addendum)
Inpatient Diabetes Program Recommendations  AACE/ADA: New Consensus Statement on Inpatient Glycemic Control (2015)  Target Ranges:  Prepandial:   less than 140 mg/dL      Peak postprandial:   less than 180 mg/dL (1-2 hours)      Critically ill patients:  140 - 180 mg/dL   Lab Results  Component Value Date   GLUCAP 170 (H) 04/29/2021   HGBA1C 8.0 (H) 04/16/2021    Review of Glycemic Control  Latest Reference Range & Units 04/28/21 21:47 04/29/21 06:36  Glucose-Capillary 70 - 99 mg/dL 365 (H) 170 (H)   Diabetes history: DM 2 Outpatient Diabetes medications:  Farxiga 5 mg daily Dulaglutide 1.5 mg weekly Humalog 34 units with PM meal Lantus 16 units q HS Metformin XR 2000 mg q PM Current orders for Inpatient glycemic control:  Novolog moderate tid with meals Semglee 16 units q HS  Inpatient Diabetes Program Recommendations:    Please add Novolog meal coverage 4 units tid with meals (hold if patient eats less than 50% or NPO).   Thanks,  Adah Perl, RN, BC-ADM Inpatient Diabetes Coordinator Pager [phone]  (8a-5p)  Addendum [phone]- Spoke with patient regarding home DM management. He states that he see's Dr. Buddy Duty and wears CGM.  He feels that his blood sugars have been variable since last DM medication change. Also discussed the importance of glycemic control to prevent infections. I asked if he was having lows, and he states sometimes.  Encouraged close f/u with Dr. Buddy Duty and explained that 34 units with supper of Humalog may be too much.  Patient verbalized understanding.  He states that blood sugars are higher in hospital then at home. Appears to need meal coverage with each meal while in the hospital.  Will follow.

## 2021-04-29 NOTE — Progress Notes (Signed)
Pharmacy Antibiotic Note  Michael [name] is a 71 y.o. male admitted on 04/28/2021 s/p R femoral above-knee popliteal artery bypass graph on 04/16/2021 now with redness and drainage.  Pharmacy has been consulted for vancomycin and cefepime dosing for concern of infected R femoral-popliteal bypass graft. Afebrile. Patient reported PCN allergy - back when 71 years old given PCN in doctor's office and started chocking. Discussed with Dr. Donzetta Matters and will use cefepime instead of Zosyn. Scr 1.49 with CrCl ~55 ml/min with baseline ~0.8-0.9.  2/28 Cr improved 1.2 > crcl > 32ml/min  Plan: Increase frequency cefepime 2gm IV q8h Vancomycin 2000mg  x1 loading dose followed by vancomycin 1250mg  q24h (eAUC 549 using scr 1.49 and Vd 0.5) Monitor renal function, cultures, and clinical progression  And Vancomycin levels as appropriate       Temp (24hrs), Avg:98 F (36.7 C), Min:97.7 F (36.5 C), Max:98.4 F (36.9 C)  Recent Labs  Lab 04/28/21 1807 04/29/21 0053  WBC 12.0*  --   CREATININE 1.49* 1.20     Estimated Creatinine Clearance: 68.3 mL/min (by C-G formula based on SCr of 1.2 mg/dL).    Allergies  Allergen Reactions   Demerol Anaphylaxis   Lactose Intolerance (Gi) Anaphylaxis   Oxycodone Anxiety and Other (See Comments)    Suicidal ideation   Penicillins Anaphylaxis and Other (See Comments)   Actos [Pioglitazone] Nausea Only   Exenatide Nausea Only and Other (See Comments)   Glimepiride Other (See Comments)    Edema in ankles    Nabumetone Nausea Only and Other (See Comments)   Victoza [Liraglutide] Nausea Only    Shaking   Fentanyl Anxiety    Jittery    Hydrocodone Anxiety    Suicidal ideation    Antimicrobials this admission: Vancomycin 2/27 >>  Cefepime 2/27 >>  Dose adjustments this admission:  Microbiology results:   Bonnita Nasuti Pharm.D. CPP, BCPS Clinical Pharmacist [phone] 04/29/2021 1:38 PM

## 2021-04-29 NOTE — Progress Notes (Addendum)
°  Progress Note    04/29/2021 7:46 AM * No surgery found *  Subjective:  Denies rest pain R foot   Vitals:   04/29/21 0332 04/29/21 0745  BP: (!) 161/87 136/89  Pulse: 100 100  Resp: 19 20  Temp: 98.4 F (36.9 C) 97.7 F (36.5 C)  SpO2: 100% 100%   Physical Exam: Lungs:  non labored Incisions:  dressing recently changed R leg Extremities:  brisk R PT by doppler Neurologic: A&O  CBC    Component Value Date/Time   WBC 12.0 (H) 04/28/2021 1807   RBC 3.56 (L) 04/28/2021 1807   HGB 12.3 (L) 04/28/2021 1807   HCT 35.2 (L) 04/28/2021 1807   PLT 251 04/28/2021 1807   MCV 98.9 04/28/2021 1807   MCH 34.6 (H) 04/28/2021 1807   MCHC 34.9 04/28/2021 1807   RDW 13.4 04/28/2021 1807    BMET    Component Value Date/Time   NA 133 (L) 04/29/2021 0053   NA 145 (H) 05/29/2020 1304   K 3.8 04/29/2021 0053   CL 96 (L) 04/29/2021 0053   CO2 22 04/29/2021 0053   GLUCOSE 310 (H) 04/29/2021 0053   BUN 35 (H) 04/29/2021 0053   BUN 16 05/29/2020 1304   CREATININE 1.20 04/29/2021 0053   CALCIUM 9.1 04/29/2021 0053   GFRNONAA >60 04/29/2021 0053   GFRAA 107 12/02/2017 1100    INR    Component Value Date/Time   INR 0.9 04/14/2021 1046     Intake/Output Summary (Last 24 hours) at 04/29/2021 0746 Last data filed at 04/29/2021 0745 Gross per 24 hour  Intake 1214.45 ml  Output 200 ml  Net 1014.45 ml     Assessment/Plan:  71 y.o. male is s/p R femoral to AK pop with vein * No surgery found *   R foot well perfused with brisk PT signal Continue broad spectrum IV antibiotics Continue dry dressing changes BID and as needed; could consider prevena incisional vac if peri-wound skin improves   Dagoberto Ligas, PA-C [facility] [phone] 04/29/2021 7:46 AM   I agree with the above.  I have seen and evaluate the patient.  His erythema is decreased as has the drainage. Acute renal insufficiency: He has had a slight increase in his creatinine.  This  potentially could be related to his vancomycin.  We will check levels soon and monitor his creatinine.  I discussed with the patient and his wife that he will likely require admission for another 24 to 48 hours.  Annamarie Major

## 2021-04-30 LAB — GLUCOSE, CAPILLARY
Glucose-Capillary: 167 mg/dL — ABNORMAL HIGH (ref 70–99)
Glucose-Capillary: 225 mg/dL — ABNORMAL HIGH (ref 70–99)
Glucose-Capillary: 248 mg/dL — ABNORMAL HIGH (ref 70–99)
Glucose-Capillary: 274 mg/dL — ABNORMAL HIGH (ref 70–99)

## 2021-04-30 LAB — BASIC METABOLIC PANEL
Anion gap: 10 (ref 5–15)
BUN: 22 mg/dL (ref 8–23)
CO2: 25 mmol/L (ref 22–32)
Calcium: 8.6 mg/dL — ABNORMAL LOW (ref 8.9–10.3)
Chloride: 101 mmol/L (ref 98–111)
Creatinine, Ser: 0.87 mg/dL (ref 0.61–1.24)
GFR, Estimated: >60 mL/min (ref >60)
Glucose, Bld: 85 mg/dL (ref 70–99)
Potassium: 3.6 mmol/L (ref 3.5–5.1)
Sodium: 136 mmol/L (ref 135–145)

## 2021-04-30 LAB — URINALYSIS, COMPLETE (UACMP) WITH MICROSCOPIC
Bilirubin Urine: NEGATIVE
Glucose, UA: >=500 mg/dL — AB
Hgb urine dipstick: NEGATIVE
Ketones, ur: 5 mg/dL — AB
Leukocytes,Ua: NEGATIVE
Nitrite: NEGATIVE
Protein, ur: NEGATIVE mg/dL
Specific Gravity, Urine: 1.021 (ref 1.005–1.030)
pH: 5 (ref 5.0–8.0)

## 2021-04-30 LAB — CBC
HCT: 32.5 % — ABNORMAL LOW (ref 39.0–52.0)
Hemoglobin: 10.6 g/dL — ABNORMAL LOW (ref 13.0–17.0)
MCH: 33 pg (ref 26.0–34.0)
MCHC: 32.6 g/dL (ref 30.0–36.0)
MCV: 101.2 fL — ABNORMAL HIGH (ref 80.0–100.0)
Platelets: 254 10*3/uL (ref 150–400)
RBC: 3.21 MIL/uL — ABNORMAL LOW (ref 4.22–5.81)
RDW: 13.3 % (ref 11.5–15.5)
WBC: 8.6 10*3/uL (ref 4.0–10.5)
nRBC: 0 % (ref 0.0–0.2)

## 2021-04-30 MED ORDER — VANCOMYCIN HCL IN DEXTROSE 1-5 GM/200ML-% IV SOLN
1000.0000 mg | Freq: Two times a day (BID) | INTRAVENOUS | Status: DC
  Administered 2021-04-30 – 2021-05-06 (×12): 1000 mg via INTRAVENOUS
  Filled 2021-04-30 (×14): qty 200

## 2021-04-30 NOTE — Progress Notes (Signed)
Inpatient Diabetes Program Recommendations ? ?AACE/ADA: New Consensus Statement on Inpatient Glycemic Control (2015) ? ?Target Ranges:  Prepandial:   less than 140 mg/dL ?     Peak postprandial:   less than 180 mg/dL (1-2 hours) ?     Critically ill patients:  140 - 180 mg/dL  ? ?Lab Results  ?Component Value Date  ? GLUCAP 225 (H) 04/30/2021  ? HGBA1C 8.0 (H) 04/16/2021  ? ? ?Review of Glycemic Control ? Latest Reference Range & Units 04/30/21 06:53 04/30/21 12:17  ?Glucose-Capillary 70 - 99 mg/dL 167 (H) 225 (H)  ?(H): Data is abnormally high ? ?Diabetes history: DM 2 ?Outpatient Diabetes medications:  ?Farxiga 5 mg daily ?Dulaglutide 1.5 mg weekly ?Humalog 34 units with PM meal ?Lantus 16 units q HS ?Metformin XR 2000 mg q PM ?Current orders for Inpatient glycemic control:  ?Novolog moderate tid with meals ?Semglee 16 units q HS ?Novolog 4 units TID with meals ? ?Inpatient Diabetes Program Recommendations:   ? ?Novolog 6 units TID with meals if eats at least 50% ? ?Will continue to follow while inpatient. ? ?Thank you, ?Reche Dixon, MSN, RN ?Diabetes Coordinator ?Inpatient Diabetes Program ?[phone] (team pager from 8a-5p) ? ? ? ?

## 2021-04-30 NOTE — Progress Notes (Addendum)
?  Progress Note ? ? ? ?04/30/2021 ?8:16 AM ?* No surgery found * ? ?Subjective:  Michael [name] about going home today   ? ? ?Vitals:  ? 04/29/21 2312 04/30/21 0326  ?BP: 130/77 (!) 152/89  ?Pulse: 84 97  ?Resp: 15 20  ?Temp: 99.2 ?F (37.3 ?C)   ?SpO2: 99% 97%  ? ?Physical Exam: ?Lungs:  non labored ?Incisions:  R leg incisions similar to picture from 2/27 ?Extremities:  foot warm and well perfused ?Neurologic: A&O ? ?CBC ?   ?Component Value Date/Time  ? WBC 8.6 04/30/2021 0128  ? RBC 3.21 (L) 04/30/2021 0128  ? HGB 10.6 (L) 04/30/2021 0128  ? HCT 32.5 (L) 04/30/2021 0128  ? PLT 254 04/30/2021 0128  ? MCV 101.2 (H) 04/30/2021 0128  ? MCH 33.0 04/30/2021 0128  ? MCHC 32.6 04/30/2021 0128  ? RDW 13.3 04/30/2021 0128  ? ? ?BMET ?   ?Component Value Date/Time  ? NA 136 04/30/2021 0128  ? NA 145 (H) 05/29/2020 1304  ? K 3.6 04/30/2021 0128  ? CL 101 04/30/2021 0128  ? CO2 25 04/30/2021 0128  ? GLUCOSE 85 04/30/2021 0128  ? BUN 22 04/30/2021 0128  ? BUN 16 05/29/2020 1304  ? CREATININE 0.87 04/30/2021 0128  ? CALCIUM 8.6 (L) 04/30/2021 0128  ? GFRNONAA >60 04/30/2021 0128  ? GFRAA 107 12/02/2017 1100  ? ? ?INR ?   ?Component Value Date/Time  ? INR 0.9 04/14/2021 1046  ? ? ? ?Intake/Output Summary (Last 24 hours) at 04/30/2021 0816 ?Last data filed at 04/30/2021 0551 ?Gross per 24 hour  ?Intake 1197.76 ml  ?Output 625 ml  ?Net 572.76 ml  ? ? ? ?Assessment/Plan:  71 y.o. male is s/p R femoral to AK pop with vein  * No surgery found *  ? ?R foot warm and well perfused ?Dressing changed this morning; drainage seems to have improved since admission ?Renal labs improved ?Patient is Michael [name] about going home today; will discuss timing of discharge and antibiotic choice with Dr. Trula Slade ? ? ?Dagoberto Ligas, PA-C ?[facility] ?[phone] ?04/30/2021 ?8:16 AM ? ?I have seen and evaluated the patient.  His erythema continues to improve.  He is still having some drainage from his incisions.  Because of the issues with his skin,  I am not sure that placing a VAC would be beneficial.  I discussed continuing his antibiotics and trying to keep his wound dry.  He will need to remain in the hospital until we can get control over his drainage, and the infection appears to be better controlled.  He continues to have good perfusion of the foot suggesting that the bypass graft remains widely patent. ? ?Annamarie Major ? ?

## 2021-04-30 NOTE — Progress Notes (Signed)
Pharmacy Antibiotic Note ? ?Michael [name] is a 71 y.o. male admitted on 04/28/2021 s/p R femoral above-knee popliteal artery bypass graph on 04/16/2021 now with redness and drainage.  Pharmacy has been consulted for vancomycin and cefepime dosing for concern of infected R femoral-popliteal bypass graft. Afebrile. Patient reported PCN allergy - back when 71 years old given PCN in doctor's office and started chocking. Discussed with Dr. Donzetta Matters and will use cefepime instead of Zosyn. Scr 1.49 with CrCl ~55 ml/min with baseline ~0.8-0.9. ? ?2/28 Cr improved 1.2 > crcl > 62ml/min ?3/1 SCr continues to improve.  Will adjust Vancomycin dosing.  Noted pt ideally would like to be discharged today. ? ?Plan: ?Continue cefepime 2gm IV q8h ?Change Vancomycin 1gm IV q12h  ?(eAUC 534 using scr 0.87 and Vd 0.5) ?Monitor renal function, cultures, and clinical progression ? And Vancomycin levels as appropriate  ? ?  ? ?Temp (24hrs), Avg:99.4 ?F (37.4 ?C), Min:98.1 ?F (36.7 ?C), Max:100.8 ?F (38.2 ?C) ? ?Recent Labs  ?Lab 04/28/21 ?1807 04/29/21 ?7353 04/30/21 ?0128  ?WBC 12.0*  --  8.6  ?CREATININE 1.49* 1.20 0.87  ? ?  ?Estimated Creatinine Clearance: 94.2 mL/min (by C-G formula based on SCr of 0.87 mg/dL).   ? ?Allergies  ?Allergen Reactions  ? Demerol Anaphylaxis  ? Lactose Intolerance (Gi) Anaphylaxis  ? Oxycodone Anxiety and Other (See Comments)  ?  Suicidal ideation  ? Penicillins Anaphylaxis and Other (See Comments)  ? Actos [Pioglitazone] Nausea Only  ? Exenatide Nausea Only and Other (See Comments)  ? Glimepiride Other (See Comments)  ?  Edema in ankles ?  ? Nabumetone Nausea Only and Other (See Comments)  ? Victoza [Liraglutide] Nausea Only  ?  Shaking  ? Fentanyl Anxiety  ?  Jittery   ? Hydrocodone Anxiety  ?  Suicidal ideation  ? ? ?Antimicrobials this admission: ?Vancomycin 2/27 >>  ?Cefepime 2/27 >> ? ?Dose adjustments this admission: ?2/28 Cefepime adjusted for improved renal fxn ?3/1 Vanc adjusted for improved renal  fxn ? ?Microbiology results: ?NA ? ? ? ?[employer], Pharm.D., BCPS ?Clinical Pharmacist ?Clinical phone for 04/30/2021 from 7:30-3:00 is [phone]. ? ?**Pharmacist phone directory can be found on [email or web address] listed under [place]. ? ?04/30/2021 11:00 AM ? ? ?

## 2021-04-30 NOTE — Progress Notes (Signed)
Patient feeling anxious this AM. Notified PA and they stated he could have klonopin PRN.  ?Michael [name]  ?

## 2021-05-01 ENCOUNTER — Inpatient Hospital Stay (HOSPITAL_COMMUNITY): Payer: Medicare Other

## 2021-05-01 LAB — BASIC METABOLIC PANEL
Anion gap: 12 (ref 5–15)
BUN: 17 mg/dL (ref 8–23)
CO2: 22 mmol/L (ref 22–32)
Calcium: 8.6 mg/dL — ABNORMAL LOW (ref 8.9–10.3)
Chloride: 101 mmol/L (ref 98–111)
Creatinine, Ser: 0.82 mg/dL (ref 0.61–1.24)
GFR, Estimated: >60 mL/min (ref >60)
Glucose, Bld: 261 mg/dL — ABNORMAL HIGH (ref 70–99)
Potassium: 3.9 mmol/L (ref 3.5–5.1)
Sodium: 135 mmol/L (ref 135–145)

## 2021-05-01 LAB — CBC
HCT: 33.4 % — ABNORMAL LOW (ref 39.0–52.0)
Hemoglobin: 11.2 g/dL — ABNORMAL LOW (ref 13.0–17.0)
MCH: 33.8 pg (ref 26.0–34.0)
MCHC: 33.5 g/dL (ref 30.0–36.0)
MCV: 100.9 fL — ABNORMAL HIGH (ref 80.0–100.0)
Platelets: 285 10*3/uL (ref 150–400)
RBC: 3.31 MIL/uL — ABNORMAL LOW (ref 4.22–5.81)
RDW: 13.2 % (ref 11.5–15.5)
WBC: 6.5 10*3/uL (ref 4.0–10.5)
nRBC: 0 % (ref 0.0–0.2)

## 2021-05-01 LAB — GLUCOSE, CAPILLARY
Glucose-Capillary: 227 mg/dL — ABNORMAL HIGH (ref 70–99)
Glucose-Capillary: 244 mg/dL — ABNORMAL HIGH (ref 70–99)
Glucose-Capillary: 368 mg/dL — ABNORMAL HIGH (ref 70–99)
Glucose-Capillary: 88 mg/dL (ref 70–99)

## 2021-05-01 NOTE — Progress Notes (Signed)
Mobility Specialist Progress Note ? ? 05/01/21 1437  ?Mobility  ?Activity Ambulated with assistance to bathroom  ?Level of Assistance Minimal assist, patient does 75% or more  ?Assistive Device Front wheel walker  ?Distance Ambulated (ft) 22 ft  ?Activity Response Tolerated well  ?$Mobility charge 1 Mobility  ? ?Received pt in BR requesting to get back in bed, pt voided on floor and on self. RN and MS assisted in cleaning up pt. Pt then wanted to attempt a BM and was tx'd back to the BR. On the way there pt had x1 LOB and was corrected w/ min A. Successful BM and returned BTB w/ out fault or complaint.   ? ?Holland Falling ?Mobility Specialist ?Phone Number [phone] ? ?

## 2021-05-01 NOTE — Progress Notes (Addendum)
?  Progress Note ? ? ? ?05/01/2021 ?7:40 AM ?* No surgery found * ? ?Subjective:  No new complaints.  Delirium seems to have resolved ? ? ?Vitals:  ? 05/01/21 0051 05/01/21 0351  ?BP: 140/70 134/71  ?Pulse: 81 78  ?Resp: 18 18  ?Temp: 98.3 ?F (36.8 ?C) 97.8 ?F (36.6 ?C)  ?SpO2: 100% 99%  ? ?Physical Exam: ?Lungs:  non labored ?Incisions:  R leg incisions still with thick drainage with surrounding erythema ?Abdomen:  soft ?Neurologic: A&O ? ?CBC ?   ?Component Value Date/Time  ? WBC 6.5 05/01/2021 0145  ? RBC 3.31 (L) 05/01/2021 0145  ? HGB 11.2 (L) 05/01/2021 0145  ? HCT 33.4 (L) 05/01/2021 0145  ? PLT 285 05/01/2021 0145  ? MCV 100.9 (H) 05/01/2021 0145  ? MCH 33.8 05/01/2021 0145  ? MCHC 33.5 05/01/2021 0145  ? RDW 13.2 05/01/2021 0145  ? ? ?BMET ?   ?Component Value Date/Time  ? NA 135 05/01/2021 0145  ? NA 145 (H) 05/29/2020 1304  ? K 3.9 05/01/2021 0145  ? CL 101 05/01/2021 0145  ? CO2 22 05/01/2021 0145  ? GLUCOSE 261 (H) 05/01/2021 0145  ? BUN 17 05/01/2021 0145  ? BUN 16 05/29/2020 1304  ? CREATININE 0.82 05/01/2021 0145  ? CALCIUM 8.6 (L) 05/01/2021 0145  ? GFRNONAA >60 05/01/2021 0145  ? GFRAA 107 12/02/2017 1100  ? ? ?INR ?   ?Component Value Date/Time  ? INR 0.9 04/14/2021 1046  ? ? ? ?Intake/Output Summary (Last 24 hours) at 05/01/2021 0740 ?Last data filed at 05/01/2021 7510 ?Gross per 24 hour  ?Intake 880 ml  ?Output 1300 ml  ?Net -420 ml  ? ? ? ?Assessment/Plan:  71 y.o. male is s/p R femoral to AK pop with vein   ? ?Patient now more willing to remain hospitalized until wounds improve ?R foot well perfused ?Family was concerned for UTI however UA was negative ?Continue BID and prn dry dressing changes ? ? ?Dagoberto Ligas, PA-C ?[facility] ?[phone] ?05/01/2021 ?7:40 AM ? ?Awaiting ct of leg to assess for fluid collections.  I am concerned about management of his leg drainage.  Due to skin issues, I don't think a vac is the best option.  Cellulitis continues to improve.  Continue IV  abx ? ?Wells Atziri Zubiate ? ? ?Addendum: ? ?I reviewed the CT scan with the patient.  There is subcutaneous air.  I think the best course of action is to goto the OR for wash out and debridement with vac placement.  He is in agreement ? ?Wells Jennylee Uehara ?

## 2021-05-01 NOTE — H&P (View-Only) (Signed)
?  Progress Note ? ? ? ?05/01/2021 ?7:40 AM ?* No surgery found * ? ?Subjective:  No new complaints.  Delirium seems to have resolved ? ? ?Vitals:  ? 05/01/21 0051 05/01/21 0351  ?BP: 140/70 134/71  ?Pulse: 81 78  ?Resp: 18 18  ?Temp: 98.3 ?F (36.8 ?C) 97.8 ?F (36.6 ?C)  ?SpO2: 100% 99%  ? ?Physical Exam: ?Lungs:  non labored ?Incisions:  R leg incisions still with thick drainage with surrounding erythema ?Abdomen:  soft ?Neurologic: A&O ? ?CBC ?   ?Component Value Date/Time  ? WBC 6.5 05/01/2021 0145  ? RBC 3.31 (L) 05/01/2021 0145  ? HGB 11.2 (L) 05/01/2021 0145  ? HCT 33.4 (L) 05/01/2021 0145  ? PLT 285 05/01/2021 0145  ? MCV 100.9 (H) 05/01/2021 0145  ? MCH 33.8 05/01/2021 0145  ? MCHC 33.5 05/01/2021 0145  ? RDW 13.2 05/01/2021 0145  ? ? ?BMET ?   ?Component Value Date/Time  ? NA 135 05/01/2021 0145  ? NA 145 (H) 05/29/2020 1304  ? K 3.9 05/01/2021 0145  ? CL 101 05/01/2021 0145  ? CO2 22 05/01/2021 0145  ? GLUCOSE 261 (H) 05/01/2021 0145  ? BUN 17 05/01/2021 0145  ? BUN 16 05/29/2020 1304  ? CREATININE 0.82 05/01/2021 0145  ? CALCIUM 8.6 (L) 05/01/2021 0145  ? GFRNONAA >60 05/01/2021 0145  ? GFRAA 107 12/02/2017 1100  ? ? ?INR ?   ?Component Value Date/Time  ? INR 0.9 04/14/2021 1046  ? ? ? ?Intake/Output Summary (Last 24 hours) at 05/01/2021 0740 ?Last data filed at 05/01/2021 9563 ?Gross per 24 hour  ?Intake 880 ml  ?Output 1300 ml  ?Net -420 ml  ? ? ? ?Assessment/Plan:  71 y.o. male is s/p R femoral to AK pop with vein   ? ?Patient now more willing to remain hospitalized until wounds improve ?R foot well perfused ?Family was concerned for UTI however UA was negative ?Continue BID and prn dry dressing changes ? ? ?Dagoberto Ligas, PA-C ?[facility] ?[phone] ?05/01/2021 ?7:40 AM ? ?Awaiting ct of leg to assess for fluid collections.  I am concerned about management of his leg drainage.  Due to skin issues, I don't think a vac is the best option.  Cellulitis continues to improve.  Continue IV  abx ? ?Wells Sumeet Geter ? ? ?Addendum: ? ?I reviewed the CT scan with the patient.  There is subcutaneous air.  I think the best course of action is to goto the OR for wash out and debridement with vac placement.  He is in agreement ? ?Wells Diamantina Edinger ?

## 2021-05-01 NOTE — Progress Notes (Signed)
Latest Reference Range & Units 04/30/21 12:17 04/30/21 15:26 04/30/21 21:23 05/01/21 06:19 05/01/21 11:42  ?Glucose-Capillary 70 - 99 mg/dL 225 (H) 248 (H) 274 (H) 227 (H) 244 (H)  ?(H): Data is abnormally high ? ?Noted that patient's postprandial blood sugars have been greater than 200 mg/dl. ? ?Recommend increasing Novolog to 6 units TID with meals if blood sugars continue to be elevated. Titrate as needed. ? ?Harvel Ricks RN [name] CDE ?Diabetes Coordinator ?Pager: [phone]  8am-5pm  ?

## 2021-05-02 ENCOUNTER — Encounter (HOSPITAL_COMMUNITY): Payer: Self-pay | Admitting: Surgery

## 2021-05-02 ENCOUNTER — Inpatient Hospital Stay (HOSPITAL_COMMUNITY): Payer: Medicare Other | Admitting: Certified Registered"

## 2021-05-02 ENCOUNTER — Encounter (HOSPITAL_COMMUNITY)
Admission: RE | Disposition: A | Discharge status: Home Health | Payer: Self-pay | Source: Ambulatory Visit | Attending: Surgery

## 2021-05-02 ENCOUNTER — Other Ambulatory Visit: Payer: Self-pay

## 2021-05-02 DIAGNOSIS — Z794 Long term (current) use of insulin: Secondary | ICD-10-CM

## 2021-05-02 DIAGNOSIS — E1151 Type 2 diabetes mellitus with diabetic peripheral angiopathy without gangrene: Secondary | ICD-10-CM

## 2021-05-02 DIAGNOSIS — I10 Essential (primary) hypertension: Secondary | ICD-10-CM

## 2021-05-02 DIAGNOSIS — T8142XA Infection following a procedure, deep incisional surgical site, initial encounter: Secondary | ICD-10-CM

## 2021-05-02 DIAGNOSIS — I9789 Other postprocedural complications and disorders of the circulatory system, not elsewhere classified: Secondary | ICD-10-CM

## 2021-05-02 HISTORY — PX: I & D EXTREMITY: SHX5045

## 2021-05-02 HISTORY — PX: APPLICATION OF WOUND VAC: SHX5189

## 2021-05-02 LAB — CBC
HCT: 34.7 % — ABNORMAL LOW (ref 39.0–52.0)
Hemoglobin: 11.3 g/dL — ABNORMAL LOW (ref 13.0–17.0)
MCH: 33 pg (ref 26.0–34.0)
MCHC: 32.6 g/dL (ref 30.0–36.0)
MCV: 101.5 fL — ABNORMAL HIGH (ref 80.0–100.0)
Platelets: 335 10*3/uL (ref 150–400)
RBC: 3.42 MIL/uL — ABNORMAL LOW (ref 4.22–5.81)
RDW: 13.1 % (ref 11.5–15.5)
WBC: 6.9 10*3/uL (ref 4.0–10.5)
nRBC: 0 % (ref 0.0–0.2)

## 2021-05-02 LAB — BASIC METABOLIC PANEL
Anion gap: 9 (ref 5–15)
BUN: 15 mg/dL (ref 8–23)
CO2: 26 mmol/L (ref 22–32)
Calcium: 8.6 mg/dL — ABNORMAL LOW (ref 8.9–10.3)
Chloride: 100 mmol/L (ref 98–111)
Creatinine, Ser: 0.98 mg/dL (ref 0.61–1.24)
GFR, Estimated: >60 mL/min (ref >60)
Glucose, Bld: 186 mg/dL — ABNORMAL HIGH (ref 70–99)
Potassium: 3.3 mmol/L — ABNORMAL LOW (ref 3.5–5.1)
Sodium: 135 mmol/L (ref 135–145)

## 2021-05-02 LAB — GLUCOSE, CAPILLARY
Glucose-Capillary: 229 mg/dL — ABNORMAL HIGH (ref 70–99)
Glucose-Capillary: 227 mg/dL — ABNORMAL HIGH (ref 70–99)
Glucose-Capillary: 210 mg/dL — ABNORMAL HIGH (ref 70–99)
Glucose-Capillary: 180 mg/dL — ABNORMAL HIGH (ref 70–99)
Glucose-Capillary: 249 mg/dL — ABNORMAL HIGH (ref 70–99)
Glucose-Capillary: 342 mg/dL — ABNORMAL HIGH (ref 70–99)

## 2021-05-02 LAB — VANCOMYCIN, TROUGH: Vancomycin Tr: 15 ug/mL (ref 15–20)

## 2021-05-02 SURGERY — IRRIGATION AND DEBRIDEMENT EXTREMITY
Anesthesia: General | Site: Leg Upper | Laterality: Right

## 2021-05-02 MED ORDER — HYDROCODONE-ACETAMINOPHEN 5-325 MG PO TABS
1.0000 | ORAL_TABLET | ORAL | Status: DC | PRN
  Administered 2021-05-02 (×2): 1 via ORAL
  Administered 2021-05-02 – 2021-05-07 (×12): 2 via ORAL
  Filled 2021-05-02 (×7): qty 2
  Filled 2021-05-02: qty 1
  Filled 2021-05-02 (×3): qty 2
  Filled 2021-05-02: qty 1
  Filled 2021-05-02 (×2): qty 2

## 2021-05-02 MED ORDER — MIDAZOLAM HCL 2 MG/2ML IJ SOLN
INTRAMUSCULAR | Status: AC
  Administered 2021-05-02: 1 mg via INTRAVENOUS
  Filled 2021-05-02: qty 2

## 2021-05-02 MED ORDER — 0.9 % SODIUM CHLORIDE (POUR BTL) OPTIME
TOPICAL | Status: DC | PRN
  Administered 2021-05-02: 1000 mL

## 2021-05-02 MED ORDER — LACTATED RINGERS IV SOLN: INTRAVENOUS | Status: DC

## 2021-05-02 MED ORDER — FENTANYL CITRATE (PF) 100 MCG/2ML IJ SOLN
INTRAMUSCULAR | Status: AC
  Filled 2021-05-02: qty 2

## 2021-05-02 MED ORDER — PROPOFOL 10 MG/ML IV BOLUS
INTRAVENOUS | Status: DC | PRN
  Administered 2021-05-02: 120 mg via INTRAVENOUS

## 2021-05-02 MED ORDER — FENTANYL CITRATE (PF) 250 MCG/5ML IJ SOLN
INTRAMUSCULAR | Status: AC
  Filled 2021-05-02: qty 5

## 2021-05-02 MED ORDER — PROPOFOL 10 MG/ML IV BOLUS
INTRAVENOUS | Status: AC
  Filled 2021-05-02: qty 20

## 2021-05-02 MED ORDER — DEXAMETHASONE SODIUM PHOSPHATE 10 MG/ML IJ SOLN
INTRAMUSCULAR | Status: DC | PRN
  Administered 2021-05-02: 5 mg via INTRAVENOUS

## 2021-05-02 MED ORDER — ONDANSETRON HCL 4 MG/2ML IJ SOLN
INTRAMUSCULAR | Status: DC | PRN
  Administered 2021-05-02: 4 mg via INTRAVENOUS

## 2021-05-02 MED ORDER — FENTANYL CITRATE (PF) 250 MCG/5ML IJ SOLN
INTRAMUSCULAR | Status: DC | PRN
  Administered 2021-05-02 (×4): 50 ug via INTRAVENOUS

## 2021-05-02 MED ORDER — LIDOCAINE 2% (20 MG/ML) 5 ML SYRINGE
INTRAMUSCULAR | Status: DC | PRN
Start: 2021-05-02 — End: 2021-05-02
  Administered 2021-05-02: 60 mg via INTRAVENOUS

## 2021-05-02 MED ORDER — CHLORHEXIDINE GLUCONATE 0.12 % MT SOLN
15.0000 mL | Freq: Once | OROMUCOSAL | Status: AC
  Administered 2021-05-02: 15 mL via OROMUCOSAL

## 2021-05-02 MED ORDER — ORAL CARE MOUTH RINSE: 15.0000 mL | Freq: Once | OROMUCOSAL | Status: AC

## 2021-05-02 MED ORDER — MIDAZOLAM HCL 2 MG/2ML IJ SOLN: 1.0000 mg | Freq: Once | INTRAMUSCULAR | Status: AC

## 2021-05-02 MED ORDER — MORPHINE SULFATE (PF) 2 MG/ML IV SOLN
2.0000 mg | INTRAVENOUS | Status: DC | PRN
  Administered 2021-05-03 – 2021-05-07 (×5): 2 mg via INTRAVENOUS
  Filled 2021-05-02 (×5): qty 1

## 2021-05-02 MED ORDER — ONDANSETRON HCL 4 MG/2ML IJ SOLN: 4.0000 mg | Freq: Once | INTRAMUSCULAR | Status: DC | PRN

## 2021-05-02 MED ORDER — INSULIN ASPART 100 UNIT/ML IJ SOLN
0.0000 [IU] | INTRAMUSCULAR | Status: DC | PRN
  Administered 2021-05-02: 4 [IU] via SUBCUTANEOUS
  Filled 2021-05-02: qty 1

## 2021-05-02 MED ORDER — FENTANYL CITRATE (PF) 100 MCG/2ML IJ SOLN
25.0000 ug | INTRAMUSCULAR | Status: DC | PRN
  Administered 2021-05-02: 50 ug via INTRAVENOUS

## 2021-05-02 SURGICAL SUPPLY — 60 items
BAG COUNTER SPONGE SURGICOUNT (BAG) ×2 IMPLANT
BAG ISL DRAPE 18X18 STRL (DRAPES) ×1
BAG ISOLATION DRAPE 18X18 (DRAPES) IMPLANT
BAG SPNG CNTER NS LX DISP (BAG) ×1
BAG SURGICOUNT SPONGE COUNTING (BAG) ×1
BNDG ELASTIC 4X5.8 VLCR STR LF (GAUZE/BANDAGES/DRESSINGS) IMPLANT
BNDG ELASTIC 6X5.8 VLCR STR LF (GAUZE/BANDAGES/DRESSINGS) IMPLANT
BNDG GAUZE ELAST 4 BULKY (GAUZE/BANDAGES/DRESSINGS) IMPLANT
CANISTER SUCT 3000ML PPV (MISCELLANEOUS) ×3 IMPLANT
CANISTER WOUNDNEG PRESSURE 500 (CANNISTER) ×2 IMPLANT
CLIP TI MEDIUM 6 (CLIP) ×2 IMPLANT
CLIP TI WIDE RED SMALL 6 (CLIP) ×2 IMPLANT
CLIP VESOCCLUDE MED 6/CT (CLIP) ×3 IMPLANT
CLIP VESOCCLUDE SM WIDE 6/CT (CLIP) ×3 IMPLANT
CONNECTOR Y ATS VAC SYSTEM (MISCELLANEOUS) ×2 IMPLANT
COVER SURGICAL LIGHT HANDLE (MISCELLANEOUS) ×3 IMPLANT
DRAPE HALF SHEET 40X57 (DRAPES) IMPLANT
DRAPE INCISE IOBAN 66X45 STRL (DRAPES) ×2 IMPLANT
DRAPE ISOLATION BAG 18X18 (DRAPES) ×3
DRAPE U-SHAPE 76X120 STRL (DRAPES) IMPLANT
DRSG VAC ATS LRG SENSATRAC (GAUZE/BANDAGES/DRESSINGS) ×2 IMPLANT
ELECT REM PT RETURN 9FT ADLT (ELECTROSURGICAL) ×3
ELECTRODE REM PT RTRN 9FT ADLT (ELECTROSURGICAL) ×1 IMPLANT
GAUZE SPONGE 4X4 12PLY STRL (GAUZE/BANDAGES/DRESSINGS) ×3 IMPLANT
GAUZE SPONGE 4X4 12PLY STRL LF (GAUZE/BANDAGES/DRESSINGS) ×2 IMPLANT
GAUZE XEROFORM 5X9 LF (GAUZE/BANDAGES/DRESSINGS) IMPLANT
GLOVE SRG 8 PF TXTR STRL LF DI (GLOVE) ×1 IMPLANT
GLOVE SURG MICRO LTX SZ6 (GLOVE) ×2 IMPLANT
GLOVE SURG POLYISO LF SZ7.5 (GLOVE) ×3 IMPLANT
GLOVE SURG UNDER POLY LF SZ8 (GLOVE) ×6
GOWN STRL REUS W/ TWL LRG LVL3 (GOWN DISPOSABLE) ×2 IMPLANT
GOWN STRL REUS W/ TWL XL LVL3 (GOWN DISPOSABLE) ×1 IMPLANT
GOWN STRL REUS W/TWL LRG LVL3 (GOWN DISPOSABLE) ×6
GOWN STRL REUS W/TWL XL LVL3 (GOWN DISPOSABLE) ×3
HANDPIECE INTERPULSE COAX TIP (DISPOSABLE)
IV NS IRRIG 3000ML ARTHROMATIC (IV SOLUTION) ×3 IMPLANT
KIT BASIN OR (CUSTOM PROCEDURE TRAY) ×3 IMPLANT
KIT TURNOVER KIT B (KITS) ×3 IMPLANT
NS IRRIG 1000ML POUR BTL (IV SOLUTION) ×3 IMPLANT
PACK CV ACCESS (CUSTOM PROCEDURE TRAY) IMPLANT
PACK GENERAL/GYN (CUSTOM PROCEDURE TRAY) ×3 IMPLANT
PACK UNIVERSAL I (CUSTOM PROCEDURE TRAY) ×3 IMPLANT
PAD ARMBOARD 7.5X6 YLW CONV (MISCELLANEOUS) ×6 IMPLANT
PAD NEG PRESSURE SENSATRAC (MISCELLANEOUS) ×2 IMPLANT
PULSAVAC PLUS IRRIG FAN TIP (DISPOSABLE)
SET HNDPC FAN SPRY TIP SCT (DISPOSABLE) IMPLANT
SPONGE T-LAP 18X18 ~~LOC~~+RFID (SPONGE) ×6 IMPLANT
SUT ETHILON 3 0 PS 1 (SUTURE) IMPLANT
SUT SILK 3 0 (SUTURE) ×3
SUT SILK 3-0 18XBRD TIE 12 (SUTURE) IMPLANT
SUT VIC AB 2-0 CT1 27 (SUTURE) ×6
SUT VIC AB 2-0 CT1 TAPERPNT 27 (SUTURE) IMPLANT
SUT VIC AB 2-0 CTX 36 (SUTURE) IMPLANT
SUT VIC AB 3-0 SH 27 (SUTURE)
SUT VIC AB 3-0 SH 27X BRD (SUTURE) IMPLANT
SUT VICRYL 4-0 PS2 18IN ABS (SUTURE) IMPLANT
TIP FAN IRRIG PULSAVAC PLUS (DISPOSABLE) IMPLANT
TOWEL GREEN STERILE (TOWEL DISPOSABLE) ×3 IMPLANT
TOWEL GREEN STERILE FF (TOWEL DISPOSABLE) ×2 IMPLANT
WATER STERILE IRR 1000ML POUR (IV SOLUTION) ×3 IMPLANT

## 2021-05-02 NOTE — Anesthesia Preprocedure Evaluation (Addendum)
Anesthesia Evaluation  ?Patient identified by MRN, date of birth, ID band ?Patient awake ? ? ? ?Reviewed: ?Allergy & Precautions, NPO status , Patient's Chart, lab work & pertinent test results ? ?History of Anesthesia Complications ?Negative for: history of anesthetic complications ? ?Airway ?Mallampati: III ? ?TM Distance: >3 FB ?Neck ROM: Full ? ? ? Dental ? ?(+) Dental Advisory Given, Missing,  ?  ?Pulmonary ?sleep apnea ,  ?  ?Pulmonary exam normal ? ? ? ? ? ? ? Cardiovascular ?hypertension, Pt. on medications and Pt. on home beta blockers ?+ Peripheral Vascular Disease  ?Normal cardiovascular exam ? ? ?'22 Coronary CT - 1. Coronary calcium score of 318. This was 63rd percentile for age and sex matched controls. ?2. Normal coronary origin with right dominance. ?3. Mild CAD (25-49%) in the mid LAD and mid LCX. ?4. Minimal CAD (<25%) in the RCA). ? ?  ?Neuro/Psych ?PSYCHIATRIC DISORDERS Anxiety negative neurological ROS ?   ? GI/Hepatic ?Neg liver ROS, GERD  Controlled and Medicated,  ?Endo/Other  ?diabetes, Type 2, Insulin Dependent, Oral Hypoglycemic Agents ?Obesity ? ? Renal/GU ?negative Renal ROS  ? ? ?Prostate cancer ? ? ?  ?Musculoskeletal ?negative musculoskeletal ROS ?(+)  ? Abdominal ?  ?Peds ? Hematology ? ?(+) Blood dyscrasia, anemia ,  ?On plavix ?   ?Anesthesia Other Findings ? ? Reproductive/Obstetrics ? ?  ? ? ? ? ? ? ? ? ? ? ? ? ? ?  ?  ? ? ? ? ? ? ? ?Anesthesia Physical ?Anesthesia Plan ? ?ASA: 3 ? ?Anesthesia Plan: General  ? ?Post-op Pain Management:   ? ?Induction: Intravenous ? ?PONV Risk Score and Plan: 2 and Treatment may vary due to age or medical condition, Ondansetron and Dexamethasone ? ?Airway Management Planned: LMA ? ?Additional Equipment: None ? ?Intra-op Plan:  ? ?Post-operative Plan: Extubation in OR ? ?Informed Consent: I have reviewed the patients History and Physical, chart, labs and discussed the procedure including the risks, benefits and  alternatives for the proposed anesthesia with the patient or authorized representative who has indicated his/her understanding and acceptance.  ? ? ? ?Dental advisory given ? ?Plan Discussed with: CRNA and Anesthesiologist ? ?Anesthesia Plan Comments:   ? ? ? ? ? ?Anesthesia Quick Evaluation ? ?

## 2021-05-02 NOTE — Progress Notes (Signed)
VASCULAR SURGERY: ? ?The patient has had some issues with the lower 2 vacs on his right leg.  The right groin VAC has a good seal.  Despite multiple attempts to reseal this he has developed some pooling of blood under the VAC.  For this reason I removed the lower 2 vacs and placed a wet-to-dry dressing.  We will schedule him for a VAC change in the operating room on Monday. ? ?Gae Gallop, MD ?4:32 PM ? ?

## 2021-05-02 NOTE — Progress Notes (Signed)
Patient is alert and oriented tonight. He is aware that last night he was hallucinating. He is continent and ambulated with min assist to bathroom. Pain medication given x1. Fuller Canada, RN ? ?

## 2021-05-02 NOTE — Op Note (Signed)
? ? ?NAME: Michael [name]    MRN: [id number] ?DOB: 12-12-1950    DATE OF OPERATION: 05/02/2021 ? ?PREOP DIAGNOSIS:   ? ?Infected incisions right leg ? ?POSTOP DIAGNOSIS:   ? ?Same with necrotic fat and necrotic skin ? ?PROCEDURE:  ?  ?Excision and debridement of 4 incisions in the right leg and placement of VAC dressings. ? ?SURGEON: Judeth Cornfield. Scot Dock, MD ? ?ASSIST: Arlee Muslim, PA ? ?ANESTHESIA: General ? ?EBL: Minimal ? ?INDICATIONS:  ? ? Michael [name] is a 71 y.o. male who had undergone a right femoral to above-knee popliteal artery bypass with a vein graft approximately 2 weeks ago.  He presented to the office with infected wounds and was admitted for intravenous antibiotics.  He presents to the operating room for debridement of the wounds. ? ?FINDINGS:  ? ?The incision over the proximal anastomosis in the distal anastomosis had necrotic fat and necrotic skin especially at the distal anastomosis.  The vein harvest incision likewise had necrotic fat.  These were all debrided.  The vein graft was covered and was patent.  VAC dressings were applied on all 3 wounds. ? ?TECHNIQUE:  ? ?The patient was taken to the operating room and received a general anesthetic.  The right leg was prepped and draped in usual sterile fashion. ? ?Initially opened the right groin incision.  The dissection was carried down to the bypass graft which was patent.  There was significant fluid around the graft and I sent a intraoperative culture.  The edges of the skin had necrosis circumferentially.  Using a 10 blade I excised an ellipse of healthy skin circumferentially back to healthy tissue.  I then debrided additional necrotic adipose tissue and fascia with pickups and Metzenbaum scissors.  The wound was irrigated with copious amounts of saline. ? ?Attention was then turned to the vein harvest incision in the mid thigh.  Again this incision was open.  The edges of the skin had necrosis and again using a 10 blade I excised an  ellipse of healthy skin circumferentially around this incision.  Necrotic fat was debrided with Metzenbaum scissors. ? ?The incision over the distal anastomosis was opened and the bypass graft here explored and was patent.  I connected this incision to the small distal incision where additional vein had been harvested.  There was significant necrotic skin especially on the posterior flap and I excised this.  This was done with a 10 blade.  I debrided additional necrotic adipose tissue and fascia using Metzenbaum scissors.  Hemostasis was obtained in all the incisions. ? ?Next in the groin incision I was able to close the deep layer with running 2-0 Vicryl.  This covered the vein graft.  A VAC dressing was placed on the right groin. ? ?Distally I was able to close a layer over the top of the vein to protect this.  A VAC dressing was placed here and is another VAC dressing placed over the vein harvest incision.  These 2 were connected with a bridge.  There was a good seal on all 3 vacs. ? ?At the completion of the procedure there was a brisk posterior tibial and anterior tibial signal with the Doppler.  Patient tolerated procedure well was transferred recovery room in stable condition.  All needle and sponge counts were correct. ? ?Given the complexity of the case a first assistant was necessary in order to expedient the procedure and safely perform the technical aspects of the operation. ? ?Deitra Mayo, MD,  FACS ?[facility] [place] [place] ? ?DATE OF DICTATION:   05/02/2021 ? ?

## 2021-05-02 NOTE — Care Management Important Message (Signed)
Important Message ? ?Patient Details  ?Name: Michael [name] ?MRN: [id number] ?Date of Birth: [birth year] ? ? ?Medicare Important Message Given:  Yes ? ? ? ? ?Shelda Altes ?05/02/2021, 10:42 AM ?

## 2021-05-02 NOTE — Transfer of Care (Signed)
Immediate Anesthesia Transfer of Care Note  Patient: Michael [name]  Procedure(s) Performed: IRRIGATION AND DEBRIDEMENT RIGHT LEG (Right: Leg Upper) APPLICATION OF WOUND VAC (Right: Leg Upper)  Patient Location: PACU  Anesthesia Type:General  Level of Consciousness: awake, alert  and oriented  Airway & Oxygen Therapy: Patient Spontanous Breathing  Post-op Assessment: Report given to RN and Post -op Vital signs reviewed and stable  Post vital signs: Reviewed and stable  Last Vitals:  Vitals Value Taken Time  BP 137/74 05/02/21 1405  Temp    Pulse 81 05/02/21 1406  Resp 10 05/02/21 1406  SpO2 95 % 05/02/21 1406  Vitals shown include unvalidated device data.  Last Pain:  Vitals:   05/02/21 1020  TempSrc: Oral  PainSc:          Complications: No notable events documented.

## 2021-05-02 NOTE — Anesthesia Procedure Notes (Signed)
Procedure Name: LMA Insertion ?Date/Time: 05/02/2021 12:30 PM ?Performed by: Georgia Duff, CRNA ?Pre-anesthesia Checklist: Patient identified, Emergency Drugs available, Suction available and Patient being monitored ?Patient Re-evaluated:Patient Re-evaluated prior to induction ?Oxygen Delivery Method: Circle System Utilized ?Preoxygenation: Pre-oxygenation with 100% oxygen ?Induction Type: IV induction ?Ventilation: Mask ventilation without difficulty ?LMA: LMA inserted ?LMA Size: 4.0 ?Number of attempts: 1 ?Airway Equipment and Method: Bite block ?Placement Confirmation: positive ETCO2 ?Tube secured with: Tape ?Dental Injury: Teeth and Oropharynx as per pre-operative assessment  ?Comments: SRNA insert  ? ? ? ? ?

## 2021-05-02 NOTE — Interval H&P Note (Signed)
History and Physical Interval Note: ? ?05/02/2021 ?10:34 AM ? ?Michael [name]  has presented today for surgery, with the diagnosis of Right leg wound.  The various methods of treatment have been discussed with the patient and family. After consideration of risks, benefits and other options for treatment, the patient has consented to  Procedure(s): ?IRRIGATION AND DEBRIDEMENT RIGHT LEG (Right) as a surgical intervention.  The patient's history has been reviewed, patient examined, no change in status, stable for surgery.  I have reviewed the patient's chart and labs.  Questions were answered to the patient's satisfaction.   ? ? ?Deitra Mayo ? ? ?

## 2021-05-03 DIAGNOSIS — E1151 Type 2 diabetes mellitus with diabetic peripheral angiopathy without gangrene: Secondary | ICD-10-CM | POA: Diagnosis not present

## 2021-05-03 DIAGNOSIS — T8149XA Infection following a procedure, other surgical site, initial encounter: Secondary | ICD-10-CM

## 2021-05-03 LAB — BASIC METABOLIC PANEL
Anion gap: 11 (ref 5–15)
BUN: 19 mg/dL (ref 8–23)
CO2: 25 mmol/L (ref 22–32)
Calcium: 8.6 mg/dL — ABNORMAL LOW (ref 8.9–10.3)
Chloride: 97 mmol/L — ABNORMAL LOW (ref 98–111)
Creatinine, Ser: 0.81 mg/dL (ref 0.61–1.24)
GFR, Estimated: >60 mL/min (ref >60)
Glucose, Bld: 302 mg/dL — ABNORMAL HIGH (ref 70–99)
Potassium: 5 mmol/L (ref 3.5–5.1)
Sodium: 133 mmol/L — ABNORMAL LOW (ref 135–145)

## 2021-05-03 LAB — GLUCOSE, CAPILLARY
Glucose-Capillary: 278 mg/dL — ABNORMAL HIGH (ref 70–99)
Glucose-Capillary: 212 mg/dL — ABNORMAL HIGH (ref 70–99)
Glucose-Capillary: 241 mg/dL — ABNORMAL HIGH (ref 70–99)
Glucose-Capillary: 133 mg/dL — ABNORMAL HIGH (ref 70–99)

## 2021-05-03 LAB — CBC
HCT: 33.6 % — ABNORMAL LOW (ref 39.0–52.0)
Hemoglobin: 11.4 g/dL — ABNORMAL LOW (ref 13.0–17.0)
MCH: 33.8 pg (ref 26.0–34.0)
MCHC: 33.9 g/dL (ref 30.0–36.0)
MCV: 99.7 fL (ref 80.0–100.0)
Platelets: 345 10*3/uL (ref 150–400)
RBC: 3.37 MIL/uL — ABNORMAL LOW (ref 4.22–5.81)
RDW: 12.9 % (ref 11.5–15.5)
WBC: 9.7 10*3/uL (ref 4.0–10.5)
nRBC: 0 % (ref 0.0–0.2)

## 2021-05-03 MED ORDER — CLONAZEPAM 1 MG PO TABS
1.0000 mg | ORAL_TABLET | Freq: Once | ORAL | Status: AC
  Administered 2021-05-03: 1 mg via ORAL
  Filled 2021-05-03: qty 1

## 2021-05-03 MED ORDER — INSULIN ASPART 100 UNIT/ML IJ SOLN
6.0000 [IU] | Freq: Three times a day (TID) | INTRAMUSCULAR | Status: DC
  Administered 2021-05-03 – 2021-05-04 (×4): 6 [IU] via SUBCUTANEOUS

## 2021-05-03 MED ORDER — INSULIN ASPART 100 UNIT/ML IJ SOLN
0.0000 [IU] | Freq: Every day | INTRAMUSCULAR | Status: DC
  Administered 2021-05-05: 3 [IU] via SUBCUTANEOUS
  Administered 2021-05-06: 2 [IU] via SUBCUTANEOUS

## 2021-05-03 MED ORDER — SODIUM CHLORIDE 0.9% FLUSH: 10.0000 mL | INTRAVENOUS | Status: DC | PRN

## 2021-05-03 MED ORDER — DEXTROSE 50 % IV SOLN
1.0000 | INTRAVENOUS | Status: DC | PRN
Start: 2021-05-03 — End: 2021-05-07

## 2021-05-03 MED ORDER — MORPHINE SULFATE (PF) 4 MG/ML IV SOLN
4.0000 mg | Freq: Once | INTRAVENOUS | Status: AC
  Administered 2021-05-03: 4 mg via INTRAVENOUS
  Filled 2021-05-03: qty 1

## 2021-05-03 NOTE — Progress Notes (Signed)
?  Progress Note ? ? ? ?05/03/2021 ?9:17 AM ?1 Day Post-Op ? ?Subjective:  no complaints this morning ? ? ?Vitals:  ? 05/03/21 0754 05/03/21 0820  ?BP: 139/77 (!) 150/73  ?Pulse: 98   ?Resp: 20 18  ?Temp: 97.8 ?F (36.6 ?C)   ?SpO2: 95%   ? ?Physical Exam: ?Lungs:  non labored ?Incisions: Right groin incision with wound VAC in place with good seal; wound bed of right thigh incisions well-appearing with no further purulence or fat necrosis visualized; distal anastomosis not exposed ?Extremities: Feet are warm and well-perfused ?Neurologic: A&O ? ?CBC ?   ?Component Value Date/Time  ? WBC 9.7 05/03/2021 0204  ? RBC 3.37 (L) 05/03/2021 0204  ? HGB 11.4 (L) 05/03/2021 0204  ? HCT 33.6 (L) 05/03/2021 0204  ? PLT 345 05/03/2021 0204  ? MCV 99.7 05/03/2021 0204  ? MCH 33.8 05/03/2021 0204  ? MCHC 33.9 05/03/2021 0204  ? RDW 12.9 05/03/2021 0204  ? ? ?BMET ?   ?Component Value Date/Time  ? NA 133 (L) 05/03/2021 0204  ? NA 145 (H) 05/29/2020 1304  ? K 5.0 05/03/2021 0204  ? CL 97 (L) 05/03/2021 0204  ? CO2 25 05/03/2021 0204  ? GLUCOSE 302 (H) 05/03/2021 0204  ? BUN 19 05/03/2021 0204  ? BUN 16 05/29/2020 1304  ? CREATININE 0.81 05/03/2021 0204  ? CALCIUM 8.6 (L) 05/03/2021 0204  ? GFRNONAA >60 05/03/2021 0204  ? GFRAA 107 12/02/2017 1100  ? ? ?INR ?   ?Component Value Date/Time  ? INR 0.9 04/14/2021 1046  ? ? ? ?Intake/Output Summary (Last 24 hours) at 05/03/2021 0917 ?Last data filed at 05/03/2021 0700 ?Gross per 24 hour  ?Intake 1740 ml  ?Output 1770 ml  ?Net -30 ml  ? ? ? ? ?Assessment/Plan:  71 y.o. male is s/p incision and debridement of all incisions of right leg with placement of wound VAC in the right groin 1 Day Post-Op  ? ?Wet-to-dry dressing changed right thigh incisions.  Healthy appearing wound bed with no further purulence or fat necrosis noted.  There is muscle coverage over the distal anastomosis.  We will continue to change wet-to-dry dressing daily.  We will return to the OR on Monday for wound VAC change in  addition to wet-to-dry dressing change.  Intraoperative cultures pending.  Continue broad-spectrum antibiotics.  Okay to ambulate in the halls.   ? ?Hyperglycemia remains uncontrolled.  We will consult hospital medicine. ? ? ?Dagoberto Ligas, PA-C ?[facility] ?[phone] ?05/03/2021 ?9:17 AM ? ?VASCULAR STAFF ADDENDUM: ?I have independently interviewed and examined the patient. ?I agree with the above.  ?Surgical site with viable tissue, plan for VAC change in the OR Monday ? ?J. Melene Muller, MD ?[facility] ?Office Phone Number: [phone] ?05/03/2021 9:17 AM ? ? ?

## 2021-05-03 NOTE — Consult Note (Signed)
Initial Consultation Note   Patient: Michael [name] IEP:[id number] DOB: 1950/05/01 PCP: Carolee Rota, NP DOA: 04/28/2021 DOS: the patient was seen and examined on 05/03/2021 Primary service: Serafina Mitchell, MD  Referring physician: Orlie Pollen, MD Reason for consult: Diabetes management   Assessment and Plan: * Infected surgical wound Patient presented on 2/27 and was noted to have an infected wound of previous popliteal artery bypass graft with saphenous vein placed on 2/15.  Started on empiric antibiotics vancomycin and cefepime.  CT scan of the right leg on 3/2 noted subcutaneous gas tracking to the femoral vascular sheath.  Vascular to patient for excision and debridement of the right leg and placement of VAC dressing. -Follow-up cultures -Per vascular surgery  Diabetes mellitus, type 2 ([place]) Uncontrolled.  Last Hemoglobin A1c noted to be 8 on 2/15.  Current regimen includes Semglee16 units nightly, moderate sliding scale of NovoLog insulin with meals, and scheduled NovoLog 4 units with meals as long as patient eats at least 50% of the meal.  Diabetic education previously recommended increasing NovoLog to 6 units scheduled with meals.  Patient's blood sugars appear to be trending up at night.  This morning blood sugar elevated at 302 at 2 AM.  Consulted as patient needs better diabetes management for wound healing. -Increased scheduled NovoLog insulin to 6 units -Added on bedtime sliding scale of insulin of 0-5 units -Appreciate diabetic education consultative services -Consult dietitian for dietary education   [facility] will continue to follow the patient.  HPI: Michael [name] is a 71 y.o. male with past medical history of hypertension, hyperlipidemia, DM type II, OSA, and PAD s/p right femoral to above-knee popliteal artery bypass graft with saphenous vein on 2/15 who presented was admitted back into the hospital after developing redness and drainage of the right leg surgical site on  2/27 concerning for an infected right femoral-popliteal bypass graft.  Patient had initially been placed on empiric antibiotics.  CT scan of the right leg from 3/2 noted subcutaneous gas in the right medial thigh soft tissues extending to the femoral vascular sheath.  Patient was taken back to the operating room yesterday for excision and debridement of the right leg and placement of VAC dressings.  Blood sugars initially have been elevated up into the 476 on admission.  Home medication regimen includes Farxiga 5 mg daily, Dulaglutide 1.5 mg weekly, Humalog 34 units with PM meal, Lantus 16 units q HS, and Metformin XR 2000 mg q PM.  Patient's hemoglobin A1c was noted to be 8 last on 2/15.  Patient had initially been started on a moderate sliding scale insulin with meals and Semglee 16 units nightly.  Inpatient diabetes coordinator had been consulted and recommended patient to be started on scheduled NovoLog 4 units with meals.  Despite this his morning  glucose was elevated at 302. It had been recommended to increase mealtime coverage to 6 units with meals.  Patient reports that for a period in time his blood sugars have been around 127 for 90-day.  He was watching what he was eating, exercising regularly, and even lost weight.  However, meds also around the same time that he had worsening pain in his right leg especially with walking before the end of last year for which he was referred to vascular surgery.  Since that time patient reported that he had fallen off the wagon and has not always been eating what he should.  Patient endorses love of cheeses.  At home patient did report  intermittently having low blood sugars for which he normally would wake up with sweats, but states that this has not been recently occurring.  He is followed by Dr. Buddy Duty of endocrinology in the outpatient setting.  Previously, he had gone to a [employer] class with a dietitian, and was made aware of the things that he was eating likely  raising his blood sugar.  He also reports a history of anxiety related to being locked in a casket in high school which has led to him to begin counseling and use medications such as Klonopin to treat symptoms.  He reports being under a lot of stress and anxiety since being here in the hospital as he had never been hospitalized previously in his life except for being born.  Review of Systems: As mentioned in the history of present illness. All other systems reviewed and are negative. Past Medical History:  Diagnosis Date   Allergy    Anxiety    Diabetes mellitus    GERD (gastroesophageal reflux disease)    Hyperlipidemia    Hypertension    PAD (peripheral artery disease) ([place])    a. s/p prior LE stenting 2012, 03/2017 - followed by [facility].   Prostate cancer ([place])    Sleep apnea 2016   wears CPAP sometimes   Past Surgical History:  Procedure Laterality Date   ABDOMINAL AORTOGRAM N/A 09/01/2016   Procedure: Abdominal Aortogram;  Surgeon: Serafina Mitchell, MD;  Location: [facility];  Service: Cardiovascular;  Laterality: N/A;   ABDOMINAL AORTOGRAM W/LOWER EXTREMITY N/A 03/16/2017   Procedure: ABDOMINAL AORTOGRAM W/LOWER EXTREMITY;  Surgeon: Serafina Mitchell, MD;  Location: [place] [facility];  Service: Cardiovascular;  Laterality: N/A;   ANGIOPLASTY Right 01/30/2021   Procedure: BALLOON ANGIOPLASTY;  Surgeon: Serafina Mitchell, MD;  Location: [facility];  Service: Vascular;  Laterality: Right;   ANGIOPLASTY / STENTING FEMORAL  11/05/2010   Left SFA stent   APPLICATION OF WOUND VAC Right 04/16/2021   Procedure: APPLICATION OF WOUND VAC;  Surgeon: Serafina Mitchell, MD;  Location: [facility];  Service: Vascular;  Laterality: Right;   ENDARTERECTOMY FEMORAL Right 04/16/2021   Procedure: RIGHT FEMORAL ENDARTERECTOMY;  Surgeon: Serafina Mitchell, MD;  Location: [facility] OR;  Service: Vascular;  Laterality: Right;   FEMORAL-POPLITEAL BYPASS GRAFT Right 04/16/2021   Procedure: RIGHT FEMORAL-POPLITEAL BYPASS USING  VEIN;  Surgeon: Serafina Mitchell, MD;  Location: [facility] OR;  Service: Vascular;  Laterality: Right;   KNEE ARTHROSCOPY     LOWER EXTREMITY ANGIOGRAM Right 01/30/2021   Procedure: LOWER EXTREMITY ANGIOGRAM WITH ARTERIOGRAM OF RIGHT LOWER EXTREMITY;  Surgeon: Serafina Mitchell, MD;  Location: [facility];  Service: Vascular;  Laterality: Right;   LOWER EXTREMITY ANGIOGRAPHY N/A 09/01/2016   Procedure: Lower Extremity Angiography;  Surgeon: Serafina Mitchell, MD;  Location: [place];  Service: Cardiovascular;  Laterality: N/A;   PERIPHERAL VASCULAR ATHERECTOMY Right 09/01/2016   Procedure: Peripheral Vascular Atherectomy;  Surgeon: Serafina Mitchell, MD;  Location: [facility];  Service: Cardiovascular;  Laterality: Right;  superficial femoral   PERIPHERAL VASCULAR INTERVENTION Right 03/16/2017   Procedure: PERIPHERAL VASCULAR INTERVENTION;  Surgeon: Serafina Mitchell, MD;  Location: [place] [facility];  Service: Cardiovascular;  Laterality: Right;  superficicial femoral   ROTATOR CUFF REPAIR     1990   ROTATOR CUFF REPAIR Bilateral 2017   ULTRASOUND GUIDANCE FOR VASCULAR ACCESS Left 01/30/2021   Procedure: ULTRASOUND GUIDANCE FOR VASCULAR ACCESS;  Surgeon: Trula Slade,  Butch Penny, MD;  Location: [facility] OR;  Service: Vascular;  Laterality: Left;   VEIN HARVEST Right 04/16/2021   Procedure: RIGHT GREATER SAPHENOUS VEIN HARVEST;  Surgeon: Serafina Mitchell, MD;  Location: [facility];  Service: Vascular;  Laterality: Right;   Social History:  reports that he has never smoked. He quit smokeless tobacco use about 39 years ago. He reports current alcohol use. He reports that he does not use drugs.  Allergies  Allergen Reactions   Demerol Anaphylaxis   Lactose Intolerance (Gi) Anaphylaxis   Oxycodone Anxiety and Other (See Comments)    Suicidal ideation   Penicillins Anaphylaxis and Other (See Comments)   Actos [Pioglitazone] Nausea Only   Exenatide Nausea Only and Other (See Comments)   Glimepiride Other (See  Comments)    Edema in ankles    Nabumetone Nausea Only and Other (See Comments)   Victoza [Liraglutide] Nausea Only    Shaking   Fentanyl Anxiety    Jittery    Hydrocodone Anxiety    Suicidal ideation    Family History  Problem Relation Age of Onset   Hyperlipidemia Mother    Hypertension Mother    Heart disease Father    Deep vein thrombosis Father    Hyperlipidemia Father    Hypertension Father    Heart attack Father    Peripheral vascular disease Father    Cancer Sister     Prior to Admission medications   Medication Sig Start Date End Date Taking? Authorizing Provider  acetaminophen (TYLENOL) 500 MG tablet Take 1,000 mg by mouth every 6 (six) hours as needed for mild pain.   Yes [provider]  amLODipine (NORVASC) 10 MG tablet Take 10 mg by mouth daily. 02/12/20  Yes [provider]  atorvastatin (LIPITOR) 40 MG tablet Take 40 mg by mouth at bedtime. 01/10/15  Yes [provider]  benazepril (LOTENSIN) 40 MG tablet Take 40 mg by mouth daily.   Yes [provider]  clonazePAM (KLONOPIN) 1 MG tablet Take 1 mg by mouth at bedtime as needed (sleep). 02/24/21  Yes [provider]  clopidogrel (PLAVIX) 75 MG tablet TAKE 1 TABLET BY MOUTH EVERY DAY WITH BREAKFAST Patient taking differently: Take 75 mg by mouth daily. 03/03/20  Yes Angelia Mould, MD  dapagliflozin propanediol (FARXIGA) 5 MG TABS tablet 5 mg daily. 04/04/19  Yes [provider]  Dulaglutide 1.5 MG/0.5ML SOPN Inject 1.5 mg into the skin once a week. Sunday 09/15/18  Yes [provider]  famotidine (PEPCID) 20 MG tablet Take 20 mg by mouth 2 (two) times daily.   Yes [provider]  fluticasone (FLONASE) 50 MCG/ACT nasal spray Place 2 sprays into both nostrils at bedtime as needed for allergies (before uses CPAP if needed).   Yes [provider]  folic acid (FOLVITE) 1 MG tablet Take 1 mg by mouth daily.   Yes [provider]  HUMALOG KWIKPEN 100 UNIT/ML KwikPen Inject 34 Units into the skin 2 (two) times daily. 04/18/18  Yes [provider]  hydrochlorothiazide (HYDRODIURIL) 25 MG tablet Take 1 tablet (25 mg total) by mouth daily. Please make overdue appt with Dr. Irish Lack before anymore refills. 2nd attempt 12/06/19  Yes Jettie Booze, MD  HYDROcodone-acetaminophen (NORCO) 5-325 MG tablet Take 1 tablet by mouth every 6 (six) hours as needed for moderate pain. 04/18/21  Yes Rhyne, Hulen Shouts, PA-C  ibuprofen (ADVIL,MOTRIN) 200 MG tablet Take 400-800 mg by mouth daily as needed for headache.  Yes [provider]  insulin glargine (LANTUS) 100 UNIT/ML injection Inject 10 Units into the skin at bedtime.   Yes [provider]  metFORMIN (GLUCOPHAGE-XR) 500 MG 24 hr tablet Take 2,000 mg by mouth at bedtime. 12/03/17  Yes [provider]  methotrexate 2.5 MG tablet Take 15 mg by mouth every Tuesday. 6 tablets weekly 02/02/20  Yes [provider]  metoprolol succinate (TOPROL-XL) 100 MG 24 hr tablet Take 100 mg by mouth daily.  12/27/15  Yes [provider]  solifenacin (VESICARE) 5 MG tablet Take 5 mg by mouth daily. 03/04/21  Yes [provider]  tamsulosin (FLOMAX) 0.4 MG CAPS capsule Take 0.4 mg by mouth at bedtime. 12/31/14  Yes [provider]  traZODone (DESYREL) 50 MG tablet Take 50 mg by mouth at bedtime as needed for sleep. 04/24/21  Yes [provider]  [place] test strip AS DIRECTED SEVEN TIMES A DAY IN VITRO 90 DAYS 01/06/16   [provider]  Continuous Blood Gluc Receiver (FREESTYLE LIBRE 14 DAY READER) DEVI use to monitor blood sugar 08/17/17   [provider]    Physical Exam: Vitals:   05/02/21 2330 05/03/21 0529 05/03/21 0754 05/03/21 0820  BP: 119/69 135/76 139/77 (!) 150/73  Pulse: 78 82 98   Resp: '19 13 20 18  '$ Temp: 98 F (36.7 C) 97.8 F (36.6 C) 97.8 F (36.6 C)   TempSrc: Oral Oral  Oral   SpO2: 95% 94% 95%   Constitutional: Elderly male who appears to little shaky, but in no acute able to follow commands  Eyes: PERRL, lids and conjunctivae normal ENMT: Mucous membranes are moist. Posterior pharynx clear of any exudate or lesions Neck: normal, supple, no masses, no thyromegaly Respiratory: Normal respiratory effort with mild crackles on the right upper lobe that resolved with repeat deep inspiratory efforts. Cardiovascular: Regular rate and rhythm  Psychiatric: Normal judgment and insight. Alert and oriented x 3. Normal mood.    Data Reviewed:    EKG noted normal sinus rhythm at 97 bpm    Family Communication: Wife updated at bedside Primary team communication: Thank you very much for involving [place] in the care of your patient.  Author: Norval Morton, MD 05/03/2021 9:07 AM  For on call review [email or web address].

## 2021-05-03 NOTE — Progress Notes (Signed)
?  Progress Note ? ? ? ?05/03/2021 ?8:26 AM ?1 Day Post-Op ? ?Subjective:  no complaints this morning ? ? ?Vitals:  ? 05/03/21 0754 05/03/21 0820  ?BP: 139/77 (!) 150/73  ?Pulse: 98   ?Resp: 20 18  ?Temp: 97.8 ?F (36.6 ?C)   ?SpO2: 95%   ? ?Physical Exam: ?Lungs:  non labored ?Incisions: Right groin incision with wound VAC in place with good seal; wound bed of right thigh incisions well-appearing with no further purulence or fat necrosis visualized; distal anastomosis not exposed ?Extremities: Feet are warm and well-perfused ?Neurologic: A&O ? ?CBC ?   ?Component Value Date/Time  ? WBC 9.7 05/03/2021 0204  ? RBC 3.37 (L) 05/03/2021 0204  ? HGB 11.4 (L) 05/03/2021 0204  ? HCT 33.6 (L) 05/03/2021 0204  ? PLT 345 05/03/2021 0204  ? MCV 99.7 05/03/2021 0204  ? MCH 33.8 05/03/2021 0204  ? MCHC 33.9 05/03/2021 0204  ? RDW 12.9 05/03/2021 0204  ? ? ?BMET ?   ?Component Value Date/Time  ? NA 133 (L) 05/03/2021 0204  ? NA 145 (H) 05/29/2020 1304  ? K 5.0 05/03/2021 0204  ? CL 97 (L) 05/03/2021 0204  ? CO2 25 05/03/2021 0204  ? GLUCOSE 302 (H) 05/03/2021 0204  ? BUN 19 05/03/2021 0204  ? BUN 16 05/29/2020 1304  ? CREATININE 0.81 05/03/2021 0204  ? CALCIUM 8.6 (L) 05/03/2021 0204  ? GFRNONAA >60 05/03/2021 0204  ? GFRAA 107 12/02/2017 1100  ? ? ?INR ?   ?Component Value Date/Time  ? INR 0.9 04/14/2021 1046  ? ? ? ?Intake/Output Summary (Last 24 hours) at 05/03/2021 0826 ?Last data filed at 05/03/2021 0700 ?Gross per 24 hour  ?Intake 1740 ml  ?Output 1770 ml  ?Net -30 ml  ? ? ? ?Assessment/Plan:  71 y.o. male is s/p incision and debridement of all incisions of right leg with placement of wound VAC in the right groin 1 Day Post-Op  ? ?Wet-to-dry dressing changed right thigh incisions.  Healthy appearing wound bed with no further purulence or fat necrosis noted.  There is muscle coverage over the distal anastomosis.  We will continue to change wet-to-dry dressing daily.  We will return to the OR on Monday for wound VAC change in  addition to wet-to-dry dressing change.  Intraoperative cultures pending.  Continue broad-spectrum antibiotics.  Okay to ambulate in the halls.   ? ?Hyperglycemia remains uncontrolled.  We will consult hospital medicine. ? ? ?Dagoberto Ligas, PA-C ?[facility] ?[phone] ?05/03/2021 ?8:26 AM ? ? ? ?

## 2021-05-03 NOTE — Assessment & Plan Note (Addendum)
-  Patient presented on 2/27 and was noted to have an infected wound of previous popliteal artery bypass graft with saphenous vein placed on 2/15.   ?-Started on empiric antibiotics vancomycin and cefepime. Being transitioned to po Abx by Vascular Surgery  ?-CT scan of the right leg on 3/2 noted subcutaneous gas tracking to the femoral vascular sheath.   ?-Vascular took patient for excision and debridement of the right leg and placement of VAC dressing. ?-Per Vascular the VAC was taken down this AM and wet to dry reapplied and WOC team consulted to reapply Vac; Per Vascular RLE is well perfused ?-Follow-up cultures: Showing Few WBC present with Cx growing Few Prevotella Species, Rare Bacteroides Vulgatus, and Beta Lactamase Positive ?-Care Per vascular surgery ?

## 2021-05-03 NOTE — Progress Notes (Signed)
Pharmacy Antibiotic Note ? ?Michael [name] is a 71 y.o. male admitted on 04/28/2021 with  s/p R femoral above-knee popliteal artery bypass graph on 04/16/2021 now with redness and drainage.  Pharmacy has been consulted for vancomycin and cefepime dosing for concern of infected R femoral-popliteal bypass graft. Patient reported PCN allergy - back when 71 years old given PCN in doctor's office and started choking, so patient started on cefepime instead of Zosyn. ? ?Serum creatinine has improved greatly since admission, now at 0.81. Patient has remained afebrile. Vancomycin trough drawn on 3/3 at 0932 was appropriate at 15. Of note, dose due after trough was drawn was not given due to patient in surgery. ? ?Current vancomycin dose and kinetics based on renal function: ?Vancomycin 1000 mg IV Q 12 hr ?Scr used: 0.81 mg/dL ?Weight: 100.7 kg > IBW 77.6 ?Vd coeff: 0.5 L/kg for BMI >30 ?Est AUC: 499.7 ? ?Plan: ?Continue cefepime 2 g IV q8h ?Continue vancomycin 1000 mg IV q12h ?Monitor renal function, cultures, clinical course ?Vancomycin levels as indicated ?Follow-up length of therapy ? ? ?Temp (24hrs), Avg:97.9 ?F (36.6 ?C), Min:97.6 ?F (36.4 ?C), Max:98.5 ?F (36.9 ?C) ? ?Recent Labs  ?Lab 04/28/21 ?1807 04/29/21 ?5374 04/30/21 ?0128 05/01/21 ?0145 05/02/21 ?0153 05/02/21 ?8270 05/03/21 ?7867  ?WBC 12.0*  --  8.6 6.5 6.9  --  9.7  ?CREATININE 1.49* 1.20 0.87 0.82 0.98  --  0.81  ?[place]  --   --   --   --   --  15  --   ?  ?Estimated Creatinine Clearance: 101.2 mL/min (by C-G formula based on SCr of 0.81 mg/dL).   ? ?Allergies  ?Allergen Reactions  ? Demerol Anaphylaxis  ? Lactose Intolerance (Gi) Anaphylaxis  ? Oxycodone Anxiety and Other (See Comments)  ?  Suicidal ideation  ? Penicillins Anaphylaxis and Other (See Comments)  ? Actos [Pioglitazone] Nausea Only  ? Exenatide Nausea Only and Other (See Comments)  ? Glimepiride Other (See Comments)  ?  Edema in ankles ?  ? Nabumetone Nausea Only and Other (See Comments)   ? Victoza [Liraglutide] Nausea Only  ?  Shaking  ? Fentanyl Anxiety  ?  Jittery   ? Hydrocodone Anxiety  ?  Suicidal ideation  ? ? ?Antimicrobials this admission: ?Cefepime 2/27 >>  ?Vanc 2/27 >>  ? ?Dose adjustments this admission: ?2/28 Cefepime adjusted for improved renal fxn ?3/1 Vanc adjusted for improved renal fxn ? ?Microbiology results: ?3/3 wound cx: in process ? ?Thank you for allowing pharmacy to be a part of this patientMichaels care. ? ?Zenaida Deed, PharmD ?PGY1 Acute Care Pharmacy Resident  ?Phone: [phone] ?05/03/2021  12:58 PM ? ?Please check [email or web address] for unit-specific pharmacy phone numbers. ? ? ?

## 2021-05-03 NOTE — Progress Notes (Signed)
Inpatient Diabetes Program Recommendations ? ?AACE/ADA: New Consensus Statement on Inpatient Glycemic Control (2015) ? ?Target Ranges:  Prepandial:   less than 140 mg/dL ?     Peak postprandial:   less than 180 mg/dL (1-2 hours) ?     Critically ill patients:  140 - 180 mg/dL  ? ?Lab Results  ?Component Value Date  ? GLUCAP 278 (H) 05/03/2021  ? HGBA1C 8.0 (H) 04/16/2021  ? ? ?Review of Glycemic Control ? ?Blood sugars still above goal of 140-180 mg/dL. ?Need tight glycemic control for healing. ?HgbA1C - 8.0% ? ?Current orders for Inpatient glycemic control: Semglee 16 units QHS, Novolog 0-15 units TID + 6 units TID ? ?Inpatient Diabetes Program Recommendations:   ? ?Add Novolog HS correction ? ?If post-prandials continue over 180 mg/dL, increase Novolog to 8 units TID ? ?Will follow glucose trends. ? ?Thank you. ?Lorenda Peck, RD, LDN, CDE ?Inpatient Diabetes Coordinator ?[phone]  ? ? ? ? ?

## 2021-05-03 NOTE — Assessment & Plan Note (Addendum)
-  Uncontrolled.   ?-Last Hemoglobin A1c noted to be 8 on 2/15.  -Current regimen includes Semglee 27 units nightly, Novolog Moderated AC/HS and Novolog 15 units TID with meals ?-Appreciate of diabetic education consultative services; Recommend outpatient follow up ?-Resume home medications of Farxiga 5 mg p.o. daily, do outside 1.5 mg weekly, Humalog 34 units twice daily, Lantus 10 units daily, and metformin XR 2000 mg p.o. nightly as an out patient with close PCP follow-up ?-CBGs now ranging from 114-256 ?-Consult dietitian for dietary education ?

## 2021-05-04 DIAGNOSIS — T8149XA Infection following a procedure, other surgical site, initial encounter: Secondary | ICD-10-CM | POA: Diagnosis not present

## 2021-05-04 LAB — CBC
HCT: 30.4 % — ABNORMAL LOW (ref 39.0–52.0)
Hemoglobin: 9.8 g/dL — ABNORMAL LOW (ref 13.0–17.0)
MCH: 33.2 pg (ref 26.0–34.0)
MCHC: 32.2 g/dL (ref 30.0–36.0)
MCV: 103.1 fL — ABNORMAL HIGH (ref 80.0–100.0)
Platelets: 321 10*3/uL (ref 150–400)
RBC: 2.95 MIL/uL — ABNORMAL LOW (ref 4.22–5.81)
RDW: 13.2 % (ref 11.5–15.5)
WBC: 7.6 10*3/uL (ref 4.0–10.5)
nRBC: 0 % (ref 0.0–0.2)

## 2021-05-04 LAB — BASIC METABOLIC PANEL
Anion gap: 9 (ref 5–15)
BUN: 18 mg/dL (ref 8–23)
CO2: 28 mmol/L (ref 22–32)
Calcium: 8.4 mg/dL — ABNORMAL LOW (ref 8.9–10.3)
Chloride: 97 mmol/L — ABNORMAL LOW (ref 98–111)
Creatinine, Ser: 0.93 mg/dL (ref 0.61–1.24)
GFR, Estimated: >60 mL/min (ref >60)
Glucose, Bld: 232 mg/dL — ABNORMAL HIGH (ref 70–99)
Potassium: 3.6 mmol/L (ref 3.5–5.1)
Sodium: 134 mmol/L — ABNORMAL LOW (ref 135–145)

## 2021-05-04 LAB — GLUCOSE, CAPILLARY
Glucose-Capillary: 225 mg/dL — ABNORMAL HIGH (ref 70–99)
Glucose-Capillary: 208 mg/dL — ABNORMAL HIGH (ref 70–99)
Glucose-Capillary: 224 mg/dL — ABNORMAL HIGH (ref 70–99)
Glucose-Capillary: 147 mg/dL — ABNORMAL HIGH (ref 70–99)
Glucose-Capillary: 137 mg/dL — ABNORMAL HIGH (ref 70–99)

## 2021-05-04 MED ORDER — INSULIN ASPART 100 UNIT/ML IJ SOLN
10.0000 [IU] | Freq: Three times a day (TID) | INTRAMUSCULAR | Status: DC
  Administered 2021-05-04 – 2021-05-06 (×3): 10 [IU] via SUBCUTANEOUS

## 2021-05-04 MED ORDER — CLONAZEPAM 1 MG PO TABS
1.0000 mg | ORAL_TABLET | Freq: Two times a day (BID) | ORAL | Status: DC | PRN
Start: 2021-05-04 — End: 2021-05-07
  Administered 2021-05-04 – 2021-05-07 (×6): 1 mg via ORAL
  Filled 2021-05-04 (×7): qty 1

## 2021-05-04 NOTE — Progress Notes (Signed)
PROGRESS NOTE    Michael [name]  TFT:[id number] DOB: [birth year] DOA: 04/28/2021 PCP: Carolee Rota, NP    Brief Narrative:   71 y.o. male is s/p incision and debridement of all incisions of right leg with placement of wound VAC in the right groin.  TRH consulted for diabetes management  Subjective: Has no abd pain/n/v/dizziness  Objective: Vitals:   05/04/21 0322 05/04/21 0500 05/04/21 0741 05/04/21 1100  BP: (!) 143/65  (!) 142/77 110/81  Pulse: 77  88 82  Resp: '20  17 19  '$ Temp: 98.2 F (36.8 C)  98 F (36.7 C) 97.9 F (36.6 C)  TempSrc: Oral  Oral Oral  SpO2: 96%  97% 98%  Weight:  107.7 kg      Intake/Output Summary (Last 24 hours) at 05/04/2021 1426 Last data filed at 05/04/2021 1123 Gross per 24 hour  Intake 2163.6 ml  Output 1385 ml  Net 778.6 ml   Filed Weights   05/04/21 0500  Weight: 107.7 kg    Examination:  Calm, NAD Cta no w/r Reg s1/s2 no gallop Soft benign +bs Rle edema >L,wound vac in place Aaoxox3  Mood and affect appropriate in current setting     Data Reviewed: I have personally reviewed following labs and imaging studies  CBC: Recent Labs  Lab 04/30/21 0128 05/01/21 0145 05/02/21 0153 05/03/21 0204 05/04/21 0315  WBC 8.6 6.5 6.9 9.7 7.6  HGB 10.6* 11.2* 11.3* 11.4* 9.8*  HCT 32.5* 33.4* 34.7* 33.6* 30.4*  MCV 101.2* 100.9* 101.5* 99.7 103.1*  PLT 254 285 335 345 706   Basic Metabolic Panel: Recent Labs  Lab 04/30/21 0128 05/01/21 0145 05/02/21 0153 05/03/21 0204 05/04/21 0315  NA 136 135 135 133* 134*  K 3.6 3.9 3.3* 5.0 3.6  CL 101 101 100 97* 97*  CO2 '25 22 26 25 28  '$ GLUCOSE 85 261* 186* 302* 232*  BUN '22 17 15 19 18  '$ CREATININE 0.87 0.82 0.98 0.81 0.93  CALCIUM 8.6* 8.6* 8.6* 8.6* 8.4*   GFR: Estimated Creatinine Clearance: 91 mL/min (by C-G formula based on SCr of 0.93 mg/dL). Liver Function Tests: No results for input(s): AST, ALT, ALKPHOS, BILITOT, PROT, ALBUMIN in the last 168 hours. No results for  input(s): LIPASE, AMYLASE in the last 168 hours. No results for input(s): AMMONIA in the last 168 hours. Coagulation Profile: No results for input(s): INR, PROTIME in the last 168 hours. Cardiac Enzymes: No results for input(s): CKTOTAL, CKMB, CKMBINDEX, TROPONINI in the last 168 hours. BNP (last 3 results) No results for input(s): PROBNP in the last 8760 hours. HbA1C: No results for input(s): HGBA1C in the last 72 hours. CBG: Recent Labs  Lab 05/03/21 1218 05/03/21 1625 05/03/21 2101 05/04/21 0519 05/04/21 1151  GLUCAP 212* 241* 133* 225* 208*   Lipid Profile: No results for input(s): CHOL, HDL, LDLCALC, TRIG, CHOLHDL, LDLDIRECT in the last 72 hours. Thyroid Function Tests: No results for input(s): TSH, T4TOTAL, FREET4, T3FREE, THYROIDAB in the last 72 hours. Anemia Panel: No results for input(s): VITAMINB12, FOLATE, FERRITIN, TIBC, IRON, RETICCTPCT in the last 72 hours. Sepsis Labs: No results for input(s): PROCALCITON, LATICACIDVEN in the last 168 hours.  Recent Results (from the past 240 hour(s))  Resp Panel by RT-PCR (Flu A&B, Covid) Nasopharyngeal Swab     Status: None   Collection Time: 04/28/21  6:40 PM   Specimen: Nasopharyngeal Swab; Nasopharyngeal(NP) swabs in vial transport medium  Result Value Ref Range Status   SARS Coronavirus 2 by RT PCR  NEGATIVE NEGATIVE Final    Comment: (NOTE) SARS-CoV-2 target nucleic acids are NOT DETECTED.  The SARS-CoV-2 RNA is generally detectable in upper respiratory specimens during the acute phase of infection. The lowest concentration of SARS-CoV-2 viral copies this assay can detect is 138 copies/mL. A negative result does not preclude SARS-Cov-2 infection and should not be used as the sole basis for treatment or other patient management decisions. A negative result may occur with  improper specimen collection/handling, submission of specimen other than nasopharyngeal swab, presence of viral mutation(s) within the areas  targeted by this assay, and inadequate number of viral copies(<138 copies/mL). A negative result must be combined with clinical observations, patient history, and epidemiological information. The expected result is Negative.  Fact Sheet for Patients:  EntrepreneurPulse.com.au  Fact Sheet for Healthcare Providers:  IncredibleEmployment.be  This test is no t yet approved or cleared by the [place] FDA and  has been authorized for detection and/or diagnosis of SARS-CoV-2 by FDA under an Emergency Use Authorization (EUA). This EUA will remain  in effect (meaning this test can be used) for the duration of the COVID-19 declaration under Section 564(b)(1) of the Act, 21 U.S.C.section 360bbb-3(b)(1), unless the authorization is terminated  or revoked sooner.       Influenza A by PCR NEGATIVE NEGATIVE Final   Influenza B by PCR NEGATIVE NEGATIVE Final    Comment: (NOTE) The Xpert Xpress SARS-CoV-2/FLU/RSV plus assay is intended as an aid in the diagnosis of influenza from Nasopharyngeal swab specimens and should not be used as a sole basis for treatment. Nasal washings and aspirates are unacceptable for Xpert Xpress SARS-CoV-2/FLU/RSV testing.  Fact Sheet for Patients: EntrepreneurPulse.com.au  Fact Sheet for Healthcare Providers: IncredibleEmployment.be  This test is not yet approved or cleared by the [place] FDA and has been authorized for detection and/or diagnosis of SARS-CoV-2 by FDA under an Emergency Use Authorization (EUA). This EUA will remain in effect (meaning this test can be used) for the duration of the COVID-19 declaration under Section 564(b)(1) of the Act, 21 U.S.C. section 360bbb-3(b)(1), unless the authorization is terminated or revoked.  Performed at [facility], [place] [address], [place], [place] [address]   Aerobic/Anaerobic Culture w Gram Stain (surgical/deep wound)      Status: None (Preliminary result)   Collection Time: 05/02/21  1:00 PM   Specimen: Wound  Result Value Ref Range Status   Specimen Description WOUND  Final   Special Requests RIGHT LEG WOUND SPEC A  Final   Gram Stain   Final    FEW WBC PRESENT, PREDOMINANTLY MONONUCLEAR NO ORGANISMS SEEN    Culture   Final    HOLDING FOR POSSIBLE ANAEROBE Performed at [facility], [place] [address], [place], [place] [address]    Report Status PENDING  Incomplete         Radiology Studies: No results found.      Scheduled Meds:  amLODipine  10 mg Oral Daily   atorvastatin  40 mg Oral QHS   benazepril  40 mg Oral Daily   clopidogrel  75 mg Oral Daily   heparin  5,000 Units Subcutaneous Q8H   hydrochlorothiazide  25 mg Oral Daily   insulin aspart  0-15 Units Subcutaneous TID WC   insulin aspart  0-5 Units Subcutaneous QHS   insulin aspart  6 Units Subcutaneous TID WC   insulin glargine-yfgn  16 Units Subcutaneous QHS   metoprolol succinate  100 mg Oral Daily   pantoprazole  40 mg Oral Daily   tamsulosin  0.4 mg Oral QHS   Continuous Infusions:  ceFEPime (MAXIPIME) IV 2 g (05/04/21 1320)   vancomycin 1,000 mg (05/04/21 1123)    Assessment & Plan:   Principal Problem:   Infected surgical wound Active Problems:   Diabetes mellitus, type 2 ([place])   Infected surgical wound Patient presented on 2/27 and was noted to have an infected wound of previous popliteal artery bypass graft with saphenous vein placed on 2/15.  Started on empiric antibiotics vancomycin and cefepime.  CT scan of the right leg on 3/2 noted subcutaneous gas tracking to the femoral vascular sheath.  Vascular to patient for excision and debridement of the right leg and placement of VAC dressing. 3/5 wound cx pending Management per primary team Continue cefepime and vancomy   Diabetes mellitus, type 2 ([place]) Uncontrolled.  Last Hemoglobin A1c 8 on 2/15.   3/5 diabetic educator following BG variable Increase  meal novolog to 10 units tid with meals  DVT prophylaxis: Heparin                LOS: 6 days   Time spent: 35 min     Nolberto Hanlon, MD [facility] Pager [phone]  If 7PM-7AM, please contact night-coverage 05/04/2021, 2:26 PM

## 2021-05-04 NOTE — Progress Notes (Signed)
?  Progress Note ? ? ? ?05/04/2021 ?9:07 AM ?2 Days Post-Op ? ?Subjective:  anxiety worsening overnight ? ? ?Vitals:  ? 05/04/21 0322 05/04/21 0741  ?BP: (!) 143/65 (!) 142/77  ?Pulse: 77 88  ?Resp: 20 17  ?Temp: 98.2 ?F (36.8 ?C) 98 ?F (36.7 ?C)  ?SpO2: 96% 97%  ? ?Physical Exam: ?Lungs:  non labored ?Incisions:  R groin vac with good seal; dressing left in place R thigh  ?Extremities:  R foot warm and well perfused ?Neurologic: A&O ? ?CBC ?   ?Component Value Date/Time  ? WBC 7.6 05/04/2021 0315  ? RBC 2.95 (L) 05/04/2021 0315  ? HGB 9.8 (L) 05/04/2021 0315  ? HCT 30.4 (L) 05/04/2021 0315  ? PLT 321 05/04/2021 0315  ? MCV 103.1 (H) 05/04/2021 0315  ? MCH 33.2 05/04/2021 0315  ? MCHC 32.2 05/04/2021 0315  ? RDW 13.2 05/04/2021 0315  ? ? ?BMET ?   ?Component Value Date/Time  ? NA 134 (L) 05/04/2021 0315  ? NA 145 (H) 05/29/2020 1304  ? K 3.6 05/04/2021 0315  ? CL 97 (L) 05/04/2021 0315  ? CO2 28 05/04/2021 0315  ? GLUCOSE 232 (H) 05/04/2021 0315  ? BUN 18 05/04/2021 0315  ? BUN 16 05/29/2020 1304  ? CREATININE 0.93 05/04/2021 0315  ? CALCIUM 8.4 (L) 05/04/2021 0315  ? GFRNONAA >60 05/04/2021 0315  ? GFRAA 107 12/02/2017 1100  ? ? ?INR ?   ?Component Value Date/Time  ? INR 0.9 04/14/2021 1046  ? ? ? ?Intake/Output Summary (Last 24 hours) at 05/04/2021 0907 ?Last data filed at 05/04/2021 0600 ?Gross per 24 hour  ?Intake 2163.6 ml  ?Output 1235 ml  ?Net 928.6 ml  ? ? ? ? ?Assessment/Plan:  71 y.o. male is s/p incision and debridement of all incisions of right leg with placement of wound VAC in the right groin 2 Days Post-Op  ? ?Increase klonopin to BID prn for anxiety.  Nursing staff will change wet to dry to R thigh later today.  Intra-operative cultures pending, no growth to date.  Continue IV abx.  Plan is for return to OR tomorrow for vac change and further debridement.  NPO past midnight.  Consent ordered.  Appreciate TRH help with hyperglycemia. ? ? ?Dagoberto Ligas, PA-C ?[facility] ?[phone] ?05/04/2021 ?9:07 AM ? ? ?VASCULAR STAFF ADDENDUM: ?I have independently interviewed and examined the patient. ?I agree with the above.  ? ? ?J. Melene Muller, MD ?[facility] ?Office Phone Number: [phone] ?05/04/2021 9:07 AM ? ? ?

## 2021-05-04 NOTE — Progress Notes (Signed)
?  Progress Note ? ? ? ?05/04/2021 ?8:48 AM ?2 Days Post-Op ? ?Subjective:  anxiety worsening overnight ? ? ?Vitals:  ? 05/04/21 0322 05/04/21 0741  ?BP: (!) 143/65 (!) 142/77  ?Pulse: 77 88  ?Resp: 20 17  ?Temp: 98.2 ?F (36.8 ?C) 98 ?F (36.7 ?C)  ?SpO2: 96% 97%  ? ?Physical Exam: ?Lungs:  non labored ?Incisions:  R groin vac with good seal; dressing left in place R thigh  ?Extremities:  R foot warm and well perfused ?Neurologic: A&O ? ?CBC ?   ?Component Value Date/Time  ? WBC 7.6 05/04/2021 0315  ? RBC 2.95 (L) 05/04/2021 0315  ? HGB 9.8 (L) 05/04/2021 0315  ? HCT 30.4 (L) 05/04/2021 0315  ? PLT 321 05/04/2021 0315  ? MCV 103.1 (H) 05/04/2021 0315  ? MCH 33.2 05/04/2021 0315  ? MCHC 32.2 05/04/2021 0315  ? RDW 13.2 05/04/2021 0315  ? ? ?BMET ?   ?Component Value Date/Time  ? NA 134 (L) 05/04/2021 0315  ? NA 145 (H) 05/29/2020 1304  ? K 3.6 05/04/2021 0315  ? CL 97 (L) 05/04/2021 0315  ? CO2 28 05/04/2021 0315  ? GLUCOSE 232 (H) 05/04/2021 0315  ? BUN 18 05/04/2021 0315  ? BUN 16 05/29/2020 1304  ? CREATININE 0.93 05/04/2021 0315  ? CALCIUM 8.4 (L) 05/04/2021 0315  ? GFRNONAA >60 05/04/2021 0315  ? GFRAA 107 12/02/2017 1100  ? ? ?INR ?   ?Component Value Date/Time  ? INR 0.9 04/14/2021 1046  ? ? ? ?Intake/Output Summary (Last 24 hours) at 05/04/2021 0848 ?Last data filed at 05/04/2021 0600 ?Gross per 24 hour  ?Intake 2163.6 ml  ?Output 1235 ml  ?Net 928.6 ml  ? ? ? ?Assessment/Plan:  71 y.o. male is s/p incision and debridement of all incisions of right leg with placement of wound VAC in the right groin 2 Days Post-Op  ? ?Increase klonopin to BID prn for anxiety.  Nursing staff will change wet to dry to R thigh later today.  Intra-operative cultures pending, no growth to date.  Continue IV abx.  Plan is for return to OR tomorrow for vac change and further debridement.  NPO past midnight.  Consent ordered.  Appreciate TRH help with hyperglycemia. ? ? ?Dagoberto Ligas, PA-C ?[facility] ?[phone] ?05/04/2021 ?8:48 AM ? ? ? ?

## 2021-05-05 ENCOUNTER — Encounter (HOSPITAL_COMMUNITY): Payer: Self-pay | Admitting: Surgery

## 2021-05-05 ENCOUNTER — Encounter (HOSPITAL_COMMUNITY)
Admission: RE | Disposition: A | Discharge status: Home Health | Payer: Self-pay | Source: Ambulatory Visit | Attending: Surgery

## 2021-05-05 ENCOUNTER — Inpatient Hospital Stay (HOSPITAL_COMMUNITY): Payer: Medicare Other | Admitting: Anesthesiology

## 2021-05-05 ENCOUNTER — Other Ambulatory Visit: Payer: Self-pay

## 2021-05-05 ENCOUNTER — Encounter: Payer: Medicare Other | Admitting: Surgery

## 2021-05-05 DIAGNOSIS — I9789 Other postprocedural complications and disorders of the circulatory system, not elsewhere classified: Secondary | ICD-10-CM

## 2021-05-05 DIAGNOSIS — T8149XA Infection following a procedure, other surgical site, initial encounter: Secondary | ICD-10-CM

## 2021-05-05 DIAGNOSIS — E1151 Type 2 diabetes mellitus with diabetic peripheral angiopathy without gangrene: Secondary | ICD-10-CM

## 2021-05-05 DIAGNOSIS — Z794 Long term (current) use of insulin: Secondary | ICD-10-CM

## 2021-05-05 DIAGNOSIS — I10 Essential (primary) hypertension: Secondary | ICD-10-CM

## 2021-05-05 HISTORY — PX: APPLICATION OF WOUND VAC: SHX5189

## 2021-05-05 HISTORY — PX: GROIN DEBRIDEMENT: SHX5159

## 2021-05-05 LAB — BASIC METABOLIC PANEL
Anion gap: 10 (ref 5–15)
BUN: 12 mg/dL (ref 8–23)
CO2: 29 mmol/L (ref 22–32)
Calcium: 8.8 mg/dL — ABNORMAL LOW (ref 8.9–10.3)
Chloride: 98 mmol/L (ref 98–111)
Creatinine, Ser: 0.88 mg/dL (ref 0.61–1.24)
GFR, Estimated: >60 mL/min (ref >60)
Glucose, Bld: 263 mg/dL — ABNORMAL HIGH (ref 70–99)
Potassium: 3.6 mmol/L (ref 3.5–5.1)
Sodium: 137 mmol/L (ref 135–145)

## 2021-05-05 LAB — GLUCOSE, CAPILLARY
Glucose-Capillary: 296 mg/dL — ABNORMAL HIGH (ref 70–99)
Glucose-Capillary: 202 mg/dL — ABNORMAL HIGH (ref 70–99)
Glucose-Capillary: 224 mg/dL — ABNORMAL HIGH (ref 70–99)
Glucose-Capillary: 200 mg/dL — ABNORMAL HIGH (ref 70–99)
Glucose-Capillary: 201 mg/dL — ABNORMAL HIGH (ref 70–99)
Glucose-Capillary: 326 mg/dL — ABNORMAL HIGH (ref 70–99)
Glucose-Capillary: 274 mg/dL — ABNORMAL HIGH (ref 70–99)

## 2021-05-05 LAB — CBC
HCT: 34.5 % — ABNORMAL LOW (ref 39.0–52.0)
Hemoglobin: 10.8 g/dL — ABNORMAL LOW (ref 13.0–17.0)
MCH: 32.4 pg (ref 26.0–34.0)
MCHC: 31.3 g/dL (ref 30.0–36.0)
MCV: 103.6 fL — ABNORMAL HIGH (ref 80.0–100.0)
Platelets: 383 10*3/uL (ref 150–400)
RBC: 3.33 MIL/uL — ABNORMAL LOW (ref 4.22–5.81)
RDW: 12.9 % (ref 11.5–15.5)
WBC: 8.4 10*3/uL (ref 4.0–10.5)
nRBC: 0 % (ref 0.0–0.2)

## 2021-05-05 SURGERY — APPLICATION, WOUND VAC
Anesthesia: General | Site: Leg Upper | Laterality: Right

## 2021-05-05 MED ORDER — HYDROMORPHONE HCL 1 MG/ML IJ SOLN
INTRAMUSCULAR | Status: AC
  Filled 2021-05-05: qty 1

## 2021-05-05 MED ORDER — ADULT MULTIVITAMIN W/MINERALS CH
1.0000 | ORAL_TABLET | Freq: Every day | ORAL | Status: DC
  Administered 2021-05-05 – 2021-05-07 (×3): 1 via ORAL
  Filled 2021-05-05 (×3): qty 1

## 2021-05-05 MED ORDER — DEXAMETHASONE SODIUM PHOSPHATE 10 MG/ML IJ SOLN
INTRAMUSCULAR | Status: DC | PRN
  Administered 2021-05-05: 5 mg via INTRAVENOUS

## 2021-05-05 MED ORDER — JUVEN PO PACK
1.0000 | PACK | Freq: Two times a day (BID) | ORAL | Status: DC
  Administered 2021-05-06 – 2021-05-07 (×3): 1 via ORAL
  Filled 2021-05-05 (×3): qty 1

## 2021-05-05 MED ORDER — ONDANSETRON HCL 4 MG/2ML IJ SOLN
INTRAMUSCULAR | Status: AC
  Filled 2021-05-05: qty 2

## 2021-05-05 MED ORDER — HYDROMORPHONE HCL 1 MG/ML IJ SOLN
0.2500 mg | INTRAMUSCULAR | Status: DC | PRN
  Administered 2021-05-05 (×2): 0.5 mg via INTRAVENOUS

## 2021-05-05 MED ORDER — PROPOFOL 10 MG/ML IV BOLUS
INTRAVENOUS | Status: DC | PRN
  Administered 2021-05-05: 150 mg via INTRAVENOUS

## 2021-05-05 MED ORDER — INSULIN ASPART 100 UNIT/ML IJ SOLN
0.0000 [IU] | INTRAMUSCULAR | Status: AC | PRN
  Administered 2021-05-05 (×2): 2 [IU] via SUBCUTANEOUS

## 2021-05-05 MED ORDER — MIDAZOLAM HCL 2 MG/2ML IJ SOLN
INTRAMUSCULAR | Status: AC
  Administered 2021-05-05: 1 mg via INTRAVENOUS
  Filled 2021-05-05: qty 2

## 2021-05-05 MED ORDER — FENTANYL CITRATE (PF) 250 MCG/5ML IJ SOLN
INTRAMUSCULAR | Status: AC
  Filled 2021-05-05: qty 5

## 2021-05-05 MED ORDER — PROPOFOL 10 MG/ML IV BOLUS
INTRAVENOUS | Status: AC
  Filled 2021-05-05: qty 20

## 2021-05-05 MED ORDER — MIDAZOLAM HCL 2 MG/2ML IJ SOLN
2.0000 mg | Freq: Once | INTRAMUSCULAR | Status: AC
  Filled 2021-05-05: qty 2

## 2021-05-05 MED ORDER — LACTATED RINGERS IV SOLN: INTRAVENOUS | Status: DC

## 2021-05-05 MED ORDER — PHENYLEPHRINE HCL-NACL 20-0.9 MG/250ML-% IV SOLN
INTRAVENOUS | Status: DC | PRN
  Administered 2021-05-05: 50 ug/min via INTRAVENOUS

## 2021-05-05 MED ORDER — ONDANSETRON HCL 4 MG/2ML IJ SOLN
INTRAMUSCULAR | Status: DC | PRN
  Administered 2021-05-05: 4 mg via INTRAVENOUS

## 2021-05-05 MED ORDER — LIDOCAINE 2% (20 MG/ML) 5 ML SYRINGE
INTRAMUSCULAR | Status: DC | PRN
  Administered 2021-05-05: 60 mg via INTRAVENOUS

## 2021-05-05 MED ORDER — 0.9 % SODIUM CHLORIDE (POUR BTL) OPTIME
TOPICAL | Status: DC | PRN
  Administered 2021-05-05 (×2): 1000 mL

## 2021-05-05 MED ORDER — METRONIDAZOLE 500 MG PO TABS
500.0000 mg | ORAL_TABLET | Freq: Two times a day (BID) | ORAL | Status: DC
  Administered 2021-05-05 – 2021-05-07 (×5): 500 mg via ORAL
  Filled 2021-05-05 (×5): qty 1

## 2021-05-05 MED ORDER — ENSURE MAX PROTEIN PO LIQD
11.0000 [oz_av] | Freq: Every day | ORAL | Status: DC
  Administered 2021-05-05 – 2021-05-07 (×3): 11 [oz_av] via ORAL
  Filled 2021-05-05 (×3): qty 330

## 2021-05-05 MED ORDER — INSULIN ASPART 100 UNIT/ML IJ SOLN
INTRAMUSCULAR | Status: AC
  Filled 2021-05-05: qty 1

## 2021-05-05 MED ORDER — INSULIN GLARGINE-YFGN 100 UNIT/ML ~~LOC~~ SOLN
22.0000 [IU] | Freq: Every day | SUBCUTANEOUS | Status: DC
  Administered 2021-05-05: 22 [IU] via SUBCUTANEOUS
  Filled 2021-05-05 (×2): qty 0.22

## 2021-05-05 MED ORDER — FENTANYL CITRATE (PF) 250 MCG/5ML IJ SOLN
INTRAMUSCULAR | Status: DC | PRN
  Administered 2021-05-05: 25 ug via INTRAVENOUS
  Administered 2021-05-05: 100 ug via INTRAVENOUS

## 2021-05-05 MED ORDER — CHLORHEXIDINE GLUCONATE 0.12 % MT SOLN
15.0000 mL | Freq: Once | OROMUCOSAL | Status: AC
  Administered 2021-05-05: 15 mL via OROMUCOSAL

## 2021-05-05 MED ORDER — LIDOCAINE 2% (20 MG/ML) 5 ML SYRINGE
INTRAMUSCULAR | Status: AC
  Filled 2021-05-05: qty 5

## 2021-05-05 MED ORDER — ORAL CARE MOUTH RINSE: 15.0000 mL | Freq: Once | OROMUCOSAL | Status: AC

## 2021-05-05 SURGICAL SUPPLY — 29 items
BNDG CMPR MED 10X6 ELC LF (GAUZE/BANDAGES/DRESSINGS) ×1
BNDG ELASTIC 4X5.8 VLCR STR LF (GAUZE/BANDAGES/DRESSINGS) ×2 IMPLANT
BNDG ELASTIC 6X10 VLCR STRL LF (GAUZE/BANDAGES/DRESSINGS) ×2 IMPLANT
CANISTER SUCT 3000ML PPV (MISCELLANEOUS) ×3 IMPLANT
CANISTER WOUNDNEG PRESSURE 500 (CANNISTER) ×2 IMPLANT
CONNECTOR Y WND VAC (MISCELLANEOUS) IMPLANT
COVER SURGICAL LIGHT HANDLE (MISCELLANEOUS) ×3 IMPLANT
DRAPE DERMATAC (DRAPES) ×4 IMPLANT
DRAPE INCISE IOBAN 66X45 STRL (DRAPES) ×5 IMPLANT
DRAPE ORTHO SPLIT 77X108 STRL (DRAPES) ×6
DRAPE SURG ORHT 6 SPLT 77X108 (DRAPES) ×1 IMPLANT
DRSG VAC ATS MED SENSATRAC (GAUZE/BANDAGES/DRESSINGS) ×2 IMPLANT
DRSG VAC ATS SM SENSATRAC (GAUZE/BANDAGES/DRESSINGS) ×4 IMPLANT
ELECT REM PT RETURN 9FT ADLT (ELECTROSURGICAL) ×3
ELECTRODE REM PT RTRN 9FT ADLT (ELECTROSURGICAL) ×1 IMPLANT
GLOVE SRG 8 PF TXTR STRL LF DI (GLOVE) ×1 IMPLANT
GLOVE SURG ENC MOIS LTX SZ7.5 (GLOVE) ×3 IMPLANT
GLOVE SURG UNDER POLY LF SZ8 (GLOVE) ×3
GOWN STRL REUS W/ TWL LRG LVL3 (GOWN DISPOSABLE) ×2 IMPLANT
GOWN STRL REUS W/ TWL XL LVL3 (GOWN DISPOSABLE) ×2 IMPLANT
GOWN STRL REUS W/TWL LRG LVL3 (GOWN DISPOSABLE) ×6
GOWN STRL REUS W/TWL XL LVL3 (GOWN DISPOSABLE) ×6
KIT BASIN OR (CUSTOM PROCEDURE TRAY) ×3 IMPLANT
KIT TURNOVER KIT B (KITS) ×3 IMPLANT
PACK GENERAL/GYN (CUSTOM PROCEDURE TRAY) ×3 IMPLANT
PAD ARMBOARD 7.5X6 YLW CONV (MISCELLANEOUS) ×6 IMPLANT
TOWEL GREEN STERILE (TOWEL DISPOSABLE) ×3 IMPLANT
WATER STERILE IRR 1000ML POUR (IV SOLUTION) IMPLANT
WND VAC CONN Y (MISCELLANEOUS) ×4

## 2021-05-05 NOTE — Anesthesia Procedure Notes (Signed)
Procedure Name: Intubation ?Date/Time: 05/05/2021 10:54 AM ?Performed by: Kyung Rudd, CRNA ?Pre-anesthesia Checklist: Patient identified, Emergency Drugs available, Suction available and Patient being monitored ?Patient Re-evaluated:Patient Re-evaluated prior to induction ?Oxygen Delivery Method: Circle system utilized ?Preoxygenation: Pre-oxygenation with 100% oxygen ?Induction Type: IV induction ?LMA: LMA inserted ?LMA Size: 4.0 ?Number of attempts: 1 ?Placement Confirmation: positive ETCO2 and breath sounds checked- equal and bilateral ?Tube secured with: Tape ?Dental Injury: Teeth and Oropharynx as per pre-operative assessment  ? ? ? ? ?

## 2021-05-05 NOTE — Progress Notes (Signed)
Patient returned to 4E from PACU. New wound vac in place. Patient alert and oriented to room. Vitals taken and stable. Tele placed and CCMD notified. Call bell within reach.  ?Michael [name]  ?

## 2021-05-05 NOTE — Op Note (Signed)
DATE OF SERVICE: 05/05/2021 ? ?PATIENT:  Michael [name]  71 y.o. male ? ?PRE-OPERATIVE DIAGNOSIS:  infection of right lower extremity femoral popliteal bypass with saphenous vein ? ?POST-OPERATIVE DIAGNOSIS:  Same ? ?PROCEDURE:   ?Right leg wounds irrigation and mechanical debridement  ?Application of negative pressure system to wounds of right leg ? ?SURGEON:  Surgeon(s) and Role: ?   * Cherre Robins, MD - Primary ? ?ASSISTANT: none ? ?ANESTHESIA:   general ? ?EBL: minimal ? ?BLOOD ADMINISTERED:none ? ?DRAINS: none  ? ?LOCAL MEDICATIONS USED:  NONE ? ?SPECIMEN:  none ? ?COUNTS: confirmed correct. ? ?TOURNIQUET:  none ? ?PATIENT DISPOSITION:  PACU - hemodynamically stable. ?  ?Delay start of Pharmacological VTE agent (>24hrs) due to surgical blood loss or risk of bleeding: no ? ?INDICATION FOR PROCEDURE: Michael [name] is a 71 y.o. male with open right lower extremity wounds after femoral popliteal bypass surgical site infection. After careful discussion of risks, benefits, and alternatives the patient was offered debridement and wound VAC therapy. The patient understood and wished to proceed. ? ?OPERATIVE FINDINGS: Mild amount of necrotic fat in the surgical beds.  This was debrided with a curette.  Healthy bleeding tissue was encountered.  A VAC was applied to all 3 wound beds. ? ?DESCRIPTION OF PROCEDURE: After identification of the patient in the pre-operative holding area, the patient was transferred to the operating room. The patient was positioned supine on the operating room table. Anesthesia was induced. The right lower extremity was prepped and draped in standard fashion. A surgical pause was performed confirming correct patient, procedure, and operative location. ? ?The wounds of the right lower extremity were copiously irrigated with 2 L of saline.  All nonviable tissue was debrided mechanically with a curette.  Healthy bleeding tissue was encountered.  The leg was washed.  A negative pressure wound  therapy system was brought onto the field and applied to the wounds.  Good seal and suction were noted prior to leaving the operating room.  The leg was wrapped from toes to thigh with Ace wrap. ? ?Upon completion of the case instrument and sharps counts were confirmed correct. The patient was transferred to the PACU in good condition. I was present for all portions of the procedure. ? ?Yevonne Aline. Stanford Breed, MD ?[facility] [place] ?Office Phone Number: [phone] ?05/05/2021 12:19 PM ? ? ? ?

## 2021-05-05 NOTE — Progress Notes (Signed)
Inpatient Diabetes Program Recommendations ? ?AACE/ADA: New Consensus Statement on Inpatient Glycemic Control (2015) ? ?Target Ranges:  Prepandial:   less than 140 mg/dL ?     Peak postprandial:   less than 180 mg/dL (1-2 hours) ?     Critically ill patients:  140 - 180 mg/dL  ? ?Lab Results  ?Component Value Date  ? GLUCAP 202 (H) 05/05/2021  ? HGBA1C 8.0 (H) 04/16/2021  ? ? ?Review of Glycemic Control ? Latest Reference Range & Units 05/05/21 06:23 05/05/21 08:30 05/05/21 09:23  ?Glucose-Capillary 70 - 99 mg/dL 296 (H) 224 (H) 202 (H)  ?(H): Data is abnormally high ?Blood sugars still above goal of 140-180 mg/dL. ?Need tight glycemic control for healing. ?HgbA1C - 8.0% ?  ?Current orders for Inpatient glycemic control: Semglee 16 units QHS, Novolog 0-15 units TID + 10 units TID + Novolog 0-5 units QHS ?  ?Inpatient Diabetes Program Recommendations:   ?  ?Consider increasing Semglee to 22 units QHS ?  ?Will follow glucose trends. ? ?Thanks, ?[name] Curb, MSN, RNC-OB ?Diabetes Coordinator ?[phone] (8a-5p) ? ? ?

## 2021-05-05 NOTE — Progress Notes (Signed)
Initial Nutrition Assessment ? ?DOCUMENTATION CODES:  ? ?Not applicable ? ?INTERVENTION:  ?-1 packet Juven BID, each packet provides 95 calories, 2.5 grams of protein (collagen), and 9.8 grams of carbohydrate (3 grams sugar); also contains 7 grams of L-arginine and L-glutamine, 300 mg vitamin C, 15 mg vitamin E, 1.2 mcg vitamin B-12, 9.5 mg zinc, 200 mg calcium, and 1.5 g  Calcium Beta-hydroxy-Beta-methylbutyrate to support wound healing ? ?- Ensure Max po BID, each supplement provides 150 kcal and 30 grams of protein. ? ?- MVI with minerals daily  ? ?- Provided diet education handouts  ? ?- Encourage adequate PO intake and protein for wound healing  ? ? ?NUTRITION DIAGNOSIS:  ? ?Increased nutrient needs related to post-op healing, wound healing (infected surgical wound to right groin) as evidenced by estimated needs. ? ? ?GOAL:  ? ?Patient will meet greater than or equal to 90% of their needs ? ? ?MONITOR:  ? ?PO intake, Supplement acceptance, Labs, Weight trends ? ?REASON FOR ASSESSMENT:  ? ?Consult ?Diet education, Assessment of nutrition requirement/status ? ?ASSESSMENT:  ? ?Pt is a 71 year old male with medical history significant of diabetes mellitus, HTN, HLD, GERD, PAD, h/o prostate CA, right leg claudication with failed percutaneous intervention. Pt underwent right femoral to above-knee popliteal artery bypass graft with saphenous vein on 04/16/2021 and has developed drainage and redness from his medial thigh. ? ?05/02/21 - I&D with wound VAC  ?05/05/21 - I&D of all incisions of right leg with wound VAC placement in right groin  ? ?Consult for further education of what pt is eating to get better control of his diabetes.  ? ?Met with pt at bedside. Per pt, pt endorses a good appetite today and reports that he ate 100% of his broccoli, boiled cabbage, carrots, brussels sprouts, salmon, with water for lunch today. Prior to admission, pt reports that he has not been eating well but has been trying to eat more  consciously since his surgery two weeks ago. Pt reports that he typically has oat-grain, boiled egg, toast and english muffin with cream cheese and coffee for breakfast. For lunch, pt reports that he picks up an assorted cheese packet. He also reports that at his job, cake is usually brought in and that he eats a slice. Pt reports that he typically does not eat supper and that he typically binge eats and likes candy bars such as Snickers. Pt also reports that sometimes for dinner he has diced spam with some sugar on top of it. Pt reports that he is consciously trying to make better food choices to aid in blood sugar management due to uncontrolled diabetes. He reports that he lives with his wife and that they eat out a lot but is trying to do a better job with cooking more at home. Pt denies any recent significant weight changes.  ? ?Discussed with pt the importance of good nutrition and protein intake for wound healing. Provided handouts from the Academy of Nutrition and Dietetics, "Carbohydrate Counting for Diabetes," and "Plate Method for Diabetes," to pt. Reviewed pt's dietary recall and discussed ways to modify pt's current intake. Encouraged pt to consume consistent meals throughout the day. Discussed oral nutrition supplementation to aid in caloric and protein intake and to promote wound healing and pt agreeable to trying Ensure and Juven. Pt also agreeable to MVI supplementation.  ? ?Current wt: 105.9 kg  ?Weight history reviewed. Pt's weight appears stable.  ? ?Medications reviewed and include: Novolog, Semglee, protonix, hydrodiuril,  maxipime, vancocin ? ?Labs reviewed and include:  ?CBGs: 137 - 201 x 24 hours ?A1C: 8.0  ?Glucose: 263  ? ? ?NUTRITION - FOCUSED PHYSICAL EXAM: ? ?Flowsheet Row Most Recent Value  ?Orbital Region No depletion  ?Upper Arm Region Mild depletion  ?Thoracic and Lumbar Region No depletion  ?Buccal Region No depletion  ?Temple Region No depletion  ?Clavicle Bone Region No depletion   ?Clavicle and Acromion Bone Region No depletion  ?Scapular Bone Region No depletion  ?Dorsal Hand No depletion  ?Patellar Region No depletion  ?Anterior Thigh Region No depletion  ?Posterior Calf Region Mild depletion  ?Edema (RD Assessment) None  ?Hair Reviewed  ?Eyes Reviewed  ?Mouth Reviewed  ?Skin Reviewed  ?Nails Reviewed  ? ?  ? ? ?Diet Order:   ?Diet Order   ? ?       ?  Diet Carb Modified Fluid consistency: Thin; Room service appropriate? Yes  Diet effective now       ?  ? ?  ?  ? ?  ? ? ?EDUCATION NEEDS:  ? ?Education needs have been addressed ? ?Skin:  Skin Assessment: Skin Integrity Issues: ?Skin Integrity Issues:: Incisions ?Incisions: (closed) - right leg, right groin ? ?Last BM:  3/3 ? ?Height:  ? ?Ht Readings from Last 1 Encounters:  ?04/28/21 6' (1.829 m)  ? ? ?Weight:  ? ?Wt Readings from Last 1 Encounters:  ?05/05/21 105.9 kg  ? ? ?Ideal Body Weight:  80.9 kg ? ?BMI:  Body mass index is 31.66 kg/m?. ? ?Estimated Nutritional Needs:  ? ?Kcal:  2200 - 2400 ? ?Protein:  110 - 125 grams ? ?Fluid:  >/= 2.2 L ? ? ? ?Maryruth Hancock, Dietetic Intern ?05/05/2021 4:28 PM ?

## 2021-05-05 NOTE — Anesthesia Postprocedure Evaluation (Signed)
Anesthesia Post Note ? ?Patient: ILAY CAPSHAW ? ?Procedure(s) Performed: WOUND VAC CHANGE RIGHT GROIN, APPLICATION OF WOUND VAC PROXIMAL AND DISTAL MEDIAL RIGHT THIGH (Right: Leg Upper) ?IRRIGATION AND DEBRIDEMENT RIGHT GROIN AND RIGHT THIGH (Right: Leg Upper) ? ?  ? ?Patient location during evaluation: PACU ?Anesthesia Type: General ?Level of consciousness: awake and alert ?Pain management: pain level controlled ?Vital Signs Assessment: post-procedure vital signs reviewed and stable ?Respiratory status: spontaneous breathing, nonlabored ventilation and respiratory function stable ?Cardiovascular status: blood pressure returned to baseline and stable ?Postop Assessment: no apparent nausea or vomiting ?Anesthetic complications: no ? ? ?No notable events documented. ? ?Last Vitals:  ?Vitals:  ? 05/05/21 1245 05/05/21 1304  ?BP: 140/73 (!) 149/76  ?Pulse: 74 76  ?Resp: 14 13  ?Temp: (!) 36.2 ?C 36.5 ?C  ?SpO2: 96% 97%  ?  ?Last Pain:  ?Vitals:  ? 05/05/21 1304  ?TempSrc: Oral  ?PainSc:   ? ? ?  ?  ?  ?  ?  ?  ? ?Monai Hindes,W. EDMOND ? ? ? ? ?

## 2021-05-05 NOTE — Progress Notes (Signed)
[facility] [place] ?PROGRESS NOTE ? ?ASSESSMENT / PLAN: ?Michael [name] is a 71 y.o. male is s/p incision and debridement of all incisions of right leg with placement of wound VAC in the right groin. Plan return to OR for washout and VAC exchange today.  ? ?SUBJECTIVE: ?No complaints.  ? ?OBJECTIVE: ?BP 130/65   Pulse 79   Temp 98.2 ?F (36.8 ?C) (Oral)   Resp 16   Wt 105.9 kg   SpO2 98%   BMI 31.66 kg/m?  ? ?Intake/Output Summary (Last 24 hours) at 05/05/2021 1013 ?Last data filed at 05/05/2021 0745 ?Gross per 24 hour  ?Intake --  ?Output 910 ml  ?Net -910 ml  ?  ?No distress ?VAC and bandages clean ? ?CBC Latest Ref Rng & Units 05/05/2021 05/04/2021 05/03/2021  ?WBC 4.0 - 10.5 K/uL 8.4 7.6 9.7  ?Hemoglobin 13.0 - 17.0 g/dL 10.8(L) 9.8(L) 11.4(L)  ?Hematocrit 39.0 - 52.0 % 34.5(L) 30.4(L) 33.6(L)  ?Platelets 150 - 400 K/uL 383 321 345  ?  ? ?CMP Latest Ref Rng & Units 05/05/2021 05/04/2021 05/03/2021  ?Glucose 70 - 99 mg/dL 263(H) 232(H) 302(H)  ?BUN 8 - 23 mg/dL '12 18 19  '$ ?Creatinine 0.61 - 1.24 mg/dL 0.88 0.93 0.81  ?Sodium 135 - 145 mmol/L 137 134(L) 133(L)  ?Potassium 3.5 - 5.1 mmol/L 3.6 3.6 5.0  ?Chloride 98 - 111 mmol/L 98 97(L) 97(L)  ?CO2 22 - 32 mmol/L '29 28 25  '$ ?Calcium 8.9 - 10.3 mg/dL 8.8(L) 8.4(L) 8.6(L)  ?Total Protein 6.5 - 8.1 g/dL - - -  ?Total Bilirubin 0.3 - 1.2 mg/dL - - -  ?Alkaline Phos 38 - 126 U/L - - -  ?AST 15 - 41 U/L - - -  ?ALT 0 - 44 U/L - - -  ? ? ?Estimated Creatinine Clearance: 95.4 mL/min (by C-G formula based on SCr of 0.88 mg/dL). ? ?Yevonne Aline. Stanford Breed, MD ?[facility] ?Office Phone Number: [phone] ?05/05/2021 10:13 AM ? ? ? ?

## 2021-05-05 NOTE — Transfer of Care (Signed)
Immediate Anesthesia Transfer of Care Note ? ?Patient: Michael [name] ? ?Procedure(s) Performed: WOUND VAC CHANGE RIGHT GROIN, APPLICATION OF WOUND VAC PROXIMAL AND DISTAL MEDIAL RIGHT THIGH (Right: Leg Upper) ?IRRIGATION AND DEBRIDEMENT RIGHT GROIN AND RIGHT THIGH (Right: Leg Upper) ? ?Patient Location: PACU ? ?Anesthesia Type:General ? ?Level of Consciousness: awake, alert  and oriented ? ?Airway & Oxygen Therapy: Patient Spontanous Breathing ? ?Post-op Assessment: Report given to RN and Post -op Vital signs reviewed and stable ? ?Post vital signs: Reviewed and stable ? ?Last Vitals:  ?Vitals Value Taken Time  ?BP 132/87 05/05/21 1144  ?Temp    ?Pulse 77 05/05/21 1147  ?Resp 13 05/05/21 1147  ?SpO2 95 % 05/05/21 1147  ?Vitals shown include unvalidated device data. ? ?Last Pain:  ?Vitals:  ? 05/05/21 0930  ?TempSrc: Oral  ?PainSc: 5   ?   ? ?Patients Stated Pain Goal: 0 (05/05/21 0537) ? ?Complications: No notable events documented. ?

## 2021-05-05 NOTE — Progress Notes (Signed)
PROGRESS NOTE    Michael [name]  KAJ:[id number] DOB: Jul 15, 1950 DOA: 04/28/2021 PCP: Carolee Rota, NP    Brief Narrative:   71 y.o. male is s/p incision and debridement of all incisions of right leg with placement of wound VAC in the right groin.  TRH consulted for diabetes management  Subjective: Went to OR today. No sob, cp, dizziness  Objective: Vitals:   05/05/21 1230 05/05/21 1245 05/05/21 1304 05/05/21 1523  BP: (!) 149/74 140/73 (!) 149/76 (!) 147/74  Pulse: 77 74 76 83  Resp: '13 14 13 20  '$ Temp:  (!) 97.1 F (36.2 C) 97.7 F (36.5 C) 97.8 F (36.6 C)  TempSrc:   Oral Oral  SpO2: 99% 96% 97% 98%  Weight:        Intake/Output Summary (Last 24 hours) at 05/05/2021 1549 Last data filed at 05/05/2021 1145 Gross per 24 hour  Intake 550 ml  Output 770 ml  Net -220 ml   Filed Weights   05/04/21 0500 05/05/21 0550  Weight: 107.7 kg 105.9 kg    Examination:  Calm, NAD Cta no w/r Reg s1/s2 no gallop Soft benign +bs Right lower extremity wrapped and dressing/Ace band, wound VAC draining bloody fluid Aaoxox3  Mood and affect appropriate in current setting     Data Reviewed: I have personally reviewed following labs and imaging studies  CBC: Recent Labs  Lab 05/01/21 0145 05/02/21 0153 05/03/21 0204 05/04/21 0315 05/05/21 0300  WBC 6.5 6.9 9.7 7.6 8.4  HGB 11.2* 11.3* 11.4* 9.8* 10.8*  HCT 33.4* 34.7* 33.6* 30.4* 34.5*  MCV 100.9* 101.5* 99.7 103.1* 103.6*  PLT 285 335 345 321 035   Basic Metabolic Panel: Recent Labs  Lab 05/01/21 0145 05/02/21 0153 05/03/21 0204 05/04/21 0315 05/05/21 0300  NA 135 135 133* 134* 137  K 3.9 3.3* 5.0 3.6 3.6  CL 101 100 97* 97* 98  CO2 '22 26 25 28 29  '$ GLUCOSE 261* 186* 302* 232* 263*  BUN '17 15 19 18 12  '$ CREATININE 0.82 0.98 0.81 0.93 0.88  CALCIUM 8.6* 8.6* 8.6* 8.4* 8.8*   GFR: Estimated Creatinine Clearance: 95.4 mL/min (by C-G formula based on SCr of 0.88 mg/dL). Liver Function Tests: No results for  input(s): AST, ALT, ALKPHOS, BILITOT, PROT, ALBUMIN in the last 168 hours. No results for input(s): LIPASE, AMYLASE in the last 168 hours. No results for input(s): AMMONIA in the last 168 hours. Coagulation Profile: No results for input(s): INR, PROTIME in the last 168 hours. Cardiac Enzymes: No results for input(s): CKTOTAL, CKMB, CKMBINDEX, TROPONINI in the last 168 hours. BNP (last 3 results) No results for input(s): PROBNP in the last 8760 hours. HbA1C: No results for input(s): HGBA1C in the last 72 hours. CBG: Recent Labs  Lab 05/05/21 0623 05/05/21 0830 05/05/21 0923 05/05/21 1146 05/05/21 1331  GLUCAP 296* 224* 202* 200* 201*   Lipid Profile: No results for input(s): CHOL, HDL, LDLCALC, TRIG, CHOLHDL, LDLDIRECT in the last 72 hours. Thyroid Function Tests: No results for input(s): TSH, T4TOTAL, FREET4, T3FREE, THYROIDAB in the last 72 hours. Anemia Panel: No results for input(s): VITAMINB12, FOLATE, FERRITIN, TIBC, IRON, RETICCTPCT in the last 72 hours. Sepsis Labs: No results for input(s): PROCALCITON, LATICACIDVEN in the last 168 hours.  Recent Results (from the past 240 hour(s))  Resp Panel by RT-PCR (Flu A&B, Covid) Nasopharyngeal Swab     Status: None   Collection Time: 04/28/21  6:40 PM   Specimen: Nasopharyngeal Swab; Nasopharyngeal(NP) swabs in vial transport  medium  Result Value Ref Range Status   SARS Coronavirus 2 by RT PCR NEGATIVE NEGATIVE Final    Comment: (NOTE) SARS-CoV-2 target nucleic acids are NOT DETECTED.  The SARS-CoV-2 RNA is generally detectable in upper respiratory specimens during the acute phase of infection. The lowest concentration of SARS-CoV-2 viral copies this assay can detect is 138 copies/mL. A negative result does not preclude SARS-Cov-2 infection and should not be used as the sole basis for treatment or other patient management decisions. A negative result may occur with  improper specimen collection/handling, submission of  specimen other than nasopharyngeal swab, presence of viral mutation(s) within the areas targeted by this assay, and inadequate number of viral copies(<138 copies/mL). A negative result must be combined with clinical observations, patient history, and epidemiological information. The expected result is Negative.  Fact Sheet for Patients:  EntrepreneurPulse.com.au  Fact Sheet for Healthcare Providers:  IncredibleEmployment.be  This test is no t yet approved or cleared by the [place] FDA and  has been authorized for detection and/or diagnosis of SARS-CoV-2 by FDA under an Emergency Use Authorization (EUA). This EUA will remain  in effect (meaning this test can be used) for the duration of the COVID-19 declaration under Section 564(b)(1) of the Act, 21 U.S.C.section 360bbb-3(b)(1), unless the authorization is terminated  or revoked sooner.       Influenza A by PCR NEGATIVE NEGATIVE Final   Influenza B by PCR NEGATIVE NEGATIVE Final    Comment: (NOTE) The Xpert Xpress SARS-CoV-2/FLU/RSV plus assay is intended as an aid in the diagnosis of influenza from Nasopharyngeal swab specimens and should not be used as a sole basis for treatment. Nasal washings and aspirates are unacceptable for Xpert Xpress SARS-CoV-2/FLU/RSV testing.  Fact Sheet for Patients: EntrepreneurPulse.com.au  Fact Sheet for Healthcare Providers: IncredibleEmployment.be  This test is not yet approved or cleared by the [place] FDA and has been authorized for detection and/or diagnosis of SARS-CoV-2 by FDA under an Emergency Use Authorization (EUA). This EUA will remain in effect (meaning this test can be used) for the duration of the COVID-19 declaration under Section 564(b)(1) of the Act, 21 U.S.C. section 360bbb-3(b)(1), unless the authorization is terminated or revoked.  Performed at [facility], [place] [address],  [place], [place] [address]   Aerobic/Anaerobic Culture w Gram Stain (surgical/deep wound)     Status: None (Preliminary result)   Collection Time: 05/02/21  1:00 PM   Specimen: Wound  Result Value Ref Range Status   Specimen Description WOUND  Final   Special Requests RIGHT LEG WOUND SPEC A  Final   Gram Stain   Final    FEW WBC PRESENT, PREDOMINANTLY MONONUCLEAR NO ORGANISMS SEEN    Culture   Final    HOLDING FOR POSSIBLE ANAEROBE Performed at [facility], [place] [address], [place], [place] [address]    Report Status PENDING  Incomplete         Radiology Studies: No results found.      Scheduled Meds:  amLODipine  10 mg Oral Daily   atorvastatin  40 mg Oral QHS   benazepril  40 mg Oral Daily   clopidogrel  75 mg Oral Daily   heparin  5,000 Units Subcutaneous Q8H   hydrochlorothiazide  25 mg Oral Daily   HYDROmorphone       insulin aspart       insulin aspart  0-15 Units Subcutaneous TID WC   insulin aspart  0-5 Units Subcutaneous QHS  insulin aspart  10 Units Subcutaneous TID WC   insulin glargine-yfgn  22 Units Subcutaneous QHS   metoprolol succinate  100 mg Oral Daily   metroNIDAZOLE  500 mg Oral Q12H   pantoprazole  40 mg Oral Daily   tamsulosin  0.4 mg Oral QHS   Continuous Infusions:  ceFEPime (MAXIPIME) IV 2 g (05/05/21 1511)   vancomycin 1,000 mg (05/04/21 2221)    Assessment & Plan:   Principal Problem:   Infected surgical wound Active Problems:   Diabetes mellitus, type 2 ([place])   Infected surgical wound Patient presented on 2/27 and was noted to have an infected wound of previous popliteal artery bypass graft with saphenous vein placed on 2/15.  Started on empiric antibiotics vancomycin and cefepime.  CT scan of the right leg on 3/2 noted subcutaneous gas tracking to the femoral vascular sheath.  Vascular to patient for excision and debridement of the right leg and placement of VAC dressing. 3/6 wound cultures pending Wound VAC changed  today Management per primary team Continue IV cefepime and vancomycin     Diabetes mellitus, type 2 ([facility]) Uncontrolled.  Last Hemoglobin A1c 8 on 2/15.   3/6 BG little on the high side Semglee changed to 22 units Continue 10 units NovoLog with meals Continue to monitor    DVT prophylaxis: Heparin                LOS: 7 days   Time spent: 35 min     Nolberto Hanlon, MD [facility] Pager [phone]  If 7PM-7AM, please contact night-coverage 05/05/2021, 3:49 PM

## 2021-05-05 NOTE — Anesthesia Preprocedure Evaluation (Addendum)
Anesthesia Evaluation  ?Patient identified by MRN, date of birth, ID band ?Patient awake ? ? ? ?Reviewed: ?Allergy & Precautions, H&P , NPO status , Patient's Chart, lab work & pertinent test results ? ?Airway ?Mallampati: II ? ?TM Distance: >3 FB ?Neck ROM: Full ? ? ? Dental ?no notable dental hx. ?(+) Teeth Intact, Dental Advisory Given ?  ?Pulmonary ?sleep apnea ,  ?  ?Pulmonary exam normal ?breath sounds clear to auscultation ? ? ? ? ? ? Cardiovascular ?hypertension, Pt. on medications ?+ Peripheral Vascular Disease  ? ?Rhythm:Regular Rate:Normal ? ? ?  ?Neuro/Psych ?Anxiety negative neurological ROS ?   ? GI/Hepatic ?Neg liver ROS, GERD  Medicated,  ?Endo/Other  ?diabetes, Insulin Dependent ? Renal/GU ?negative Renal ROS  ?negative genitourinary ?  ?Musculoskeletal ? ? Abdominal ?  ?Peds ? Hematology ?negative hematology ROS ?(+)   ?Anesthesia Other Findings ? ? Reproductive/Obstetrics ?negative OB ROS ? ?  ? ? ? ? ? ? ? ? ? ? ? ? ? ?  ?  ? ? ? ? ? ? ? ?Anesthesia Physical ?Anesthesia Plan ? ?ASA: 3 ? ?Anesthesia Plan: General  ? ?Post-op Pain Management: Tylenol PO (pre-op)*  ? ?Induction: Intravenous ? ?PONV Risk Score and Plan: 3 and Ondansetron, Dexamethasone and Treatment may vary due to age or medical condition ? ?Airway Management Planned: LMA ? ?Additional Equipment:  ? ?Intra-op Plan:  ? ?Post-operative Plan: Extubation in OR ? ?Informed Consent: I have reviewed the patients History and Physical, chart, labs and discussed the procedure including the risks, benefits and alternatives for the proposed anesthesia with the patient or authorized representative who has indicated his/her understanding and acceptance.  ? ? ? ?Dental advisory given ? ?Plan Discussed with: CRNA ? ?Anesthesia Plan Comments:   ? ? ? ? ? ? ?Anesthesia Quick Evaluation ? ?

## 2021-05-05 NOTE — Anesthesia Postprocedure Evaluation (Signed)
Anesthesia Post Note ? ?Patient: KONSTANTIN LEHNEN ? ?Procedure(s) Performed: IRRIGATION AND DEBRIDEMENT RIGHT LEG (Right: Leg Upper) ?APPLICATION OF WOUND VAC (Right: Leg Upper) ? ?  ? ?Patient location during evaluation: PACU ?Anesthesia Type: General ?Level of consciousness: awake and alert ?Pain management: pain level controlled ?Vital Signs Assessment: post-procedure vital signs reviewed and stable ?Respiratory status: spontaneous breathing, nonlabored ventilation and respiratory function stable ?Cardiovascular status: stable and blood pressure returned to baseline ?Anesthetic complications: no ? ? ?No notable events documented. ? ?Last Vitals:  ?Vitals:  ? 05/05/21 0810 05/05/21 0930  ?BP: 128/73 130/65  ?Pulse: 78 79  ?Resp: 17 16  ?Temp: (!) 36.4 ?C 36.8 ?C  ?SpO2: 95% 98%  ?  ?Last Pain:  ?Vitals:  ? 05/05/21 0930  ?TempSrc: Oral  ?PainSc: 5   ? ? ?  ?  ?  ?  ?  ?  ? ?Audry Pili ? ? ? ? ?

## 2021-05-06 ENCOUNTER — Encounter (HOSPITAL_COMMUNITY): Payer: Self-pay | Admitting: Vascular Surgery

## 2021-05-06 DIAGNOSIS — T8149XA Infection following a procedure, other surgical site, initial encounter: Secondary | ICD-10-CM | POA: Diagnosis not present

## 2021-05-06 LAB — CBC
HCT: 27.1 % — ABNORMAL LOW (ref 39.0–52.0)
Hemoglobin: 9 g/dL — ABNORMAL LOW (ref 13.0–17.0)
MCH: 34 pg (ref 26.0–34.0)
MCHC: 33.2 g/dL (ref 30.0–36.0)
MCV: 102.3 fL — ABNORMAL HIGH (ref 80.0–100.0)
Platelets: 317 10*3/uL (ref 150–400)
RBC: 2.65 MIL/uL — ABNORMAL LOW (ref 4.22–5.81)
RDW: 13 % (ref 11.5–15.5)
WBC: 9.1 10*3/uL (ref 4.0–10.5)
nRBC: 0 % (ref 0.0–0.2)

## 2021-05-06 LAB — AEROBIC/ANAEROBIC CULTURE W GRAM STAIN (SURGICAL/DEEP WOUND)

## 2021-05-06 LAB — BASIC METABOLIC PANEL
Anion gap: 5 (ref 5–15)
BUN: 16 mg/dL (ref 8–23)
CO2: 27 mmol/L (ref 22–32)
Calcium: 8.2 mg/dL — ABNORMAL LOW (ref 8.9–10.3)
Chloride: 100 mmol/L (ref 98–111)
Creatinine, Ser: 0.91 mg/dL (ref 0.61–1.24)
GFR, Estimated: >60 mL/min (ref >60)
Glucose, Bld: 260 mg/dL — ABNORMAL HIGH (ref 70–99)
Potassium: 4.4 mmol/L (ref 3.5–5.1)
Sodium: 132 mmol/L — ABNORMAL LOW (ref 135–145)

## 2021-05-06 LAB — VANCOMYCIN, TROUGH: Vancomycin Tr: 17 ug/mL (ref 15–20)

## 2021-05-06 LAB — GLUCOSE, CAPILLARY
Glucose-Capillary: 392 mg/dL — ABNORMAL HIGH (ref 70–99)
Glucose-Capillary: 256 mg/dL — ABNORMAL HIGH (ref 70–99)
Glucose-Capillary: 114 mg/dL — ABNORMAL HIGH (ref 70–99)
Glucose-Capillary: 210 mg/dL — ABNORMAL HIGH (ref 70–99)

## 2021-05-06 LAB — VANCOMYCIN, PEAK: Vancomycin Pk: 33 ug/mL (ref 30–40)

## 2021-05-06 MED ORDER — VANCOMYCIN HCL 750 MG/150ML IV SOLN
750.0000 mg | Freq: Two times a day (BID) | INTRAVENOUS | Status: DC
  Administered 2021-05-07 (×2): 750 mg via INTRAVENOUS
  Filled 2021-05-06 (×2): qty 150

## 2021-05-06 MED ORDER — INSULIN ASPART 100 UNIT/ML IJ SOLN
15.0000 [IU] | Freq: Three times a day (TID) | INTRAMUSCULAR | Status: DC
  Administered 2021-05-06 – 2021-05-07 (×3): 15 [IU] via SUBCUTANEOUS

## 2021-05-06 MED ORDER — INSULIN GLARGINE-YFGN 100 UNIT/ML ~~LOC~~ SOLN
27.0000 [IU] | Freq: Every day | SUBCUTANEOUS | Status: DC
  Administered 2021-05-06: 27 [IU] via SUBCUTANEOUS
  Filled 2021-05-06 (×2): qty 0.27

## 2021-05-06 NOTE — TOC Initial Note (Signed)
Transition of Care (TOC) - Initial/Assessment Note  ?Marvetta Gibbons Therapist, sports, [name] ?Transitions of Care ?Unit 4E- RN Case Manager ?See Treatment Team for direct phone #  ? ? ?Patient Details  ?Name: Michael [name] ?MRN: [id number] ?Date of Birth: 1950-05-09 ? ?Transition of Care (TOC) CM/SW Contact:    ?Dahlia Client, Romeo Rabon, RN ?Phone Number: ?05/06/2021, 3:53 PM ? ?Clinical Narrative:                 ?Referral received for [facility] and home wound VAC needs.  ?[employer] home VAC order form placed on chart for signature-  ?1660- form has been signed and faxed to Olivia Mackie with [facility]- insurance approval pending at this time. Once approved - home VAC to be delivered to room for transition home.  ? ?1500-CM spoke with pt and wife at bedside for [facility] needs. List provided for [facility] choice Per CMS guidelines from medicare.gov website with star ratings (copy placed in shadow chart), after review pt has selected [facility] for [facility] needs, if [facility] unable to accept then pt has no further preference and will defer to CM to secure needed services.  ? ?Address, phone #s and PCP all confirmed in epic with pt, Wife to transport home.  ?Discussed potential DME needs- pt reports he has DME available at home if needed- wife to check on [facility] to see if they need a new one- otherwise no DME needs at this time.  ? ?1520-Call made to [facility] with [place] referral has been accepted, HH will tentatively plan to see pt on Friday for home VAC drsg change. If pt discharges tomorrow- will need drsg changed here prior to discharge.  ? ?Wound Measurements requested, Updated Olivia Mackie at [place] still pending approval.  ? ? ?Expected Discharge Plan: [place] ?Barriers to Discharge: Continued Medical Work up ? ? ?Patient Goals and CMS Choice ?Patient states their goals for this hospitalization and ongoing recovery are:: return home w/ wife, get back to doing normal things ?CMS Medicare.gov Compare Post Acute Care list provided to::  Patient ?Choice offered to / list presented to : Patient, Spouse ? ?Expected Discharge Plan and Services ?Expected Discharge Plan: [place] ?  ?Discharge Planning Services: CM Consult ?Post Acute Care Choice: Durable Medical Equipment, Home Health ?Living arrangements for the past 2 months: [place] ?                ?DME Arranged: Vac ?DME Agency: [facility] ?Date DME Agency Contacted: 05/06/21 ?Time DME Agency Contacted: 1500 ?Representative spoke with at DME Agency: Olivia Mackie ?HH Arranged: RN ?[place] Agency: Microbiologist ([place]) ?Date HH Agency Contacted: 05/06/21 ?Time [place]: 6301 ?Representative spoke with at [place]: Corene Cornea ? ?Prior Living Arrangements/Services ?Living arrangements for the past 2 months: [place] ?Lives with:: Spouse ?Patient language and need for interpreter reviewed:: Yes ?Do you feel safe going back to the place where you live?: Yes      ?Need for Family Participation in Patient Care: Yes (Comment) ?Care giver support system in place?: Yes (comment) ?Current home services: DME ?Criminal Activity/Legal Involvement Pertinent to Current Situation/Hospitalization: No - Comment as needed ? ?Activities of Daily Living ?  ?  ? ?Permission Sought/Granted ?Permission sought to share information with : Customer service manager ?Permission granted to share information with : Yes, Verbal Permission Granted ?   ? Permission granted to share info w AGENCY: HH/DME ?   ?   ? ?Emotional  Assessment ?Appearance:: Appears stated age ?Attitude/Demeanor/Rapport: Engaged ?Affect (typically observed): Accepting, Appropriate, Pleasant ?Orientation: : Oriented to Self, Oriented to Place, Oriented to  Time, Oriented to Situation ?Alcohol / Substance Use: Not Applicable ?Psych Involvement: No (comment) ? ?Admission diagnosis:  Infected surgical wound [T81.49XA] ?Patient Active Problem List  ? Diagnosis Date Noted  ? Infected surgical wound 04/28/2021  ? PAD  (peripheral artery disease) ([place]) 04/16/2021  ? Malignant neoplasm of prostate ([place]) 03/29/2018  ? Diabetes mellitus, type 2 ([place]) 01/29/2015  ? Obesity (BMI 30-39.9) 01/29/2015  ? Essential hypertension, benign 01/13/2013  ? Peripheral vascular disease ([place]) 01/13/2013  ? Research study patient 06/19/2011  ? Atherosclerosis of native arteries of the extremities with intermittent claudication 11/24/2010  ? ?PCP:  Carolee Rota, NP ?Pharmacy:   ?[facility] ?[address] ?[place] [address] ?Phone: [phone] Fax: [phone] ? ? ? ? ?Social Determinants of Health (SDOH) Interventions ?  ? ?Readmission Risk Interventions ?Readmission Risk Prevention Plan 05/06/2021  ?Transportation Screening Complete  ?PCP or Specialist Appt within 5-7 Days Complete  ?Home Care Screening Complete  ?Medication Review (RN CM) Complete  ?Some recent data might be hidden  ? ? ? ?

## 2021-05-06 NOTE — Progress Notes (Signed)
PROGRESS NOTE    Michael [name]  LHT:[id number] DOB: [birth year] DOA: 04/28/2021 PCP: Carolee Rota, NP    Brief Narrative:   71 y.o. male is s/p incision and debridement of all incisions of right leg with placement of wound VAC in the right groin.  TRH consulted for diabetes management  Subjective: C/o being lactose intolerance but noone listens to him. No other complaints this am  Objective: Vitals:   05/05/21 2006 05/05/21 2328 05/06/21 0328 05/06/21 0722  BP: 129/71 120/67 (!) 147/82 126/63  Pulse: 91 75 82 82  Resp: '16 18 16 20  '$ Temp: 98.6 F (37 C) 98.4 F (36.9 C) 97.6 F (36.4 C) (!) 97.5 F (36.4 C)  TempSrc: Oral Oral Oral Oral  SpO2: 98% 99% 100% 99%  Weight:        Intake/Output Summary (Last 24 hours) at 05/06/2021 0823 Last data filed at 05/06/2021 5726 Gross per 24 hour  Intake 1510 ml  Output 2735 ml  Net -1225 ml   Filed Weights   05/04/21 0500 05/05/21 0550  Weight: 107.7 kg 105.9 kg    Examination: Calm, NAD Cta no w/r Reg s1/s2 no gallop Soft benign +bs Rt wound vac in place, dressing in place Aaoxox3  Mood and affect appropriate in current setting     Data Reviewed: I have personally reviewed following labs and imaging studies  CBC: Recent Labs  Lab 05/02/21 0153 05/03/21 0204 05/04/21 0315 05/05/21 0300 05/06/21 0150  WBC 6.9 9.7 7.6 8.4 9.1  HGB 11.3* 11.4* 9.8* 10.8* 9.0*  HCT 34.7* 33.6* 30.4* 34.5* 27.1*  MCV 101.5* 99.7 103.1* 103.6* 102.3*  PLT 335 345 321 383 203   Basic Metabolic Panel: Recent Labs  Lab 05/02/21 0153 05/03/21 0204 05/04/21 0315 05/05/21 0300 05/06/21 0150  NA 135 133* 134* 137 132*  K 3.3* 5.0 3.6 3.6 4.4  CL 100 97* 97* 98 100  CO2 '26 25 28 29 27  '$ GLUCOSE 186* 302* 232* 263* 260*  BUN '15 19 18 12 16  '$ CREATININE 0.98 0.81 0.93 0.88 0.91  CALCIUM 8.6* 8.6* 8.4* 8.8* 8.2*   GFR: Estimated Creatinine Clearance: 92.3 mL/min (by C-G formula based on SCr of 0.91 mg/dL). Liver Function  Tests: No results for input(s): AST, ALT, ALKPHOS, BILITOT, PROT, ALBUMIN in the last 168 hours. No results for input(s): LIPASE, AMYLASE in the last 168 hours. No results for input(s): AMMONIA in the last 168 hours. Coagulation Profile: No results for input(s): INR, PROTIME in the last 168 hours. Cardiac Enzymes: No results for input(s): CKTOTAL, CKMB, CKMBINDEX, TROPONINI in the last 168 hours. BNP (last 3 results) No results for input(s): PROBNP in the last 8760 hours. HbA1C: No results for input(s): HGBA1C in the last 72 hours. CBG: Recent Labs  Lab 05/05/21 1146 05/05/21 1331 05/05/21 1743 05/05/21 2105 05/06/21 0618  GLUCAP 200* 201* 326* 274* 392*   Lipid Profile: No results for input(s): CHOL, HDL, LDLCALC, TRIG, CHOLHDL, LDLDIRECT in the last 72 hours. Thyroid Function Tests: No results for input(s): TSH, T4TOTAL, FREET4, T3FREE, THYROIDAB in the last 72 hours. Anemia Panel: No results for input(s): VITAMINB12, FOLATE, FERRITIN, TIBC, IRON, RETICCTPCT in the last 72 hours. Sepsis Labs: No results for input(s): PROCALCITON, LATICACIDVEN in the last 168 hours.  Recent Results (from the past 240 hour(s))  Resp Panel by RT-PCR (Flu A&B, Covid) Nasopharyngeal Swab     Status: None   Collection Time: 04/28/21  6:40 PM   Specimen: Nasopharyngeal Swab; Nasopharyngeal(NP) swabs in  vial transport medium  Result Value Ref Range Status   SARS Coronavirus 2 by RT PCR NEGATIVE NEGATIVE Final    Comment: (NOTE) SARS-CoV-2 target nucleic acids are NOT DETECTED.  The SARS-CoV-2 RNA is generally detectable in upper respiratory specimens during the acute phase of infection. The lowest concentration of SARS-CoV-2 viral copies this assay can detect is 138 copies/mL. A negative result does not preclude SARS-Cov-2 infection and should not be used as the sole basis for treatment or other patient management decisions. A negative result may occur with  improper specimen  collection/handling, submission of specimen other than nasopharyngeal swab, presence of viral mutation(s) within the areas targeted by this assay, and inadequate number of viral copies(<138 copies/mL). A negative result must be combined with clinical observations, patient history, and epidemiological information. The expected result is Negative.  Fact Sheet for Patients:  EntrepreneurPulse.com.au  Fact Sheet for Healthcare Providers:  IncredibleEmployment.be  This test is no t yet approved or cleared by the [place] FDA and  has been authorized for detection and/or diagnosis of SARS-CoV-2 by FDA under an Emergency Use Authorization (EUA). This EUA will remain  in effect (meaning this test can be used) for the duration of the COVID-19 declaration under Section 564(b)(1) of the Act, 21 U.S.C.section 360bbb-3(b)(1), unless the authorization is terminated  or revoked sooner.       Influenza A by PCR NEGATIVE NEGATIVE Final   Influenza B by PCR NEGATIVE NEGATIVE Final    Comment: (NOTE) The Xpert Xpress SARS-CoV-2/FLU/RSV plus assay is intended as an aid in the diagnosis of influenza from Nasopharyngeal swab specimens and should not be used as a sole basis for treatment. Nasal washings and aspirates are unacceptable for Xpert Xpress SARS-CoV-2/FLU/RSV testing.  Fact Sheet for Patients: EntrepreneurPulse.com.au  Fact Sheet for Healthcare Providers: IncredibleEmployment.be  This test is not yet approved or cleared by the [place] FDA and has been authorized for detection and/or diagnosis of SARS-CoV-2 by FDA under an Emergency Use Authorization (EUA). This EUA will remain in effect (meaning this test can be used) for the duration of the COVID-19 declaration under Section 564(b)(1) of the Act, 21 U.S.C. section 360bbb-3(b)(1), unless the authorization is terminated or revoked.  Performed at [facility], [place] [address], [place], [place] [address]   Aerobic/Anaerobic Culture w Gram Stain (surgical/deep wound)     Status: None (Preliminary result)   Collection Time: 05/02/21  1:00 PM   Specimen: Wound  Result Value Ref Range Status   Specimen Description WOUND  Final   Special Requests RIGHT LEG WOUND SPEC A  Final   Gram Stain   Final    FEW WBC PRESENT, PREDOMINANTLY MONONUCLEAR NO ORGANISMS SEEN    Culture   Final    HOLDING FOR POSSIBLE ANAEROBE Performed at [facility], [place] [address] [place], [place] [address]    Report Status PENDING  Incomplete         Radiology Studies: No results found.      Scheduled Meds:  amLODipine  10 mg Oral Daily   atorvastatin  40 mg Oral QHS   benazepril  40 mg Oral Daily   clopidogrel  75 mg Oral Daily   heparin  5,000 Units Subcutaneous Q8H   hydrochlorothiazide  25 mg Oral Daily   insulin aspart  0-15 Units Subcutaneous TID WC   insulin aspart  0-5 Units Subcutaneous QHS   insulin aspart  15 Units Subcutaneous TID WC   insulin glargine-yfgn  27 Units Subcutaneous QHS   metoprolol succinate  100 mg Oral Daily   metroNIDAZOLE  500 mg Oral Q12H   multivitamin with minerals  1 tablet Oral Daily   nutrition supplement (JUVEN)  1 packet Oral BID BM   pantoprazole  40 mg Oral Daily   Ensure Max Protein  11 oz Oral Daily   tamsulosin  0.4 mg Oral QHS   Continuous Infusions:  ceFEPime (MAXIPIME) IV 2 g (05/06/21 6754)   vancomycin 1,000 mg (05/05/21 2314)    Assessment & Plan:   Principal Problem:   Infected surgical wound Active Problems:   Diabetes mellitus, type 2 ([place])   Infected surgical wound Patient presented on 2/27 and was noted to have an infected wound of previous popliteal artery bypass graft with saphenous vein placed on 2/15.  Started on empiric antibiotics vancomycin and cefepime.  CT scan of the right leg on 3/2 noted subcutaneous gas tracking to the femoral vascular sheath.  Vascular to  patient for excision and debridement of the right leg and placement of VAC dressing. wound cultures final cx pending S/p wound vac changed on 3/6 Continie iv cefepime and vancomycin Mx per primary team       Diabetes mellitus, type 2 ([place]) Uncontrolled.  Last Hemoglobin A1c 8 on 2/15.   3/7 bg elevated Increase semglee to 27units Increase novolog with meals to 15units tid Continue to monitor    DVT prophylaxis: Heparin                LOS: 8 days   Time spent: 35 min     Nolberto Hanlon, MD [facility] Pager [phone]  If 7PM-7AM, please contact night-coverage 05/06/2021, 8:23 AM

## 2021-05-06 NOTE — Care Management Important Message (Signed)
Important Message ? ?Patient Details  ?Name: Michael [name] ?MRN: [id number] ?Date of Birth: Oct 13, 1950 ? ? ?Medicare Important Message Given:  Yes ? ? ? ? ?Shelda Altes ?05/06/2021, 8:19 AM ?

## 2021-05-06 NOTE — Progress Notes (Addendum)
?  Progress Note ? ? ? ?05/06/2021 ?9:33 AM ?1 Day Post-Op ? ?Subjective:  no new complaints ? ? ?Vitals:  ? 05/06/21 0328 05/06/21 0722  ?BP: (!) 147/82 126/63  ?Pulse: 82 82  ?Resp: 16 20  ?Temp: 97.6 ?F (36.4 ?C) (!) 97.5 ?F (36.4 ?C)  ?SpO2: 100% 99%  ? ?Physical Exam: ?Lungs:  non labored ?Incisions:  dressing left in place RLE; groin wound 5 x 5 x 5 cm; upper thigh wound 5 x 3 x 3cm; mid to distal thigh wound 25 x 5 x 3cm ?Extremities:  palpable R PTA pulse ? ?CBC ?   ?Component Value Date/Time  ? WBC 9.1 05/06/2021 0150  ? RBC 2.65 (L) 05/06/2021 0150  ? HGB 9.0 (L) 05/06/2021 0150  ? HCT 27.1 (L) 05/06/2021 0150  ? PLT 317 05/06/2021 0150  ? MCV 102.3 (H) 05/06/2021 0150  ? MCH 34.0 05/06/2021 0150  ? MCHC 33.2 05/06/2021 0150  ? RDW 13.0 05/06/2021 0150  ? ? ?BMET ?   ?Component Value Date/Time  ? NA 132 (L) 05/06/2021 0150  ? NA 145 (H) 05/29/2020 1304  ? K 4.4 05/06/2021 0150  ? CL 100 05/06/2021 0150  ? CO2 27 05/06/2021 0150  ? GLUCOSE 260 (H) 05/06/2021 0150  ? BUN 16 05/06/2021 0150  ? BUN 16 05/29/2020 1304  ? CREATININE 0.91 05/06/2021 0150  ? CALCIUM 8.2 (L) 05/06/2021 0150  ? GFRNONAA >60 05/06/2021 0150  ? GFRAA 107 12/02/2017 1100  ? ? ?INR ?   ?Component Value Date/Time  ? INR 0.9 04/14/2021 1046  ? ? ? ?Intake/Output Summary (Last 24 hours) at 05/06/2021 0933 ?Last data filed at 05/06/2021 9833 ?Gross per 24 hour  ?Intake 1510 ml  ?Output 2735 ml  ?Net -1225 ml  ? ? ? ?Assessment/Plan:  71 y.o. male is s/p washout and vac change RLE incisions 1 Day Post-Op  ? ?Vac dressings with good seal ?Patent bypass with palpable R PTA pulse ?TOC consulted for home vac and [place] RN ?Can probably de-escalate antibiotics soon; no growth on intra-op cultures after 4 days ?Home when [facility] and vac arranged ? ? ?Dagoberto Ligas, PA-C ?[facility] ?[phone] ?05/06/2021 ?9:33 AM ? ?I agree with the above.  Plan for Vac change at the bedside tomorrow.   Daughter updated via phone. ? ?Annamarie Major ? ?

## 2021-05-06 NOTE — Progress Notes (Signed)
Pharmacy Antibiotic Note ? ?KNOXX BOEDING is a 71 y.o. male admitted on 04/28/2021 with  s/p R femoral above-knee popliteal artery bypass graph on 04/16/2021 now with redness and drainage.  Pharmacy has been consulted for vancomycin and cefepime dosing for concern of infected R femoral-popliteal bypass graft. Patient reported PCN allergy - back when 71 years old given PCN in doctor's office and started choking, so patient started on cefepime instead of Zosyn. ?  ?Vancomycin AUC is 586 and therapeutic at the higher end per 3/7 levels. Will decrease to '750mg'$  q12 hr with expected AUC at 439. Metronidazole added for possible anaerobe in culture. ? ? ?Plan: ?Cefepime 2 g IV q8h ?Decrease vancomycin 750 mg IV q12h ?Metronidazole per team  ?Monitor cultures, clinical status, renal function, vancomycin level ?Narrow abx as able and f/u duration  ? ? ? ?Temp (24hrs), Avg:97.7 ?F (36.5 ?C), Min:97.1 ?F (36.2 ?C), Max:98.6 ?F (37 ?C) ? ?Recent Labs  ?Lab 05/02/21 ?0153 05/02/21 ?0932 05/03/21 ?4970 05/04/21 ?2637 05/05/21 ?0300 05/06/21 ?0150 05/06/21 ?8588  ?WBC 6.9  --  9.7 7.6 8.4 9.1  --   ?CREATININE 0.98  --  0.81 0.93 0.88 0.91  --   ?[place]  --  15  --   --   --   --  17  ?VANCOPEAK  --   --   --   --   --  56  --   ? ?  ?Estimated Creatinine Clearance: 92.3 mL/min (by C-G formula based on SCr of 0.91 mg/dL).   ? ?Allergies  ?Allergen Reactions  ? Demerol Anaphylaxis  ? Lactose Intolerance (Gi) Anaphylaxis  ? Oxycodone Anxiety and Other (See Comments)  ?  Suicidal ideation  ? Penicillins Anaphylaxis and Other (See Comments)  ? Actos [Pioglitazone] Nausea Only  ? Exenatide Nausea Only and Other (See Comments)  ? Glimepiride Other (See Comments)  ?  Edema in ankles ?  ? Nabumetone Nausea Only and Other (See Comments)  ? Victoza [Liraglutide] Nausea Only  ?  Shaking  ? Fentanyl Anxiety  ?  Jittery   ? Hydrocodone Anxiety  ?  Suicidal ideation  ? ? ?Antimicrobials this admission: ?Cefepime 2/27 >>  ?Vanc 2/27 >>  ?MTZ  3/6>> ? ?Vancomycin levels  ?3/7 VP 33; VT 17; AUC 586- decrease vanc to '750mg'$  q12 ? ?Dose adjustments this admission: ?2/28 Cefepime adjusted for improved renal fxn ?3/1 Vanc adjusted for improved renal fxn ? ?Microbiology results: ?3/3 wound cx: possible anaerobe  ? ?Thank you for allowing pharmacy to be a part of this patient?s care. ? ? ?Benetta Spar, PharmD, BCPS, BCCP ?Clinical Pharmacist ? ?Please check AMION for all [place] phone numbers ?After 10:00 PM, call [place] [phone] ? ?

## 2021-05-06 NOTE — Progress Notes (Signed)
Mobility Specialist Progress Note ? ? 05/06/21 1545  ?Mobility  ?Activity Refused mobility  ? ?Pt preferring to rest before procedure tomorrow. Will f/u tomorrow if time permits.  ? ?Holland Falling ?Mobility Specialist ?Phone Number [phone] ? ?

## 2021-05-06 NOTE — Progress Notes (Signed)
Inpatient Diabetes Program Recommendations ? ?AACE/ADA: New Consensus Statement on Inpatient Glycemic Control (2015) ? ?Target Ranges:  Prepandial:   less than 140 mg/dL ?     Peak postprandial:   less than 180 mg/dL (1-2 hours) ?     Critically ill patients:  140 - 180 mg/dL  ? ?Lab Results  ?Component Value Date  ? GLUCAP 256 (H) 05/06/2021  ? HGBA1C 8.0 (H) 04/16/2021  ? ? ?Review of Glycemic Control ? Latest Reference Range & Units 05/05/21 09:23 05/05/21 11:46 05/05/21 13:31 05/05/21 17:43 05/05/21 21:05 05/06/21 06:18 05/06/21 11:20  ?Glucose-Capillary 70 - 99 mg/dL 202 (H) 200 (H) 201 (H) 326 (H) 274 (H) 392 (H) 256 (H)  ? ?Diabetes history: DM 2 ?Outpatient Diabetes medications:  ?Farxiga 5 mg daily ?Dulaglutide 1.5 mg weekly ?Humalog 34 units bid ?Lantus 10 units daily ?Metformin-XR 2000 mg q HS ?Current orders for Inpatient glycemic control:  ?Novolog moderate tid with meals and HS ?Novolog 15 units tid with meals ?Semglee 27 units q HS ?Inpatient Diabetes Program Recommendations:   ? ?Patient received Decadron 5 mg pre-op which could have increased blood sugars. Agree with current orders.  ? ?Thanks,  ?Adah Perl, RN, BC-ADM ?Inpatient Diabetes Coordinator ?Pager [phone]   (8a-5p) ? ?

## 2021-05-07 ENCOUNTER — Other Ambulatory Visit (HOSPITAL_COMMUNITY): Payer: Self-pay

## 2021-05-07 DIAGNOSIS — E871 Hypo-osmolality and hyponatremia: Secondary | ICD-10-CM

## 2021-05-07 DIAGNOSIS — E669 Obesity, unspecified: Secondary | ICD-10-CM

## 2021-05-07 DIAGNOSIS — R799 Abnormal finding of blood chemistry, unspecified: Secondary | ICD-10-CM

## 2021-05-07 DIAGNOSIS — D539 Nutritional anemia, unspecified: Secondary | ICD-10-CM

## 2021-05-07 LAB — CBC
HCT: 32 % — ABNORMAL LOW (ref 39.0–52.0)
Hemoglobin: 10.2 g/dL — ABNORMAL LOW (ref 13.0–17.0)
MCH: 32.7 pg (ref 26.0–34.0)
MCHC: 31.9 g/dL (ref 30.0–36.0)
MCV: 102.6 fL — ABNORMAL HIGH (ref 80.0–100.0)
Platelets: 356 10*3/uL (ref 150–400)
RBC: 3.12 MIL/uL — ABNORMAL LOW (ref 4.22–5.81)
RDW: 13.3 % (ref 11.5–15.5)
WBC: 10.1 10*3/uL (ref 4.0–10.5)
nRBC: 0 % (ref 0.0–0.2)

## 2021-05-07 LAB — BASIC METABOLIC PANEL
Anion gap: 10 (ref 5–15)
BUN: 25 mg/dL — ABNORMAL HIGH (ref 8–23)
CO2: 27 mmol/L (ref 22–32)
Calcium: 8.7 mg/dL — ABNORMAL LOW (ref 8.9–10.3)
Chloride: 100 mmol/L (ref 98–111)
Creatinine, Ser: 0.94 mg/dL (ref 0.61–1.24)
GFR, Estimated: >60 mL/min (ref >60)
Glucose, Bld: 208 mg/dL — ABNORMAL HIGH (ref 70–99)
Potassium: 3.8 mmol/L (ref 3.5–5.1)
Sodium: 137 mmol/L (ref 135–145)

## 2021-05-07 LAB — GLUCOSE, CAPILLARY
Glucose-Capillary: 221 mg/dL — ABNORMAL HIGH (ref 70–99)
Glucose-Capillary: 174 mg/dL — ABNORMAL HIGH (ref 70–99)

## 2021-05-07 MED ORDER — HYDROCODONE-ACETAMINOPHEN 5-325 MG PO TABS
1.0000 | ORAL_TABLET | Freq: Four times a day (QID) | ORAL | 0 refills | Status: DC | PRN
  Filled 2021-05-07: qty 20, 5d supply, fill #0

## 2021-05-07 MED ORDER — METRONIDAZOLE 500 MG PO TABS
500.0000 mg | ORAL_TABLET | Freq: Two times a day (BID) | ORAL | 0 refills | Status: AC
  Filled 2021-05-07: qty 28, 14d supply, fill #0

## 2021-05-07 MED ORDER — CEFADROXIL 500 MG PO CAPS
1000.0000 mg | ORAL_CAPSULE | Freq: Two times a day (BID) | ORAL | 0 refills | Status: AC
  Filled 2021-05-07: qty 56, 14d supply, fill #0

## 2021-05-07 NOTE — Hospital Course (Signed)
Patient is a 71 year old obese Caucasian male who presented with an infected right lower extremity femoral-popliteal bypass with saphenous vein and is status post washout and VAC change on the right lower extremity on 05/05/2021.  Postoperatively he had elevated and uncontrolled blood sugars and tried hospitalist was consulted for diabetes management.  His blood sugars are doing fairly well on current regimen and he is stable from a medical perspective to be discharged home with wound VAC once vascular surgery has cleared the patient from their perspective ?

## 2021-05-07 NOTE — Assessment & Plan Note (Signed)
-  Complicates overall prognosis and care ?-Estimated body mass index is 31.66 kg/m? as calculated from the following: ?  Height as of an earlier encounter on 04/28/21: 6' (1.829 m). ?  Weight as of this encounter: 105.9 kg.  ?-Weight Loss and Dietary Counseling given ? ?

## 2021-05-07 NOTE — Progress Notes (Addendum)
?  Progress Note ? ? ? ?05/07/2021 ?8:28 AM ?2 Days Post-Op ? ?Subjective:  no new complaints ? ? ?Vitals:  ? 05/06/21 2348 05/07/21 0332  ?BP: 124/69 129/76  ?Pulse: 73 86  ?Resp: 16 20  ?Temp: 97.9 ?F (36.6 ?C) 97.6 ?F (36.4 ?C)  ?SpO2: 97% 99%  ? ?Physical Exam: ?Lungs:  non labored ?Incisions:  pictured below ?Extremities:  palpable R PTA ?Neurologic: A&O ? ? ? ? ? ? ?CBC ?   ?Component Value Date/Time  ? WBC 10.1 05/07/2021 0119  ? RBC 3.12 (L) 05/07/2021 0119  ? HGB 10.2 (L) 05/07/2021 0119  ? HCT 32.0 (L) 05/07/2021 0119  ? PLT 356 05/07/2021 0119  ? MCV 102.6 (H) 05/07/2021 0119  ? MCH 32.7 05/07/2021 0119  ? MCHC 31.9 05/07/2021 0119  ? RDW 13.3 05/07/2021 0119  ? ? ?BMET ?   ?Component Value Date/Time  ? NA 137 05/07/2021 0119  ? NA 145 (H) 05/29/2020 1304  ? K 3.8 05/07/2021 0119  ? CL 100 05/07/2021 0119  ? CO2 27 05/07/2021 0119  ? GLUCOSE 208 (H) 05/07/2021 0119  ? BUN 25 (H) 05/07/2021 0119  ? BUN 16 05/29/2020 1304  ? CREATININE 0.94 05/07/2021 0119  ? CALCIUM 8.7 (L) 05/07/2021 0119  ? GFRNONAA >60 05/07/2021 0119  ? GFRAA 107 12/02/2017 1100  ? ? ?INR ?   ?Component Value Date/Time  ? INR 0.9 04/14/2021 1046  ? ? ? ?Intake/Output Summary (Last 24 hours) at 05/07/2021 0828 ?Last data filed at 05/07/2021 7858 ?Gross per 24 hour  ?Intake 870 ml  ?Output 3400 ml  ?Net -2530 ml  ? ? ? ?Assessment/Plan:  71 y.o. male is s/p washout and vac change  2 Days Post-Op  ? ?-Vac taken down this morning; wet to dry reapplied; will consult wound care team to reapply vac to all three wounds ?-RLE well perfused with palpable R PTA pulse ?-Ok for discharge home today if [facility] RN arranged and home vac delivered to room ?-Will ask pharmacy for recommendation on p.o. antibiotics ? ? ?Dagoberto Ligas, PA-C ?[facility] ?[phone] ?05/07/2021 ?8:28 AM ? ? ? ?

## 2021-05-07 NOTE — TOC Transition Note (Addendum)
Transition of Care (TOC) - CM/SW Discharge Note ?Marvetta Gibbons Therapist, sports, [name] ?Transitions of Care ?Unit 4E- RN Case Manager ?See Treatment Team for direct phone #  ? ? ?Patient Details  ?Name: Michael [name] ?MRN: [id number] ?Date of Birth: 08/09/1950 ? ?Transition of Care (TOC) CM/SW Contact:  ?Dahlia Client, Romeo Rabon, RN ?Phone Number: ?05/07/2021, 2:15 PM ? ? ?Clinical Narrative:    ?Pt stable for transition home today pending Home wound VAC approval.  ?Checked with Olivia Mackie at [place] is still pending approval this am- updated order with date and re-faxed per [facility] request.  ? ?1140-Per bedside RN- wife stating pt will need RW for home- order placed and call made to [employer] for DME need- RW to be delivered to room prior to discharge.  ? ?TOC pharmacy to deliver meds to bedside prior to discharge.  ? ?1320- Received msg from [place] w/ [place] has been approved- she will send POD via email for delivery purpose- CM will pick up VAC once POD received.  ? ?1415- POC received via email from [employer]- Ready VAC picked up from [facility] - and serial # matched. Home VAC delivered to pt's room and wife signed paperwork. POD faxed back to [facility] at [employer]. Paperwork left with pt along with supplies.  ?Bedside RNJeraldine Loots at the bedside to transition pt to home [facility].  ? ?CM has updated Corene Cornea w/ [facility] for start of care- [facility] RN will plan visit on Friday 3/10- drsg was changed here today by [facility] nurse.  ? ?Wife at bedside and will transport home.  ? ? ?Final next level of care: [place] ?Barriers to Discharge: Barriers Resolved ? ? ?Patient Goals and CMS Choice ?Patient states their goals for this hospitalization and ongoing recovery are:: return home w/ wife, get back to doing normal things ?CMS Medicare.gov Compare Post Acute Care list provided to:: Patient ?Choice offered to / list presented to : Patient, Spouse ? ?Discharge Placement ?  ?           ?  ? Home w/ HH ?  ?  ? ?Discharge Plan and Services ?  ?Discharge  Planning Services: CM Consult ?Post Acute Care Choice: Durable Medical Equipment, Home Health          ?DME Arranged: [place] rolling, Vac ?DME Agency: [employer] [employer] ?Date DME Agency Contacted: 05/07/21 ?Time DME Agency Contacted: 1660 ?Representative spoke with at DME Agency: Freda Munro ?HH Arranged: RN ?[place] Agency: Microbiologist ([place]) ?Date HH Agency Contacted: 05/06/21 ?Time [place]: 6301 ?Representative spoke with at [place]: Corene Cornea ? ?Social Determinants of Health (SDOH) Interventions ?  ? ? ?Readmission Risk Interventions ?Readmission Risk Prevention Plan 05/07/2021 05/06/2021  ?Transportation Screening - Complete  ?PCP or Specialist Appt within 5-7 Days - Complete  ?PCP or Specialist Appt within 3-5 Days Complete -  ?Home Care Screening - Complete  ?Medication Review (RN CM) - Complete  ?[place] or Home Care Consult Complete -  ?Social Work Consult for [place] Planning/Counseling Complete -  ?Palliative Care Screening Not Applicable -  ?Medication Review Press photographer) Complete -  ?Some recent data might be hidden  ? ? ? ? ? ?

## 2021-05-07 NOTE — Assessment & Plan Note (Signed)
-  In the setting of Steroid Demargination as patient received Decadron 5 mg Pre-Op ?-Continue to Monitor and Trend in the outpatient setting and repeat CMP within 1 week ?-No S/Sx of Melena to indicate Upper GI Bleed ?

## 2021-05-07 NOTE — Assessment & Plan Note (Signed)
-  Patient's hemoglobin/hematocrit went from 9.0/27.1 and is now 10.2/32.0 and is stable.  MCV is 102.6 ?-Check anemia panel in outpatient setting ?-Continue to monitor for signs and symptoms of bleeding; currently no overt bleeding noted ?-Repeat CBC within 1 week ?

## 2021-05-07 NOTE — Consult Note (Addendum)
WOC Nurse Consult Note: ?Patient receiving care in [place] ?2 days Post-Op s/p washout right upper thigh and groin.  ?Due for DC today if HH vac is approved and received.  ?Reason for Consult: Replace vac  ?Wound type: Surgical groin, upper thigh and mid/distal thigh.  ?Pressure Injury POA: NA ?Measurement: Mid/distal thigh measures 18.5 cm x 5.6 cm x 2.3 cm  ?Upper medial thigh measures 8 cm x 4 cm x 4 cm  ?Groin measures 8 cm x 5 cm x 4.5 cm ?Wound bed: See photos taken today by Dagoberto Ligas, PA-C ?Drainage (amount, consistency, odor) Serous in the canister ?Periwound: intact ?Dressing procedure/placement/frequency: ?W/D dressings removed. Mepitel placed in the upper thigh and mid/distal thigh wounds. One piece of black foam placed into each of the three separate wounds and bridged together with 2 pieces of black foam, barrier ring used in the creases of the groin to help prevent leakage. Drape applied. 1 trac pad used. 5 pieces of black foam total. Suction acquired at 125 mmHg. Adapters for extra trac pads if needed were placed into a glove and given to the patient to take home. HH RN will need extra trac pads in case they can not get the bridge to suction. Still waiting for home vac to be delivered.  ? ?WOC will see again on Friday if patient is still inpatient but he is due to be discharged today. ? ?Cathlean Marseilles. Tamala Julian, MSN, RN, CMSRN, AGCNS, WTA ?Wound Treatment Associate ?Pager [phone]  ? ? ?  ?

## 2021-05-07 NOTE — Assessment & Plan Note (Signed)
-  Mild and Improved ?-Na+ went from 132 -> 137 ?-Continue to monitor and trend and repeat CMP in outpatient setting ?

## 2021-05-07 NOTE — Progress Notes (Addendum)
?Progress Note ? ? ?Patient: Michael [name] EXN:[id number] DOB: [birth year] DOA: 04/28/2021     9 ?DOS: the patient was seen and examined on 05/07/2021 ?  ?Brief hospital course: ?Patient is a 71 year old obese Caucasian male who presented with an infected right lower extremity femoral-popliteal bypass with saphenous vein and is status post washout and VAC change on the right lower extremity on 05/05/2021.  Postoperatively he had elevated and uncontrolled blood sugars and tried hospitalist was consulted for diabetes management.  His blood sugars are doing fairly well on current regimen and he is stable from a medical perspective to be discharged home with wound VAC once vascular surgery has cleared the patient from their perspective ? ?Assessment and Plan: ?* Infected surgical wound ?-Patient presented on 2/27 and was noted to have an infected wound of previous popliteal artery bypass graft with saphenous vein placed on 2/15.   ?-Started on empiric antibiotics vancomycin and cefepime. Being transitioned to po Abx by Vascular Surgery  ?-CT scan of the right leg on 3/2 noted subcutaneous gas tracking to the femoral vascular sheath.   ?-Vascular took patient for excision and debridement of the right leg and placement of VAC dressing. ?-Per Vascular the VAC was taken down this AM and wet to dry reapplied and WOC team consulted to reapply Vac; Per Vascular RLE is well perfused ?-Follow-up cultures: Showing Few WBC present with Cx growing Few Prevotella Species, Rare Bacteroides Vulgatus, and Beta Lactamase Positive ?-Care Per vascular surgery ? ?Elevated BUN ?-In the setting of Steroid Demargination as patient received Decadron 5 mg Pre-Op ?-Continue to Monitor and Trend in the outpatient setting and repeat CMP within 1 week ?-No S/Sx of Melena to indicate Upper GI Bleed ? ?Hyponatremia ?-Mild and Improved ?-Na+ went from 132 -> 137 ?-Continue to monitor and trend and repeat CMP in outpatient setting ? ?Macrocytic  anemia ?-Patient's hemoglobin/hematocrit went from 9.0/27.1 and is now 10.2/32.0 and is stable.  MCV is 102.6 ?-Check anemia panel in outpatient setting ?-Continue to monitor for signs and symptoms of bleeding; currently no overt bleeding noted ?-Repeat CBC within 1 week ? ?Obesity (BMI 30-39.9) ?-Complicates overall prognosis and care ?-Estimated body mass index is 31.66 kg/m? as calculated from the following: ?  Height as of an earlier encounter on 04/28/21: 6' (1.829 m). ?  Weight as of this encounter: 105.9 kg.  ?-Weight Loss and Dietary Counseling given ? ? ?Diabetes mellitus, type 2 ([place]) ?-Uncontrolled.   ?-Last Hemoglobin A1c noted to be 8 on 2/15.  -Current regimen includes Semglee 27 units nightly, Novolog Moderated AC/HS and Novolog 15 units TID with meals ?-Appreciate of diabetic education consultative services; Recommend outpatient follow up ?-Resume home medications of Farxiga 5 mg p.o. daily, do outside 1.5 mg weekly, Humalog 34 units twice daily, Lantus 10 units daily, and metformin XR 2000 mg p.o. nightly as an out patient with close PCP follow-up ?-CBGs now ranging from 114-256 ?-Consult dietitian for dietary education ? ? ?Nutrition Documentation   ? ?Flowsheet Row Admission (Current) from 04/28/2021 in [facility] 4E CV SURGICAL PROGRESSIVE CARE  ?Nutrition Problem Increased nutrient needs  ?Etiology post-op healing, wound healing  [infected surgical wound to right groin]  ?Nutrition Goal Patient will meet greater than or equal to 90% of their needs  ?Interventions MVI, Juven, Education, Other (Comment)  [Ensure Max]  ? ?  ? ?Subjective: Seen and examined at bedside and he states he is doing fairly well.  Blood sugars are better controlled.  Ready to go  home.  No nausea or vomiting.  States that he is anxious to get out of here.  No other concerns or complaints at this time ? ?Physical Exam: ?Vitals:  ? 05/06/21 1948 05/06/21 2348 05/07/21 3570 05/07/21 1779  ?BP: 131/74 124/69 129/76 (!) 151/81  ?Pulse:  75 73 86 85  ?Resp: '18 16 20 16  '$ ?Temp: 98.2 ?F (36.8 ?C) 97.9 ?F (36.6 ?C) 97.6 ?F (36.4 ?C) 98 ?F (36.7 ?C)  ?TempSrc: Oral Oral Oral Oral  ?SpO2: 98% 97% 99% 99%  ?Weight:      ? ?Examination: ?Physical Exam: ? ?Constitutional: WN/WD obese Caucasian male in NAD and appears calm and comfortable ?Respiratory: Diminished to auscultation bilaterally, no wheezing, rales, rhonchi or crackles. ?Cardiovascular: RRR, no murmurs / rubs / gallops. S1 and S2 auscultated. No extremity edema.  ?Abdomen: Soft, non-tender, Distended.  ?GU: Deferred. ?Musculoskeletal: Right Leg wrapped ? ? ?Data Reviewed: ? ?I have independently reviewed and interpreted the patient's clinical laboratory data and it shows that he has a slightly elevated blood glucose level this morning 1 BMP but CBGs ranging from 114-256 on last 4 checks, hemoglobin/hematocrit is now 10.2/32.0 with an MCV of 102.6 ? ?Family Communication: Discussed with the wife at bedside ? ?Disposition: ?Status is: Inpatient ?Remains inpatient appropriate because: Pending vascular discharging ? ?Planned Discharge Destination: Home with Home Health ? ?DVT prophylaxis: Heparin 5000 units subcu every 8 ? ?Author: ?Raiford Noble, DO ?[facility]  ?05/07/2021 11:33 AM ? ?For on call review [email or web address].  ?

## 2021-05-08 NOTE — Discharge Summary (Signed)
?Discharge Summary ? ?Patient ID: ?Michael [name] ?[id number] ?71 y.o. ?1951/02/14 ? ?Admit date: 04/28/2021 ? ?Discharge date and time: 05/07/2021  3:16 PM  ? ?Admitting Physician: Serafina Mitchell, MD  ? ?Discharge Physician: same ? ?Admission Diagnoses: Infected surgical wound [T81.49XA] ? ?Discharge Diagnoses: same ? ?Admission Condition: fair ? ?Discharged Condition: fair ? ?Indication for Admission: Infected surgical wound ? ?Hospital Course: Mr. Michael [name] is a 71 year old male who underwent right femoral to popliteal bypass with vein by Dr. Trula Slade in mid February 2023.  He presented to the office with infected surgical wounds and was admitted to the hospital for IV antibiotics and wound care.  Eventually CT was performed which demonstrated gas along the path of the bypass in the right leg.  He was brought to the operating room on 05/02/2021 by Dr. Scot Dock and underwent excision and debridement of all 4 incisions of the right leg and placement of wound VAC.  He was continued on broad-spectrum IV antibiotics.  He was brought back to the operating room on 05/05/2021 by Dr. Stanford Breed for further debridement and wound VAC change.  Based on the appearance of the wound over the course of the hospital stay after several debridements he was ready for discharge home.  Transition of care team was consulted for arrangement of home health RN for home wound VAC changes.  Based on intraoperative cultures, pharmacy recommended a p.o. antibiotic regimen which will be reflected in the patient's discharge med reconciliation.  He will follow-up in office in about 2 to 3 weeks for evaluation of surgical wounds.  He was discharged home in stable condition. ? ?It should also be mentioned that during his hospital stay patient remained hyperglycemic.  [facility] was consulted for management of hyperglycemia during inpatient stay.  Recommendations are reflected in his discharge med reconciliation.  He will also have short  order follow-up with his PCP for continued observation of hyperglycemia. ? ?Consults:  Hospitalist medicine ? ?Treatments: surgery: Excision and debridement of 4 incisions of the right leg and placement of VAC dressings by Dr. Scot Dock on 3/3 ?Further debridement and wound VAC change by Dr. Stanford Breed on 3/6 ? ?Discharge Exam: See progress note 05/07/21 ?Vitals:  ? 05/07/21 0855 05/07/21 1141  ?BP: (!) 151/81 123/70  ?Pulse: 85 84  ?Resp: 16 16  ?Temp: 98 ?F (36.7 ?C) 98.6 ?F (37 ?C)  ?SpO2: 99% 98%  ? ? ? ?Disposition: Discharge disposition: 01-Home or Self Care ? ? ? ? ? ? ?Patient Instructions:  ?Allergies as of 05/07/2021   ? ?   Reactions  ? Demerol Anaphylaxis  ? Lactose Intolerance (gi) Anaphylaxis  ? Oxycodone Anxiety, Other (See Comments)  ? Suicidal ideation  ? Penicillins Anaphylaxis, Other (See Comments)  ? Tolerates cefepime   ? Actos [pioglitazone] Nausea Only  ? Exenatide Nausea Only, Other (See Comments)  ? Glimepiride Other (See Comments)  ? Edema in ankles  ? Nabumetone Nausea Only, Other (See Comments)  ? Victoza [liraglutide] Nausea Only  ? Shaking  ? Fentanyl Anxiety  ? Jittery   ? Hydrocodone Anxiety  ? Suicidal ideation  ? ?  ? ?  ?Medication List  ?  ? ?TAKE these medications   ? ?Accu-Chek Aviva Plus test strip ?Generic drug: glucose blood ?AS DIRECTED SEVEN TIMES A DAY IN VITRO 90 DAYS ?  ?acetaminophen 500 MG tablet ?Commonly known as: TYLENOL ?Take 1,000 mg by mouth every 6 (six) hours as needed for mild pain. ?  ?amLODipine 10 MG  tablet ?Commonly known as: NORVASC ?Take 10 mg by mouth daily. ?  ?atorvastatin 40 MG tablet ?Commonly known as: LIPITOR ?Take 40 mg by mouth at bedtime. ?  ?benazepril 40 MG tablet ?Commonly known as: LOTENSIN ?Take 40 mg by mouth daily. ?  ?cefadroxil 500 MG capsule ?Commonly known as: DURICEF ?Take 2 capsules (1,000 mg total) by mouth 2 (two) times daily. ?  ?clonazePAM 1 MG tablet ?Commonly known as: KLONOPIN ?Take 1 mg by mouth at bedtime as needed (sleep). ?   ?clopidogrel 75 MG tablet ?Commonly known as: PLAVIX ?TAKE 1 TABLET BY MOUTH EVERY DAY WITH BREAKFAST ?What changed: See the new instructions. ?  ?dapagliflozin propanediol 5 MG Tabs tablet ?Commonly known as: FARXIGA ?5 mg daily. ?  ?Dulaglutide 1.5 MG/0.5ML Sopn ?Inject 1.5 mg into the skin once a week. Sunday ?  ?famotidine 20 MG tablet ?Commonly known as: PEPCID ?Take 20 mg by mouth 2 (two) times daily. ?  ?fluticasone 50 MCG/ACT nasal spray ?Commonly known as: FLONASE ?Place 2 sprays into both nostrils at bedtime as needed for allergies (before uses CPAP if needed). ?  ?folic acid 1 MG tablet ?Commonly known as: FOLVITE ?Take 1 mg by mouth daily. ?  ?FreeStyle Libre 14 Day Reader Kerrin Mo ?use to monitor blood sugar ?  ?HumaLOG KwikPen 100 UNIT/ML KwikPen ?Generic drug: insulin lispro ?Inject 34 Units into the skin 2 (two) times daily. ?  ?hydrochlorothiazide 25 MG tablet ?Commonly known as: HYDRODIURIL ?Take 1 tablet (25 mg total) by mouth daily. Please make overdue appt with Dr. Irish Lack before anymore refills. 2nd attempt ?  ?HYDROcodone-acetaminophen 5-325 MG tablet ?Commonly known as: Norco ?Take 1 tablet by mouth every 6 (six) hours as needed for moderate pain. ?  ?ibuprofen 200 MG tablet ?Commonly known as: ADVIL ?Take 400-800 mg by mouth daily as needed for headache. ?  ?insulin glargine 100 UNIT/ML injection ?Commonly known as: LANTUS ?Inject 10 Units into the skin at bedtime. ?  ?metFORMIN 500 MG 24 hr tablet ?Commonly known as: GLUCOPHAGE-XR ?Take 2,000 mg by mouth at bedtime. ?  ?methotrexate 2.5 MG tablet ?Take 15 mg by mouth every Tuesday. 6 tablets weekly ?  ?metoprolol succinate 100 MG 24 hr tablet ?Commonly known as: TOPROL-XL ?Take 100 mg by mouth daily. ?  ?metroNIDAZOLE 500 MG tablet ?Commonly known as: Flagyl ?Take 1 tablet (500 mg total) by mouth 2 (two) times daily for 14 days. ?  ?solifenacin 5 MG tablet ?Commonly known as: VESICARE ?Take 5 mg by mouth daily. ?  ?tamsulosin 0.4 MG Caps  capsule ?Commonly known as: FLOMAX ?Take 0.4 mg by mouth at bedtime. ?  ?traZODone 50 MG tablet ?Commonly known as: DESYREL ?Take 50 mg by mouth at bedtime as needed for sleep. ?  ? ?  ? ?Activity: activity as tolerated ?Diet: regular diet ?Wound Care: keep wound clean and dry; home health RN arranged for wound VAC changes ? ?Follow-up with [facility] in 2 weeks. ? ?Signed: ?Dagoberto Ligas, PA-C ?05/08/2021 ?9:30 AM ?[facility] Office: [phone] ? ?

## 2021-05-10 DIAGNOSIS — E876 Hypokalemia: Secondary | ICD-10-CM | POA: Diagnosis not present

## 2021-05-10 DIAGNOSIS — K219 Gastro-esophageal reflux disease without esophagitis: Secondary | ICD-10-CM | POA: Diagnosis not present

## 2021-05-10 DIAGNOSIS — G4733 Obstructive sleep apnea (adult) (pediatric): Secondary | ICD-10-CM | POA: Diagnosis not present

## 2021-05-10 DIAGNOSIS — T827XXA Infection and inflammatory reaction due to other cardiac and vascular devices, implants and grafts, initial encounter: Secondary | ICD-10-CM | POA: Diagnosis not present

## 2021-05-10 DIAGNOSIS — I10 Essential (primary) hypertension: Secondary | ICD-10-CM | POA: Diagnosis not present

## 2021-05-10 DIAGNOSIS — Z6829 Body mass index (BMI) 29.0-29.9, adult: Secondary | ICD-10-CM | POA: Diagnosis not present

## 2021-05-10 DIAGNOSIS — E1165 Type 2 diabetes mellitus with hyperglycemia: Secondary | ICD-10-CM | POA: Diagnosis not present

## 2021-05-10 DIAGNOSIS — Z7902 Long term (current) use of antithrombotics/antiplatelets: Secondary | ICD-10-CM | POA: Diagnosis not present

## 2021-05-10 DIAGNOSIS — F419 Anxiety disorder, unspecified: Secondary | ICD-10-CM | POA: Diagnosis not present

## 2021-05-10 DIAGNOSIS — E669 Obesity, unspecified: Secondary | ICD-10-CM | POA: Diagnosis not present

## 2021-05-10 DIAGNOSIS — D539 Nutritional anemia, unspecified: Secondary | ICD-10-CM | POA: Diagnosis not present

## 2021-05-10 DIAGNOSIS — E1151 Type 2 diabetes mellitus with diabetic peripheral angiopathy without gangrene: Secondary | ICD-10-CM | POA: Diagnosis not present

## 2021-05-10 DIAGNOSIS — B9689 Other specified bacterial agents as the cause of diseases classified elsewhere: Secondary | ICD-10-CM | POA: Diagnosis not present

## 2021-05-10 DIAGNOSIS — B966 Bacteroides fragilis [B. fragilis] as the cause of diseases classified elsewhere: Secondary | ICD-10-CM | POA: Diagnosis not present

## 2021-05-10 DIAGNOSIS — Z7985 Long-term (current) use of injectable non-insulin antidiabetic drugs: Secondary | ICD-10-CM | POA: Diagnosis not present

## 2021-05-10 DIAGNOSIS — E785 Hyperlipidemia, unspecified: Secondary | ICD-10-CM | POA: Diagnosis not present

## 2021-05-13 DIAGNOSIS — I10 Essential (primary) hypertension: Secondary | ICD-10-CM | POA: Diagnosis not present

## 2021-05-13 DIAGNOSIS — E1122 Type 2 diabetes mellitus with diabetic chronic kidney disease: Secondary | ICD-10-CM | POA: Diagnosis not present

## 2021-05-13 DIAGNOSIS — N181 Chronic kidney disease, stage 1: Secondary | ICD-10-CM | POA: Diagnosis not present

## 2021-05-13 DIAGNOSIS — E782 Mixed hyperlipidemia: Secondary | ICD-10-CM | POA: Diagnosis not present

## 2021-05-15 ENCOUNTER — Other Ambulatory Visit: Payer: Self-pay | Admitting: Physician Assistant

## 2021-05-15 MED ORDER — HYDROCODONE-ACETAMINOPHEN 5-325 MG PO TABS: 1.0000 | ORAL_TABLET | Freq: Four times a day (QID) | ORAL | 0 refills | Status: DC | PRN

## 2021-05-20 ENCOUNTER — Other Ambulatory Visit (HOSPITAL_COMMUNITY): Payer: Self-pay

## 2021-05-20 ENCOUNTER — Telehealth: Payer: Self-pay

## 2021-05-20 NOTE — Telephone Encounter (Signed)
Pt called with c/o diarrhea 4-6 x daily for the last few days. He finishes his last dose of antibiotics tomorrow. I have advised him to try probiotics and yogurt, and BRAT diet and see if that helps. If it does not improve over the next few days or symptoms worsen, pt is aware to call us back or his PCP.

## 2021-05-21 ENCOUNTER — Other Ambulatory Visit: Payer: Self-pay | Admitting: Physician Assistant

## 2021-05-21 ENCOUNTER — Telehealth: Payer: Self-pay

## 2021-05-21 DIAGNOSIS — I739 Peripheral vascular disease, unspecified: Secondary | ICD-10-CM

## 2021-05-21 MED ORDER — HYDROCODONE-ACETAMINOPHEN 5-325 MG PO TABS: 1.0000 | ORAL_TABLET | Freq: Four times a day (QID) | ORAL | 0 refills | Status: DC | PRN

## 2021-05-21 NOTE — Telephone Encounter (Signed)
Pt called to request more pain medication. He feels pain from his "waist band down to leg" and was unable to sleep last night. He is returning for f/u in 2 weeks and is aware he will need to call us back and move up his f/u if the pain persists or worsens. He states his diarrhea has resolved. APP has prescribed more pain medication. No further questions/complaints at this time. ?

## 2021-05-30 ENCOUNTER — Telehealth: Payer: Self-pay

## 2021-05-30 NOTE — Telephone Encounter (Signed)
Pt called to get another refill of narcotics. I have advised him to try XS Tylenol which he is agreeable to. Pt is coming in Monday for his post op visit. No further questions/concerns at this time. ?

## 2021-06-02 ENCOUNTER — Ambulatory Visit (INDEPENDENT_AMBULATORY_CARE_PROVIDER_SITE_OTHER): Payer: Medicare Other | Admitting: Surgery

## 2021-06-02 ENCOUNTER — Encounter: Payer: Self-pay | Admitting: Surgery

## 2021-06-02 VITALS — BP 106/68 | HR 88 | Temp 98.3°F | Resp 20 | Ht 72.0 in | Wt 224.0 lb

## 2021-06-02 DIAGNOSIS — I70211 Atherosclerosis of native arteries of extremities with intermittent claudication, right leg: Secondary | ICD-10-CM

## 2021-06-02 MED ORDER — OXYCODONE-ACETAMINOPHEN 5-325 MG PO TABS: 1.0000 | ORAL_TABLET | ORAL | 0 refills | Status: DC | PRN

## 2021-06-02 NOTE — Progress Notes (Signed)
? ?Patient name: Michael [name] MRN: [id number] DOB: [birth year] Sex: male ? ?REASON FOR VISIT:  ? ? ? Post op ? ?HISTORY OF PRESENT ILLNESS:  ? ?ANTHON HARPOLE is a 71 y.o. male with right leg claudication and failed percutaneous intervention.  On 04/16/2021 underwent right femoral to above-knee popliteal bypass graft with saphenous vein.  He developed a wound infection at the vein harvest site and was taken to the operating room on 05/02/2021 for debridement.  He went back to the operating room on 05/05/2021 for further debridement.  Cultures from his debridements were negative.  He is back for follow-up.  He stopped using the wound VAC and went to wet-to-dry dressings but these were all again changed every other day. ? ?CURRENT MEDICATIONS:  ? ? ?Current Outpatient Medications  ?Medication Sig Dispense Refill  ? ACCU-CHEK AVIVA PLUS test strip AS DIRECTED SEVEN TIMES A DAY IN VITRO 90 DAYS  4  ? acetaminophen (TYLENOL) 500 MG tablet Take 1,000 mg by mouth every 6 (six) hours as needed for mild pain.    ? amLODipine (NORVASC) 10 MG tablet Take 10 mg by mouth daily.    ? atorvastatin (LIPITOR) 40 MG tablet Take 40 mg by mouth at bedtime.  5  ? benazepril (LOTENSIN) 40 MG tablet Take 40 mg by mouth daily.    ? cefadroxil (DURICEF) 500 MG capsule Take 2 capsules (1,000 mg total) by mouth 2 (two) times daily. 56 capsule 0  ? clonazePAM (KLONOPIN) 1 MG tablet Take 1 mg by mouth at bedtime as needed (sleep).    ? clopidogrel (PLAVIX) 75 MG tablet TAKE 1 TABLET BY MOUTH EVERY DAY WITH BREAKFAST (Patient taking differently: Take 75 mg by mouth daily.) 90 tablet 3  ? Continuous Blood Gluc Receiver (FREESTYLE LIBRE 14 DAY READER) DEVI use to monitor blood sugar    ? dapagliflozin propanediol (FARXIGA) 5 MG TABS tablet 5 mg daily.    ? Dulaglutide 1.5 MG/0.5ML SOPN Inject 1.5 mg into the skin once a week. Sunday    ? famotidine (PEPCID) 20 MG tablet Take 20 mg by mouth 2 (two) times daily.    ?  fluticasone (FLONASE) 50 MCG/ACT nasal spray Place 2 sprays into both nostrils at bedtime as needed for allergies (before uses CPAP if needed).    ? folic acid (FOLVITE) 1 MG tablet Take 1 mg by mouth daily.    ? HUMALOG KWIKPEN 100 UNIT/ML KwikPen Inject 34 Units into the skin 2 (two) times daily.    ? hydrochlorothiazide (HYDRODIURIL) 25 MG tablet Take 1 tablet (25 mg total) by mouth daily. Please make overdue appt with Dr. Irish Lack before anymore refills. 2nd attempt 15 tablet 0  ? HYDROcodone-acetaminophen (NORCO) 5-325 MG tablet Take 1 tablet by mouth every 6 (six) hours as needed for moderate pain. 20 tablet 0  ? ibuprofen (ADVIL,MOTRIN) 200 MG tablet Take 400-800 mg by mouth daily as needed for headache.    ? insulin glargine (LANTUS) 100 UNIT/ML injection Inject 10 Units into the skin at bedtime.    ? metFORMIN (GLUCOPHAGE-XR) 500 MG 24 hr tablet Take 2,000 mg by mouth at bedtime.    ? methotrexate 2.5 MG tablet Take 15 mg by mouth every Tuesday. 6 tablets weekly    ? metoprolol succinate (TOPROL-XL) 100 MG 24 hr tablet Take 100 mg by mouth daily.     ? solifenacin (VESICARE) 5 MG tablet Take 5 mg by mouth daily.    ? tamsulosin (FLOMAX) 0.4 MG CAPS  capsule Take 0.4 mg by mouth at bedtime.  5  ? traZODone (DESYREL) 50 MG tablet Take 50 mg by mouth at bedtime as needed for sleep.    ? ?No current facility-administered medications for this visit.  ? ? ?REVIEW OF SYSTEMS:  ? ?'[X]'$  denotes positive finding, '[ ]'$  denotes negative finding ?Cardiac  Comments:  ?Chest pain or chest pressure:    ?Shortness of breath upon exertion:    ?Short of breath when lying flat:    ?Irregular heart rhythm:    ?Constitutional    ?Fever or chills:    ? ? ?PHYSICAL EXAM:  ? ?Vitals:  ? 06/02/21 1413  ?BP: 106/68  ?Pulse: 88  ?Resp: 20  ?Temp: 98.3 ?F (36.8 ?C)  ?SpO2: 97%  ?Weight: 224 lb (101.6 kg)  ?Height: 6' (1.829 m)  ? ? ?GENERAL: The patient is a well-nourished male, in no acute distress. The vital signs are documented  above. ?CARDIOVASCULAR: There is a regular rate and rhythm. ?PULMONARY: Non-labored respirations ? ? ? ?STUDIES:  ? ?none ? ? ?MEDICAL ISSUES:  ? ?I changed his dressing today and he will now do wet-to-dry twice a day to hopefully get rid of some of the slough.  Underneath this, the tissue actually looks healthy. ? ?I gave him 30 Percocet to help with his pain. ? ?He will follow-up in 3 to 4 weeks with a wound check.  This may be in the PA clinic.  If it is on a Monday, I would like to look at his wound. ? ?Annamarie Major, IV, MD, FACS ?[facility] ?Tel [phone] ?Pager [phone] ? ? ?  ?

## 2021-06-09 ENCOUNTER — Encounter (HOSPITAL_COMMUNITY): Payer: Medicare Other

## 2021-06-09 ENCOUNTER — Ambulatory Visit: Payer: Medicare Other

## 2021-06-09 DIAGNOSIS — E1151 Type 2 diabetes mellitus with diabetic peripheral angiopathy without gangrene: Secondary | ICD-10-CM | POA: Diagnosis not present

## 2021-06-09 DIAGNOSIS — K219 Gastro-esophageal reflux disease without esophagitis: Secondary | ICD-10-CM | POA: Diagnosis not present

## 2021-06-09 DIAGNOSIS — Z6829 Body mass index (BMI) 29.0-29.9, adult: Secondary | ICD-10-CM | POA: Diagnosis not present

## 2021-06-09 DIAGNOSIS — E669 Obesity, unspecified: Secondary | ICD-10-CM | POA: Diagnosis not present

## 2021-06-09 DIAGNOSIS — B966 Bacteroides fragilis [B. fragilis] as the cause of diseases classified elsewhere: Secondary | ICD-10-CM | POA: Diagnosis not present

## 2021-06-09 DIAGNOSIS — G4733 Obstructive sleep apnea (adult) (pediatric): Secondary | ICD-10-CM | POA: Diagnosis not present

## 2021-06-09 DIAGNOSIS — E876 Hypokalemia: Secondary | ICD-10-CM | POA: Diagnosis not present

## 2021-06-09 DIAGNOSIS — F419 Anxiety disorder, unspecified: Secondary | ICD-10-CM | POA: Diagnosis not present

## 2021-06-09 DIAGNOSIS — I10 Essential (primary) hypertension: Secondary | ICD-10-CM | POA: Diagnosis not present

## 2021-06-09 DIAGNOSIS — Z7985 Long-term (current) use of injectable non-insulin antidiabetic drugs: Secondary | ICD-10-CM | POA: Diagnosis not present

## 2021-06-09 DIAGNOSIS — T827XXA Infection and inflammatory reaction due to other cardiac and vascular devices, implants and grafts, initial encounter: Secondary | ICD-10-CM | POA: Diagnosis not present

## 2021-06-09 DIAGNOSIS — B9689 Other specified bacterial agents as the cause of diseases classified elsewhere: Secondary | ICD-10-CM | POA: Diagnosis not present

## 2021-06-09 DIAGNOSIS — E1165 Type 2 diabetes mellitus with hyperglycemia: Secondary | ICD-10-CM | POA: Diagnosis not present

## 2021-06-09 DIAGNOSIS — Z7902 Long term (current) use of antithrombotics/antiplatelets: Secondary | ICD-10-CM | POA: Diagnosis not present

## 2021-06-09 DIAGNOSIS — E785 Hyperlipidemia, unspecified: Secondary | ICD-10-CM | POA: Diagnosis not present

## 2021-06-09 DIAGNOSIS — D539 Nutritional anemia, unspecified: Secondary | ICD-10-CM | POA: Diagnosis not present

## 2021-06-11 DIAGNOSIS — E119 Type 2 diabetes mellitus without complications: Secondary | ICD-10-CM | POA: Diagnosis not present

## 2021-06-11 DIAGNOSIS — Z794 Long term (current) use of insulin: Secondary | ICD-10-CM | POA: Diagnosis not present

## 2021-06-11 DIAGNOSIS — E1165 Type 2 diabetes mellitus with hyperglycemia: Secondary | ICD-10-CM | POA: Diagnosis not present

## 2021-06-23 DIAGNOSIS — E1151 Type 2 diabetes mellitus with diabetic peripheral angiopathy without gangrene: Secondary | ICD-10-CM | POA: Diagnosis not present

## 2021-06-23 DIAGNOSIS — Z794 Long term (current) use of insulin: Secondary | ICD-10-CM | POA: Diagnosis not present

## 2021-06-23 DIAGNOSIS — Z8546 Personal history of malignant neoplasm of prostate: Secondary | ICD-10-CM | POA: Diagnosis not present

## 2021-06-23 DIAGNOSIS — E1165 Type 2 diabetes mellitus with hyperglycemia: Secondary | ICD-10-CM | POA: Diagnosis not present

## 2021-07-03 NOTE — Progress Notes (Signed)
?Office Note  ? ? ? ?CC:  follow up ?Requesting Provider:  Carolee Rota, NP ? ?HPI: Michael [name] is a 71 y.o. (Jun 07, 1950) male who presents for follow up wound check on right leg. He is s/p right femoral to above-knee popliteal bypass graft with saphenous vein on 04/16/2021 By Dr. Trula Slade. Unfortunately following surgery he developed a wound infection at the vein harvest site and was taken to the operating room on 05/02/2021 for debridement.  He went back to the operating room again on 05/05/2021 for further debridement and VAC placement.  Cultures from his debridements were negative. At his last visit 1 month ago he no longer had a VAC and was doing wet-to-dry dressings every other day. ? ?He returns today for wound follow up with his wife. He reports that he feels leg is doing well overall. He says it feels like it is "thawing out" as he is able to feel more now. Having some sharp "electric" like pains mostly in lower right leg but also along wounds. The more proximal incisions are more uncomfortable, especially when walking. Home Health is coming out MWF to change dressings and then his wife is doing the other dressing changes twice daily. He reports no fever or chills ? ?Reports some "electric" type pains in his head that have been happening intermittently. Often very painful but only last several seconds then go away. Has concerns about blood flow problems. Has not had any carotid evaluation  ? ?The pt is on a statin for cholesterol management.  ?The pt is not on a daily aspirin.   Other AC:  Plavix ?The pt is on CCB, HCTZ, BB for hypertension.   ?The pt is diabetic.  ?Tobacco hx:  Former, 1983 ? ?Past Medical History:  ?Diagnosis Date  ? Allergy   ? Anxiety   ? Diabetes mellitus   ? GERD (gastroesophageal reflux disease)   ? Hyperlipidemia   ? Hypertension   ? PAD (peripheral artery disease) ([place])   ? a. s/p prior LE stenting 2012, 03/2017 - followed by [facility].  ? Prostate cancer ([place])   ? Sleep apnea 2016  ?  wears CPAP sometimes  ? ? ?Past Surgical History:  ?Procedure Laterality Date  ? ABDOMINAL AORTOGRAM N/A 09/01/2016  ? Procedure: Abdominal Aortogram;  Surgeon: Serafina Mitchell, MD;  Location: [place] [facility];  Service: Cardiovascular;  Laterality: N/A;  ? ABDOMINAL AORTOGRAM W/LOWER EXTREMITY N/A 03/16/2017  ? Procedure: ABDOMINAL AORTOGRAM W/LOWER EXTREMITY;  Surgeon: Serafina Mitchell, MD;  Location: [place] [facility];  Service: Cardiovascular;  Laterality: N/A;  ? ANGIOPLASTY Right 01/30/2021  ? Procedure: BALLOON ANGIOPLASTY;  Surgeon: Serafina Mitchell, MD;  Location: [facility] OR;  Service: Vascular;  Laterality: Right;  ? ANGIOPLASTY / STENTING FEMORAL  11/05/2010  ? Left SFA stent  ? APPLICATION OF WOUND VAC Right 04/16/2021  ? Procedure: APPLICATION OF WOUND VAC;  Surgeon: Serafina Mitchell, MD;  Location: [facility] OR;  Service: Vascular;  Laterality: Right;  ? APPLICATION OF WOUND VAC Right 05/02/2021  ? Procedure: APPLICATION OF WOUND VAC;  Surgeon: Angelia Mould, MD;  Location: [facility] OR;  Service: Vascular;  Laterality: Right;  ? APPLICATION OF WOUND VAC Right 05/05/2021  ? Procedure: WOUND VAC CHANGE RIGHT GROIN, APPLICATION OF WOUND VAC PROXIMAL AND DISTAL MEDIAL RIGHT THIGH;  Surgeon: Cherre Robins, MD;  Location: [place];  Service: Vascular;  Laterality: Right;  ? ENDARTERECTOMY FEMORAL Right 04/16/2021  ? Procedure: RIGHT FEMORAL ENDARTERECTOMY;  Surgeon:  Serafina Mitchell, MD;  Location: [facility] OR;  Service: Vascular;  Laterality: Right;  ? FEMORAL-POPLITEAL BYPASS GRAFT Right 04/16/2021  ? Procedure: RIGHT FEMORAL-POPLITEAL BYPASS USING VEIN;  Surgeon: Serafina Mitchell, MD;  Location: [place];  Service: Vascular;  Laterality: Right;  ? GROIN DEBRIDEMENT Right 05/05/2021  ? Procedure: IRRIGATION AND DEBRIDEMENT RIGHT GROIN AND RIGHT THIGH;  Surgeon: Cherre Robins, MD;  Location: [facility] OR;  Service: Vascular;  Laterality: Right;  ? I & D EXTREMITY Right 05/02/2021  ? Procedure: IRRIGATION AND DEBRIDEMENT RIGHT LEG;   Surgeon: Angelia Mould, MD;  Location: [facility] OR;  Service: Vascular;  Laterality: Right;  ? KNEE ARTHROSCOPY    ? LOWER EXTREMITY ANGIOGRAM Right 01/30/2021  ? Procedure: LOWER EXTREMITY ANGIOGRAM WITH ARTERIOGRAM OF RIGHT LOWER EXTREMITY;  Surgeon: Serafina Mitchell, MD;  Location: [place];  Service: Vascular;  Laterality: Right;  ? LOWER EXTREMITY ANGIOGRAPHY N/A 09/01/2016  ? Procedure: Lower Extremity Angiography;  Surgeon: Serafina Mitchell, MD;  Location: [place] [facility];  Service: Cardiovascular;  Laterality: N/A;  ? PERIPHERAL VASCULAR ATHERECTOMY Right 09/01/2016  ? Procedure: Peripheral Vascular Atherectomy;  Surgeon: Serafina Mitchell, MD;  Location: [place] [facility];  Service: Cardiovascular;  Laterality: Right;  superficial femoral  ? PERIPHERAL VASCULAR INTERVENTION Right 03/16/2017  ? Procedure: PERIPHERAL VASCULAR INTERVENTION;  Surgeon: Serafina Mitchell, MD;  Location: [place] [facility];  Service: Cardiovascular;  Laterality: Right;  superficicial femoral  ? ROTATOR CUFF REPAIR    ? 1990  ? ROTATOR CUFF REPAIR Bilateral 2017  ? ULTRASOUND GUIDANCE FOR VASCULAR ACCESS Left 01/30/2021  ? Procedure: ULTRASOUND GUIDANCE FOR VASCULAR ACCESS;  Surgeon: Serafina Mitchell, MD;  Location: [facility] OR;  Service: Vascular;  Laterality: Left;  ? VEIN HARVEST Right 04/16/2021  ? Procedure: RIGHT GREATER SAPHENOUS VEIN HARVEST;  Surgeon: Serafina Mitchell, MD;  Location: [facility] OR;  Service: Vascular;  Laterality: Right;  ? ? ?Social History  ? ?Socioeconomic History  ? Marital status: Married  ?  Spouse name: Not on file  ? Number of children: Not on file  ? Years of education: Not on file  ? Highest education level: Not on file  ?Occupational History  ? Not on file  ?Tobacco Use  ? Smoking status: Never  ?  Passive exposure: Never  ? Smokeless tobacco: Former  ?  Quit date: 10/26/1981  ?Vaping Use  ? Vaping Use: Never used  ?Substance and Sexual Activity  ? Alcohol use: Yes  ?  Comment: occasional drink, maybe a glass  of wine once every 6 months  ? Drug use: No  ? Sexual activity: Not on file  ?Other Topics Concern  ? Not on file  ?Social History Narrative  ? Not on file  ? ?Social Determinants of Health  ? ?Financial Resource Strain: Not on file  ?Food Insecurity: Not on file  ?Transportation Needs: Not on file  ?Physical Activity: Not on file  ?Stress: Not on file  ?Social Connections: Not on file  ?Intimate Partner Violence: Not on file  ? ? ?Family History  ?Problem Relation Age of Onset  ? Hyperlipidemia Mother   ? Hypertension Mother   ? Heart disease Father   ? Deep vein thrombosis Father   ? Hyperlipidemia Father   ? Hypertension Father   ? Heart attack Father   ? Peripheral vascular disease Father   ? Cancer Sister   ? ? ?Current Outpatient Medications  ?Medication Sig Dispense Refill  ?  ACCU-CHEK AVIVA PLUS test strip AS DIRECTED SEVEN TIMES A DAY IN VITRO 90 DAYS  4  ? acetaminophen (TYLENOL) 500 MG tablet Take 1,000 mg by mouth every 6 (six) hours as needed for mild pain.    ? amLODipine (NORVASC) 10 MG tablet Take 10 mg by mouth daily.    ? atorvastatin (LIPITOR) 40 MG tablet Take 40 mg by mouth at bedtime.  5  ? benazepril (LOTENSIN) 40 MG tablet Take 40 mg by mouth daily.    ? cefadroxil (DURICEF) 500 MG capsule Take 2 capsules (1,000 mg total) by mouth 2 (two) times daily. 56 capsule 0  ? clonazePAM (KLONOPIN) 1 MG tablet Take 1 mg by mouth at bedtime as needed (sleep).    ? clopidogrel (PLAVIX) 75 MG tablet TAKE 1 TABLET BY MOUTH EVERY DAY WITH BREAKFAST (Patient taking differently: Take 75 mg by mouth daily.) 90 tablet 3  ? Continuous Blood Gluc Receiver (FREESTYLE LIBRE 14 DAY READER) DEVI use to monitor blood sugar    ? dapagliflozin propanediol (FARXIGA) 5 MG TABS tablet 5 mg daily.    ? Dulaglutide 1.5 MG/0.5ML SOPN Inject 1.5 mg into the skin once a week. Sunday    ? famotidine (PEPCID) 20 MG tablet Take 20 mg by mouth 2 (two) times daily.    ? fluticasone (FLONASE) 50 MCG/ACT nasal spray Place 2 sprays  into both nostrils at bedtime as needed for allergies (before uses CPAP if needed).    ? folic acid (FOLVITE) 1 MG tablet Take 1 mg by mouth daily.    ? HUMALOG KWIKPEN 100 UNIT/ML KwikPen Inject 34 Units into

## 2021-07-07 ENCOUNTER — Ambulatory Visit (INDEPENDENT_AMBULATORY_CARE_PROVIDER_SITE_OTHER): Payer: Medicare Other | Admitting: Physician Assistant

## 2021-07-07 VITALS — BP 90/64 | HR 85 | Temp 97.6°F | Resp 20 | Ht 72.0 in | Wt 216.4 lb

## 2021-07-07 DIAGNOSIS — I739 Peripheral vascular disease, unspecified: Secondary | ICD-10-CM

## 2021-07-07 DIAGNOSIS — I70211 Atherosclerosis of native arteries of extremities with intermittent claudication, right leg: Secondary | ICD-10-CM

## 2021-07-07 DIAGNOSIS — T8149XA Infection following a procedure, other surgical site, initial encounter: Secondary | ICD-10-CM

## 2021-07-07 MED ORDER — OXYCODONE-ACETAMINOPHEN 5-325 MG PO TABS: 1.0000 | ORAL_TABLET | Freq: Four times a day (QID) | ORAL | 0 refills | Status: DC | PRN

## 2021-07-09 DIAGNOSIS — I10 Essential (primary) hypertension: Secondary | ICD-10-CM | POA: Diagnosis not present

## 2021-07-09 DIAGNOSIS — D539 Nutritional anemia, unspecified: Secondary | ICD-10-CM | POA: Diagnosis not present

## 2021-07-09 DIAGNOSIS — B9689 Other specified bacterial agents as the cause of diseases classified elsewhere: Secondary | ICD-10-CM | POA: Diagnosis not present

## 2021-07-09 DIAGNOSIS — B966 Bacteroides fragilis [B. fragilis] as the cause of diseases classified elsewhere: Secondary | ICD-10-CM | POA: Diagnosis not present

## 2021-07-09 DIAGNOSIS — Z7902 Long term (current) use of antithrombotics/antiplatelets: Secondary | ICD-10-CM | POA: Diagnosis not present

## 2021-07-09 DIAGNOSIS — F419 Anxiety disorder, unspecified: Secondary | ICD-10-CM | POA: Diagnosis not present

## 2021-07-09 DIAGNOSIS — E876 Hypokalemia: Secondary | ICD-10-CM | POA: Diagnosis not present

## 2021-07-09 DIAGNOSIS — K219 Gastro-esophageal reflux disease without esophagitis: Secondary | ICD-10-CM | POA: Diagnosis not present

## 2021-07-09 DIAGNOSIS — T827XXD Infection and inflammatory reaction due to other cardiac and vascular devices, implants and grafts, subsequent encounter: Secondary | ICD-10-CM | POA: Diagnosis not present

## 2021-07-09 DIAGNOSIS — Z79891 Long term (current) use of opiate analgesic: Secondary | ICD-10-CM | POA: Diagnosis not present

## 2021-07-09 DIAGNOSIS — Z7985 Long-term (current) use of injectable non-insulin antidiabetic drugs: Secondary | ICD-10-CM | POA: Diagnosis not present

## 2021-07-09 DIAGNOSIS — G4733 Obstructive sleep apnea (adult) (pediatric): Secondary | ICD-10-CM | POA: Diagnosis not present

## 2021-07-09 DIAGNOSIS — E669 Obesity, unspecified: Secondary | ICD-10-CM | POA: Diagnosis not present

## 2021-07-09 DIAGNOSIS — E785 Hyperlipidemia, unspecified: Secondary | ICD-10-CM | POA: Diagnosis not present

## 2021-07-09 DIAGNOSIS — E1165 Type 2 diabetes mellitus with hyperglycemia: Secondary | ICD-10-CM | POA: Diagnosis not present

## 2021-07-09 DIAGNOSIS — E1151 Type 2 diabetes mellitus with diabetic peripheral angiopathy without gangrene: Secondary | ICD-10-CM | POA: Diagnosis not present

## 2021-07-11 ENCOUNTER — Other Ambulatory Visit: Payer: Self-pay | Admitting: *Deleted

## 2021-07-11 DIAGNOSIS — L4050 Arthropathic psoriasis, unspecified: Secondary | ICD-10-CM | POA: Diagnosis not present

## 2021-07-11 DIAGNOSIS — I6529 Occlusion and stenosis of unspecified carotid artery: Secondary | ICD-10-CM

## 2021-07-11 DIAGNOSIS — Z79899 Other long term (current) drug therapy: Secondary | ICD-10-CM | POA: Diagnosis not present

## 2021-07-11 DIAGNOSIS — L400 Psoriasis vulgaris: Secondary | ICD-10-CM | POA: Diagnosis not present

## 2021-07-11 DIAGNOSIS — C61 Malignant neoplasm of prostate: Secondary | ICD-10-CM | POA: Diagnosis not present

## 2021-07-31 DIAGNOSIS — E1122 Type 2 diabetes mellitus with diabetic chronic kidney disease: Secondary | ICD-10-CM | POA: Diagnosis not present

## 2021-07-31 DIAGNOSIS — N181 Chronic kidney disease, stage 1: Secondary | ICD-10-CM | POA: Diagnosis not present

## 2021-07-31 DIAGNOSIS — E782 Mixed hyperlipidemia: Secondary | ICD-10-CM | POA: Diagnosis not present

## 2021-07-31 DIAGNOSIS — I10 Essential (primary) hypertension: Secondary | ICD-10-CM | POA: Diagnosis not present

## 2021-08-05 ENCOUNTER — Ambulatory Visit (INDEPENDENT_AMBULATORY_CARE_PROVIDER_SITE_OTHER): Payer: Medicare Other | Admitting: Surgery

## 2021-08-05 ENCOUNTER — Encounter: Payer: Self-pay | Admitting: Surgery

## 2021-08-05 VITALS — BP 129/73 | HR 88 | Temp 98.3°F | Resp 20 | Ht 72.0 in | Wt 222.0 lb

## 2021-08-05 DIAGNOSIS — I70211 Atherosclerosis of native arteries of extremities with intermittent claudication, right leg: Secondary | ICD-10-CM

## 2021-08-05 MED ORDER — SULFAMETHOXAZOLE-TRIMETHOPRIM 800-160 MG PO TABS: 1.0000 | ORAL_TABLET | Freq: Two times a day (BID) | ORAL | 0 refills | Status: DC

## 2021-08-05 MED ORDER — HYDROMORPHONE HCL 2 MG PO TABS: 2.0000 mg | ORAL_TABLET | ORAL | 0 refills | Status: DC | PRN

## 2021-08-05 NOTE — Progress Notes (Signed)
[facility]  Patient name: Michael [name] MRN: [id number] DOB: 1951-02-08 Sex: male   REASON FOR VISIT:    Follow-up  HISOTRY OF PRESENT ILLNESS:    Michael [name] is a 71 y.o. male who is status post right femoral to above-knee popliteal bypass graft with vein on 04/06/2021 for claudication.  This was complicated by vein harvest site infection requiring debridement on 05/02/2021 and 05/05/2021.  We have been providing wound care.  He was last seen on 07/07/2021.  He is doing wet-to-dry dressing changes and has discontinued the wound VAC.  He has been complaining of pain and one of his groin incisions that is being packed   PAST MEDICAL HISTORY:   Past Medical History:  Diagnosis Date   Allergy    Anxiety    Diabetes mellitus    GERD (gastroesophageal reflux disease)    Hyperlipidemia    Hypertension    PAD (peripheral artery disease) ([facility])    a. s/p prior LE stenting 2012, 03/2017 - followed by [facility].   Prostate cancer ([place])    Sleep apnea 2016   wears CPAP sometimes     FAMILY HISTORY:   Family History  Problem Relation Age of Onset   Hyperlipidemia Mother    Hypertension Mother    Heart disease Father    Deep vein thrombosis Father    Hyperlipidemia Father    Hypertension Father    Heart attack Father    Peripheral vascular disease Father    Cancer Sister     SOCIAL HISTORY:   Social History   Tobacco Use   Smoking status: Never    Passive exposure: Never   Smokeless tobacco: Former    Quit date: 10/26/1981  Substance Use Topics   Alcohol use: Yes    Comment: occasional drink, maybe a glass of wine once every 6 months     ALLERGIES:   Allergies  Allergen Reactions   Demerol Anaphylaxis   Lactose Intolerance (Gi) Anaphylaxis   Oxycodone Anxiety and Other (See Comments)    Suicidal ideation   Penicillins Anaphylaxis and Other (See Comments)    Tolerates cefepime    Actos  [Pioglitazone] Nausea Only   Exenatide Nausea Only and Other (See Comments)   Glimepiride Other (See Comments)    Edema in ankles    Nabumetone Nausea Only and Other (See Comments)   Victoza [Liraglutide] Nausea Only    Shaking   Fentanyl Anxiety    Jittery    Hydrocodone Anxiety    Suicidal ideation     CURRENT MEDICATIONS:   Current Outpatient Medications  Medication Sig Dispense Refill   ACCU-CHEK AVIVA PLUS test strip AS DIRECTED SEVEN TIMES A DAY IN VITRO 90 DAYS  4   acetaminophen (TYLENOL) 500 MG tablet Take 1,000 mg by mouth every 6 (six) hours as needed for mild pain.     amLODipine (NORVASC) 10 MG tablet Take 10 mg by mouth daily.     atorvastatin (LIPITOR) 40 MG tablet Take 40 mg by mouth at bedtime.  5   benazepril (LOTENSIN) 40 MG tablet Take 40 mg by mouth daily.     cefadroxil (DURICEF) 500 MG capsule Take 2 capsules (1,000 mg total) by mouth 2 (two) times daily. 56 capsule 0   clonazePAM (KLONOPIN) 1 MG tablet Take 1 mg by mouth at bedtime as needed (sleep).     clopidogrel (PLAVIX) 75 MG tablet TAKE 1 TABLET BY MOUTH EVERY DAY WITH BREAKFAST (Patient  taking differently: Take 75 mg by mouth daily.) 90 tablet 3   Continuous Blood Gluc Receiver (FREESTYLE LIBRE 14 DAY READER) DEVI use to monitor blood sugar     dapagliflozin propanediol (FARXIGA) 5 MG TABS tablet 5 mg daily.     Dulaglutide 1.5 MG/0.5ML SOPN Inject 1.5 mg into the skin once a week. Sunday     famotidine (PEPCID) 20 MG tablet Take 20 mg by mouth 2 (two) times daily.     fluticasone (FLONASE) 50 MCG/ACT nasal spray Place 2 sprays into both nostrils at bedtime as needed for allergies (before uses CPAP if needed).     folic acid (FOLVITE) 1 MG tablet Take 1 mg by mouth daily.     HUMALOG KWIKPEN 100 UNIT/ML KwikPen Inject 34 Units into the skin 2 (two) times daily.     hydrochlorothiazide (HYDRODIURIL) 25 MG tablet Take 1 tablet (25 mg total) by mouth daily. Please make overdue appt with Dr. Irish Lack  before anymore refills. 2nd attempt 15 tablet 0   ibuprofen (ADVIL,MOTRIN) 200 MG tablet Take 400-800 mg by mouth daily as needed for headache.     insulin glargine (LANTUS) 100 UNIT/ML injection Inject 10 Units into the skin at bedtime.     metFORMIN (GLUCOPHAGE-XR) 500 MG 24 hr tablet Take 2,000 mg by mouth at bedtime.     methotrexate 2.5 MG tablet Take 15 mg by mouth every Tuesday. 6 tablets weekly     metoprolol succinate (TOPROL-XL) 100 MG 24 hr tablet Take 100 mg by mouth daily.      oxyCODONE-acetaminophen (PERCOCET/ROXICET) 5-325 MG tablet Take 1 tablet by mouth every 6 (six) hours as needed for severe pain. 30 tablet 0   solifenacin (VESICARE) 5 MG tablet Take 5 mg by mouth daily.     tamsulosin (FLOMAX) 0.4 MG CAPS capsule Take 0.4 mg by mouth at bedtime.  5   traZODone (DESYREL) 50 MG tablet Take 50 mg by mouth at bedtime as needed for sleep.     No current facility-administered medications for this visit.    REVIEW OF SYSTEMS:   '[X]'$  denotes positive finding, '[ ]'$  denotes negative finding Cardiac  Comments:  Chest pain or chest pressure:    Shortness of breath upon exertion:    Short of breath when lying flat:    Irregular heart rhythm:        Vascular    Pain in calf, thigh, or hip brought on by ambulation:    Pain in feet at night that wakes you up from your sleep:     Blood clot in your veins:    Leg swelling:         Pulmonary    Oxygen at home:    Productive cough:     Wheezing:         Neurologic    Sudden weakness in arms or legs:     Sudden numbness in arms or legs:     Sudden onset of difficulty speaking or slurred speech:    Temporary loss of vision in one eye:     Problems with dizziness:         Gastrointestinal    Blood in stool:     Vomited blood:         Genitourinary    Burning when urinating:     Blood in urine:        Psychiatric    Major depression:         Hematologic  Bleeding problems:    Problems with blood clotting too easily:         Skin    Rashes or ulcers:        Constitutional    Fever or chills:      PHYSICAL EXAM:   Vitals:   08/05/21 1422  BP: 129/73  Pulse: 88  Resp: 20  Temp: 98.3 F (36.8 C)  SpO2: 100%  Weight: 222 lb (100.7 kg)  Height: 6' (1.829 m)    GENERAL: The patient is a well-nourished male, in no acute distress. The vital signs are documented above. CARDIAC: There is a regular rate and rhythm.  VASCULAR: I debrided part of the groin incision and remove some necrotic tissue.  This did not appear to tunnel anywhere.  It was tender to the touch.  There is no significant surrounding erythema.  There was a slight foul odor.  Triphasic posterior tibial Doppler signal and biphasic dorsalis pedis PULMONARY: Non-labored respirations ABDOMEN: Soft and non-tender with normal pitched bowel sounds.  MUSCULOSKELETAL: There are no major deformities or cyanosis. NEUROLOGIC: No focal weakness or paresthesias are detected. SKIN: There are no ulcers or rashes noted. PSYCHIATRIC: The patient has a normal affect.  STUDIES:   None  MEDICAL ISSUES:   -Continue wet-to-dry dressing changes.  The patient is going to add honey. -I called in Bactrim twice daily for 2 weeks for possible skin infection -I discussed possibility that if this is not better he may require an exam under anesthesia to make sure this does not track anywhere -He has follow-up in 5 days with carotid Dopplers -Dilaudid 2 mg was prescribed, 20 tablets for pain control    Leia Alf, MD, FACS [facility] [phone] Pager [phone]

## 2021-08-07 ENCOUNTER — Ambulatory Visit: Payer: Medicare Other

## 2021-08-07 ENCOUNTER — Encounter (HOSPITAL_COMMUNITY): Payer: Medicare Other

## 2021-08-08 DIAGNOSIS — E876 Hypokalemia: Secondary | ICD-10-CM | POA: Diagnosis not present

## 2021-08-08 DIAGNOSIS — Z7985 Long-term (current) use of injectable non-insulin antidiabetic drugs: Secondary | ICD-10-CM | POA: Diagnosis not present

## 2021-08-08 DIAGNOSIS — E785 Hyperlipidemia, unspecified: Secondary | ICD-10-CM | POA: Diagnosis not present

## 2021-08-08 DIAGNOSIS — T827XXD Infection and inflammatory reaction due to other cardiac and vascular devices, implants and grafts, subsequent encounter: Secondary | ICD-10-CM | POA: Diagnosis not present

## 2021-08-08 DIAGNOSIS — K219 Gastro-esophageal reflux disease without esophagitis: Secondary | ICD-10-CM | POA: Diagnosis not present

## 2021-08-08 DIAGNOSIS — E1165 Type 2 diabetes mellitus with hyperglycemia: Secondary | ICD-10-CM | POA: Diagnosis not present

## 2021-08-08 DIAGNOSIS — F419 Anxiety disorder, unspecified: Secondary | ICD-10-CM | POA: Diagnosis not present

## 2021-08-08 DIAGNOSIS — E1151 Type 2 diabetes mellitus with diabetic peripheral angiopathy without gangrene: Secondary | ICD-10-CM | POA: Diagnosis not present

## 2021-08-08 DIAGNOSIS — E669 Obesity, unspecified: Secondary | ICD-10-CM | POA: Diagnosis not present

## 2021-08-08 DIAGNOSIS — Z79891 Long term (current) use of opiate analgesic: Secondary | ICD-10-CM | POA: Diagnosis not present

## 2021-08-08 DIAGNOSIS — G4733 Obstructive sleep apnea (adult) (pediatric): Secondary | ICD-10-CM | POA: Diagnosis not present

## 2021-08-08 DIAGNOSIS — B9689 Other specified bacterial agents as the cause of diseases classified elsewhere: Secondary | ICD-10-CM | POA: Diagnosis not present

## 2021-08-08 DIAGNOSIS — I10 Essential (primary) hypertension: Secondary | ICD-10-CM | POA: Diagnosis not present

## 2021-08-08 DIAGNOSIS — Z7902 Long term (current) use of antithrombotics/antiplatelets: Secondary | ICD-10-CM | POA: Diagnosis not present

## 2021-08-08 DIAGNOSIS — B966 Bacteroides fragilis [B. fragilis] as the cause of diseases classified elsewhere: Secondary | ICD-10-CM | POA: Diagnosis not present

## 2021-08-08 DIAGNOSIS — D539 Nutritional anemia, unspecified: Secondary | ICD-10-CM | POA: Diagnosis not present

## 2021-08-11 ENCOUNTER — Ambulatory Visit (INDEPENDENT_AMBULATORY_CARE_PROVIDER_SITE_OTHER): Payer: Medicare Other | Admitting: Physician Assistant

## 2021-08-11 ENCOUNTER — Ambulatory Visit (HOSPITAL_COMMUNITY)
Admission: RE | Admit: 2021-08-11 | Discharge: 2021-08-11 | Disposition: A | Discharge status: Home / Self Care | Payer: Medicare Other | Source: Ambulatory Visit | Attending: Surgery | Admitting: Surgery

## 2021-08-11 VITALS — BP 124/72 | HR 97 | Temp 98.0°F | Resp 20 | Ht 72.0 in | Wt 221.6 lb

## 2021-08-11 DIAGNOSIS — E1151 Type 2 diabetes mellitus with diabetic peripheral angiopathy without gangrene: Secondary | ICD-10-CM | POA: Diagnosis not present

## 2021-08-11 DIAGNOSIS — I6529 Occlusion and stenosis of unspecified carotid artery: Secondary | ICD-10-CM | POA: Diagnosis not present

## 2021-08-11 DIAGNOSIS — I70211 Atherosclerosis of native arteries of extremities with intermittent claudication, right leg: Secondary | ICD-10-CM | POA: Diagnosis not present

## 2021-08-11 DIAGNOSIS — B351 Tinea unguium: Secondary | ICD-10-CM | POA: Diagnosis not present

## 2021-08-11 DIAGNOSIS — L851 Acquired keratosis [keratoderma] palmaris et plantaris: Secondary | ICD-10-CM | POA: Diagnosis not present

## 2021-08-11 NOTE — Progress Notes (Signed)
Office Note     CC:  follow up Requesting Provider:  Carolee Rota, NP  HPI: Michael [name] is a 71 y.o. (05/23/1950) male who presents status post right femoral to above-the-knee popliteal bypass graft with vein on 04/06/2021 for claudication.  This was complicated by vein harvest site infection requiring debridement on 3/3 and 05/05/2021.  Patient's wife has been providing wound care in addition to home health RN.  He was last seen last week by Dr. Trula Slade and he continues to use wet-to-dry dressing changes.  He is also taking Bactrim since last office visit.  He denies any fevers or chills.  Patient has been having headaches and hearing is heartbeat in his ear.  He has never been scanned for carotid artery stenosis.  Past Medical History:  Diagnosis Date   Allergy    Anxiety    Diabetes mellitus    GERD (gastroesophageal reflux disease)    Hyperlipidemia    Hypertension    PAD (peripheral artery disease) ([place])    a. s/p prior LE stenting 2012, 03/2017 - followed by [facility].   Prostate cancer ([facility])    Sleep apnea 2016   wears CPAP sometimes    Past Surgical History:  Procedure Laterality Date   ABDOMINAL AORTOGRAM N/A 09/01/2016   Procedure: Abdominal Aortogram;  Surgeon: Serafina Mitchell, MD;  Location: [place] [facility];  Service: Cardiovascular;  Laterality: N/A;   ABDOMINAL AORTOGRAM W/LOWER EXTREMITY N/A 03/16/2017   Procedure: ABDOMINAL AORTOGRAM W/LOWER EXTREMITY;  Surgeon: Serafina Mitchell, MD;  Location: [place] [facility];  Service: Cardiovascular;  Laterality: N/A;   ANGIOPLASTY Right 01/30/2021   Procedure: BALLOON ANGIOPLASTY;  Surgeon: Serafina Mitchell, MD;  Location: [place];  Service: Vascular;  Laterality: Right;   ANGIOPLASTY / STENTING FEMORAL  11/05/2010   Left SFA stent   APPLICATION OF WOUND VAC Right 04/16/2021   Procedure: APPLICATION OF WOUND VAC;  Surgeon: Serafina Mitchell, MD;  Location: [place];  Service: Vascular;  Laterality: Right;   APPLICATION OF WOUND VAC  Right 05/02/2021   Procedure: APPLICATION OF WOUND VAC;  Surgeon: Angelia Mould, MD;  Location: [place];  Service: Vascular;  Laterality: Right;   APPLICATION OF WOUND VAC Right 05/05/2021   Procedure: WOUND VAC CHANGE RIGHT GROIN, APPLICATION OF WOUND VAC PROXIMAL AND DISTAL MEDIAL RIGHT THIGH;  Surgeon: Cherre Robins, MD;  Location: [place];  Service: Vascular;  Laterality: Right;   ENDARTERECTOMY FEMORAL Right 04/16/2021   Procedure: RIGHT FEMORAL ENDARTERECTOMY;  Surgeon: Serafina Mitchell, MD;  Location: [place];  Service: Vascular;  Laterality: Right;   FEMORAL-POPLITEAL BYPASS GRAFT Right 04/16/2021   Procedure: RIGHT FEMORAL-POPLITEAL BYPASS USING VEIN;  Surgeon: Serafina Mitchell, MD;  Location: [place];  Service: Vascular;  Laterality: Right;   GROIN DEBRIDEMENT Right 05/05/2021   Procedure: IRRIGATION AND DEBRIDEMENT RIGHT GROIN AND RIGHT THIGH;  Surgeon: Cherre Robins, MD;  Location: [place];  Service: Vascular;  Laterality: Right;   I & D EXTREMITY Right 05/02/2021   Procedure: IRRIGATION AND DEBRIDEMENT RIGHT LEG;  Surgeon: Angelia Mould, MD;  Location: [facility];  Service: Vascular;  Laterality: Right;   KNEE ARTHROSCOPY     LOWER EXTREMITY ANGIOGRAM Right 01/30/2021   Procedure: LOWER EXTREMITY ANGIOGRAM WITH ARTERIOGRAM OF RIGHT LOWER EXTREMITY;  Surgeon: Serafina Mitchell, MD;  [facility];  Service: Vascular;  Laterality: Right;   LOWER EXTREMITY ANGIOGRAPHY N/A 09/01/2016   Procedure: Lower Extremity Angiography;  Surgeon: Trula Slade,  Butch Penny, MD;  Location: [place] [facility];  Service: Cardiovascular;  Laterality: N/A;   PERIPHERAL VASCULAR ATHERECTOMY Right 09/01/2016   Procedure: Peripheral Vascular Atherectomy;  Surgeon: Serafina Mitchell, MD;  Location: [place] [facility];  Service: Cardiovascular;  Laterality: Right;  superficial femoral   PERIPHERAL VASCULAR INTERVENTION Right 03/16/2017   Procedure: PERIPHERAL VASCULAR INTERVENTION;  Surgeon: Serafina Mitchell, MD;   Location: [place] [facility];  Service: Cardiovascular;  Laterality: Right;  superficicial femoral   ROTATOR CUFF REPAIR     1990   ROTATOR CUFF REPAIR Bilateral 2017   ULTRASOUND GUIDANCE FOR VASCULAR ACCESS Left 01/30/2021   Procedure: ULTRASOUND GUIDANCE FOR VASCULAR ACCESS;  Surgeon: Serafina Mitchell, MD;  [facility];  Service: Vascular;  Laterality: Left;   VEIN HARVEST Right 04/16/2021   Procedure: [place];  Surgeon: Serafina Mitchell, MD;  [facility];  Service: Vascular;  Laterality: Right;    Social History   Socioeconomic History   Marital status: Married    Spouse name: Not on file   Number of children: Not on file   Years of education: Not on file   Highest education level: Not on file  Occupational History   Not on file  Tobacco Use   Smoking status: Never    Passive exposure: Never   Smokeless tobacco: Former    Quit date: 10/26/1981  Vaping Use   Vaping Use: Never used  Substance and Sexual Activity   Alcohol use: Yes    Comment: occasional drink, maybe a glass of wine once every 6 months   Drug use: No   Sexual activity: Not on file  Other Topics Concern   Not on file  Social History Narrative   Not on file   Social Determinants of Health   Financial Resource Strain: Not on file  Food Insecurity: Not on file  Transportation Needs: Not on file  Physical Activity: Not on file  Stress: Not on file  Social Connections: Not on file  Intimate Partner Violence: Not on file    Family History  Problem Relation Age of Onset   Hyperlipidemia Mother    Hypertension Mother    Heart disease Father    Deep vein thrombosis Father    Hyperlipidemia Father    Hypertension Father    Heart attack Father    Peripheral vascular disease Father    Cancer Sister     Current Outpatient Medications  Medication Sig Dispense Refill   ACCU-CHEK AVIVA PLUS test strip AS DIRECTED SEVEN TIMES A DAY IN VITRO 90 DAYS  4   acetaminophen  (TYLENOL) 500 MG tablet Take 1,000 mg by mouth every 6 (six) hours as needed for mild pain.     amLODipine (NORVASC) 10 MG tablet Take 10 mg by mouth daily.     atorvastatin (LIPITOR) 40 MG tablet Take 40 mg by mouth at bedtime.  5   benazepril (LOTENSIN) 40 MG tablet Take 40 mg by mouth daily.     cefadroxil (DURICEF) 500 MG capsule Take 2 capsules (1,000 mg total) by mouth 2 (two) times daily. 56 capsule 0   clonazePAM (KLONOPIN) 1 MG tablet Take 1 mg by mouth at bedtime as needed (sleep).     clopidogrel (PLAVIX) 75 MG tablet TAKE 1 TABLET BY MOUTH EVERY DAY WITH BREAKFAST (Patient taking differently: Take 75 mg by mouth daily.) 90 tablet 3   Continuous Blood Gluc Receiver (FREESTYLE LIBRE 14 DAY READER) DEVI  use to monitor blood sugar     dapagliflozin propanediol (FARXIGA) 5 MG TABS tablet 5 mg daily.     Dulaglutide 1.5 MG/0.5ML SOPN Inject 1.5 mg into the skin once a week. Sunday     famotidine (PEPCID) 20 MG tablet Take 20 mg by mouth 2 (two) times daily.     fluticasone (FLONASE) 50 MCG/ACT nasal spray Place 2 sprays into both nostrils at bedtime as needed for allergies (before uses CPAP if needed).     folic acid (FOLVITE) 1 MG tablet Take 1 mg by mouth daily.     HUMALOG KWIKPEN 100 UNIT/ML KwikPen Inject 34 Units into the skin 2 (two) times daily.     hydrochlorothiazide (HYDRODIURIL) 25 MG tablet Take 1 tablet (25 mg total) by mouth daily. Please make overdue appt with Dr. Irish Lack before anymore refills. 2nd attempt 15 tablet 0   HYDROmorphone (DILAUDID) 2 MG tablet Take 1 tablet (2 mg total) by mouth every 4 (four) hours as needed for severe pain. 20 tablet 0   ibuprofen (ADVIL,MOTRIN) 200 MG tablet Take 400-800 mg by mouth daily as needed for headache.     insulin glargine (LANTUS) 100 UNIT/ML injection Inject 10 Units into the skin at bedtime.     metFORMIN (GLUCOPHAGE-XR) 500 MG 24 hr tablet Take 2,000 mg by mouth at bedtime.     methotrexate 2.5 MG tablet Take 15 mg by mouth  every Tuesday. 6 tablets weekly     metoprolol succinate (TOPROL-XL) 100 MG 24 hr tablet Take 100 mg by mouth daily.      oxyCODONE-acetaminophen (PERCOCET/ROXICET) 5-325 MG tablet Take 1 tablet by mouth every 6 (six) hours as needed for severe pain. 30 tablet 0   solifenacin (VESICARE) 5 MG tablet Take 5 mg by mouth daily.     sulfamethoxazole-trimethoprim (BACTRIM DS) 800-160 MG tablet Take 1 tablet by mouth 2 (two) times daily. 28 tablet 0   tamsulosin (FLOMAX) 0.4 MG CAPS capsule Take 0.4 mg by mouth at bedtime.  5   traZODone (DESYREL) 50 MG tablet Take 50 mg by mouth at bedtime as needed for sleep.     No current facility-administered medications for this visit.    Allergies  Allergen Reactions   Demerol Anaphylaxis   Lactose Intolerance (Gi) Anaphylaxis   Oxycodone Anxiety and Other (See Comments)    Suicidal ideation   Penicillins Anaphylaxis and Other (See Comments)    Tolerates cefepime    Actos [Pioglitazone] Nausea Only   Exenatide Nausea Only and Other (See Comments)   Glimepiride Other (See Comments)    Edema in ankles    Nabumetone Nausea Only and Other (See Comments)   Victoza [Liraglutide] Nausea Only    Shaking   Fentanyl Anxiety    Jittery    Hydrocodone Anxiety    Suicidal ideation     REVIEW OF SYSTEMS:   '[X]'$  denotes positive finding, '[ ]'$  denotes negative finding Cardiac  Comments:  Chest pain or chest pressure:    Shortness of breath upon exertion:    Short of breath when lying flat:    Irregular heart rhythm:        Vascular    Pain in calf, thigh, or hip brought on by ambulation:    Pain in feet at night that wakes you up from your sleep:     Blood clot in your veins:    Leg swelling:         Pulmonary    Oxygen at home:  Productive cough:     Wheezing:         Neurologic    Sudden weakness in arms or legs:     Sudden numbness in arms or legs:     Sudden onset of difficulty speaking or slurred speech:    Temporary loss of vision in  one eye:     Problems with dizziness:         Gastrointestinal    Blood in stool:     Vomited blood:         Genitourinary    Burning when urinating:     Blood in urine:        Psychiatric    Major depression:         Hematologic    Bleeding problems:    Problems with blood clotting too easily:        Skin    Rashes or ulcers:        Constitutional    Fever or chills:      PHYSICAL EXAMINATION:  Vitals:   08/11/21 1217 08/11/21 1224  BP: 135/69 124/72  Pulse: 97   Resp: 20   Temp: 98 F (36.7 C)   TempSrc: Temporal   SpO2: 97%   Weight: 221 lb 9.6 oz (100.5 kg)   Height: 6' (1.829 m)     General:  WDWN in NAD; vital signs documented above Gait: Not observed HENT: WNL, normocephalic Pulmonary: normal non-labored breathing , without Rales, rhonchi,  wheezing Cardiac: regular HR Abdomen: soft, NT, no masses Skin: without rashes Extremities: Some fibrinous exudate in proximal groin incision however no tunneling or visible artery or bypass; right distal thigh wound with a healthy appearing wound bed and no purulence Musculoskeletal: no muscle wasting or atrophy  Neurologic: A&O X 3;  No focal weakness or paresthesias are detected Psychiatric:  The pt has Normal affect.   ASSESSMENT/PLAN:: 71 y.o. male here for follow up for carotid ultrasound as well as wound check  -Carotid duplex demonstrates 1 to 39% stenosis of bilateral internal carotid arteries.  No indication for repeating this study -Right groin packing dressing changed today.  We also changed the dressing over the distal thigh.  Both wounds appear healthy and no sign of infection is noted.  I am unable to visualize any tunneling in the right groin that would lead back to the bypass or femoral artery.  Continue Bactrim as prescribed.  Patient will follow-up in 2 weeks for wound check.  If proximalmost wound improves, he can be followed less frequently   Dagoberto Ligas, PA-C [facility] [phone]  Clinic MD:   Trula Slade

## 2021-08-25 ENCOUNTER — Other Ambulatory Visit (HOSPITAL_COMMUNITY): Payer: Self-pay

## 2021-08-25 ENCOUNTER — Encounter: Payer: Self-pay | Admitting: Surgery

## 2021-08-25 ENCOUNTER — Ambulatory Visit (INDEPENDENT_AMBULATORY_CARE_PROVIDER_SITE_OTHER): Payer: Medicare Other | Admitting: Surgery

## 2021-08-25 VITALS — BP 149/78 | HR 99 | Temp 97.2°F | Resp 18 | Ht 72.0 in | Wt 220.0 lb

## 2021-08-25 DIAGNOSIS — I70211 Atherosclerosis of native arteries of extremities with intermittent claudication, right leg: Secondary | ICD-10-CM

## 2021-08-25 MED ORDER — HYDROMORPHONE HCL 2 MG PO TABS
2.0000 mg | ORAL_TABLET | ORAL | 0 refills | Status: DC | PRN
Start: 2021-08-25 — End: 2021-10-06

## 2021-08-25 MED ORDER — HYDROMORPHONE HCL 2 MG PO TABS
2.0000 mg | ORAL_TABLET | ORAL | 0 refills | Status: DC | PRN
  Filled 2021-08-25: qty 40, 7d supply, fill #0

## 2021-08-29 ENCOUNTER — Other Ambulatory Visit: Payer: Self-pay

## 2021-08-29 DIAGNOSIS — I739 Peripheral vascular disease, unspecified: Secondary | ICD-10-CM

## 2021-09-03 DIAGNOSIS — Z794 Long term (current) use of insulin: Secondary | ICD-10-CM | POA: Diagnosis not present

## 2021-09-03 DIAGNOSIS — E119 Type 2 diabetes mellitus without complications: Secondary | ICD-10-CM | POA: Diagnosis not present

## 2021-09-03 DIAGNOSIS — E1165 Type 2 diabetes mellitus with hyperglycemia: Secondary | ICD-10-CM | POA: Diagnosis not present

## 2021-09-15 ENCOUNTER — Other Ambulatory Visit: Payer: Self-pay | Admitting: *Deleted

## 2021-09-15 DIAGNOSIS — I739 Peripheral vascular disease, unspecified: Secondary | ICD-10-CM

## 2021-09-15 DIAGNOSIS — I70211 Atherosclerosis of native arteries of extremities with intermittent claudication, right leg: Secondary | ICD-10-CM

## 2021-09-23 DIAGNOSIS — Z794 Long term (current) use of insulin: Secondary | ICD-10-CM | POA: Diagnosis not present

## 2021-09-23 DIAGNOSIS — Z8546 Personal history of malignant neoplasm of prostate: Secondary | ICD-10-CM | POA: Diagnosis not present

## 2021-09-23 DIAGNOSIS — E1151 Type 2 diabetes mellitus with diabetic peripheral angiopathy without gangrene: Secondary | ICD-10-CM | POA: Diagnosis not present

## 2021-10-03 DIAGNOSIS — E119 Type 2 diabetes mellitus without complications: Secondary | ICD-10-CM | POA: Diagnosis not present

## 2021-10-03 DIAGNOSIS — E1165 Type 2 diabetes mellitus with hyperglycemia: Secondary | ICD-10-CM | POA: Diagnosis not present

## 2021-10-03 DIAGNOSIS — Z794 Long term (current) use of insulin: Secondary | ICD-10-CM | POA: Diagnosis not present

## 2021-10-06 ENCOUNTER — Ambulatory Visit (INDEPENDENT_AMBULATORY_CARE_PROVIDER_SITE_OTHER)
Admission: RE | Admit: 2021-10-06 | Discharge: 2021-10-06 | Disposition: A | Discharge status: Home / Self Care | Payer: Medicare Other | Source: Ambulatory Visit | Attending: Surgery | Admitting: Surgery

## 2021-10-06 ENCOUNTER — Other Ambulatory Visit: Payer: Self-pay

## 2021-10-06 ENCOUNTER — Other Ambulatory Visit (HOSPITAL_COMMUNITY): Payer: Self-pay

## 2021-10-06 ENCOUNTER — Ambulatory Visit: Payer: Medicare Other | Admitting: Surgery

## 2021-10-06 ENCOUNTER — Ambulatory Visit (HOSPITAL_COMMUNITY)
Admission: RE | Admit: 2021-10-06 | Discharge: 2021-10-06 | Disposition: A | Discharge status: Home / Self Care | Payer: Medicare Other | Source: Ambulatory Visit | Attending: Surgery | Admitting: Surgery

## 2021-10-06 ENCOUNTER — Encounter: Payer: Self-pay | Admitting: Surgery

## 2021-10-06 VITALS — BP 123/71 | HR 81 | Temp 98.0°F | Resp 20 | Ht 72.0 in | Wt 222.0 lb

## 2021-10-06 DIAGNOSIS — I70213 Atherosclerosis of native arteries of extremities with intermittent claudication, bilateral legs: Secondary | ICD-10-CM

## 2021-10-06 DIAGNOSIS — I739 Peripheral vascular disease, unspecified: Secondary | ICD-10-CM | POA: Insufficient documentation

## 2021-10-06 DIAGNOSIS — I70211 Atherosclerosis of native arteries of extremities with intermittent claudication, right leg: Secondary | ICD-10-CM | POA: Insufficient documentation

## 2021-10-06 MED ORDER — HYDROMORPHONE HCL 2 MG PO TABS
2.0000 mg | ORAL_TABLET | ORAL | 0 refills | Status: DC | PRN
  Filled 2021-10-06: qty 40, 7d supply, fill #0

## 2021-10-06 NOTE — Progress Notes (Signed)
[facility] [place]  Patient name: Michael [name] MRN: [id number] DOB: 09/28/1950 Sex: male   REASON FOR VISIT:    Follow up  HISOTRY OF PRESENT ILLNESS:   Michael [name] is a 71 y.o. male who is status post right femoral to above-knee popliteal bypass graft with vein on 04/06/2021 for claudication.  This was complicated by vein harvest site infection requiring debridement on 05/02/2021 and 05/05/2021.  We have been providing wound care.  He was last seen on 07/07/2021.  He is doing wet-to-dry dressing changes and has discontinued the wound VAC.     PAST MEDICAL HISTORY:   Past Medical History:  Diagnosis Date   Allergy    Anxiety    Diabetes mellitus    GERD (gastroesophageal reflux disease)    Hyperlipidemia    Hypertension    PAD (peripheral artery disease) ([facility])    a. s/p prior LE stenting 2012, 03/2017 - followed by [facility].   Prostate cancer ([place])    Sleep apnea 2016   wears CPAP sometimes     FAMILY HISTORY:   Family History  Problem Relation Age of Onset   Hyperlipidemia Mother    Hypertension Mother    Heart disease Father    Deep vein thrombosis Father    Hyperlipidemia Father    Hypertension Father    Heart attack Father    Peripheral vascular disease Father    Cancer Sister     SOCIAL HISTORY:   Social History   Tobacco Use   Smoking status: Never    Passive exposure: Never   Smokeless tobacco: Former    Quit date: 10/26/1981  Substance Use Topics   Alcohol use: Yes    Comment: occasional drink, maybe a glass of wine once every 6 months     ALLERGIES:   Allergies  Allergen Reactions   Demerol Anaphylaxis   Lactose Intolerance (Gi) Anaphylaxis   Oxycodone Anxiety and Other (See Comments)    Suicidal ideation   Penicillins Anaphylaxis and Other (See Comments)    Tolerates cefepime    Actos [Pioglitazone] Nausea Only   Exenatide Nausea Only and Other (See Comments)   Glimepiride Other  (See Comments)    Edema in ankles    Nabumetone Nausea Only and Other (See Comments)   Victoza [Liraglutide] Nausea Only    Shaking   Fentanyl Anxiety    Jittery    Hydrocodone Anxiety    Suicidal ideation     CURRENT MEDICATIONS:   Current Outpatient Medications  Medication Sig Dispense Refill   ACCU-CHEK AVIVA PLUS test strip AS DIRECTED SEVEN TIMES A DAY IN VITRO 90 DAYS  4   acetaminophen (TYLENOL) 500 MG tablet Take 1,000 mg by mouth every 6 (six) hours as needed for mild pain.     amLODipine (NORVASC) 10 MG tablet Take 10 mg by mouth daily.     atorvastatin (LIPITOR) 40 MG tablet Take 40 mg by mouth at bedtime.  5   benazepril (LOTENSIN) 40 MG tablet Take 40 mg by mouth daily.     clonazePAM (KLONOPIN) 1 MG tablet Take 1 mg by mouth at bedtime as needed (sleep).     clopidogrel (PLAVIX) 75 MG tablet TAKE 1 TABLET BY MOUTH EVERY DAY WITH BREAKFAST (Patient taking differently: Take 75 mg by mouth daily.) 90 tablet 3   Continuous Blood Gluc Receiver (FREESTYLE LIBRE 14 DAY READER) DEVI use to monitor blood sugar     dapagliflozin propanediol (FARXIGA) 5 MG  TABS tablet 5 mg daily.     Dulaglutide 1.5 MG/0.5ML SOPN Inject 1.5 mg into the skin once a week. Sunday     famotidine (PEPCID) 20 MG tablet Take 20 mg by mouth 2 (two) times daily.     fluticasone (FLONASE) 50 MCG/ACT nasal spray Place 2 sprays into both nostrils at bedtime as needed for allergies (before uses CPAP if needed).     folic acid (FOLVITE) 1 MG tablet Take 1 mg by mouth daily.     HUMALOG KWIKPEN 100 UNIT/ML KwikPen Inject 34 Units into the skin 2 (two) times daily.     hydrochlorothiazide (HYDRODIURIL) 25 MG tablet Take 1 tablet (25 mg total) by mouth daily. Please make overdue appt with Dr. Irish Lack before anymore refills. 2nd attempt 15 tablet 0   HYDROmorphone (DILAUDID) 2 MG tablet Take 1 tablet (2 mg total) by mouth every 4 (four) hours as needed for severe pain. 40 tablet 0   ibuprofen (ADVIL,MOTRIN) 200 MG  tablet Take 400-800 mg by mouth daily as needed for headache.     insulin glargine (LANTUS) 100 UNIT/ML injection Inject 10 Units into the skin at bedtime.     metFORMIN (GLUCOPHAGE-XR) 500 MG 24 hr tablet Take 2,000 mg by mouth at bedtime.     methotrexate 2.5 MG tablet Take 15 mg by mouth every Tuesday. 6 tablets weekly     metoprolol succinate (TOPROL-XL) 100 MG 24 hr tablet Take 100 mg by mouth daily.      oxyCODONE-acetaminophen (PERCOCET/ROXICET) 5-325 MG tablet Take 1 tablet by mouth every 6 (six) hours as needed for severe pain. 30 tablet 0   solifenacin (VESICARE) 5 MG tablet Take 5 mg by mouth daily.     sulfamethoxazole-trimethoprim (BACTRIM DS) 800-160 MG tablet Take 1 tablet by mouth 2 (two) times daily. 28 tablet 0   tamsulosin (FLOMAX) 0.4 MG CAPS capsule Take 0.4 mg by mouth at bedtime.  5   traZODone (DESYREL) 50 MG tablet Take 50 mg by mouth at bedtime as needed for sleep.     cefadroxil (DURICEF) 500 MG capsule Take 2 capsules (1,000 mg total) by mouth 2 (two) times daily. (Patient not taking: Reported on 08/25/2021) 56 capsule 0   No current facility-administered medications for this visit.    REVIEW OF SYSTEMS:   '[X]'$  denotes positive finding, '[ ]'$  denotes negative finding Cardiac  Comments:  Chest pain or chest pressure:    Shortness of breath upon exertion:    Short of breath when lying flat:    Irregular heart rhythm:        Vascular    Pain in calf, thigh, or hip brought on by ambulation:    Pain in feet at night that wakes you up from your sleep:     Blood clot in your veins:    Leg swelling:         Pulmonary    Oxygen at home:    Productive cough:     Wheezing:         Neurologic    Sudden weakness in arms or legs:     Sudden numbness in arms or legs:     Sudden onset of difficulty speaking or slurred speech:    Temporary loss of vision in one eye:     Problems with dizziness:         Gastrointestinal    Blood in stool:     Vomited blood:  Genitourinary    Burning when urinating:     Blood in urine:        Psychiatric    Major depression:         Hematologic    Bleeding problems:    Problems with blood clotting too easily:        Skin    Rashes or ulcers:        Constitutional    Fever or chills:      PHYSICAL EXAM:   There were no vitals filed for this visit.  GENERAL: The patient is a well-nourished male, in no acute distress. The vital signs are documented above. CARDIAC: There is a regular rate and rhythm.  VASCULAR: Mild biofilm over top of right medial thigh vein harvest incision that is healing nicely PULMONARY: Non-labored respirations MUSCULOSKELETAL: There are no major deformities or cyanosis. NEUROLOGIC: No focal weakness or paresthesias are detected. SKIN: There are no ulcers or rashes noted. PSYCHIATRIC: The patient has a normal affect.  STUDIES:   I have reviewed the following: Right Carotid: Velocities in the right ICA are consistent with a 1-39%  stenosis.   Left Carotid: Velocities in the left ICA are consistent with a 1-39%  stenosis.   Vertebrals:  Bilateral vertebral arteries demonstrate antegrade flow.  Subclavians: Normal flow hemodynamics were seen in bilateral subclavian               arteries.  ABI/TBIToday's ABIToday's TBIPrevious ABIPrevious TBI  +-------+-----------+-----------+------------+------------+  Right  0.90       0.63       0.61        0.34          +-------+-----------+-----------+------------+------------+  Left   1.19       0.97       1.17        0.95          +-------+-----------+-----------+------------+------------+  Right toe pressure:  105 Left toe pressure:  163     Right: Patent bypass graft with elevated velocities observed in the distal  segment (>70%), anastomosis (50-70%) and native outflow artery (50-74%).  Category of stenosis based on velocity criteria, disease is not well  appreciated in the distal anastomosis or native  outflow artery; however,  the distal segment of the bypass (approximately 0.8 cm from the  anastomosis) appears mildly narrowed.   MEDICAL ISSUES:   I discussed the ultrasound results with the patient which indicate distal anastomotic stenosis.  I have recommended that we proceed with angiography via left femoral approach with right leg intervention likely at the distal femoral-popliteal bypass anastomosis.  Simultaneously I will plan on sharply debriding the wound and taking cultures as there does appear to be a biofilm over top.  This will be scheduled in the near future.    Leia Alf, MD, FACS [facility] [phone] Pager [phone]

## 2021-10-06 NOTE — H&P (View-Only) (Signed)
[facility] [place]  Patient name: Michael [name] MRN: [id number] DOB: 01-18-1951 Sex: male   REASON FOR VISIT:    Follow up  HISOTRY OF PRESENT ILLNESS:   Michael [name] is a 71 y.o. male who is status post right femoral to above-knee popliteal bypass graft with vein on 04/06/2021 for claudication.  This was complicated by vein harvest site infection requiring debridement on 05/02/2021 and 05/05/2021.  We have been providing wound care.  He was last seen on 07/07/2021.  He is doing wet-to-dry dressing changes and has discontinued the wound VAC.     PAST MEDICAL HISTORY:   Past Medical History:  Diagnosis Date   Allergy    Anxiety    Diabetes mellitus    GERD (gastroesophageal reflux disease)    Hyperlipidemia    Hypertension    PAD (peripheral artery disease) (HCC)    a. s/p prior LE stenting 2012, 03/2017 - followed by [facility].   Prostate cancer ([place])    Sleep apnea 2016   wears CPAP sometimes     FAMILY HISTORY:   Family History  Problem Relation Age of Onset   Hyperlipidemia Mother    Hypertension Mother    Heart disease Father    Deep vein thrombosis Father    Hyperlipidemia Father    Hypertension Father    Heart attack Father    Peripheral vascular disease Father    Cancer Sister     SOCIAL HISTORY:   Social History   Tobacco Use   Smoking status: Never    Passive exposure: Never   Smokeless tobacco: Former    Quit date: 10/26/1981  Substance Use Topics   Alcohol use: Yes    Comment: occasional drink, maybe a glass of wine once every 6 months     ALLERGIES:   Allergies  Allergen Reactions   Demerol Anaphylaxis   Lactose Intolerance (Gi) Anaphylaxis   Oxycodone Anxiety and Other (See Comments)    Suicidal ideation   Penicillins Anaphylaxis and Other (See Comments)    Tolerates cefepime    Actos [Pioglitazone] Nausea Only   Exenatide Nausea Only and Other (See Comments)   Glimepiride Other  (See Comments)    Edema in ankles    Nabumetone Nausea Only and Other (See Comments)   Victoza [Liraglutide] Nausea Only    Shaking   Fentanyl Anxiety    Jittery    Hydrocodone Anxiety    Suicidal ideation     CURRENT MEDICATIONS:   Current Outpatient Medications  Medication Sig Dispense Refill   ACCU-CHEK AVIVA PLUS test strip AS DIRECTED SEVEN TIMES A DAY IN VITRO 90 DAYS  4   acetaminophen (TYLENOL) 500 MG tablet Take 1,000 mg by mouth every 6 (six) hours as needed for mild pain.     amLODipine (NORVASC) 10 MG tablet Take 10 mg by mouth daily.     atorvastatin (LIPITOR) 40 MG tablet Take 40 mg by mouth at bedtime.  5   benazepril (LOTENSIN) 40 MG tablet Take 40 mg by mouth daily.     clonazePAM (KLONOPIN) 1 MG tablet Take 1 mg by mouth at bedtime as needed (sleep).     clopidogrel (PLAVIX) 75 MG tablet TAKE 1 TABLET BY MOUTH EVERY DAY WITH BREAKFAST (Patient taking differently: Take 75 mg by mouth daily.) 90 tablet 3   Continuous Blood Gluc Receiver (FREESTYLE LIBRE 14 DAY READER) DEVI use to monitor blood sugar     dapagliflozin propanediol (FARXIGA) 5 MG  TABS tablet 5 mg daily.     Dulaglutide 1.5 MG/0.5ML SOPN Inject 1.5 mg into the skin once a week. Sunday     famotidine (PEPCID) 20 MG tablet Take 20 mg by mouth 2 (two) times daily.     fluticasone (FLONASE) 50 MCG/ACT nasal spray Place 2 sprays into both nostrils at bedtime as needed for allergies (before uses CPAP if needed).     folic acid (FOLVITE) 1 MG tablet Take 1 mg by mouth daily.     HUMALOG KWIKPEN 100 UNIT/ML KwikPen Inject 34 Units into the skin 2 (two) times daily.     hydrochlorothiazide (HYDRODIURIL) 25 MG tablet Take 1 tablet (25 mg total) by mouth daily. Please make overdue appt with Dr. Irish Lack before anymore refills. 2nd attempt 15 tablet 0   HYDROmorphone (DILAUDID) 2 MG tablet Take 1 tablet (2 mg total) by mouth every 4 (four) hours as needed for severe pain. 40 tablet 0   ibuprofen (ADVIL,MOTRIN) 200 MG  tablet Take 400-800 mg by mouth daily as needed for headache.     insulin glargine (LANTUS) 100 UNIT/ML injection Inject 10 Units into the skin at bedtime.     metFORMIN (GLUCOPHAGE-XR) 500 MG 24 hr tablet Take 2,000 mg by mouth at bedtime.     methotrexate 2.5 MG tablet Take 15 mg by mouth every Tuesday. 6 tablets weekly     metoprolol succinate (TOPROL-XL) 100 MG 24 hr tablet Take 100 mg by mouth daily.      oxyCODONE-acetaminophen (PERCOCET/ROXICET) 5-325 MG tablet Take 1 tablet by mouth every 6 (six) hours as needed for severe pain. 30 tablet 0   solifenacin (VESICARE) 5 MG tablet Take 5 mg by mouth daily.     sulfamethoxazole-trimethoprim (BACTRIM DS) 800-160 MG tablet Take 1 tablet by mouth 2 (two) times daily. 28 tablet 0   tamsulosin (FLOMAX) 0.4 MG CAPS capsule Take 0.4 mg by mouth at bedtime.  5   traZODone (DESYREL) 50 MG tablet Take 50 mg by mouth at bedtime as needed for sleep.     cefadroxil (DURICEF) 500 MG capsule Take 2 capsules (1,000 mg total) by mouth 2 (two) times daily. (Patient not taking: Reported on 08/25/2021) 56 capsule 0   No current facility-administered medications for this visit.    REVIEW OF SYSTEMS:   '[X]'$  denotes positive finding, '[ ]'$  denotes negative finding Cardiac  Comments:  Chest pain or chest pressure:    Shortness of breath upon exertion:    Short of breath when lying flat:    Irregular heart rhythm:        Vascular    Pain in calf, thigh, or hip brought on by ambulation:    Pain in feet at night that wakes you up from your sleep:     Blood clot in your veins:    Leg swelling:         Pulmonary    Oxygen at home:    Productive cough:     Wheezing:         Neurologic    Sudden weakness in arms or legs:     Sudden numbness in arms or legs:     Sudden onset of difficulty speaking or slurred speech:    Temporary loss of vision in one eye:     Problems with dizziness:         Gastrointestinal    Blood in stool:     Vomited blood:  Genitourinary    Burning when urinating:     Blood in urine:        Psychiatric    Major depression:         Hematologic    Bleeding problems:    Problems with blood clotting too easily:        Skin    Rashes or ulcers:        Constitutional    Fever or chills:      PHYSICAL EXAM:   There were no vitals filed for this visit.  GENERAL: The patient is a well-nourished male, in no acute distress. The vital signs are documented above. CARDIAC: There is a regular rate and rhythm.  VASCULAR: Mild biofilm over top of right medial thigh vein harvest incision that is healing nicely PULMONARY: Non-labored respirations MUSCULOSKELETAL: There are no major deformities or cyanosis. NEUROLOGIC: No focal weakness or paresthesias are detected. SKIN: There are no ulcers or rashes noted. PSYCHIATRIC: The patient has a normal affect.  STUDIES:   I have reviewed the following: Right Carotid: Velocities in the right ICA are consistent with a 1-39%  stenosis.   Left Carotid: Velocities in the left ICA are consistent with a 1-39%  stenosis.   Vertebrals:  Bilateral vertebral arteries demonstrate antegrade flow.  Subclavians: Normal flow hemodynamics were seen in bilateral subclavian               arteries.  ABI/TBIToday's ABIToday's TBIPrevious ABIPrevious TBI  +-------+-----------+-----------+------------+------------+  Right  0.90       0.63       0.61        0.34          +-------+-----------+-----------+------------+------------+  Left   1.19       0.97       1.17        0.95          +-------+-----------+-----------+------------+------------+  Right toe pressure:  105 Left toe pressure:  163     Right: Patent bypass graft with elevated velocities observed in the distal  segment (>70%), anastomosis (50-70%) and native outflow artery (50-74%).  Category of stenosis based on velocity criteria, disease is not well  appreciated in the distal anastomosis or native  outflow artery; however,  the distal segment of the bypass (approximately 0.8 cm from the  anastomosis) appears mildly narrowed.   MEDICAL ISSUES:   I discussed the ultrasound results with the patient which indicate distal anastomotic stenosis.  I have recommended that we proceed with angiography via left femoral approach with right leg intervention likely at the distal femoral-popliteal bypass anastomosis.  Simultaneously I will plan on sharply debriding the wound and taking cultures as there does appear to be a biofilm over top.  This will be scheduled in the near future.    Leia Alf, MD, FACS [facility] [phone] Pager [phone]

## 2021-10-07 ENCOUNTER — Other Ambulatory Visit: Payer: Self-pay | Admitting: Surgery

## 2021-10-07 MED ORDER — HYDROMORPHONE HCL 2 MG PO TABS: 2.0000 mg | ORAL_TABLET | ORAL | 0 refills | Status: DC | PRN

## 2021-10-09 DIAGNOSIS — I10 Essential (primary) hypertension: Secondary | ICD-10-CM | POA: Diagnosis not present

## 2021-10-09 NOTE — Pre-Procedure Instructions (Signed)
Surgical Instructions    Your procedure is scheduled on October 15, 2021.  Report to [facility] Main Entrance "A" at 6:30 A.M., then check in with the Admitting office.  Call this number if you have problems the morning of surgery:  [phone]   If you have any questions prior to your surgery date call [phone]: Open Monday-Friday 8am-4pm    Remember:  Do not eat or drink after midnight the night before your surgery     Take these medicines the morning of surgery with A SIP OF WATER:  amLODipine (NORVASC)     Take these medicines the morning of surgery AS NEEDED:  acetaminophen (TYLENOL)   HYDROmorphone (DILAUDID)  Please follow your surgeon's instructions on when to stop clopidogrel (PLAVIX). If you have not received instructions, please contact your surgeon's office.   As of today, STOP taking any Aspirin (unless otherwise instructed by your surgeon) Aleve, Naproxen, Ibuprofen, Motrin, Advil, Goody's, BC's, all herbal medications, fish oil, and all vitamins.           WHAT DO I DO ABOUT MY DIABETES MEDICATION?   Do not take metFORMIN (GLUCOPHAGE-XR) the morning of surgery.  STOP taking dapagliflozin propanediol (FARXIGA) three days prior to surgery.  Take half of your normal dose of insulin glargine (LANTUS) 10 units at bedtime the night before surgery.   The morning of surgery, if your CBG is greater than 220 mg/dL, you may take  of your HUMALOG dose of insulin, which would be 15 units.   HOW TO MANAGE YOUR DIABETES BEFORE AND AFTER SURGERY  Why is it important to control my blood sugar before and after surgery? Improving blood sugar levels before and after surgery helps healing and can limit problems. A way of improving blood sugar control is eating a healthy diet by:  Eating less sugar and carbohydrates  Increasing activity/exercise  Talking with your doctor about reaching your blood sugar goals High blood sugars (greater than 180 mg/dL) can raise your  risk of infections and slow your recovery, so you will need to focus on controlling your diabetes during the weeks before surgery. Make sure that the doctor who takes care of your diabetes knows about your planned surgery including the date and location.  How do I manage my blood sugar before surgery? Check your blood sugar at least 4 times a day, starting 2 days before surgery, to make sure that the level is not too high or low.  Check your blood sugar the morning of your surgery when you wake up and every 2 hours until you get to the Short Stay unit.  If your blood sugar is less than 70 mg/dL, you will need to treat for low blood sugar: Do not take insulin. Treat a low blood sugar (less than 70 mg/dL) with  cup of clear juice (cranberry or apple), 4 glucose tablets, OR glucose gel. Recheck blood sugar in 15 minutes after treatment (to make sure it is greater than 70 mg/dL). If your blood sugar is not greater than 70 mg/dL on recheck, call [phone] for further instructions. Report your blood sugar to the short stay nurse when you get to Short Stay.  If you are admitted to the hospital after surgery: Your blood sugar will be checked by the staff and you will probably be given insulin after surgery (instead of oral diabetes medicines) to make sure you have good blood sugar levels. The goal for blood sugar control after surgery is 80-180 mg/dL.  Do NOT Smoke (Tobacco/Vaping) for 24 hours prior to your procedure.  If you use a CPAP at night, you may bring your mask/headgear for your overnight stay.   Contacts, glasses, piercing's, hearing aid's, dentures or partials may not be worn into surgery, please bring cases for these belongings.    For patients admitted to the hospital, discharge time will be determined by your treatment team.   Patients discharged the day of surgery will not be allowed to drive home, and someone needs to stay with them for 24 hours.  SURGICAL WAITING  ROOM VISITATION Patients having surgery or a procedure may have no more than 2 support people in the waiting area - these visitors may rotate.   Children under the age of 100 must have an adult with them who is not the patient. If the patient needs to stay at the hospital during part of their recovery, the visitor guidelines for inpatient rooms apply. Pre-op nurse will coordinate an appropriate time for 1 support person to accompany patient in pre-op.  This support person may not rotate.   Please refer to the [facility] website for the visitor guidelines for Inpatients (after your surgery is over and you are in a regular room).    Special instructions:   [facility]- Preparing For Surgery  Before surgery, you can play an important role. Because skin is not sterile, your skin needs to be as free of germs as possible. You can reduce the number of germs on your skin by washing with CHG (chlorahexidine gluconate) Soap before surgery.  CHG is an antiseptic cleaner which kills germs and bonds with the skin to continue killing germs even after washing.    Oral Hygiene is also important to reduce your risk of infection.  Remember - BRUSH YOUR TEETH THE MORNING OF SURGERY WITH YOUR REGULAR TOOTHPASTE  Please do not use if you have an allergy to CHG or antibacterial soaps. If your skin becomes reddened/irritated stop using the CHG.  Do not shave (including legs and underarms) for at least 48 hours prior to first CHG shower. It is OK to shave your face.  Please follow these instructions carefully.   Shower the NIGHT BEFORE SURGERY and the MORNING OF SURGERY  If you chose to wash your hair, wash your hair first as usual with your normal shampoo.  After you shampoo, rinse your hair and body thoroughly to remove the shampoo.  Use CHG Soap as you would any other liquid soap. You can apply CHG directly to the skin and wash gently with a scrungie or a clean washcloth.   Apply the CHG Soap to your body  ONLY FROM THE NECK DOWN.  Do not use on open wounds or open sores. Avoid contact with your eyes, ears, mouth and genitals (private parts). Wash Face and genitals (private parts)  with your normal soap.   Wash thoroughly, paying special attention to the area where your surgery will be performed.  Thoroughly rinse your body with warm water from the neck down.  DO NOT shower/wash with your normal soap after using and rinsing off the CHG Soap.  Pat yourself dry with a CLEAN TOWEL.  Wear CLEAN PAJAMAS to bed the night before surgery  Place CLEAN SHEETS on your bed the night before your surgery  DO NOT SLEEP WITH PETS.   Day of Surgery: Take a shower with CHG soap. Do not wear jewelry or makeup Do not wear lotions, powders, perfumes/colognes, or deodorant. Do not shave 48  hours prior to surgery.  Men may shave face and neck. Do not bring valuables to the hospital.  [facility] is not responsible for any belongings or valuables. Do not wear nail polish, gel polish, artificial nails, or any other type of covering on natural nails (fingers and toes) If you have artificial nails or gel coating that need to be removed by a nail salon, please have this removed prior to surgery. Artificial nails or gel coating may interfere with anesthesia's ability to adequately monitor your vital signs.  Wear Clean/Comfortable clothing the morning of surgery Remember to brush your teeth WITH YOUR REGULAR TOOTHPASTE.   Please read over the following fact sheets that you were given.    If you received a COVID test during your pre-op visit  it is requested that you wear a mask when out in public, stay away from anyone that may not be feeling well and notify your surgeon if you develop symptoms. If you have been in contact with anyone that has tested positive in the last 10 days please notify you surgeon.

## 2021-10-10 ENCOUNTER — Other Ambulatory Visit: Payer: Self-pay

## 2021-10-10 ENCOUNTER — Encounter (HOSPITAL_COMMUNITY)
Admission: RE | Admit: 2021-10-10 | Discharge: 2021-10-10 | Disposition: A | Discharge status: Home / Self Care | Payer: Medicare Other | Source: Ambulatory Visit | Attending: Surgery | Admitting: Surgery

## 2021-10-10 ENCOUNTER — Encounter (HOSPITAL_COMMUNITY): Payer: Self-pay

## 2021-10-10 VITALS — BP 130/80 | HR 92 | Temp 99.3°F | Resp 18 | Ht 72.0 in | Wt 220.0 lb

## 2021-10-10 DIAGNOSIS — E785 Hyperlipidemia, unspecified: Secondary | ICD-10-CM | POA: Insufficient documentation

## 2021-10-10 DIAGNOSIS — I2510 Atherosclerotic heart disease of native coronary artery without angina pectoris: Secondary | ICD-10-CM | POA: Diagnosis not present

## 2021-10-10 DIAGNOSIS — E1151 Type 2 diabetes mellitus with diabetic peripheral angiopathy without gangrene: Secondary | ICD-10-CM | POA: Diagnosis not present

## 2021-10-10 DIAGNOSIS — E119 Type 2 diabetes mellitus without complications: Secondary | ICD-10-CM | POA: Insufficient documentation

## 2021-10-10 DIAGNOSIS — I10 Essential (primary) hypertension: Secondary | ICD-10-CM | POA: Diagnosis not present

## 2021-10-10 DIAGNOSIS — Z794 Long term (current) use of insulin: Secondary | ICD-10-CM | POA: Insufficient documentation

## 2021-10-10 DIAGNOSIS — Z01812 Encounter for preprocedural laboratory examination: Secondary | ICD-10-CM | POA: Diagnosis not present

## 2021-10-10 DIAGNOSIS — Z01818 Encounter for other preprocedural examination: Secondary | ICD-10-CM

## 2021-10-10 DIAGNOSIS — I70213 Atherosclerosis of native arteries of extremities with intermittent claudication, bilateral legs: Secondary | ICD-10-CM | POA: Diagnosis not present

## 2021-10-10 HISTORY — DX: Unspecified osteoarthritis, unspecified site: M19.90

## 2021-10-10 LAB — CBC
HCT: 39.8 % (ref 39.0–52.0)
Hemoglobin: 12.3 g/dL — ABNORMAL LOW (ref 13.0–17.0)
MCH: 28.3 pg (ref 26.0–34.0)
MCHC: 30.9 g/dL (ref 30.0–36.0)
MCV: 91.5 fL (ref 80.0–100.0)
Platelets: 318 10*3/uL (ref 150–400)
RBC: 4.35 MIL/uL (ref 4.22–5.81)
RDW: 15.7 % — ABNORMAL HIGH (ref 11.5–15.5)
WBC: 7.6 10*3/uL (ref 4.0–10.5)
nRBC: 0 % (ref 0.0–0.2)

## 2021-10-10 LAB — GLUCOSE, CAPILLARY: Glucose-Capillary: 164 mg/dL — ABNORMAL HIGH (ref 70–99)

## 2021-10-10 LAB — HEMOGLOBIN A1C
Hgb A1c MFr Bld: 7.4 % — ABNORMAL HIGH (ref 4.8–5.6)
Mean Plasma Glucose: 165.68 mg/dL

## 2021-10-10 LAB — BASIC METABOLIC PANEL
Anion gap: 10 (ref 5–15)
BUN: 14 mg/dL (ref 8–23)
CO2: 27 mmol/L (ref 22–32)
Calcium: 9.9 mg/dL (ref 8.9–10.3)
Chloride: 103 mmol/L (ref 98–111)
Creatinine, Ser: 0.9 mg/dL (ref 0.61–1.24)
GFR, Estimated: >60 mL/min (ref >60)
Glucose, Bld: 144 mg/dL — ABNORMAL HIGH (ref 70–99)
Potassium: 4.3 mmol/L (ref 3.5–5.1)
Sodium: 140 mmol/L (ref 135–145)

## 2021-10-10 LAB — SURGICAL PCR SCREEN
MRSA, PCR: NEGATIVE
Staphylococcus aureus: NEGATIVE

## 2021-10-10 NOTE — Progress Notes (Signed)
PCP - [name], FNP Cardiologist - Dr. Irish Lack  PPM/ICD - Denies Device Orders - n/a Rep Notified - n/a  Chest x-ray - n/a EKG - 05/04/2021 Stress Test - 01/27/2013 ECHO - 11/18/2017 Cardiac Cath - Denies  Sleep Study - Per pt, had sleep study 10+ years ago CPAP - Pt has CPAP but does not wear it regularly.  Fasting Blood Sugar - 103-154 Pt has CBM on left arm at PAT visit and checks his sugar 15-20x per day. CBG 164 at PAT visit. Had a Boost drink and crackers prior to visit.  Blood Thinner Instructions: Pt has already stopped Plavix per MD instructions. Last Dose was 8/8 Aspirin Instructions: Stop asa today  NPO after midnight  COVID TEST- n/a   Anesthesia review: Yes. Cardiac Hx.   Patient denies shortness of breath, fever, cough and chest pain at PAT appointment   All instructions explained to the patient, with a verbal understanding of the material. Patient agrees to go over the instructions while at home for a better understanding. Patient also instructed to self quarantine after being tested for COVID-19. The opportunity to ask questions was provided.

## 2021-10-13 NOTE — Progress Notes (Signed)
Anesthesia Chart Review:  Case: [id number] Date/Time: 10/15/21 0815   Procedures:      IRRIGATION AND DEBRIDEMENT RIGHT LEG (Right)     RIGHT LEG ANGIOGRAM WITH ANGIOPLASTY (Right)   Anesthesia type: General   Pre-op diagnosis: Atherosclerosis of native artery of both lower extremities with intermittent claudication   Location: [facility] OR ROOM 16 / [place] [facility]   Surgeons: Serafina Mitchell, MD       DISCUSSION: Patient is a 71 year old male scheduled for the above procedure. He is s/p right FPBG on 04/16/21 with subsequent wound debridement of his vein harvest sites. At his 10/06/21 follow-up, bypass was patent but with elevated velocities in the distal segment, anastomosis, and native outflow artery. The above procedures recommended.  History includes never smoker, HTN, DM2, HLD, PAD (right SFA/popliteal artery stent 11/05/10; atherectomy/angioplasty right SFA 09/01/16, 03/16/17 & 01/30/21 with early occlusion; right CFA/profunda femoris endarterectomy & right Femoral-above knee Popliteal bypass using SVG 04/16/21; s/p debridement RLE incisions for vein harvest site infection 05/02/21& 05/05/21), OSA (inconsistent CPAP use), prostate cancer (01/12/18, s/p radiation therapy 04/11/18-05/18/18), GERD.  BMI is consistent with obesity.   Last seen by cardiologist Dr. Irish Lack 05/29/2020, and at that time described some DOE and atypical chest pain. Coronary CTA was ordered.  Scan showed mild nonobstructive CAD. Recommendation was to consider nonatherosclerotic causes of chest pain.   He has DM2. Regimen includes Farxiga 5 mg daily, Humalog 30 units BID, Lantus 20 units Q HS, metformin XR 2000 mg Q HS. DM medication perioperative instructions included holding [place] for 3 days prior to surgery. A1c 7.4%, down from 8.0% on 04/16/21.  He reported last Plavix dose 10/07/21, although [facility] instructions list instructions to hold after 10/09/21.   Anesthesia team to evaluate on the day of surgery.   VS: BP 130/80 Comment: manual  Pulse  92   Temp 37.4 C (Oral)   Resp 18   Ht 6' (1.829 m)   Wt 99.8 kg   SpO2 98%   BMI 29.84 kg/m    PROVIDERS: Stamey, Girtha Rm, FNP s PCP  Larae Grooms, MD is cardiologist Delrae Rend, MD is endocrinologist Baruch Gouty, MD is urologist Tyler Pita, MD is RAD-[facility] Gavin Pound, MD is rheumatologist    LABS: Labs reviewed: Acceptable for surgery. (all labs ordered are listed, but only abnormal results are displayed)  Labs Reviewed  GLUCOSE, CAPILLARY - Abnormal; Notable for the following components:      Result Value   Glucose-Capillary 164 (*)    All other components within normal limits  HEMOGLOBIN A1C - Abnormal; Notable for the following components:   Hgb A1c MFr Bld 7.4 (*)    All other components within normal limits  BASIC METABOLIC PANEL - Abnormal; Notable for the following components:   Glucose, Bld 144 (*)    All other components within normal limits  CBC - Abnormal; Notable for the following components:   Hemoglobin 12.3 (*)    RDW 15.7 (*)    All other components within normal limits  SURGICAL PCR SCREEN    IMAGES: CT right femur 05/01/21: IMPRESSION: 1. Subcutaneous gas at the medial RIGHT thigh soft tissues, extending to the femoral vascular sheath. Findings greater than expected residual 2 weeks post surgery. 2. No CT evidence of osteomyelitis, focal drainable collection or abscess.     EKG: 05/04/21: Normal sinus rhythm Nonspecific T wave abnormality Abnormal ECG When compared with ECG of 05-Nov-2010 07:59, PREVIOUS ECG IS PRESENT No significant change since last tracing Confirmed  by Shelva Majestic [phone]) on 05/04/2021 9:37:57 AM    CV: US Carotid 08/11/21: Summary:  - Right Carotid: Velocities in the right ICA are consistent with a 1-39% stenosis.  - Left Carotid: Velocities in the left ICA are consistent with a 1-39% stenosis.  - Vertebrals:  Bilateral vertebral arteries demonstrate antegrade flow.  - Subclavians: Normal flow  hemodynamics were seen in bilateral subclavian arteries.    Coronary CTA 06/10/20: IMPRESSION: 1. Coronary calcium score of 318. This was 63rd percentile for age and sex matched controls. 2. Normal coronary origin with right dominance. 3. Mild CAD (25-49%) in the mid LAD and mid LCX. 4. Minimal CAD (<25%) in the RCA). RECOMMENDATIONS: 1. Mild non-obstructive CAD (25-49%). Consider non-atherosclerotic causes of chest pain. Consider preventive therapy and risk factor modification.     TTE 11/18/17: - Left ventricle: The cavity size was normal. There was moderate    concentric hypertrophy. Systolic function was vigorous. The    estimated ejection fraction was in the range of 65% to 70%. Wall    motion was normal; there were no regional wall motion    abnormalities. Doppler parameters are consistent with abnormal    left ventricular relaxation (grade 1 diastolic dysfunction).    There was no evidence of elevated ventricular filling pressure by    Doppler parameters.  - Aortic root: The aortic root was normal in size.  - Left atrium: The atrium was normal in size.  - Right ventricle: Systolic function was normal.  - Right atrium: The atrium was normal in size.  - Tricuspid valve: There was no regurgitation.  - Inferior vena cava: The vessel was normal in size.  - Pericardium, extracardiac: There was no pericardial effusion.     Past Medical History:  Diagnosis Date   Allergy    Anxiety    Arthritis    psoriatic arthritis   Diabetes mellitus    GERD (gastroesophageal reflux disease)    Hyperlipidemia    Hypertension    PAD (peripheral artery disease) ([place])    a. s/p prior LE stenting 2012, 03/2017 - followed by VVS.   Prostate cancer ([place])    Sleep apnea 2016   wears CPAP sometimes    Past Surgical History:  Procedure Laterality Date   ABDOMINAL AORTOGRAM N/A 09/01/2016   Procedure: Abdominal Aortogram;  Surgeon: Serafina Mitchell, MD;  Location: [place] [facility];   Service: Cardiovascular;  Laterality: N/A;   ABDOMINAL AORTOGRAM W/LOWER EXTREMITY N/A 03/16/2017   Procedure: ABDOMINAL AORTOGRAM W/LOWER EXTREMITY;  Surgeon: Serafina Mitchell, MD;  Location: [place] [facility];  Service: Cardiovascular;  Laterality: N/A;   ANGIOPLASTY Right 01/30/2021   Procedure: BALLOON ANGIOPLASTY;  Surgeon: Serafina Mitchell, MD;  Location: [place];  Service: Vascular;  Laterality: Right;   ANGIOPLASTY / STENTING FEMORAL  11/05/2010   Left SFA stent   APPLICATION OF WOUND VAC Right 04/16/2021   Procedure: APPLICATION OF WOUND VAC;  Surgeon: Serafina Mitchell, MD;  Location: [place];  Service: Vascular;  Laterality: Right;   APPLICATION OF WOUND VAC Right 05/02/2021   Procedure: APPLICATION OF WOUND VAC;  Surgeon: Angelia Mould, MD;  Location: [place];  Service: Vascular;  Laterality: Right;   APPLICATION OF WOUND VAC Right 05/05/2021   Procedure: WOUND VAC CHANGE RIGHT GROIN, APPLICATION OF WOUND VAC PROXIMAL AND DISTAL MEDIAL RIGHT THIGH;  Surgeon: Cherre Robins, MD;  Location: [place];  Service: Vascular;  Laterality: Right;   ENDARTERECTOMY FEMORAL Right 04/16/2021  Procedure: RIGHT FEMORAL ENDARTERECTOMY;  Surgeon: Serafina Mitchell, MD;  Location: [place];  Service: Vascular;  Laterality: Right;   EYE SURGERY  2010   bilateral cataract   FEMORAL-POPLITEAL BYPASS GRAFT Right 04/16/2021   Procedure: RIGHT FEMORAL-POPLITEAL BYPASS USING VEIN;  Surgeon: Serafina Mitchell, MD;  Location: [place];  Service: Vascular;  Laterality: Right;   GROIN DEBRIDEMENT Right 05/05/2021   Procedure: IRRIGATION AND DEBRIDEMENT RIGHT GROIN AND RIGHT THIGH;  Surgeon: Cherre Robins, MD;  Location: [place];  Service: Vascular;  Laterality: Right;   I & D EXTREMITY Right 05/02/2021   Procedure: IRRIGATION AND DEBRIDEMENT RIGHT LEG;  Surgeon: Angelia Mould, MD;  Location: [facility];  Service: Vascular;  Laterality: Right;   KNEE ARTHROSCOPY     LOWER EXTREMITY ANGIOGRAM Right 01/30/2021    Procedure: LOWER EXTREMITY ANGIOGRAM WITH ARTERIOGRAM OF RIGHT LOWER EXTREMITY;  Surgeon: Serafina Mitchell, MD;  [facility];  Service: Vascular;  Laterality: Right;   LOWER EXTREMITY ANGIOGRAPHY N/A 09/01/2016   Procedure: Lower Extremity Angiography;  Surgeon: Serafina Mitchell, MD;  Location: [place] [facility];  Service: Cardiovascular;  Laterality: N/A;   PERIPHERAL VASCULAR ATHERECTOMY Right 09/01/2016   Procedure: Peripheral Vascular Atherectomy;  Surgeon: Serafina Mitchell, MD;  Location: [place] [facility];  Service: Cardiovascular;  Laterality: Right;  superficial femoral   PERIPHERAL VASCULAR INTERVENTION Right 03/16/2017   Procedure: PERIPHERAL VASCULAR INTERVENTION;  Surgeon: Serafina Mitchell, MD;  Location: [place] [facility];  Service: Cardiovascular;  Laterality: Right;  superficicial femoral   ROTATOR CUFF REPAIR     1990   ROTATOR CUFF REPAIR Bilateral 2017   ULTRASOUND GUIDANCE FOR VASCULAR ACCESS Left 01/30/2021   Procedure: ULTRASOUND GUIDANCE FOR VASCULAR ACCESS;  Surgeon: Serafina Mitchell, MD;  [facility];  Service: Vascular;  Laterality: Left;   VEIN HARVEST Right 04/16/2021   Procedure: RIGHT GREATER SAPHENOUS VEIN HARVEST;  Surgeon: Serafina Mitchell, MD;  [facility];  Service: Vascular;  Laterality: Right;    MEDICATIONS:  ACCU-CHEK AVIVA PLUS test strip   acetaminophen (TYLENOL) 500 MG tablet   amLODipine (NORVASC) 10 MG tablet   atorvastatin (LIPITOR) 40 MG tablet   benazepril (LOTENSIN) 40 MG tablet   cefadroxil (DURICEF) 500 MG capsule   clonazePAM (KLONOPIN) 1 MG tablet   clopidogrel (PLAVIX) 75 MG tablet   Continuous Blood Gluc Receiver (FREESTYLE LIBRE 14 DAY READER) DEVI   dapagliflozin propanediol (FARXIGA) 5 MG TABS tablet   diphenhydrAMINE (BENADRYL) 25 MG tablet   fluticasone (FLONASE) 50 MCG/ACT nasal spray   folic acid (FOLVITE) 1 MG tablet   HUMALOG KWIKPEN 100 UNIT/ML KwikPen   hydrochlorothiazide (HYDRODIURIL) 25 MG tablet    HYDROmorphone (DILAUDID) 2 MG tablet   insulin glargine (LANTUS) 100 UNIT/ML injection   metFORMIN (GLUCOPHAGE-XR) 500 MG 24 hr tablet   oxyCODONE-acetaminophen (PERCOCET/ROXICET) 5-325 MG tablet   solifenacin (VESICARE) 5 MG tablet   sulfamethoxazole-trimethoprim (BACTRIM DS) 800-160 MG tablet   tamsulosin (FLOMAX) 0.4 MG CAPS capsule   traZODone (DESYREL) 50 MG tablet   trolamine salicylate (ASPERCREME) 10 % cream   No current facility-administered medications for this encounter.    Myra Gianotti, PA-C Surgical Short Stay/Anesthesiology [facility] Phone [phone] [facility] Phone [phone] 10/13/2021 11:40 AM

## 2021-10-13 NOTE — Anesthesia Preprocedure Evaluation (Signed)
Anesthesia Evaluation  Patient identified by MRN, date of birth, ID band Patient awake    Reviewed: Allergy & Precautions, NPO status , Patient's Chart, lab work & pertinent test results  Airway Mallampati: III  TM Distance: >3 FB Neck ROM: Full    Dental  (+) Missing,    Pulmonary sleep apnea and Continuous Positive Airway Pressure Ventilation ,    Pulmonary exam normal        Cardiovascular hypertension, Pt. on medications + Peripheral Vascular Disease  Normal cardiovascular exam     Neuro/Psych Anxiety negative neurological ROS     GI/Hepatic negative GI ROS, Neg liver ROS,   Endo/Other  diabetes, Insulin Dependent, Oral Hypoglycemic Agents  Renal/GU negative Renal ROS     Musculoskeletal  (+) Arthritis ,   Abdominal   Peds  Hematology negative hematology ROS (+)   Anesthesia Other Findings Atherosclerosis of native artery of both lower extremities with intermittent claudication  Reproductive/Obstetrics                           Anesthesia Physical Anesthesia Plan  ASA: 3  Anesthesia Plan: General   Post-op Pain Management:    Induction: Intravenous  PONV Risk Score and Plan: 2 and Ondansetron, Dexamethasone, Midazolam and Treatment may vary due to age or medical condition  Airway Management Planned: LMA  Additional Equipment:   Intra-op Plan:   Post-operative Plan: Extubation in OR  Informed Consent: I have reviewed the patients History and Physical, chart, labs and discussed the procedure including the risks, benefits and alternatives for the proposed anesthesia with the patient or authorized representative who has indicated his/her understanding and acceptance.     Dental advisory given  Plan Discussed with: CRNA  Anesthesia Plan Comments: (PAT note written 10/13/2021 by Myra Gianotti, PA-C. )      Anesthesia Quick Evaluation

## 2021-10-15 ENCOUNTER — Encounter (HOSPITAL_COMMUNITY)
Admission: RE | Disposition: A | Discharge status: Home / Self Care | Payer: Self-pay | Source: Home / Self Care | Attending: Surgery

## 2021-10-15 ENCOUNTER — Telehealth: Payer: Self-pay | Admitting: Surgery

## 2021-10-15 ENCOUNTER — Ambulatory Visit (HOSPITAL_COMMUNITY)
Admission: RE | Admit: 2021-10-15 | Discharge: 2021-10-15 | Disposition: A | Discharge status: Home / Self Care | Payer: Medicare Other | Attending: Surgery | Admitting: Surgery

## 2021-10-15 ENCOUNTER — Other Ambulatory Visit: Payer: Self-pay

## 2021-10-15 ENCOUNTER — Ambulatory Visit (HOSPITAL_COMMUNITY): Payer: Medicare Other | Admitting: Physician Assistant

## 2021-10-15 ENCOUNTER — Ambulatory Visit (HOSPITAL_BASED_OUTPATIENT_CLINIC_OR_DEPARTMENT_OTHER): Payer: Medicare Other | Admitting: Certified Registered"

## 2021-10-15 ENCOUNTER — Ambulatory Visit (HOSPITAL_COMMUNITY): Payer: Medicare Other

## 2021-10-15 DIAGNOSIS — Z7984 Long term (current) use of oral hypoglycemic drugs: Secondary | ICD-10-CM | POA: Diagnosis not present

## 2021-10-15 DIAGNOSIS — Z95828 Presence of other vascular implants and grafts: Secondary | ICD-10-CM | POA: Diagnosis not present

## 2021-10-15 DIAGNOSIS — Z794 Long term (current) use of insulin: Secondary | ICD-10-CM | POA: Insufficient documentation

## 2021-10-15 DIAGNOSIS — E1151 Type 2 diabetes mellitus with diabetic peripheral angiopathy without gangrene: Secondary | ICD-10-CM | POA: Diagnosis not present

## 2021-10-15 DIAGNOSIS — I10 Essential (primary) hypertension: Secondary | ICD-10-CM | POA: Insufficient documentation

## 2021-10-15 DIAGNOSIS — E119 Type 2 diabetes mellitus without complications: Secondary | ICD-10-CM | POA: Diagnosis not present

## 2021-10-15 DIAGNOSIS — I70321 Atherosclerosis of unspecified type of bypass graft(s) of the extremities with rest pain, right leg: Secondary | ICD-10-CM | POA: Diagnosis not present

## 2021-10-15 DIAGNOSIS — Z87891 Personal history of nicotine dependence: Secondary | ICD-10-CM

## 2021-10-15 DIAGNOSIS — T8189XA Other complications of procedures, not elsewhere classified, initial encounter: Secondary | ICD-10-CM | POA: Insufficient documentation

## 2021-10-15 DIAGNOSIS — T8149XA Infection following a procedure, other surgical site, initial encounter: Secondary | ICD-10-CM

## 2021-10-15 DIAGNOSIS — I70213 Atherosclerosis of native arteries of extremities with intermittent claudication, bilateral legs: Secondary | ICD-10-CM

## 2021-10-15 DIAGNOSIS — G4730 Sleep apnea, unspecified: Secondary | ICD-10-CM | POA: Insufficient documentation

## 2021-10-15 DIAGNOSIS — I70231 Atherosclerosis of native arteries of right leg with ulceration of thigh: Secondary | ICD-10-CM | POA: Insufficient documentation

## 2021-10-15 DIAGNOSIS — I739 Peripheral vascular disease, unspecified: Secondary | ICD-10-CM

## 2021-10-15 DIAGNOSIS — X58XXXA Exposure to other specified factors, initial encounter: Secondary | ICD-10-CM | POA: Diagnosis not present

## 2021-10-15 DIAGNOSIS — S81801A Unspecified open wound, right lower leg, initial encounter: Secondary | ICD-10-CM | POA: Diagnosis not present

## 2021-10-15 HISTORY — PX: I & D EXTREMITY: SHX5045

## 2021-10-15 HISTORY — PX: ANGIOPLASTY: SHX39

## 2021-10-15 HISTORY — PX: AORTOGRAM: SHX6300

## 2021-10-15 HISTORY — PX: ULTRASOUND GUIDANCE FOR VASCULAR ACCESS: SHX6516

## 2021-10-15 LAB — POCT I-STAT, CHEM 8
BUN: 21 mg/dL (ref 8–23)
Calcium, Ion: 1.02 mmol/L — ABNORMAL LOW (ref 1.15–1.40)
Chloride: 106 mmol/L (ref 98–111)
Creatinine, Ser: 0.7 mg/dL (ref 0.61–1.24)
Glucose, Bld: 164 mg/dL — ABNORMAL HIGH (ref 70–99)
HCT: 33 % — ABNORMAL LOW (ref 39.0–52.0)
Hemoglobin: 11.2 g/dL — ABNORMAL LOW (ref 13.0–17.0)
Potassium: 4.3 mmol/L (ref 3.5–5.1)
Sodium: 139 mmol/L (ref 135–145)
TCO2: 24 mmol/L (ref 22–32)

## 2021-10-15 LAB — GLUCOSE, CAPILLARY
Glucose-Capillary: 163 mg/dL — ABNORMAL HIGH (ref 70–99)
Glucose-Capillary: 207 mg/dL — ABNORMAL HIGH (ref 70–99)

## 2021-10-15 SURGERY — IRRIGATION AND DEBRIDEMENT EXTREMITY
Anesthesia: General | Site: Leg Upper | Laterality: Right

## 2021-10-15 MED ORDER — EPHEDRINE SULFATE-NACL 50-0.9 MG/10ML-% IV SOSY
PREFILLED_SYRINGE | INTRAVENOUS | Status: DC | PRN
  Administered 2021-10-15: 5 mg via INTRAVENOUS

## 2021-10-15 MED ORDER — FENTANYL CITRATE (PF) 250 MCG/5ML IJ SOLN
INTRAMUSCULAR | Status: DC | PRN
  Administered 2021-10-15 (×2): 25 ug via INTRAVENOUS

## 2021-10-15 MED ORDER — FENTANYL CITRATE (PF) 100 MCG/2ML IJ SOLN: 25.0000 ug | INTRAMUSCULAR | Status: DC | PRN

## 2021-10-15 MED ORDER — 0.9 % SODIUM CHLORIDE (POUR BTL) OPTIME
TOPICAL | Status: DC | PRN
  Administered 2021-10-15: 1000 mL

## 2021-10-15 MED ORDER — PHENYLEPHRINE 80 MCG/ML (10ML) SYRINGE FOR IV PUSH (FOR BLOOD PRESSURE SUPPORT)
PREFILLED_SYRINGE | INTRAVENOUS | Status: DC | PRN
  Administered 2021-10-15 (×2): 160 ug via INTRAVENOUS
  Administered 2021-10-15: 80 ug via INTRAVENOUS

## 2021-10-15 MED ORDER — CHLORHEXIDINE GLUCONATE 0.12 % MT SOLN
15.0000 mL | Freq: Once | OROMUCOSAL | Status: AC
  Administered 2021-10-15: 15 mL via OROMUCOSAL

## 2021-10-15 MED ORDER — HEPARIN SODIUM (PORCINE) 1000 UNIT/ML IJ SOLN
INTRAMUSCULAR | Status: DC | PRN
  Administered 2021-10-15: 10000 [IU] via INTRAVENOUS

## 2021-10-15 MED ORDER — ONDANSETRON HCL 4 MG/2ML IJ SOLN
INTRAMUSCULAR | Status: DC | PRN
  Administered 2021-10-15: 4 mg via INTRAVENOUS

## 2021-10-15 MED ORDER — PHENYLEPHRINE HCL-NACL 20-0.9 MG/250ML-% IV SOLN: INTRAVENOUS | Status: DC | PRN

## 2021-10-15 MED ORDER — MIDAZOLAM HCL 2 MG/2ML IJ SOLN
INTRAMUSCULAR | Status: DC | PRN
  Administered 2021-10-15: 2 mg via INTRAVENOUS

## 2021-10-15 MED ORDER — VANCOMYCIN HCL 1500 MG/300ML IV SOLN
1500.0000 mg | INTRAVENOUS | Status: AC
  Administered 2021-10-15: 1500 mg via INTRAVENOUS
  Filled 2021-10-15: qty 300

## 2021-10-15 MED ORDER — INSULIN ASPART 100 UNIT/ML IJ SOLN: 0.0000 [IU] | INTRAMUSCULAR | Status: DC | PRN

## 2021-10-15 MED ORDER — HEPARIN 6000 UNIT IRRIGATION SOLUTION
Status: DC | PRN
  Administered 2021-10-15: 1

## 2021-10-15 MED ORDER — PHENYLEPHRINE HCL-NACL 20-0.9 MG/250ML-% IV SOLN
INTRAVENOUS | Status: DC | PRN
  Administered 2021-10-15: 30 ug/min via INTRAVENOUS

## 2021-10-15 MED ORDER — PROPOFOL 10 MG/ML IV BOLUS
INTRAVENOUS | Status: AC
  Filled 2021-10-15: qty 20

## 2021-10-15 MED ORDER — CHLORHEXIDINE GLUCONATE 4 % EX LIQD: 60.0000 mL | Freq: Once | CUTANEOUS | Status: DC

## 2021-10-15 MED ORDER — SODIUM CHLORIDE (PF) 0.9 % IJ SOLN
INTRAVENOUS | Status: DC | PRN
  Administered 2021-10-15: 72 mL via INTRAMUSCULAR

## 2021-10-15 MED ORDER — ASPIRIN 81 MG PO TBEC: 81.0000 mg | DELAYED_RELEASE_TABLET | Freq: Every day | ORAL | Status: AC

## 2021-10-15 MED ORDER — ORAL CARE MOUTH RINSE: 15.0000 mL | Freq: Once | OROMUCOSAL | Status: AC

## 2021-10-15 MED ORDER — ONDANSETRON HCL 4 MG/2ML IJ SOLN: 4.0000 mg | Freq: Once | INTRAMUSCULAR | Status: DC | PRN

## 2021-10-15 MED ORDER — AMISULPRIDE (ANTIEMETIC) 5 MG/2ML IV SOLN: 10.0000 mg | Freq: Once | INTRAVENOUS | Status: DC | PRN

## 2021-10-15 MED ORDER — ACETAMINOPHEN 500 MG PO TABS
1000.0000 mg | ORAL_TABLET | Freq: Once | ORAL | Status: AC
  Administered 2021-10-15: 1000 mg via ORAL
  Filled 2021-10-15: qty 2

## 2021-10-15 MED ORDER — PROPOFOL 10 MG/ML IV BOLUS
INTRAVENOUS | Status: DC | PRN
  Administered 2021-10-15: 200 mg via INTRAVENOUS

## 2021-10-15 MED ORDER — LIDOCAINE 2% (20 MG/ML) 5 ML SYRINGE
INTRAMUSCULAR | Status: DC | PRN
  Administered 2021-10-15: 60 mg via INTRAVENOUS

## 2021-10-15 MED ORDER — LACTATED RINGERS IV SOLN: INTRAVENOUS | Status: DC

## 2021-10-15 MED ORDER — MIDAZOLAM HCL 2 MG/2ML IJ SOLN
INTRAMUSCULAR | Status: AC
  Filled 2021-10-15: qty 2

## 2021-10-15 MED ORDER — FENTANYL CITRATE (PF) 250 MCG/5ML IJ SOLN
INTRAMUSCULAR | Status: AC
  Filled 2021-10-15: qty 5

## 2021-10-15 MED ORDER — SODIUM CHLORIDE 0.9 % IV SOLN: INTRAVENOUS | Status: DC

## 2021-10-15 SURGICAL SUPPLY — 49 items
ADH SKN CLS APL DERMABOND .7 (GAUZE/BANDAGES/DRESSINGS) ×5
BAG BANDED W/RUBBER/TAPE 36X54 (MISCELLANEOUS) ×6 IMPLANT
BAG COUNTER SPONGE SURGICOUNT (BAG) ×6 IMPLANT
BAG EQP BAND 135X91 W/RBR TAPE (MISCELLANEOUS) ×5
BAG SPNG CNTER NS LX DISP (BAG) ×5
BANDAGE ACE 4 STERILE (GAUZE/BANDAGES/DRESSINGS) ×2 IMPLANT
BLADE SURG 11 STRL SS (BLADE) ×6 IMPLANT
CATH BEACON 5 .035 65 KMP TIP (CATHETERS) ×2 IMPLANT
CATH OMNI FLUSH .035X70CM (CATHETERS) ×2 IMPLANT
COVER PROBE W GEL 5X96 (DRAPES) ×8 IMPLANT
DCB RANGER 5.0X40 135 (BALLOONS) ×1 IMPLANT
DERMABOND ADVANCED (GAUZE/BANDAGES/DRESSINGS) ×6
DERMABOND ADVANCED .7 DNX12 (GAUZE/BANDAGES/DRESSINGS) ×9 IMPLANT
DEVICE TORQUE H2O (MISCELLANEOUS) ×2 IMPLANT
DEVICE VASC CLSR CELT ART 5 (Vascular Products) ×2 IMPLANT
DRAPE FEMORAL ANGIO 80X135IN (DRAPES) ×6 IMPLANT
DRAPE HALF SHEET 40X57 (DRAPES) ×2 IMPLANT
ELECT REM PT RETURN 9FT ADLT (ELECTROSURGICAL) ×6
ELECTRODE REM PT RTRN 9FT ADLT (ELECTROSURGICAL) ×5 IMPLANT
GAUZE SPONGE 4X4 12PLY STRL (GAUZE/BANDAGES/DRESSINGS) ×6 IMPLANT
GAUZE XEROFORM 5X9 LF (GAUZE/BANDAGES/DRESSINGS) IMPLANT
GLOVE SURG SS PI 7.5 STRL IVOR (GLOVE) ×18 IMPLANT
GOWN STRL REUS W/ TWL LRG LVL3 (GOWN DISPOSABLE) ×10 IMPLANT
GOWN STRL REUS W/ TWL XL LVL3 (GOWN DISPOSABLE) ×5 IMPLANT
GOWN STRL REUS W/TWL LRG LVL3 (GOWN DISPOSABLE) ×12
GOWN STRL REUS W/TWL XL LVL3 (GOWN DISPOSABLE) ×6
GUIDEWIRE ANGLED .035X150CM (WIRE) ×2 IMPLANT
GUIDEWIRE BENTSON (WIRE) ×2 IMPLANT
IV NS IRRIG 3000ML ARTHROMATIC (IV SOLUTION) ×6 IMPLANT
KIT BASIN OR (CUSTOM PROCEDURE TRAY) ×6 IMPLANT
KIT ENCORE 26 ADVANTAGE (KITS) ×2 IMPLANT
KIT TURNOVER KIT B (KITS) ×6 IMPLANT
NS IRRIG 1000ML POUR BTL (IV SOLUTION) ×6 IMPLANT
PACK ENDO MINOR (CUSTOM PROCEDURE TRAY) ×2 IMPLANT
PACK SURGICAL SETUP 50X90 (CUSTOM PROCEDURE TRAY) ×6 IMPLANT
PACK UNIVERSAL I (CUSTOM PROCEDURE TRAY) ×6 IMPLANT
PAD ARMBOARD 7.5X6 YLW CONV (MISCELLANEOUS) ×12 IMPLANT
RANGER DCB 5.0X40 135 (BALLOONS) ×6
SET MICROPUNCTURE 5F STIFF (MISCELLANEOUS) ×6 IMPLANT
SHEATH DESTINATION MP 5FR 45CM (SHEATH) ×2 IMPLANT
SHEATH PINNACLE 5F 10CM (SHEATH) ×2 IMPLANT
STOPCOCK MORSE 400PSI 3WAY (MISCELLANEOUS) ×6 IMPLANT
SYR MEDRAD MARK V 150ML (SYRINGE) ×2 IMPLANT
TAPE CLOTH SURG 6X10 WHT LF (GAUZE/BANDAGES/DRESSINGS) ×2 IMPLANT
TOWEL GREEN STERILE (TOWEL DISPOSABLE) ×12 IMPLANT
TUBING HIGH PRESSURE 120CM (CONNECTOR) ×6 IMPLANT
WATER STERILE IRR 1000ML POUR (IV SOLUTION) ×6 IMPLANT
WIRE BENTSON .035X145CM (WIRE) ×6 IMPLANT
WIRE G V18X300CM (WIRE) ×2 IMPLANT

## 2021-10-15 NOTE — Transfer of Care (Signed)
Immediate Anesthesia Transfer of Care Note  Patient: Michael [name]  Procedure(s) Performed: IRRIGATION AND DEBRIDEMENT RIGHT LEG (Right: Leg Upper) RIGHT LEG ANGIOGRAM (Right: Leg Lower) RIGHT POPLITEAL ARTERY BALLOON ANGIOPLASTY (Right: Leg Lower)  Patient Location: PACU  Anesthesia Type:General  Level of Consciousness: awake, alert  and oriented  Airway & Oxygen Therapy: Patient Spontanous Breathing  Post-op Assessment: Report given to RN and Post -op Vital signs reviewed and stable  Post vital signs: Reviewed and stable  Last Vitals:  Vitals Value Taken Time  BP 133/74 10/15/21 1057  Temp    Pulse 86 10/15/21 1100  Resp 15 10/15/21 1100  SpO2 85 % 10/15/21 1100  Vitals shown include unvalidated device data.  Last Pain:  Vitals:   10/15/21 0749  TempSrc:   PainSc: 0-No pain         Complications: No notable events documented.

## 2021-10-15 NOTE — Anesthesia Postprocedure Evaluation (Signed)
Anesthesia Post Note  Patient: Michael [name]  Procedure(s) Performed: IRRIGATION AND DEBRIDEMENT RIGHT LEG (Right: Leg Upper) RIGHT LEG ANGIOGRAM (Right: Leg Lower) RIGHT POPLITEAL ARTERY BALLOON ANGIOPLASTY (Right: Leg Lower)     Patient location during evaluation: PACU Anesthesia Type: General Level of consciousness: awake Pain management: pain level controlled Vital Signs Assessment: post-procedure vital signs reviewed and stable Respiratory status: spontaneous breathing, nonlabored ventilation, respiratory function stable and patient connected to nasal cannula oxygen Cardiovascular status: blood pressure returned to baseline and stable Postop Assessment: no apparent nausea or vomiting Anesthetic complications: no   No notable events documented.  Last Vitals:  Vitals:   10/15/21 1245 10/15/21 1300  BP: 136/66 126/76  Pulse: 77 78  Resp: (!) 23 16  Temp:  36.7 C  SpO2: 97% 97%    Last Pain:  Vitals:   10/15/21 1300  TempSrc:   PainSc: 0-No pain                 Vannie Hilgert P Tyrell Seifer

## 2021-10-15 NOTE — Interval H&P Note (Signed)
History and Physical Interval Note:  10/15/2021 8:02 AM  Michael [name]  has presented today for surgery, with the diagnosis of Atherosclerosis of native artery of bilateral lower extremities with intermittent claudication.  The various methods of treatment have been discussed with the patient and family. After consideration of risks, benefits and other options for treatment, the patient has consented to  Procedure(s): IRRIGATION AND DEBRIDEMENT RIGHT LEG (Right) RIGHT LEG ANGIOGRAM WITH ANGIOPLASTY (Right) as a surgical intervention.  The patient's history has been reviewed, patient examined, no change in status, stable for surgery.  I have reviewed the patient's chart and labs.  Questions were answered to the patient's satisfaction.     Annamarie Major

## 2021-10-15 NOTE — Op Note (Signed)
Patient name: Michael [name] MRN: [id number] DOB: 1950/10/09 Sex: male  10/15/2021 Pre-operative Diagnosis: Right leg wound Post-operative diagnosis:  Same Surgeon:  Annamarie Major Procedure:   #1: Ultrasound-guided access, left femoral artery   #2: Abdominal aortogram   #3: Drug-coated balloon angioplasty, right superficial femoral artery   #4: I&D of right leg wound (9 x 2 cm) Anesthesia:  General Blood Loss:  minimal Specimens: Wound cultures were sent  Findings: Approximate 50% stenosis of the distal anastomosis successfully dilated with a 5 mm drug-coated balloon with no residual stenosis.  On the wounds, the biofilm was scraped off with a 10 blade.  Cultures were sent.  Indications: This is a 71 year old gentleman who has previously undergone right femoral to above-knee popliteal bypass graft with vein.  He had breakdown of his vein harvest incisions requiring extensive debridement.  He has been having issues with wound healing but has not been improving.  At his last ultrasound, he was found to have a distal anastomotic stenosis.  He comes in today for evaluation of his bypass as well as debridement of his wounds in the operating room.  Procedure:  The patient was identified in the holding area and taken to [place] 16  The patient was then placed supine on the table. general anesthesia was administered.  The patient was prepped and draped in the usual sterile fashion.  A time out was called and antibiotics were administered.  Ultrasound used to evaluate the left common femoral artery which was widely patent and easily compressible.  It was cannulated under ultrasound guidance with a micropuncture needle.  A 018 wire was advanced without resistance followed by placement of micropuncture sheath.  A Bentson wire was then inserted followed by placement of the micropuncture sheath.  An Omni Flush cath was placed to the level of L2 and a abdominal aortogram was performed.  The aortic  bifurcation was then crossed and the catheter was placed in the right common femoral artery and right leg runoff was performed.  No significant stenosis was identified within the aortoiliac section.  The right femoral-popliteal bypass graft was widely patent however at the distal anastomosis, there did appear to be some luminal narrowing that was about 50%.  Because we are here I felt this could be treated.  I then advanced a 5 French 45 cm sheath over the bifurcation.  I passed a V-18 wire into the superficial femoral artery after fully heparinizing the patient.  Once the wire was across the distal anastomosis of the bypass, this was treated with drug-coated balloon angioplasty using a 5 x 40 Ranger balloon for 3 minutes.  Completion imaging revealed resolution of the stenosis.  Wires and catheters were removed and the groin was closed with a Celt.  Attention was then turned towards the leg wounds.  I scraped the biofilm off of the larger wound which measured about 9 x 2 cm.  I got back to healthy bleeding.  I sent the tissue for culture.  There were 2 other areas in the thigh that I also scraped and debrided with a 10 blade.  Debridement included skin and subcutaneous tissue.  I then helped get hemostasis with cautery.  The wounds were dressed with wet to dry dressings.  Patient tolerated the procedure well was extubated taken recovery in stable condition.  There were no immediate complications.   Disposition: To PACU stable.   Theotis Burrow, M.D., [facility] [place] Office: [phone] Pager:  [phone]  

## 2021-10-15 NOTE — Discharge Instructions (Addendum)
   [facility]  Discharge Instructions  Lower Extremity Angiogram; Angioplasty/Stenting  Please refer to the following instructions for your post-procedure care. Your surgeon or physician assistant will discuss any changes with you.  Activity  Avoid lifting more than 8 pounds (1 gallons of milk) for 72 hours (3 days) after your procedure. You may walk as much as you can tolerate. It's OK to drive after 72 hours.  Bathing/Showering  You may shower the day after your procedure. If you have a bandage, you may remove it at 24- 48 hours. Clean your incision site with mild soap and water. Pat the area dry with a clean towel.  Continue wet to dry dressing changes to wounds as you were doing prior to surgery.   Diet  Resume your pre-procedure diet. There are no special food restrictions following this procedure. All patients with peripheral vascular disease should follow a low fat/low cholesterol diet. In order to heal from your surgery, it is CRITICAL to get adequate nutrition. Your body requires vitamins, minerals, and protein. Vegetables are the best source of vitamins and minerals. Vegetables also provide the perfect balance of protein. Processed food has little nutritional value, so try to avoid this.  Medications  Resume taking all of your medications unless your doctor tells you not to. If your incision is causing pain, you may take over-the-counter pain relievers such as acetaminophen (Tylenol)  Follow Up  Follow up will be arranged at the time of your procedure. You may have an office visit scheduled or may be scheduled for surgery. Ask your surgeon if you have any questions.  Please call us immediately for any of the following conditions: Severe or worsening pain your legs or feet at rest or with walking. Increased pain, redness, drainage at your groin puncture site. Fever of 101 degrees or higher. If you have any mild or slow bleeding from your  puncture site: lie down, apply firm constant pressure over the area with a piece of gauze or a clean wash cloth for 30 minutes- no peeking!, call 911 right away if you are still bleeding after 30 minutes, or if the bleeding is heavy and unmanageable.  Reduce your risk factors of vascular disease:  Stop smoking. If you would like help call [facility] at 1-800-QUIT-NOW 442-696-1371) or [place] at 570-526-2534. Manage your cholesterol Maintain a desired weight Control your diabetes Keep your blood pressure down  If you have any questions, please call the office at [phone]

## 2021-10-15 NOTE — Anesthesia Procedure Notes (Signed)
Procedure Name: LMA Insertion Date/Time: 10/15/2021 8:40 AM  Performed by: Griffin Dakin, CRNAPre-anesthesia Checklist: Patient identified, Emergency Drugs available, Suction available, Patient being monitored and Timeout performed Patient Re-evaluated:Patient Re-evaluated prior to induction Oxygen Delivery Method: Circle system utilized Preoxygenation: Pre-oxygenation with 100% oxygen Induction Type: IV induction Ventilation: Mask ventilation without difficulty LMA: LMA inserted LMA Size: 4.0 Number of attempts: 1 Placement Confirmation: positive ETCO2 and breath sounds checked- equal and bilateral Tube secured with: Tape Dental Injury: Teeth and Oropharynx as per pre-operative assessment

## 2021-10-15 NOTE — Telephone Encounter (Signed)
-----   Message from Gabriel Earing, [place] sent at 10/15/2021 10:45 AM EDT ----- S/p angiogram 8/16.  F/u with Dr. Trula Slade in 2 weeks per WB.  Thanks

## 2021-10-15 NOTE — Telephone Encounter (Signed)
Appt has been scheduled.

## 2021-10-16 ENCOUNTER — Encounter (HOSPITAL_COMMUNITY): Payer: Self-pay | Admitting: Surgery

## 2021-10-17 DIAGNOSIS — S71101A Unspecified open wound, right thigh, initial encounter: Secondary | ICD-10-CM | POA: Diagnosis not present

## 2021-10-17 DIAGNOSIS — E119 Type 2 diabetes mellitus without complications: Secondary | ICD-10-CM | POA: Diagnosis not present

## 2021-10-17 DIAGNOSIS — Z4801 Encounter for change or removal of surgical wound dressing: Secondary | ICD-10-CM | POA: Diagnosis not present

## 2021-10-17 DIAGNOSIS — E1165 Type 2 diabetes mellitus with hyperglycemia: Secondary | ICD-10-CM | POA: Diagnosis not present

## 2021-10-19 LAB — AEROBIC CULTURE W GRAM STAIN (SUPERFICIAL SPECIMEN): Gram Stain: NONE SEEN

## 2021-10-20 ENCOUNTER — Telehealth: Payer: Self-pay

## 2021-10-20 DIAGNOSIS — E1151 Type 2 diabetes mellitus with diabetic peripheral angiopathy without gangrene: Secondary | ICD-10-CM | POA: Diagnosis not present

## 2021-10-20 DIAGNOSIS — B351 Tinea unguium: Secondary | ICD-10-CM | POA: Diagnosis not present

## 2021-10-20 DIAGNOSIS — L851 Acquired keratosis [keratoderma] palmaris et plantaris: Secondary | ICD-10-CM | POA: Diagnosis not present

## 2021-10-20 LAB — AEROBIC/ANAEROBIC CULTURE W GRAM STAIN (SURGICAL/DEEP WOUND)
Culture: NO GROWTH
Gram Stain: NONE SEEN

## 2021-10-20 LAB — ANAEROBIC CULTURE W GRAM STAIN: Gram Stain: NONE SEEN

## 2021-10-20 NOTE — Telephone Encounter (Signed)
Pt called stating that he'd been using [place] on his wound, but was unsure if he could continue it post procedure.  Reviewed pt's chart, spoke with Dr Trula Slade, returned call for clarification, two identifiers used. Informed pt that Dr Trula Slade advised that he continue the [facility] if he felt that it was helpful. Confirmed understanding.

## 2021-10-27 ENCOUNTER — Ambulatory Visit (INDEPENDENT_AMBULATORY_CARE_PROVIDER_SITE_OTHER): Payer: Medicare Other | Admitting: Surgery

## 2021-10-27 ENCOUNTER — Encounter: Payer: Self-pay | Admitting: Surgery

## 2021-10-27 VITALS — BP 132/83 | HR 94 | Temp 97.9°F | Resp 20 | Ht 72.0 in | Wt 221.0 lb

## 2021-10-27 DIAGNOSIS — T8149XA Infection following a procedure, other surgical site, initial encounter: Secondary | ICD-10-CM

## 2021-10-27 MED ORDER — HYDROMORPHONE HCL 2 MG PO TABS: 2.0000 mg | ORAL_TABLET | ORAL | 0 refills | Status: DC | PRN

## 2021-10-27 MED ORDER — SULFAMETHOXAZOLE-TRIMETHOPRIM 800-160 MG PO TABS: 1.0000 | ORAL_TABLET | Freq: Two times a day (BID) | ORAL | 0 refills | Status: DC

## 2021-10-27 NOTE — Addendum Note (Signed)
Addended by: Serafina Mitchell on: 10/27/2021 02:24 PM   Modules accepted: Orders

## 2021-10-27 NOTE — Progress Notes (Addendum)
Patient name: Michael [name] MRN: [id number] DOB: November 18, 1950 Sex: male  REASON FOR VISIT:     Post op  HISTORY OF PRESENT ILLNESS:   Michael [name] is a 71 y.o. male who is status post right femoral to above-knee popliteal bypass graft with vein on 04/06/2021 for claudication.  This was complicated by vein harvest site infection requiring debridement on 05/02/2021 and 05/05/2021.  He went back to the operating room on 10/15/2021 for evaluation of his bypass.  He had a approximate 50% stenosis of the distal anastomosis that was successfully dilated with a 5 x 40 drug-coated balloon.  I also debrided his residual wound.  He is back today for a wound check.  He has developed worsening appearance of a new wound on his lower leg  CURRENT MEDICATIONS:    Current Outpatient Medications  Medication Sig Dispense Refill   ACCU-CHEK AVIVA PLUS test strip AS DIRECTED SEVEN TIMES A DAY IN VITRO 90 DAYS  4   acetaminophen (TYLENOL) 500 MG tablet Take 1,000 mg by mouth every 6 (six) hours as needed for moderate pain.     amLODipine (NORVASC) 10 MG tablet Take 10 mg by mouth daily.     aspirin EC 81 MG tablet Take 1 tablet (81 mg total) by mouth daily.     atorvastatin (LIPITOR) 40 MG tablet Take 40 mg by mouth at bedtime.  5   benazepril (LOTENSIN) 40 MG tablet Take 40 mg by mouth daily.     clonazePAM (KLONOPIN) 1 MG tablet Take 2 mg by mouth at bedtime as needed (sleep).     clopidogrel (PLAVIX) 75 MG tablet TAKE 1 TABLET BY MOUTH EVERY DAY WITH BREAKFAST 90 tablet 3   Continuous Blood Gluc Receiver (FREESTYLE LIBRE 14 DAY READER) DEVI use to monitor blood sugar     dapagliflozin propanediol (FARXIGA) 5 MG TABS tablet Take 5 mg by mouth daily.     diphenhydrAMINE (BENADRYL) 25 MG tablet Take 25 mg by mouth at bedtime as needed for sleep.     fluticasone (FLONASE) 50 MCG/ACT nasal spray Place 2 sprays into both nostrils at bedtime as needed for allergies (before uses CPAP  if needed).     folic acid (FOLVITE) 1 MG tablet Take 1 mg by mouth daily.     HUMALOG KWIKPEN 100 UNIT/ML KwikPen Inject 30 Units into the skin 2 (two) times daily.     hydrochlorothiazide (HYDRODIURIL) 25 MG tablet Take 1 tablet (25 mg total) by mouth daily. Please make overdue appt with Dr. Irish Lack before anymore refills. 2nd attempt 15 tablet 0   HYDROmorphone (DILAUDID) 2 MG tablet Take 1 tablet (2 mg total) by mouth every 4 (four) hours as needed for severe pain. 40 tablet 0   insulin glargine (LANTUS) 100 UNIT/ML injection Inject 20 Units into the skin at bedtime.     metFORMIN (GLUCOPHAGE-XR) 500 MG 24 hr tablet Take 2,000 mg by mouth at bedtime.     oxyCODONE-acetaminophen (PERCOCET/ROXICET) 5-325 MG tablet Take 1 tablet by mouth every 6 (six) hours as needed for severe pain. 30 tablet 0   solifenacin (VESICARE) 5 MG tablet Take 5 mg by mouth every evening.     sulfamethoxazole-trimethoprim (BACTRIM DS) 800-160 MG tablet Take 1 tablet by mouth 2 (two) times daily. 28 tablet 0   tamsulosin (FLOMAX) 0.4 MG CAPS capsule Take 0.4 mg by mouth at bedtime.  5   traZODone (DESYREL) 50 MG tablet Take 50 mg by mouth at bedtime as needed  for sleep.     trolamine salicylate (ASPERCREME) 10 % cream Apply 1 Application topically as needed for muscle pain.     cefadroxil (DURICEF) 500 MG capsule Take 2 capsules (1,000 mg total) by mouth 2 (two) times daily. (Patient not taking: Reported on 08/25/2021) 56 capsule 0   No current facility-administered medications for this visit.    REVIEW OF SYSTEMS:   '[X]'$  denotes positive finding, '[ ]'$  denotes negative finding Cardiac  Comments:  Chest pain or chest pressure:    Shortness of breath upon exertion:    Short of breath when lying flat:    Irregular heart rhythm:    Constitutional    Fever or chills:      PHYSICAL EXAM:   Vitals:   10/27/21 1337  BP: 132/83  Pulse: 94  Resp: 20  Temp: 97.9 F (36.6 C)  SpO2: 96%  Weight: 221 lb (100.2 kg)   Height: 6' (1.829 m)    GENERAL: The patient is a well-nourished male, in no acute distress. The vital signs are documented above. CARDIOVASCULAR: There is a regular rate and rhythm. PULMONARY: Non-labored respirations Triphasic right dorsalis pedis and posterior tibial Doppler signals       STUDIES:       MEDICAL ISSUES:   A wound on the right lower leg has clearly gotten worse.  There is cellulitis present.  I am starting him on antibiotics and placing him in [employer] [facility].  He will follow-up in 1 to 2 weeks  Dilaudid '2mg'$  x 30 given  Leia Alf, MD, FACS [facility] [phone] Pager [phone]

## 2021-11-02 DIAGNOSIS — Z794 Long term (current) use of insulin: Secondary | ICD-10-CM | POA: Diagnosis not present

## 2021-11-02 DIAGNOSIS — E119 Type 2 diabetes mellitus without complications: Secondary | ICD-10-CM | POA: Diagnosis not present

## 2021-11-02 DIAGNOSIS — E1165 Type 2 diabetes mellitus with hyperglycemia: Secondary | ICD-10-CM | POA: Diagnosis not present

## 2021-11-04 ENCOUNTER — Other Ambulatory Visit: Payer: Self-pay

## 2021-11-04 ENCOUNTER — Telehealth: Payer: Self-pay

## 2021-11-04 DIAGNOSIS — I739 Peripheral vascular disease, unspecified: Secondary | ICD-10-CM

## 2021-11-04 DIAGNOSIS — T8149XA Infection following a procedure, other surgical site, initial encounter: Secondary | ICD-10-CM

## 2021-11-04 NOTE — Progress Notes (Signed)
Encounter opened in Error

## 2021-11-04 NOTE — Telephone Encounter (Signed)
I called patient to find out which [facility] he used in the past for services in [place].  He said he used [facility] [place] [facility].  An order was faxed to them for weekly Unna Boot dressing changes, per Dr. Stephens Shire Encounter sheet.

## 2021-11-05 ENCOUNTER — Other Ambulatory Visit: Payer: Self-pay | Admitting: Vascular Surgery

## 2021-11-05 ENCOUNTER — Telehealth: Payer: Self-pay

## 2021-11-05 ENCOUNTER — Ambulatory Visit (INDEPENDENT_AMBULATORY_CARE_PROVIDER_SITE_OTHER): Payer: Medicare Other

## 2021-11-05 DIAGNOSIS — I739 Peripheral vascular disease, unspecified: Secondary | ICD-10-CM

## 2021-11-05 DIAGNOSIS — I70738 Atherosclerosis of other type of bypass graft(s) of the right leg with ulceration of other part of lower leg: Secondary | ICD-10-CM | POA: Diagnosis not present

## 2021-11-05 DIAGNOSIS — M1991 Primary osteoarthritis, unspecified site: Secondary | ICD-10-CM | POA: Insufficient documentation

## 2021-11-05 NOTE — Progress Notes (Signed)
Patient present in office today for Unilateral Unna boot change RLE. Today the wound located on the shin is slowly but surely healing. He reports foot swelling, soreness and tenderness at the site with minimal drainage. Today, the unna boot was replaced. Patient tolerated Unna boot wrapping well & advised he will need to return to our office for the next week for Unna boot change and OV with Dr. Trula Slade. Patient voiced his understanding.       Supervising MD: Saverio Danker A.,CMA

## 2021-11-05 NOTE — Telephone Encounter (Signed)
Pt called to see when home health is able to come out to change his unna boot. Per [facility] they can not come until thurs or fri of this week. We have added pt on to schedule to come in today for the unna boot change and he will return next Monday for another change. I have let [facility] know they do not need to go see him until 9/18 to change the unna boot. The case manager is aware.

## 2021-11-06 DIAGNOSIS — Z23 Encounter for immunization: Secondary | ICD-10-CM | POA: Diagnosis not present

## 2021-11-10 ENCOUNTER — Encounter: Payer: Medicare Other | Admitting: Surgery

## 2021-11-10 ENCOUNTER — Ambulatory Visit (INDEPENDENT_AMBULATORY_CARE_PROVIDER_SITE_OTHER): Payer: Medicare Other | Admitting: Physician Assistant

## 2021-11-10 VITALS — BP 140/83 | HR 68 | Temp 97.6°F | Resp 16 | Ht 72.0 in | Wt 216.0 lb

## 2021-11-10 DIAGNOSIS — I70738 Atherosclerosis of other type of bypass graft(s) of the right leg with ulceration of other part of lower leg: Secondary | ICD-10-CM

## 2021-11-10 DIAGNOSIS — I739 Peripheral vascular disease, unspecified: Secondary | ICD-10-CM

## 2021-11-10 MED ORDER — HYDROMORPHONE HCL 2 MG PO TABS: 2.0000 mg | ORAL_TABLET | ORAL | 0 refills | Status: DC | PRN

## 2021-11-10 NOTE — Progress Notes (Signed)
Office Note     CC:  follow up Requesting Provider:  Stamey, Girtha Rm, FNP  HPI: Michael [name] is a 71 y.o. (29-Dec-1950) male who presents status post right femoral to above-the-knee popliteal bypass with vein on 04/06/2021 for claudication.  This was complicated by vein harvest site infection requiring debridement on 3/3 and 3/6.  He went back to the operating room on 10/15/2021 for for balloon angioplasty of the distal anastomosis of the bypass.  He is using wet-to-dry with honey on the residual vein harvest site wound of the distal thigh.  He has been in [name] for the past 2 weeks for a right lower shin wound.  He believes the wound is slowly improving.  Past Medical History:  Diagnosis Date   Allergy    Anxiety    Arthritis    psoriatic arthritis   Diabetes mellitus    GERD (gastroesophageal reflux disease)    Hyperlipidemia    Hypertension    PAD (peripheral artery disease) ([place])    a. s/p prior LE stenting 2012, 03/2017 - followed by [facility].   Prostate cancer ([place])    Sleep apnea 2016   wears CPAP sometimes    Past Surgical History:  Procedure Laterality Date   ABDOMINAL AORTOGRAM N/A 09/01/2016   Procedure: Abdominal Aortogram;  Surgeon: Serafina Mitchell, MD;  Location: [place] [facility];  Service: Cardiovascular;  Laterality: N/A;   ABDOMINAL AORTOGRAM W/LOWER EXTREMITY N/A 03/16/2017   Procedure: ABDOMINAL AORTOGRAM W/LOWER EXTREMITY;  Surgeon: Serafina Mitchell, MD;  Location: [place] [facility];  Service: Cardiovascular;  Laterality: N/A;   ANGIOPLASTY Right 01/30/2021   Procedure: BALLOON ANGIOPLASTY;  Surgeon: Serafina Mitchell, MD;  Location: [place];  Service: Vascular;  Laterality: Right;   ANGIOPLASTY Right 10/15/2021   Procedure: RIGHT SUPERFICIAL FEMORAL ARTERY BALLOON ANGIOPLASTY;  Surgeon: Serafina Mitchell, MD;  Location: [place];  Service: Vascular;  Laterality: Right;   ANGIOPLASTY / STENTING FEMORAL  11/05/2010   Left SFA stent   AORTOGRAM N/A 10/15/2021    Procedure: ABDOMINAL AORTOGRAM;  Surgeon: Serafina Mitchell, MD;  Location: [place];  Service: Vascular;  Laterality: N/A;   APPLICATION OF WOUND VAC Right 04/16/2021   Procedure: APPLICATION OF WOUND VAC;  Surgeon: Serafina Mitchell, MD;  Location: [place];  Service: Vascular;  Laterality: Right;   APPLICATION OF WOUND VAC Right 05/02/2021   Procedure: APPLICATION OF WOUND VAC;  Surgeon: Angelia Mould, MD;  Location: [place];  Service: Vascular;  Laterality: Right;   APPLICATION OF WOUND VAC Right 05/05/2021   Procedure: WOUND VAC CHANGE RIGHT GROIN, APPLICATION OF WOUND VAC PROXIMAL AND DISTAL MEDIAL RIGHT THIGH;  Surgeon: Cherre Robins, MD;  Location: [place];  Service: Vascular;  Laterality: Right;   ENDARTERECTOMY FEMORAL Right 04/16/2021   Procedure: RIGHT FEMORAL ENDARTERECTOMY;  Surgeon: Serafina Mitchell, MD;  Location: [place];  Service: Vascular;  Laterality: Right;   EYE SURGERY  2010   bilateral cataract   FEMORAL-POPLITEAL BYPASS GRAFT Right 04/16/2021   Procedure: RIGHT FEMORAL-POPLITEAL BYPASS USING VEIN;  Surgeon: Serafina Mitchell, MD;  Location: [place];  Service: Vascular;  Laterality: Right;   GROIN DEBRIDEMENT Right 05/05/2021   Procedure: IRRIGATION AND DEBRIDEMENT RIGHT GROIN AND RIGHT THIGH;  Surgeon: Cherre Robins, MD;  Location: [place];  Service: Vascular;  Laterality: Right;   I & D EXTREMITY Right 05/02/2021   Procedure: IRRIGATION AND DEBRIDEMENT RIGHT LEG;  Surgeon: Angelia Mould, MD;  [facility];  Service: Vascular;  Laterality: Right;   I & D EXTREMITY Right 10/15/2021   Procedure: IRRIGATION AND DEBRIDEMENT RIGHT LEG;  Surgeon: Serafina Mitchell, MD;  [facility];  Service: Vascular;  Laterality: Right;   KNEE ARTHROSCOPY     LOWER EXTREMITY ANGIOGRAM Right 01/30/2021   Procedure: LOWER EXTREMITY ANGIOGRAM WITH ARTERIOGRAM OF RIGHT LOWER EXTREMITY;  Surgeon: Serafina Mitchell, MD;  [facility];  Service: Vascular;  Laterality: Right;   LOWER  EXTREMITY ANGIOGRAPHY N/A 09/01/2016   Procedure: Lower Extremity Angiography;  Surgeon: Serafina Mitchell, MD;  Location: [place] [facility];  Service: Cardiovascular;  Laterality: N/A;   PERIPHERAL VASCULAR ATHERECTOMY Right 09/01/2016   Procedure: Peripheral Vascular Atherectomy;  Surgeon: Serafina Mitchell, MD;  Location: [place] [facility];  Service: Cardiovascular;  Laterality: Right;  superficial femoral   PERIPHERAL VASCULAR INTERVENTION Right 03/16/2017   Procedure: PERIPHERAL VASCULAR INTERVENTION;  Surgeon: Serafina Mitchell, MD;  Location: [place] [facility];  Service: Cardiovascular;  Laterality: Right;  superficicial femoral   ROTATOR CUFF REPAIR     1990   ROTATOR CUFF REPAIR Bilateral 2017   ULTRASOUND GUIDANCE FOR VASCULAR ACCESS Left 01/30/2021   Procedure: ULTRASOUND GUIDANCE FOR VASCULAR ACCESS;  Surgeon: Serafina Mitchell, MD;  [facility];  Service: Vascular;  Laterality: Left;   ULTRASOUND GUIDANCE FOR VASCULAR ACCESS Left 10/15/2021   Procedure: ULTRASOUND GUIDANCE FOR VASCULAR ACCESS;  Surgeon: Serafina Mitchell, MD;  [facility];  Service: Vascular;  Laterality: Left;   VEIN HARVEST Right 04/16/2021   Procedure: [place];  Surgeon: Serafina Mitchell, MD;  [facility];  Service: Vascular;  Laterality: Right;    Social History   Socioeconomic History   Marital status: Married    Spouse name: Not on file   Number of children: Not on file   Years of education: Not on file   Highest education level: Not on file  Occupational History   Not on file  Tobacco Use   Smoking status: Never    Passive exposure: Never   Smokeless tobacco: Former    Quit date: 10/26/1981  Vaping Use   Vaping Use: Never used  Substance and Sexual Activity   Alcohol use: Yes    Comment: occasional drink, maybe a glass of wine once every 6 months   Drug use: No   Sexual activity: Not on file  Other Topics Concern   Not on file  Social History Narrative    Not on file   Social Determinants of Health   Financial Resource Strain: Not on file  Food Insecurity: Not on file  Transportation Needs: Not on file  Physical Activity: Not on file  Stress: Not on file  Social Connections: Not on file  Intimate Partner Violence: Not on file    Family History  Problem Relation Age of Onset   Hyperlipidemia Mother    Hypertension Mother    Heart disease Father    Deep vein thrombosis Father    Hyperlipidemia Father    Hypertension Father    Heart attack Father    Peripheral vascular disease Father    Cancer Sister     Current Outpatient Medications  Medication Sig Dispense Refill   ACCU-CHEK AVIVA PLUS test strip AS DIRECTED SEVEN TIMES A DAY IN VITRO 90 DAYS  4   acetaminophen (TYLENOL) 500 MG tablet Take 1,000 mg by mouth every 6 (six) hours as needed for  moderate pain.     amLODipine (NORVASC) 10 MG tablet Take 10 mg by mouth daily.     aspirin EC 81 MG tablet Take 1 tablet (81 mg total) by mouth daily.     atorvastatin (LIPITOR) 40 MG tablet Take 40 mg by mouth at bedtime.  5   benazepril (LOTENSIN) 40 MG tablet Take 40 mg by mouth daily.     cefadroxil (DURICEF) 500 MG capsule Take 2 capsules (1,000 mg total) by mouth 2 (two) times daily. 56 capsule 0   clonazePAM (KLONOPIN) 1 MG tablet Take 2 mg by mouth at bedtime as needed (sleep).     clopidogrel (PLAVIX) 75 MG tablet TAKE 1 TABLET BY MOUTH EVERY DAY WITH BREAKFAST 90 tablet 3   Continuous Blood Gluc Receiver (FREESTYLE LIBRE 14 DAY READER) DEVI use to monitor blood sugar     dapagliflozin propanediol (FARXIGA) 5 MG TABS tablet Take 5 mg by mouth daily.     diphenhydrAMINE (BENADRYL) 25 MG tablet Take 25 mg by mouth at bedtime as needed for sleep.     fluticasone (FLONASE) 50 MCG/ACT nasal spray Place 2 sprays into both nostrils at bedtime as needed for allergies (before uses CPAP if needed).     folic acid (FOLVITE) 1 MG tablet Take 1 mg by mouth daily.     HUMALOG KWIKPEN 100  UNIT/ML KwikPen Inject 30 Units into the skin 2 (two) times daily.     hydrochlorothiazide (HYDRODIURIL) 25 MG tablet Take 1 tablet (25 mg total) by mouth daily. Please make overdue appt with Dr. Irish Lack before anymore refills. 2nd attempt 15 tablet 0   insulin glargine (LANTUS) 100 UNIT/ML injection Inject 20 Units into the skin at bedtime.     metFORMIN (GLUCOPHAGE-XR) 500 MG 24 hr tablet Take 2,000 mg by mouth at bedtime.     solifenacin (VESICARE) 5 MG tablet Take 5 mg by mouth every evening.     sulfamethoxazole-trimethoprim (BACTRIM DS) 800-160 MG tablet Take 1 tablet by mouth 2 (two) times daily. 28 tablet 0   tamsulosin (FLOMAX) 0.4 MG CAPS capsule Take 0.4 mg by mouth at bedtime.  5   traZODone (DESYREL) 50 MG tablet Take 50 mg by mouth at bedtime as needed for sleep.     trolamine salicylate (ASPERCREME) 10 % cream Apply 1 Application topically as needed for muscle pain.     HYDROmorphone (DILAUDID) 2 MG tablet Take 1 tablet (2 mg total) by mouth every 4 (four) hours as needed for severe pain. 30 tablet 0   No current facility-administered medications for this visit.    Allergies  Allergen Reactions   Demerol Anaphylaxis   Lactose Intolerance (Gi) Anaphylaxis   Oxycodone Anxiety and Other (See Comments)    Suicidal ideation   Penicillins Anaphylaxis and Other (See Comments)    Tolerates cefepime    Actos [Pioglitazone] Nausea Only   Exenatide Nausea Only and Other (See Comments)   Glimepiride Other (See Comments)    Edema in ankles    Nabumetone Nausea Only and Other (See Comments)   Victoza [Liraglutide] Nausea Only    Shaking   Fentanyl Anxiety    Jittery    Hydrocodone Anxiety    Suicidal ideation     REVIEW OF SYSTEMS:   '[X]'$  denotes positive finding, '[ ]'$  denotes negative finding Cardiac  Comments:  Chest pain or chest pressure:    Shortness of breath upon exertion:    Short of breath when lying flat:    Irregular heart  rhythm:        Vascular    Pain in  calf, thigh, or hip brought on by ambulation:    Pain in feet at night that wakes you up from your sleep:     Blood clot in your veins:    Leg swelling:         Pulmonary    Oxygen at home:    Productive cough:     Wheezing:         Neurologic    Sudden weakness in arms or legs:     Sudden numbness in arms or legs:     Sudden onset of difficulty speaking or slurred speech:    Temporary loss of vision in one eye:     Problems with dizziness:         Gastrointestinal    Blood in stool:     Vomited blood:         Genitourinary    Burning when urinating:     Blood in urine:        Psychiatric    Major depression:         Hematologic    Bleeding problems:    Problems with blood clotting too easily:        Skin    Rashes or ulcers:        Constitutional    Fever or chills:      PHYSICAL EXAMINATION:  Vitals:   11/10/21 0843  BP: (!) 140/83  Pulse: 68  Resp: 16  Temp: 97.6 F (36.4 C)  TempSrc: Temporal  SpO2: 100%  Weight: 216 lb (98 kg)  Height: 6' (1.829 m)    General:  WDWN in NAD; vital signs documented above Gait: Not observed HENT: WNL, normocephalic Pulmonary: normal non-labored breathing , without Rales, rhonchi,  wheezing Cardiac: regular HR Abdomen: soft, NT, no masses Skin: without rashes Vascular Exam/Pulses:  Right Left  DP 1+ (weak) 2+ (normal)  PT 2+ (normal) 1+ (weak)   Extremities: Distal thigh residual wound with healthy granulation tissue and no purulence; right ankle wound pictured below Musculoskeletal: no muscle wasting or atrophy  Neurologic: A&O X 3;  No focal weakness or paresthesias are detected Psychiatric:  The pt has Normal affect.     ASSESSMENT/PLAN:: 71 y.o. male here for wound check  -Bilateral lower extremities are well-perfused with a 2+ palpable right PT and 2+ palpable left DP -Minimal improvement of right lower shin wound.  Continue Unna boot for another 2 weeks.  Next week Unna boot change should be  performed by home health. -Continue current wound care to the distal thigh wound and remaining groin wound -Patient knows to call/return office sooner with any questions or concerns. -2 mg Dilaudid prescription was refilled  Michael Ligas, PA-C [facility] [phone]  Clinic MD:   Trula Slade

## 2021-11-17 ENCOUNTER — Telehealth: Payer: Self-pay

## 2021-11-17 DIAGNOSIS — T827XXD Infection and inflammatory reaction due to other cardiac and vascular devices, implants and grafts, subsequent encounter: Secondary | ICD-10-CM | POA: Diagnosis not present

## 2021-11-17 DIAGNOSIS — Z794 Long term (current) use of insulin: Secondary | ICD-10-CM | POA: Diagnosis not present

## 2021-11-17 DIAGNOSIS — Z4800 Encounter for change or removal of nonsurgical wound dressing: Secondary | ICD-10-CM | POA: Diagnosis not present

## 2021-11-17 DIAGNOSIS — L97815 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: Secondary | ICD-10-CM | POA: Diagnosis not present

## 2021-11-17 DIAGNOSIS — Z7982 Long term (current) use of aspirin: Secondary | ICD-10-CM | POA: Diagnosis not present

## 2021-11-17 DIAGNOSIS — Z7902 Long term (current) use of antithrombotics/antiplatelets: Secondary | ICD-10-CM | POA: Diagnosis not present

## 2021-11-17 DIAGNOSIS — Z79891 Long term (current) use of opiate analgesic: Secondary | ICD-10-CM | POA: Diagnosis not present

## 2021-11-17 DIAGNOSIS — I10 Essential (primary) hypertension: Secondary | ICD-10-CM | POA: Diagnosis not present

## 2021-11-17 DIAGNOSIS — E1151 Type 2 diabetes mellitus with diabetic peripheral angiopathy without gangrene: Secondary | ICD-10-CM | POA: Diagnosis not present

## 2021-11-17 DIAGNOSIS — F419 Anxiety disorder, unspecified: Secondary | ICD-10-CM | POA: Diagnosis not present

## 2021-11-17 DIAGNOSIS — Z7984 Long term (current) use of oral hypoglycemic drugs: Secondary | ICD-10-CM | POA: Diagnosis not present

## 2021-11-17 NOTE — Telephone Encounter (Signed)
Received call from [facility] Nurse with [facility] requesting a change in order for dressing changes. Cassandra stated that she does not recommend the unna boot to R LE due to the pt not having any apparent edema, the wound has slough around the edges but observed new tissue in wound bed. She has recommended daily calcium alginate and xeroform dressing, then wrapped with kling and coban. She stated she would teach his wife how to change this dressing on the days that they will not see the pt. They will see the pt twice weekly for two weeks then once a week for six weeks. Pt confirmed appointment for next week and nurse was advised that the pt's wounds would be assessed and any changes to the order would be sent to her. She and patient verbalized understanding and agreed with this plan.

## 2021-11-24 ENCOUNTER — Encounter: Payer: Self-pay | Admitting: Surgery

## 2021-11-24 ENCOUNTER — Ambulatory Visit: Payer: Medicare Other | Admitting: Surgery

## 2021-11-24 VITALS — BP 131/80 | HR 102 | Temp 97.9°F | Resp 20 | Ht 72.0 in | Wt 215.0 lb

## 2021-11-24 DIAGNOSIS — I7025 Atherosclerosis of native arteries of other extremities with ulceration: Secondary | ICD-10-CM

## 2021-11-24 MED ORDER — HYDROMORPHONE HCL 2 MG PO TABS: 2.0000 mg | ORAL_TABLET | ORAL | 0 refills | Status: DC | PRN

## 2021-11-24 NOTE — Progress Notes (Signed)
[facility]  Patient name: Michael [name] MRN: [id number] DOB: Feb 26, 1951 Sex: male   REASON FOR VISIT:    Follow up  HISOTRY OF PRESENT ILLNESS:    Michael [name] is a 71 y.o. male who has undergone the following procedures:  11/05/2010: Stent, right superficial femoral artery (claudication) 09/01/2016: Atherectomy and drug-coated balloon angioplasty, right superficial femoral artery (in-stent stenosis 03/16/2017: Stent, right superficial femoral artery (in-stent stenosis) 01/30/2021: Drug-coated balloon angioplasty, right superficial femoral artery (in-stent stenosis) 04/16/2021: Right femoral to above-knee popliteal artery bypass graft with saphenous vein (claudication) 05/02/2021: Excision and debridement of vein harvest incisions 05/05/2021: I&D vein harvest incisions 10/15/2021: Drug-coated balloon angioplasty of the distal femoral-popliteal bypass graft with repeat I&D of the right leg wound  He is getting wound care for a new wound on his right shin.  PAST MEDICAL HISTORY:   Past Medical History:  Diagnosis Date   Allergy    Anxiety    Arthritis    psoriatic arthritis   Diabetes mellitus    GERD (gastroesophageal reflux disease)    Hyperlipidemia    Hypertension    PAD (peripheral artery disease) (HCC)    a. s/p prior LE stenting 2012, 03/2017 - followed by [facility].   Prostate cancer ([place])    Sleep apnea 2016   wears CPAP sometimes     FAMILY HISTORY:   Family History  Problem Relation Age of Onset   Hyperlipidemia Mother    Hypertension Mother    Heart disease Father    Deep vein thrombosis Father    Hyperlipidemia Father    Hypertension Father    Heart attack Father    Peripheral vascular disease Father    Cancer Sister     SOCIAL HISTORY:   Social History   Tobacco Use   Smoking status: Never    Passive exposure: Never   Smokeless tobacco: Former    Quit date: 10/26/1981  Substance  Use Topics   Alcohol use: Yes    Comment: occasional drink, maybe a glass of wine once every 6 months     ALLERGIES:   Allergies  Allergen Reactions   Demerol Anaphylaxis   Lactose Intolerance (Gi) Anaphylaxis   Oxycodone Anxiety and Other (See Comments)    Suicidal ideation   Penicillins Anaphylaxis and Other (See Comments)    Tolerates cefepime    Actos [Pioglitazone] Nausea Only   Exenatide Nausea Only and Other (See Comments)   Glimepiride Other (See Comments)    Edema in ankles    Nabumetone Nausea Only and Other (See Comments)   Victoza [Liraglutide] Nausea Only    Shaking   Fentanyl Anxiety    Jittery    Hydrocodone Anxiety    Suicidal ideation     CURRENT MEDICATIONS:   Current Outpatient Medications  Medication Sig Dispense Refill   ACCU-CHEK AVIVA PLUS test strip AS DIRECTED SEVEN TIMES A DAY IN VITRO 90 DAYS  4   acetaminophen (TYLENOL) 500 MG tablet Take 1,000 mg by mouth every 6 (six) hours as needed for moderate pain.     amLODipine (NORVASC) 10 MG tablet Take 10 mg by mouth daily.     aspirin EC 81 MG tablet Take 1 tablet (81 mg total) by mouth daily.     atorvastatin (LIPITOR) 40 MG tablet Take 40 mg by mouth at bedtime.  5   benazepril (LOTENSIN) 40 MG tablet Take 40 mg by mouth daily.     cefadroxil (DURICEF) 500 MG  capsule Take 2 capsules (1,000 mg total) by mouth 2 (two) times daily. 56 capsule 0   clonazePAM (KLONOPIN) 1 MG tablet Take 2 mg by mouth at bedtime as needed (sleep).     clopidogrel (PLAVIX) 75 MG tablet TAKE 1 TABLET BY MOUTH EVERY DAY WITH BREAKFAST 90 tablet 3   Continuous Blood Gluc Receiver (FREESTYLE LIBRE 14 DAY READER) DEVI use to monitor blood sugar     dapagliflozin propanediol (FARXIGA) 5 MG TABS tablet Take 5 mg by mouth daily.     diphenhydrAMINE (BENADRYL) 25 MG tablet Take 25 mg by mouth at bedtime as needed for sleep.     fluticasone (FLONASE) 50 MCG/ACT nasal spray Place 2 sprays into both nostrils at bedtime as needed  for allergies (before uses CPAP if needed).     folic acid (FOLVITE) 1 MG tablet Take 1 mg by mouth daily.     HUMALOG KWIKPEN 100 UNIT/ML KwikPen Inject 30 Units into the skin 2 (two) times daily.     hydrochlorothiazide (HYDRODIURIL) 25 MG tablet Take 1 tablet (25 mg total) by mouth daily. Please make overdue appt with Dr. Irish Lack before anymore refills. 2nd attempt 15 tablet 0   HYDROmorphone (DILAUDID) 2 MG tablet Take 1 tablet (2 mg total) by mouth every 4 (four) hours as needed for severe pain. 30 tablet 0   insulin glargine (LANTUS) 100 UNIT/ML injection Inject 20 Units into the skin at bedtime.     metFORMIN (GLUCOPHAGE-XR) 500 MG 24 hr tablet Take 2,000 mg by mouth at bedtime.     solifenacin (VESICARE) 5 MG tablet Take 5 mg by mouth every evening.     sulfamethoxazole-trimethoprim (BACTRIM DS) 800-160 MG tablet Take 1 tablet by mouth 2 (two) times daily. 28 tablet 0   tamsulosin (FLOMAX) 0.4 MG CAPS capsule Take 0.4 mg by mouth at bedtime.  5   traZODone (DESYREL) 50 MG tablet Take 50 mg by mouth at bedtime as needed for sleep.     trolamine salicylate (ASPERCREME) 10 % cream Apply 1 Application topically as needed for muscle pain.     No current facility-administered medications for this visit.    REVIEW OF SYSTEMS:   '[X]'$  denotes positive finding, '[ ]'$  denotes negative finding Cardiac  Comments:  Chest pain or chest pressure:    Shortness of breath upon exertion:    Short of breath when lying flat:    Irregular heart rhythm:        Vascular    Pain in calf, thigh, or hip brought on by ambulation:    Pain in feet at night that wakes you up from your sleep:     Blood clot in your veins:    Leg swelling:         Pulmonary    Oxygen at home:    Productive cough:     Wheezing:         Neurologic    Sudden weakness in arms or legs:     Sudden numbness in arms or legs:     Sudden onset of difficulty speaking or slurred speech:    Temporary loss of vision in one eye:      Problems with dizziness:         Gastrointestinal    Blood in stool:     Vomited blood:         Genitourinary    Burning when urinating:     Blood in urine:  Psychiatric    Major depression:         Hematologic    Bleeding problems:    Problems with blood clotting too easily:        Skin    Rashes or ulcers:        Constitutional    Fever or chills:      PHYSICAL EXAM:   Vitals:   11/24/21 0831  BP: 131/80  Pulse: (!) 102  Resp: 20  Temp: 97.9 F (36.6 C)  SpO2: 98%  Weight: 215 lb (97.5 kg)  Height: 6' (1.829 m)    GENERAL: The patient is a well-nourished male, in no acute distress. The vital signs are documented above. CARDIAC: There is a regular rate and rhythm.  VASCULAR: The old vein harvest incision sites are nearly healed.  He has triphasic posterior tibial Doppler signals PULMONARY: Non-labored respirations MUSCULOSKELETAL: There are no major deformities or cyanosis. NEUROLOGIC: No focal weakness or paresthesias are detected. SKIN: See photo below. PSYCHIATRIC: The patient has a normal affect.   STUDIES:   None  MEDICAL ISSUES:   Right leg wound: He has adequate blood flow to heal this.  He is getting wound care from home health.  I am sending him to the wound center.  He will follow-up with [place] in 3 weeks.  In November he will need to have a duplex of his bypass graft on the right.  He got an additional 30 tablets of Dilaudid 2 mg today    Leia Alf, MD, FACS [facility] [phone] Pager [phone]

## 2021-11-26 ENCOUNTER — Other Ambulatory Visit: Payer: Self-pay | Admitting: Physician Assistant

## 2021-11-26 DIAGNOSIS — I739 Peripheral vascular disease, unspecified: Secondary | ICD-10-CM

## 2021-11-26 MED ORDER — HYDROMORPHONE HCL 4 MG PO TABS: 2.0000 mg | ORAL_TABLET | ORAL | 0 refills | Status: DC | PRN

## 2021-12-03 ENCOUNTER — Encounter (HOSPITAL_BASED_OUTPATIENT_CLINIC_OR_DEPARTMENT_OTHER): Payer: Medicare Other | Admitting: General Surgery

## 2021-12-03 DIAGNOSIS — H40013 Open angle with borderline findings, low risk, bilateral: Secondary | ICD-10-CM | POA: Diagnosis not present

## 2021-12-03 DIAGNOSIS — H43813 Vitreous degeneration, bilateral: Secondary | ICD-10-CM | POA: Diagnosis not present

## 2021-12-03 DIAGNOSIS — Z961 Presence of intraocular lens: Secondary | ICD-10-CM | POA: Diagnosis not present

## 2021-12-03 DIAGNOSIS — E113293 Type 2 diabetes mellitus with mild nonproliferative diabetic retinopathy without macular edema, bilateral: Secondary | ICD-10-CM | POA: Diagnosis not present

## 2021-12-04 ENCOUNTER — Encounter (HOSPITAL_BASED_OUTPATIENT_CLINIC_OR_DEPARTMENT_OTHER): Payer: Medicare Other | Attending: General Surgery | Admitting: General Surgery

## 2021-12-04 DIAGNOSIS — N189 Chronic kidney disease, unspecified: Secondary | ICD-10-CM | POA: Diagnosis not present

## 2021-12-04 DIAGNOSIS — Z6828 Body mass index (BMI) 28.0-28.9, adult: Secondary | ICD-10-CM | POA: Insufficient documentation

## 2021-12-04 DIAGNOSIS — E1151 Type 2 diabetes mellitus with diabetic peripheral angiopathy without gangrene: Secondary | ICD-10-CM | POA: Insufficient documentation

## 2021-12-04 DIAGNOSIS — I70239 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: Secondary | ICD-10-CM | POA: Diagnosis not present

## 2021-12-04 DIAGNOSIS — E1122 Type 2 diabetes mellitus with diabetic chronic kidney disease: Secondary | ICD-10-CM | POA: Insufficient documentation

## 2021-12-04 DIAGNOSIS — E669 Obesity, unspecified: Secondary | ICD-10-CM | POA: Diagnosis not present

## 2021-12-04 DIAGNOSIS — I129 Hypertensive chronic kidney disease with stage 1 through stage 4 chronic kidney disease, or unspecified chronic kidney disease: Secondary | ICD-10-CM | POA: Diagnosis not present

## 2021-12-04 DIAGNOSIS — E11622 Type 2 diabetes mellitus with other skin ulcer: Secondary | ICD-10-CM | POA: Insufficient documentation

## 2021-12-04 DIAGNOSIS — L97812 Non-pressure chronic ulcer of other part of right lower leg with fat layer exposed: Secondary | ICD-10-CM | POA: Insufficient documentation

## 2021-12-09 NOTE — Progress Notes (Signed)
Michael [name], Michael [name] ([id number]) [id number] Page 1 of 11 Visit Report for 12/04/2021 Chief Complaint Document Details Patient Name: Date of Service: HO [place], [name] 12/04/2021 8:00 A M Medical Record Number: [id number] Patient Account Number: [id number] Date of Birth/Sex: Treating RN: 1950-05-10 (71 y.o. M) Primary Care Provider: Merrily Pew, HA NNA H Other Clinician: Referring Provider: Treating Provider/Extender: Minerva Fester in Treatment: 0 Information Obtained from: Patient Chief Complaint Patient presents to the wound care center today with an open arterial ulcer to the right lower extremity in the setting of diabetes mellitus. he has had this problem for 7 months 12/04/2021: ulcer to right lower anterior tibial surface Electronic Signature(s) Signed: 12/04/2021 9:37:12 AM By: [name] Entered By: Fredirick Maudlin on 12/04/2021 09:37:11 -------------------------------------------------------------------------------- Debridement Details Patient Name: Date of Service: HO [name], Michael [name] C. 12/04/2021 8:00 A M Medical Record Number: [id number] Patient Account Number: [id number] Date of Birth/Sex: Treating RN: 06-Feb-1951 (71 y.o. [name] Primary Care Provider: Merrily Pew, HA NNA H Other Clinician: Referring Provider: Treating Provider/Extender: Minerva Fester in Treatment: 0 Debridement Performed for Assessment: Wound #3 Right,Anterior Lower Leg Performed By: Physician Fredirick Maudlin, MD Debridement Type: Debridement Severity of Tissue Pre Debridement: Fat layer exposed Level of Consciousness (Pre-procedure): Awake and Alert Pre-procedure Verification/Time Out Yes - 08:35 Taken: Start Time: 08:42 Pain Control: Lidocaine 5% topical ointment T Area Debrided (L x W): otal 5.2 (cm) x 4.3 (cm) = 22.36 (cm) Tissue and other material debrided: Non-Viable, Eschar, Slough, Subcutaneous,  Slough Level: Skin/Subcutaneous Tissue Debridement Description: Excisional Instrument: Curette Specimen: Tissue Culture Number of Specimens T aken: 1 Bleeding: Minimum Hemostasis Achieved: Pressure Procedural Pain: 0 Post Procedural Pain: 0 Response to Treatment: Procedure was tolerated well Level of Consciousness (Post- Awake and Alert procedure): Post Debridement Measurements of Total Wound Length: (cm) 5.2 Width: (cm) 4.3 Depth: (cm) 0.3 Matuska, Duquan C ([id number]) [id number] Page 2 of 11 Volume: (cm) 5.268 Character of Wound/Ulcer Post Debridement: Improved Severity of Tissue Post Debridement: Fat layer exposed Post Procedure Diagnosis Same as Pre-procedure Notes scribed for Dr Celine Ahr by Sharyn Creamer, RN Electronic Signature(s) Signed: 12/05/2021 7:52:14 AM By: [name] Signed: 12/09/2021 3:12:14 PM By: Sharyn Creamer RN, [name] Previous Signature: 12/04/2021 10:51:15 AM Version By: [name] Entered By: Sharyn Creamer on 12/04/2021 16:45:55 -------------------------------------------------------------------------------- HPI Details Patient Name: Date of Service: [name], Michael C. 12/04/2021 8:00 A M Medical Record Number: [id number] Patient Account Number: [id number] Date of Birth/Sex: Treating RN: 10/21/1950 (71 y.o. M) Primary Care Provider: Merrily Pew, HA NNA H Other Clinician: Referring Provider: Treating Provider/Extender: Minerva Fester in Treatment: 0 History of Present Illness Location: right lateral calf closer to the knee Quality: Patient reports experiencing a dull pain to affected area(s). Severity: Patient states wound(s) are getting worse. Duration: Patient has had the wound for > 7 months prior to seeking treatment at the wound center Timing: Pain in wound is constant (hurts all the time) Context: The wound would happen gradually Modifying Factors: Patient  wound(s)/ulcer(s) are worsening due to : no resolution and a white material at the base of the wound ssociated Signs and Symptoms: Patient reports having:no discharge or purulent material A HPI Description: 70 year old gentleman who has been referred to was from his PCP for a chronic ulceration on his right lower extremity which she's had for several weeks. Past medical history significant for diabetes mellitus type 2, hypertension, hyperlipidemia,  anxiety, obesity, peripheral vascular disease and chronic kidney disease. He has never been a smoker. Most recent lab work done at his PCPs office showed a glucose of 217 milligrams per deciliter which is consistent with hyperglycemia. In January 2017 recent hemoglobin A1c values noted to be 8.2%. In April 2017, he has been seen at the vascular office by Dr. Trula Slade and Dr. Kellie Simmering for right leg claudication. He had a right superficial femoral artery stent in September 2012. Recent noninvasive vascular imaging done on 06/24/2015 showed a right ABI of 1.07 with a triphasic waveform and a right TBI of 0.81. The left was noncompressible with a triphasic waveform and a TBI of 1.17. Right lower extremity arterial duplex was unable to identify stent exit but they were elevated velocities at the stent and in the distal thigh with triphasic waveforms. Dr. Stephens Shire opinion was to return to the clinic in 6 months with ABI and right lower extremity arterial duplex studies to be done. He has seen a dermatologist who had done a biopsy and said it was benign and he was given a steroid cream. 10/23/2015 -- Pathology report of the biopsy done in April 2017 shows ulcer with underlying angiodermatitis consistent with stasis dermatitis. 10/30/2015 -- he is going for shoulder surgery in about 10 days' time and was asking about the perioperative care. His blood sugars are running in the high 100s or low 200s. No hemoglobin A1c done recently. 11/20/2015 -- is back up to 2 weeks  because of recent left shoulder surgery. 12/04/15; patient returns today with the wound for the most part looking healthy. No evidence of infection no debridement required. He is using Prisma for 4 weeks, without obvious improvement per her intake nurse. This started as several small open areas that were raised erythematous. He saw dermatology and at some point this was biopsied that just suggested stasis skin physiology. This certainly doesn't look like that 12/11/15; using Hydrafera Blue. Previous biopsy reviewed, no atypia PAS negative. He has a history of stents in the right leg however he does not appear to have primary arterial insufficiency ABI in this clinic was over 0.9. 12/25/2015 -- he has been approved for grafix and we will order for some to be applied next week 01/01/2016 -- he has had his first application of Grafix today 01/08/2016 -- he has had his second application of Grafix today READMISSION 12/04/2021 This is a now 71 year old man who was followed in our clinic about 5 years ago for an ulceration on his right lateral lower leg, just distal to the knee. He has since undergone a number of revascularization procedures that have been complicated by restenosis and wound infections. During the course of this treatment, he developed a small ulcer on his right anterior tibial surface. Despite use of an Unna boot, the wound has continued to expand. He was referred to the wound care center by Dr. Trula Slade for further evaluation and management. On the right anterior tibial surface, there is an irregular wound with heavy slough and eschar accumulation. The periwound skin is intact but he does have 1-2+ pitting edema. There is no purulent drainage or malodor. Electronic Signature(s) ROGEN, PORTE ([id number]) [id number] Page 3 of 11 Signed: 12/04/2021 9:39:29 AM By: [name] Entered By: Fredirick Maudlin on 12/04/2021  09:39:28 -------------------------------------------------------------------------------- Physical Exam Details Patient Name: Date of Service: HO [place], [place] C. 12/04/2021 8:00 A M Medical Record Number: [id number] Patient Account Number: [id number] Date of Birth/Sex: Treating RN: 08/02/1950 (71  y.o. M) Primary Care Provider: Merrily Pew, HA NNA H Other Clinician: Referring Provider: Treating Provider/Extender: Minerva Fester in Treatment: 0 Constitutional He is hypertensive but asymptomatic.. . . . No acute distress. Respiratory Normal work of breathing on room air.. Cardiovascular Nonpalpable, but good Doppler signals. 1-2+ pitting edema from the foot to the knee on the right.. Notes 12/04/2021: On the right anterior tibial surface, there is an irregular wound with heavy slough and eschar accumulation. The periwound skin is intact but he does have 1-2+ pitting edema. There is no purulent drainage or malodor. Electronic Signature(s) Signed: 12/04/2021 9:40:52 AM By: [name] Entered By: Fredirick Maudlin on 12/04/2021 09:40:52 -------------------------------------------------------------------------------- Physician Orders Details Patient Name: Date of Service: HO [place], [place] C. 12/04/2021 8:00 A M Medical Record Number: [id number] Patient Account Number: [id number] Date of Birth/Sex: Treating RN: 06-20-1950 (71 y.o. [name] Primary Care Provider: Merrily Pew, HA NNA H Other Clinician: Referring Provider: Treating Provider/Extender: Minerva Fester in Treatment: 0 Verbal / Phone Orders: No Diagnosis Coding ICD-10 Coding Code Description [phone] Non-pressure chronic ulcer of other part of right lower leg with fat layer exposed I70.239 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 E11.622 Type 2 diabetes mellitus with other skin ulcer I73.9 Peripheral vascular disease,  unspecified Follow-up Appointments ppointment in 1 week. - Dr. Celine Ahr - room 3 Return A Anesthetic Wound #3 Right,Anterior Lower Leg (In clinic) Topical Lidocaine 5% applied to wound bed Bathing/ Shower/ Hygiene May shower with protection but do not get wound dressing(s) wet. - purchase cast protector from [facility], [facility] or [employer] ([id number]) [id number] Page 4 of 11 Edema Control - Lymphedema / SCD / Other Right Lower Extremity Elevate legs to the level of the heart or above for 30 minutes daily and/or when sitting, a frequency of: Avoid standing for long periods of time. Wound Treatment Wound #3 - Lower Leg Wound Laterality: Right, Anterior Peri-Wound Care: Sween Lotion (Moisturizing lotion) Discharge Instructions: Apply moisturizing lotion as directed Topical: Gentamicin Discharge Instructions: As directed by physician Prim Dressing: Santyl Ointment ary Discharge Instructions: Apply nickel thick amount to wound bed as instructed Secondary Dressing: ABD Pad, 8x10 Discharge Instructions: Apply over primary dressing as directed. Secondary Dressing: Woven Gauze Sponge, Non-Sterile 4x4 in Discharge Instructions: Apply over primary dressing as directed. Secured With: Coban Self-Adherent Wrap 4x5 (in/yd) Discharge Instructions: Secure with Coban as directed. Secured With: [employer] Size 5, 10 (yds) Laboratory naerobe culture (MICRO) - (ICD10 E11.622 - Type 2 diabetes mellitus with other skin ulcer) Bacteria identified in Unspecified specimen by A LOINC Code: 158-3 Convenience Name: Anaerobic culture Patient Medications llergies: Actos, Demerol, glimepiride, nabumetone, oxycodone HCl, penicillin A Notifications Medication Indication Start End prior to debridement 12/04/2021 lidocaine DOSE topical 5 % ointment - ointment topical Electronic Signature(s) Signed: 12/08/2021 5:11:32 PM By: Dellie Catholic RN Signed: 12/09/2021 7:37:08 AM  By: [name] Previous Signature: 12/04/2021 10:51:15 AM Version By: [name] Entered By: Dellie Catholic on 12/08/2021 16:27:51 Prescription 12/04/2021 -------------------------------------------------------------------------------- [place], [place] Fredirick Maudlin MD Patient Name: Provider: October 31, 1950 [id number] Date of Birth: NPI#: Jerilynn Mages [id number] Sex: DEA #: [phone] Phone #: License #: Sun Patient Address: [address] [place], [place] [address] [place], [place] [address] [phone] Allergies Actos; Demerol; glimepiride; nabumetone; oxycodone HCl; penicillin JAMEAR, CARBONNEAU ([id number]) [phone].pdf Page 5 of  11 Provider's Orders naerobe culture - ICD10: E11.622 Bacteria identified in Unspecified specimen by A LOINC Code: 390-3 Convenience Name: Anaerobic culture Hand Signature: Date(s): Electronic Signature(s) Signed: 12/08/2021 5:11:32 PM By: Dellie Catholic RN Signed: 12/09/2021 7:37:08 AM By: [name] Previous Signature: 12/04/2021 9:48:28 AM Version By: [name] Entered By: Dellie Catholic on 12/08/2021 16:27:52 -------------------------------------------------------------------------------- Problem List Details Patient Name: Date of Service: HO [place], [place] C. 12/04/2021 8:00 A M Medical Record Number: [id number] Patient Account Number: [id number] Date of Birth/Sex: Treating RN: April 09, 1950 (71 y.o. M) Primary Care Provider: Merrily Pew, HA NNA H Other Clinician: Referring Provider: Treating Provider/Extender: Minerva Fester in Treatment: 0 Active Problems ICD-10 Encounter Code Description Active Date MDM Diagnosis L97.812 Non-pressure chronic ulcer of other part of right lower leg with fat layer 12/04/2021 No Yes exposed I70.239 Atherosclerosis of native arteries  of right leg with ulceration of unspecified site 12/04/2021 No Yes E11.622 Type 2 diabetes mellitus with other skin ulcer 12/04/2021 No Yes I73.9 Peripheral vascular disease, unspecified 12/04/2021 No Yes Inactive Problems Resolved Problems Electronic Signature(s) Signed: 12/04/2021 9:36:16 AM By: [name] Entered By: Fredirick Maudlin on 12/04/2021 09:36:16 Progress Note Details -------------------------------------------------------------------------------- [name] ([id number]) [id number] Page 6 of 11 Patient Name: Date of Service: HO [facility] [place], [name] 12/04/2021 8:00 A M Medical Record Number: [id number] Patient Account Number: [id number] Date of Birth/Sex: Treating RN: 10-05-1950 (72 y.o. M) Primary Care Provider: Merrily Pew, HA NNA H Other Clinician: Referring Provider: Treating Provider/Extender: Minerva Fester in Treatment: 0 Subjective Chief Complaint Information obtained from Patient Patient presents to the wound care center today with an open arterial ulcer to the right lower extremity in the setting of diabetes mellitus. he has had this problem for 7 months 12/04/2021: ulcer to right lower anterior tibial surface History of Present Illness (HPI) The following HPI elements were documented for the patient's wound: Location: right lateral calf closer to the knee Quality: Patient reports experiencing a dull pain to affected area(s). Severity: Patient states wound(s) are getting worse. Duration: Patient has had the wound for > 7 months prior to seeking treatment at the wound center Timing: Pain in wound is constant (hurts all the time) Context: The wound would happen gradually Modifying Factors: Patient wound(s)/ulcer(s) are worsening due to : no resolution and a white material at the base of the wound Associated Signs and Symptoms: Patient reports having:no discharge or purulent material 71 year old gentleman  who has been referred to was from his PCP for a chronic ulceration on his right lower extremity which she's had for several weeks. Past medical history significant for diabetes mellitus type 2, hypertension, hyperlipidemia, anxiety, obesity, peripheral vascular disease and chronic kidney disease. He has never been a smoker. Most recent lab work done at his PCPs office showed a glucose of 217 milligrams per deciliter which is consistent with hyperglycemia. In January 2017 recent hemoglobin A1c values noted to be 8.2%. In April 2017, he has been seen at the vascular office by Dr. Trula Slade and Dr. Kellie Simmering for right leg claudication. He had a right superficial femoral artery stent in September 2012. Recent noninvasive vascular imaging done on 06/24/2015 showed a right ABI of 1.07 with a triphasic waveform and a right TBI of 0.81. The left was noncompressible with a triphasic waveform and a TBI of 1.17. Right lower extremity arterial duplex was unable to identify stent exit but they were elevated velocities at the stent  and in the distal thigh with triphasic waveforms. Dr. Stephens Shire opinion was to return to the clinic in 6 months with ABI and right lower extremity arterial duplex studies to be done. He has seen a dermatologist who had done a biopsy and said it was benign and he was given a steroid cream. 10/23/2015 -- Pathology report of the biopsy done in April 2017 shows ulcer with underlying angiodermatitis consistent with stasis dermatitis. 10/30/2015 -- he is going for shoulder surgery in about 10 days' time and was asking about the perioperative care. His blood sugars are running in the high 100s or low 200s. No hemoglobin A1c done recently. 11/20/2015 -- is back up to 2 weeks because of recent left shoulder surgery. 12/04/15; patient returns today with the wound for the most part looking healthy. No evidence of infection no debridement required. He is using Prisma for 4 weeks, without obvious  improvement per her intake nurse. This started as several small open areas that were raised erythematous. He saw dermatology and at some point this was biopsied that just suggested stasis skin physiology. This certainly doesn't look like that 12/11/15; using Hydrafera Blue. Previous biopsy reviewed, no atypia PAS negative. He has a history of stents in the right leg however he does not appear to have primary arterial insufficiency ABI in this clinic was over 0.9. 12/25/2015 -- he has been approved for grafix and we will order for some to be applied next week 01/01/2016 -- he has had his first application of Grafix today 01/08/2016 -- he has had his second application of Grafix today READMISSION 12/04/2021 This is a now 71 year old man who was followed in our clinic about 5 years ago for an ulceration on his right lateral lower leg, just distal to the knee. He has since undergone a number of revascularization procedures that have been complicated by restenosis and wound infections. During the course of this treatment, he developed a small ulcer on his right anterior tibial surface. Despite use of an Unna boot, the wound has continued to expand. He was referred to the wound care center by Dr. Trula Slade for further evaluation and management. On the right anterior tibial surface, there is an irregular wound with heavy slough and eschar accumulation. The periwound skin is intact but he does have 1-2+ pitting edema. There is no purulent drainage or malodor. Patient History Information obtained from Patient. Allergies Actos, Demerol, glimepiride, nabumetone, oxycodone HCl, penicillin Family History Cancer - Siblings, Heart Disease - Father, Hypertension - Father,Mother, No family history of Hereditary Spherocytosis, Kidney Disease, Seizures, Stroke, Thyroid Problems, Tuberculosis. Social History Former smoker - smokeless tobacco, Marital Status - Married, Alcohol Use - Rarely - WINE, Drug Use - No  History, Caffeine Use - Daily - COFFEE. Medical History Ear/Nose/Mouth/Throat Patient has history of Chronic sinus problems/congestion - seasonal allergies Cardiovascular Patient has history of Hypertension, Peripheral Arterial Disease - STENTS, Peripheral Venous Disease Endocrine Patient has history of Type II Diabetes - last A1c- 8.2 Hospitalization/Surgery History - left shoulder surgery. Medical A Surgical History Notes nd Constitutional Symptoms (General Health) obesity , h/o (R) leg claudication (right fem stent September 2012) JAVELLE, DONIGAN ([id number]) [phone] Page 7 of 11 Cardiovascular hyperlipidemia Endocrine pt. on diet intentionally losing 20 pounds since December 2016 in an effort to improve A1C levels Integumentary (Skin) psoriatic arthritis Musculoskeletal bilat knee pain identified as an ortho issue psoriatic arthritis Oncologic Prostate cancer 2018 Psychiatric anxiety Review of Systems (ROS) Constitutional Symptoms (General Health) Complains or has symptoms of  Fatigue. Eyes Complains or has symptoms of Glasses / Contacts - readers. Respiratory Denies complaints or symptoms of Chronic or frequent coughs, Shortness of Breath. Gastrointestinal Denies complaints or symptoms of Frequent diarrhea, Nausea, Vomiting. Genitourinary Denies complaints or symptoms of Frequent urination. Integumentary (Skin) Complains or has symptoms of Wounds - Right lower leg. Musculoskeletal Denies complaints or symptoms of Muscle Weakness. Neurologic Complains or has symptoms of Numbness/parasthesias, numbness in hands due to arthritis Objective Constitutional He is hypertensive but asymptomatic.Marland Kitchen No acute distress. Vitals Time Taken: 8:04 AM, Height: 72 in, Source: Stated, Weight: 208 lbs, Source: Stated, BMI: 28.2, Temperature: 98.1 F, Pulse: 89 bpm, Respiratory Rate: 18 breaths/min, Blood Pressure: 150/74 mmHg, Capillary Blood Glucose:  107 mg/dl. Respiratory Normal work of breathing on room air.. Cardiovascular Nonpalpable, but good Doppler signals. 1-2+ pitting edema from the foot to the knee on the right.. General Notes: 12/04/2021: On the right anterior tibial surface, there is an irregular wound with heavy slough and eschar accumulation. The periwound skin is intact but he does have 1-2+ pitting edema. There is no purulent drainage or malodor. Integumentary (Hair, Skin) Wound #3 status is Open. Original cause of wound was Blister. The date acquired was: 09/30/2021. The wound is located on the Right,Anterior Lower Leg. The wound measures 5.2cm length x 4.3cm width x 0.3cm depth; 17.562cm^2 area and 5.268cm^3 volume. There is Fat Layer (Subcutaneous Tissue) exposed. There is no tunneling or undermining noted. There is a medium amount of serosanguineous drainage noted. The wound margin is distinct with the outline attached to the wound base. There is small (1-33%) red granulation within the wound bed. There is a large (67-100%) amount of necrotic tissue within the wound bed including Eschar and Adherent Slough. The periwound skin appearance had no abnormalities noted for texture. The periwound skin appearance had no abnormalities noted for moisture. The periwound skin appearance had no abnormalities noted for color. Periwound temperature was noted as No Abnormality. The periwound has tenderness on palpation. Assessment Active Problems ICD-10 Non-pressure chronic ulcer of other part of right lower leg with fat layer exposed 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 Type 2 diabetes mellitus with other skin ulcer Peripheral vascular disease, unspecified Procedures Wound #3 INDIE, NICKERSON ([id number]) [id number] Page 8 of 11 Pre-procedure diagnosis of Wound #3 is a Diabetic Wound/Ulcer of the Lower Extremity located on the Right,Anterior Lower Leg .Severity  of Tissue Pre Debridement is: Fat layer exposed. There was a Excisional Skin/Subcutaneous Tissue Debridement with a total area of 22.36 sq cm performed by Fredirick Maudlin, MD. With the following instrument(s): Curette to remove Non-Viable tissue/material. Material removed includes Eschar, Subcutaneous Tissue, and Slough after achieving pain control using Lidocaine 5% topical ointment. 1 specimen was taken by a Tissue Culture and sent to the lab per facility protocol. A time out was conducted at 08:35, prior to the start of the procedure. A Minimum amount of bleeding was controlled with Pressure. The procedure was tolerated well with a pain level of 0 throughout and a pain level of 0 following the procedure. Post Debridement Measurements: 5.2cm length x 4.3cm width x 0.3cm depth; 5.268cm^3 volume. Character of Wound/Ulcer Post Debridement is improved. Severity of Tissue Post Debridement is: Fat layer exposed. Post procedure Diagnosis Wound #3: Same as Pre-Procedure Plan Follow-up Appointments: Return Appointment in 1 week. - Dr. Celine Ahr - room 3 Anesthetic: Wound #3 Right,Anterior Lower Leg: (In clinic) Topical Lidocaine 5% applied to wound bed Bathing/ Shower/ Hygiene: May shower with  protection but do not get wound dressing(s) wet. - purchase cast protector from [facility], [facility] or [employer] Edema Control - Lymphedema / SCD / Other: Elevate legs to the level of the heart or above for 30 minutes daily and/or when sitting, a frequency of: Avoid standing for long periods of time. Laboratory ordered were: Anaerobic culture The following medication(s) was prescribed: lidocaine topical 5 % ointment ointment topical for prior to debridement was prescribed at facility WOUND #3: - Lower Leg Wound Laterality: Right, Anterior Peri-Wound Care: Sween Lotion (Moisturizing lotion) Discharge Instructions: Apply moisturizing lotion as directed Topical: Gentamicin Discharge Instructions: As directed by  physician Prim Dressing: Santyl Ointment ary Discharge Instructions: Apply nickel thick amount to wound bed as instructed Secondary Dressing: ABD Pad, 8x10 Discharge Instructions: Apply over primary dressing as directed. Secondary Dressing: Woven Gauze Sponge, Non-Sterile 4x4 in Discharge Instructions: Apply over primary dressing as directed. Secured With: Coban Self-Adherent Wrap 4x5 (in/yd) Discharge Instructions: Secure with Coban as directed. Secured With: Stretch Net Size 5, 10 (yds) 12/04/2021: This is a 71 year old man who has undergone multiple revascularization procedures in his right lower leg. He was referred here to evaluate and manage a wound that has been present on his right lower leg. On the right anterior tibial surface, there is an irregular wound with heavy slough and eschar accumulation. The periwound skin is intact but he does have 1-2+ pitting edema. There is no purulent drainage or malodor. I used a curette to debride slough, eschar, and nonviable subcutaneous tissue from the wound. I then took a culture. The wound will benefit from additional enzymatic debridement so I am going to apply Santyl mixed with topical gentamicin to the wound. The patient indicated that he did not tolerate the Unna boot well so for now, we will just use Kerlix and Coban compression. I will follow-up the culture results and make appropriate antibiotic interventions as indicated. Follow-up in 1 week. Electronic Signature(s) Signed: 12/04/2021 9:42:21 AM By: [name] Entered By: Fredirick Maudlin on 12/04/2021 09:42:21 -------------------------------------------------------------------------------- HxROS Details Patient Name: Date of Service: HO [place], [place] C. 12/04/2021 8:00 A M Medical Record Number: [id number] Patient Account Number: [id number] Date of Birth/Sex: Treating RN: 07/23/1950 (71 y.o. [name] Primary Care Provider: Merrily Pew, HA NNA H Other  Clinician: Referring Provider: Treating Provider/Extender: Minerva Fester in Treatment: 0 Information Obtained From Patient WANDA, CELLUCCI ([id number]) [id number] Page 9 of 11 Constitutional Symptoms (General Health) Complaints and Symptoms: Positive for: Fatigue Medical History: Past Medical History Notes: obesity , h/o (R) leg claudication (right fem stent September 2012) Eyes Complaints and Symptoms: Positive for: Glasses / Contacts - readers Respiratory Complaints and Symptoms: Negative for: Chronic or frequent coughs; Shortness of Breath Gastrointestinal Complaints and Symptoms: Negative for: Frequent diarrhea; Nausea; Vomiting Genitourinary Complaints and Symptoms: Negative for: Frequent urination Integumentary (Skin) Complaints and Symptoms: Positive for: Wounds - Right lower leg Medical History: Past Medical History Notes: psoriatic arthritis Musculoskeletal Complaints and Symptoms: Negative for: Muscle Weakness Medical History: Past Medical History Notes: bilat knee pain identified as an ortho issue psoriatic arthritis Neurologic Complaints and Symptoms: Positive for: Numbness/parasthesias Review of System Notes: numbness in hands due to arthritis Ear/Nose/Mouth/Throat Medical History: Positive for: Chronic sinus problems/congestion - seasonal allergies Hematologic/Lymphatic Cardiovascular Medical History: Positive for: Hypertension; Peripheral Arterial Disease - STENTS; Peripheral Venous Disease Past Medical History Notes: hyperlipidemia Endocrine Medical History: Positive for: Type II Diabetes - last A1c- 8.2 Past Medical History Notes: pt. on diet  intentionally losing 20 pounds since December 2016 in an effort to improve A1C levels Time with diabetes: 9 YRS Treated with: Insulin Blood sugar tested every day: Yes Tested : [address] ([id number])  [id number] Page 10 of 11 Immunological Oncologic Medical History: Past Medical History Notes: Prostate cancer 2018 Psychiatric Medical History: Past Medical History Notes: anxiety HBO Extended History Items Ear/Nose/Mouth/Throat: Chronic sinus problems/congestion Immunizations Pneumococcal Vaccine: Received Pneumococcal Vaccination: Yes Received Pneumococcal Vaccination On or After 60th Birthday: Yes Implantable Devices None Hospitalization / Surgery History Type of Hospitalization/Surgery left shoulder surgery Family and Social History Cancer: Yes - Siblings; Heart Disease: Yes - Father; Hereditary Spherocytosis: No; Hypertension: Yes - Father,Mother; Kidney Disease: No; Seizures: No; Stroke: No; Thyroid Problems: No; Tuberculosis: No; Former smoker - smokeless tobacco; Marital Status - Married; Alcohol Use: Rarely - WINE; Drug Use: No History; Caffeine Use: Daily - COFFEE; Financial Concerns: No; Food, Clothing or Shelter Needs: No; Support System Lacking: No; Transportation Concerns: No Electronic Signature(s) Signed: 12/04/2021 10:51:15 AM By: [name] Signed: 12/09/2021 3:12:14 PM By: Sharyn Creamer RN, [name] Entered By: Sharyn Creamer on 12/04/2021 08:16:10 -------------------------------------------------------------------------------- SuperBill Details Patient Name: Date of Service: HO [place], [place] C. 12/04/2021 Medical Record Number: [id number] Patient Account Number: [id number] Date of Birth/Sex: Treating RN: 06/26/1950 (71 y.o. M) Primary Care Provider: Merrily Pew, HA NNA H Other Clinician: Referring Provider: Treating Provider/Extender: Minerva Fester in Treatment: 0 Diagnosis Coding ICD-10 Codes Code Description [phone] Non-pressure chronic ulcer of other part of right lower leg with fat layer exposed I70.239 Atherosclerosis of native arteries of right leg with ulceration of unspecified  site E11.622 Type 2 diabetes mellitus with other skin ulcer I73.9 Peripheral vascular disease, unspecified Facility Procedures : CPT4 Code: [id number] Description: 03559 - WOUND CARE VISIT-LEV 3 EST PT Modifier: 25 Quantity: 1 : [place], CPT4 Code: [id number] Kelijah C ([id number] I Description: 11042 - DEB SUBQ TISSUE 20 SQ CM/< 14) [phone] CD-10 Diagnosis Description L97.812 Non-pressure chronic ulcer of other part of right lower leg with fat layer expo Modifier: 36741_Physician_5 sed Quantity: 1 1227.pdf Page 11 of 11 : CPT4 Code: [id number] 1 I Description: 4503 - DEB SUBQ TISS EA ADDL 20CM CD-10 Diagnosis Description L97.812 Non-pressure chronic ulcer of other part of right lower leg with fat layer expo Modifier: sed Quantity: 1 Physician Procedures : CPT4 Code Description Modifier [id number] 99214 - WC PHYS LEVEL 4 - EST PT 25 ICD-10 Diagnosis Description L97.812 Non-pressure chronic ulcer of other part of right lower leg with fat layer exposed I70.239 Atherosclerosis of native arteries of right leg  with ulceration of unspecified site E11.622 Type 2 diabetes mellitus with other skin ulcer I73.9 Peripheral vascular disease, unspecified Quantity: 1 : [id number] 11042 - WC PHYS SUBQ TISS 20 SQ CM ICD-10 Diagnosis Description L97.812 Non-pressure chronic ulcer of other part of right lower leg with fat layer exposed Quantity: 1 : [id number] 11045 - WC PHYS SUBQ TISS EA ADDL 20 CM ICD-10 Diagnosis Description Y80.165 Non-pressure chronic ulcer of other part of right lower leg with fat layer exposed Quantity: 1 Electronic Signature(s) Signed: 12/04/2021 12:28:30 PM By: [name] Signed: 12/09/2021 3:12:14 PM By: Sharyn Creamer RN, [name] Previous Signature: 12/04/2021 9:43:09 AM Version By: [name] Entered By: Sharyn Creamer on 12/04/2021 11:58:13

## 2021-12-09 NOTE — Progress Notes (Signed)
DAMICHAEL, HOFMAN ([id number]) [phone] [id number] Page 1 of 4 Visit Report for 12/04/2021 Abuse Risk Screen Details Patient Name: Date of Service: HO [place], [name] 12/04/2021 8:00 A M Medical Record Number: [id number] Patient Account Number: [id number] Date of Birth/Sex: Treating RN: [birth year] (71 y.o. [name] Primary Care Brynja Marker: Merrily Pew, HA NNA H Other Clinician: Referring Jivan Symanski: Treating Wetzel Meester/Extender: Minerva Fester in Treatment: 0 Abuse Risk Screen Items Answer ABUSE RISK SCREEN: Has anyone close to you tried to hurt or harm you recentlyo No Do you feel uncomfortable with anyone in your familyo No Has anyone forced you do things that you didnt want to doo No Electronic Signature(s) Signed: 12/09/2021 3:12:14 PM By: Sharyn Creamer RN, [name] Entered By: Sharyn Creamer on 12/04/2021 08:16:19 -------------------------------------------------------------------------------- Activities of Daily Living Details Patient Name: Date of Service: HO [place], [name] 12/04/2021 8:00 A M Medical Record Number: [id number] Patient Account Number: [id number] Date of Birth/Sex: Treating RN: 1950-07-25 (71 y.o. [name] Primary Care Geovanny Sartin: Merrily Pew, HA NNA H Other Clinician: Referring Relena Ivancic: Treating Nica Friske/Extender: Minerva Fester in Treatment: 0 Activities of Daily Living Items Answer Activities of Daily Living (Please select one for each item) Drive Automobile Completely Able T Medications ake Completely Able Use T elephone Completely Able Care for Appearance Completely Able Use T oilet Completely Able Bath / Shower Completely Able Dress Self Completely Able Feed Self Completely Able Walk Completely Able Get In / Out Bed Completely Able Housework Completely Able Prepare Meals Completely Carnation Completely Able Shop for Self Completely Able Electronic  Signature(s) Signed: 12/09/2021 3:12:14 PM By: Sharyn Creamer RN, [name] Entered By: Sharyn Creamer on 12/04/2021 08:17:07 [name] ([id number]) [id number] Page 2 of 4 -------------------------------------------------------------------------------- Education Screening Details Patient Name: Date of Service: HO [facility] [place], [name] 12/04/2021 8:00 A M Medical Record Number: [id number] Patient Account Number: [id number] Date of Birth/Sex: Treating RN: 03-15-1950 (71 y.o. [name] Primary Care Revan Gendron: Merrily Pew, HA NNA H Other Clinician: Referring Teana Lindahl: Treating Cady Hafen/Extender: Minerva Fester in Treatment: 0 Learning Preferences/Education Level/Primary Language Learning Preference: Explanation, Demonstration, Printed Material Preferred Language: English Cognitive Barrier Language Barrier: No Translator Needed: No Memory Deficit: No Emotional Barrier: No Cultural/Religious Beliefs Affecting Medical Care: No Physical Barrier Impaired Vision: No Impaired Hearing: No Decreased Hand dexterity: No Knowledge/Comprehension Knowledge Level: High Comprehension Level: High Ability to understand written instructions: High Ability to understand verbal instructions: High Motivation Anxiety Level: Calm Cooperation: Cooperative Education Importance: Acknowledges Need Interest in Health Problems: Asks Questions Perception: Coherent Willingness to Engage in Self-Management High Activities: Readiness to Engage in Self-Management High Activities: Electronic Signature(s) Signed: 12/09/2021 3:12:14 PM By: Sharyn Creamer RN, [name] Entered By: Sharyn Creamer on 12/04/2021 08:17:43 -------------------------------------------------------------------------------- Fall Risk Assessment Details Patient Name: Date of Service: HO [place], [place] C. 12/04/2021 8:00 A M Medical Record Number: [id number] Patient Account Number:  [id number] Date of Birth/Sex: Treating RN: Jul 06, 1950 (71 y.o. [name] Primary Care Ionia Schey: Merrily Pew, HA NNA H Other Clinician: Referring Ariellah Faust: Treating Breylan Lefevers/Extender: Minerva Fester in Treatment: 0 Fall Risk Assessment Items Have you had 2 or more falls in the last 12 monthso 0 No Have you had any fall that resulted in injury in the last 12 monthso 0 No FALLS RISK SCREEN Sek, Jahni C ([id number]) [id number] Page 3 of 4 History of falling - immediate or within 3 months 0 No  Secondary diagnosis (Do you have 2 or more medical diagnoseso) 15 Yes Ambulatory aid None/bed rest/wheelchair/nurse 0 Yes Crutches/cane/walker 0 No Furniture 0 No Intravenous therapy Access/Saline/Heparin Lock 0 No Gait/Transferring Normal/ bed rest/ wheelchair 0 Yes Weak (short steps with or without shuffle, stooped but able to lift head while walking, may seek 0 No support from furniture) Impaired (short steps with shuffle, may have difficulty arising from chair, head down, impaired 0 No balance) Mental Status Oriented to own ability 0 No Electronic Signature(s) Signed: 12/09/2021 3:12:14 PM By: Sharyn Creamer RN, [name] Entered By: Sharyn Creamer on 12/04/2021 08:18:07 -------------------------------------------------------------------------------- Foot Assessment Details Patient Name: Date of Service: HO [place], [place] C. 12/04/2021 8:00 A M Medical Record Number: [id number] Patient Account Number: [id number] Date of Birth/Sex: Treating RN: 07-Feb-1951 (71 y.o. [name] Primary Care Calhoun Reichardt: Merrily Pew, HA NNA H Other Clinician: Referring Thurley Francesconi: Treating Tashera Montalvo/Extender: Minerva Fester in Treatment: 0 Foot Assessment Items Site Locations + = Sensation present, - = Sensation absent, C = Callus, U = Ulcer R = Redness, W = Warmth, M = Maceration, PU = Pre-ulcerative lesion F = Fissure, S =  Swelling, D = Dryness Assessment Right: Left: Other Deformity: No No Prior Foot Ulcer: No No Prior Amputation: No No Charcot Joint: No No Ambulatory Status: GaitSHIGERU, LAMPERT ([id number]) [id number] Page 4 of 4 Electronic Signature(s) Signed: 12/09/2021 3:12:14 PM By: Sharyn Creamer RN, [name] Entered By: Sharyn Creamer on 12/04/2021 08:23:37 -------------------------------------------------------------------------------- Nutrition Risk Screening Details Patient Name: Date of Service: HO [place], [name] 12/04/2021 8:00 A M Medical Record Number: [id number] Patient Account Number: [id number] Date of Birth/Sex: Treating RN: October 06, 1950 (71 y.o. [name] Primary Care Areeb Corron: Merrily Pew, HA NNA H Other Clinician: Referring Asim Gersten: Treating Auren Valdes/Extender: Minerva Fester in Treatment: 0 Height (in): 72 Weight (lbs): 208 Body Mass Index (BMI): 28.2 Nutrition Risk Screening Items Score Screening NUTRITION RISK SCREEN: I have an illness or condition that made me change the kind and/or amount of food I eat 0 No I eat fewer than two meals per day 0 No I eat few fruits and vegetables, or milk products 0 No I have three or more drinks of beer, liquor or wine almost every day 0 No I have tooth or mouth problems that make it hard for me to eat 0 No I don't always have enough money to buy the food I need 0 No I eat alone most of the time 0 No I take three or more different prescribed or over-the-counter drugs a day 1 Yes Without wanting to, I have lost or gained 10 pounds in the last six months 0 No I am not always physically able to shop, cook and/or feed myself 0 No Nutrition Protocols Good Risk Protocol Moderate Risk Protocol High Risk Proctocol Risk Level: Good Risk Score: 1 Electronic Signature(s) Signed: 12/09/2021 3:12:14 PM By: Sharyn Creamer RN, [name] Entered By: Sharyn Creamer on 12/04/2021 08:19:37

## 2021-12-09 NOTE — Progress Notes (Signed)
Michael [name], Michael [name] ([id number]) [id number] Page 1 of 9 Visit Report for 12/04/2021 Allergy List Details Patient Name: Date of Service: Michael [name], [name] 12/04/2021 8:00 A M Medical Record Number: [id number] Patient Account Number: [id number] Date of Birth/Sex: Treating RN: 1950/09/24 (71 y.o. [name] Primary Care Yossef Gilkison: Merrily Pew, HA NNA H Other Clinician: Referring Lionardo Haze: Treating Damaris Geers/Extender: Minerva Fester in Treatment: 0 Allergies Active Allergies Actos Demerol glimepiride nabumetone oxycodone HCl penicillin Allergy Notes Electronic Signature(s) Signed: 12/09/2021 3:12:14 PM By: Sharyn Creamer RN, [name] Entered By: Sharyn Creamer on 12/04/2021 08:09:05 -------------------------------------------------------------------------------- Arrival Information Details Patient Name: Date of Service: Michael [name], [name] 12/04/2021 8:00 A M Medical Record Number: [id number] Patient Account Number: [id number] Date of Birth/Sex: Treating RN: [birth year] (71 y.o. [name] Primary Care Sidda Humm: Merrily Pew, HA NNA H Other Clinician: Referring Keian Odriscoll: Treating Lew Prout/Extender: Minerva Fester in Treatment: 0 Visit Information Patient Arrived: Ambulatory Arrival Time: 07:51 Accompanied By: wife Transfer Assistance: None Patient Identification Verified: Yes Patient Has Alerts: Yes Patient Alerts: Patient on Blood Thinner plavix History Since Last Visit Added or deleted any medications: Yes Any new allergies or adverse reactions: No Had a fall or experienced change in activities of daily living that may affect risk of falls: No Signs or symptoms of abuse/neglect since last visito No Hospitalized since last visit: Yes Implantable device outside of the clinic excluding cellular tissue based products placed in the center since last visit: No Has Dressing in Place as Prescribed: Yes Pain  Present Now: Yes Electronic Signature(s) Signed: 12/09/2021 3:12:14 PM By: Sharyn Creamer RN, [name] Hinely, Minier [name] [phone] By: Sharyn Creamer RN, [name] [phone].pdf Page 2 of 9 Signed: 12/09/2021 3:12:14 Entered By: Sharyn Creamer on 12/04/2021 08:06:19 -------------------------------------------------------------------------------- Clinic Level of Care Assessment Details Patient Name: Date of Service: Michael [name], [name] 12/04/2021 8:00 A M Medical Record Number: [id number] Patient Account Number: [id number] Date of Birth/Sex: Treating RN: 12-14-1950 (71 y.o. [name] Primary Care Ralynn San: Merrily Pew, HA NNA H Other Clinician: Referring Marisa Hage: Treating Lataja Newland/Extender: Minerva Fester in Treatment: 0 Clinic Level of Care Assessment Items TOOL 1 Quantity Score X- 1 0 Use when EandM and Procedure is performed on INITIAL visit ASSESSMENTS - Nursing Assessment / Reassessment X- 1 20 General Physical Exam (combine w/ comprehensive assessment (listed just below) when performed on new pt. evals) X- 1 25 Comprehensive Assessment (HX, ROS, Risk Assessments, Wounds Hx, etc.) ASSESSMENTS - Wound and Skin Assessment / Reassessment X- 1 10 Dermatologic / Skin Assessment (not related to wound area) ASSESSMENTS - Ostomy and/or Continence Assessment and Care '[]'$  - 0 Incontinence Assessment and Management '[]'$  - 0 Ostomy Care Assessment and Management (repouching, etc.) PROCESS - Coordination of Care X - Simple Patient / Family Education for ongoing care 1 15 '[]'$  - 0 Complex (extensive) Patient / Family Education for ongoing care X- 1 10 Staff obtains Programmer, systems, Records, T Results / Process Orders est '[]'$  - 0 Staff telephones HHA, Nursing Homes / Clarify orders / etc '[]'$  - 0 Routine Transfer to another Facility (non-emergent condition) '[]'$  - 0 Routine Hospital Admission (non-emergent condition) X- 1 15 New Admissions / Medical laboratory scientific officer / Ordering NPWT Apligraf, etc. , '[]'$  - 0 Emergency Hospital Admission (emergent condition) PROCESS - Special Needs '[]'$  - 0 Pediatric / Minor Patient Management '[]'$  - 0 Isolation Patient Management '[]'$  - 0 Hearing / Language / Visual special needs '[]'$  -  0 Assessment of Community assistance (transportation, [name] planning, etc.) '[]'$  - 0 Additional assistance / Altered mentation '[]'$  - 0 Support Surface(s) Assessment (bed, cushion, seat, etc.) INTERVENTIONS - Miscellaneous '[]'$  - 0 External ear exam '[]'$  - 0 Patient Transfer (multiple staff / Civil Service fast streamer / Similar devices) '[]'$  - 0 Simple Staple / Suture removal (25 or less) '[]'$  - 0 Complex Staple / Suture removal (26 or more) '[]'$  - 0 Hypo/Hyperglycemic Management (do not check if billed separately) '[]'$  - 0 Ankle / Brachial Index (ABI) - do not check if billed separately Has the patient been seen at the hospital within the last three years: Yes Total Score: 95 Level Of Care: New/Established - Level 3 Michael [name], Michael [name] ([id number]) [phone].pdf Page 3 of 9 Electronic Signature(s) Signed: 12/09/2021 3:12:14 PM By: Sharyn Creamer RN, [name] Entered By: Sharyn Creamer on 12/04/2021 09:19:34 -------------------------------------------------------------------------------- Encounter Discharge Information Details Patient Name: Date of Service: Michael [name], [name] 12/04/2021 8:00 A M Medical Record Number: [id number] Patient Account Number: [id number] Date of Birth/Sex: Treating RN: 04-16-1950 (71 y.o. [name] Primary Care Malene Blaydes: Merrily Pew, HA NNA H Other Clinician: Referring Husein Guedes: Treating Lannis Lichtenwalner/Extender: Minerva Fester in Treatment: 0 Encounter Discharge Information Items Post Procedure Vitals Discharge Condition: Stable Temperature (F): 98.1 Ambulatory Status: Ambulatory Pulse (bpm): 89 Discharge Destination: Home Respiratory Rate (breaths/min): 18 Transportation:  Private Auto Blood Pressure (mmHg): 150/74 Accompanied By: wife Schedule Follow-up Appointment: Yes Clinical Summary of Care: Patient Declined Electronic Signature(s) Signed: 12/09/2021 3:12:14 PM By: Sharyn Creamer RN, [name] Entered By: Sharyn Creamer on 12/04/2021 09:20:54 -------------------------------------------------------------------------------- Lower Extremity Assessment Details Patient Name: Date of Service: Michael [name], [name] 12/04/2021 8:00 A M Medical Record Number: [id number] Patient Account Number: [id number] Date of Birth/Sex: Treating RN: 09/27/1950 (71 y.o. [name] Primary Care Arella Blinder: Merrily Pew, HA NNA H Other Clinician: Referring Nyari Olsson: Treating Anushri Casalino/Extender: Minerva Fester in Treatment: 0 Edema Assessment Assessed: [Left: No] [Right: No] Edema: [Left: Ye] [Right: s] Calf Left: Right: Point of Measurement: 35 cm From Medial Instep 39 cm Ankle Left: Right: Point of Measurement: 12 cm From Medial Instep 20.9 cm Knee To Floor Left: Right: From Medial Instep 45 cm Vascular Assessment Coltrain, Jahi [name] ([id number]) [[id number] Page 4 of 9] Pulses: Dorsalis Pedis Palpable: [Right:Yes] Electronic Signature(s) Signed: 12/09/2021 3:12:14 PM By: Sharyn Creamer RN, [name] Entered By: Sharyn Creamer on 12/04/2021 08:26:43 -------------------------------------------------------------------------------- Multi Wound Chart Details Patient Name: Date of Service: Michael [name], [name] 12/04/2021 8:00 A M Medical Record Number: [id number] Patient Account Number: [id number] Date of Birth/Sex: Treating RN: August 20, 1950 (71 y.o. M) Primary Care Josphine Laffey: Merrily Pew, HA NNA H Other Clinician: Referring Caisley Baxendale: Treating Furman Trentman/Extender: Minerva Fester in Treatment: 0 Vital Signs Height(in): 72 Capillary Blood Glucose(mg/dl): 107 Weight(lbs): 208 Pulse(bpm): 4 Body Mass  Index(BMI): 28.2 Blood Pressure(mmHg): 150/74 Temperature(F): 98.1 Respiratory Rate(breaths/min): 18 [3:Photos:] [N/A:N/A] Right, Anterior Lower Leg N/A N/A Wound Location: Blister N/A N/A Wounding Event: Diabetic Wound/Ulcer of the Lower N/A N/A Primary Etiology: Extremity Chronic sinus problems/congestion, N/A N/A Comorbid History: Hypertension, Peripheral Arterial Disease, Peripheral Venous Disease, Type II Diabetes 09/30/2021 N/A N/A Date Acquired: 0 N/A N/A Weeks of Treatment: Open N/A N/A Wound Status: No N/A N/A Wound Recurrence: 5.2x4.3x0.3 N/A N/A Measurements L x W x D (cm) 17.562 N/A N/A A (cm) : rea 5.268 N/A N/A Volume (cm) : Grade 2 N/A N/A Classification: Medium N/A N/A Exudate A mount: Serosanguineous N/A  N/A Exudate Type: red, brown N/A N/A Exudate Color: Distinct, outline attached N/A N/A Wound Margin: Small (1-33%) N/A N/A Granulation A mount: Red N/A N/A Granulation Quality: Large (67-100%) N/A N/A Necrotic A mount: Eschar, Adherent Slough N/A N/A Necrotic Tissue: Fat Layer (Subcutaneous Tissue): Yes N/A N/A Exposed Structures: Fascia: No Tendon: No Muscle: No Joint: No Bone: No None N/A N/A Epithelialization: Debridement - Excisional N/A N/A Debridement: Pre-procedure Verification/Time Out 08:35 N/A N/A Taken: Lidocaine 5% topical ointment N/A N/A Pain ControlQUENTION, MCNEILL ([id number]) [phone].pdf Page 5 of 9 Necrotic/Eschar, Subcutaneous, N/A N/A Tissue Debrided: Slough Skin/Subcutaneous Tissue N/A N/A Level: 22.36 N/A N/A Debridement A (sq cm): rea Curette N/A N/A Instrument: Minimum N/A N/A Bleeding: Pressure N/A N/A Hemostasis Achieved: 0 N/A N/A Procedural Pain: 0 N/A N/A Post Procedural Pain: Debridement Treatment Response: Procedure was tolerated well N/A N/A Post Debridement Measurements L x 5.2x4.3x0.3 N/A N/A W x D (cm) 5.268 N/A N/A Post Debridement Volume:  (cm) No Abnormalities Noted N/A N/A Periwound Skin Texture: No Abnormalities Noted N/A N/A Periwound Skin Moisture: Atrophie Blanche: No N/A N/A Periwound Skin Color: Cyanosis: No Erythema: No No Abnormality N/A N/A Temperature: Yes N/A N/A Tenderness on Palpation: Debridement N/A N/A Procedures Performed: Treatment Notes Wound #3 (Lower Leg) Wound Laterality: Right, Anterior Cleanser Peri-Wound Care Sween Lotion (Moisturizing lotion) Discharge Instruction: Apply moisturizing lotion as directed Topical Gentamicin Discharge Instruction: As directed by physician Primary Dressing Santyl Ointment Discharge Instruction: Apply nickel thick amount to wound bed as instructed Secondary Dressing ABD Pad, 8x10 Discharge Instruction: Apply over primary dressing as directed. Woven Gauze Sponge, Non-Sterile 4x4 in Discharge Instruction: Apply over primary dressing as directed. Secured With [employer] 4x5 (in/yd) Discharge Instruction: Secure with Coban as directed. Stretch Net Size 5, 10 (yds) Compression Wrap Compression Stockings Add-Ons Electronic Signature(s) Signed: 12/04/2021 9:36:34 AM By: [name] Entered By: Fredirick Maudlin on 12/04/2021 09:36:34 -------------------------------------------------------------------------------- Multi-Disciplinary Care Plan Details Patient Name: Date of Service: Michael [name], Michael Park [name]. 12/04/2021 8:00 A M Medical Record Number: [id number] Patient Account Number: [id number] Date of Birth/Sex: Treating RN: [birth year] (71 y.o. [name] Primary Care Amillion Scobee: Merrily Pew, HA NNA H Other Clinician: Referring Mildred Bollard: Treating Jalexus Brett/Extender: Minerva Fester in Treatment: [name], Marjory Lies ([id number]) [id number] Page 6 of 9 Active Inactive Nutrition Nursing Diagnoses: Potential for alteratiion in Nutrition/Potential for imbalanced  nutrition Goals: Patient/caregiver agrees to and verbalizes understanding of need to use nutritional supplements and/or vitamins as prescribed Date Initiated: 12/04/2021 Target Resolution Date: 12/26/2021 Goal Status: Active Patient/caregiver verbalizes understanding of need to maintain therapeutic glucose control per primary care physician Date Initiated: 12/04/2021 Target Resolution Date: 12/26/2021 Goal Status: Active Interventions: Assess patient nutrition upon admission and as needed per policy Provide education on elevated blood sugars and impact on wound healing Provide education on nutrition Notes: Wound/Skin Impairment Nursing Diagnoses: Impaired tissue integrity Knowledge deficit related to ulceration/compromised skin integrity Goals: Patient/caregiver will verbalize understanding of skin care regimen Date Initiated: 12/04/2021 Target Resolution Date: 12/26/2021 Goal Status: Active Ulcer/skin breakdown will have a volume reduction of 30% by week 4 Date Initiated: 12/04/2021 Target Resolution Date: 12/26/2021 Goal Status: Active Interventions: Assess ulceration(s) every visit Provide education on ulcer and skin care Notes: Electronic Signature(s) Signed: 12/09/2021 3:12:14 PM By: Sharyn Creamer RN, [name] Entered By: Sharyn Creamer on 12/04/2021 09:17:48 -------------------------------------------------------------------------------- Pain Assessment Details Patient Name: Date of Service: Michael [name], Michael [name] [name]. 12/04/2021 8:00 A M Medical Record Number: [id number]  Patient Account Number: [id number] Date of Birth/Sex: Treating RN: 18-Jul-1950 (71 y.o. [name] Primary Care Zakia Sainato: Merrily Pew, HA NNA H Other Clinician: Referring Breonna Gafford: Treating Albert Devaul/Extender: Minerva Fester in Treatment: 0 Active Problems Location of Pain Severity and Description of Pain Patient Has Paino Yes Site Locations Rate the pain. Michael [name], Michael [name]  ([id number]) [id number] Page 7 of 9 Rate the pain. Current Pain Level: 9 Pain Management and Medication Current Pain Management: Electronic Signature(s) Signed: 12/09/2021 3:12:14 PM By: Sharyn Creamer RN, [name] Entered By: Sharyn Creamer on 12/04/2021 08:31:06 -------------------------------------------------------------------------------- Patient/Caregiver Education Details Patient Name: Date of Service: Michael FFMA N, Jane 10/5/2023andnbsp8:00 A M Medical Record Number: [id number] Patient Account Number: [id number] Date of Birth/Gender: Treating RN: [birth year] (71 y.o. [name] Primary Care Physician: Merrily Pew, HA NNA H Other Clinician: Referring Physician: Treating Physician/Extender: Minerva Fester in Treatment: 0 Education Assessment Education Provided To: Patient Education Topics Provided Elevated Blood Sugar/ Impact on Healing: Methods: Explain/Verbal Responses: State content correctly Nutrition: Methods: Explain/Verbal Responses: State content correctly Wound/Skin Impairment: Methods: Explain/Verbal Responses: State content correctly Electronic Signature(s) Signed: 12/09/2021 3:12:14 PM By: Sharyn Creamer RN, [name] Entered By: Sharyn Creamer on 12/04/2021 09:18:05 [name] ([id number]) [id number] Page 8 of 9 -------------------------------------------------------------------------------- Wound Assessment Details Patient Name: Date of Service: Michael FFMA N, [name] 12/04/2021 8:00 A M Medical Record Number: [id number] Patient Account Number: [id number] Date of Birth/Sex: Treating RN: 01/27/1951 (71 y.o. [name] Primary Care Jomel Whittlesey: Merrily Pew, HA NNA H Other Clinician: Referring Kearah Gayden: Treating Marlane Hirschmann/Extender: Minerva Fester in Treatment: 0 Wound Status Wound Number: 3 Primary Diabetic Wound/Ulcer of the Lower Extremity Etiology: Wound  Location: Right, Anterior Lower Leg Wound Open Wounding Event: Blister Status: Date Acquired: 09/30/2021 Comorbid Chronic sinus problems/congestion, Hypertension, Peripheral Weeks Of Treatment: 0 History: Arterial Disease, Peripheral Venous Disease, Type II Diabetes Clustered Wound: No Photos Wound Measurements Length: (cm) 5.2 Width: (cm) 4.3 Depth: (cm) 0.3 Area: (cm) 17.562 Volume: (cm) 5.268 % Reduction in Area: % Reduction in Volume: Epithelialization: None Tunneling: No Undermining: No Wound Description Classification: Grade 2 Wound Margin: Distinct, outline attached Exudate Amount: Medium Exudate Type: Serosanguineous Exudate Color: red, brown Foul Odor After Cleansing: No Slough/Fibrino Yes Wound Bed Granulation Amount: Small (1-33%) Exposed Structure Granulation Quality: Red Fascia Exposed: No Necrotic Amount: Large (67-100%) Fat Layer (Subcutaneous Tissue) Exposed: Yes Necrotic Quality: Eschar, Adherent Slough Tendon Exposed: No Muscle Exposed: No Joint Exposed: No Bone Exposed: No Periwound Skin Texture Texture Color No Abnormalities Noted: Yes No Abnormalities Noted: Yes Moisture Temperature / Pain No Abnormalities Noted: Yes Temperature: No Abnormality Tenderness on Palpation: Yes Treatment Notes Wound #3 (Lower Leg) Wound Laterality: Right, Anterior Cleanser BALIN, VANDEGRIFT ([id number]) [phone].pdf Page 9 of 9 Peri-Wound Care Sween Lotion (Moisturizing lotion) Discharge Instruction: Apply moisturizing lotion as directed Topical Gentamicin Discharge Instruction: As directed by physician Primary Dressing Santyl Ointment Discharge Instruction: Apply nickel thick amount to wound bed as instructed Secondary Dressing ABD Pad, 8x10 Discharge Instruction: Apply over primary dressing as directed. Woven Gauze Sponge, Non-Sterile 4x4 in Discharge Instruction: Apply over primary dressing as directed. Secured With [employer] 4x5 (in/yd) Discharge Instruction: Secure with Coban as directed. Stretch Net Size 5, 10 (yds) Compression Wrap Compression Stockings Add-Ons Electronic Signature(s) Signed: 12/09/2021 3:12:14 PM By: Sharyn Creamer RN, [name] Entered By: Sharyn Creamer on 12/04/2021 08:33:47 -------------------------------------------------------------------------------- Vitals Details Patient Name: Date of Service: Michael FFMA N, [place] [name].  12/04/2021 8:00 A M Medical Record Number: [id number] Patient Account Number: [id number] Date of Birth/Sex: Treating RN: 19-Jul-1950 (71 y.o. [name] Primary Care Mistey Hoffert: Merrily Pew, HA NNA H Other Clinician: Referring Isaul Landi: Treating Nicki Furlan/Extender: Minerva Fester in Treatment: 0 Vital Signs Time Taken: 08:04 Temperature (F): 98.1 Height (in): 72 Pulse (bpm): 89 Source: Stated Respiratory Rate (breaths/min): 18 Weight (lbs): 208 Blood Pressure (mmHg): 150/74 Source: Stated Capillary Blood Glucose (mg/dl): 107 Body Mass Index (BMI): 28.2 Reference Range: 80 - 120 mg / dl Electronic Signature(s) Signed: 12/09/2021 3:12:14 PM By: Sharyn Creamer RN, [name] Entered By: Sharyn Creamer on 12/04/2021 08:08:06

## 2021-12-11 ENCOUNTER — Telehealth: Payer: Self-pay | Admitting: *Deleted

## 2021-12-11 DIAGNOSIS — E1165 Type 2 diabetes mellitus with hyperglycemia: Secondary | ICD-10-CM | POA: Diagnosis not present

## 2021-12-11 DIAGNOSIS — S71101A Unspecified open wound, right thigh, initial encounter: Secondary | ICD-10-CM | POA: Diagnosis not present

## 2021-12-11 DIAGNOSIS — Z4801 Encounter for change or removal of surgical wound dressing: Secondary | ICD-10-CM | POA: Diagnosis not present

## 2021-12-11 DIAGNOSIS — E119 Type 2 diabetes mellitus without complications: Secondary | ICD-10-CM | POA: Diagnosis not present

## 2021-12-11 NOTE — Telephone Encounter (Signed)
Patient called 12/10/2021 requesting additional pain medication.  I discussed with Dr Trula Slade and a referral to pain management will be sent.  No change to patients medication at this time.

## 2021-12-12 ENCOUNTER — Encounter (HOSPITAL_BASED_OUTPATIENT_CLINIC_OR_DEPARTMENT_OTHER): Payer: Medicare Other | Admitting: Internal Medicine

## 2021-12-12 ENCOUNTER — Telehealth: Payer: Self-pay

## 2021-12-12 DIAGNOSIS — N189 Chronic kidney disease, unspecified: Secondary | ICD-10-CM | POA: Diagnosis not present

## 2021-12-12 DIAGNOSIS — L97812 Non-pressure chronic ulcer of other part of right lower leg with fat layer exposed: Secondary | ICD-10-CM | POA: Diagnosis not present

## 2021-12-12 DIAGNOSIS — E11622 Type 2 diabetes mellitus with other skin ulcer: Secondary | ICD-10-CM | POA: Diagnosis not present

## 2021-12-12 DIAGNOSIS — I129 Hypertensive chronic kidney disease with stage 1 through stage 4 chronic kidney disease, or unspecified chronic kidney disease: Secondary | ICD-10-CM | POA: Diagnosis not present

## 2021-12-12 DIAGNOSIS — Z6828 Body mass index (BMI) 28.0-28.9, adult: Secondary | ICD-10-CM | POA: Diagnosis not present

## 2021-12-12 DIAGNOSIS — E669 Obesity, unspecified: Secondary | ICD-10-CM | POA: Diagnosis not present

## 2021-12-12 DIAGNOSIS — E1122 Type 2 diabetes mellitus with diabetic chronic kidney disease: Secondary | ICD-10-CM | POA: Diagnosis not present

## 2021-12-12 DIAGNOSIS — I70239 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: Secondary | ICD-10-CM | POA: Diagnosis not present

## 2021-12-12 DIAGNOSIS — E1151 Type 2 diabetes mellitus with diabetic peripheral angiopathy without gangrene: Secondary | ICD-10-CM | POA: Diagnosis not present

## 2021-12-12 NOTE — Telephone Encounter (Signed)
Pt called stating that the wound care center did a debridement and he was having significant pain.  Reviewed pt's chart, returned call for clarification, two identifiers used. Pt attempted to get a pain med prescription from the wound care center, but they refused. Pt stated that he will increase his Tylenol dosage and keep his appt on Monday. Confirmed understanding.

## 2021-12-15 ENCOUNTER — Ambulatory Visit (INDEPENDENT_AMBULATORY_CARE_PROVIDER_SITE_OTHER): Payer: Medicare Other | Admitting: Physician Assistant

## 2021-12-15 VITALS — BP 140/70 | HR 107 | Temp 98.3°F | Resp 16 | Ht 72.0 in | Wt 214.0 lb

## 2021-12-15 DIAGNOSIS — I739 Peripheral vascular disease, unspecified: Secondary | ICD-10-CM | POA: Diagnosis not present

## 2021-12-15 NOTE — Progress Notes (Signed)
Office Note   History of Present Illness   Michael [name] is a 71 y.o. (05/21/1950) male who presents for follow-up wound check on his right leg.  He is s/p right femoral to above-knee popliteal bypass graft with saphenous vein on 04/16/2021 by Dr. Trula Slade.  Following surgery he developed a wound infection at the vein harvest site was taken back to the operating room on 05/02/2021 for debridement.  He went back to the operating room again on 05/05/2021 for further debridement and VAC placement.  He then required balloon angioplasty of the distal anastomosis of his bypass on 10/15/2021.  A wound on his right distal shin approximately worsened about 2 months ago.  He was placed in an [facility] about a month a half ago.  At his last visit with [place] on 11/24/2021, his old vein harvest incisions were nearly healed.  He was also sent to the wound center for aid in management of the right shin wound.  He was to follow-up with [place] in 3 weeks.  The patient went to the wound center on 12/12/2021, and they did a debridement of his right distal shin wound.  At follow-up today he is in pain.  He states on Thursday the wound care center did a debridement and put an Unna boot back on his right leg.  He says that they found some bacteria in his wound, so they have placed him on a 7 day course of levofloxacin.  He is set to follow-up with them once weekly for Unna boot changes and possible further debridements if required.  He has been taking Tylenol for his pain.  He has run out of his previous pain medications that we have prescribed to him.  He believes that his right groin and distal thigh incisions have healed well.  They do not cause him any pain.  He denies any discharge, fever, chills.  Current Outpatient Medications  Medication Sig Dispense Refill   ACCU-CHEK AVIVA PLUS test strip AS DIRECTED SEVEN TIMES A DAY IN VITRO 90 DAYS  4   acetaminophen (TYLENOL) 500 MG tablet Take 1,000 mg by mouth every 6 (six) hours  as needed for moderate pain.     amLODipine (NORVASC) 10 MG tablet Take 10 mg by mouth daily.     aspirin EC 81 MG tablet Take 1 tablet (81 mg total) by mouth daily.     atorvastatin (LIPITOR) 40 MG tablet Take 40 mg by mouth at bedtime.  5   benazepril (LOTENSIN) 40 MG tablet Take 40 mg by mouth daily.     cefadroxil (DURICEF) 500 MG capsule Take 2 capsules (1,000 mg total) by mouth 2 (two) times daily. 56 capsule 0   clonazePAM (KLONOPIN) 1 MG tablet Take 2 mg by mouth at bedtime as needed (sleep).     clopidogrel (PLAVIX) 75 MG tablet TAKE 1 TABLET BY MOUTH EVERY DAY WITH BREAKFAST 90 tablet 3   Continuous Blood Gluc Receiver (FREESTYLE LIBRE 14 DAY READER) DEVI use to monitor blood sugar     dapagliflozin propanediol (FARXIGA) 5 MG TABS tablet Take 5 mg by mouth daily.     diphenhydrAMINE (BENADRYL) 25 MG tablet Take 25 mg by mouth at bedtime as needed for sleep.     fluticasone (FLONASE) 50 MCG/ACT nasal spray Place 2 sprays into both nostrils at bedtime as needed for allergies (before uses CPAP if needed).     folic acid (FOLVITE) 1 MG tablet Take 1 mg by mouth daily.  HUMALOG KWIKPEN 100 UNIT/ML KwikPen Inject 30 Units into the skin 2 (two) times daily.     hydrochlorothiazide (HYDRODIURIL) 25 MG tablet Take 1 tablet (25 mg total) by mouth daily. Please make overdue appt with Dr. Irish Lack before anymore refills. 2nd attempt 15 tablet 0   HYDROmorphone (DILAUDID) 4 MG tablet Take 0.5 tablets (2 mg total) by mouth every 4 (four) hours as needed for severe pain. 15 tablet 0   insulin glargine (LANTUS) 100 UNIT/ML injection Inject 20 Units into the skin at bedtime.     metFORMIN (GLUCOPHAGE-XR) 500 MG 24 hr tablet Take 2,000 mg by mouth at bedtime.     solifenacin (VESICARE) 5 MG tablet Take 5 mg by mouth every evening.     sulfamethoxazole-trimethoprim (BACTRIM DS) 800-160 MG tablet Take 1 tablet by mouth 2 (two) times daily. 28 tablet 0   tamsulosin (FLOMAX) 0.4 MG CAPS capsule Take 0.4  mg by mouth at bedtime.  5   traZODone (DESYREL) 50 MG tablet Take 50 mg by mouth at bedtime as needed for sleep.     trolamine salicylate (ASPERCREME) 10 % cream Apply 1 Application topically as needed for muscle pain.     No current facility-administered medications for this visit.    REVIEW OF SYSTEMS (negative unless checked):   Cardiac:  '[]'$  Chest pain or chest pressure? '[]'$  Shortness of breath upon activity? '[]'$  Shortness of breath when lying flat? '[]'$  Irregular heart rhythm?  Vascular:  '[]'$  Pain in calf, thigh, or hip brought on by walking? '[]'$  Pain in feet at night that wakes you up from your sleep? '[]'$  Blood clot in your veins? '[]'$  Leg swelling?  Pulmonary:  '[]'$  Oxygen at home? '[]'$  Productive cough? '[]'$  Wheezing?  Neurologic:  '[]'$  Sudden weakness in arms or legs? '[]'$  Sudden numbness in arms or legs? '[]'$  Sudden onset of difficult speaking or slurred speech? '[]'$  Temporary loss of vision in one eye? '[]'$  Problems with dizziness?  Gastrointestinal:  '[]'$  Blood in stool? '[]'$  Vomited blood?  Genitourinary:  '[]'$  Burning when urinating? '[]'$  Blood in urine?  Psychiatric:  '[]'$  Major depression  Hematologic:  '[]'$  Bleeding problems? '[]'$  Problems with blood clotting?  Dermatologic:  '[x]'$  Rashes or ulcers?  Constitutional:  '[]'$  Fever or chills?  Ear/Nose/Throat:  '[]'$  Change in hearing? '[]'$  Nose bleeds? '[]'$  Sore throat?  Musculoskeletal:  '[]'$  Back pain? '[]'$  Joint pain? '[]'$  Muscle pain?   Physical Examination   Vitals:   12/15/21 0920  BP: (!) 140/70  Pulse: (!) 107  Resp: 16  Temp: 98.3 F (36.8 C)  TempSrc: Temporal  Weight: 214 lb (97.1 kg)  Height: 6' (1.829 m)   Body mass index is 29.02 kg/m.  General:  WDWN in NAD; vital signs documented above Gait: Not observed HENT: WNL, normocephalic Pulmonary: normal non-labored breathing  Cardiac: regular HR Abdomen: soft, NT, no masses Skin: without rashes Vascular Exam/Pulses: Bilateral feet warm and  well-perfused. Extremities: RLE dressed with Unna boot.  Right groin and medial thigh incisions are healed.  No areas of open wounds or drainage. Musculoskeletal: no muscle wasting or atrophy  Neurologic: A&O X 3;  No focal weakness or paresthesias are detected Psychiatric:  The pt has Normal affect.   Medical Decision Making   Michael [name] is a 71 y.o. male who presents for wound check of the right lower extremity, s/p right femoral to above-knee popliteal artery bypass with vein on 04/16/2021  The patient has been seen by the wound care center last  Thursday, where they did a debridement of his right distal shin wound and placed an Unna boot.  They also initiated a 7-day course of levofloxacin His right groin and distal thigh incisions are healed now.  No signs of infection.  His right foot is warm and well-perfused The patient preferred not to get his Unna boot removed today since it was just placed on Thursday.  He will be following up with the wound care center once weekly for Unna boot changes and possible further debridement.  We can follow this wound healing peripherally He is dealing with significant amount of pain at his right shin wound debridement site.  I have offered the referral to pain management clinic.  He would like to defer his referral at this time and wait to see if the wound care center will perform more debridements on his right leg. He can follow-up with [place] in 4 weeks with RLE BPG duplex study and ABIs   Vicente Serene PA-C [facility] Office: [facility] MD: Trula Slade

## 2021-12-15 NOTE — Progress Notes (Signed)
PAVEL, Michael [name] ([id number]) [id number] Page 1 of 7 Visit Report for 12/12/2021 Debridement Details Patient Name: Date of Service: HO FFMA [place], [name] 12/12/2021 10:15 A M Medical Record Number: [id number] Patient Account Number: [id number] Date of Birth/Sex: Treating RN: 02/03/1951 (71 y.o. M) Primary Care Provider: Merrily Pew, HA NNA H Other Clinician: Referring Provider: Treating Provider/Extender: Leigh Aurora, HA NNA H Weeks in Treatment: 1 Debridement Performed for Assessment: Wound #3 Right,Anterior Lower Leg Performed By: Physician Ricard Dillon., MD Debridement Type: Debridement Severity of Tissue Pre Debridement: Fat layer exposed Level of Consciousness (Pre-procedure): Awake and Alert Pre-procedure Verification/Time Out Yes - 11:14 Taken: Start Time: 11:15 Pain Control: Lidocaine 4% T opical Solution T Area Debrided (L x W): otal 5.5 (cm) x 3.2 (cm) = 17.6 (cm) Tissue and other material debrided: Viable, Subcutaneous Level: Skin/Subcutaneous Tissue Debridement Description: Excisional Instrument: Curette Bleeding: Moderate Hemostasis Achieved: Pressure Response to Treatment: Procedure was tolerated well Level of Consciousness (Post- Awake and Alert procedure): Post Debridement Measurements of Total Wound Length: (cm) 5.5 Width: (cm) 3.2 Depth: (cm) 0.2 Volume: (cm) 2.765 Character of Wound/Ulcer Post Debridement: Requires Further Debridement Severity of Tissue Post Debridement: Fat layer exposed Post Procedure Diagnosis Same as Pre-procedure Notes Scribed for Dr. Dellia Nims by Blanche East, RN Electronic Signature(s) Signed: 12/14/2021 7:19:32 PM By: Linton Ham MD Entered By: Linton Ham on 12/12/2021 11:36:12 -------------------------------------------------------------------------------- HPI Details Patient Name: Date of Service: [name], [name] 12/12/2021 10:15 A M Medical Record Number:  [id number] Patient Account Number: [id number] Date of Birth/Sex: Treating RN: May 27, 1950 (71 y.o. M) Primary Care Provider: Merrily Pew, HA NNA H Other Clinician: Referring Provider: Treating Provider/Extender: Leigh Aurora, HA NNA H Weeks in Treatment: 1 History of Present Illness Michael [name], FOOT ([id number]) [id number] Page 2 of 7 Location: right lateral calf closer to the knee Quality: Patient reports experiencing a dull pain to affected area(s). Severity: Patient states wound(s) are getting worse. Duration: Patient has had the wound for > 7 months prior to seeking treatment at the wound center Timing: Pain in wound is constant (hurts all the time) Context: The wound would happen gradually Modifying Factors: Patient wound(s)/ulcer(s) are worsening due to : no resolution and a white material at the base of the wound ssociated Signs and Symptoms: Patient reports having:no discharge or purulent material A HPI Description: 71 year old gentleman who has been referred to was from his PCP for a chronic ulceration on his right lower extremity which she's had for several weeks. Past medical history significant for diabetes mellitus type 2, hypertension, hyperlipidemia, anxiety, obesity, peripheral vascular disease and chronic kidney disease. He has never been a smoker. Most recent lab work done at his PCPs office showed a glucose of 217 milligrams per deciliter which is consistent with hyperglycemia. In January 2017 recent hemoglobin A1c values noted to be 8.2%. In April 2017, he has been seen at the vascular office by Dr. Trula Slade and Dr. Kellie Simmering for right leg claudication. He had a right superficial femoral artery stent in September 2012. Recent noninvasive vascular imaging done on 06/24/2015 showed a right ABI of 1.07 with a triphasic waveform and a right TBI of 0.81. The left was noncompressible with a triphasic waveform and a TBI of 1.17. Right lower extremity  arterial duplex was unable to identify stent exit but they were elevated velocities at the stent and in the distal thigh with triphasic waveforms. Dr. Stephens Shire opinion was to return to the clinic in 6 months  with ABI and right lower extremity arterial duplex studies to be done. He has seen a dermatologist who had done a biopsy and said it was benign and he was given a steroid cream. 10/23/2015 -- Pathology report of the biopsy done in April 2017 shows ulcer with underlying angiodermatitis consistent with stasis dermatitis. 10/30/2015 -- he is going for shoulder surgery in about 10 days' time and was asking about the perioperative care. His blood sugars are running in the high 100s or low 200s. No hemoglobin A1c done recently. 11/20/2015 -- is back up to 2 weeks because of recent left shoulder surgery. 12/04/15; patient returns today with the wound for the most part looking healthy. No evidence of infection no debridement required. He is using Prisma for 4 weeks, without obvious improvement per her intake nurse. This started as several small open areas that were raised erythematous. He saw dermatology and at some point this was biopsied that just suggested stasis skin physiology. This certainly doesn't look like that 12/11/15; using Hydrafera Blue. Previous biopsy reviewed, no atypia PAS negative. He has a history of stents in the right leg however he does not appear to have primary arterial insufficiency ABI in this clinic was over 0.9. 12/25/2015 -- he has been approved for grafix and we will order for some to be applied next week 01/01/2016 -- he has had his first application of Grafix today 01/08/2016 -- he has had his second application of Grafix today READMISSION 12/04/2021 This is a now 71 year old man who was followed in our clinic about 5 years ago for an ulceration on his right lateral lower leg, just distal to the knee. He has since undergone a number of revascularization procedures that  have been complicated by restenosis and wound infections. During the course of this treatment, he developed a small ulcer on his right anterior tibial surface. Despite use of an Unna boot, the wound has continued to expand. He was referred to the wound care center by Dr. Trula Slade for further evaluation and management. On the right anterior tibial surface, there is an irregular wound with heavy slough and eschar accumulation. The periwound skin is intact but he does have 1-2+ pitting edema. There is no purulent drainage or malodor. 10/13; second visit for this man who has an ischemic wound in the setting of type 2 diabetes on the right anterior mid tibia area. He has been revascularized. Using [employer]) Signed: 12/14/2021 7:19:32 PM By: Linton Ham MD Entered By: Linton Ham on 12/12/2021 11:37:01 -------------------------------------------------------------------------------- Physical Exam Details Patient Name: Date of Service: [name], [name] 12/12/2021 10:15 A M Medical Record Number: [id number] Patient Account Number: [id number] Date of Birth/Sex: Treating RN: 09/27/1950 (71 y.o. M) Primary Care Provider: Merrily Pew, HA NNA H Other Clinician: Referring Provider: Treating Provider/Extender: Cain Sieve MEY, HA NNA H Weeks in Treatment: 1 Cardiovascular Pedal pulses are nicely palpable at the dorsalis pedis and posterior tibia. Notes Wound exam; right anterior mid tibia. Irregular wound completely nonviable surface. No evidence of surrounding infection. I used a #5 curette aggressive debridement of nonviable subcutaneous tissue and fortunately I am still not able to get to convincing healthy granulation Electronic Signature(s) Signed: 12/14/2021 7:19:32 PM By: Linton Ham MD Entered By: Linton Ham on 12/12/2021 11:38:56 Lofgren, Marjory Lies ([id number]) [id number] Page 3 of  7 -------------------------------------------------------------------------------- Physician Orders Details Patient Name: Date of Service: [name], [name] 12/12/2021 10:15 A M Medical Record Number: [id number] Patient Account Number:  [id number] Date of Birth/Sex: Treating RN: 1950-11-22 (71 y.o. [name] Primary Care Provider: Merrily Pew, HA NNA H Other Clinician: Referring Provider: Treating Provider/Extender: Leigh Aurora, HA NNA H Weeks in Treatment: 1 Verbal / Phone Orders: No Diagnosis Coding ICD-10 Coding Code Description [phone] Non-pressure chronic ulcer of other part of right lower leg with fat layer exposed I70.239 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 E11.622 Type 2 diabetes mellitus with other skin ulcer I73.9 Peripheral vascular disease, unspecified Follow-up Appointments ppointment in 1 week. - Dr. Celine Ahr - room 3 Return A Anesthetic Wound #3 Right,Anterior Lower Leg (In clinic) Topical Lidocaine 5% applied to wound bed Bathing/ Shower/ Hygiene May shower with protection but do not get wound dressing(s) wet. - purchase cast protector from [facility], [facility] or [employer] Edema Control - Lymphedema / SCD / Other Right Lower Extremity Elevate legs to the level of the heart or above for 30 minutes daily and/or when sitting, a frequency of: Avoid standing for long periods of time. Wound Treatment Wound #3 - Lower Leg Wound Laterality: Right, Anterior Peri-Wound Care: Sween Lotion (Moisturizing lotion) Discharge Instructions: Apply moisturizing lotion as directed Topical: Gentamicin Discharge Instructions: As directed by physician Prim Dressing: Santyl Ointment ary Discharge Instructions: Apply nickel thick amount to wound bed as instructed Secondary Dressing: ABD Pad, 8x10 Discharge Instructions: Apply over primary dressing as directed. Secondary Dressing: Woven Gauze Sponge, Non-Sterile 4x4 in Discharge  Instructions: Apply over primary dressing as directed. Secured With: Coban Self-Adherent Wrap 4x5 (in/yd) Discharge Instructions: Secure with Coban as directed. Secured With: [employer] Size 5, 10 (yds) Electronic Signature(s) Signed: 12/12/2021 5:15:19 PM By: [name] Signed: 12/14/2021 7:19:32 PM By: Linton Ham MD Entered By: Blanche East on 12/12/2021 11:28:32 OBINNA, EHRESMAN ([id number]) [id number] Page 4 of 7 -------------------------------------------------------------------------------- Problem List Details Patient Name: Date of Service: [name], [name] 12/12/2021 10:15 A M Medical Record Number: [id number] Patient Account Number: [id number] Date of Birth/Sex: Treating RN: 05-Nov-1950 (71 y.o. Ernestene Mention Primary Care Provider: Merrily Pew, HA NNA H Other Clinician: Referring Provider: Treating Provider/Extender: Leigh Aurora, HA NNA H Weeks in Treatment: 1 Active Problems ICD-10 Encounter Code Description Active Date MDM Diagnosis L97.812 Non-pressure chronic ulcer of other part of right lower leg with fat layer 12/04/2021 No Yes exposed I70.239 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 12/04/2021 No Yes E11.622 Type 2 diabetes mellitus with other skin ulcer 12/04/2021 No Yes I73.9 Peripheral vascular disease, unspecified 12/04/2021 No Yes Inactive Problems Resolved Problems Electronic Signature(s) Signed: 12/14/2021 7:19:32 PM By: Linton Ham MD Entered By: Linton Ham on 12/12/2021 11:35:52 -------------------------------------------------------------------------------- Progress Note Details Patient Name: Date of Service: [name], [name] 12/12/2021 10:15 A M Medical Record Number: [id number] Patient Account Number: [id number] Date of Birth/Sex: Treating RN: [birth year] (71 y.o. M) Primary Care Provider: Merrily Pew, HA NNA H Other Clinician: Referring  Provider: Treating Provider/Extender: Cain Sieve MEY, HA NNA H Weeks in Treatment: 1 Subjective History of Present Illness (HPI) The following HPI elements were documented for the patient's wound: Location: right lateral calf closer to the knee Quality: Patient reports experiencing a dull pain to affected area(s). Severity: Patient states wound(s) are getting worse. Duration: Patient has had the wound for > 7 months prior to seeking treatment at the wound center Timing: Pain in wound is constant (hurts all the time) Context: The wound would happen gradually Modifying  Factors: Patient wound(s)/ulcer(s) are worsening due to : no resolution and a white material at the base of the wound Associated Signs and Symptoms: Patient reports having:no discharge or purulent material KARMAN, VENEY ([id number]) [id number] Page 28 of 63 71 year old gentleman who has been referred to was from his PCP for a chronic ulceration on his right lower extremity which she's had for several weeks. Past medical history significant for diabetes mellitus type 2, hypertension, hyperlipidemia, anxiety, obesity, peripheral vascular disease and chronic kidney disease. He has never been a smoker. Most recent lab work done at his PCPs office showed a glucose of 217 milligrams per deciliter which is consistent with hyperglycemia. In January 2017 recent hemoglobin A1c values noted to be 8.2%. In April 2017, he has been seen at the vascular office by Dr. Trula Slade and Dr. Kellie Simmering for right leg claudication. He had a right superficial femoral artery stent in September 2012. Recent noninvasive vascular imaging done on 06/24/2015 showed a right ABI of 1.07 with a triphasic waveform and a right TBI of 0.81. The left was noncompressible with a triphasic waveform and a TBI of 1.17. Right lower extremity arterial duplex was unable to identify stent exit but they were elevated velocities at the stent and  in the distal thigh with triphasic waveforms. Dr. Stephens Shire opinion was to return to the clinic in 6 months with ABI and right lower extremity arterial duplex studies to be done. He has seen a dermatologist who had done a biopsy and said it was benign and he was given a steroid cream. 10/23/2015 -- Pathology report of the biopsy done in April 2017 shows ulcer with underlying angiodermatitis consistent with stasis dermatitis. 10/30/2015 -- he is going for shoulder surgery in about 10 days' time and was asking about the perioperative care. His blood sugars are running in the high 100s or low 200s. No hemoglobin A1c done recently. 11/20/2015 -- is back up to 2 weeks because of recent left shoulder surgery. 12/04/15; patient returns today with the wound for the most part looking healthy. No evidence of infection no debridement required. He is using Prisma for 4 weeks, without obvious improvement per her intake nurse. This started as several small open areas that were raised erythematous. He saw dermatology and at some point this was biopsied that just suggested stasis skin physiology. This certainly doesn't look like that 12/11/15; using Hydrafera Blue. Previous biopsy reviewed, no atypia PAS negative. He has a history of stents in the right leg however he does not appear to have primary arterial insufficiency ABI in this clinic was over 0.9. 12/25/2015 -- he has been approved for grafix and we will order for some to be applied next week 01/01/2016 -- he has had his first application of Grafix today 01/08/2016 -- he has had his second application of Grafix today READMISSION 12/04/2021 This is a now 71 year old man who was followed in our clinic about 5 years ago for an ulceration on his right lateral lower leg, just distal to the knee. He has since undergone a number of revascularization procedures that have been complicated by restenosis and wound infections. During the course of this treatment, he  developed a small ulcer on his right anterior tibial surface. Despite use of an Unna boot, the wound has continued to expand. He was referred to the wound care center by Dr. Trula Slade for further evaluation and management. On the right anterior tibial surface, there is an irregular wound with heavy slough and eschar accumulation. The periwound skin  is intact but he does have 1-2+ pitting edema. There is no purulent drainage or malodor. 10/13; second visit for this man who has an ischemic wound in the setting of type 2 diabetes on the right anterior mid tibia area. He has been revascularized. Using Santyl gent Objective Constitutional Vitals Time Taken: 10:18 AM, Height: 72 in, Weight: 208 lbs, BMI: 28.2, Temperature: 98.6 F, Pulse: 93 bpm, Respiratory Rate: 18 breaths/min, Blood Pressure: 135/71 mmHg, Capillary Blood Glucose: 135 mg/dl. General Notes: glucose per tp report this am Cardiovascular Pedal pulses are nicely palpable at the dorsalis pedis and posterior tibia. General Notes: Wound exam; right anterior mid tibia. Irregular wound completely nonviable surface. No evidence of surrounding infection. I used a #5 curette aggressive debridement of nonviable subcutaneous tissue and fortunately I am still not able to get to convincing healthy granulation Integumentary (Hair, Skin) Wound #3 status is Open. Original cause of wound was Blister. The date acquired was: 09/30/2021. The wound has been in treatment 1 weeks. The wound is located on the Right,Anterior Lower Leg. The wound measures 5.5cm length x 3.5cm width x 0.2cm depth; 15.119cm^2 area and 3.024cm^3 volume. There is Fat Layer (Subcutaneous Tissue) exposed. There is no tunneling or undermining noted. There is a medium amount of serosanguineous drainage noted. The wound margin is distinct with the outline attached to the wound base. There is small (1-33%) red granulation within the wound bed. There is a large (67-100%) amount of necrotic  tissue within the wound bed including Adherent Slough. The periwound skin appearance had no abnormalities noted for texture. The periwound skin appearance had no abnormalities noted for moisture. The periwound skin appearance had no abnormalities noted for color. Periwound temperature was noted as No Abnormality. The periwound has tenderness on palpation. Assessment Active Problems ICD-10 Non-pressure chronic ulcer of other part of right lower leg with fat layer exposed 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 Type 2 diabetes mellitus with other skin ulcer Peripheral vascular disease, unspecified DANNIE, [place] ([id number]) [id number] Page 6 of 7 Procedures Wound #3 Pre-procedure diagnosis of Wound #3 is a Diabetic Wound/Ulcer of the Lower Extremity located on the Right,Anterior Lower Leg .Severity of Tissue Pre Debridement is: Fat layer exposed. There was a Excisional Skin/Subcutaneous Tissue Debridement with a total area of 17.6 sq cm performed by Ricard Dillon., MD. With the following instrument(s): Curette to remove Viable tissue/material. Material removed includes Subcutaneous Tissue after achieving pain control using Lidocaine 4% Topical Solution. No specimens were taken. A time out was conducted at 11:14, prior to the start of the procedure. A Moderate amount of bleeding was controlled with Pressure. The procedure was tolerated well. Post Debridement Measurements: 5.5cm length x 3.2cm width x 0.2cm depth; 2.765cm^3 volume. Character of Wound/Ulcer Post Debridement requires further debridement. Severity of Tissue Post Debridement is: Fat layer exposed. Post procedure Diagnosis Wound #3: Same as Pre-Procedure General Notes: Scribed for Dr. Dellia Nims by Blanche East, RN. Plan Follow-up Appointments: Return Appointment in 1 week. - Dr. Celine Ahr - room 3 Anesthetic: Wound #3 Right,Anterior Lower Leg: (In clinic)  Topical Lidocaine 5% applied to wound bed Bathing/ Shower/ Hygiene: May shower with protection but do not get wound dressing(s) wet. - purchase cast protector from [facility], [facility] or [employer] Edema Control - Lymphedema / SCD / Other: Elevate legs to the level of the heart or above for 30 minutes daily and/or when sitting, a frequency of: Avoid standing for long periods of time. WOUND #3: -  Lower Leg Wound Laterality: Right, Anterior Peri-Wound Care: Sween Lotion (Moisturizing lotion) Discharge Instructions: Apply moisturizing lotion as directed Topical: Gentamicin Discharge Instructions: As directed by physician Prim Dressing: Santyl Ointment ary Discharge Instructions: Apply nickel thick amount to wound bed as instructed Secondary Dressing: ABD Pad, 8x10 Discharge Instructions: Apply over primary dressing as directed. Secondary Dressing: Woven Gauze Sponge, Non-Sterile 4x4 in Discharge Instructions: Apply over primary dressing as directed. Secured With: Coban Self-Adherent Wrap 4x5 (in/yd) Discharge Instructions: Secure with Coban as directed. Secured With: Stretch Net Size 5, 10 (yds) 1. I did not change the primary dressing which is Santyl gentamicin. 2. Unfortunately the patient's Levaquin which was called in on Monday went to the wrong pharmacy I will correct that today 3. The patient has been revascularized his peripheral pulses on the right are easily palpable Electronic Signature(s) Signed: 12/14/2021 7:19:32 PM By: Linton Ham MD Entered By: Linton Ham on 12/12/2021 11:39:51 -------------------------------------------------------------------------------- SuperBill Details Patient Name: Date of Service: [name], [place]. 12/12/2021 Medical Record Number: [id number] Patient Account Number: [id number] Date of Birth/Sex: Treating RN: 1951-01-28 (71 y.o. M) Primary Care Provider: Merrily Pew, HA NNA H Other Clinician: Referring Provider: Treating Provider/Extender: Leigh Aurora, HA NNA H Weeks in Treatment: 1 Diagnosis Coding ICD-10 Codes Code Description [phone] Non-pressure chronic ulcer of other part of right lower leg with fat layer exposed I70.239 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 JADARRIUS, MASELLI ([id number]) [id number] Page 7 of 7 E11.622 Type 2 diabetes mellitus with other skin ulcer I73.9 Peripheral vascular disease, unspecified Facility Procedures : CPT4 Code: [id number] Description: 93818 - DEB SUBQ TISSUE 20 SQ CM/< ICD-10 Diagnosis Description I70.239 Atherosclerosis of native arteries of right leg with ulceration of unspecified s Modifier: ite Quantity: 1 Physician Procedures : CPT4 Code Description Modifier [id number] - WC PHYS SUBQ TISS 20 SQ CM ICD-10 Diagnosis Description I70.239 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 Quantity: 1 Electronic Signature(s) Signed: 12/14/2021 7:19:32 PM By: Linton Ham MD Entered By: Linton Ham on 12/12/2021 12:19:02

## 2021-12-15 NOTE — Progress Notes (Signed)
Michael [name], Michael [name] ([id number]) [id number] Page 1 of 7 Visit Report for 12/12/2021 Arrival Information Details Patient Name: Date of Service: Michael FFMA [place], [name] 12/12/2021 10:15 A M Medical Record Number: [id number] Patient Account Number: [id number] Date of Birth/Sex: Treating RN: May 01, 1950 (71 y.o. Ernestene Mention Primary Care Maureen Delatte: STA MEY, HA NNA H Other Clinician: Referring Brailyn Killion: Treating Japhet Morgenthaler/Extender: Cain Sieve MEY, HA NNA H Weeks in Treatment: 1 Visit Information History Since Last Visit Added or deleted any medications: No Patient Arrived: Ambulatory Any new allergies or adverse reactions: No Arrival Time: 10:16 Had a fall or experienced change in No Accompanied By: spouse activities of daily living that may affect Transfer Assistance: None risk of falls: Patient Identification Verified: Yes Signs or symptoms of abuse/neglect since last visito No Secondary Verification Process Completed: Yes Hospitalized since last visit: No Patient Requires Transmission-Based Precautions: No Implantable device outside of the clinic excluding No Patient Has Alerts: Yes cellular tissue based products placed in the center Patient Alerts: Patient on Blood Thinner since last visit: plavix Has Dressing in Place as Prescribed: Yes Has Compression in Place as Prescribed: Yes Pain Present Now: Yes Electronic Signature(s) Signed: 12/15/2021 6:17:50 AM By: [name], [name] Entered By: Baruch Gouty on 12/12/2021 10:18:24 -------------------------------------------------------------------------------- Encounter Discharge Information Details Patient Name: Date of Service: Michael [name], Michael LTER [name]. 12/12/2021 10:15 A M Medical Record Number: [id number] Patient Account Number: [id number] Date of Birth/Sex: Treating RN: 1950/05/05 (70 y.o. [name] Primary Care Brysin Towery: Merrily Pew, HA NNA H Other Clinician: Referring  Derold Dorsch: Treating Whittley Carandang/Extender: Cain Sieve MEY, HA NNA H Weeks in Treatment: 1 Encounter Discharge Information Items Post Procedure Vitals Discharge Condition: Stable Temperature (F): 98.6 Ambulatory Status: Ambulatory Pulse (bpm): 93 Discharge Destination: Home Respiratory Rate (breaths/min): 18 Transportation: Private Auto Blood Pressure (mmHg): 135/71 Accompanied By: self Schedule Follow-up Appointment: Yes Clinical Summary of Care: Electronic Signature(s) Signed: 12/12/2021 5:15:19 PM By: [name] Entered By: Blanche East on 12/12/2021 11:38:58 [name] ([id number]) [id number] Page 2 of 7 -------------------------------------------------------------------------------- Lower Extremity Assessment Details Patient Name: Date of Service: Michael Michael [name], [name] 12/12/2021 10:15 A M Medical Record Number: [id number] Patient Account Number: [id number] Date of Birth/Sex: Treating RN: [birth year] (71 y.o. Ernestene Mention Primary Care Kayton Ripp: Merrily Pew, HA NNA H Other Clinician: Referring Jia Mohamed: Treating Jayquan Bradsher/Extender: Cain Sieve MEY, HA NNA H Weeks in Treatment: 1 Edema Assessment Assessed: [Left: No] [Right: No] Edema: [Left: Ye] [Right: s] Calf Left: Right: Point of Measurement: 35 cm From Medial Instep 35 cm Ankle Left: Right: Point of Measurement: 12 cm From Medial Instep 21 cm Vascular Assessment Pulses: Dorsalis Pedis Palpable: [Right:Yes] Electronic Signature(s) Signed: 12/15/2021 6:17:50 AM By: [name], [name] Entered By: Baruch Gouty on 12/12/2021 10:24:51 -------------------------------------------------------------------------------- Multi Wound Chart Details Patient Name: Date of Service: Michael [name], Michael LTER [name]. 12/12/2021 10:15 A M Medical Record Number: [id number] Patient Account Number: [id number] Date of Birth/Sex: Treating RN: 06/25/1950 (71 y.o. M) Primary Care Deuntae Kocsis:  STA MEY, HA NNA H Other Clinician: Referring Jaylei Fuerte: Treating Pritesh Sobecki/Extender: Cain Sieve MEY, HA NNA H Weeks in Treatment: 1 Vital Signs Height(in): 72 Capillary Blood Glucose(mg/dl): 135 Weight(lbs): 208 Pulse(bpm): 26 Body Mass Index(BMI): 28.2 Blood Pressure(mmHg): 135/71 Temperature(F): 98.6 Respiratory Rate(breaths/min): 18 [3:Photos:] [[name]/A:[name]/A] Right, Anterior Lower Leg [name]/A [name]/A Wound Location: Blister [name]/A [name]/A Wounding Event: Diabetic Wound/Ulcer of the Lower [name]/A [name]/A Primary Etiology: Extremity Chronic sinus problems/congestion, [name]/A [name]/A Comorbid History: Hypertension,  Peripheral Arterial Disease, Peripheral Venous Disease, Type II Diabetes 09/30/2021 [name]/A [name]/A Date Acquired: 1 [name]/A [name]/A Weeks of Treatment: Open [name]/A [name]/A Wound Status: No [name]/A [name]/A Wound Recurrence: 5.5x3.5x0.2 [name]/A [name]/A Measurements L x W x D (cm) 15.119 [name]/A [name]/A A (cm) : rea 3.024 [name]/A [name]/A Volume (cm) : 13.90% [name]/A [name]/A % Reduction in A rea: 42.60% [name]/A [name]/A % Reduction in Volume: Grade 2 [name]/A [name]/A Classification: Medium [name]/A [name]/A Exudate A mount: Serosanguineous [name]/A [name]/A Exudate Type: red, brown [name]/A [name]/A Exudate Color: Distinct, outline attached [name]/A [name]/A Wound Margin: Small (1-33%) [name]/A [name]/A Granulation A mount: Red [name]/A [name]/A Granulation Quality: Large (67-100%) [name]/A [name]/A Necrotic A mount: Fat Layer (Subcutaneous Tissue): Yes [name]/A [name]/A Exposed Structures: Fascia: No Tendon: No Muscle: No Joint: No Bone: No Small (1-33%) [name]/A [name]/A Epithelialization: Debridement - Excisional [name]/A [name]/A Debridement: Pre-procedure Verification/Time Out 11:14 [name]/A [name]/A Taken: Lidocaine 4% Topical Solution [name]/A [name]/A Pain Control: Subcutaneous [name]/A [name]/A Tissue Debrided: Skin/Subcutaneous Tissue [name]/A [name]/A Level: 17.6 [name]/A [name]/A Debridement A (sq cm): rea Curette [name]/A [name]/A Instrument: Moderate [name]/A [name]/A Bleeding: Pressure [name]/A [name]/A Hemostasis A chieved: Procedure was tolerated well [name]/A [name]/A Debridement Treatment  Response: 5.5x3.2x0.2 [name]/A [name]/A Post Debridement Measurements L x W x D (cm) 2.765 [name]/A [name]/A Post Debridement Volume: (cm) No Abnormalities Noted [name]/A [name]/A Periwound Skin Texture: No Abnormalities Noted [name]/A [name]/A Periwound Skin Moisture: Atrophie Blanche: No [name]/A [name]/A Periwound Skin Color: Cyanosis: No Erythema: No No Abnormality [name]/A [name]/A Temperature: Yes [name]/A [name]/A Tenderness on Palpation: Debridement [name]/A [name]/A Procedures Performed: Treatment Notes Electronic Signature(s) Signed: 12/14/2021 7:19:32 PM By: Linton Ham MD Entered By: Linton Ham on 12/12/2021 11:35:59 -------------------------------------------------------------------------------- Multi-Disciplinary Care Plan Details Patient Name: Date of Service: Michael [name], Michael LTER [name]. 12/12/2021 10:15 A M Medical Record Number: [id number] Patient Account Number: [id number] Date of Birth/Sex: Treating RN: December 23, 1950 (71 y.o. Ernestene Mention Primary Care Erastus Bartolomei: Merrily Pew, HA NNA H Other Clinician: Referring Dacari Beckstrand: Treating Mikias Lanz/Extender: Leigh Aurora, HA NNA H Weeks in Treatment: 1 Active Inactive Michael [name], Michael [name] ([id number]) [id number] Page 4 of 7 Nutrition Nursing Diagnoses: Potential for alteratiion in Nutrition/Potential for imbalanced nutrition Goals: Patient/caregiver agrees to and verbalizes understanding of need to use nutritional supplements and/or vitamins as prescribed Date Initiated: 12/04/2021 Target Resolution Date: 12/26/2021 Goal Status: Active Patient/caregiver verbalizes understanding of need to maintain therapeutic glucose control per primary care physician Date Initiated: 12/04/2021 Target Resolution Date: 12/26/2021 Goal Status: Active Interventions: Assess patient nutrition upon admission and as needed per policy Provide education on elevated blood sugars and impact on wound healing Provide education on nutrition Treatment Activities: Education provided  on Nutrition : 12/04/2021 Notes: Wound/Skin Impairment Nursing Diagnoses: Impaired tissue integrity Knowledge deficit related to ulceration/compromised skin integrity Goals: Patient/caregiver will verbalize understanding of skin care regimen Date Initiated: 12/04/2021 Target Resolution Date: 12/26/2021 Goal Status: Active Ulcer/skin breakdown will have a volume reduction of 30% by week 4 Date Initiated: 12/04/2021 Target Resolution Date: 12/26/2021 Goal Status: Active Interventions: Assess ulceration(s) every visit Provide education on ulcer and skin care Notes: Electronic Signature(s) Signed: 12/15/2021 6:17:50 AM By: [name], [name] Entered By: Baruch Gouty on 12/12/2021 10:39:35 -------------------------------------------------------------------------------- Pain Assessment Details Patient Name: Date of Service: Michael [name], Michael LTER [name]. 12/12/2021 10:15 A M Medical Record Number: [id number] Patient Account Number: [id number] Date of Birth/Sex: Treating RN: 1950-10-29 (71 y.o. Ernestene Mention Primary Care Patryk Conant: Merrily Pew, HA NNA H Other Clinician: Referring Derian Pfost: Treating Haddie Bruhl/Extender: Leigh Aurora,  HA NNA H Weeks in Treatment: 1 Active Problems Location of Pain Severity and Description of Pain Patient Has Paino Yes Site Locations Pain LocationWINSON, Michael [name] ([id number]) [id number] Page 5 of 7 Pain Location: Pain in Ulcers With Dressing Change: Yes Rate the pain. Current Pain Level: 5 Least Pain Level: 1 Character of Pain Describe the Pain: Other: prickly Pain Management and Medication Current Pain Management: Medication: Yes Is the Current Pain Management Adequate: Adequate How does your wound impact your activities of daily livingo Sleep: No Bathing: No Appetite: No Relationship With Others: No Bladder Continence: No Emotions: No Bowel Continence: No Work: No Toileting: No Drive: No Dressing:  No Hobbies: No Electronic Signature(s) Signed: 12/15/2021 6:17:50 AM By: [name], [name] Entered By: Baruch Gouty on 12/12/2021 10:21:31 -------------------------------------------------------------------------------- Patient/Caregiver Education Details Patient Name: Date of Service: Michael [name], Michael LTER [name]. 10/13/2023andnbsp10:15 A M Medical Record Number: [id number] Patient Account Number: [id number] Date of Birth/Gender: Treating RN: 09/21/1950 (71 y.o. Ernestene Mention Primary Care Physician: Merrily Pew, HA NNA H Other Clinician: Referring Physician: Treating Physician/Extender: Leigh Aurora, HA NNA H Weeks in Treatment: 1 Education Assessment Education Provided To: Patient Education Topics Provided Infection: Methods: Explain/Verbal Responses: Reinforcements needed, State content correctly Venous: Methods: Explain/Verbal Responses: Reinforcements needed, State content correctly Wound/Skin Impairment: Methods: Explain/Verbal Responses: Reinforcements needed, State content correctly Michael [name], Michael [name] ([id number]) [id number] Page 6 of 7 Electronic Signature(s) Signed: 12/15/2021 6:17:50 AM By: [name], [name] Entered By: Baruch Gouty on 12/12/2021 10:40:06 -------------------------------------------------------------------------------- Wound Assessment Details Patient Name: Date of Service: Michael [name], [name] 12/12/2021 10:15 A M Medical Record Number: [id number] Patient Account Number: [id number] Date of Birth/Sex: Treating RN: 1950/04/27 (72 y.o. Ernestene Mention Primary Care Dannon Perlow: Merrily Pew, HA NNA H Other Clinician: Referring Chemeka Filice: Treating Zyasia Halbleib/Extender: Cain Sieve MEY, HA NNA H Weeks in Treatment: 1 Wound Status Wound Number: 3 Primary Diabetic Wound/Ulcer of the Lower Extremity Etiology: Wound Location: Right, Anterior Lower Leg Wound Open Wounding Event: Blister Status: Date  Acquired: 09/30/2021 Comorbid Chronic sinus problems/congestion, Hypertension, Peripheral Weeks Of Treatment: 1 History: Arterial Disease, Peripheral Venous Disease, Type II Diabetes Clustered Wound: No Photos Wound Measurements Length: (cm) 5.5 Width: (cm) 3.5 Depth: (cm) 0.2 Area: (cm) 15.119 Volume: (cm) 3.024 % Reduction in Area: 13.9% % Reduction in Volume: 42.6% Epithelialization: Small (1-33%) Tunneling: No Undermining: No Wound Description Classification: Grade 2 Wound Margin: Distinct, outline attached Exudate Amount: Medium Exudate Type: Serosanguineous Exudate Color: red, brown Foul Odor After Cleansing: No Slough/Fibrino Yes Wound Bed Granulation Amount: Small (1-33%) Exposed Structure Granulation Quality: Red Fascia Exposed: No Necrotic Amount: Large (67-100%) Fat Layer (Subcutaneous Tissue) Exposed: Yes Necrotic Quality: Adherent Slough Tendon Exposed: No Muscle Exposed: No Joint Exposed: No Bone Exposed: No Periwound Skin Texture Texture Color No Abnormalities Noted: Yes No Abnormalities Noted: Yes Moisture Temperature / Pain No Abnormalities Noted: Yes Temperature: No Abnormality Tenderness on Palpation: Yes Michael [name], Michael [name] ([id number]) [id number] Page 7 of 7 Treatment Notes Wound #3 (Lower Leg) Wound Laterality: Right, Anterior Cleanser Peri-Wound Care Sween Lotion (Moisturizing lotion) Discharge Instruction: Apply moisturizing lotion as directed Topical Gentamicin Discharge Instruction: As directed by physician Primary Dressing Santyl Ointment Discharge Instruction: Apply nickel thick amount to wound bed as instructed Secondary Dressing ABD Pad, 8x10 Discharge Instruction: Apply over primary dressing as directed. Woven Gauze Sponge, Non-Sterile 4x4 in Discharge Instruction: Apply over primary dressing as directed. Secured With [employer] 4x5 (in/yd) Discharge Instruction: Secure with Coban  as  directed. Stretch Net Size 5, 10 (yds) Compression Wrap Compression Stockings Add-Ons Electronic Signature(s) Signed: 12/15/2021 6:17:50 AM By: [name], [name] Entered By: Baruch Gouty on 12/12/2021 10:30:48 -------------------------------------------------------------------------------- Vitals Details Patient Name: Date of Service: Michael [name], Michael LTER [name]. 12/12/2021 10:15 A M Medical Record Number: [id number] Patient Account Number: [id number] Date of Birth/Sex: Treating RN: [birth year] (71 y.o. Ernestene Mention Primary Care Perez Dirico: Merrily Pew, HA NNA H Other Clinician: Referring Joline Encalada: Treating Carlisle Enke/Extender: Cain Sieve MEY, HA NNA H Weeks in Treatment: 1 Vital Signs Time Taken: 10:18 Temperature (F): 98.6 Height (in): 72 Pulse (bpm): 93 Weight (lbs): 208 Respiratory Rate (breaths/min): 18 Body Mass Index (BMI): 28.2 Blood Pressure (mmHg): 135/71 Capillary Blood Glucose (mg/dl): 135 Reference Range: 80 - 120 mg / dl Notes glucose per tp report this am Electronic Signature(s) Signed: 12/15/2021 6:17:50 AM By: [name], [name] Entered By: Baruch Gouty on 12/12/2021 10:20:39

## 2021-12-17 ENCOUNTER — Telehealth: Payer: Self-pay

## 2021-12-17 ENCOUNTER — Other Ambulatory Visit: Payer: Self-pay

## 2021-12-17 DIAGNOSIS — Z794 Long term (current) use of insulin: Secondary | ICD-10-CM | POA: Diagnosis not present

## 2021-12-17 DIAGNOSIS — Z4800 Encounter for change or removal of nonsurgical wound dressing: Secondary | ICD-10-CM | POA: Diagnosis not present

## 2021-12-17 DIAGNOSIS — Z7984 Long term (current) use of oral hypoglycemic drugs: Secondary | ICD-10-CM | POA: Diagnosis not present

## 2021-12-17 DIAGNOSIS — I7025 Atherosclerosis of native arteries of other extremities with ulceration: Secondary | ICD-10-CM

## 2021-12-17 DIAGNOSIS — F419 Anxiety disorder, unspecified: Secondary | ICD-10-CM | POA: Diagnosis not present

## 2021-12-17 DIAGNOSIS — L97815 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: Secondary | ICD-10-CM | POA: Diagnosis not present

## 2021-12-17 DIAGNOSIS — Z7982 Long term (current) use of aspirin: Secondary | ICD-10-CM | POA: Diagnosis not present

## 2021-12-17 DIAGNOSIS — T827XXD Infection and inflammatory reaction due to other cardiac and vascular devices, implants and grafts, subsequent encounter: Secondary | ICD-10-CM | POA: Diagnosis not present

## 2021-12-17 DIAGNOSIS — I10 Essential (primary) hypertension: Secondary | ICD-10-CM | POA: Diagnosis not present

## 2021-12-17 DIAGNOSIS — Z7902 Long term (current) use of antithrombotics/antiplatelets: Secondary | ICD-10-CM | POA: Diagnosis not present

## 2021-12-17 DIAGNOSIS — E1151 Type 2 diabetes mellitus with diabetic peripheral angiopathy without gangrene: Secondary | ICD-10-CM | POA: Diagnosis not present

## 2021-12-17 DIAGNOSIS — I739 Peripheral vascular disease, unspecified: Secondary | ICD-10-CM

## 2021-12-17 DIAGNOSIS — Z79891 Long term (current) use of opiate analgesic: Secondary | ICD-10-CM | POA: Diagnosis not present

## 2021-12-17 NOTE — Telephone Encounter (Signed)
Pt called requesting Gabapentin or Lyrica prescription, as he feels he is having nerve pain in his RLE after the wound center "dug around deep" in the wound this week. He has been advised this will have to go through a pain management provider. He is agreeable to this plan and I have let APP know so we can refer pt. Pt is aware and verbalized understanding.

## 2021-12-18 ENCOUNTER — Ambulatory Visit (HOSPITAL_BASED_OUTPATIENT_CLINIC_OR_DEPARTMENT_OTHER): Payer: Medicare Other | Admitting: Internal Medicine

## 2021-12-22 ENCOUNTER — Encounter (HOSPITAL_BASED_OUTPATIENT_CLINIC_OR_DEPARTMENT_OTHER): Payer: Medicare Other | Admitting: General Surgery

## 2021-12-22 DIAGNOSIS — E1151 Type 2 diabetes mellitus with diabetic peripheral angiopathy without gangrene: Secondary | ICD-10-CM | POA: Diagnosis not present

## 2021-12-22 DIAGNOSIS — Z6828 Body mass index (BMI) 28.0-28.9, adult: Secondary | ICD-10-CM | POA: Diagnosis not present

## 2021-12-22 DIAGNOSIS — I129 Hypertensive chronic kidney disease with stage 1 through stage 4 chronic kidney disease, or unspecified chronic kidney disease: Secondary | ICD-10-CM | POA: Diagnosis not present

## 2021-12-22 DIAGNOSIS — J069 Acute upper respiratory infection, unspecified: Secondary | ICD-10-CM | POA: Diagnosis not present

## 2021-12-22 DIAGNOSIS — E11622 Type 2 diabetes mellitus with other skin ulcer: Secondary | ICD-10-CM | POA: Diagnosis not present

## 2021-12-22 DIAGNOSIS — E669 Obesity, unspecified: Secondary | ICD-10-CM | POA: Diagnosis not present

## 2021-12-22 DIAGNOSIS — I739 Peripheral vascular disease, unspecified: Secondary | ICD-10-CM | POA: Diagnosis not present

## 2021-12-22 DIAGNOSIS — L97812 Non-pressure chronic ulcer of other part of right lower leg with fat layer exposed: Secondary | ICD-10-CM | POA: Diagnosis not present

## 2021-12-22 DIAGNOSIS — N189 Chronic kidney disease, unspecified: Secondary | ICD-10-CM | POA: Diagnosis not present

## 2021-12-22 DIAGNOSIS — I70239 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: Secondary | ICD-10-CM | POA: Diagnosis not present

## 2021-12-22 DIAGNOSIS — E1122 Type 2 diabetes mellitus with diabetic chronic kidney disease: Secondary | ICD-10-CM | POA: Diagnosis not present

## 2021-12-24 DIAGNOSIS — Z8546 Personal history of malignant neoplasm of prostate: Secondary | ICD-10-CM | POA: Diagnosis not present

## 2021-12-24 DIAGNOSIS — Z794 Long term (current) use of insulin: Secondary | ICD-10-CM | POA: Diagnosis not present

## 2021-12-24 DIAGNOSIS — E1151 Type 2 diabetes mellitus with diabetic peripheral angiopathy without gangrene: Secondary | ICD-10-CM | POA: Diagnosis not present

## 2021-12-30 ENCOUNTER — Encounter (HOSPITAL_BASED_OUTPATIENT_CLINIC_OR_DEPARTMENT_OTHER): Payer: Medicare Other | Admitting: General Surgery

## 2021-12-30 DIAGNOSIS — E11622 Type 2 diabetes mellitus with other skin ulcer: Secondary | ICD-10-CM | POA: Diagnosis not present

## 2021-12-30 DIAGNOSIS — I70239 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: Secondary | ICD-10-CM | POA: Diagnosis not present

## 2021-12-30 DIAGNOSIS — E1122 Type 2 diabetes mellitus with diabetic chronic kidney disease: Secondary | ICD-10-CM | POA: Diagnosis not present

## 2021-12-30 DIAGNOSIS — I129 Hypertensive chronic kidney disease with stage 1 through stage 4 chronic kidney disease, or unspecified chronic kidney disease: Secondary | ICD-10-CM | POA: Diagnosis not present

## 2021-12-30 DIAGNOSIS — L97812 Non-pressure chronic ulcer of other part of right lower leg with fat layer exposed: Secondary | ICD-10-CM | POA: Diagnosis not present

## 2021-12-30 DIAGNOSIS — Z6828 Body mass index (BMI) 28.0-28.9, adult: Secondary | ICD-10-CM | POA: Diagnosis not present

## 2021-12-30 DIAGNOSIS — N189 Chronic kidney disease, unspecified: Secondary | ICD-10-CM | POA: Diagnosis not present

## 2021-12-30 DIAGNOSIS — E669 Obesity, unspecified: Secondary | ICD-10-CM | POA: Diagnosis not present

## 2021-12-30 DIAGNOSIS — E1151 Type 2 diabetes mellitus with diabetic peripheral angiopathy without gangrene: Secondary | ICD-10-CM | POA: Diagnosis not present

## 2021-12-30 NOTE — Progress Notes (Signed)
LATREL, SZYMCZAK ([id number]) [id number] Page 1 of 7 Visit Report for 12/30/2021 Arrival Information Details Patient Name: Date of Service: Michael [name], Michael C. 12/30/2021 9:30 A M Medical Record Number: [id number] Patient Account Number: [id number] Date of Birth/Sex: Treating RN: [birth year] (71 y.o. M) Primary Care Averee Harb: [name] Other Clinician: Referring Jovin Fester: Treating Standley Bargo/Extender: [name] in Treatment: 3 Visit Information History Since Last Visit All ordered tests and consults were completed: Yes Patient Arrived: Ambulatory Added or deleted any medications: No Arrival Time: 09:21 Any new allergies or adverse reactions: No Accompanied By: wife Had a fall or experienced change in No Transfer Assistance: Manual activities of daily living that may affect Patient Identification Verified: Yes risk of falls: Secondary Verification Process Completed: Yes Signs or symptoms of abuse/neglect since last visito No Patient Requires Transmission-Based Precautions: No Hospitalized since last visit: No Patient Has Alerts: Yes Implantable device outside of the clinic excluding No Patient Alerts: Patient on Blood Thinner cellular tissue based products placed in the center plavix since last visit: Pain Present Now: Yes Electronic Signature(s) Signed: 12/30/2021 10:15:59 AM By: Worthy Rancher Entered By: Worthy Rancher on 12/30/2021 09:22:49 -------------------------------------------------------------------------------- Encounter Discharge Information Details Patient Name: Date of Service: Michael [name], Michael C. 12/30/2021 9:30 A M Medical Record Number: [id number] Patient Account Number: [id number] Date of Birth/Sex: Treating RN: [birth year] (71 y.o. [name] Primary Care Quantavis Obryant: [name] Other Clinician: Referring Keir Foland: Treating Shawnetta Lein/Extender: [name] in  Treatment: 3 Encounter Discharge Information Items Post Procedure Vitals Discharge Condition: Stable Temperature (F): 97.4 Ambulatory Status: Ambulatory Pulse (bpm): 116 Discharge Destination: Home Respiratory Rate (breaths/min): 20 Transportation: Private Auto Blood Pressure (mmHg): 112/64 Accompanied By: wife Schedule Follow-up Appointment: Yes Clinical Summary of Care: Electronic Signature(s) Signed: 12/30/2021 4:16:20 PM By: [name] Entered By: Blanche East on 12/30/2021 10:05:12 [name] ([id number]) [id number] Page 2 of 7 -------------------------------------------------------------------------------- Lower Extremity Assessment Details Patient Name: Date of Service: Michael [name], Michael C. 12/30/2021 9:30 A M Medical Record Number: [id number] Patient Account Number: [id number] Date of Birth/Sex: Treating RN: 1950/08/21 (71 y.o. M) Primary Care Daronte Shostak: [name] Other Clinician: Referring Abri Vacca: Treating Jolyn Deshmukh/Extender: [name] in Treatment: 3 Edema Assessment Assessed: [Left: No] [Right: No] Edema: [Left: Ye] [Right: s] Calf Left: Right: Point of Measurement: 35 cm From Medial Instep 34.5 cm Ankle Left: Right: Point of Measurement: 12 cm From Medial Instep 22.5 cm Electronic Signature(s) Signed: 12/30/2021 10:15:59 AM By: Worthy Rancher Entered By: Worthy Rancher on 12/30/2021 09:23:44 -------------------------------------------------------------------------------- Multi Wound Chart Details Patient Name: Date of Service: Michael [name], Michael C. 12/30/2021 9:30 A M Medical Record Number: [id number] Patient Account Number: [id number] Date of Birth/Sex: Treating RN: 22-Apr-1950 (71 y.o. M) Primary Care Brittin Belnap: [name] Other Clinician: Referring Balbina Depace: Treating Esma Kilts/Extender: [name] in Treatment: 3 Vital Signs Height(in): 72 Pulse(bpm):  116 Weight(lbs): 208 Blood Pressure(mmHg): 112/64 Body Mass Index(BMI): 28.2 Temperature(F): 97.4 Respiratory Rate(breaths/min): 20 [3:Photos:] [[name]/A:[name]/A] Right, Anterior Lower Leg [name]/A [name]/A Wound Location: Blister [name]/A [name]/A Wounding Event: Diabetic Wound/Ulcer of the Lower [name]/A [name]/A Primary Etiology: Extremity Chronic sinus problems/congestion, [name]/A [name]/A Comorbid History: Hypertension, Peripheral Arterial Creary, Tarus C ([id number]) [id number] Page 3 of 7 Disease, Peripheral Venous Disease, Type II Diabetes 09/30/2021 [name]/A [name]/A Date Acquired: 3 [name]/A [name]/A Weeks of Treatment: Open [name]/A [name]/A Wound Status: No [name]/A [name]/A Wound Recurrence: 5x4x0.2 [name]/A [name]/A Measurements L x W x D (cm)  15.708 [name]/A [name]/A A (cm) : rea 3.142 [name]/A [name]/A Volume (cm) : 10.60% [name]/A [name]/A % Reduction in A rea: 40.40% [name]/A [name]/A % Reduction in Volume: Grade 2 [name]/A [name]/A Classification: Medium [name]/A [name]/A Exudate A mount: Serosanguineous [name]/A [name]/A Exudate Type: red, brown [name]/A [name]/A Exudate Color: Distinct, outline attached [name]/A [name]/A Wound Margin: Small (1-33%) [name]/A [name]/A Granulation A mount: Red, Pink [name]/A [name]/A Granulation Quality: Large (67-100%) [name]/A [name]/A Necrotic A mount: Fat Layer (Subcutaneous Tissue): Yes [name]/A [name]/A Exposed Structures: Fascia: No Tendon: No Muscle: No Joint: No Bone: No Small (1-33%) [name]/A [name]/A Epithelialization: Debridement - Excisional [name]/A [name]/A Debridement: Pre-procedure Verification/Time Out 09:47 [name]/A [name]/A Taken: Lidocaine 5% topical ointment [name]/A [name]/A Pain Control: Subcutaneous, Slough [name]/A [name]/A Tissue Debrided: Skin/Subcutaneous Tissue [name]/A [name]/A Level: 20 [name]/A [name]/A Debridement A (sq cm): rea Curette [name]/A [name]/A Instrument: Minimum [name]/A [name]/A Bleeding: Pressure [name]/A [name]/A Hemostasis A chieved: 3 [name]/A [name]/A Procedural Pain: 0 [name]/A [name]/A Post Procedural Pain: Procedure was tolerated well [name]/A [name]/A Debridement Treatment Response: 5x4x0.2 [name]/A [name]/A Post Debridement Measurements L x W x D  (cm) 3.142 [name]/A [name]/A Post Debridement Volume: (cm) No Abnormalities Noted [name]/A [name]/A Periwound Skin Texture: No Abnormalities Noted [name]/A [name]/A Periwound Skin Moisture: Atrophie Blanche: No [name]/A [name]/A Periwound Skin Color: Cyanosis: No Erythema: No No Abnormality [name]/A [name]/A Temperature: Yes [name]/A [name]/A Tenderness on Palpation: Debridement [name]/A [name]/A Procedures Performed: Treatment Notes Electronic Signature(s) Signed: 12/30/2021 9:53:18 AM By: [name] Entered By: Fredirick Maudlin on 12/30/2021 09:53:18 -------------------------------------------------------------------------------- Multi-Disciplinary Care Plan Details Patient Name: Date of Service: Michael [name], Michael C. 12/30/2021 9:30 A M Medical Record Number: [id number] Patient Account Number: [id number] Date of Birth/Sex: Treating RN: January 15, 1951 (71 y.o. [name] Primary Care Nitzia Perren: [name] Other Clinician: Referring Falen Lehrmann: Treating Kirtis Challis/Extender: [name] in Treatment: 3 Active Inactive Nutrition ORRIE, SCHUBERT ([id number]) [id number] Page 4 of 7 Nursing Diagnoses: Potential for alteratiion in Nutrition/Potential for imbalanced nutrition Goals: Patient/caregiver agrees to and verbalizes understanding of need to use nutritional supplements and/or vitamins as prescribed Date Initiated: 12/04/2021 Target Resolution Date: 12/26/2021 Goal Status: Active Patient/caregiver verbalizes understanding of need to maintain therapeutic glucose control per primary care physician Date Initiated: 12/04/2021 Target Resolution Date: 12/26/2021 Goal Status: Active Interventions: Assess patient nutrition upon admission and as needed per policy Provide education on elevated blood sugars and impact on wound healing Provide education on nutrition Treatment Activities: Education provided on Nutrition : 12/22/2021 Notes: Wound/Skin Impairment Nursing  Diagnoses: Impaired tissue integrity Knowledge deficit related to ulceration/compromised skin integrity Goals: Patient/caregiver will verbalize understanding of skin care regimen Date Initiated: 12/04/2021 Target Resolution Date: 12/26/2021 Goal Status: Active Ulcer/skin breakdown will have a volume reduction of 30% by week 4 Date Initiated: 12/04/2021 Target Resolution Date: 12/26/2021 Goal Status: Active Interventions: Assess ulceration(s) every visit Provide education on ulcer and skin care Notes: Electronic Signature(s) Signed: 12/30/2021 4:16:20 PM By: [name] Entered By: Blanche East on 12/30/2021 09:45:20 -------------------------------------------------------------------------------- Pain Assessment Details Patient Name: Date of Service: Michael [name], Michael C. 12/30/2021 9:30 A M Medical Record Number: [id number] Patient Account Number: [id number] Date of Birth/Sex: Treating RN: 05/25/1950 (71 y.o. M) Primary Care Quamesha Mullet: [name] Other Clinician: Referring Jefry Lesinski: Treating Jonnie Kubly/Extender: [name] in Treatment: 3 Active Problems Location of Pain Severity and Description of Pain Patient Has Paino Yes Site Locations Pain LocationALESANDRO, Michael [name] ([id number]) [id number] Page 5 of 7 Pain Location: Pain in Ulcers Rate the  pain. Current Pain Level: 4 Worst Pain Level: 10 Least Pain Level: 0 Tolerable Pain Level: 6 Pain Management and Medication Current Pain Management: Electronic Signature(s) Signed: 12/30/2021 10:15:59 AM By: Worthy Rancher Entered By: Worthy Rancher on 12/30/2021 09:23:39 -------------------------------------------------------------------------------- Patient/Caregiver Education Details Patient Name: Date of Service: Michael [name], [place]. 10/31/2023andnbsp9:30 [place] Record Number: [id number] Patient Account Number: [id number] Date of Birth/Gender: Treating  RN: 1950-07-10 (71 y.o. [name] Primary Care Physician: [name] Other Clinician: Referring Physician: Treating Physician/Extender: [name] in Treatment: 3 Education Assessment Education Provided To: Patient Education Topics Provided Elevated Blood Sugar/ Impact on Healing: Methods: Explain/Verbal Responses: Reinforcements needed, State content correctly Nutrition: Methods: Explain/Verbal Responses: Reinforcements needed, State content correctly Wound/Skin Impairment: Methods: Explain/Verbal Responses: Reinforcements needed, State content correctly Electronic Signature(s) Signed: 12/30/2021 4:16:20 PM By: [name] Entered By: Blanche East on 12/30/2021 09:45:40 [name] ([id number]) [id number] Page 6 of 7 -------------------------------------------------------------------------------- Wound Assessment Details Patient Name: Date of Service: Michael [name], Michael C. 12/30/2021 9:30 A M Medical Record Number: [id number] Patient Account Number: [id number] Date of Birth/Sex: Treating RN: 1950/08/26 (71 y.o. [name] Primary Care Kaison Mcparland: [name] Other Clinician: Referring Azalie Harbeck: Treating Matilda Fleig/Extender: [name] in Treatment: 3 Wound Status Wound Number: 3 Primary Diabetic Wound/Ulcer of the Lower Extremity Etiology: Wound Location: Right, Anterior Lower Leg Wound Open Wounding Event: Blister Status: Date Acquired: 09/30/2021 Comorbid Chronic sinus problems/congestion, Hypertension, Peripheral Weeks Of Treatment: 3 History: Arterial Disease, Peripheral Venous Disease, Type II Diabetes Clustered Wound: No Photos Wound Measurements Length: (cm) 5 Width: (cm) 4 Depth: (cm) 0.2 Area: (cm) 15 Volume: (cm) 3. % Reduction in Area: 10.6% % Reduction in Volume: 40.4% Epithelialization: Small (1-33%) .708 Tunneling: No 142 Undermining:  No Wound Description Classification: Grade 2 Wound Margin: Distinct, outline attached Exudate Amount: Medium Exudate Type: Serosanguineous Exudate Color: red, brown Foul Odor After Cleansing: No Slough/Fibrino Yes Wound Bed Granulation Amount: Small (1-33%) Exposed Structure Granulation Quality: Red, Pink Fascia Exposed: No Necrotic Amount: Large (67-100%) Fat Layer (Subcutaneous Tissue) Exposed: Yes Tendon Exposed: No Muscle Exposed: No Joint Exposed: No Bone Exposed: No Periwound Skin Texture Texture Color No Abnormalities Noted: Yes No Abnormalities Noted: Yes Moisture Temperature / Pain No Abnormalities Noted: Yes Temperature: No Abnormality Tenderness on Palpation: Yes Treatment Notes Wound #3 (Lower Leg) Wound Laterality: Right, Anterior Cleanser Michael [name], Michael [name] ([id number]) [phone].pdf Page 7 of 7 Peri-Wound Care Sween Lotion (Moisturizing lotion) Discharge Instruction: Apply moisturizing lotion as directed Topical Gentamicin Discharge Instruction: As directed by physician Primary Dressing Santyl Ointment Discharge Instruction: Apply nickel thick amount to wound bed as instructed Secondary Dressing ABD Pad, 8x10 Discharge Instruction: Apply over primary dressing as directed. Woven Gauze Sponge, Non-Sterile 4x4 in Discharge Instruction: Apply over primary dressing as directed. Secured With [employer] 4x5 (in/yd) Discharge Instruction: Secure with Coban as directed. Stretch Net Size 5, 10 (yds) Compression Wrap Compression Stockings Add-Ons Electronic Signature(s) Signed: 12/30/2021 4:16:20 PM By: [name] Entered By: Blanche East on 12/30/2021 09:42:55 -------------------------------------------------------------------------------- Vitals Details Patient Name: Date of Service: Michael [name], Michael LTER C. 12/30/2021 9:30 A M Medical Record Number: [id number] Patient Account Number: [id number] Date of  Birth/Sex: Treating RN: [birth year] (71 y.o. M) Primary Care Morganna Styles: [name] Other Clinician: Referring Karma Hiney: Treating Arby Dahir/Extender: [name] in Treatment: 3 Vital Signs Time Taken: 09:15 Temperature (F): 97.4 Height (in): 72 Pulse (bpm): 116 Weight (lbs): 208 Respiratory  Rate (breaths/min): 20 Body Mass Index (BMI): 28.2 Blood Pressure (mmHg): 112/64 Reference Range: 80 - 120 mg / dl Electronic Signature(s) Signed: 12/30/2021 10:15:59 AM By: Worthy Rancher Entered By: Worthy Rancher on 12/30/2021 09:23:15

## 2021-12-30 NOTE — Progress Notes (Signed)
NILAY, MANGRUM ([id number]) [id number] Page 1 of 9 Visit Report for 12/30/2021 Chief Complaint Document Details Patient Name: Date of Service: HO FFMA [place], [name] 12/30/2021 9:30 A M Medical Record Number: [id number] Patient Account Number: [id number] Date of Birth/Sex: Treating RN: 1950/09/22 (71 y.o. M) Primary Care Provider: [name] Other Clinician: Referring Provider: Treating Provider/Extender: [name] in Treatment: 3 Information Obtained from: Patient Chief Complaint Patient presents to the wound care center today with an open arterial ulcer to the right lower extremity in the setting of diabetes mellitus. he has had this problem for 7 months 12/04/2021: ulcer to right lower anterior tibial surface Electronic Signature(s) Signed: 12/30/2021 9:53:28 AM By: [name] Entered By: Fredirick Maudlin on 12/30/2021 09:53:28 -------------------------------------------------------------------------------- Debridement Details Patient Name: Date of Service: [name], [place] C. 12/30/2021 9:30 A M Medical Record Number: [id number] Patient Account Number: [id number] Date of Birth/Sex: Treating RN: 08/06/1950 (71 y.o. [name] Primary Care Provider: [name] Other Clinician: Referring Provider: Treating Provider/Extender: [name] in Treatment: 3 Debridement Performed for Assessment: Wound #3 Right,Anterior Lower Leg Performed By: Physician Fredirick Maudlin, MD Debridement Type: Debridement Severity of Tissue Pre Debridement: Fat layer exposed Level of Consciousness (Pre-procedure): Awake and Alert Pre-procedure Verification/Time Out Yes - 09:47 Taken: Start Time: 09:48 Pain Control: Lidocaine 5% topical ointment T Area Debrided (L x W): otal 5 (cm) x 4 (cm) = 20 (cm) Tissue and other material debrided: Viable, Non-Viable, Slough, Subcutaneous,  Slough Level: Skin/Subcutaneous Tissue Debridement Description: Excisional Instrument: Curette Bleeding: Minimum Hemostasis Achieved: Pressure Procedural Pain: 3 Post Procedural Pain: 0 Response to Treatment: Procedure was tolerated well Level of Consciousness (Post- Awake and Alert procedure): Post Debridement Measurements of Total Wound Length: (cm) 5 Width: (cm) 4 Depth: (cm) 0.2 Volume: (cm) 3.142 Character of Wound/Ulcer Post Debridement: Requires Further Debridement TAYVIAN, HOLYCROSS ([id number]) [id number] Page 2 of 9 Severity of Tissue Post Debridement: Fat layer exposed Post Procedure Diagnosis Same as Pre-procedure Notes Scribed for Dr. Celine Ahr by Blanche East, RN Electronic Signature(s) Signed: 12/30/2021 11:15:04 AM By: [name] Signed: 12/30/2021 4:16:20 PM By: [name] Entered By: Blanche East on 12/30/2021 09:51:36 -------------------------------------------------------------------------------- HPI Details Patient Name: Date of Service: HO [place], [place] C. 12/30/2021 9:30 A M Medical Record Number: [id number] Patient Account Number: [id number] Date of Birth/Sex: Treating RN: 1951/01/28 (71 y.o. M) Primary Care Provider: [name] Other Clinician: Referring Provider: Treating Provider/Extender: [name] in Treatment: 3 History of Present Illness Location: right lateral calf closer to the knee Quality: Patient reports experiencing a dull pain to affected area(s). Severity: Patient states wound(s) are getting worse. Duration: Patient has had the wound for > 7 months prior to seeking treatment at the wound center Timing: Pain in wound is constant (hurts all the time) Context: The wound would happen gradually Modifying Factors: Patient wound(s)/ulcer(s) are worsening due to : no resolution and a white material at the base of the wound ssociated Signs and Symptoms: Patient  reports having:no discharge or purulent material A HPI Description: 71 year old gentleman who has been referred to was from his PCP for a chronic ulceration on his right lower extremity which she's had for several weeks. Past medical history significant for diabetes mellitus type 2, hypertension, hyperlipidemia, anxiety, obesity, peripheral vascular disease and chronic kidney disease. He has never been a smoker. Most recent lab work done at his PCPs office showed a glucose of  217 milligrams per deciliter which is consistent with hyperglycemia. In January 2017 recent hemoglobin A1c values noted to be 8.2%. In April 2017, he has been seen at the vascular office by Dr. Trula Slade and Dr. Kellie Simmering for right leg claudication. He had a right superficial femoral artery stent in September 2012. Recent noninvasive vascular imaging done on 06/24/2015 showed a right ABI of 1.07 with a triphasic waveform and a right TBI of 0.81. The left was noncompressible with a triphasic waveform and a TBI of 1.17. Right lower extremity arterial duplex was unable to identify stent exit but they were elevated velocities at the stent and in the distal thigh with triphasic waveforms. Dr. Stephens Shire opinion was to return to the clinic in 6 months with ABI and right lower extremity arterial duplex studies to be done. He has seen a dermatologist who had done a biopsy and said it was benign and he was given a steroid cream. 10/23/2015 -- Pathology report of the biopsy done in April 2017 shows ulcer with underlying angiodermatitis consistent with stasis dermatitis. 10/30/2015 -- he is going for shoulder surgery in about 10 days' time and was asking about the perioperative care. His blood sugars are running in the high 100s or low 200s. No hemoglobin A1c done recently. 11/20/2015 -- is back up to 2 weeks because of recent left shoulder surgery. 12/04/15; patient returns today with the wound for the most part looking healthy. No evidence of  infection no debridement required. He is using Prisma for 4 weeks, without obvious improvement per her intake nurse. This started as several small open areas that were raised erythematous. He saw dermatology and at some point this was biopsied that just suggested stasis skin physiology. This certainly doesn't look like that 12/11/15; using Hydrafera Blue. Previous biopsy reviewed, no atypia PAS negative. He has a history of stents in the right leg however he does not appear to have primary arterial insufficiency ABI in this clinic was over 0.9. 12/25/2015 -- he has been approved for grafix and we will order for some to be applied next week 01/01/2016 -- he has had his first application of Grafix today 01/08/2016 -- he has had his second application of Grafix today READMISSION 12/04/2021 This is a now 71 year old man who was followed in our clinic about 5 years ago for an ulceration on his right lateral lower leg, just distal to the knee. He has since undergone a number of revascularization procedures that have been complicated by restenosis and wound infections. During the course of this treatment, he developed a small ulcer on his right anterior tibial surface. Despite use of an Unna boot, the wound has continued to expand. He was referred to the wound care center by Dr. Trula Slade for further evaluation and management. On the right anterior tibial surface, there is an irregular wound with heavy slough and eschar accumulation. The periwound skin is intact but he does have 1-2+ pitting edema. There is no purulent drainage or malodor. 10/13; second visit for this man who has an ischemic wound in the setting of type 2 diabetes on the right anterior mid tibia area. He has been revascularized. Using Santyl gent 12/22/2021: The wound is about the same size this week. There is a lot of gray sloughy material on the surface, secondary to silver nitrate used to stop bleeding after what sounds like a fairly  aggressive debridement last week. EZEKEIL, BETHEL ([id number]) [id number] Page 3 of 9 12/30/2021: The wound is smaller this week and a  bit cleaner. Edema control is excellent. There is still a bit of slough accumulation. Electronic Signature(s) Signed: 12/30/2021 9:54:11 AM By: [name] Entered By: Fredirick Maudlin on 12/30/2021 09:54:11 -------------------------------------------------------------------------------- Physical Exam Details Patient Name: Date of Service: [name], [name] 12/30/2021 9:30 A M Medical Record Number: [id number] Patient Account Number: [id number] Date of Birth/Sex: Treating RN: 1950/12/31 (71 y.o. M) Primary Care Provider: [name] Other Clinician: Referring Provider: Treating Provider/Extender: [name] in Treatment: 3 Constitutional .Tachycardic, asymptomatic.. . . No acute distress.Marland Kitchen Respiratory Normal work of breathing on room air.. Notes 12/30/2021: The wound is smaller this week and a bit cleaner. Edema control is excellent. There is still a bit of slough accumulation. Electronic Signature(s) Signed: 12/30/2021 9:54:43 AM By: [name] Entered By: Fredirick Maudlin on 12/30/2021 09:54:43 -------------------------------------------------------------------------------- Physician Orders Details Patient Name: Date of Service: [name], [place] C. 12/30/2021 9:30 A M Medical Record Number: [id number] Patient Account Number: [id number] Date of Birth/Sex: Treating RN: August 04, 1950 (71 y.o. [name] Primary Care Provider: [name] Other Clinician: Referring Provider: Treating Provider/Extender: [name] in Treatment: 3 Verbal / Phone Orders: No Diagnosis Coding ICD-10 Coding Code Description [phone] Non-pressure chronic ulcer of other part of right lower leg with fat layer exposed I70.239 Atherosclerosis of  native arteries of right leg with ulceration of unspecified site E11.622 Type 2 diabetes mellitus with other skin ulcer I73.9 Peripheral vascular disease, unspecified Follow-up Appointments ppointment in 1 week. - Dr. Celine Ahr - room 4 Return A Wednesday 01/07/22 at 3:15PM Anesthetic Wound #3 Right,Anterior Lower Leg (In clinic) Topical Lidocaine 5% applied to wound bed [address] FULTON, MERRY ([id number]) [id number] Page 4 of 9 May shower with protection but do not get wound dressing(s) wet. - purchase cast protector from [facility], [facility] or [employer] Edema Control - Lymphedema / SCD / Other Right Lower Extremity Elevate legs to the level of the heart or above for 30 minutes daily and/or when sitting, a frequency of: Avoid standing for long periods of time. Wound Treatment Wound #3 - Lower Leg Wound Laterality: Right, Anterior Peri-Wound Care: Sween Lotion (Moisturizing lotion) Discharge Instructions: Apply moisturizing lotion as directed Topical: Gentamicin Discharge Instructions: As directed by physician Prim Dressing: Santyl Ointment ary Discharge Instructions: Apply nickel thick amount to wound bed as instructed Secondary Dressing: ABD Pad, 8x10 Discharge Instructions: Apply over primary dressing as directed. Secondary Dressing: Woven Gauze Sponge, Non-Sterile 4x4 in Discharge Instructions: Apply over primary dressing as directed. Secured With: Coban Self-Adherent Wrap 4x5 (in/yd) Discharge Instructions: Secure with Coban as directed. Secured With: [employer] Size 5, 10 (yds) Electronic Signature(s) Signed: 12/30/2021 11:15:04 AM By: [name] Signed: 12/30/2021 4:16:20 PM By: [name] Entered By: Blanche East on 12/30/2021 10:57:30 -------------------------------------------------------------------------------- Problem List Details Patient Name: Date of Service: [name], [place] C. 12/30/2021 9:30 A  M Medical Record Number: [id number] Patient Account Number: [id number] Date of Birth/Sex: Treating RN: 1950-06-18 (71 y.o. M) Primary Care Provider: [name] Other Clinician: Referring Provider: Treating Provider/Extender: [name] in Treatment: 3 Active Problems ICD-10 Encounter Code Description Active Date MDM Diagnosis L97.812 Non-pressure chronic ulcer of other part of right lower leg with fat layer 12/04/2021 No Yes exposed I70.239 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 12/04/2021 No Yes E11.622 Type 2 diabetes mellitus with other skin ulcer 12/04/2021 No Yes I73.9 Peripheral vascular disease,  unspecified 12/04/2021 No Yes CHOYA, TORNOW ([id number]) [id number].pdf Page 5 of 9 Inactive Problems Resolved Problems Electronic Signature(s) Signed: 12/30/2021 9:53:11 AM By: [name] Entered By: Fredirick Maudlin on 12/30/2021 09:53:11 -------------------------------------------------------------------------------- Progress Note Details Patient Name: Date of Service: [name], [name] 12/30/2021 9:30 A M Medical Record Number: [id number] Patient Account Number: [id number] Date of Birth/Sex: Treating RN: [birth year] (71 y.o. M) Primary Care Provider: [name] Other Clinician: Referring Provider: Treating Provider/Extender: [name] in Treatment: 3 Subjective Chief Complaint Information obtained from Patient Patient presents to the wound care center today with an open arterial ulcer to the right lower extremity in the setting of diabetes mellitus. he has had this problem for 7 months 12/04/2021: ulcer to right lower anterior tibial surface History of Present Illness (HPI) The following HPI elements were documented for the patient's wound: Location: right lateral calf closer to the knee Quality: Patient reports experiencing a  dull pain to affected area(s). Severity: Patient states wound(s) are getting worse. Duration: Patient has had the wound for > 7 months prior to seeking treatment at the wound center Timing: Pain in wound is constant (hurts all the time) Context: The wound would happen gradually Modifying Factors: Patient wound(s)/ulcer(s) are worsening due to : no resolution and a white material at the base of the wound Associated Signs and Symptoms: Patient reports having:no discharge or purulent material 71 year old gentleman who has been referred to was from his PCP for a chronic ulceration on his right lower extremity which she's had for several weeks. Past medical history significant for diabetes mellitus type 2, hypertension, hyperlipidemia, anxiety, obesity, peripheral vascular disease and chronic kidney disease. He has never been a smoker. Most recent lab work done at his PCPs office showed a glucose of 217 milligrams per deciliter which is consistent with hyperglycemia. In January 2017 recent hemoglobin A1c values noted to be 8.2%. In April 2017, he has been seen at the vascular office by Dr. Trula Slade and Dr. Kellie Simmering for right leg claudication. He had a right superficial femoral artery stent in September 2012. Recent noninvasive vascular imaging done on 06/24/2015 showed a right ABI of 1.07 with a triphasic waveform and a right TBI of 0.81. The left was noncompressible with a triphasic waveform and a TBI of 1.17. Right lower extremity arterial duplex was unable to identify stent exit but they were elevated velocities at the stent and in the distal thigh with triphasic waveforms. Dr. Stephens Shire opinion was to return to the clinic in 6 months with ABI and right lower extremity arterial duplex studies to be done. He has seen a dermatologist who had done a biopsy and said it was benign and he was given a steroid cream. 10/23/2015 -- Pathology report of the biopsy done in April 2017 shows ulcer with underlying  angiodermatitis consistent with stasis dermatitis. 10/30/2015 -- he is going for shoulder surgery in about 10 days' time and was asking about the perioperative care. His blood sugars are running in the high 100s or low 200s. No hemoglobin A1c done recently. 11/20/2015 -- is back up to 2 weeks because of recent left shoulder surgery. 12/04/15; patient returns today with the wound for the most part looking healthy. No evidence of infection no debridement required. He is using Prisma for 4 weeks, without obvious improvement per her intake nurse. This started as several small open areas that were raised erythematous. He saw dermatology and at some point this was biopsied that just suggested  stasis skin physiology. This certainly doesn't look like that 12/11/15; using Hydrafera Blue. Previous biopsy reviewed, no atypia PAS negative. He has a history of stents in the right leg however he does not appear to have primary arterial insufficiency ABI in this clinic was over 0.9. 12/25/2015 -- he has been approved for grafix and we will order for some to be applied next week 01/01/2016 -- he has had his first application of Grafix today 01/08/2016 -- he has had his second application of Grafix today READMISSION 12/04/2021 This is a now 71 year old man who was followed in our clinic about 5 years ago for an ulceration on his right lateral lower leg, just distal to the knee. He has since undergone a number of revascularization procedures that have been complicated by restenosis and wound infections. During the course of this treatment, he developed a small ulcer on his right anterior tibial surface. Despite use of an Unna boot, the wound has continued to expand. He was referred to the wound care center by Dr. Trula Slade for further evaluation and management. On the right anterior tibial surface, there is an irregular wound with heavy slough and eschar accumulation. The periwound skin is intact but he does have  1-2+ pitting edema. There is no purulent drainage or malodor. 10/13; second visit for this man who has an ischemic wound in the setting of type 2 diabetes on the right anterior mid tibia area. He has been revascularized. Using Santyl gent 12/22/2021: The wound is about the same size this week. There is a lot of gray sloughy material on the surface, secondary to silver nitrate used to stop SALOME, COZBY ([id number]) [id number] Page 6 of 9 bleeding after what sounds like a fairly aggressive debridement last week. 12/30/2021: The wound is smaller this week and a bit cleaner. Edema control is excellent. There is still a bit of slough accumulation. Patient History Information obtained from Patient. Family History Cancer - Siblings, Heart Disease - Father, Hypertension - Father,Mother, No family history of Hereditary Spherocytosis, Kidney Disease, Seizures, Stroke, Thyroid Problems, Tuberculosis. Social History Former smoker - smokeless tobacco, Marital Status - Married, Alcohol Use - Rarely - WINE, Drug Use - No History, Caffeine Use - Daily - COFFEE. Medical History Ear/Nose/Mouth/Throat Patient has history of Chronic sinus problems/congestion - seasonal allergies Cardiovascular Patient has history of Hypertension, Peripheral Arterial Disease - STENTS, Peripheral Venous Disease Endocrine Patient has history of Type II Diabetes - last A1c- 8.2 Hospitalization/Surgery History - left shoulder surgery. Medical A Surgical History Notes nd Constitutional Symptoms (General Health) obesity , h/o (R) leg claudication (right fem stent September 2012) Cardiovascular hyperlipidemia Endocrine pt. on diet intentionally losing 20 pounds since December 2016 in an effort to improve A1C levels Integumentary (Skin) psoriatic arthritis Musculoskeletal bilat knee pain identified as an ortho issue psoriatic arthritis Oncologic Prostate cancer  2018 Psychiatric anxiety Objective Constitutional Tachycardic, asymptomatic.Marland Kitchen No acute distress.. Vitals Time Taken: 9:15 AM, Height: 72 in, Weight: 208 lbs, BMI: 28.2, Temperature: 97.4 F, Pulse: 116 bpm, Respiratory Rate: 20 breaths/min, Blood Pressure: 112/64 mmHg. Respiratory Normal work of breathing on room air.. General Notes: 12/30/2021: The wound is smaller this week and a bit cleaner. Edema control is excellent. There is still a bit of slough accumulation. Integumentary (Hair, Skin) Wound #3 status is Open. Original cause of wound was Blister. The date acquired was: 09/30/2021. The wound has been in treatment 3 weeks. The wound is located on the Right,Anterior Lower Leg. The wound measures 5cm length x 4cm  width x 0.2cm depth; 15.708cm^2 area and 3.142cm^3 volume. There is Fat Layer (Subcutaneous Tissue) exposed. There is no tunneling or undermining noted. There is a medium amount of serosanguineous drainage noted. The wound margin is distinct with the outline attached to the wound base. There is small (1-33%) red, pink granulation within the wound bed. There is a large (67-100%) amount of necrotic tissue within the wound bed. The periwound skin appearance had no abnormalities noted for texture. The periwound skin appearance had no abnormalities noted for moisture. The periwound skin appearance had no abnormalities noted for color. Periwound temperature was noted as No Abnormality. The periwound has tenderness on palpation. Assessment Active Problems ICD-10 Non-pressure chronic ulcer of other part of right lower leg with fat layer exposed 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 Type 2 diabetes mellitus with other skin ulcer Peripheral vascular disease, unspecified JAMICHAEL, KNOTTS ([id number]) [id number] Page 7 of 9 Procedures Wound #3 Pre-procedure diagnosis of Wound #3 is a Diabetic Wound/Ulcer of the  Lower Extremity located on the Right,Anterior Lower Leg .Severity of Tissue Pre Debridement is: Fat layer exposed. There was a Excisional Skin/Subcutaneous Tissue Debridement with a total area of 20 sq cm performed by Fredirick Maudlin, MD. With the following instrument(s): Curette to remove Viable and Non-Viable tissue/material. Material removed includes Subcutaneous Tissue and Slough and after achieving pain control using Lidocaine 5% topical ointment. No specimens were taken. A time out was conducted at 09:47, prior to the start of the procedure. A Minimum amount of bleeding was controlled with Pressure. The procedure was tolerated well with a pain level of 3 throughout and a pain level of 0 following the procedure. Post Debridement Measurements: 5cm length x 4cm width x 0.2cm depth; 3.142cm^3 volume. Character of Wound/Ulcer Post Debridement requires further debridement. Severity of Tissue Post Debridement is: Fat layer exposed. Post procedure Diagnosis Wound #3: Same as Pre-Procedure General Notes: Scribed for Dr. Celine Ahr by Blanche East, RN. Plan Follow-up Appointments: Return Appointment in 1 week. - Dr. Celine Ahr - room 3 Wednesday 01/07/22 at 3:15PM Anesthetic: Wound #3 Right,Anterior Lower Leg: (In clinic) Topical Lidocaine 5% applied to wound bed Bathing/ Shower/ Hygiene: May shower with protection but do not get wound dressing(s) wet. - purchase cast protector from [facility], [facility] or [employer] Edema Control - Lymphedema / SCD / Other: Elevate legs to the level of the heart or above for 30 minutes daily and/or when sitting, a frequency of: Avoid standing for long periods of time. WOUND #3: - Lower Leg Wound Laterality: Right, Anterior Peri-Wound Care: Sween Lotion (Moisturizing lotion) Discharge Instructions: Apply moisturizing lotion as directed Topical: Gentamicin Discharge Instructions: As directed by physician Prim Dressing: Santyl Ointment ary Discharge Instructions: Apply nickel  thick amount to wound bed as instructed Secondary Dressing: ABD Pad, 8x10 Discharge Instructions: Apply over primary dressing as directed. Secondary Dressing: Woven Gauze Sponge, Non-Sterile 4x4 in Discharge Instructions: Apply over primary dressing as directed. Secured With: Coban Self-Adherent Wrap 4x5 (in/yd) Discharge Instructions: Secure with Coban as directed. Secured With: Stretch Net Size 5, 10 (yds) 12/30/2021: The wound is smaller this week and a bit cleaner. Edema control is excellent. There is still a bit of slough accumulation. I used a curette to debride slough and nonviable subcutaneous tissue from his wound. We will continue the combination of Santyl and topical gentamicin ointment with Kerlix and Coban wrap. Follow-up in 1 week. Electronic Signature(s) Signed: 12/30/2021 9:58:55 AM By: [name] Entered By: Fredirick Maudlin  on 12/30/2021 09:58:55 -------------------------------------------------------------------------------- HxROS Details Patient Name: Date of Service: [name], [name] 12/30/2021 9:30 A M Medical Record Number: [id number] Patient Account Number: [id number] Date of Birth/Sex: Treating RN: 1950-04-12 (71 y.o. M) Primary Care Provider: [name] Other Clinician: Referring Provider: Treating Provider/Extender: [name] in Treatment: 3 Information Obtained From Patient ELMAR, ANTIGUA ([id number]) [id number] Page 8 of 9 Constitutional Symptoms (General Health) Medical History: Past Medical History Notes: obesity , h/o (R) leg claudication (right fem stent September 2012) Ear/Nose/Mouth/Throat Medical History: Positive for: Chronic sinus problems/congestion - seasonal allergies Cardiovascular Medical History: Positive for: Hypertension; Peripheral Arterial Disease - STENTS; Peripheral Venous Disease Past Medical History Notes: hyperlipidemia Endocrine Medical  History: Positive for: Type II Diabetes - last A1c- 8.2 Past Medical History Notes: pt. on diet intentionally losing 20 pounds since December 2016 in an effort to improve A1C levels Time with diabetes: 9 YRS Treated with: Insulin Blood sugar tested every day: Yes Tested : 7-8 TIMES Integumentary (Skin) Medical History: Past Medical History Notes: psoriatic arthritis Musculoskeletal Medical History: Past Medical History Notes: bilat knee pain identified as an ortho issue psoriatic arthritis Oncologic Medical History: Past Medical History Notes: Prostate cancer 2018 Psychiatric Medical History: Past Medical History Notes: anxiety HBO Extended History Items Ear/Nose/Mouth/Throat: Chronic sinus problems/congestion Immunizations Pneumococcal Vaccine: Received Pneumococcal Vaccination: Yes Received Pneumococcal Vaccination On or After 60th Birthday: Yes Implantable Devices None Hospitalization / Surgery History Type of Hospitalization/Surgery left shoulder surgery Family and Social History Cancer: Yes - Siblings; Heart Disease: Yes - Father; Hereditary Spherocytosis: No; Hypertension: Yes - Father,Mother; Kidney Disease: No; Seizures: No; Stroke: No; Thyroid Problems: No; Tuberculosis: No; Former smoker - smokeless tobacco; Marital Status - Married; Alcohol Use: Rarely - WINE; Drug Use: No History; Caffeine Use: Daily - COFFEE; Financial Concerns: No; Food, Clothing or Shelter Needs: No; Support System Lacking: No; Transportation Concerns: No Electronic Signature(s) AWS, SHERE ([id number]) [id number] Page 9 of 9 Signed: 12/30/2021 11:15:04 AM By: [name] Entered By: Fredirick Maudlin on 12/30/2021 09:54:15 -------------------------------------------------------------------------------- SuperBill Details Patient Name: Date of Service: [name], [name] 12/30/2021 Medical Record Number: [id number] Patient Account Number:  [id number] Date of Birth/Sex: Treating RN: 1950-05-06 (71 y.o. M) Primary Care Provider: [name] Other Clinician: Referring Provider: Treating Provider/Extender: [name] in Treatment: 3 Diagnosis Coding ICD-10 Codes Code Description 2018509163 Non-pressure chronic ulcer of other part of right lower leg with fat layer exposed I70.239 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 E11.622 Type 2 diabetes mellitus with other skin ulcer I73.9 Peripheral vascular disease, unspecified Facility Procedures : CPT4 Code: 71696789 Description: 38101 - DEB SUBQ TISSUE 20 SQ CM/< ICD-10 Diagnosis Description L97.812 Non-pressure chronic ulcer of other part of right lower leg with fat layer expo Modifier: sed Quantity: 1 Physician Procedures : CPT4 Code Description Modifier [id number] 99214 - WC PHYS LEVEL 4 - EST PT 25 ICD-10 Diagnosis Description L97.812 Non-pressure chronic ulcer of other part of right lower leg with fat layer exposed E11.622 Type 2 diabetes mellitus with other skin ulcer  I70.239 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 I73.9 Peripheral vascular disease, unspecified Quantity: 1 : [id number] 23536 - WC PHYS SUBQ TISS 20 SQ CM ICD-10 Diagnosis Description R44.315 Non-pressure chronic ulcer of other part of right lower leg with fat layer exposed Quantity: 1 Electronic Signature(s) Signed: 12/30/2021 9:59:21 AM By: [name] Entered By:  Fredirick Maudlin on 12/30/2021 09:59:21

## 2022-01-01 DIAGNOSIS — I10 Essential (primary) hypertension: Secondary | ICD-10-CM | POA: Diagnosis not present

## 2022-01-01 DIAGNOSIS — N181 Chronic kidney disease, stage 1: Secondary | ICD-10-CM | POA: Diagnosis not present

## 2022-01-01 DIAGNOSIS — E1122 Type 2 diabetes mellitus with diabetic chronic kidney disease: Secondary | ICD-10-CM | POA: Diagnosis not present

## 2022-01-01 DIAGNOSIS — E782 Mixed hyperlipidemia: Secondary | ICD-10-CM | POA: Diagnosis not present

## 2022-01-07 ENCOUNTER — Encounter (HOSPITAL_BASED_OUTPATIENT_CLINIC_OR_DEPARTMENT_OTHER): Payer: Medicare Other | Attending: General Surgery | Admitting: General Surgery

## 2022-01-07 DIAGNOSIS — N189 Chronic kidney disease, unspecified: Secondary | ICD-10-CM | POA: Diagnosis not present

## 2022-01-07 DIAGNOSIS — L97815 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: Secondary | ICD-10-CM | POA: Insufficient documentation

## 2022-01-07 DIAGNOSIS — I70239 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: Secondary | ICD-10-CM | POA: Insufficient documentation

## 2022-01-07 DIAGNOSIS — E1151 Type 2 diabetes mellitus with diabetic peripheral angiopathy without gangrene: Secondary | ICD-10-CM | POA: Insufficient documentation

## 2022-01-07 DIAGNOSIS — I129 Hypertensive chronic kidney disease with stage 1 through stage 4 chronic kidney disease, or unspecified chronic kidney disease: Secondary | ICD-10-CM | POA: Insufficient documentation

## 2022-01-07 DIAGNOSIS — E11622 Type 2 diabetes mellitus with other skin ulcer: Secondary | ICD-10-CM | POA: Insufficient documentation

## 2022-01-07 DIAGNOSIS — E1122 Type 2 diabetes mellitus with diabetic chronic kidney disease: Secondary | ICD-10-CM | POA: Insufficient documentation

## 2022-01-07 DIAGNOSIS — L97122 Non-pressure chronic ulcer of left thigh with fat layer exposed: Secondary | ICD-10-CM | POA: Diagnosis not present

## 2022-01-10 DIAGNOSIS — E119 Type 2 diabetes mellitus without complications: Secondary | ICD-10-CM | POA: Diagnosis not present

## 2022-01-10 DIAGNOSIS — Z4801 Encounter for change or removal of surgical wound dressing: Secondary | ICD-10-CM | POA: Diagnosis not present

## 2022-01-10 DIAGNOSIS — S71101A Unspecified open wound, right thigh, initial encounter: Secondary | ICD-10-CM | POA: Diagnosis not present

## 2022-01-10 DIAGNOSIS — E1165 Type 2 diabetes mellitus with hyperglycemia: Secondary | ICD-10-CM | POA: Diagnosis not present

## 2022-01-12 ENCOUNTER — Ambulatory Visit (INDEPENDENT_AMBULATORY_CARE_PROVIDER_SITE_OTHER)
Admission: RE | Admit: 2022-01-12 | Discharge: 2022-01-12 | Disposition: A | Discharge status: Home / Self Care | Payer: Medicare Other | Source: Ambulatory Visit | Attending: Surgery | Admitting: Surgery

## 2022-01-12 ENCOUNTER — Ambulatory Visit (INDEPENDENT_AMBULATORY_CARE_PROVIDER_SITE_OTHER): Payer: Medicare Other | Admitting: Physician Assistant

## 2022-01-12 ENCOUNTER — Ambulatory Visit (HOSPITAL_COMMUNITY)
Admission: RE | Admit: 2022-01-12 | Discharge: 2022-01-12 | Disposition: A | Discharge status: Home / Self Care | Payer: Medicare Other | Source: Ambulatory Visit | Attending: Surgery | Admitting: Surgery

## 2022-01-12 VITALS — BP 106/72 | HR 89 | Temp 97.4°F | Resp 16 | Ht 72.0 in | Wt 208.0 lb

## 2022-01-12 DIAGNOSIS — I7025 Atherosclerosis of native arteries of other extremities with ulceration: Secondary | ICD-10-CM | POA: Diagnosis not present

## 2022-01-12 DIAGNOSIS — I739 Peripheral vascular disease, unspecified: Secondary | ICD-10-CM | POA: Insufficient documentation

## 2022-01-12 NOTE — Progress Notes (Signed)
Michael [name], Michael [name] ([id number]) [id number] Page 1 of 9 Visit Report for 01/07/2022 Arrival Information Details Patient Name: Date of Service: Michael [place], Michael C. 01/07/2022 1:00 PM Medical Record Number: [id number] Patient Account Number: [id number] Date of Birth/Sex: Treating RN: January 01, 1951 (71 y.o. [name] Primary Care Lucius Wise: [name] Other Clinician: Referring Gerasimos Plotts: Treating Erial Fikes/Extender: [name] in Treatment: 4 Visit Information History Since Last Visit Added or deleted any medications: No Patient Arrived: Ambulatory Any new allergies or adverse reactions: No Arrival Time: 12:50 Had a fall or experienced change in No Accompanied By: wife activities of daily living that may affect Transfer Assistance: None risk of falls: Patient Identification Verified: Yes Signs or symptoms of abuse/neglect since last visito No Secondary Verification Process Completed: Yes Hospitalized since last visit: No Patient Requires Transmission-Based Precautions: No Implantable device outside of the clinic excluding No Patient Has Alerts: Yes cellular tissue based products placed in the center Patient Alerts: Patient on Blood Thinner since last visit: plavix Has Compression in Place as Prescribed: Yes Pain Present Now: Yes Electronic Signature(s) Signed: 01/12/2022 5:04:49 PM By: [name] Entered By: Blanche East on 01/07/2022 12:54:33 -------------------------------------------------------------------------------- Encounter Discharge Information Details Patient Name: Date of Service: Michael [name], Michael C. 01/07/2022 1:00 PM Medical Record Number: [id number] Patient Account Number: [id number] Date of Birth/Sex: Treating RN: Nov 07, 1950 (71 y.o. [name] Primary Care Seyon Strader: [name] Other Clinician: Referring Hafsa Lohn: Treating Liahm Grivas/Extender: [name] in  Treatment: 4 Encounter Discharge Information Items Post Procedure Vitals Discharge Condition: Stable Temperature (F): 97.8 Ambulatory Status: Ambulatory Pulse (bpm): 109 Discharge Destination: Home Respiratory Rate (breaths/min): 16 Transportation: Private Auto Blood Pressure (mmHg): 91/61 Accompanied By: wife Schedule Follow-up Appointment: Yes Clinical Summary of Care: Electronic Signature(s) Signed: 01/12/2022 5:04:49 PM By: [name] Entered By: Blanche East on 01/07/2022 13:31:34 [name] ([id number]) [id number] Page 2 of 9 -------------------------------------------------------------------------------- Lower Extremity Assessment Details Patient Name: Date of Service: Michael [name], Michael C. 01/07/2022 1:00 PM Medical Record Number: [id number] Patient Account Number: [id number] Date of Birth/Sex: Treating RN: 08/27/1950 (71 y.o. [name] Primary Care Ambria Mayfield: [name] Other Clinician: Referring Jeannett Dekoning: Treating Mayah Urquidi/Extender: [name] in Treatment: 4 Edema Assessment Assessed: Shirlyn Goltz: No] [Right: No] Edema: [Left: Ye] [Right: s] Calf Left: Right: Point of Measurement: 35 cm From Medial Instep 37.5 cm Ankle Left: Right: Point of Measurement: 12 cm From Medial Instep 24 cm Vascular Assessment Pulses: Dorsalis Pedis Palpable: [Right:Yes] Electronic Signature(s) Signed: 01/12/2022 5:04:49 PM By: [name] Entered By: Blanche East on 01/07/2022 13:00:13 -------------------------------------------------------------------------------- Multi Wound Chart Details Patient Name: Date of Service: Michael [name], Michael C. 01/07/2022 1:00 PM Medical Record Number: [id number] Patient Account Number: [id number] Date of Birth/Sex: Treating RN: 1950/04/12 (71 y.o. M) Primary Care Tammara Massing: [name] Other Clinician: Referring Latrell Reitan: Treating Emiliana Blaize/Extender: [name] in Treatment: 4 Vital Signs Height(in): 72 Capillary Blood Glucose(mg/dl): 131 Weight(lbs): 208 Pulse(bpm): 109 Body Mass Index(BMI): 28.2 Blood Pressure(mmHg): 91/61 Temperature(F): 97.8 Respiratory Rate(breaths/min): 16 [3:Photos:] [4:No Photos] [[name]/A:[name]/A [id number] Page 3 of 9] Right, Anterior Lower Leg Right, Medial Upper Leg [name]/A Wound Location: Blister Gradually Appeared [name]/A Wounding Event: Diabetic Wound/Ulcer of the Lower Dehisced Wound [name]/A Primary Etiology: Extremity Chronic sinus problems/congestion, [name]/A [name]/A Comorbid History: Hypertension, Peripheral Arterial Disease, Peripheral Venous Disease, Type II Diabetes 09/30/2021 01/05/2022 [name]/A Date Acquired: 4 0 [name]/A Weeks of Treatment: Open Converted [name]/A Wound Status:  No No [name]/A Wound Recurrence: 5x3.5x0.2 [name]/A [name]/A Measurements L x W x D (cm) 13.744 [name]/A [name]/A A (cm) : rea 2.749 [name]/A [name]/A Volume (cm) : 21.70% [name]/A [name]/A % Reduction in A rea: 47.80% [name]/A [name]/A % Reduction in Volume: Grade 2 [name]/A [name]/A Classification: Medium [name]/A [name]/A Exudate A mount: Serosanguineous [name]/A [name]/A Exudate Type: red, brown [name]/A [name]/A Exudate Color: Distinct, outline attached [name]/A [name]/A Wound Margin: Small (1-33%) [name]/A [name]/A Granulation A mount: Red, Pink [name]/A [name]/A Granulation Quality: Large (67-100%) [name]/A [name]/A Necrotic A mount: Fat Layer (Subcutaneous Tissue): Yes [name]/A [name]/A Exposed Structures: Fascia: No Tendon: No Muscle: No Joint: No Bone: No Small (1-33%) [name]/A [name]/A Epithelialization: Debridement - Excisional [name]/A [name]/A Debridement: Pre-procedure Verification/Time Out 13:25 [name]/A [name]/A Taken: Lidocaine 5% topical ointment [name]/A [name]/A Pain Control: Subcutaneous, Slough [name]/A [name]/A Tissue Debrided: Skin/Subcutaneous Tissue [name]/A [name]/A Level: 17.5 [name]/A [name]/A Debridement A (sq cm): rea Curette [name]/A [name]/A Instrument: Moderate [name]/A [name]/A Bleeding: Silver Nitrate [name]/A [name]/A Hemostasis A chieved: 0 [name]/A [name]/A Procedural  Pain: 0 [name]/A [name]/A Post Procedural Pain: Procedure was tolerated well [name]/A [name]/A Debridement Treatment Response: 5x3.5x0.1 [name]/A [name]/A Post Debridement Measurements L x W x D (cm) 1.374 [name]/A [name]/A Post Debridement Volume: (cm) No Abnormalities Noted No Abnormalities Noted [name]/A Periwound Skin Texture: No Abnormalities Noted No Abnormalities Noted [name]/A Periwound Skin Moisture: Atrophie Blanche: No No Abnormalities Noted [name]/A Periwound Skin Color: Cyanosis: No Erythema: No No Abnormality [name]/A [name]/A Temperature: Yes [name]/A [name]/A Tenderness on Palpation: Debridement [name]/A [name]/A Procedures Performed: Treatment Notes Wound #3 (Lower Leg) Wound Laterality: Right, Anterior Cleanser Peri-Wound Care Sween Lotion (Moisturizing lotion) Discharge Instruction: Apply moisturizing lotion as directed Topical Gentamicin Discharge Instruction: As directed by physician Primary Dressing Santyl Ointment Discharge Instruction: Apply nickel thick amount to wound bed as instructed Secondary Dressing ABD Pad, 8x10 Discharge Instruction: Apply over primary dressing as directed. Woven Gauze Sponge, Non-Sterile 4x4 in Discharge Instruction: Apply over primary dressing as directed. SEGUNDO, MAKELA ([id number]) [id number] Page 4 of 9 Secured With [employer] 4x5 (in/yd) Discharge Instruction: Secure with Coban as directed. Stretch Net Size 5, 10 (yds) Compression Wrap Compression Stockings Add-Ons Wound #4 (Upper Leg) Wound Laterality: Right, Medial Cleanser Peri-Wound Care Topical Primary Dressing Secondary Dressing Secured With Compression Wrap Compression Stockings Add-Ons Electronic Signature(s) Signed: 01/07/2022 1:54:19 PM By: [name] Entered By: Fredirick Maudlin on 01/07/2022 13:54:19 -------------------------------------------------------------------------------- Multi-Disciplinary Care Plan Details Patient Name: Date of Service: Michael [name], Michael C. 01/07/2022 1:00 PM Medical Record Number: [id number] Patient Account Number: [id number] Date of Birth/Sex: Treating RN: 1950-10-05 (71 y.o. [name] Primary Care Orena Cavazos: [name] Other Clinician: Referring Artemus Romanoff: Treating Romaine Neville/Extender: [name] in Treatment: 4 Active Inactive Nutrition Nursing Diagnoses: Potential for alteratiion in Nutrition/Potential for imbalanced nutrition Goals: Patient/caregiver agrees to and verbalizes understanding of need to use nutritional supplements and/or vitamins as prescribed Date Initiated: 12/04/2021 Target Resolution Date: 01/28/2022 Goal Status: Active Patient/caregiver verbalizes understanding of need to maintain therapeutic glucose control per primary care physician Date Initiated: 12/04/2021 Target Resolution Date: 01/28/2022 Goal Status: Active Interventions: Assess patient nutrition upon admission and as needed per policy Provide education on elevated blood sugars and impact on wound healing Provide education on nutrition Treatment Activities: Education provided on Nutrition : 12/30/2021 Notes: TYLAN, KINN ([id number]) [id number] Page 5 of 9 Wound/Skin Impairment Nursing Diagnoses: Impaired tissue integrity Knowledge deficit related to ulceration/compromised skin integrity Goals: Patient/caregiver will verbalize understanding of skin care regimen Date  Initiated: 12/04/2021 Target Resolution Date: 01/28/2022 Goal Status: Active Ulcer/skin breakdown will have a volume reduction of 30% by week 4 Date Initiated: 12/04/2021 Date Inactivated: 01/07/2022 Target Resolution Date: 12/26/2021 Goal Status: Met Ulcer/skin breakdown will have a volume reduction of 50% by week 8 Date Initiated: 01/07/2022 Target Resolution Date: 01/28/2022 Goal Status: Active Interventions: Assess ulceration(s) every visit Provide education on ulcer and skin  care Notes: Electronic Signature(s) Signed: 01/12/2022 5:04:49 PM By: [name] Entered By: Blanche East on 01/07/2022 13:12:04 -------------------------------------------------------------------------------- Pain Assessment Details Patient Name: Date of Service: Michael [name], Michael [place] C. 01/07/2022 1:00 PM Medical Record Number: [id number] Patient Account Number: [id number] Date of Birth/Sex: Treating RN: 03-03-1950 (71 y.o. [name] Primary Care Talin Rozeboom: [name] Other Clinician: Referring Gurtej Noyola: Treating Latravia Southgate/Extender: [name] in Treatment: 4 Active Problems Location of Pain Severity and Description of Pain Patient Has Paino Yes Site Locations Rate the pain. Current Pain Level: 6 Character of Pain Describe the Pain: Aching Pain Management and Medication Current Pain Management: KITT, LEDET ([id number]) [id number] Page 6 of 9 Electronic Signature(s) Signed: 01/12/2022 5:04:49 PM By: [name] Entered By: Blanche East on 01/07/2022 12:56:08 -------------------------------------------------------------------------------- Patient/Caregiver Education Details Patient Name: Date of Service: Michael [name], Michael [place] C. 11/8/2023andnbsp1:00 PM Medical Record Number: [id number] Patient Account Number: [id number] Date of Birth/Gender: Treating RN: [birth year] (71 y.o. [name] Primary Care Physician: [name] Other Clinician: Referring Physician: Treating Physician/Extender: [name] in Treatment: 4 Education Assessment Education Provided To: Patient Education Topics Provided Elevated Blood Sugar/ Impact on Healing: Methods: Explain/Verbal Responses: Reinforcements needed, State content correctly Nutrition: Methods: Explain/Verbal Responses: Reinforcements needed, State content correctly Wound/Skin Impairment: Methods: Explain/Verbal Responses:  Reinforcements needed, State content correctly Electronic Signature(s) Signed: 01/12/2022 5:04:49 PM By: [name] Entered By: Blanche East on 01/07/2022 13:12:33 -------------------------------------------------------------------------------- Wound Assessment Details Patient Name: Date of Service: Michael [name], Michael [place] C. 01/07/2022 1:00 PM Medical Record Number: [id number] Patient Account Number: [id number] Date of Birth/Sex: Treating RN: 1950-11-02 (71 y.o. [name] Primary Care Daaron Dimarco: [name] Other Clinician: Referring Malarie Tappen: Treating Courtlynn Holloman/Extender: [name] in Treatment: 4 Wound Status Wound Number: 3 Primary Diabetic Wound/Ulcer of the Lower Extremity Etiology: Wound Location: Right, Anterior Lower Leg Wound Open Wounding Event: Blister Status: Date Acquired: 09/30/2021 Comorbid Chronic sinus problems/congestion, Hypertension, Peripheral Weeks Of Treatment: 4 History: Arterial Disease, Peripheral Venous Disease, Type II Diabetes Clustered Wound: No Photos Michael [name], Michael [name] ([id number]) [id number] Page 7 of 9 Wound Measurements Length: (cm) 5 Width: (cm) 3.5 Depth: (cm) 0.2 Area: (cm) 13.744 Volume: (cm) 2.749 % Reduction in Area: 21.7% % Reduction in Volume: 47.8% Epithelialization: Small (1-33%) Tunneling: No Undermining: No Wound Description Classification: Grade 2 Wound Margin: Distinct, outline attached Exudate Amount: Medium Exudate Type: Serosanguineous Exudate Color: red, brown Foul Odor After Cleansing: No Slough/Fibrino Yes Wound Bed Granulation Amount: Small (1-33%) Exposed Structure Granulation Quality: Red, Pink Fascia Exposed: No Necrotic Amount: Large (67-100%) Fat Layer (Subcutaneous Tissue) Exposed: Yes Necrotic Quality: Adherent Slough Tendon Exposed: No Muscle Exposed: No Joint Exposed: No Bone Exposed: No Periwound Skin Texture Texture Color No  Abnormalities Noted: Yes No Abnormalities Noted: Yes Moisture Temperature / Pain No Abnormalities Noted: Yes Temperature: No Abnormality Tenderness on Palpation: Yes Treatment Notes Wound #3 (Lower Leg) Wound Laterality: Right, Anterior Cleanser Peri-Wound Care Sween Lotion (Moisturizing lotion) Discharge Instruction: Apply moisturizing lotion as directed Topical Gentamicin Discharge Instruction: As directed by physician  Primary Dressing Santyl Ointment Discharge Instruction: Apply nickel thick amount to wound bed as instructed Secondary Dressing ABD Pad, 8x10 Discharge Instruction: Apply over primary dressing as directed. Woven Gauze Sponge, Non-Sterile 4x4 in Discharge Instruction: Apply over primary dressing as directed. Secured With [employer] 4x5 (in/yd) Discharge Instruction: Secure with Coban as directed. Stretch Net Size 5, 10 (yds) Compression Wrap Michael [name], Michael [name] ([id number]) [id number] Page 8 of 9 Compression Stockings Add-Ons Electronic Signature(s) Signed: 01/12/2022 5:04:49 PM By: [name] Entered By: Blanche East on 01/07/2022 13:08:29 -------------------------------------------------------------------------------- Wound Assessment Details Patient Name: Date of Service: Michael [name], [place] C. 01/07/2022 1:00 PM Medical Record Number: [id number] Patient Account Number: [id number] Date of Birth/Sex: Treating RN: Jul 20, 1950 (71 y.o. [name] Primary Care Phoua Hoadley: [name] Other Clinician: Referring Kayton Ripp: Treating Chimaobi Casebolt/Extender: [name] in Treatment: 4 Wound Status Wound Number: 4 Primary Etiology: Dehisced Wound Wound Location: Right, Medial Upper Leg Wound Status: Converted Wounding Event: Gradually Appeared Date Acquired: 01/05/2022 Weeks Of Treatment: 0 Clustered Wound: No Periwound Skin Texture Texture Color No Abnormalities Noted: No No  Abnormalities Noted: No Moisture No Abnormalities Noted: No Treatment Notes Wound #4 (Upper Leg) Wound Laterality: Right, Medial Cleanser Peri-Wound Care Topical Primary Dressing Secondary Dressing Secured With Compression Wrap Compression Stockings Add-Ons Electronic Signature(s) Signed: 01/12/2022 5:04:49 PM By: [name] Entered By: Blanche East on 01/07/2022 13:48:31 -------------------------------------------------------------------------------- [place] Details Patient Name: Date of Service: Michael [place], Crystal C. 01/07/2022 1:00 PM Medical Record Number: [id number] Patient Account Number: [id number] JAYVIER, BURGHER ([id number]) [id number] Page 9 of 9 Date of Birth/Sex: Treating RN: 04-Feb-1951 (71 y.o. [name] Primary Care Darelle Kings: [name] Other Clinician: Referring Beverlyn Mcginness: Treating Kamarah Bilotta/Extender: [name] in Treatment: 4 Vital Signs Time Taken: 12:54 Temperature (F): 97.8 Height (in): 72 Pulse (bpm): 109 Weight (lbs): 208 Respiratory Rate (breaths/min): 16 Body Mass Index (BMI): 28.2 Blood Pressure (mmHg): 91/61 Capillary Blood Glucose (mg/dl): 131 Reference Range: 80 - 120 mg / dl Electronic Signature(s) Signed: 01/12/2022 5:04:49 PM By: [name] Entered By: Blanche East on 01/07/2022 12:55:09

## 2022-01-12 NOTE — Progress Notes (Signed)
Michael [name], Michael [name] ([id number]) [id number] Page 1 of 9 Visit Report for 01/07/2022 Chief Complaint Document Details Patient Name: Date of Service: Michael [name], Michael C. 01/07/2022 1:00 PM Medical Record Number: [id number] Patient Account Number: [id number] Date of Birth/Sex: Treating RN: October 17, 1950 (71 y.o. M) Primary Care Provider: [name] Other Clinician: Referring Provider: Treating Provider/Extender: [name] in Treatment: 4 Information Obtained from: Patient Chief Complaint Patient presents to the wound care center today with an open arterial ulcer to the right lower extremity in the setting of diabetes mellitus. he has had this problem for 7 months 12/04/2021: ulcer to right lower anterior tibial surface Electronic Signature(s) Signed: 01/07/2022 1:54:27 PM By: [name] Entered By: Fredirick Maudlin on 01/07/2022 13:54:27 -------------------------------------------------------------------------------- Debridement Details Patient Name: Date of Service: Michael [name], Michael C. 01/07/2022 1:00 PM Medical Record Number: [id number] Patient Account Number: [id number] Date of Birth/Sex: Treating RN: 08/31/1950 (71 y.o. [name] Primary Care Provider: [name] Other Clinician: Referring Provider: Treating Provider/Extender: [name] in Treatment: 4 Debridement Performed for Assessment: Wound #3 Right,Anterior Lower Leg Performed By: Physician Fredirick Maudlin, MD Debridement Type: Debridement Severity of Tissue Pre Debridement: Fat layer exposed Level of Consciousness (Pre-procedure): Awake and Alert Pre-procedure Verification/Time Out Yes - 13:25 Taken: Start Time: 13:26 Pain Control: Lidocaine 5% topical ointment T Area Debrided (L x W): otal 5 (cm) x 3.5 (cm) = 17.5 (cm) Tissue and other material debrided: Viable, Non-Viable, Slough, Subcutaneous, Slough Level:  Skin/Subcutaneous Tissue Debridement Description: Excisional Instrument: Curette Bleeding: Moderate Hemostasis Achieved: Silver Nitrate Procedural Pain: 0 Post Procedural Pain: 0 Response to Treatment: Procedure was tolerated well Level of Consciousness (Post- Awake and Alert procedure): Post Debridement Measurements of Total Wound Length: (cm) 5 Width: (cm) 3.5 Depth: (cm) 0.1 Volume: (cm) 1.374 Character of Wound/Ulcer Post Debridement: Requires Further Debridement Michael [name], Michael [name] ([id number]) [id number] Page 2 of 9 Severity of Tissue Post Debridement: Fat layer exposed Post Procedure Diagnosis Same as Pre-procedure Notes Scribed for Dr. Celine Ahr by Blanche East, RN Electronic Signature(s) Signed: 01/07/2022 3:57:12 PM By: [name] Signed: 01/12/2022 5:04:49 PM By: [name] Entered By: Blanche East on 01/07/2022 13:30:40 -------------------------------------------------------------------------------- HPI Details Patient Name: Date of Service: Michael [name], Michael C. 01/07/2022 1:00 PM Medical Record Number: [id number] Patient Account Number: [id number] Date of Birth/Sex: Treating RN: 1951/02/11 (71 y.o. M) Primary Care Provider: [name] Other Clinician: Referring Provider: Treating Provider/Extender: [name] in Treatment: 4 History of Present Illness Location: right lateral calf closer to the knee Quality: Patient reports experiencing a dull pain to affected area(s). Severity: Patient states wound(s) are getting worse. Duration: Patient has had the wound for > 7 months prior to seeking treatment at the wound center Timing: Pain in wound is constant (hurts all the time) Context: The wound would happen gradually Modifying Factors: Patient wound(s)/ulcer(s) are worsening due to : no resolution and a white material at the base of the wound ssociated Signs and Symptoms: Patient reports  having:no discharge or purulent material A HPI Description: 71 year old gentleman who has been referred to was from his PCP for a chronic ulceration on his right lower extremity which she's had for several weeks. Past medical history significant for diabetes mellitus type 2, hypertension, hyperlipidemia, anxiety, obesity, peripheral vascular disease and chronic kidney disease. He has never been a smoker. Most recent lab work done at his PCPs office showed a glucose of 217 milligrams  per deciliter which is consistent with hyperglycemia. In January 2017 recent hemoglobin A1c values noted to be 8.2%. In April 2017, he has been seen at the vascular office by Dr. Trula Slade and Dr. Kellie Simmering for right leg claudication. He had a right superficial femoral artery stent in September 2012. Recent noninvasive vascular imaging done on 06/24/2015 showed a right ABI of 1.07 with a triphasic waveform and a right TBI of 0.81. The left was noncompressible with a triphasic waveform and a TBI of 1.17. Right lower extremity arterial duplex was unable to identify stent exit but they were elevated velocities at the stent and in the distal thigh with triphasic waveforms. Dr. Stephens Shire opinion was to return to the clinic in 6 months with ABI and right lower extremity arterial duplex studies to be done. He has seen a dermatologist who had done a biopsy and said it was benign and he was given a steroid cream. 10/23/2015 -- Pathology report of the biopsy done in April 2017 shows ulcer with underlying angiodermatitis consistent with stasis dermatitis. 10/30/2015 -- he is going for shoulder surgery in about 10 days' time and was asking about the perioperative care. His blood sugars are running in the high 100s or low 200s. No hemoglobin A1c done recently. 11/20/2015 -- is back up to 2 weeks because of recent left shoulder surgery. 12/04/15; patient returns today with the wound for the most part looking healthy. No evidence of infection  no debridement required. He is using Prisma for 4 weeks, without obvious improvement per her intake nurse. This started as several small open areas that were raised erythematous. He saw dermatology and at some point this was biopsied that just suggested stasis skin physiology. This certainly doesn't look like that 12/11/15; using Hydrafera Blue. Previous biopsy reviewed, no atypia PAS negative. He has a history of stents in the right leg however he does not appear to have primary arterial insufficiency ABI in this clinic was over 0.9. 12/25/2015 -- he has been approved for grafix and we will order for some to be applied next week 01/01/2016 -- he has had his first application of Grafix today 01/08/2016 -- he has had his second application of Grafix today READMISSION 12/04/2021 This is a now 71 year old man who was followed in our clinic about 5 years ago for an ulceration on his right lateral lower leg, just distal to the knee. He has since undergone a number of revascularization procedures that have been complicated by restenosis and wound infections. During the course of this treatment, he developed a small ulcer on his right anterior tibial surface. Despite use of an Unna boot, the wound has continued to expand. He was referred to the wound care center by Dr. Trula Slade for further evaluation and management. On the right anterior tibial surface, there is an irregular wound with heavy slough and eschar accumulation. The periwound skin is intact but he does have 1-2+ pitting edema. There is no purulent drainage or malodor. 10/13; second visit for this man who has an ischemic wound in the setting of type 2 diabetes on the right anterior mid tibia area. He has been revascularized. Using Santyl gent 12/22/2021: The wound is about the same size this week. There is a lot of gray sloughy material on the surface, secondary to silver nitrate used to stop bleeding after what sounds like a fairly aggressive  debridement last week. Michael [name], Michael [name] ([id number]) [id number] Page 3 of 9 12/30/2021: The wound is smaller this week and a bit cleaner.  Edema control is excellent. There is still a bit of slough accumulation. 01/07/2022: The wound continues to contract and is much cleaner this week. Edema control is very good. He has small openings in his upper leg surgical scars, but he is going to be seeing Dr. Trula Slade next Monday and will have him take a look. He has been applying manuka honey to both of the sites. We have been using Santyl and gentamicin on his lower leg wound. Electronic Signature(s) Signed: 01/07/2022 1:55:51 PM By: [name] Entered By: Fredirick Maudlin on 01/07/2022 13:55:51 -------------------------------------------------------------------------------- Physical Exam Details Patient Name: Date of Service: Michael [name], Michael C. 01/07/2022 1:00 PM Medical Record Number: [id number] Patient Account Number: [id number] Date of Birth/Sex: Treating RN: 1950/09/11 (71 y.o. M) Primary Care Provider: [name] Other Clinician: Referring Provider: Treating Provider/Extender: [name] in Treatment: 4 Constitutional Hypotensive, normal for this patient, asymptomatic. Slightly tachycardic, asymptomatic. . . No acute distress.Marland Kitchen Respiratory Normal work of breathing on room air.. Notes 01/07/2022: The wound continues to contract and is much cleaner this week. Edema control is very good. He has small openings in his upper leg surgical scars. Electronic Signature(s) Signed: 01/07/2022 1:56:42 PM By: [name] Entered By: Fredirick Maudlin on 01/07/2022 13:56:42 -------------------------------------------------------------------------------- Physician Orders Details Patient Name: Date of Service: Michael [name], Michael C. 01/07/2022 1:00 PM Medical Record Number: [id number] Patient Account Number: [id number] Date  of Birth/Sex: Treating RN: August 15, 1950 (71 y.o. [name] Primary Care Provider: [name] Other Clinician: Referring Provider: Treating Provider/Extender: [name] in Treatment: 4 Verbal / Phone Orders: No Diagnosis Coding ICD-10 Coding Code Description [phone] Non-pressure chronic ulcer of other part of right lower leg with fat layer exposed I70.239 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 E11.622 Type 2 diabetes mellitus with other skin ulcer I73.9 Peripheral vascular disease, unspecified Follow-up Appointments ppointment in 1 week. - Dr. Celine Ahr - room 4 Return A Wednesday 01/14/22 at 1:00 PM Anesthetic Wound #3 Right,Anterior Lower Leg DONTRAE, MORINI ([id number]) [id number] Page 4 of 9 (In clinic) Topical Lidocaine 5% applied to wound bed Bathing/ Shower/ Hygiene May shower with protection but do not get wound dressing(s) wet. - purchase cast protector from [facility], [facility] or [employer] Edema Control - Lymphedema / SCD / Other Right Lower Extremity Elevate legs to the level of the heart or above for 30 minutes daily and/or when sitting, a frequency of: Avoid standing for long periods of time. Wound Treatment Wound #3 - Lower Leg Wound Laterality: Right, Anterior Peri-Wound Care: Sween Lotion (Moisturizing lotion) Discharge Instructions: Apply moisturizing lotion as directed Prim Dressing: Promogran Prisma Matrix, 4.34 (sq in) (silver collagen) ary Discharge Instructions: Moisten collagen with saline or hydrogel Secondary Dressing: ABD Pad, 8x10 Discharge Instructions: Apply over primary dressing as directed. Secondary Dressing: Woven Gauze Sponge, Non-Sterile 4x4 in Discharge Instructions: Apply over primary dressing as directed. Secured With: Coban Self-Adherent Wrap 4x5 (in/yd) Discharge Instructions: Secure with Coban as directed. Secured With: [employer] Size  5, 10 (yds) Electronic Signature(s) Signed: 01/07/2022 3:57:12 PM By: [name] Entered By: Fredirick Maudlin on 01/07/2022 13:56:54 -------------------------------------------------------------------------------- Problem List Details Patient Name: Date of Service: Michael [name], Michael C. 01/07/2022 1:00 PM Medical Record Number: [id number] Patient Account Number: [id number] Date of Birth/Sex: Treating RN: [birth year] (71 y.o. M) Primary Care Provider: [name] Other Clinician: Referring Provider: Treating Provider/Extender: [name] in  Treatment: 4 Active Problems ICD-10 Encounter Code Description Active Date MDM Diagnosis L97.812 Non-pressure chronic ulcer of other part of right lower leg with fat layer 12/04/2021 No Yes exposed I70.239 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 12/04/2021 No Yes E11.622 Type 2 diabetes mellitus with other skin ulcer 12/04/2021 No Yes I73.9 Peripheral vascular disease, unspecified 12/04/2021 No Yes KAYLEB, WARSHAW ([id number]) [id number] Page 5 of 9 Inactive Problems Resolved Problems Electronic Signature(s) Signed: 01/07/2022 1:54:13 PM By: [name] Entered By: Fredirick Maudlin on 01/07/2022 13:54:12 -------------------------------------------------------------------------------- Progress Note Details Patient Name: Date of Service: Michael [name], Michael C. 01/07/2022 1:00 PM Medical Record Number: [id number] Patient Account Number: [id number] Date of Birth/Sex: Treating RN: 1950/07/12 (71 y.o. M) Primary Care Provider: [name] Other Clinician: Referring Provider: Treating Provider/Extender: [name] in Treatment: 4 Subjective Chief Complaint Information obtained from Patient Patient presents to the wound care center today with an open arterial ulcer to the right lower extremity in  the setting of diabetes mellitus. he has had this problem for 7 months 12/04/2021: ulcer to right lower anterior tibial surface History of Present Illness (HPI) The following HPI elements were documented for the patient's wound: Location: right lateral calf closer to the knee Quality: Patient reports experiencing a dull pain to affected area(s). Severity: Patient states wound(s) are getting worse. Duration: Patient has had the wound for > 7 months prior to seeking treatment at the wound center Timing: Pain in wound is constant (hurts all the time) Context: The wound would happen gradually Modifying Factors: Patient wound(s)/ulcer(s) are worsening due to : no resolution and a white material at the base of the wound Associated Signs and Symptoms: Patient reports having:no discharge or purulent material 71 year old gentleman who has been referred to was from his PCP for a chronic ulceration on his right lower extremity which she's had for several weeks. Past medical history significant for diabetes mellitus type 2, hypertension, hyperlipidemia, anxiety, obesity, peripheral vascular disease and chronic kidney disease. He has never been a smoker. Most recent lab work done at his PCPs office showed a glucose of 217 milligrams per deciliter which is consistent with hyperglycemia. In January 2017 recent hemoglobin A1c values noted to be 8.2%. In April 2017, he has been seen at the vascular office by Dr. Trula Slade and Dr. Kellie Simmering for right leg claudication. He had a right superficial femoral artery stent in September 2012. Recent noninvasive vascular imaging done on 06/24/2015 showed a right ABI of 1.07 with a triphasic waveform and a right TBI of 0.81. The left was noncompressible with a triphasic waveform and a TBI of 1.17. Right lower extremity arterial duplex was unable to identify stent exit but they were elevated velocities at the stent and in the distal thigh with triphasic waveforms. Dr. Stephens Shire  opinion was to return to the clinic in 6 months with ABI and right lower extremity arterial duplex studies to be done. He has seen a dermatologist who had done a biopsy and said it was benign and he was given a steroid cream. 10/23/2015 -- Pathology report of the biopsy done in April 2017 shows ulcer with underlying angiodermatitis consistent with stasis dermatitis. 10/30/2015 -- he is going for shoulder surgery in about 10 days' time and was asking about the perioperative care. His blood sugars are running in the high 100s or low 200s. No hemoglobin A1c done recently. 11/20/2015 -- is back up to 2 weeks because of recent  left shoulder surgery. 12/04/15; patient returns today with the wound for the most part looking healthy. No evidence of infection no debridement required. He is using Prisma for 4 weeks, without obvious improvement per her intake nurse. This started as several small open areas that were raised erythematous. He saw dermatology and at some point this was biopsied that just suggested stasis skin physiology. This certainly doesn't look like that 12/11/15; using Hydrafera Blue. Previous biopsy reviewed, no atypia PAS negative. He has a history of stents in the right leg however he does not appear to have primary arterial insufficiency ABI in this clinic was over 0.9. 12/25/2015 -- he has been approved for grafix and we will order for some to be applied next week 01/01/2016 -- he has had his first application of Grafix today 01/08/2016 -- he has had his second application of Grafix today READMISSION 12/04/2021 This is a now 71 year old man who was followed in our clinic about 5 years ago for an ulceration on his right lateral lower leg, just distal to the knee. He has since undergone a number of revascularization procedures that have been complicated by restenosis and wound infections. During the course of this treatment, he developed a small ulcer on his right anterior tibial surface.  Despite use of an Unna boot, the wound has continued to expand. He was referred to the wound care center by Dr. Trula Slade for further evaluation and management. On the right anterior tibial surface, there is an irregular wound with heavy slough and eschar accumulation. The periwound skin is intact but he does have 1-2+ pitting edema. There is no purulent drainage or malodor. 10/13; second visit for this man who has an ischemic wound in the setting of type 2 diabetes on the right anterior mid tibia area. He has been revascularized. Using Santyl gent 12/22/2021: The wound is about the same size this week. There is a lot of gray sloughy material on the surface, secondary to silver nitrate used to stop Michael [name], Michael [name] ([id number]) [id number] Page 6 of 9 bleeding after what sounds like a fairly aggressive debridement last week. 12/30/2021: The wound is smaller this week and a bit cleaner. Edema control is excellent. There is still a bit of slough accumulation. 01/07/2022: The wound continues to contract and is much cleaner this week. Edema control is very good. He has small openings in his upper leg surgical scars, but he is going to be seeing Dr. Trula Slade next Monday and will have him take a look. He has been applying manuka honey to both of the sites. We have been using Santyl and gentamicin on his lower leg wound. Patient History Information obtained from Patient. Family History Cancer - Siblings, Heart Disease - Father, Hypertension - Father,Mother, No family history of Hereditary Spherocytosis, Kidney Disease, Seizures, Stroke, Thyroid Problems, Tuberculosis. Social History Former smoker - smokeless tobacco, Marital Status - Married, Alcohol Use - Rarely - WINE, Drug Use - No History, Caffeine Use - Daily - COFFEE. Medical History Ear/Nose/Mouth/Throat Patient has history of Chronic sinus problems/congestion - seasonal allergies Cardiovascular Patient has history of  Hypertension, Peripheral Arterial Disease - STENTS, Peripheral Venous Disease Endocrine Patient has history of Type II Diabetes - last A1c- 8.2 Hospitalization/Surgery History - left shoulder surgery. Medical A Surgical History Notes nd Constitutional Symptoms (General Health) obesity , h/o (R) leg claudication (right fem stent September 2012) Cardiovascular hyperlipidemia Endocrine pt. on diet intentionally losing 20 pounds since December 2016 in an effort to improve A1C levels Integumentary (Skin)  psoriatic arthritis Musculoskeletal bilat knee pain identified as an ortho issue psoriatic arthritis Oncologic Prostate cancer 2018 Psychiatric anxiety Objective Constitutional Hypotensive, normal for this patient, asymptomatic. Slightly tachycardic, asymptomatic. No acute distress.. Vitals Time Taken: 12:54 PM, Height: 72 in, Weight: 208 lbs, BMI: 28.2, Temperature: 97.8 F, Pulse: 109 bpm, Respiratory Rate: 16 breaths/min, Blood Pressure: 91/61 mmHg, Capillary Blood Glucose: 131 mg/dl. Respiratory Normal work of breathing on room air.. General Notes: 01/07/2022: The wound continues to contract and is much cleaner this week. Edema control is very good. He has small openings in his upper leg surgical scars. Integumentary (Hair, Skin) Wound #3 status is Open. Original cause of wound was Blister. The date acquired was: 09/30/2021. The wound has been in treatment 4 weeks. The wound is located on the Right,Anterior Lower Leg. The wound measures 5cm length x 3.5cm width x 0.2cm depth; 13.744cm^2 area and 2.749cm^3 volume. There is Fat Layer (Subcutaneous Tissue) exposed. There is no tunneling or undermining noted. There is a medium amount of serosanguineous drainage noted. The wound margin is distinct with the outline attached to the wound base. There is small (1-33%) red, pink granulation within the wound bed. There is a large (67-100%) amount of necrotic tissue within the wound bed including  Adherent Slough. The periwound skin appearance had no abnormalities noted for texture. The periwound skin appearance had no abnormalities noted for moisture. The periwound skin appearance had no abnormalities noted for color. Periwound temperature was noted as No Abnormality. The periwound has tenderness on palpation. Wound #4 status is Converted. Original cause of wound was Gradually Appeared. The date acquired was: 01/05/2022. The wound is located on the Right,Medial Upper Leg. Assessment Active Problems ESTANISLADO, SURGEON ([id number]) [id number] Page 7 of 9 ICD-10 Non-pressure chronic ulcer of other part of right lower leg with fat layer exposed 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 Type 2 diabetes mellitus with other skin ulcer Peripheral vascular disease, unspecified Procedures Wound #3 Pre-procedure diagnosis of Wound #3 is a Diabetic Wound/Ulcer of the Lower Extremity located on the Right,Anterior Lower Leg .Severity of Tissue Pre Debridement is: Fat layer exposed. There was a Excisional Skin/Subcutaneous Tissue Debridement with a total area of 17.5 sq cm performed by Fredirick Maudlin, MD. With the following instrument(s): Curette to remove Viable and Non-Viable tissue/material. Material removed includes Subcutaneous Tissue and Slough and after achieving pain control using Lidocaine 5% topical ointment. No specimens were taken. A time out was conducted at 13:25, prior to the start of the procedure. A Moderate amount of bleeding was controlled with Silver Nitrate. The procedure was tolerated well with a pain level of 0 throughout and a pain level of 0 following the procedure. Post Debridement Measurements: 5cm length x 3.5cm width x 0.1cm depth; 1.374cm^3 volume. Character of Wound/Ulcer Post Debridement requires further debridement. Severity of Tissue Post Debridement is: Fat layer exposed. Post procedure Diagnosis  Wound #3: Same as Pre-Procedure General Notes: Scribed for Dr. Celine Ahr by Blanche East, RN. Plan Follow-up Appointments: Return Appointment in 1 week. - Dr. Celine Ahr - room 4 Wednesday 01/14/22 at 1:00 PM Anesthetic: Wound #3 Right,Anterior Lower Leg: (In clinic) Topical Lidocaine 5% applied to wound bed Bathing/ Shower/ Hygiene: May shower with protection but do not get wound dressing(s) wet. - purchase cast protector from [facility], [facility] or [employer] Edema Control - Lymphedema / SCD / Other: Elevate legs to the level of the heart or above for 30 minutes daily and/or when sitting, a frequency of:  Avoid standing for long periods of time. WOUND #3: - Lower Leg Wound Laterality: Right, Anterior Peri-Wound Care: Sween Lotion (Moisturizing lotion) Discharge Instructions: Apply moisturizing lotion as directed Prim Dressing: Promogran Prisma Matrix, 4.34 (sq in) (silver collagen) ary Discharge Instructions: Moisten collagen with saline or hydrogel Secondary Dressing: ABD Pad, 8x10 Discharge Instructions: Apply over primary dressing as directed. Secondary Dressing: Woven Gauze Sponge, Non-Sterile 4x4 in Discharge Instructions: Apply over primary dressing as directed. Secured With: Coban Self-Adherent Wrap 4x5 (in/yd) Discharge Instructions: Secure with Coban as directed. Secured With: Stretch Net Size 5, 10 (yds) 01/07/2022: The wound continues to contract and is much cleaner this week. Edema control is very good. He has small openings in his upper leg surgical scars. Both of the openings in his surgical scars are clean. I told him to continue to apply the manuka honey until he could see Dr. Trula Slade. I told him I would be willing to look after these wounds as well, if desired. I used a curette to debride slough and nonviable subcutaneous tissue from the lower leg wound. At this point, it is clean enough that I think we can discontinue both the Santyl and gentamicin. We will use Prisma silver collagen  with hydrogel and 3 layer compression. Follow-up in 1 week. Electronic Signature(s) Signed: 01/07/2022 1:57:53 PM By: [name] Entered By: Fredirick Maudlin on 01/07/2022 13:57:53 -------------------------------------------------------------------------------- HxROS Details Patient Name: Date of Service: Michael [name], Michael C. 01/07/2022 1:00 PM Medical Record Number: [id number] Patient Account Number: [id number] Date of Birth/Sex: Treating RN: May 22, 1950 (71 y.o. M) Primary Care Provider: [name] Other Clinician: ENZO, TREU ([id number]) [id number] Page 8 of 9 Referring Provider: Treating Provider/Extender: [name] in Treatment: 4 Information Obtained From Patient Constitutional Symptoms (General Health) Medical History: Past Medical History Notes: obesity , h/o (R) leg claudication (right fem stent September 2012) Ear/Nose/Mouth/Throat Medical History: Positive for: Chronic sinus problems/congestion - seasonal allergies Cardiovascular Medical History: Positive for: Hypertension; Peripheral Arterial Disease - STENTS; Peripheral Venous Disease Past Medical History Notes: hyperlipidemia Endocrine Medical History: Positive for: Type II Diabetes - last A1c- 8.2 Past Medical History Notes: pt. on diet intentionally losing 20 pounds since December 2016 in an effort to improve A1C levels Time with diabetes: 9 YRS Treated with: Insulin Blood sugar tested every day: Yes Tested : 7-8 TIMES Integumentary (Skin) Medical History: Past Medical History Notes: psoriatic arthritis Musculoskeletal Medical History: Past Medical History Notes: bilat knee pain identified as an ortho issue psoriatic arthritis Oncologic Medical History: Past Medical History Notes: Prostate cancer 2018 Psychiatric Medical History: Past Medical History Notes: anxiety HBO Extended History  Items Ear/Nose/Mouth/Throat: Chronic sinus problems/congestion Immunizations Pneumococcal Vaccine: Received Pneumococcal Vaccination: Yes Received Pneumococcal Vaccination On or After 60th Birthday: Yes Implantable Devices None Hospitalization / Surgery History Type of Hospitalization/Surgery left shoulder surgery Family and Social History GENNARO, LIZOTTE ([id number]) [id number] Page 9 of 9 Cancer: Yes - Siblings; Heart Disease: Yes - Father; Hereditary Spherocytosis: No; Hypertension: Yes - Father,Mother; Kidney Disease: No; Seizures: No; Stroke: No; Thyroid Problems: No; Tuberculosis: No; Former smoker - smokeless tobacco; Marital Status - Married; Alcohol Use: Rarely - WINE; Drug Use: No History; Caffeine Use: Daily - COFFEE; Financial Concerns: No; Food, Clothing or Shelter Needs: No; Support System Lacking: No; Transportation Concerns: No Electronic Signature(s) Signed: 01/07/2022 3:57:12 PM By: [name] Entered By: Fredirick Maudlin on 01/07/2022 13:56:07 -------------------------------------------------------------------------------- SuperBill Details Patient Name: Date of Service: Michael FFMA N, [name]  01/07/2022 Medical Record Number: [id number] Patient Account Number: [id number] Date of Birth/Sex: Treating RN: Feb 16, 1951 (71 y.o. M) Primary Care Provider: [name] Other Clinician: Referring Provider: Treating Provider/Extender: [name] in Treatment: 4 Diagnosis Coding ICD-10 Codes Code Description [phone] Non-pressure chronic ulcer of other part of right lower leg with fat layer exposed I70.239 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 E11.622 Type 2 diabetes mellitus with other skin ulcer I73.9 Peripheral vascular disease, unspecified Facility Procedures : CPT4 Code: [id number] 1 Description: 3559 - DEB SUBQ TISSUE 20 SQ CM/< ICD-10 Diagnosis  Description L97.812 Non-pressure chronic ulcer of other part of right lower leg with fat layer expo Modifier: sed Quantity: 1 Physician Procedures : CPT4 Code Description Modifier [id number] - WC PHYS LEVEL 3 - EST PT 25 ICD-10 Diagnosis Description L97.812 Non-pressure chronic ulcer of other part of right lower leg with fat layer exposed I70.239 Atherosclerosis of native arteries of right leg  with ulceration of unspecified site E11.622 Type 2 diabetes mellitus with other skin ulcer I73.9 Peripheral vascular disease, unspecified Quantity: 1 : [id number] 11042 - WC PHYS SUBQ TISS 20 SQ CM ICD-10 Diagnosis Description H21.224 Non-pressure chronic ulcer of other part of right lower leg with fat layer exposed Quantity: 1 Electronic Signature(s) Signed: 01/07/2022 1:58:42 PM By: [name] Entered By: Fredirick Maudlin on 01/07/2022 13:58:41

## 2022-01-13 ENCOUNTER — Encounter: Payer: Self-pay | Admitting: Physician Assistant

## 2022-01-13 NOTE — Progress Notes (Signed)
POST OPERATIVE OFFICE NOTE    CC:  F/u for surgery  HPI:  Michael [name] is a 71 y.o. (02/28/1951) male who presents for follow-up wound check on his right leg.  He is s/p right femoral to above-knee popliteal bypass graft with saphenous vein on 04/16/2021 by Dr. Trula Slade.  Following surgery he developed a wound infection at the vein harvest site was taken back to the operating room on 05/02/2021 for debridement.  He went back to the operating room again on 05/05/2021 for further debridement and VAC placement.  He then required balloon angioplasty of the distal anastomosis of his bypass on 10/15/2021.   The right anterior shin wound is healing in with the assistants of the wound care clinic and una boot dressings.    Previous intervention history: 11/05/2010: Stent, right superficial femoral artery (claudication) 09/01/2016: Atherectomy and drug-coated balloon angioplasty, right superficial femoral artery (in-stent stenosis 03/16/2017: Stent, right superficial femoral artery (in-stent stenosis) 01/30/2021: Drug-coated balloon angioplasty, right superficial femoral artery (in-stent stenosis) 04/16/2021: Right femoral to above-knee popliteal artery bypass graft with saphenous vein (claudication) 05/02/2021: Excision and debridement of vein harvest incisions 05/05/2021: I&D vein harvest incisions 10/15/2021: Drug-coated balloon angioplasty of the distal femoral-popliteal bypass graft with repeat I&D of the right leg wound     Allergies  Allergen Reactions   Demerol Anaphylaxis   Lactose Intolerance (Gi) Anaphylaxis   Oxycodone Anxiety and Other (See Comments)    Suicidal ideation   Penicillins Anaphylaxis and Other (See Comments)    Tolerates cefepime    Actos [Pioglitazone] Nausea Only   Exenatide Nausea Only and Other (See Comments)   Glimepiride Other (See Comments)    Edema in ankles    Nabumetone Nausea Only and Other (See Comments)   Victoza [Liraglutide] Nausea Only    Shaking   Fentanyl  Anxiety    Jittery    Hydrocodone Anxiety    Suicidal ideation    Current Outpatient Medications  Medication Sig Dispense Refill   ACCU-CHEK AVIVA PLUS test strip AS DIRECTED SEVEN TIMES A DAY IN VITRO 90 DAYS  4   acetaminophen (TYLENOL) 500 MG tablet Take 1,000 mg by mouth every 6 (six) hours as needed for moderate pain.     aspirin EC 81 MG tablet Take 1 tablet (81 mg total) by mouth daily.     atorvastatin (LIPITOR) 40 MG tablet Take 40 mg by mouth at bedtime.  5   benazepril (LOTENSIN) 40 MG tablet Take 40 mg by mouth daily.     cefadroxil (DURICEF) 500 MG capsule Take 2 capsules (1,000 mg total) by mouth 2 (two) times daily. 56 capsule 0   clonazePAM (KLONOPIN) 1 MG tablet Take 2 mg by mouth at bedtime as needed (sleep).     clopidogrel (PLAVIX) 75 MG tablet TAKE 1 TABLET BY MOUTH EVERY DAY WITH BREAKFAST 90 tablet 3   Continuous Blood Gluc Receiver (FREESTYLE LIBRE 14 DAY READER) DEVI use to monitor blood sugar     dapagliflozin propanediol (FARXIGA) 5 MG TABS tablet Take 5 mg by mouth daily.     diphenhydrAMINE (BENADRYL) 25 MG tablet Take 25 mg by mouth at bedtime as needed for sleep.     fluticasone (FLONASE) 50 MCG/ACT nasal spray Place 2 sprays into both nostrils at bedtime as needed for allergies (before uses CPAP if needed).     folic acid (FOLVITE) 1 MG tablet Take 1 mg by mouth daily.     HUMALOG KWIKPEN 100 UNIT/ML KwikPen Inject 30 Units  into the skin 2 (two) times daily.     hydrochlorothiazide (HYDRODIURIL) 25 MG tablet Take 1 tablet (25 mg total) by mouth daily. Please make overdue appt with Dr. Irish Lack before anymore refills. 2nd attempt 15 tablet 0   HYDROmorphone (DILAUDID) 4 MG tablet Take 0.5 tablets (2 mg total) by mouth every 4 (four) hours as needed for severe pain. 15 tablet 0   insulin glargine (LANTUS) 100 UNIT/ML injection Inject 20 Units into the skin at bedtime.     metFORMIN (GLUCOPHAGE-XR) 500 MG 24 hr tablet Take 2,000 mg by mouth at bedtime.      solifenacin (VESICARE) 5 MG tablet Take 5 mg by mouth every evening.     sulfamethoxazole-trimethoprim (BACTRIM DS) 800-160 MG tablet Take 1 tablet by mouth 2 (two) times daily. 28 tablet 0   tamsulosin (FLOMAX) 0.4 MG CAPS capsule Take 0.4 mg by mouth at bedtime.  5   traZODone (DESYREL) 50 MG tablet Take 50 mg by mouth at bedtime as needed for sleep.     trolamine salicylate (ASPERCREME) 10 % cream Apply 1 Application topically as needed for muscle pain.     amLODipine (NORVASC) 10 MG tablet Take 10 mg by mouth daily. (Patient not taking: Reported on 12/15/2021)     No current facility-administered medications for this visit.     ROS:  See HPI  Physical Exam:       Incision:  Healed groin and leg incision with superficial wounds Extremities:  Motor and sensation intact toes right LE  Una boot wrap to right LE   Right Graft #1: Femoral-popliteal artery bypass  +------------------+--------+--------+---------+--------+                   PSV cm/sStenosisWaveform Comments  +------------------+--------+--------+---------+--------+  Inflow           60              triphasic          +------------------+--------+--------+---------+--------+  Prox Anastomosis  54              triphasic          +------------------+--------+--------+---------+--------+  Proximal Graft    97              triphasic          +------------------+--------+--------+---------+--------+  Mid Graft         114             triphasic          +------------------+--------+--------+---------+--------+  Distal Graft      118             triphasic          +------------------+--------+--------+---------+--------+  Distal Anastomosis125             triphasic          +------------------+--------+--------+---------+--------+  Outflow          54              triphasic          +------------------+--------+--------+---------+--------+            Summary:   Right: Patent Right femoral-popliteal artery bypass without evidence of  stenosis.   ABI Findings:  +---------+------------------+-----+-----------+--------+  Right   Rt Pressure (mmHg)IndexWaveform   Comment   +---------+------------------+-----+-----------+--------+  Brachial 133                                         +---------+------------------+-----+-----------+--------+  PTA                                       bandages  +---------+------------------+-----+-----------+--------+  DP      116               0.87 multiphasic          +---------+------------------+-----+-----------+--------+  Great Toe92                0.69 Normal               +---------+------------------+-----+-----------+--------+   +---------+------------------+-----+-----------+-------+  Left    Lt Pressure (mmHg)IndexWaveform   Comment  +---------+------------------+-----+-----------+-------+  Brachial 109                                        +---------+------------------+-----+-----------+-------+  PTA     147               1.11 multiphasic         +---------+------------------+-----+-----------+-------+  DP      140               1.05 multiphasic         +---------+------------------+-----+-----------+-------+  Great Toe115               0.86 Normal              +---------+------------------+-----+-----------+-------+   +-------+-----------+-----------+------------+------------+  ABI/TBIToday's ABIToday's TBIPrevious ABIPrevious TBI  +-------+-----------+-----------+------------+------------+  Right 0.87       0.69       0.90        0.63          +-------+-----------+-----------+------------+------------+  Left  1.11       0.86       1.19        0.97          +-------+-----------+-----------+------------+------------+     Arterial wall calcification precludes accurate ankle pressures and ABIs.   Bilateral ABIs and  TBIs appear essentially unchanged compared to prior  study on 10/06/21.    Summary:  Right: Resting right ankle-brachial index indicates mild right lower  extremity arterial disease. The right toe-brachial index is abnormal.   Right TBI is borderline abnormal 0.69<0.70.  Left: Resting left ankle-brachial index is within normal range. The left  toe-brachial index is normal.        Assessment/Plan:  This is a 71 y.o. male who is s/p:Right LE bypass with slow healing right anterior shin wound.    ABI's are stable. With multiphasic wave forms.  Bypass duplex demonstrates Patent Right femoral-popliteal artery bypass without evidence of stenosis.    Continue Wound care at wound center.  They may address the incision superficial wounds as well.   He states the anterior leg wound is healing well.  We can follow this wound healing peripherally.  He will f/u in 3 months for wound check if has problems or concerns he will call our office.   -   Roxy Horseman PA-C [facility] [phone]   Clinic MD:  Trula Slade

## 2022-01-14 ENCOUNTER — Encounter (HOSPITAL_BASED_OUTPATIENT_CLINIC_OR_DEPARTMENT_OTHER): Payer: Medicare Other | Admitting: General Surgery

## 2022-01-14 DIAGNOSIS — L97122 Non-pressure chronic ulcer of left thigh with fat layer exposed: Secondary | ICD-10-CM | POA: Diagnosis not present

## 2022-01-14 DIAGNOSIS — N189 Chronic kidney disease, unspecified: Secondary | ICD-10-CM | POA: Diagnosis not present

## 2022-01-14 DIAGNOSIS — I129 Hypertensive chronic kidney disease with stage 1 through stage 4 chronic kidney disease, or unspecified chronic kidney disease: Secondary | ICD-10-CM | POA: Diagnosis not present

## 2022-01-14 DIAGNOSIS — E11622 Type 2 diabetes mellitus with other skin ulcer: Secondary | ICD-10-CM | POA: Diagnosis not present

## 2022-01-14 DIAGNOSIS — E1122 Type 2 diabetes mellitus with diabetic chronic kidney disease: Secondary | ICD-10-CM | POA: Diagnosis not present

## 2022-01-14 DIAGNOSIS — L97815 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: Secondary | ICD-10-CM | POA: Diagnosis not present

## 2022-01-14 DIAGNOSIS — I70239 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: Secondary | ICD-10-CM | POA: Diagnosis not present

## 2022-01-14 DIAGNOSIS — E1151 Type 2 diabetes mellitus with diabetic peripheral angiopathy without gangrene: Secondary | ICD-10-CM | POA: Diagnosis not present

## 2022-01-15 NOTE — Progress Notes (Signed)
Michael [name], Michael [name] ([id number]) [id number] Page 1 of 10 Visit Report for 01/14/2022 Arrival Information Details Patient Name: Date of Service: Michael Michael [name], Michael C. 01/14/2022 1:00 PM Medical Record Number: [id number] Patient Account Number: [id number] Date of Birth/Sex: Treating RN: January 29, 1951 (71 y.o. Michael [name] Primary Care Fronnie Urton: [name] Other Clinician: Referring Collyn Selk: Treating Billy Turvey/Extender: [name] in Treatment: 5 Visit Information History Since Last Visit Added or deleted any medications: No Patient Arrived: Ambulatory Any new allergies or adverse reactions: No Arrival Time: 12:54 Had a fall or experienced change in No Accompanied By: wife activities of daily living that may affect Transfer Assistance: None risk of falls: Patient Requires Transmission-Based Precautions: No Signs or symptoms of abuse/neglect since last visito No Patient Has Alerts: Yes Hospitalized since last visit: No Patient Alerts: Patient on Blood Thinner Implantable device outside of the clinic excluding No plavix cellular tissue based products placed in the center since last visit: Has Compression in Place as Prescribed: Yes Pain Present Now: No Electronic Signature(s) Signed: 01/14/2022 5:14:53 PM By: [name] Entered By: Blanche East on 01/14/2022 12:55:19 -------------------------------------------------------------------------------- Encounter Discharge Information Details Patient Name: Date of Service: Michael [name], Michael J Ranch C. 01/14/2022 1:00 PM Medical Record Number: [id number] Patient Account Number: [id number] Date of Birth/Sex: Treating RN: 10-30-1950 (71 y.o. Michael [name] Primary Care Tanecia Mccay: [name] Other Clinician: Referring Van Ehlert: Treating Young Brim/Extender: [name] in Treatment: 5 Encounter Discharge Information Items Post Procedure Vitals Discharge  Condition: Stable Temperature (F): 98.0 Ambulatory Status: Ambulatory Pulse (bpm): 109 Discharge Destination: Home Respiratory Rate (breaths/min): 18 Transportation: Private Auto Blood Pressure (mmHg): 138/67 Accompanied By: wife Schedule Follow-up Appointment: Yes Clinical Summary of Care: Electronic Signature(s) Signed: 01/14/2022 5:14:53 PM By: [name] Entered By: Blanche East on 01/14/2022 13:49:45 [name] ([id number]) [id number] Page 2 of 10 -------------------------------------------------------------------------------- Lower Extremity Assessment Details Patient Name: Date of Service: Michael [name], Michael C. 01/14/2022 1:00 PM Medical Record Number: [id number] Patient Account Number: [id number] Date of Birth/Sex: Treating RN: [birth year] (71 y.o. Michael [name] Primary Care Travus Oren: [name] Other Clinician: Referring Beni Turrell: Treating Alic Hilburn/Extender: [name] in Treatment: 5 Edema Assessment Assessed: Shirlyn Goltz: No] [Right: No] Edema: [Left: Ye] [Right: s] Calf Left: Right: Point of Measurement: 35 cm From Medial Instep 38 cm Ankle Left: Right: Point of Measurement: 12 cm From Medial Instep 24 cm Vascular Assessment Pulses: Dorsalis Pedis Palpable: [Right:Yes] Electronic Signature(s) Signed: 01/14/2022 5:14:53 PM By: [name] Entered By: Blanche East on 01/14/2022 13:03:41 -------------------------------------------------------------------------------- Multi Wound Chart Details Patient Name: Date of Service: Michael [name], Michael C. 01/14/2022 1:00 PM Medical Record Number: [id number] Patient Account Number: [id number] Date of Birth/Sex: Treating RN: 03/28/1950 (71 y.o. M) Primary Care Ericberto Padget: [name] Other Clinician: Referring Lennon Richins: Treating Janaisa Birkland/Extender: [name] in Treatment: 5 Vital Signs Height(in): 72 Capillary Blood  Glucose(mg/dl): 262 Weight(lbs): 208 Pulse(bpm): 109 Body Mass Index(BMI): 28.2 Blood Pressure(mmHg): 138/67 Temperature(F): 98.0 Respiratory Rate(breaths/min): 18 [3:Photos:] [5:No Photos] [6:No Photos [id number] Page 3 of 10] Right, Anterior Lower Leg Right, Medial Upper Leg Right Groin Wound Location: Blister Surgical Injury Surgical Injury Wounding Event: Diabetic Wound/Ulcer of the Lower Diabetic Wound/Ulcer of the Lower Diabetic Wound/Ulcer of the Lower Primary Etiology: Extremity Extremity Extremity Chronic sinus problems/congestion, Chronic sinus problems/congestion, Chronic sinus problems/congestion, Comorbid History: Hypertension, Peripheral Arterial Hypertension, Peripheral Arterial Hypertension, Peripheral Arterial Disease, Peripheral Venous Disease, Disease, Peripheral Venous Disease, Disease, Peripheral  Venous Disease, Type II Diabetes Type II Diabetes Type II Diabetes 09/30/2021 11/18/2021 11/18/2021 Date Acquired: 5 0 0 Weeks of Treatment: Open Open Open Wound Status: No No No Wound Recurrence: 4.5x4.5x0.2 1.8x1.1x0.1 1x0.5x0.1 Measurements L x W x D (cm) 15.904 1.555 0.393 A (cm) : rea 3.181 0.156 0.039 Volume (cm) : 9.40% [name]/A [name]/A % Reduction in A rea: 39.60% [name]/A [name]/A % Reduction in Volume: Grade 2 Grade 1 Grade 1 Classification: Medium Medium Medium Exudate A mount: Serosanguineous Serous Serous Exudate Type: red, brown amber amber Exudate Color: Distinct, outline attached [name]/A [name]/A Wound Margin: Small (1-33%) Medium (34-66%) Medium (34-66%) Granulation A mount: Red, Pink [name]/A Red Granulation Quality: Large (67-100%) Medium (34-66%) Medium (34-66%) Necrotic A mount: Fat Layer (Subcutaneous Tissue): Yes Fat Layer (Subcutaneous Tissue): Yes Fat Layer (Subcutaneous Tissue): Yes Exposed Structures: Tendon: Yes Fascia: No Fascia: No Fascia: No Tendon: No Tendon: No Muscle: No Muscle: No Muscle: No Joint: No Joint:  No Joint: No Bone: No Bone: No Small (1-33%) [name]/A Small (1-33%) Epithelialization: Debridement - Selective/Open Wound Debridement - Selective/Open Wound Debridement - Selective/Open Wound Debridement: Pre-procedure Verification/Time Out 13:18 13:18 13:18 Taken: Lidocaine 5% topical ointment Lidocaine 5% topical ointment Lidocaine 5% topical ointment Pain Control: [employer] Tissue Debrided: Non-Viable Tissue Non-Viable Tissue Non-Viable Tissue Level: 20.25 1.98 0.5 Debridement A (sq cm): rea Curette Curette Curette Instrument: Minimum Minimum Minimum Bleeding: Pressure Pressure Pressure Hemostasis A chieved: Procedure was tolerated well Procedure was tolerated well Procedure was tolerated well Debridement Treatment Response: 4.5x4.5x0.1 1.8x1.1x0.1 1.8x0.5x0.1 Post Debridement Measurements L x W x D (cm) 1.59 0.156 0.071 Post Debridement Volume: (cm) No Abnormalities Noted Periwound Skin Texture: No Abnormalities Noted Periwound Skin Moisture: Atrophie Blanche: No Periwound Skin Color: Cyanosis: No Erythema: No No Abnormality [name]/A [name]/A Temperature: Yes [name]/A [name]/A Tenderness on Palpation: Debridement Debridement Debridement Procedures Performed: Treatment Notes Electronic Signature(s) Signed: 01/14/2022 1:38:00 PM By: [name] Previous Signature: 01/14/2022 1:25:52 PM Version By: [name] Entered By: Fredirick Maudlin on 01/14/2022 13:37:59 -------------------------------------------------------------------------------- Multi-Disciplinary Care Plan Details Patient Name: Date of Service: Michael [name], Michael C. 01/14/2022 1:00 PM Medical Record Number: [id number] Patient Account Number: [id number] Date of Birth/Sex: Treating RN: 1950/06/14 (71 y.o. Michael [name] Primary Care Zackory Pudlo: [name] Other Clinician: Referring Lilyth Lawyer: Treating Katalena Malveaux/Extender: [name] in Treatment:  5 JUANMIGUEL, DEFELICE ([id number]) [id number] Page 4 of 10 Active Inactive Nutrition Nursing Diagnoses: Potential for alteratiion in Nutrition/Potential for imbalanced nutrition Goals: Patient/caregiver agrees to and verbalizes understanding of need to use nutritional supplements and/or vitamins as prescribed Date Initiated: 12/04/2021 Target Resolution Date: 01/28/2022 Goal Status: Active Patient/caregiver verbalizes understanding of need to maintain therapeutic glucose control per primary care physician Date Initiated: 12/04/2021 Target Resolution Date: 01/28/2022 Goal Status: Active Interventions: Assess patient nutrition upon admission and as needed per policy Provide education on elevated blood sugars and impact on wound healing Provide education on nutrition Treatment Activities: Education provided on Nutrition : 01/07/2022 Notes: Wound/Skin Impairment Nursing Diagnoses: Impaired tissue integrity Knowledge deficit related to ulceration/compromised skin integrity Goals: Patient/caregiver will verbalize understanding of skin care regimen Date Initiated: 12/04/2021 Target Resolution Date: 01/28/2022 Goal Status: Active Ulcer/skin breakdown will have a volume reduction of 30% by week 4 Date Initiated: 12/04/2021 Date Inactivated: 01/07/2022 Target Resolution Date: 12/26/2021 Goal Status: Met Ulcer/skin breakdown will have a volume reduction of 50% by week 8 Date Initiated: 01/07/2022 Target Resolution Date: 01/28/2022 Goal Status: Active Interventions: Assess ulceration(s) every visit Provide  education on ulcer and skin care Notes: Electronic Signature(s) Signed: 01/14/2022 5:14:53 PM By: [name] Entered By: Blanche East on 01/14/2022 13:11:08 -------------------------------------------------------------------------------- Pain Assessment Details Patient Name: Date of Service: Michael [name], Michael C. 01/14/2022 1:00 PM Medical Record  Number: [id number] Patient Account Number: [id number] Date of Birth/Sex: Treating RN: 1950-09-07 (71 y.o. Michael [name] Primary Care Sherma Vanmetre: [name] Other Clinician: Referring Garen Woolbright: Treating Jaidon Ellery/Extender: [name] in Treatment: 5 Active Problems Location of Pain Severity and Description of Pain Patient Has Paino No Site Locations FOUAD, TAUL ([id number]) [id number] Page 5 of 10 Pain Management and Medication Current Pain Management: Electronic Signature(s) Signed: 01/14/2022 5:14:53 PM By: [name] Entered By: Blanche East on 01/14/2022 13:03:18 -------------------------------------------------------------------------------- Patient/Caregiver Education Details Patient Name: Date of Service: Michael [name], Michael Hill. 11/15/2023andnbsp1:00 PM Medical Record Number: [id number] Patient Account Number: [id number] Date of Birth/Gender: Treating RN: 22-Mar-1950 (71 y.o. Michael [name] Primary Care Physician: [name] Other Clinician: Referring Physician: Treating Physician/Extender: [name] in Treatment: 5 Education Assessment Education Provided To: Patient Education Topics Provided Elevated Blood Sugar/ Impact on Healing: Methods: Explain/Verbal Responses: Reinforcements needed, State content correctly Nutrition: Methods: Explain/Verbal Responses: Reinforcements needed, State content correctly Wound/Skin Impairment: Methods: Explain/Verbal Responses: Reinforcements needed, State content correctly Electronic Signature(s) Signed: 01/14/2022 5:14:53 PM By: [name] Entered By: Blanche East on 01/14/2022 13:11:31 [name] ([id number]) [id number] Page 6 of 10 -------------------------------------------------------------------------------- Wound Assessment Details Patient Name: Date of Service: Michael [name], Michael C.  01/14/2022 1:00 PM Medical Record Number: [id number] Patient Account Number: [id number] Date of Birth/Sex: Treating RN: 1950-08-24 (71 y.o. Michael [name] Primary Care Shawan Tosh: [name] Other Clinician: Referring Trace Cederberg: Treating Jennifer Holland/Extender: [name] in Treatment: 5 Wound Status Wound Number: 3 Primary Diabetic Wound/Ulcer of the Lower Extremity Etiology: Wound Location: Right, Anterior Lower Leg Wound Open Wounding Event: Blister Status: Date Acquired: 09/30/2021 Comorbid Chronic sinus problems/congestion, Hypertension, Peripheral Weeks Of Treatment: 5 History: Arterial Disease, Peripheral Venous Disease, Type II Diabetes Clustered Wound: No Photos Wound Measurements Length: (cm) 4.5 Width: (cm) 4.5 Depth: (cm) 0.2 Area: (cm) 15 Volume: (cm) 3. % Reduction in Area: 9.4% % Reduction in Volume: 39.6% Epithelialization: Small (1-33%) .904 Tunneling: No 181 Undermining: No Wound Description Classification: Grade 2 Wound Margin: Distinct, outline attached Exudate Amount: Medium Exudate Type: Serosanguineous Exudate Color: red, brown Foul Odor After Cleansing: No Slough/Fibrino Yes Wound Bed Granulation Amount: Small (1-33%) Exposed Structure Granulation Quality: Red, Pink Fascia Exposed: No Necrotic Amount: Large (67-100%) Fat Layer (Subcutaneous Tissue) Exposed: Yes Necrotic Quality: Adherent Slough Tendon Exposed: Yes Muscle Exposed: No Joint Exposed: No Bone Exposed: No Periwound Skin Texture Texture Color No Abnormalities Noted: Yes No Abnormalities Noted: Yes Moisture Temperature / Pain No Abnormalities Noted: Yes Temperature: No Abnormality Tenderness on Palpation: Yes Treatment Notes Wound #3 (Lower Leg) Wound Laterality: Right, Anterior Cleanser ARTY, LANTZY ([id number]) [id number] Page 7 of 10 Peri-Wound Care Sween Lotion (Moisturizing lotion) Discharge Instruction: Apply  moisturizing lotion as directed Topical Primary Dressing Promogran Prisma Matrix, 4.34 (sq in) (silver collagen) Discharge Instruction: Moisten collagen with saline or hydrogel Secondary Dressing ABD Pad, 8x10 Discharge Instruction: Apply over primary dressing as directed. Woven Gauze Sponge, Non-Sterile 4x4 in Discharge Instruction: Apply over primary dressing as directed. Secured With [employer] 4x5 (in/yd) Discharge Instruction: Secure with Coban as directed. Stretch Net Size 5, 10 (yds) Compression Wrap Compression Stockings Add-Ons Electronic Signature(s)  Signed: 01/14/2022 5:14:53 PM By: [name] Entered By: Blanche East on 01/14/2022 13:10:56 -------------------------------------------------------------------------------- Wound Assessment Details Patient Name: Date of Service: Michael [name], Michael C. 01/14/2022 1:00 PM Medical Record Number: [id number] Patient Account Number: [id number] Date of Birth/Sex: Treating RN: Mar 29, 1950 (71 y.o. Michael [name] Primary Care Alin Chavira: [name] Other Clinician: Referring Holston Oyama: Treating Beza Steppe/Extender: [name] in Treatment: 5 Wound Status Wound Number: 5 Primary Diabetic Wound/Ulcer of the Lower Extremity Etiology: Wound Location: Right, Medial Upper Leg Wound Open Wounding Event: Surgical Injury Status: Date Acquired: 11/18/2021 Comorbid Chronic sinus problems/congestion, Hypertension, Peripheral Weeks Of Treatment: 0 History: Arterial Disease, Peripheral Venous Disease, Type II Diabetes Clustered Wound: No Wound Measurements Length: (cm) 1.8 Width: (cm) 1.1 Depth: (cm) 0.1 Area: (cm) 1.555 Volume: (cm) 0.156 % Reduction in Area: % Reduction in Volume: Tunneling: No Undermining: No Wound Description Classification: Grade 1 Exudate Amount: Medium Exudate Type: Serous Exudate Color: amber Foul Odor After Cleansing: No Slough/Fibrino Yes Wound  Bed Granulation Amount: Medium (34-66%) Exposed Structure Necrotic Amount: Medium (34-66%) Fascia Exposed: No Fat Layer (Subcutaneous Tissue) Exposed: Yes Tendon Exposed: No Caplin, Donna C ([id number]) [id number] Page 8 of 10 Muscle Exposed: No Joint Exposed: No Bone Exposed: No Periwound Skin Texture Texture Color No Abnormalities Noted: No No Abnormalities Noted: No Moisture No Abnormalities Noted: No Treatment Notes Wound #5 (Upper Leg) Wound Laterality: Right, Medial Cleanser Soap and Water Discharge Instruction: May shower and wash wound with dial antibacterial soap and water prior to dressing change. Peri-Wound Care Topical Primary Dressing Promogran Prisma Matrix, 4.34 (sq in) (silver collagen) Discharge Instruction: Moisten collagen with saline or hydrogel Secondary Dressing ALLEVYN Gentle Border, 4x4 (in/in) Discharge Instruction: Apply over primary dressing as directed. Secured With Compression Wrap Compression Stockings Environmental education officer) Signed: 01/14/2022 5:14:53 PM By: [name] Entered By: Blanche East on 01/14/2022 13:27:43 -------------------------------------------------------------------------------- Wound Assessment Details Patient Name: Date of Service: Michael [name], Michael C. 01/14/2022 1:00 PM Medical Record Number: [id number] Patient Account Number: [id number] Date of Birth/Sex: Treating RN: [birth year] (71 y.o. Michael [name] Primary Care Tanveer Dobberstein: [name] Other Clinician: Referring Dequincy Born: Treating Ahmia Colford/Extender: [name] in Treatment: 5 Wound Status Wound Number: 6 Primary Diabetic Wound/Ulcer of the Lower Extremity Etiology: Wound Location: Right Groin Wound Open Wounding Event: Surgical Injury Status: Date Acquired: 11/18/2021 Comorbid Chronic sinus problems/congestion, Hypertension, Peripheral Weeks Of Treatment: 0 History: Arterial Disease,  Peripheral Venous Disease, Type II Diabetes Clustered Wound: No Wound Measurements Length: (cm) 1 Width: (cm) 0.5 Depth: (cm) 0.1 Area: (cm) 0.393 Volume: (cm) 0.039 % Reduction in Area: % Reduction in Volume: Epithelialization: Small (1-33%) Tunneling: No Undermining: No Wound Description Oyen, Gid C ([id number]) Classification: Grade 1 Exudate Amount: Medium Exudate Type: Serous Exudate Color: amber [id number] Page 9 of 10 Foul Odor After Cleansing: No Slough/Fibrino Yes Wound Bed Granulation Amount: Medium (34-66%) Exposed Structure Granulation Quality: Red Fascia Exposed: No Necrotic Amount: Medium (34-66%) Fat Layer (Subcutaneous Tissue) Exposed: Yes Necrotic Quality: Adherent Slough Tendon Exposed: No Muscle Exposed: No Joint Exposed: No Periwound Skin Texture Texture Color No Abnormalities Noted: No No Abnormalities Noted: No Moisture No Abnormalities Noted: No Treatment Notes Wound #6 (Groin) Wound Laterality: Right Cleanser Soap and Water Discharge Instruction: May shower and wash wound with dial antibacterial soap and water prior to dressing change. Peri-Wound Care Topical Primary Dressing Promogran Prisma Matrix, 4.34 (sq in) (silver collagen) Discharge Instruction: Moisten collagen with saline or hydrogel Secondary Dressing ALLEVYN  Gentle Border, 4x4 (in/in) Discharge Instruction: Apply over primary dressing as directed. Secured With Compression Wrap Compression Stockings Environmental education officer) Signed: 01/14/2022 5:14:53 PM By: [name] Entered By: Blanche East on 01/14/2022 13:26:59 -------------------------------------------------------------------------------- Vitals Details Patient Name: Date of Service: Michael Bosworth, Melfa C. 01/14/2022 1:00 PM Medical Record Number: [id number] Patient Account Number: [id number] Date of Birth/Sex: Treating RN: 1951/01/10 (71 y.o. Michael [name] Primary  Care Nakhi Choi: [name] Other Clinician: Referring Javier Mamone: Treating Jaliah Foody/Extender: [name] in Treatment: 5 Vital Signs Time Taken: 12:55 Temperature (F): 98.0 Height (in): 72 Pulse (bpm): 109 Weight (lbs): 208 Respiratory Rate (breaths/min): 18 Body Mass Index (BMI): 28.2 Blood Pressure (mmHg): 138/67 Capillary Blood Glucose (mg/dl): 262 Reference Range: 80 - 120 mg / dl Vine, Taj C ([id number]) [id number] Page 10 of 10 Electronic Signature(s) Signed: 01/14/2022 5:14:53 PM By: [name] Entered By: Blanche East on 01/14/2022 12:58:00

## 2022-01-15 NOTE — Progress Notes (Signed)
RAUN, ROUTH ([id number]) [id number] Page 1 of 12 Visit Report for 01/14/2022 Chief Complaint Document Details Patient Name: Date of Service: Michael [name], [name] 01/14/2022 1:00 PM Medical Record Number: [id number] Patient Account Number: [id number] Date of Birth/Sex: Treating RN: January 20, 1951 (71 y.o. M) Primary Care Provider: [name] Other Clinician: Referring Provider: Treating Provider/Extender: [name] in Treatment: 5 Information Obtained from: Patient Chief Complaint Patient presents to the wound care center today with an open arterial ulcer to the right lower extremity in the setting of diabetes mellitus. he has had this problem for 7 months 12/04/2021: ulcer to right lower anterior tibial surface Electronic Signature(s) Signed: 01/14/2022 1:38:07 PM By: [name] Entered By: Fredirick Maudlin on 01/14/2022 13:38:07 -------------------------------------------------------------------------------- Debridement Details Patient Name: Date of Service: Michael [name], [name] 01/14/2022 1:00 PM Medical Record Number: [id number] Patient Account Number: [id number] Date of Birth/Sex: Treating RN: 07/19/1950 (71 y.o. [name] Primary Care Provider: [name] Other Clinician: Referring Provider: Treating Provider/Extender: [name] in Treatment: 5 Debridement Performed for Assessment: Wound #5 Right,Medial Upper Leg Performed By: Physician Fredirick Maudlin, MD Debridement Type: Debridement Severity of Tissue Pre Debridement: Fat layer exposed Level of Consciousness (Pre-procedure): Awake and Alert Pre-procedure Verification/Time Out Yes - 13:18 Taken: Start Time: 13:17 Pain Control: Lidocaine 5% topical ointment T Area Debrided (L x W): otal 1.8 (cm) x 1.1 (cm) = 1.98 (cm) Tissue and other material debrided: Non-Viable, Slough, Slough Level: Non-Viable  Tissue Debridement Description: Selective/Open Wound Instrument: Curette Bleeding: Minimum Hemostasis Achieved: Pressure Response to Treatment: Procedure was tolerated well Level of Consciousness (Post- Awake and Alert procedure): Post Debridement Measurements of Total Wound Length: (cm) 1.8 Width: (cm) 1.1 Depth: (cm) 0.1 Volume: (cm) 0.156 Character of Wound/Ulcer Post Debridement: Requires Further Debridement Severity of Tissue Post Debridement: Fat layer exposed Michael [name], Michael [name] ([id number]) [id number] Page 2 of 12 Post Procedure Diagnosis Same as Pre-procedure Notes Scribed for Dr. Celine Ahr by Blanche East, RN Electronic Signature(s) Signed: 01/14/2022 5:14:53 PM By: [name] Signed: 01/14/2022 5:18:05 PM By: [name] Entered By: Blanche East on 01/14/2022 13:28:20 -------------------------------------------------------------------------------- Debridement Details Patient Name: Date of Service: Michael [name], [name] 01/14/2022 1:00 PM Medical Record Number: [id number] Patient Account Number: [id number] Date of Birth/Sex: Treating RN: 1950-04-11 (71 y.o. [name] Primary Care Provider: [name] Other Clinician: Referring Provider: Treating Provider/Extender: [name] in Treatment: 5 Debridement Performed for Assessment: Wound #6 Right Groin Performed By: Physician Fredirick Maudlin, MD Debridement Type: Debridement Severity of Tissue Pre Debridement: Fat layer exposed Level of Consciousness (Pre-procedure): Awake and Alert Pre-procedure Verification/Time Out Yes - 13:18 Taken: Start Time: 13:17 Pain Control: Lidocaine 5% topical ointment T Area Debrided (L x W): otal 1 (cm) x 0.5 (cm) = 0.5 (cm) Tissue and other material debrided: Non-Viable, Slough, Slough Level: Non-Viable Tissue Debridement Description: Selective/Open Wound Instrument: Curette Bleeding:  Minimum Hemostasis Achieved: Pressure Response to Treatment: Procedure was tolerated well Level of Consciousness (Post- Awake and Alert procedure): Post Debridement Measurements of Total Wound Length: (cm) 1.8 Width: (cm) 0.5 Depth: (cm) 0.1 Volume: (cm) 0.071 Character of Wound/Ulcer Post Debridement: Requires Further Debridement Severity of Tissue Post Debridement: Fat layer exposed Post Procedure Diagnosis Same as Pre-procedure Notes Scribed for Dr. Celine Ahr by Blanche East, RN Electronic Signature(s) Signed: 01/14/2022 5:14:53 PM By: [name] Signed: 01/14/2022 5:18:05 PM By: [name] Entered By: Blanche East  on 01/14/2022 13:28:48 Michael [name], Michael [name] ([id number]) [id number] Page 3 of 12 -------------------------------------------------------------------------------- Debridement Details Patient Name: Date of Service: Michael [name], [name] 01/14/2022 1:00 PM Medical Record Number: [id number] Patient Account Number: [id number] Date of Birth/Sex: Treating RN: 03-Aug-1950 (71 y.o. [name] Primary Care Provider: [name] Other Clinician: Referring Provider: Treating Provider/Extender: [name] in Treatment: 5 Debridement Performed for Assessment: Wound #3 Right,Anterior Lower Leg Performed By: Physician Fredirick Maudlin, MD Debridement Type: Debridement Severity of Tissue Pre Debridement: Fat layer exposed Level of Consciousness (Pre-procedure): Awake and Alert Pre-procedure Verification/Time Out Yes - 13:18 Taken: Start Time: 13:17 Pain Control: Lidocaine 5% topical ointment T Area Debrided (L x W): otal 4.5 (cm) x 4.5 (cm) = 20.25 (cm) Tissue and other material debrided: Non-Viable, Slough, Slough Level: Non-Viable Tissue Debridement Description: Selective/Open Wound Instrument: Curette Bleeding: Moderate Hemostasis Achieved: Silver Nitrate Response to Treatment: Procedure  was tolerated well Level of Consciousness (Post- Awake and Alert procedure): Post Debridement Measurements of Total Wound Length: (cm) 4.5 Width: (cm) 4.5 Depth: (cm) 0.1 Volume: (cm) 1.59 Character of Wound/Ulcer Post Debridement: Requires Further Debridement Severity of Tissue Post Debridement: Fat layer exposed Post Procedure Diagnosis Same as Pre-procedure Notes Scribed for Dr. Celine Ahr by Blanche East, RN Electronic Signature(s) Signed: 01/14/2022 5:14:53 PM By: [name] Signed: 01/14/2022 5:18:05 PM By: [name] Entered By: Blanche East on 01/14/2022 14:27:16 -------------------------------------------------------------------------------- HPI Details Patient Name: Date of Service: Michael [place], [place] [name]. 01/14/2022 1:00 PM Medical Record Number: [id number] Patient Account Number: [id number] Date of Birth/Sex: Treating RN: 10/18/1950 (71 y.o. M) Primary Care Provider: [name] Other Clinician: Referring Provider: Treating Provider/Extender: [name] in Treatment: 5 History of Present Illness Location: right lateral calf closer to the knee Quality: Patient reports experiencing a dull pain to affected area(s). Severity: Patient states wound(s) are getting worse. Duration: Patient has had the wound for > 7 months prior to seeking treatment at the wound center Timing: Pain in wound is constant (hurts all the time) Context: The wound would happen gradually Modifying Factors: Patient wound(s)/ulcer(s) are worsening due to : no resolution and a white material at the base of the wound ssociated Signs and Symptoms: Patient reports having:no discharge or purulent material A HPI Description: 71 year old gentleman who has been referred to was from his PCP for a chronic ulceration on his right lower extremity which she's had for CHAYNCE, SCHAFER ([id number]) [id number] Page 4 of 12 several weeks.  Past medical history significant for diabetes mellitus type 2, hypertension, hyperlipidemia, anxiety, obesity, peripheral vascular disease and chronic kidney disease. He has never been a smoker. Most recent lab work done at his PCPs office showed a glucose of 217 milligrams per deciliter which is consistent with hyperglycemia. In January 2017 recent hemoglobin A1c values noted to be 8.2%. In April 2017, he has been seen at the vascular office by Dr. Trula Slade and Dr. Kellie Simmering for right leg claudication. He had a right superficial femoral artery stent in September 2012. Recent noninvasive vascular imaging done on 06/24/2015 showed a right ABI of 1.07 with a triphasic waveform and a right TBI of 0.81. The left was noncompressible with a triphasic waveform and a TBI of 1.17. Right lower extremity arterial duplex was unable to identify stent exit but they were elevated velocities at the stent and in the distal thigh with triphasic waveforms. Dr. Stephens Shire opinion was to return to the clinic in 6 months with ABI  and right lower extremity arterial duplex studies to be done. He has seen a dermatologist who had done a biopsy and said it was benign and he was given a steroid cream. 10/23/2015 -- Pathology report of the biopsy done in April 2017 shows ulcer with underlying angiodermatitis consistent with stasis dermatitis. 10/30/2015 -- he is going for shoulder surgery in about 10 days' time and was asking about the perioperative care. His blood sugars are running in the high 100s or low 200s. No hemoglobin A1c done recently. 11/20/2015 -- is back up to 2 weeks because of recent left shoulder surgery. 12/04/15; patient returns today with the wound for the most part looking healthy. No evidence of infection no debridement required. He is using Prisma for 4 weeks, without obvious improvement per her intake nurse. This started as several small open areas that were raised erythematous. He saw dermatology and at some  point this was biopsied that just suggested stasis skin physiology. This certainly doesn't look like that 12/11/15; using Hydrafera Blue. Previous biopsy reviewed, no atypia PAS negative. He has a history of stents in the right leg however he does not appear to have primary arterial insufficiency ABI in this clinic was over 0.9. 12/25/2015 -- he has been approved for grafix and we will order for some to be applied next week 01/01/2016 -- he has had his first application of Grafix today 01/08/2016 -- he has had his second application of Grafix today READMISSION 12/04/2021 This is a now 71 year old man who was followed in our clinic about 5 years ago for an ulceration on his right lateral lower leg, just distal to the knee. He has since undergone a number of revascularization procedures that have been complicated by restenosis and wound infections. During the course of this treatment, he developed a small ulcer on his right anterior tibial surface. Despite use of an Unna boot, the wound has continued to expand. He was referred to the wound care center by Dr. Trula Slade for further evaluation and management. On the right anterior tibial surface, there is an irregular wound with heavy slough and eschar accumulation. The periwound skin is intact but he does have 1-2+ pitting edema. There is no purulent drainage or malodor. 10/13; second visit for this man who has an ischemic wound in the setting of type 2 diabetes on the right anterior mid tibia area. He has been revascularized. Using Santyl gent 12/22/2021: The wound is about the same size this week. There is a lot of gray sloughy material on the surface, secondary to silver nitrate used to stop bleeding after what sounds like a fairly aggressive debridement last week. 12/30/2021: The wound is smaller this week and a bit cleaner. Edema control is excellent. There is still a bit of slough accumulation. 01/07/2022: The wound continues to contract and is much  cleaner this week. Edema control is very good. He has small openings in his upper leg surgical scars, but he is going to be seeing Dr. Trula Slade next Monday and will have him take a look. He has been applying manuka honey to both of the sites. We have been using Santyl and gentamicin on his lower leg wound. 01/14/2022: He saw vascular surgery this week and was told that they were happy to have [place] manage the 2 wounds from his operation. Both of these have a light layer of slough on the surface. The more distal wound is a little bit dry. The anterior tibial wound continues to accumulate a fair amount of  slough. Edema control is good. Electronic Signature(s) Signed: 01/14/2022 1:39:09 PM By: [name] Entered By: Fredirick Maudlin on 01/14/2022 13:39:09 -------------------------------------------------------------------------------- Physical Exam Details Patient Name: Date of Service: Michael [place], [place] [name]. 01/14/2022 1:00 PM Medical Record Number: [id number] Patient Account Number: [id number] Date of Birth/Sex: Treating RN: 1951-01-03 (71 y.o. M) Primary Care Provider: [name] Other Clinician: Referring Provider: Treating Provider/Extender: [name] in Treatment: 5 Constitutional . Slightly tachycardic, asymptomatic. . . No acute distress.Marland Kitchen Respiratory Normal work of breathing on room air.. Notes 01/14/2022: Both of the upper leg wounds have a light layer of slough on the surface. The more distal wound is a little bit dry. The anterior tibial wound continues to accumulate a fair amount of slough. Edema control is good. Electronic Signature(s) Michael [name], Michael [name] ([id number]) [id number] Page 5 of 12 Signed: 01/14/2022 1:40:01 PM By: [name] Entered By: Fredirick Maudlin on 01/14/2022 13:40:00 -------------------------------------------------------------------------------- Physician Orders  Details Patient Name: Date of Service: Michael [place], [place] [name]. 01/14/2022 1:00 PM Medical Record Number: [id number] Patient Account Number: [id number] Date of Birth/Sex: Treating RN: 10-07-1950 (71 y.o. [name] Primary Care Provider: [name] Other Clinician: Referring Provider: Treating Provider/Extender: [name] in Treatment: 5 Verbal / Phone Orders: No Diagnosis Coding Follow-up Appointments ppointment in 1 week. - Dr. Celine Ahr - room 4 Return A Monday 01/19/22 at 1:00PM Anesthetic Wound #3 Right,Anterior Lower Leg (In clinic) Topical Lidocaine 5% applied to wound bed Bathing/ Shower/ Hygiene May shower with protection but do not get wound dressing(s) wet. - purchase cast protector from [facility], [facility] or [employer] Edema Control - Lymphedema / SCD / Other Right Lower Extremity Elevate legs to the level of the heart or above for 30 minutes daily and/or when sitting, a frequency of: Avoid standing for long periods of time. Wound Treatment Wound #3 - Lower Leg Wound Laterality: Right, Anterior Peri-Wound Care: Sween Lotion (Moisturizing lotion) Discharge Instructions: Apply moisturizing lotion as directed Prim Dressing: Promogran Prisma Matrix, 4.34 (sq in) (silver collagen) ary Discharge Instructions: Moisten collagen with saline or hydrogel Secondary Dressing: ABD Pad, 8x10 Discharge Instructions: Apply over primary dressing as directed. Secondary Dressing: Woven Gauze Sponge, Non-Sterile 4x4 in Discharge Instructions: Apply over primary dressing as directed. Secured With: [employer], 4.5x3.1 (in/yd) Discharge Instructions: Secure with Kerlix as directed. Secured With: [employer] Size 5, 10 (yds) Compression Wrap: Unnaboot w/Calamine, 4x10 (in/yd) Discharge Instructions: Apply Unnaboot to top of dressing and around ankles to prevent dressing from sliding Compression Wrap: Kerlix Roll 4.5x3.1 (in/yd) Discharge Instructions:  Apply Kerlix and Coban compression as directed. Compression Wrap: Coban Self-Adherent Wrap 4x5 (in/yd) Discharge Instructions: Apply over Kerlix as directed. Wound #5 - Upper Leg Wound Laterality: Right, Medial Cleanser: Soap and Water 1 x Per Day/30 Days Discharge Instructions: May shower and wash wound with dial antibacterial soap and water prior to dressing change. Prim Dressing: Promogran Prisma Matrix, 4.34 (sq in) (silver collagen) (DME) (Dispense As Written) 1 x Per Day/30 Days ary Discharge Instructions: Moisten collagen with saline or hydrogel Secondary Dressing: ALLEVYN Gentle Border, 4x4 (in/in) (DME) (Dispense As Written) 1 x Per Day/30 Days Discharge Instructions: Apply over primary dressing as directed. Michael [name], Michael [name] ([id number]) [id number] Page 6 of 12 Wound #6 - Groin Wound Laterality: Right Cleanser: Soap and Water 1 x Per Day/30 Days Discharge Instructions: May shower and wash wound with dial antibacterial soap and water prior to dressing change. Prim Dressing: Promogran  Prisma Matrix, 4.34 (sq in) (silver collagen) (DME) (Dispense As Written) 1 x Per Day/30 Days ary Discharge Instructions: Moisten collagen with saline or hydrogel Secondary Dressing: ALLEVYN Gentle Border, 4x4 (in/in) (DME) (Dispense As Written) 1 x Per Day/30 Days Discharge Instructions: Apply over primary dressing as directed. Electronic Signature(s) Signed: 01/14/2022 5:14:53 PM By: [name] Signed: 01/14/2022 5:18:05 PM By: [name] Entered By: Blanche East on 01/14/2022 16:46:17 -------------------------------------------------------------------------------- Problem List Details Patient Name: Date of Service: Michael [place], [place] [name]. 01/14/2022 1:00 PM Medical Record Number: [id number] Patient Account Number: [id number] Date of Birth/Sex: Treating RN: Jul 05, 1950 (71 y.o. M) Primary Care Provider: [name] Other Clinician: Referring  Provider: Treating Provider/Extender: [name] in Treatment: 5 Active Problems ICD-10 Encounter Code Description Active Date MDM Diagnosis L97.815 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 12/04/2021 No Yes involvement without evidence of necrosis I70.239 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 12/04/2021 No Yes E11.622 Type 2 diabetes mellitus with other skin ulcer 12/04/2021 No Yes I73.9 Peripheral vascular disease, unspecified 12/04/2021 No Yes L97.122 Non-pressure chronic ulcer of left thigh with fat layer exposed 01/14/2022 No Yes Inactive Problems Resolved Problems Electronic Signature(s) Signed: 01/14/2022 1:37:50 PM By: [name] Previous Signature: 01/14/2022 1:25:04 PM Version By: [name] Entered By: Fredirick Maudlin on 01/14/2022 13:37:50 Michael [name], Michael [name] ([id number]) [id number] Page 7 of 12 -------------------------------------------------------------------------------- Progress Note Details Patient Name: Date of Service: Michael [name], [name] 01/14/2022 1:00 PM Medical Record Number: [id number] Patient Account Number: [id number] Date of Birth/Sex: Treating RN: Jan 28, 1951 (71 y.o. M) Primary Care Provider: [name] Other Clinician: Referring Provider: Treating Provider/Extender: [name] in Treatment: 5 Subjective Chief Complaint Information obtained from Patient Patient presents to the wound care center today with an open arterial ulcer to the right lower extremity in the setting of diabetes mellitus. he has had this problem for 7 months 12/04/2021: ulcer to right lower anterior tibial surface History of Present Illness (HPI) The following HPI elements were documented for the patient's wound: Location: right lateral calf closer to the knee Quality: Patient reports  experiencing a dull pain to affected area(s). Severity: Patient states wound(s) are getting worse. Duration: Patient has had the wound for > 7 months prior to seeking treatment at the wound center Timing: Pain in wound is constant (hurts all the time) Context: The wound would happen gradually Modifying Factors: Patient wound(s)/ulcer(s) are worsening due to : no resolution and a white material at the base of the wound Associated Signs and Symptoms: Patient reports having:no discharge or purulent material 71 year old gentleman who has been referred to was from his PCP for a chronic ulceration on his right lower extremity which she's had for several weeks. Past medical history significant for diabetes mellitus type 2, hypertension, hyperlipidemia, anxiety, obesity, peripheral vascular disease and chronic kidney disease. He has never been a smoker. Most recent lab work done at his PCPs office showed a glucose of 217 milligrams per deciliter which is consistent with hyperglycemia. In January 2017 recent hemoglobin A1c values noted to be 8.2%. In April 2017, he has been seen at the vascular office by Dr. Trula Slade and Dr. Kellie Simmering for right leg claudication. He had a right superficial femoral artery stent in September 2012. Recent noninvasive vascular imaging done on 06/24/2015 showed a right ABI of 1.07 with a triphasic waveform and a right TBI of  0.81. The left was noncompressible with a triphasic waveform and a TBI of 1.17. Right lower extremity arterial duplex was unable to identify stent exit but they were elevated velocities at the stent and in the distal thigh with triphasic waveforms. Dr. Stephens Shire opinion was to return to the clinic in 6 months with ABI and right lower extremity arterial duplex studies to be done. He has seen a dermatologist who had done a biopsy and said it was benign and he was given a steroid cream. 10/23/2015 -- Pathology report of the biopsy done in April 2017 shows ulcer with  underlying angiodermatitis consistent with stasis dermatitis. 10/30/2015 -- he is going for shoulder surgery in about 10 days' time and was asking about the perioperative care. His blood sugars are running in the high 100s or low 200s. No hemoglobin A1c done recently. 11/20/2015 -- is back up to 2 weeks because of recent left shoulder surgery. 12/04/15; patient returns today with the wound for the most part looking healthy. No evidence of infection no debridement required. He is using Prisma for 4 weeks, without obvious improvement per her intake nurse. This started as several small open areas that were raised erythematous. He saw dermatology and at some point this was biopsied that just suggested stasis skin physiology. This certainly doesn't look like that 12/11/15; using Hydrafera Blue. Previous biopsy reviewed, no atypia PAS negative. He has a history of stents in the right leg however he does not appear to have primary arterial insufficiency ABI in this clinic was over 0.9. 12/25/2015 -- he has been approved for grafix and we will order for some to be applied next week 01/01/2016 -- he has had his first application of Grafix today 01/08/2016 -- he has had his second application of Grafix today READMISSION 12/04/2021 This is a now 71 year old man who was followed in our clinic about 5 years ago for an ulceration on his right lateral lower leg, just distal to the knee. He has since undergone a number of revascularization procedures that have been complicated by restenosis and wound infections. During the course of this treatment, he developed a small ulcer on his right anterior tibial surface. Despite use of an Unna boot, the wound has continued to expand. He was referred to the wound care center by Dr. Trula Slade for further evaluation and management. On the right anterior tibial surface, there is an irregular wound with heavy slough and eschar accumulation. The periwound skin is intact but he does  have 1-2+ pitting edema. There is no purulent drainage or malodor. 10/13; second visit for this man who has an ischemic wound in the setting of type 2 diabetes on the right anterior mid tibia area. He has been revascularized. Using Santyl gent 12/22/2021: The wound is about the same size this week. There is a lot of gray sloughy material on the surface, secondary to silver nitrate used to stop bleeding after what sounds like a fairly aggressive debridement last week. 12/30/2021: The wound is smaller this week and a bit cleaner. Edema control is excellent. There is still a bit of slough accumulation. 01/07/2022: The wound continues to contract and is much cleaner this week. Edema control is very good. He has small openings in his upper leg surgical scars, but he is going to be seeing Dr. Trula Slade next Monday and will have him take a look. He has been applying manuka honey to both of the sites. We have been using Santyl and gentamicin on his lower leg wound. 01/14/2022:  He saw vascular surgery this week and was told that they were happy to have [place] manage the 2 wounds from his operation. Both of these have a light layer of slough on the surface. The more distal wound is a little bit dry. The anterior tibial wound continues to accumulate a fair amount of slough. Edema control is good. Patient History Michael [name], Michael [name] ([id number]) [id number] Page 8 of 12 Information obtained from Patient. Family History Cancer - Siblings, Heart Disease - Father, Hypertension - Father,Mother, No family history of Hereditary Spherocytosis, Kidney Disease, Seizures, Stroke, Thyroid Problems, Tuberculosis. Social History Former smoker - smokeless tobacco, Marital Status - Married, Alcohol Use - Rarely - WINE, Drug Use - No History, Caffeine Use - Daily - COFFEE. Medical History Ear/Nose/Mouth/Throat Patient has history of Chronic sinus problems/congestion - seasonal  allergies Cardiovascular Patient has history of Hypertension, Peripheral Arterial Disease - STENTS, Peripheral Venous Disease Endocrine Patient has history of Type II Diabetes - last A1c- 8.2 Hospitalization/Surgery History - left shoulder surgery. Medical A Surgical History Notes nd Constitutional Symptoms (General Health) obesity , h/o (R) leg claudication (right fem stent September 2012) Cardiovascular hyperlipidemia Endocrine pt. on diet intentionally losing 20 pounds since December 2016 in an effort to improve A1C levels Integumentary (Skin) psoriatic arthritis Musculoskeletal bilat knee pain identified as an ortho issue psoriatic arthritis Oncologic Prostate cancer 2018 Psychiatric anxiety Objective Constitutional Slightly tachycardic, asymptomatic. No acute distress.. Vitals Time Taken: 12:55 PM, Height: 72 in, Weight: 208 lbs, BMI: 28.2, Temperature: 98.0 F, Pulse: 109 bpm, Respiratory Rate: 18 breaths/min, Blood Pressure: 138/67 mmHg, Capillary Blood Glucose: 262 mg/dl. Respiratory Normal work of breathing on room air.. General Notes: 01/14/2022: Both of the upper leg wounds have a light layer of slough on the surface. The more distal wound is a little bit dry. The anterior tibial wound continues to accumulate a fair amount of slough. Edema control is good. Integumentary (Hair, Skin) Wound #3 status is Open. Original cause of wound was Blister. The date acquired was: 09/30/2021. The wound has been in treatment 5 weeks. The wound is located on the Right,Anterior Lower Leg. The wound measures 4.5cm length x 4.5cm width x 0.2cm depth; 15.904cm^2 area and 3.181cm^3 volume. There is tendon and Fat Layer (Subcutaneous Tissue) exposed. There is no tunneling or undermining noted. There is a medium amount of serosanguineous drainage noted. The wound margin is distinct with the outline attached to the wound base. There is small (1-33%) red, pink granulation within the wound bed.  There is a large (67-100%) amount of necrotic tissue within the wound bed including Adherent Slough. The periwound skin appearance had no abnormalities noted for texture. The periwound skin appearance had no abnormalities noted for moisture. The periwound skin appearance had no abnormalities noted for color. Periwound temperature was noted as No Abnormality. The periwound has tenderness on palpation. Wound #5 status is Open. Original cause of wound was Surgical Injury. The date acquired was: 11/18/2021. The wound is located on the Right,Medial Upper Leg. The wound measures 1.8cm length x 1.1cm width x 0.1cm depth; 1.555cm^2 area and 0.156cm^3 volume. There is Fat Layer (Subcutaneous Tissue) exposed. There is no tunneling or undermining noted. There is a medium amount of serous drainage noted. There is medium (34-66%) granulation within the wound bed. There is a medium (34-66%) amount of necrotic tissue within the wound bed. Wound #6 status is Open. Original cause of wound was Surgical Injury. The date acquired was: 11/18/2021. The wound is located on the Right Groin.  The wound measures 1cm length x 0.5cm width x 0.1cm depth; 0.393cm^2 area and 0.039cm^3 volume. There is Fat Layer (Subcutaneous Tissue) exposed. There is no tunneling or undermining noted. There is a medium amount of serous drainage noted. There is medium (34-66%) red granulation within the wound bed. There is a medium (34-66%) amount of necrotic tissue within the wound bed including Adherent Slough. 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 There was a Selective/Open Wound Non-Viable Tissue Debridement with a total area of 20.25 sq cm performed by Fredirick Maudlin, MD. With the following instrument(s): Curette to remove Non-Viable tissue/material. Material removed includes [facility] after achieving pain control using Lidocaine 5% topical ointment. No specimens were taken. A time out was conducted at 13:18, prior to the start of the procedure. A Minimum amount of bleeding was controlled with Pressure. The procedure was tolerated well. Post Debridement Measurements: 4.5cm length x 4.5cm width x 0.1cm depth; 1.59cm^3 volume. Character of Wound/Ulcer Post Debridement requires further debridement. Severity of Tissue Post Debridement is: Fat layer exposed. Post procedure Diagnosis Wound #3: Same as Pre-Procedure General Notes: Scribed for Dr. Celine Ahr by Blanche East, RN. Wound #5 Pre-procedure diagnosis of Wound #5 is a Diabetic Wound/Ulcer of the Lower Extremity located on the Right,Medial Upper Leg .Severity of Tissue Pre Debridement is: Fat layer exposed. There was a Selective/Open Wound Non-Viable Tissue Debridement with a total area of 1.98 sq cm performed by Fredirick Maudlin, MD. With the following instrument(s): Curette to remove Non-Viable tissue/material. Material removed includes [facility] after achieving pain control using Lidocaine 5% topical ointment. No specimens were taken. A time out was conducted at 13:18, prior to the start of the procedure. A Minimum amount of bleeding was controlled with Pressure. The procedure was tolerated well. Post Debridement Measurements: 1.8cm length x 1.1cm width x 0.1cm depth; 0.156cm^3 volume. Character of Wound/Ulcer Post Debridement requires further debridement. Severity of  Tissue Post Debridement is: Fat layer exposed. Post procedure Diagnosis Wound #5: Same as Pre-Procedure General Notes: Scribed for Dr. Celine Ahr by Blanche East, RN. Wound #6 Pre-procedure diagnosis of Wound #6 is a Diabetic Wound/Ulcer of the Lower Extremity located on the Right Groin .Severity of Tissue Pre Debridement is: Fat layer exposed. There was a Selective/Open Wound Non-Viable Tissue Debridement with a total area of 0.5 sq cm performed by Fredirick Maudlin, MD. With the following instrument(s): Curette to remove Non-Viable tissue/material. Material removed includes [facility] after achieving pain control using Lidocaine 5% topical ointment. No specimens were taken. A time out was conducted at 13:18, prior to the start of the procedure. A Minimum amount of bleeding was controlled with Pressure. The procedure was tolerated well. Post Debridement Measurements: 1.8cm length x 0.5cm width x 0.1cm depth; 0.071cm^3 volume. Character of Wound/Ulcer Post Debridement requires further debridement. Severity of Tissue Post Debridement is: Fat layer exposed. Post procedure Diagnosis Wound #6: Same as Pre-Procedure General Notes: Scribed for Dr. Celine Ahr by Blanche East, RN. Plan Follow-up Appointments: Return Appointment in 1 week. - Dr.  Celine Ahr - room 4 Wednesday 01/14/22 at 1:00 PM Anesthetic: Wound #3 Right,Anterior Lower Leg: (In clinic) Topical Lidocaine 5% applied to wound bed Bathing/ Shower/ Hygiene: May shower with protection but do not get wound dressing(s) wet. - purchase cast protector from [facility], [facility] or [employer] Edema Control - Lymphedema / SCD / Other: Elevate legs to the level of the heart or above for 30 minutes daily and/or when sitting, a frequency of: Avoid standing for long periods of time. WOUND #3: - Lower Leg Wound Laterality: Right, Anterior Peri-Wound Care: Sween Lotion (Moisturizing lotion) Discharge Instructions: Apply moisturizing lotion as directed Prim Dressing: Promogran  Prisma Matrix, 4.34 (sq in) (silver collagen) ary Discharge Instructions: Moisten collagen with saline or hydrogel Secondary Dressing: ABD Pad, 8x10 Discharge Instructions: Apply over primary dressing as directed. Secondary Dressing: Woven Gauze Sponge, Non-Sterile 4x4 in Discharge Instructions: Apply over primary dressing as directed. Secured With: Coban Self-Adherent Wrap 4x5 (in/yd) Discharge Instructions: Secure with Coban as directed. Secured With: Stretch Net Size 5, 10 (yds) 01/14/2022: Both of the upper leg wounds have a light layer of slough on the surface. The more distal wound is a little bit dry. The anterior tibial wound continues to accumulate a fair amount of slough. Edema control is good. I used a curette to debride slough from all of the wounds. We will use Prisma silver collagen on all sites, moistening with hydrogel to maintain a good moisture balance. Continue compressive wrapping. We will use the first layer of an Unna boot to try and minimize his discomfort and slippage of the wrap. Follow-up in 1 week. Electronic Signature(s) Signed: 01/14/2022 1:41:37 PM By: [name] Entered By: Fredirick Maudlin on 01/14/2022 13:41:36 Michael [name], Michael [name] ([id number]) [id number] Page 10 of 12 -------------------------------------------------------------------------------- HxROS Details Patient Name: Date of Service: Michael [name], [name] 01/14/2022 1:00 PM Medical Record Number: [id number] Patient Account Number: [id number] Date of Birth/Sex: Treating RN: 02-06-1951 (71 y.o. M) Primary Care Provider: [name] Other Clinician: Referring Provider: Treating Provider/Extender: [name] in Treatment: 5 Information Obtained From Patient Constitutional Symptoms (General Health) Medical History: Past Medical History Notes: obesity , h/o (R) leg claudication (right fem stent September  2012) Ear/Nose/Mouth/Throat Medical History: Positive for: Chronic sinus problems/congestion - seasonal allergies Cardiovascular Medical History: Positive for: Hypertension; Peripheral Arterial Disease - STENTS; Peripheral Venous Disease Past Medical History Notes: hyperlipidemia Endocrine Medical History: Positive for: Type II Diabetes - last A1c- 8.2 Past Medical History Notes: pt. on diet intentionally losing 20 pounds since December 2016 in an effort to improve A1C levels Time with diabetes: 9 YRS Treated with: Insulin Blood sugar tested every day: Yes Tested : 7-8 TIMES Integumentary (Skin) Medical History: Past Medical History Notes: psoriatic arthritis Musculoskeletal Medical History: Past Medical History Notes: bilat knee pain identified as an ortho issue psoriatic arthritis Oncologic Medical History: Past Medical History Notes: Prostate cancer 2018 Psychiatric Medical History: Past Medical History Notes: anxiety HBO Extended History Items Ear/Nose/Mouth/Throat: Chronic sinus problems/congestion Michael [name], Michael [name] ([id number]) [id number] Page 11 of 12 Immunizations Pneumococcal Vaccine: Received Pneumococcal Vaccination: Yes Received Pneumococcal Vaccination On or After 60th Birthday: Yes Implantable Devices None Hospitalization / Surgery History Type of Hospitalization/Surgery left shoulder surgery Family and Social History Cancer: Yes - Siblings; Heart Disease: Yes - Father; Hereditary Spherocytosis: No; Hypertension: Yes - Father,Mother; Kidney Disease: No; Seizures: No; Stroke: No; Thyroid Problems: No; Tuberculosis: No; Former smoker - smokeless tobacco; Marital Status - Married; Alcohol Use: Rarely - WINE; Drug Use:  No History; Caffeine Use: Daily - COFFEE; Financial Concerns: No; Food, Clothing or Shelter Needs: No; Support System Lacking: No; Transportation Concerns: No Electronic Signature(s) Signed: 01/14/2022 5:18:05  PM By: [name] Entered By: Fredirick Maudlin on 01/14/2022 13:39:17 -------------------------------------------------------------------------------- SuperBill Details Patient Name: Date of Service: Michael Spickard, [place] [name]. 01/14/2022 Medical Record Number: [id number] Patient Account Number: [id number] Date of Birth/Sex: Treating RN: [birth year] (71 y.o. 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 E11.622 Type 2 diabetes mellitus with other skin ulcer I73.9 Peripheral vascular disease, unspecified L97.122 Non-pressure chronic ulcer of left thigh with fat layer exposed Facility Procedures : CPT4 Code Description: [id number] - DEBRIDE WOUND 1ST 20 SQ CM OR < ICD-10 Diagnosis Description L97.815 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 involvemen L97.122 Non-pressure chronic ulcer of left thigh with fat  layer exposed Modifier: t without evidence Quantity: 1 of necrosis : CPT4 Code Description: [id number] - DEBRIDE WOUND EA ADDL 20 SQ CM ICD-10 Diagnosis Description L97.815 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 involvemen L97.122 Non-pressure chronic ulcer of left thigh with fat layer  exposed Modifier: t without evidence Quantity: 1 of necrosis Physician Procedures : CPT4 Code Description Modifier [id number] - WC PHYS LEVEL 3 - EST PT 25 ICD-10 Diagnosis Description L97.815 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 without evidence o L97.122 Non-pressure chronic ulcer of  left thigh with  fat layer exposed I70.239 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 E11.622 Type 2 diabetes mellitus with other skin ulcer Michael [name], Michael [name] ([id number]) [id number] Quantity: 1 f necrosis Page 12 of 12 : [id number] - WC PHYS DEBR WO ANESTH 20 SQ CM 1 ICD-10 Diagnosis Description L97.815 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

## 2022-01-16 ENCOUNTER — Other Ambulatory Visit: Payer: Self-pay

## 2022-01-16 DIAGNOSIS — I739 Peripheral vascular disease, unspecified: Secondary | ICD-10-CM

## 2022-01-16 DIAGNOSIS — I70738 Atherosclerosis of other type of bypass graft(s) of the right leg with ulceration of other part of lower leg: Secondary | ICD-10-CM

## 2022-01-19 ENCOUNTER — Encounter (HOSPITAL_BASED_OUTPATIENT_CLINIC_OR_DEPARTMENT_OTHER): Payer: Medicare Other | Admitting: General Surgery

## 2022-01-19 DIAGNOSIS — I70239 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: Secondary | ICD-10-CM | POA: Diagnosis not present

## 2022-01-19 DIAGNOSIS — I129 Hypertensive chronic kidney disease with stage 1 through stage 4 chronic kidney disease, or unspecified chronic kidney disease: Secondary | ICD-10-CM | POA: Diagnosis not present

## 2022-01-19 DIAGNOSIS — L97815 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: Secondary | ICD-10-CM | POA: Diagnosis not present

## 2022-01-19 DIAGNOSIS — N189 Chronic kidney disease, unspecified: Secondary | ICD-10-CM | POA: Diagnosis not present

## 2022-01-19 DIAGNOSIS — E11622 Type 2 diabetes mellitus with other skin ulcer: Secondary | ICD-10-CM | POA: Diagnosis not present

## 2022-01-19 DIAGNOSIS — E1122 Type 2 diabetes mellitus with diabetic chronic kidney disease: Secondary | ICD-10-CM | POA: Diagnosis not present

## 2022-01-19 DIAGNOSIS — E1151 Type 2 diabetes mellitus with diabetic peripheral angiopathy without gangrene: Secondary | ICD-10-CM | POA: Diagnosis not present

## 2022-01-19 DIAGNOSIS — L97122 Non-pressure chronic ulcer of left thigh with fat layer exposed: Secondary | ICD-10-CM | POA: Diagnosis not present

## 2022-01-19 NOTE — Progress Notes (Signed)
ALVAR, MALINOSKI ([id number]) [id number] Page 1 of 11 Visit Report for 01/19/2022 Arrival Information Details Patient Name: Date of Service: HO [place], [name] 01/19/2022 1:00 PM Medical Record Number: [id number] Patient Account Number: [id number] Date of Birth/Sex: Treating RN: May 23, 1950 (71 y.o. [name] Primary Care Heydi Swango: [name] Other Clinician: Referring Janeann Paisley: Treating Geffrey Michaelsen/Extender: [name] in Treatment: 6 Visit Information History Since Last Visit Added or deleted any medications: No Patient Arrived: Walker Any new allergies or adverse reactions: No Arrival Time: 13:10 Had a fall or experienced change in No Accompanied By: wife activities of daily living that may affect Transfer Assistance: None risk of falls: Patient Requires Transmission-Based Precautions: No Signs or symptoms of abuse/neglect since last visito No Patient Has Alerts: Yes Hospitalized since last visit: No Patient Alerts: Patient on Blood Thinner Implantable device outside of the clinic excluding No plavix cellular tissue based products placed in the center since last visit: Has Compression in Place as Prescribed: Yes Pain Present Now: Yes Electronic Signature(s) Signed: 01/19/2022 4:43:27 PM By: [name] Entered By: Blanche East on 01/19/2022 13:11:02 -------------------------------------------------------------------------------- Encounter Discharge Information Details Patient Name: Date of Service: HO [place], [place] C. 01/19/2022 1:00 PM Medical Record Number: [id number] Patient Account Number: [id number] Date of Birth/Sex: Treating RN: [birth year] (71 y.o. [name] Primary Care Powell Halbert: [name] Other Clinician: Referring Arshan Jabs: Treating Treasure Ingrum/Extender: [name] in Treatment: 6 Encounter Discharge Information Items Post Procedure Vitals Discharge  Condition: Stable Temperature (F): 98.3 Ambulatory Status: Ambulatory Pulse (bpm): 111 Discharge Destination: Home Respiratory Rate (breaths/min): 18 Transportation: Private Auto Blood Pressure (mmHg): 127/78 Accompanied By: self Schedule Follow-up Appointment: Yes Clinical Summary of Care: Electronic Signature(s) Signed: 01/19/2022 4:43:27 PM By: [name] Entered By: Blanche East on 01/19/2022 14:10:59 [name] ([id number]) [id number] Page 2 of 11 -------------------------------------------------------------------------------- Lower Extremity Assessment Details Patient Name: Date of Service: [name], [name] 01/19/2022 1:00 PM Medical Record Number: [id number] Patient Account Number: [id number] Date of Birth/Sex: Treating RN: [birth year] (71 y.o. [name] Primary Care Chaunte Hornbeck: [name] Other Clinician: Referring Slade Pierpoint: Treating Abbiegail Landgren/Extender: [name] in Treatment: 6 Edema Assessment Assessed: Shirlyn Goltz: No] [Right: No] Edema: [Left: Ye] [Right: s] Calf Left: Right: Point of Measurement: 35 cm From Medial Instep 35 cm Ankle Left: Right: Point of Measurement: 12 cm From Medial Instep 21 cm Vascular Assessment Pulses: Dorsalis Pedis Palpable: [Right:Yes] Electronic Signature(s) Signed: 01/19/2022 4:43:27 PM By: [name] Entered By: Blanche East on 01/19/2022 13:12:35 -------------------------------------------------------------------------------- Multi Wound Chart Details Patient Name: Date of Service: HO [place], [place] C. 01/19/2022 1:00 PM Medical Record Number: [id number] Patient Account Number: [id number] Date of Birth/Sex: Treating RN: [birth year] (71 y.o. M) Primary Care Aliceson Dolbow: [name] Other Clinician: Referring Najia Hurlbutt: Treating Dago Jungwirth/Extender: [name] in Treatment: 6 Vital Signs Height(in): 72 Pulse(bpm):  111 Weight(lbs): 208 Blood Pressure(mmHg): 127/78 Body Mass Index(BMI): 28.2 Temperature(F): 98.3 Respiratory Rate(breaths/min): 18 [3:Photos:] [[id number] Page 3 of 11] Right, Anterior Lower Leg Right, Medial Upper Leg Right Groin Wound Location: Blister Surgical Injury Surgical Injury Wounding Event: Diabetic Wound/Ulcer of the Lower Diabetic Wound/Ulcer of the Lower Diabetic Wound/Ulcer of the Lower Primary Etiology: Extremity Extremity Extremity Chronic sinus problems/congestion, Chronic sinus problems/congestion, Chronic sinus problems/congestion, Comorbid History: Hypertension, Peripheral Arterial Hypertension, Peripheral Arterial Hypertension, Peripheral Arterial Disease, Peripheral Venous Disease, Disease, Peripheral Venous Disease, Disease, Peripheral Venous Disease, Type II Diabetes Type II Diabetes  Type II Diabetes 09/30/2021 11/18/2021 11/18/2021 Date Acquired: 6 0 0 Weeks of Treatment: Open Open Open Wound Status: No No No Wound Recurrence: 5.3x4x0.2 0.4x0.9x0.1 0.8x0.3x0.1 Measurements L x W x D (cm) 16.65 0.283 0.188 A (cm) : rea 3.33 0.028 0.019 Volume (cm) : 5.20% 81.80% 52.20% % Reduction in A rea: 36.80% 82.10% 51.30% % Reduction in Volume: Grade 2 Grade 1 Grade 1 Classification: Medium Medium Medium Exudate A mount: Serosanguineous Serous Serous Exudate Type: red, brown amber amber Exudate Color: Distinct, outline attached N/A N/A Wound Margin: Small (1-33%) Medium (34-66%) Medium (34-66%) Granulation A mount: Red, Pink N/A Red Granulation Quality: Large (67-100%) Medium (34-66%) Medium (34-66%) Necrotic A mount: Fat Layer (Subcutaneous Tissue): Yes Fat Layer (Subcutaneous Tissue): Yes Fat Layer (Subcutaneous Tissue): Yes Exposed Structures: Tendon: Yes Fascia: No Fascia: No Fascia: No Tendon: No Tendon: No Muscle: No Muscle: No Muscle: No Joint: No Joint: No Joint: No Bone: No Bone: No Small  (1-33%) N/A Small (1-33%) Epithelialization: N/A Debridement - Selective/Open Wound Debridement - Selective/Open Wound Debridement: Pre-procedure Verification/Time Out N/A 13:33 13:33 Taken: N/A [facility] Tissue Debrided: N/A Non-Viable Tissue Non-Viable Tissue Level: N/A 0.36 0.24 Debridement A (sq cm): rea N/A Curette Curette Instrument: N/A Minimum Minimum Bleeding: N/A Pressure Pressure Hemostasis A chieved: N/A Procedure was tolerated well Procedure was tolerated well Debridement Treatment Response: N/A 0.4x0.9x0.1 0.8x0.3x0.1 Post Debridement Measurements L x W x D (cm) N/A 0.028 0.019 Post Debridement Volume: (cm) No Abnormalities Noted Periwound Skin Texture: No Abnormalities Noted Dry/Scaly: Yes Periwound Skin Moisture: Atrophie Blanche: No Periwound Skin Color: Cyanosis: No Erythema: No No Abnormality N/A N/A Temperature: Yes N/A N/A Tenderness on Palpation: N/A Debridement Debridement Procedures Performed: Treatment Notes Wound #3 (Lower Leg) Wound Laterality: Right, Anterior Cleanser Peri-Wound Care Sween Lotion (Moisturizing lotion) Discharge Instruction: Apply moisturizing lotion as directed Topical Primary Dressing Promogran Prisma Matrix, 4.34 (sq in) (silver collagen) Discharge Instruction: Moisten collagen with saline or hydrogel Secondary Dressing ABD Pad, 8x10 Discharge Instruction: Apply over primary dressing as directed. Woven Gauze Sponge, Non-Sterile 4x4 in Discharge Instruction: Apply over primary dressing as directed. Secured With [employer], 4.5x3.1 (in/yd) Discharge Instruction: Secure with Kerlix as directed. Stretch Net Size 5, 10 (yds) Camps, Ray C ([id number]) [id number] Page 4 of 11 Compression Wrap Unnaboot w/Calamine, 4x10 (in/yd) Discharge Instruction: Apply Unnaboot to top of dressing and around ankles to prevent dressing from sliding Kerlix Roll 4.5x3.1 (in/yd) Discharge  Instruction: Apply Kerlix and Coban compression as directed. Coban Self-Adherent Wrap 4x5 (in/yd) Discharge Instruction: Apply over Kerlix as directed. Compression Stockings Add-Ons Wound #5 (Upper Leg) Wound Laterality: Right, Medial Cleanser Soap and Water Discharge Instruction: May shower and wash wound with dial antibacterial soap and water prior to dressing change. Peri-Wound Care Topical Primary Dressing Promogran Prisma Matrix, 4.34 (sq in) (silver collagen) Discharge Instruction: Moisten collagen with saline or hydrogel Secondary Dressing ALLEVYN Gentle Border, 4x4 (in/in) Discharge Instruction: Apply over primary dressing as directed. Secured With Compression Wrap Compression Stockings Add-Ons Wound #6 (Groin) Wound Laterality: Right Cleanser Soap and Water Discharge Instruction: May shower and wash wound with dial antibacterial soap and water prior to dressing change. Peri-Wound Care Topical Primary Dressing Promogran Prisma Matrix, 4.34 (sq in) (silver collagen) Discharge Instruction: Moisten collagen with saline or hydrogel Secondary Dressing ALLEVYN Gentle Border, 4x4 (in/in) Discharge Instruction: Apply over primary dressing as directed. Secured With Compression Wrap Compression Stockings Environmental education officer) Signed: 01/19/2022 2:36:03 PM By: [name] Entered By: Fredirick Maudlin on 01/19/2022 14:36:03  DEL, OVERFELT ([id number]) [id number] Page 5 of 11 -------------------------------------------------------------------------------- Multi-Disciplinary Care Plan Details Patient Name: Date of Service: HO [place], [name] 01/19/2022 1:00 PM Medical Record Number: [id number] Patient Account Number: [id number] Date of Birth/Sex: Treating RN: 22-Dec-1950 (71 y.o. [name] Primary Care Mavrik Bynum: [name] Other Clinician: Referring Madgeline Rayo: Treating Berdene Askari/Extender: [name] in Treatment: 6 Active Inactive Nutrition Nursing Diagnoses: Potential for alteratiion in Nutrition/Potential for imbalanced nutrition Goals: Patient/caregiver agrees to and verbalizes understanding of need to use nutritional supplements and/or vitamins as prescribed Date Initiated: 12/04/2021 Target Resolution Date: 01/28/2022 Goal Status: Active Patient/caregiver verbalizes understanding of need to maintain therapeutic glucose control per primary care physician Date Initiated: 12/04/2021 Target Resolution Date: 01/28/2022 Goal Status: Active Interventions: Assess patient nutrition upon admission and as needed per policy Provide education on elevated blood sugars and impact on wound healing Provide education on nutrition Treatment Activities: Education provided on Nutrition : 01/14/2022 Notes: Wound/Skin Impairment Nursing Diagnoses: Impaired tissue integrity Knowledge deficit related to ulceration/compromised skin integrity Goals: Patient/caregiver will verbalize understanding of skin care regimen Date Initiated: 12/04/2021 Target Resolution Date: 01/28/2022 Goal Status: Active Ulcer/skin breakdown will have a volume reduction of 30% by week 4 Date Initiated: 12/04/2021 Date Inactivated: 01/07/2022 Target Resolution Date: 12/26/2021 Goal Status: Met Ulcer/skin breakdown will have a volume reduction of 50% by week 8 Date Initiated: 01/07/2022 Target Resolution Date: 01/28/2022 Goal Status: Active Interventions: Assess ulceration(s) every visit Provide education on ulcer and skin care Notes: Electronic Signature(s) Signed: 01/19/2022 4:43:27 PM By: [name] Entered By: Blanche East on 01/19/2022 13:17:45 [name] ([id number]) [id number] Page 6 of 11 -------------------------------------------------------------------------------- Pain Assessment Details Patient Name: Date of Service: [name], [place] C. 01/19/2022 1:00 PM Medical Record Number: [id number] Patient Account Number: [id number] Date of Birth/Sex: Treating RN: 12/04/1950 (71 y.o. [name] Primary Care Ioanna Colquhoun: [name] Other Clinician: Referring Tima Curet: Treating Vic Esco/Extender: [name] in Treatment: 6 Active Problems Location of Pain Severity and Description of Pain Patient Has Paino Yes Site Locations Rate the pain. Current Pain Level: 10 Pain Management and Medication Current Pain Management: Electronic Signature(s) Signed: 01/19/2022 4:43:27 PM By: [name] Entered By: Blanche East on 01/19/2022 13:11:37 -------------------------------------------------------------------------------- Patient/Caregiver Education Details Patient Name: Date of Service: [name], [place] 11/20/2023andnbsp1:00 PM Medical Record Number: [id number] Patient Account Number: [id number] Date of Birth/Gender: Treating RN: 17-Sep-1950 (71 y.o. [name] Primary Care Physician: [name] Other Clinician: Referring Physician: Treating Physician/Extender: [name] in Treatment: 6 Education Assessment Education Provided To: Patient Education Topics Provided Nutrition: Methods: Explain/Verbal Responses: Reinforcements needed, State content correctly BRAYEN, BUNN ([id number]) [id number] Page 7 of 11 Wound/Skin Impairment: Methods: Explain/Verbal Responses: Reinforcements needed, State content correctly Electronic Signature(s) Signed: 01/19/2022 4:43:27 PM By: [name] Entered By: Blanche East on 01/19/2022 13:18:08 -------------------------------------------------------------------------------- Wound Assessment Details Patient Name: Date of Service: HO [place], [place] C. 01/19/2022 1:00 PM Medical Record Number: [id number] Patient Account Number: [id number] Date of Birth/Sex: Treating  RN: Aug 03, 1950 (71 y.o. [name] Primary Care Osha Errico: [name] Other Clinician: Referring Halaina Vanduzer: Treating Kira Hartl/Extender: [name] in Treatment: 6 Wound Status Wound Number: 3 Primary Diabetic Wound/Ulcer of the Lower Extremity Etiology: Wound Location: Right, Anterior Lower Leg Wound Open Wounding Event: Blister Status: Date Acquired: 09/30/2021 Comorbid Chronic sinus problems/congestion, Hypertension, Peripheral Weeks Of Treatment: 6 History: Arterial Disease, Peripheral Venous Disease, Type  II Diabetes Clustered Wound: No Photos Wound Measurements Length: (cm) 5.3 Width: (cm) 4 Depth: (cm) 0.2 Area: (cm) 16.65 Volume: (cm) 3.33 % Reduction in Area: 5.2% % Reduction in Volume: 36.8% Epithelialization: Small (1-33%) Tunneling: No Undermining: No Wound Description Classification: Grade 2 Wound Margin: Distinct, outline attached Exudate Amount: Medium Exudate Type: Serosanguineous Exudate Color: red, brown Foul Odor After Cleansing: No Slough/Fibrino Yes Wound Bed Granulation Amount: Small (1-33%) Exposed Structure Granulation Quality: Red, Pink Fascia Exposed: No Necrotic Amount: Large (67-100%) Fat Layer (Subcutaneous Tissue) Exposed: Yes Necrotic Quality: Adherent Slough Tendon Exposed: Yes Muscle Exposed: No Joint Exposed: No Bone Exposed: No Periwound Skin Texture Texture Color Morrow, Leor C ([id number]) [id number] Page 8 of 11 No Abnormalities Noted: Yes No Abnormalities Noted: Yes Moisture Temperature / Pain No Abnormalities Noted: Yes Temperature: No Abnormality Tenderness on Palpation: Yes Treatment Notes Wound #3 (Lower Leg) Wound Laterality: Right, Anterior Cleanser Peri-Wound Care Sween Lotion (Moisturizing lotion) Discharge Instruction: Apply moisturizing lotion as directed Topical Primary Dressing Promogran Prisma Matrix, 4.34 (sq in) (silver  collagen) Discharge Instruction: Moisten collagen with saline or hydrogel Secondary Dressing ABD Pad, 8x10 Discharge Instruction: Apply over primary dressing as directed. Woven Gauze Sponge, Non-Sterile 4x4 in Discharge Instruction: Apply over primary dressing as directed. Secured With [employer], 4.5x3.1 (in/yd) Discharge Instruction: Secure with Kerlix as directed. Stretch Net Size 5, 10 (yds) Compression Wrap Unnaboot w/Calamine, 4x10 (in/yd) Discharge Instruction: Apply Unnaboot to top of dressing and around ankles to prevent dressing from sliding Kerlix Roll 4.5x3.1 (in/yd) Discharge Instruction: Apply Kerlix and Coban compression as directed. Coban Self-Adherent Wrap 4x5 (in/yd) Discharge Instruction: Apply over Kerlix as directed. Compression Stockings Add-Ons Electronic Signature(s) Signed: 01/19/2022 4:43:27 PM By: [name] Entered By: Blanche East on 01/19/2022 13:16:15 -------------------------------------------------------------------------------- Wound Assessment Details Patient Name: Date of Service: HO [place], [place] C. 01/19/2022 1:00 PM Medical Record Number: [id number] Patient Account Number: [id number] Date of Birth/Sex: Treating RN: [birth year] (71 y.o. [name] Primary Care Dareon Nunziato: [name] Other Clinician: Referring Davyn Elsasser: Treating Madeeha Costantino/Extender: [name] in Treatment: 6 Wound Status Wound Number: 5 Primary Diabetic Wound/Ulcer of the Lower Extremity Etiology: Wound Location: Right, Medial Upper Leg Wound Open Wounding Event: Surgical Injury Status: Date Acquired: 11/18/2021 Comorbid Chronic sinus problems/congestion, Hypertension, Peripheral Weeks Of Treatment: 0 History: Arterial Disease, Peripheral Venous Disease, Type II Diabetes Clustered Wound: No Photos KORBEN, CARCIONE ([id number]) [id number] Page 9 of 11 Wound Measurements Length: (cm)  0.4 Width: (cm) 0.9 Depth: (cm) 0.1 Area: (cm) 0.283 Volume: (cm) 0.028 % Reduction in Area: 81.8% % Reduction in Volume: 82.1% Tunneling: No Undermining: No Wound Description Classification: Grade 1 Exudate Amount: Medium Exudate Type: Serous Exudate Color: amber Foul Odor After Cleansing: No Slough/Fibrino Yes Wound Bed Granulation Amount: Medium (34-66%) Exposed Structure Necrotic Amount: Medium (34-66%) Fascia Exposed: No Fat Layer (Subcutaneous Tissue) Exposed: Yes Tendon Exposed: No Muscle Exposed: No Joint Exposed: No Bone Exposed: No Periwound Skin Texture Texture Color No Abnormalities Noted: No No Abnormalities Noted: No Moisture No Abnormalities Noted: No Treatment Notes Wound #5 (Upper Leg) Wound Laterality: Right, Medial Cleanser Soap and Water Discharge Instruction: May shower and wash wound with dial antibacterial soap and water prior to dressing change. Peri-Wound Care Topical Primary Dressing Promogran Prisma Matrix, 4.34 (sq in) (silver collagen) Discharge Instruction: Moisten collagen with saline or hydrogel Secondary Dressing ALLEVYN Gentle Border, 4x4 (in/in) Discharge Instruction: Apply over primary dressing as directed. Secured With Compression Wrap Compression Stockings Environmental education officer)  Signed: 01/19/2022 4:43:27 PM By: [name] Entered By: Blanche East on 01/19/2022 13:16:43 [name] ([id number]) [id number] Page 10 of 11 -------------------------------------------------------------------------------- Wound Assessment Details Patient Name: Date of Service: HO FFMA [place], [name] 01/19/2022 1:00 PM Medical Record Number: [id number] Patient Account Number: [id number] Date of Birth/Sex: Treating RN: 1950-09-09 (71 y.o. [name] Primary Care Liann Spaeth: [name] Other Clinician: Referring Sebron Mcmahill: Treating Michol Emory/Extender: [name] in  Treatment: 6 Wound Status Wound Number: 6 Primary Diabetic Wound/Ulcer of the Lower Extremity Etiology: Wound Location: Right Groin Wound Open Wounding Event: Surgical Injury Status: Date Acquired: 11/18/2021 Comorbid Chronic sinus problems/congestion, Hypertension, Peripheral Weeks Of Treatment: 0 History: Arterial Disease, Peripheral Venous Disease, Type II Diabetes Clustered Wound: No Photos Wound Measurements Length: (cm) 0.8 Width: (cm) 0.3 Depth: (cm) 0.1 Area: (cm) 0.188 Volume: (cm) 0.019 % Reduction in Area: 52.2% % Reduction in Volume: 51.3% Epithelialization: Small (1-33%) Tunneling: No Undermining: No Wound Description Classification: Grade 1 Exudate Amount: Medium Exudate Type: Serous Exudate Color: amber Foul Odor After Cleansing: No Slough/Fibrino Yes Wound Bed Granulation Amount: Medium (34-66%) Exposed Structure Granulation Quality: Red Fascia Exposed: No Necrotic Amount: Medium (34-66%) Fat Layer (Subcutaneous Tissue) Exposed: Yes Necrotic Quality: Adherent Slough Tendon Exposed: No Muscle Exposed: No Joint Exposed: No Periwound Skin Texture Texture Color No Abnormalities Noted: No No Abnormalities Noted: No Moisture No Abnormalities Noted: No Dry / Scaly: Yes Treatment Notes Wound #6 (Groin) Wound Laterality: Right Cleanser Soap and Water Discharge Instruction: May shower and wash wound with dial antibacterial soap and water prior to dressing change. SAIQUAN, HANDS ([id number]) [id number] Page 11 of 11 Peri-Wound Care Topical Primary Dressing Promogran Prisma Matrix, 4.34 (sq in) (silver collagen) Discharge Instruction: Moisten collagen with saline or hydrogel Secondary Dressing ALLEVYN Gentle Border, 4x4 (in/in) Discharge Instruction: Apply over primary dressing as directed. Secured With Compression Wrap Compression Stockings Environmental education officer) Signed: 01/19/2022 4:43:27 PM By: [name] Entered By: Blanche East on 01/19/2022 13:17:13 -------------------------------------------------------------------------------- Vitals Details Patient Name: Date of Service: HO [place], [place] C. 01/19/2022 1:00 PM Medical Record Number: [id number] Patient Account Number: [id number] Date of Birth/Sex: Treating RN: [birth year] (71 y.o. [name] Primary Care Claudia Alvizo: [name] Other Clinician: Referring Joliet Mallozzi: Treating Abe Schools/Extender: [name] in Treatment: 6 Vital Signs Time Taken: 13:11 Temperature (F): 98.3 Height (in): 72 Pulse (bpm): 111 Weight (lbs): 208 Respiratory Rate (breaths/min): 18 Body Mass Index (BMI): 28.2 Blood Pressure (mmHg): 127/78 Reference Range: 80 - 120 mg / dl Electronic Signature(s) Signed: 01/19/2022 4:43:27 PM By: [name] Entered By: Blanche East on 01/19/2022 13:11:22

## 2022-01-19 NOTE — Progress Notes (Signed)
ALGIE, WESTRY ([id number]) [id number] Page 1 of 11 Visit Report for 01/19/2022 Chief Complaint Document Details Patient Name: Date of Service: Michael [name], Michael C. 01/19/2022 1:00 PM Medical Record Number: [id number] Patient Account Number: [id number] Date of Birth/Sex: Treating RN: 1950/09/20 (71 y.o. M) Primary Care Provider: [name] Other Clinician: Referring Provider: Treating Provider/Extender: [name] in Treatment: 6 Information Obtained from: Patient Chief Complaint Patient presents to the wound care center today with an open arterial ulcer to the right lower extremity in the setting of diabetes mellitus. he has had this problem for 7 months 12/04/2021: ulcer to right lower anterior tibial surface Electronic Signature(s) Signed: 01/19/2022 2:36:13 PM By: [name] Entered By: Fredirick Maudlin on 01/19/2022 14:36:13 -------------------------------------------------------------------------------- Debridement Details Patient Name: Date of Service: Michael Marble [name], Michael C. 01/19/2022 1:00 PM Medical Record Number: [id number] Patient Account Number: [id number] Date of Birth/Sex: Treating RN: [birth year] (71 y.o. [name] Primary Care Provider: [name] Other Clinician: Referring Provider: Treating Provider/Extender: [name] in Treatment: 6 Debridement Performed for Assessment: Wound #6 Right Groin Performed By: Physician Fredirick Maudlin, MD Debridement Type: Debridement Severity of Tissue Pre Debridement: Fat layer exposed Level of Consciousness (Pre-procedure): Awake and Alert Pre-procedure Verification/Time Out Yes - 13:33 Taken: Start Time: 13:34 T Area Debrided (L x W): otal 0.8 (cm) x 0.3 (cm) = 0.24 (cm) Tissue and other material debrided: Non-Viable, Slough, Slough Level: Non-Viable Tissue Debridement Description: Selective/Open  Wound Instrument: Curette Bleeding: Minimum Hemostasis Achieved: Pressure Response to Treatment: Procedure was tolerated well Level of Consciousness (Post- Awake and Alert procedure): Post Debridement Measurements of Total Wound Length: (cm) 0.8 Width: (cm) 0.3 Depth: (cm) 0.1 Volume: (cm) 0.019 Character of Wound/Ulcer Post Debridement: Requires Further Debridement Severity of Tissue Post Debridement: Fat layer exposed XZAVIAR, MALOOF C ([id number]) [id number] Page 2 of 11 Post Procedure Diagnosis Same as Pre-procedure Notes Scribed for Dr. Celine Ahr by Blanche East, RN Electronic Signature(s) Signed: 01/19/2022 4:40:34 PM By: [name] Signed: 01/19/2022 4:43:27 PM By: [name] Entered By: Blanche East on 01/19/2022 13:40:35 -------------------------------------------------------------------------------- Debridement Details Patient Name: Date of Service: Michael [place] [name], Michael C. 01/19/2022 1:00 PM Medical Record Number: [id number] Patient Account Number: [id number] Date of Birth/Sex: Treating RN: [birth year] (72 y.o. [name] Primary Care Provider: [name] Other Clinician: Referring Provider: Treating Provider/Extender: [name] in Treatment: 6 Debridement Performed for Assessment: Wound #5 Right,Medial Upper Leg Performed By: Physician Fredirick Maudlin, MD Debridement Type: Debridement Severity of Tissue Pre Debridement: Fat layer exposed Level of Consciousness (Pre-procedure): Awake and Alert Pre-procedure Verification/Time Out Yes - 13:33 Taken: Start Time: 13:34 T Area Debrided (L x W): otal 0.4 (cm) x 0.9 (cm) = 0.36 (cm) Tissue and other material debrided: Non-Viable, Slough, Slough Level: Non-Viable Tissue Debridement Description: Selective/Open Wound Instrument: Curette Bleeding: Minimum Hemostasis Achieved: Pressure Response to Treatment: Procedure was tolerated  well Level of Consciousness (Post- Awake and Alert procedure): Post Debridement Measurements of Total Wound Length: (cm) 0.4 Width: (cm) 0.9 Depth: (cm) 0.1 Volume: (cm) 0.028 Character of Wound/Ulcer Post Debridement: Requires Further Debridement Severity of Tissue Post Debridement: Fat layer exposed Post Procedure Diagnosis Same as Pre-procedure Notes Scribed for Dr. Celine Ahr by Blanche East, RN Electronic Signature(s) Signed: 01/19/2022 4:40:34 PM By: [name] Signed: 01/19/2022 4:43:27 PM By: [name] Entered By: Blanche East on 01/19/2022 13:41:20 [name] ([id number]) [id number] Page 3 of 11 --------------------------------------------------------------------------------  HPI Details Patient Name: Date of Service: Michael Olegario [name], Michael C. 01/19/2022 1:00 PM Medical Record Number: [id number] Patient Account Number: [id number] Date of Birth/Sex: Treating RN: 1950-09-05 (71 y.o. M) Primary Care Provider: [name] Other Clinician: Referring Provider: Treating Provider/Extender: [name] in Treatment: 6 History of Present Illness Location: right lateral calf closer to the knee Quality: Patient reports experiencing a dull pain to affected area(s). Severity: Patient states wound(s) are getting worse. Duration: Patient has had the wound for > 7 months prior to seeking treatment at the wound center Timing: Pain in wound is constant (hurts all the time) Context: The wound would happen gradually Modifying Factors: Patient wound(s)/ulcer(s) are worsening due to : no resolution and a white material at the base of the wound ssociated Signs and Symptoms: Patient reports having:no discharge or purulent material A HPI Description: 70 year old gentleman who has been referred to was from his PCP for a chronic ulceration on his right lower extremity which she's had for several weeks. Past medical  history significant for diabetes mellitus type 2, hypertension, hyperlipidemia, anxiety, obesity, peripheral vascular disease and chronic kidney disease. He has never been a smoker. Most recent lab work done at his PCPs office showed a glucose of 217 milligrams per deciliter which is consistent with hyperglycemia. In January 2017 recent hemoglobin A1c values noted to be 8.2%. In April 2017, he has been seen at the vascular office by Dr. Trula Slade and Dr. Kellie Simmering for right leg claudication. He had a right superficial femoral artery stent in September 2012. Recent noninvasive vascular imaging done on 06/24/2015 showed a right ABI of 1.07 with a triphasic waveform and a right TBI of 0.81. The left was noncompressible with a triphasic waveform and a TBI of 1.17. Right lower extremity arterial duplex was unable to identify stent exit but they were elevated velocities at the stent and in the distal thigh with triphasic waveforms. Dr. Stephens Shire opinion was to return to the clinic in 6 months with ABI and right lower extremity arterial duplex studies to be done. He has seen a dermatologist who had done a biopsy and said it was benign and he was given a steroid cream. 10/23/2015 -- Pathology report of the biopsy done in April 2017 shows ulcer with underlying angiodermatitis consistent with stasis dermatitis. 10/30/2015 -- he is going for shoulder surgery in about 10 days' time and was asking about the perioperative care. His blood sugars are running in the high 100s or low 200s. No hemoglobin A1c done recently. 11/20/2015 -- is back up to 2 weeks because of recent left shoulder surgery. 12/04/15; patient returns today with the wound for the most part looking healthy. No evidence of infection no debridement required. He is using Prisma for 4 weeks, without obvious improvement per her intake nurse. This started as several small open areas that were raised erythematous. He saw dermatology and at some point this was  biopsied that just suggested stasis skin physiology. This certainly doesn't look like that 12/11/15; using Hydrafera Blue. Previous biopsy reviewed, no atypia PAS negative. He has a history of stents in the right leg however he does not appear to have primary arterial insufficiency ABI in this clinic was over 0.9. 12/25/2015 -- he has been approved for grafix and we will order for some to be applied next week 01/01/2016 -- he has had his first application of Grafix today 01/08/2016 -- he has had his second application of Grafix today READMISSION 12/04/2021 This is a  now 71 year old man who was followed in our clinic about 5 years ago for an ulceration on his right lateral lower leg, just distal to the knee. He has since undergone a number of revascularization procedures that have been complicated by restenosis and wound infections. During the course of this treatment, he developed a small ulcer on his right anterior tibial surface. Despite use of an Unna boot, the wound has continued to expand. He was referred to the wound care center by Dr. Trula Slade for further evaluation and management. On the right anterior tibial surface, there is an irregular wound with heavy slough and eschar accumulation. The periwound skin is intact but he does have 1-2+ pitting edema. There is no purulent drainage or malodor. 10/13; second visit for this man who has an ischemic wound in the setting of type 2 diabetes on the right anterior mid tibia area. He has been revascularized. Using Santyl gent 12/22/2021: The wound is about the same size this week. There is a lot of gray sloughy material on the surface, secondary to silver nitrate used to stop bleeding after what sounds like a fairly aggressive debridement last week. 12/30/2021: The wound is smaller this week and a bit cleaner. Edema control is excellent. There is still a bit of slough accumulation. 01/07/2022: The wound continues to contract and is much cleaner this  week. Edema control is very good. He has small openings in his upper leg surgical scars, but he is going to be seeing Dr. Trula Slade next Monday and will have him take a look. He has been applying manuka honey to both of the sites. We have been using Santyl and gentamicin on his lower leg wound. 01/14/2022: He saw vascular surgery this week and was told that they were happy to have [place] manage the 2 wounds from his operation. Both of these have a light layer of slough on the surface. The more distal wound is a little bit dry. The anterior tibial wound continues to accumulate a fair amount of slough. Edema control is good. 01/19/2022: Both upper leg wounds are smaller. The more distal is quite dry and cracked when I was examining it. The lower leg wound is more painful and the surrounding tissue is erythematous and indurated. Electronic Signature(s) Signed: 01/19/2022 2:37:08 PM By: [name] Entered By: Fredirick Maudlin on 01/19/2022 14:37:08 [name] ([id number]) [id number] Page 4 of 11 -------------------------------------------------------------------------------- Physical Exam Details Patient Name: Date of Service: Michael Olegario [name], Michael C. 01/19/2022 1:00 PM Medical Record Number: [id number] Patient Account Number: [id number] Date of Birth/Sex: Treating RN: 12-25-1950 (71 y.o. M) Primary Care Provider: [name] Other Clinician: Referring Provider: Treating Provider/Extender: [name] in Treatment: 6 Constitutional .Tachycardic, asymptomatic.. . . No acute distress. Respiratory Normal work of breathing on room air. Notes 01/19/2022: Both upper leg wounds are smaller. The more distal is quite dry and cracked when I was examining it. The lower leg wound is more painful and the surrounding tissue is erythematous and indurated. Electronic Signature(s) Signed: 01/19/2022 2:37:49 PM By: Fredirick Maudlin MD  FACS Entered By: Fredirick Maudlin on 01/19/2022 14:37:48 -------------------------------------------------------------------------------- Physician Orders Details Patient Name: Date of Service: Michael [place], [place] C. 01/19/2022 1:00 PM Medical Record Number: [id number] Patient Account Number: [id number] Date of Birth/Sex: Treating RN: [birth year] (71 y.o. [name] Primary Care Provider: [name] Other Clinician: Referring Provider: Treating Provider/Extender: [name] in Treatment: 6 Verbal / Phone Orders: No Diagnosis Coding  ICD-10 Coding Code Description L97.815 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 I70.239 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 E11.622 Type 2 diabetes mellitus with other skin ulcer I73.9 Peripheral vascular disease, unspecified L97.122 Non-pressure chronic ulcer of left thigh with fat layer exposed Follow-up Appointments ppointment in 1 week. - Dr. Celine Ahr - room 4 Return A Anesthetic Wound #3 Right,Anterior Lower Leg (In clinic) Topical Lidocaine 5% applied to wound bed Bathing/ Shower/ Hygiene May shower with protection but do not get wound dressing(s) wet. - purchase cast protector from [facility], [facility] or [employer] Edema Control - Lymphedema / SCD / Other Right Lower Extremity Elevate legs to the level of the heart or above for 30 minutes daily and/or when sitting, a frequency of: Avoid standing for long periods of time. Wound Treatment DECARLOS, EMPEY ([id number]) [id number] Page 5 of 11 Wound #3 - Lower Leg Wound Laterality: Right, Anterior Peri-Wound Care: Sween Lotion (Moisturizing lotion) Discharge Instructions: Apply moisturizing lotion as directed Prim Dressing: Promogran Prisma Matrix, 4.34 (sq in) (silver collagen) ary Discharge Instructions: Moisten collagen  with saline or hydrogel Secondary Dressing: ABD Pad, 8x10 Discharge Instructions: Apply over primary dressing as directed. Secondary Dressing: Woven Gauze Sponge, Non-Sterile 4x4 in Discharge Instructions: Apply over primary dressing as directed. Secured With: [employer], 4.5x3.1 (in/yd) Discharge Instructions: Secure with Kerlix as directed. Secured With: [employer] Size 5, 10 (yds) Compression Wrap: Unnaboot w/Calamine, 4x10 (in/yd) Discharge Instructions: Apply Unnaboot to top of dressing and around ankles to prevent dressing from sliding Compression Wrap: Kerlix Roll 4.5x3.1 (in/yd) Discharge Instructions: Apply Kerlix and Coban compression as directed. Compression Wrap: Coban Self-Adherent Wrap 4x5 (in/yd) Discharge Instructions: Apply over Kerlix as directed. Wound #5 - Upper Leg Wound Laterality: Right, Medial Cleanser: Soap and Water 1 x Per Day/30 Days Discharge Instructions: May shower and wash wound with dial antibacterial soap and water prior to dressing change. Prim Dressing: Promogran Prisma Matrix, 4.34 (sq in) (silver collagen) (Dispense As Written) 1 x Per Day/30 Days ary Discharge Instructions: Moisten collagen with saline or hydrogel Secondary Dressing: ALLEVYN Gentle Border, 4x4 (in/in) (Dispense As Written) 1 x Per Day/30 Days Discharge Instructions: Apply over primary dressing as directed. Wound #6 - Groin Wound Laterality: Right Cleanser: Soap and Water 1 x Per Day/30 Days Discharge Instructions: May shower and wash wound with dial antibacterial soap and water prior to dressing change. Prim Dressing: Promogran Prisma Matrix, 4.34 (sq in) (silver collagen) (Dispense As Written) 1 x Per Day/30 Days ary Discharge Instructions: Moisten collagen with saline or hydrogel Secondary Dressing: ALLEVYN Gentle Border, 4x4 (in/in) (Dispense As Written) 1 x Per Day/30 Days Discharge Instructions: Apply over primary dressing as directed. Laboratory erobe culture  (MICRO) - Non-healing wound to right lower leg Bacteria identified in Unspecified specimen by A LOINC Code: 938-1 Convenience Name: Aerobic culture-specimen not specified Patient Medications llergies: Actos, Demerol, glimepiride, nabumetone, oxycodone HCl, penicillin A Notifications Medication Indication Start End 01/19/2022 Bactrim DS DOSE oral 800 mg-160 mg tablet - 1 tab p.o. twice daily x10 days Electronic Signature(s) Signed: 01/19/2022 4:40:34 PM By: [name] Previous Signature: 01/19/2022 2:39:37 PM Version By: [name] Entered By: Fredirick Maudlin on 01/19/2022 14:39:58 Reagle, Marjory Lies ([id number]) [id number] Page 6 of 11 -------------------------------------------------------------------------------- Problem List Details Patient Name: Date of Service: Michael Olegario [name], Michael C. 01/19/2022 1:00 PM Medical Record Number: [id number] Patient Account Number: [id number] Date of  Birth/Sex: Treating RN: September 24, 1950 (71 y.o. [name] Primary Care Provider: [name] Other Clinician: Referring Provider: Treating Provider/Extender: [name] in Treatment: 6 Active Problems ICD-10 Encounter Code Description Active Date MDM Diagnosis L97.815 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 12/04/2021 No Yes involvement without evidence of necrosis I70.239 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 12/04/2021 No Yes E11.622 Type 2 diabetes mellitus with other skin ulcer 12/04/2021 No Yes I73.9 Peripheral vascular disease, unspecified 12/04/2021 No Yes L97.122 Non-pressure chronic ulcer of left thigh with fat layer exposed 01/14/2022 No Yes Inactive Problems Resolved Problems Electronic Signature(s) Signed: 01/19/2022 2:35:09 PM By: [name] Entered By: Fredirick Maudlin on 01/19/2022  14:35:09 -------------------------------------------------------------------------------- Progress Note Details Patient Name: Date of Service: Michael [name], Michael [facility] [address] C. 01/19/2022 1:00 PM Medical Record Number: [id number] Patient Account Number: [id number] Date of Birth/Sex: Treating RN: 1950/10/28 (71 y.o. M) Primary Care Provider: [name] Other Clinician: Referring Provider: Treating Provider/Extender: [name] in Treatment: 6 Subjective Chief Complaint Information obtained from Patient Patient presents to the wound care center today with an open arterial ulcer to the right lower extremity in the setting of diabetes mellitus. he has had this problem for 7 months 12/04/2021: ulcer to right lower anterior tibial surface ADETOKUNBO, MCCADDEN ([id number]) [id number] Page 7 of 11 History of Present Illness (HPI) The following HPI elements were documented for the patient's wound: Location: right lateral calf closer to the knee Quality: Patient reports experiencing a dull pain to affected area(s). Severity: Patient states wound(s) are getting worse. Duration: Patient has had the wound for > 7 months prior to seeking treatment at the wound center Timing: Pain in wound is constant (hurts all the time) Context: The wound would happen gradually Modifying Factors: Patient wound(s)/ulcer(s) are worsening due to : no resolution and a white material at the base of the wound Associated Signs and Symptoms: Patient reports having:no discharge or purulent material 71 year old gentleman who has been referred to was from his PCP for a chronic ulceration on his right lower extremity which she's had for several weeks. Past medical history significant for diabetes mellitus type 2, hypertension, hyperlipidemia, anxiety, obesity, peripheral vascular disease and chronic kidney disease. He has never been a smoker. Most recent lab work done at his PCPs office  showed a glucose of 217 milligrams per deciliter which is consistent with hyperglycemia. In January 2017 recent hemoglobin A1c values noted to be 8.2%. In April 2017, he has been seen at the vascular office by Dr. Trula Slade and Dr. Kellie Simmering for right leg claudication. He had a right superficial femoral artery stent in September 2012. Recent noninvasive vascular imaging done on 06/24/2015 showed a right ABI of 1.07 with a triphasic waveform and a right TBI of 0.81. The left was noncompressible with a triphasic waveform and a TBI of 1.17. Right lower extremity arterial duplex was unable to identify stent exit but they were elevated velocities at the stent and in the distal thigh with triphasic waveforms. Dr. Stephens Shire opinion was to return to the clinic in 6 months with ABI and right lower extremity arterial duplex studies to be done. He has seen a dermatologist who had done a biopsy and said it was benign and he was given a steroid cream. 10/23/2015 -- Pathology report of the biopsy done in April 2017 shows ulcer with underlying angiodermatitis consistent with stasis dermatitis. 10/30/2015 -- he is going  for shoulder surgery in about 10 days' time and was asking about the perioperative care. His blood sugars are running in the high 100s or low 200s. No hemoglobin A1c done recently. 11/20/2015 -- is back up to 2 weeks because of recent left shoulder surgery. 12/04/15; patient returns today with the wound for the most part looking healthy. No evidence of infection no debridement required. He is using Prisma for 4 weeks, without obvious improvement per her intake nurse. This started as several small open areas that were raised erythematous. He saw dermatology and at some point this was biopsied that just suggested stasis skin physiology. This certainly doesn't look like that 12/11/15; using Hydrafera Blue. Previous biopsy reviewed, no atypia PAS negative. He has a history of stents in the right leg however he  does not appear to have primary arterial insufficiency ABI in this clinic was over 0.9. 12/25/2015 -- he has been approved for grafix and we will order for some to be applied next week 01/01/2016 -- he has had his first application of Grafix today 01/08/2016 -- he has had his second application of Grafix today READMISSION 12/04/2021 This is a now 71 year old man who was followed in our clinic about 5 years ago for an ulceration on his right lateral lower leg, just distal to the knee. He has since undergone a number of revascularization procedures that have been complicated by restenosis and wound infections. During the course of this treatment, he developed a small ulcer on his right anterior tibial surface. Despite use of an Unna boot, the wound has continued to expand. He was referred to the wound care center by Dr. Trula Slade for further evaluation and management. On the right anterior tibial surface, there is an irregular wound with heavy slough and eschar accumulation. The periwound skin is intact but he does have 1-2+ pitting edema. There is no purulent drainage or malodor. 10/13; second visit for this man who has an ischemic wound in the setting of type 2 diabetes on the right anterior mid tibia area. He has been revascularized. Using Santyl gent 12/22/2021: The wound is about the same size this week. There is a lot of gray sloughy material on the surface, secondary to silver nitrate used to stop bleeding after what sounds like a fairly aggressive debridement last week. 12/30/2021: The wound is smaller this week and a bit cleaner. Edema control is excellent. There is still a bit of slough accumulation. 01/07/2022: The wound continues to contract and is much cleaner this week. Edema control is very good. He has small openings in his upper leg surgical scars, but he is going to be seeing Dr. Trula Slade next Monday and will have him take a look. He has been applying manuka honey to both of the sites.  We have been using Santyl and gentamicin on his lower leg wound. 01/14/2022: He saw vascular surgery this week and was told that they were happy to have [place] manage the 2 wounds from his operation. Both of these have a light layer of slough on the surface. The more distal wound is a little bit dry. The anterior tibial wound continues to accumulate a fair amount of slough. Edema control is good. 01/19/2022: Both upper leg wounds are smaller. The more distal is quite dry and cracked when I was examining it. The lower leg wound is more painful and the surrounding tissue is erythematous and indurated. Patient History Information obtained from Patient. Family History Cancer - Siblings, Heart Disease - Father, Hypertension -  Father,Mother, No family history of Hereditary Spherocytosis, Kidney Disease, Seizures, Stroke, Thyroid Problems, Tuberculosis. Social History Former smoker - smokeless tobacco, Marital Status - Married, Alcohol Use - Rarely - WINE, Drug Use - No History, Caffeine Use - Daily - COFFEE. Medical History Ear/Nose/Mouth/Throat Patient has history of Chronic sinus problems/congestion - seasonal allergies Cardiovascular Patient has history of Hypertension, Peripheral Arterial Disease - STENTS, Peripheral Venous Disease Endocrine Patient has history of Type II Diabetes - last A1c- 8.2 Hospitalization/Surgery History - left shoulder surgery. Medical A Surgical History Notes nd Constitutional Symptoms (General Health) obesity , h/o (R) leg claudication (right fem stent September 2012) Cardiovascular hyperlipidemia SAADIQ, POCHE ([id number]) [id number] Page 8 of 11 Endocrine pt. on diet intentionally losing 20 pounds since December 2016 in an effort to improve A1C levels Integumentary (Skin) psoriatic arthritis Musculoskeletal bilat knee pain identified as an ortho issue psoriatic arthritis Oncologic Prostate cancer  2018 Psychiatric anxiety Objective Constitutional Tachycardic, asymptomatic.Marland Kitchen No acute distress. Vitals Time Taken: 1:11 PM, Height: 72 in, Weight: 208 lbs, BMI: 28.2, Temperature: 98.3 F, Pulse: 111 bpm, Respiratory Rate: 18 breaths/min, Blood Pressure: 127/78 mmHg. Respiratory Normal work of breathing on room air. General Notes: 01/19/2022: Both upper leg wounds are smaller. The more distal is quite dry and cracked when I was examining it. The lower leg wound is more painful and the surrounding tissue is erythematous and indurated. Integumentary (Hair, Skin) Wound #3 status is Open. Original cause of wound was Blister. The date acquired was: 09/30/2021. The wound has been in treatment 6 weeks. The wound is located on the Right,Anterior Lower Leg. The wound measures 5.3cm length x 4cm width x 0.2cm depth; 16.65cm^2 area and 3.33cm^3 volume. There is tendon and Fat Layer (Subcutaneous Tissue) exposed. There is no tunneling or undermining noted. There is a medium amount of serosanguineous drainage noted. The wound margin is distinct with the outline attached to the wound base. There is small (1-33%) red, pink granulation within the wound bed. There is a large (67- 100%) amount of necrotic tissue within the wound bed including Adherent Slough. The periwound skin appearance had no abnormalities noted for texture. The periwound skin appearance had no abnormalities noted for moisture. The periwound skin appearance had no abnormalities noted for color. Periwound temperature was noted as No Abnormality. The periwound has tenderness on palpation. Wound #5 status is Open. Original cause of wound was Surgical Injury. The date acquired was: 11/18/2021. The wound is located on the Right,Medial Upper Leg. The wound measures 0.4cm length x 0.9cm width x 0.1cm depth; 0.283cm^2 area and 0.028cm^3 volume. There is Fat Layer (Subcutaneous Tissue) exposed. There is no tunneling or undermining noted. There is a  medium amount of serous drainage noted. There is medium (34-66%) granulation within the wound bed. There is a medium (34-66%) amount of necrotic tissue within the wound bed. Wound #6 status is Open. Original cause of wound was Surgical Injury. The date acquired was: 11/18/2021. The wound is located on the Right Groin. The wound measures 0.8cm length x 0.3cm width x 0.1cm depth; 0.188cm^2 area and 0.019cm^3 volume. There is Fat Layer (Subcutaneous Tissue) exposed. There is no tunneling or undermining noted. There is a medium amount of serous drainage noted. There is medium (34-66%) red granulation within the wound bed. There is a medium (34-66%) amount of necrotic tissue within the wound bed including Adherent Slough. The periwound skin appearance exhibited: Dry/Scaly. 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 There was a Selective/Open Wound Non-Viable Tissue Debridement with a total area of 0.36 sq cm performed by Fredirick Maudlin, MD. With the following instrument(s): Curette to remove Non-Viable tissue/material. Material removed includes [facility]. A time out was conducted at 13:33, prior to the start of the procedure. A Minimum amount of bleeding was controlled with Pressure. The procedure was tolerated well. Post Debridement Measurements: 0.4cm length x 0.9cm width x 0.1cm depth; 0.028cm^3 volume. Character of Wound/Ulcer Post Debridement requires further  debridement. Severity of Tissue Post Debridement is: Fat layer exposed. Post procedure Diagnosis Wound #5: Same as Pre-Procedure General Notes: Scribed for Dr. Celine Ahr by Blanche East, RN. Wound #6 Pre-procedure diagnosis of Wound #6 is a Diabetic Wound/Ulcer of the Lower Extremity located on the Right Groin .Severity of Tissue Pre Debridement is: Fat JAMON, HAYHURST ([id number]) [id number] Page 9 of 11 layer exposed. There was a Selective/Open Wound Non-Viable Tissue Debridement with a total area of 0.24 sq cm performed by Fredirick Maudlin, MD. With the following instrument(s): Curette to remove Non-Viable tissue/material. Material removed includes [facility] A time out was conducted at 13:33, prior to the start of the procedure. A Minimum amount of bleeding was controlled with Pressure. The procedure was tolerated well. Post Debridement Measurements: 0.8cm length x 0.3cm width x 0.1cm depth; 0.019cm^3 volume. Character of Wound/Ulcer Post Debridement requires further debridement. Severity of Tissue Post Debridement is: Fat layer exposed. Post procedure Diagnosis Wound #6: Same as Pre-Procedure General Notes: Scribed for Dr. Celine Ahr by Blanche East, RN. Plan Follow-up Appointments: Return Appointment in 1 week. - Dr. Celine Ahr - room 4 Anesthetic: Wound #3 Right,Anterior Lower Leg: (In clinic) Topical Lidocaine 5% applied to wound bed Bathing/ Shower/ Hygiene: May shower with protection but do not get wound dressing(s) wet. - purchase cast protector from [facility], [facility] or [employer] Edema Control - Lymphedema / SCD / Other: Elevate legs to the level of the heart or above for 30 minutes daily and/or when sitting, a frequency of: Avoid standing for long periods of time. Laboratory ordered were: Aerobic culture-specimen not specified - Non-healing wound to right lower leg The following medication(s) was prescribed: Bactrim DS oral 800 mg-160 mg tablet 1 tab p.o. twice daily  x10 days starting 01/19/2022 WOUND #3: - Lower Leg Wound Laterality: Right, Anterior Peri-Wound Care: Sween Lotion (Moisturizing lotion) Discharge Instructions: Apply moisturizing lotion as directed Prim Dressing: Promogran Prisma Matrix, 4.34 (sq in) (silver collagen) ary Discharge Instructions: Moisten collagen with saline or hydrogel Secondary Dressing: ABD Pad, 8x10 Discharge Instructions: Apply over primary dressing as directed. Secondary Dressing: Woven Gauze Sponge, Non-Sterile 4x4 in Discharge Instructions: Apply over primary dressing as directed. Secured With: [employer], 4.5x3.1 (in/yd) Discharge Instructions: Secure with Kerlix as directed. Secured With: [employer] Size 5, 10 (yds) Com pression Wrap: Unnaboot w/Calamine, 4x10 (in/yd) Discharge Instructions: Apply Unnaboot to top of dressing and around ankles to prevent dressing from sliding Com pression Wrap: Kerlix Roll 4.5x3.1 (in/yd) Discharge Instructions: Apply Kerlix and Coban compression as directed. Com pression Wrap: Coban Self-Adherent Wrap 4x5 (in/yd) Discharge Instructions: Apply over Kerlix as directed. WOUND #5: -  Upper Leg Wound Laterality: Right, Medial Cleanser: Soap and Water 1 x Per Day/30 Days Discharge Instructions: May shower and wash wound with dial antibacterial soap and water prior to dressing change. Prim Dressing: Promogran Prisma Matrix, 4.34 (sq in) (silver collagen) (Dispense As Written) 1 x Per Day/30 Days ary Discharge Instructions: Moisten collagen with saline or hydrogel Secondary Dressing: ALLEVYN Gentle Border, 4x4 (in/in) (Dispense As Written) 1 x Per Day/30 Days Discharge Instructions: Apply over primary dressing as directed. WOUND #6: - Groin Wound Laterality: Right Cleanser: Soap and Water 1 x Per Day/30 Days Discharge Instructions: May shower and wash wound with dial antibacterial soap and water prior to dressing change. Prim Dressing: Promogran Prisma Matrix, 4.34 (sq in)  (silver collagen) (Dispense As Written) 1 x Per Day/30 Days ary Discharge Instructions: Moisten collagen with saline or hydrogel Secondary Dressing: ALLEVYN Gentle Border, 4x4 (in/in) (Dispense As Written) 1 x Per Day/30 Days Discharge Instructions: Apply over primary dressing as directed. 01/19/2022: Both upper leg wounds are smaller. The more distal is quite dry and cracked when I was examining it. The lower leg wound is more painful and the surrounding tissue is erythematous and indurated. I used a curette to debride slough and eschar off of the upper leg wounds. The lower leg wound was so tender that he would not permit debridement, but I was able to take a culture from the wound. I am going to use Prisma silver collagen to all wound sites, moistened with hydrogel to improve the moisture balance of his wounds. I also empirically prescribed Bactrim and will tailor his antibiotic coverage appropriately once his culture data return. Continue Kerlix and Coban wrapping. Follow-up in 1 week. Electronic Signature(s) Signed: 01/19/2022 2:52:43 PM By: [name] Previous Signature: 01/19/2022 2:40:09 PM Version By: [name] Entered By: Fredirick Maudlin on 01/19/2022 14:52:43 Cherne, Marjory Lies ([id number]) [id number] Page 10 of 11 -------------------------------------------------------------------------------- HxROS Details Patient Name: Date of Service: Michael FFMA [name], Michael C. 01/19/2022 1:00 PM Medical Record Number: [id number] Patient Account Number: [id number] Date of Birth/Sex: Treating RN: 02/16/1951 (71 y.o. M) Primary Care Provider: [name] Other Clinician: Referring Provider: Treating Provider/Extender: [name] in Treatment: 6 Information Obtained From Patient Constitutional Symptoms (General Health) Medical History: Past Medical History Notes: obesity , h/o (R) leg claudication (right fem  stent September 2012) Ear/Nose/Mouth/Throat Medical History: Positive for: Chronic sinus problems/congestion - seasonal allergies Cardiovascular Medical History: Positive for: Hypertension; Peripheral Arterial Disease - STENTS; Peripheral Venous Disease Past Medical History Notes: hyperlipidemia Endocrine Medical History: Positive for: Type II Diabetes - last A1c- 8.2 Past Medical History Notes: pt. on diet intentionally losing 20 pounds since December 2016 in an effort to improve A1C levels Time with diabetes: 9 YRS Treated with: Insulin Blood sugar tested every day: Yes Tested : 7-8 TIMES Integumentary (Skin) Medical History: Past Medical History Notes: psoriatic arthritis Musculoskeletal Medical History: Past Medical History Notes: bilat knee pain identified as an ortho issue psoriatic arthritis Oncologic Medical History: Past Medical History Notes: Prostate cancer 2018 Psychiatric Medical History: Past Medical History Notes: anxiety HBO Extended History Items Ear/Nose/Mouth/Throat: Chronic sinus problems/congestion Immunizations Pneumococcal Vaccine: Received Pneumococcal Vaccination: Yes Received Pneumococcal Vaccination On or After 60th Birthday: Yes Implantable Devices None ADIL, TUGWELL ([id number]) [id number] Page 11 of 11 Hospitalization / Surgery History Type of Hospitalization/Surgery left shoulder surgery Family and Social History Cancer: Yes - Siblings; Heart Disease: Yes - Father; Hereditary Spherocytosis: No; Hypertension: Yes - Father,Mother; Kidney Disease:  No; Seizures: No; Stroke: No; Thyroid Problems: No; Tuberculosis: No; Former smoker - smokeless tobacco; Marital Status - Married; Alcohol Use: Rarely - WINE; Drug Use: No History; Caffeine Use: Daily - COFFEE; Financial Concerns: No; Food, Clothing or Shelter Needs: No; Support System Lacking: No; Transportation Concerns: No Electronic Signature(s) Signed:  01/19/2022 4:40:34 PM By: [name] Entered By: Fredirick Maudlin on 01/19/2022 14:37:19 -------------------------------------------------------------------------------- SuperBill Details Patient Name: Date of Service: Michael [name], Michael [name] C. 01/19/2022 Medical Record Number: [id number] Patient Account Number: [id number] Date of Birth/Sex: Treating RN: 20-Jul-1950 (71 y.o. 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 E11.622 Type 2 diabetes mellitus with other skin ulcer I73.9 Peripheral vascular disease, unspecified L97.122 Non-pressure chronic ulcer of left thigh with fat layer exposed Facility Procedures : CPT4 Code: [id number] Description: 73419 - DEBRIDE WOUND 1ST 20 SQ CM OR < ICD-10 Diagnosis Description L97.122 Non-pressure chronic ulcer of left thigh with fat layer exposed Modifier: Quantity: 1 Physician Procedures : CPT4 Code Description Modifier [id number] - WC PHYS LEVEL 4 - EST PT 25 ICD-10 Diagnosis Description L97.815 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

## 2022-01-21 ENCOUNTER — Encounter (HOSPITAL_BASED_OUTPATIENT_CLINIC_OR_DEPARTMENT_OTHER): Payer: Medicare Other | Admitting: General Surgery

## 2022-01-26 ENCOUNTER — Encounter (HOSPITAL_BASED_OUTPATIENT_CLINIC_OR_DEPARTMENT_OTHER): Payer: Medicare Other | Admitting: General Surgery

## 2022-01-26 DIAGNOSIS — E11622 Type 2 diabetes mellitus with other skin ulcer: Secondary | ICD-10-CM | POA: Diagnosis not present

## 2022-01-26 DIAGNOSIS — L97815 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: Secondary | ICD-10-CM | POA: Diagnosis not present

## 2022-01-26 DIAGNOSIS — L97122 Non-pressure chronic ulcer of left thigh with fat layer exposed: Secondary | ICD-10-CM | POA: Diagnosis not present

## 2022-01-26 DIAGNOSIS — E1122 Type 2 diabetes mellitus with diabetic chronic kidney disease: Secondary | ICD-10-CM | POA: Diagnosis not present

## 2022-01-26 DIAGNOSIS — I70239 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: Secondary | ICD-10-CM | POA: Diagnosis not present

## 2022-01-26 DIAGNOSIS — E1151 Type 2 diabetes mellitus with diabetic peripheral angiopathy without gangrene: Secondary | ICD-10-CM | POA: Diagnosis not present

## 2022-01-26 DIAGNOSIS — I129 Hypertensive chronic kidney disease with stage 1 through stage 4 chronic kidney disease, or unspecified chronic kidney disease: Secondary | ICD-10-CM | POA: Diagnosis not present

## 2022-01-26 DIAGNOSIS — N189 Chronic kidney disease, unspecified: Secondary | ICD-10-CM | POA: Diagnosis not present

## 2022-01-26 NOTE — Progress Notes (Signed)
Michael [name], Michael [name] ([id number]) [id number] Page 1 of 10 Visit Report for 01/26/2022 Chief Complaint Document Details Patient Name: Date of Service: Michael [name], [name] 01/26/2022 3:15 PM Medical Record Number: [id number] Patient Account Number: [id number] Date of Birth/Sex: Treating RN: 05/08/1950 (71 y.o. M) Primary Care Provider: [name] Other Clinician: Referring Provider: Treating Provider/Extender: [name] in Treatment: 7 Information Obtained from: Patient Chief Complaint Patient presents to the wound care center today with an open arterial ulcer to the right lower extremity in the setting of diabetes mellitus. he has had this problem for 7 months 12/04/2021: ulcer to right lower anterior tibial surface Electronic Signature(s) Signed: 01/26/2022 4:00:06 PM By: [name] Entered By: Fredirick Maudlin on 01/26/2022 16:00:05 -------------------------------------------------------------------------------- Debridement Details Patient Name: Date of Service: Michael [name], [place] [name]. 01/26/2022 3:15 PM Medical Record Number: [id number] Patient Account Number: [id number] Date of Birth/Sex: Treating RN: [birth year] (71 y.o. [name] Primary Care Provider: [name] Other Clinician: Referring Provider: Treating Provider/Extender: [name] in Treatment: 7 Debridement Performed for Assessment: Wound #3 Right,Anterior Lower Leg Performed By: Physician Fredirick Maudlin, MD Debridement Type: Debridement Severity of Tissue Pre Debridement: Fat layer exposed Level of Consciousness (Pre-procedure): Awake and Alert Pre-procedure Verification/Time Out Yes - 15:45 Taken: Start Time: 15:46 Pain Control: Lidocaine 4% T opical Solution T Area Debrided (L x W): otal 5 (cm) x 5 (cm) = 25 (cm) Tissue and other material debrided: Viable, Non-Viable, Slough, Subcutaneous,  Slough Level: Skin/Subcutaneous Tissue Debridement Description: Excisional Instrument: Curette Bleeding: Minimum Hemostasis Achieved: Pressure Response to Treatment: Procedure was tolerated well Level of Consciousness (Post- Awake and Alert procedure): Post Debridement Measurements of Total Wound Length: (cm) 5 Width: (cm) 5 Depth: (cm) 0.1 Volume: (cm) 1.963 Character of Wound/Ulcer Post Debridement: Requires Further Debridement Severity of Tissue Post Debridement: Fat layer exposed Michael [name] ([id number]) [id number] Page 2 of 10 Post Procedure Diagnosis Same as Pre-procedure Notes Scribed for Dr. Celine Ahr by Blanche East, RN Electronic Signature(s) Signed: 01/26/2022 4:55:50 PM By: [name] Signed: 01/26/2022 4:56:20 PM By: [name] Entered By: Blanche East on 01/26/2022 15:48:26 -------------------------------------------------------------------------------- HPI Details Patient Name: Date of Service: Michael [name], [place] [name]. 01/26/2022 3:15 PM Medical Record Number: [id number] Patient Account Number: [id number] Date of Birth/Sex: Treating RN: 1951-02-24 (71 y.o. M) Primary Care Provider: [name] Other Clinician: Referring Provider: Treating Provider/Extender: [name] in Treatment: 7 History of Present Illness Location: right lateral calf closer to the knee Quality: Patient reports experiencing a dull pain to affected area(s). Severity: Patient states wound(s) are getting worse. Duration: Patient has had the wound for > 7 months prior to seeking treatment at the wound center Timing: Pain in wound is constant (hurts all the time) Context: The wound would happen gradually Modifying Factors: Patient wound(s)/ulcer(s) are worsening due to : no resolution and a white material at the base of the wound ssociated Signs and Symptoms: Patient reports having:no discharge or purulent  material A HPI Description: 71 year old gentleman who has been referred to was from his PCP for a chronic ulceration on his right lower extremity which she's had for several weeks. Past medical history significant for diabetes mellitus type 2, hypertension, hyperlipidemia, anxiety, obesity, peripheral vascular disease and chronic kidney disease. He has never been a smoker. Most recent lab work done at his PCPs office showed a glucose of 217 milligrams per deciliter which is consistent with hyperglycemia.  In January 2017 recent hemoglobin A1c values noted to be 8.2%. In April 2017, he has been seen at the vascular office by Dr. Trula Slade and Dr. Kellie Simmering for right leg claudication. He had a right superficial femoral artery stent in September 2012. Recent noninvasive vascular imaging done on 06/24/2015 showed a right ABI of 1.07 with a triphasic waveform and a right TBI of 0.81. The left was noncompressible with a triphasic waveform and a TBI of 1.17. Right lower extremity arterial duplex was unable to identify stent exit but they were elevated velocities at the stent and in the distal thigh with triphasic waveforms. Dr. Stephens Shire opinion was to return to the clinic in 6 months with ABI and right lower extremity arterial duplex studies to be done. He has seen a dermatologist who had done a biopsy and said it was benign and he was given a steroid cream. 10/23/2015 -- Pathology report of the biopsy done in April 2017 shows ulcer with underlying angiodermatitis consistent with stasis dermatitis. 10/30/2015 -- he is going for shoulder surgery in about 10 days' time and was asking about the perioperative care. His blood sugars are running in the high 100s or low 200s. No hemoglobin A1c done recently. 11/20/2015 -- is back up to 2 weeks because of recent left shoulder surgery. 12/04/15; patient returns today with the wound for the most part looking healthy. No evidence of infection no debridement required. He is  using Prisma for 4 weeks, without obvious improvement per her intake nurse. This started as several small open areas that were raised erythematous. He saw dermatology and at some point this was biopsied that just suggested stasis skin physiology. This certainly doesn't look like that 12/11/15; using Hydrafera Blue. Previous biopsy reviewed, no atypia PAS negative. He has a history of stents in the right leg however he does not appear to have primary arterial insufficiency ABI in this clinic was over 0.9. 12/25/2015 -- he has been approved for grafix and we will order for some to be applied next week 01/01/2016 -- he has had his first application of Grafix today 01/08/2016 -- he has had his second application of Grafix today READMISSION 12/04/2021 This is a now 71 year old man who was followed in our clinic about 5 years ago for an ulceration on his right lateral lower leg, just distal to the knee. He has since undergone a number of revascularization procedures that have been complicated by restenosis and wound infections. During the course of this treatment, he developed a small ulcer on his right anterior tibial surface. Despite use of an Unna boot, the wound has continued to expand. He was referred to the wound care center by Dr. Trula Slade for further evaluation and management. On the right anterior tibial surface, there is an irregular wound with heavy slough and eschar accumulation. The periwound skin is intact but he does have 1-2+ pitting edema. There is no purulent drainage or malodor. 10/13; second visit for this man who has an ischemic wound in the setting of type 2 diabetes on the right anterior mid tibia area. He has been revascularized. Using Santyl gent 12/22/2021: The wound is about the same size this week. There is a lot of gray sloughy material on the surface, secondary to silver nitrate used to stop bleeding after what sounds like a fairly aggressive debridement last  week. 12/30/2021: The wound is smaller this week and a bit cleaner. Edema control is excellent. There is still a bit of slough accumulation. Michael [name], Michael [name] ([id number])  [phone].pdf Page 3 of 10 01/07/2022: The wound continues to contract and is much cleaner this week. Edema control is very good. He has small openings in his upper leg surgical scars, but he is going to be seeing Dr. Trula Slade next Monday and will have him take a look. He has been applying manuka honey to both of the sites. We have been using Santyl and gentamicin on his lower leg wound. 01/14/2022: He saw vascular surgery this week and was told that they were happy to have [place] manage the 2 wounds from his operation. Both of these have a light layer of slough on the surface. The more distal wound is a little bit dry. The anterior tibial wound continues to accumulate a fair amount of slough. Edema control is good. 01/19/2022: Both upper leg wounds are smaller. The more distal is quite dry and cracked when I was examining it. The lower leg wound is more painful and the surrounding tissue is erythematous and indurated. 01/26/2022: The upper leg wounds are healed. The skin at the distal leg wound is quite dry. The lower leg wound is cleaner but still fairly painful. His wrap slid again this week. Electronic Signature(s) Signed: 01/26/2022 4:00:55 PM By: [name] Entered By: Fredirick Maudlin on 01/26/2022 16:00:55 -------------------------------------------------------------------------------- Physical Exam Details Patient Name: Date of Service: Michael [place], [name] 01/26/2022 3:15 PM Medical Record Number: [id number] Patient Account Number: [id number] Date of Birth/Sex: Treating RN: September 19, 1950 (71 y.o. M) Primary Care Provider: [name] Other Clinician: Referring Provider: Treating Provider/Extender: [name] in Treatment: 7 Constitutional . . . . No  acute distress. Respiratory Normal work of breathing on room air. Notes 01/26/2022: The upper leg wounds are healed. The skin at the distal leg wound is quite dry. The lower leg wound is cleaner but still fairly painful. Electronic Signature(s) Signed: 01/26/2022 4:01:23 PM By: [name] Entered By: Fredirick Maudlin on 01/26/2022 16:01:23 -------------------------------------------------------------------------------- Physician Orders Details Patient Name: Date of Service: Michael [place], [place] [name]. 01/26/2022 3:15 PM Medical Record Number: [id number] Patient Account Number: [id number] Date of Birth/Sex: Treating RN: 04/16/1950 (71 y.o. [name] Primary Care Provider: [name] Other Clinician: Referring Provider: Treating Provider/Extender: [name] in Treatment: 7 Verbal / Phone Orders: No Diagnosis Coding ICD-10 Coding Code Description [phone] 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 I70.239 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 E11.622 Type 2 diabetes mellitus with other skin ulcer I73.9 Peripheral vascular disease, unspecified Michael [name], Michael [name] ([id number]) [id number] Page 4 of 10 [phone] Non-pressure chronic ulcer of left thigh with fat layer exposed Follow-up Appointments ppointment in 1 week. - Dr. Celine Ahr - room 4 Return A Anesthetic Wound #3 Right,Anterior Lower Leg (In clinic) Topical Lidocaine 5% applied to wound bed Bathing/ Shower/ Hygiene May shower with protection but do not get wound dressing(s) wet. - purchase cast protector from [facility], [facility] or [employer] Edema Control - Lymphedema / SCD / Other Right Lower Extremity Elevate legs to the level of the heart or above for 30 minutes daily and/or when sitting, a frequency of: Avoid standing for long periods of time. Wound  Treatment Wound #3 - Lower Leg Wound Laterality: Right, Anterior Peri-Wound Care: Sween Lotion (Moisturizing lotion) Discharge Instructions: Apply moisturizing lotion as directed Prim Dressing: Promogran Prisma Matrix, 4.34 (sq in) (silver collagen) ary Discharge Instructions: Moisten collagen with  saline or hydrogel Secondary Dressing: ABD Pad, 8x10 Discharge Instructions: Apply over primary dressing as directed. Secondary Dressing: Woven Gauze Sponge, Non-Sterile 4x4 in Discharge Instructions: Apply over primary dressing as directed. Secured With: [employer], 4.5x3.1 (in/yd) Discharge Instructions: Secure with Kerlix as directed. Secured With: [employer] Size 5, 10 (yds) Compression Wrap: Unnaboot w/Calamine, 4x10 (in/yd) Discharge Instructions: Apply Unnaboot to top of dressing and around ankles to prevent dressing from sliding Compression Wrap: Kerlix Roll 4.5x3.1 (in/yd) Discharge Instructions: Apply Kerlix and Coban compression as directed. Compression Wrap: Coban Self-Adherent Wrap 4x5 (in/yd) Discharge Instructions: Apply over Kerlix as directed. Wound #5 - Upper Leg Wound Laterality: Right, Medial Cleanser: Soap and Water 1 x Per Day/30 Days Discharge Instructions: May shower and wash wound with dial antibacterial soap and water prior to dressing change. Prim Dressing: Promogran Prisma Matrix, 4.34 (sq in) (silver collagen) (Dispense As Written) 1 x Per Day/30 Days ary Discharge Instructions: Moisten collagen with saline or hydrogel Secondary Dressing: ALLEVYN Gentle Border, 4x4 (in/in) (Dispense As Written) 1 x Per Day/30 Days Discharge Instructions: Apply over primary dressing as directed. Electronic Signature(s) Signed: 01/26/2022 4:56:20 PM By: [name] Entered By: Fredirick Maudlin on 01/26/2022 16:01:38 -------------------------------------------------------------------------------- Problem List Details Patient Name: Date of Service: Michael [place], [place] [name]. 01/26/2022 3:15 PM Medical Record Number: [id number] Patient Account Number: [id number] Date of Birth/Sex: Treating RN: 1950/04/26 (71 y.o. M) Primary Care Provider: [name] Other Clinician: Referring Provider: Treating Provider/Extender: [place], Dresser, Marjory Lies ([id number]) [id number] Page 5 of 10 Weeks in Treatment: 7 Active Problems ICD-10 Encounter Code Description Active Date MDM Diagnosis L97.815 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 12/04/2021 No Yes involvement without evidence of necrosis I70.239 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 12/04/2021 No Yes E11.622 Type 2 diabetes mellitus with other skin ulcer 12/04/2021 No Yes I73.9 Peripheral vascular disease, unspecified 12/04/2021 No Yes L97.122 Non-pressure chronic ulcer of left thigh with fat layer exposed 01/14/2022 No Yes Inactive Problems Resolved Problems Electronic Signature(s) Signed: 01/26/2022 3:59:22 PM By: [name] Entered By: Fredirick Maudlin on 01/26/2022 15:59:22 -------------------------------------------------------------------------------- Progress Note Details Patient Name: Date of Service: Michael FFMA N, [place] [name]. 01/26/2022 3:15 PM Medical Record Number: [id number] Patient Account Number: [id number] Date of Birth/Sex: Treating RN: [birth year] (70 y.o. M) Primary Care Provider: [name] Other Clinician: Referring Provider: Treating Provider/Extender: [name] in Treatment: 7 Subjective Chief Complaint Information obtained from Patient Patient presents to the wound care center today with an open arterial ulcer to the right lower extremity in the setting of diabetes mellitus. he has had this problem for 7 months 12/04/2021: ulcer to right lower anterior tibial surface History of Present Illness (HPI) The  following HPI elements were documented for the patient's wound: Location: right lateral calf closer to the knee Quality: Patient reports experiencing a dull pain to affected area(s). Severity: Patient states wound(s) are getting worse. Duration: Patient has had the wound for > 7 months prior to seeking treatment at the wound center Timing: Pain in wound is constant (hurts all the time) Context: The wound would happen gradually Modifying Factors: Patient wound(s)/ulcer(s) are worsening due to : no resolution and a white material at the base of the wound Associated Signs and Symptoms: Patient reports having:no discharge or purulent material 71 year old gentleman who has been referred to was from his PCP for a chronic ulceration on his  right lower extremity which she's had for several weeks. Past medical history significant for diabetes mellitus type 2, hypertension, hyperlipidemia, anxiety, obesity, peripheral vascular disease and chronic kidney disease. He has never been a smoker. Most recent lab work done at his PCPs office showed a glucose of 217 milligrams per deciliter which is consistent with hyperglycemia. In January 2017 recent hemoglobin A1c values noted to be 8.2%. Michael [name], Michael [name] ([id number]) [id number] Page 6 of 10 In April 2017, he has been seen at the vascular office by Dr. Trula Slade and Dr. Kellie Simmering for right leg claudication. He had a right superficial femoral artery stent in September 2012. Recent noninvasive vascular imaging done on 06/24/2015 showed a right ABI of 1.07 with a triphasic waveform and a right TBI of 0.81. The left was noncompressible with a triphasic waveform and a TBI of 1.17. Right lower extremity arterial duplex was unable to identify stent exit but they were elevated velocities at the stent and in the distal thigh with triphasic waveforms. Dr. Stephens Shire opinion was to return to the clinic in 6 months with ABI and right lower extremity  arterial duplex studies to be done. He has seen a dermatologist who had done a biopsy and said it was benign and he was given a steroid cream. 10/23/2015 -- Pathology report of the biopsy done in April 2017 shows ulcer with underlying angiodermatitis consistent with stasis dermatitis. 10/30/2015 -- he is going for shoulder surgery in about 10 days' time and was asking about the perioperative care. His blood sugars are running in the high 100s or low 200s. No hemoglobin A1c done recently. 11/20/2015 -- is back up to 2 weeks because of recent left shoulder surgery. 12/04/15; patient returns today with the wound for the most part looking healthy. No evidence of infection no debridement required. He is using Prisma for 4 weeks, without obvious improvement per her intake nurse. This started as several small open areas that were raised erythematous. He saw dermatology and at some point this was biopsied that just suggested stasis skin physiology. This certainly doesn't look like that 12/11/15; using Hydrafera Blue. Previous biopsy reviewed, no atypia PAS negative. He has a history of stents in the right leg however he does not appear to have primary arterial insufficiency ABI in this clinic was over 0.9. 12/25/2015 -- he has been approved for grafix and we will order for some to be applied next week 01/01/2016 -- he has had his first application of Grafix today 01/08/2016 -- he has had his second application of Grafix today READMISSION 12/04/2021 This is a now 71 year old man who was followed in our clinic about 5 years ago for an ulceration on his right lateral lower leg, just distal to the knee. He has since undergone a number of revascularization procedures that have been complicated by restenosis and wound infections. During the course of this treatment, he developed a small ulcer on his right anterior tibial surface. Despite use of an Unna boot, the wound has continued to expand. He was referred  to the wound care center by Dr. Trula Slade for further evaluation and management. On the right anterior tibial surface, there is an irregular wound with heavy slough and eschar accumulation. The periwound skin is intact but he does have 1-2+ pitting edema. There is no purulent drainage or malodor. 10/13; second visit for this man who has an ischemic wound in the setting of type 2 diabetes on the right anterior mid tibia area. He has been revascularized. Using Santyl gent  12/22/2021: The wound is about the same size this week. There is a lot of gray sloughy material on the surface, secondary to silver nitrate used to stop bleeding after what sounds like a fairly aggressive debridement last week. 12/30/2021: The wound is smaller this week and a bit cleaner. Edema control is excellent. There is still a bit of slough accumulation. 01/07/2022: The wound continues to contract and is much cleaner this week. Edema control is very good. He has small openings in his upper leg surgical scars, but he is going to be seeing Dr. Trula Slade next Monday and will have him take a look. He has been applying manuka honey to both of the sites. We have been using Santyl and gentamicin on his lower leg wound. 01/14/2022: He saw vascular surgery this week and was told that they were happy to have [place] manage the 2 wounds from his operation. Both of these have a light layer of slough on the surface. The more distal wound is a little bit dry. The anterior tibial wound continues to accumulate a fair amount of slough. Edema control is good. 01/19/2022: Both upper leg wounds are smaller. The more distal is quite dry and cracked when I was examining it. The lower leg wound is more painful and the surrounding tissue is erythematous and indurated. 01/26/2022: The upper leg wounds are healed. The skin at the distal leg wound is quite dry. The lower leg wound is cleaner but still fairly painful. His wrap slid again this week. Patient  History Information obtained from Patient. Family History Cancer - Siblings, Heart Disease - Father, Hypertension - Father,Mother, No family history of Hereditary Spherocytosis, Kidney Disease, Seizures, Stroke, Thyroid Problems, Tuberculosis. Social History Former smoker - smokeless tobacco, Marital Status - Married, Alcohol Use - Rarely - WINE, Drug Use - No History, Caffeine Use - Daily - COFFEE. Medical History Ear/Nose/Mouth/Throat Patient has history of Chronic sinus problems/congestion - seasonal allergies Cardiovascular Patient has history of Hypertension, Peripheral Arterial Disease - STENTS, Peripheral Venous Disease Endocrine Patient has history of Type II Diabetes - last A1c- 8.2 Hospitalization/Surgery History - left shoulder surgery. Medical A Surgical History Notes nd Constitutional Symptoms (General Health) obesity , h/o (R) leg claudication (right fem stent September 2012) Cardiovascular hyperlipidemia Endocrine pt. on diet intentionally losing 20 pounds since December 2016 in an effort to improve A1C levels Integumentary (Skin) psoriatic arthritis Musculoskeletal bilat knee pain identified as an ortho issue psoriatic arthritis Oncologic Prostate cancer 2018 Psychiatric anxiety Michael [name], Michael [name] ([id number]) [id number] Page 7 of 10 Objective Constitutional No acute distress. Vitals Time Taken: 3:24 PM, Height: 72 in, Weight: 208 lbs, BMI: 28.2, Temperature: 97.7 F, Pulse: 85 bpm, Respiratory Rate: 18 breaths/min, Blood Pressure: 134/73 mmHg, Capillary Blood Glucose: 102 mg/dl. Respiratory Normal work of breathing on room air. General Notes: 01/26/2022: The upper leg wounds are healed. The skin at the distal leg wound is quite dry. The lower leg wound is cleaner but still fairly painful. Integumentary (Hair, Skin) Wound #3 status is Open. Original cause of wound was Blister. The date acquired was: 09/30/2021. The wound has been in  treatment 7 weeks. The wound is located on the Right,Anterior Lower Leg. The wound measures 5cm length x 5cm width x 0.1cm depth; 19.635cm^2 area and 1.963cm^3 volume. There is tendon and Fat Layer (Subcutaneous Tissue) exposed. There is no tunneling or undermining noted. There is a medium amount of serosanguineous drainage noted. The wound margin is distinct with the outline attached to the wound base.  There is small (1-33%) red, pink granulation within the wound bed. There is a large (67- 100%) amount of necrotic tissue within the wound bed including Adherent Slough. The periwound skin appearance had no abnormalities noted for texture. The periwound skin appearance had no abnormalities noted for color. The periwound skin appearance exhibited: Maceration. Periwound temperature was noted as No Abnormality. The periwound has tenderness on palpation. Wound #5 status is Open. Original cause of wound was Surgical Injury. The date acquired was: 11/18/2021. The wound has been in treatment 1 weeks. The wound is located on the Right,Medial Upper Leg. The wound measures 0.1cm length x 0.1cm width x 0.1cm depth; 0.008cm^2 area and 0.001cm^3 volume. There is Fat Layer (Subcutaneous Tissue) exposed. There is no tunneling or undermining noted. There is a none present amount of drainage noted. The wound margin is flat and intact. There is medium (34-66%) granulation within the wound bed. There is a medium (34-66%) amount of necrotic tissue within the wound bed. Wound #6 status is Healed - Epithelialized. Original cause of wound was Surgical Injury. The date acquired was: 11/18/2021. The wound has been in treatment 1 weeks. The wound is located on the Right Groin. The wound measures 0cm length x 0cm width x 0cm depth; 0cm^2 area and 0cm^3 volume. There is Fat Layer (Subcutaneous Tissue) exposed. There is no tunneling or undermining noted. There is a none present amount of drainage noted. The wound margin is flat  and intact. There is medium (34-66%) red granulation within the wound bed. There is a medium (34-66%) amount of necrotic tissue within the wound bed. The periwound skin appearance exhibited: Dry/Scaly. 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 There was a Excisional Skin/Subcutaneous Tissue Debridement with a total area of 25 sq cm performed by Fredirick Maudlin, MD. With the following instrument(s): Curette to remove Viable and Non-Viable tissue/material. Material removed includes Subcutaneous Tissue and Slough and after achieving pain control using Lidocaine 4% T opical Solution. No specimens were taken. A time out was conducted at 15:45, prior to the start of the procedure. A Minimum amount of bleeding was controlled with Pressure. The procedure was tolerated well. Post Debridement Measurements: 5cm length x 5cm width x 0.1cm depth; 1.963cm^3 volume. Character of Wound/Ulcer Post Debridement requires further debridement. Severity of Tissue Post Debridement is: Fat layer exposed. Post procedure Diagnosis Wound #3: Same as Pre-Procedure General Notes: Scribed for Dr. Celine Ahr by Blanche East, RN. Plan Follow-up Appointments: Return Appointment in 1 week. - Dr. Celine Ahr - room 4 Anesthetic: Wound #3 Right,Anterior Lower Leg: (In clinic) Topical Lidocaine 5% applied to wound bed Michael [name], Michael [name] ([id number]) [id number] Page 8 of  10 Bathing/ Shower/ Hygiene: May shower with protection but do not get wound dressing(s) wet. - purchase cast protector from [facility], [facility] or [employer] Edema Control - Lymphedema / SCD / Other: Elevate legs to the level of the heart or above for 30 minutes daily and/or when sitting, a frequency of: Avoid standing for long periods of time. WOUND #3: - Lower Leg Wound Laterality: Right,  Anterior Peri-Wound Care: Sween Lotion (Moisturizing lotion) Discharge Instructions: Apply moisturizing lotion as directed Prim Dressing: Promogran Prisma Matrix, 4.34 (sq in) (silver collagen) ary Discharge Instructions: Moisten collagen with saline or hydrogel Secondary Dressing: ABD Pad, 8x10 Discharge Instructions: Apply over primary dressing as directed. Secondary Dressing: Woven Gauze Sponge, Non-Sterile 4x4 in Discharge Instructions: Apply over primary dressing as directed. Secured With: [employer], 4.5x3.1 (in/yd) Discharge Instructions: Secure with Kerlix as directed. Secured With: [employer] Size 5, 10 (yds) Com pression Wrap: Unnaboot w/Calamine, 4x10 (in/yd) Discharge Instructions: Apply Unnaboot to top of dressing and around ankles to prevent dressing from sliding Com pression Wrap: Kerlix Roll 4.5x3.1 (in/yd) Discharge Instructions: Apply Kerlix and Coban compression as directed. Com pression Wrap: Coban Self-Adherent Wrap 4x5 (in/yd) Discharge Instructions: Apply over Kerlix as directed. WOUND #5: - Upper Leg Wound Laterality: Right, Medial Cleanser: Soap and Water 1 x Per Day/30 Days Discharge Instructions: May shower and wash wound with dial antibacterial soap and water prior to dressing change. Prim Dressing: Promogran Prisma Matrix, 4.34 (sq in) (silver collagen) (Dispense As Written) 1 x Per Day/30 Days ary Discharge Instructions: Moisten collagen with saline or hydrogel Secondary Dressing: ALLEVYN Gentle Border, 4x4 (in/in) (Dispense As Written) 1 x Per Day/30 Days Discharge  Instructions: Apply over primary dressing as directed. 01/26/2022: The upper leg wounds are healed. The skin at the distal leg wound is quite dry. The lower leg wound is cleaner but still fairly painful. I used a curette to debride slough and nonviable subcutaneous tissue from his lower leg wound. We will continue Prisma silver collagen. I am going to try standard Unna boot to see if this prevents the slippage that we have been experiencing. I recommended that he moisturize the upper leg wounds heavily to avoid cracking of the dry skin in that area. We are also going to run his insurance for TheraSkin and Apligraf, as I think the wound on his lower leg is ready for a skin substitute. Follow-up in 1 week. Electronic Signature(s) Signed: 01/26/2022 4:03:04 PM By: [name] Previous Signature: 01/26/2022 4:02:34 PM Version By: [name] Entered By: Fredirick Maudlin on 01/26/2022 16:03:04 -------------------------------------------------------------------------------- HxROS Details Patient Name: Date of Service: Michael [place], [place] [name]. 01/26/2022 3:15 PM Medical Record Number: [id number] Patient Account Number: [id number] Date of Birth/Sex: Treating RN: 1951-02-26 (71 y.o. M) Primary Care Provider: [name] Other Clinician: Referring Provider: Treating Provider/Extender: [name] in Treatment: 7 Information Obtained From Patient Constitutional Symptoms (General Health) Medical History: Past Medical History Notes: obesity , h/o (R) leg claudication (right fem stent September 2012) Ear/Nose/Mouth/Throat Medical History: Positive for: Chronic sinus problems/congestion - seasonal allergies Cardiovascular Medical History: Positive for: Hypertension; Peripheral Arterial Disease - STENTS; Peripheral Venous Disease Cat[place], [place] [name] ([id number]) [id number] Page 9 of 10 Past Medical History  Notes: hyperlipidemia Endocrine Medical History: Positive for: Type II Diabetes - last A1c- 8.2 Past Medical History Notes: pt. on diet intentionally losing 20 pounds since December 2016 in an effort to improve A1C levels Time with diabetes: 9 YRS Treated with: Insulin Blood sugar tested every day: Yes Tested : 7-8 TIMES Integumentary (Skin) Medical History: Past Medical History Notes: psoriatic arthritis Musculoskeletal Medical History: Past Medical History Notes: bilat knee pain identified as an ortho issue psoriatic arthritis Oncologic Medical History: Past Medical History Notes: Prostate cancer 2018 Psychiatric Medical History: Past Medical History Notes: anxiety HBO Extended History Items Ear/Nose/Mouth/Throat: Chronic sinus problems/congestion Immunizations Pneumococcal Vaccine: Received Pneumococcal  Vaccination: Yes Received Pneumococcal Vaccination On or After 60th Birthday: Yes Implantable Devices None Hospitalization / Surgery History Type of Hospitalization/Surgery left shoulder surgery Family and Social History Cancer: Yes - Siblings; Heart Disease: Yes - Father; Hereditary Spherocytosis: No; Hypertension: Yes - Father,Mother; Kidney Disease: No; Seizures: No; Stroke: No; Thyroid Problems: No; Tuberculosis: No; Former smoker - smokeless tobacco; Marital Status - Married; Alcohol Use: Rarely - WINE; Drug Use: No History; Caffeine Use: Daily - COFFEE; Financial Concerns: No; Food, Clothing or Shelter Needs: No; Support System Lacking: No; Transportation Concerns: No Electronic Signature(s) Signed: 01/26/2022 4:56:20 PM By: [name] Entered By: Fredirick Maudlin on 01/26/2022 16:01:02 SuperBill Details -------------------------------------------------------------------------------- [name] ([id number]) [id number] Page 10 of 10 Patient Name: Date of Service: Michael FFMA [place], [name] 01/26/2022 Medical  Record Number: [id number] Patient Account Number: [id number] Date of Birth/Sex: Treating RN: 1951-01-14 (71 y.o. 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 E11.622 Type 2 diabetes mellitus with other skin ulcer I73.9 Peripheral vascular disease, unspecified L97.122 Non-pressure chronic ulcer of left thigh with fat layer exposed Facility Procedures CPT4 Code Description Modifier Quantity [id number] 11042 - DEB SUBQ TISSUE 20 SQ CM/< 1 ICD-10 Diagnosis Description L97.815 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 [id number] 11045 - DEB SUBQ TISS EA ADDL 20CM 1 ICD-10 Diagnosis Description L97.815 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 Physician Procedures Quantity CPT4 Code Description Modifier [id number] - WC PHYS LEVEL 3 - EST PT 25 1 ICD-10 Diagnosis Description L97.815 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 I70.239 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 E11.622 Type 2 diabetes mellitus with other skin ulcer I73.9 Peripheral vascular disease, unspecified [id number] 11042 - WC PHYS SUBQ TISS 20 SQ CM 1 ICD-10 Diagnosis Description L97.815 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

## 2022-01-26 NOTE — Progress Notes (Addendum)
[name], Michael C ([id number]) [id number] Page 1 of 11 Visit Report for 01/26/2022 Arrival Information Details Patient Name: Date of Service: [name], [name] 01/26/2022 3:15 PM Medical Record Number: [id number] Patient Account Number: [id number] Date of Birth/Sex: Treating RN: 09/11/1950 (71 y.o. M) [name], Michael Primary Care : Stamey, Hannah Other Clinician: Referring : Treating /Extender: Cannon, Jennifer Stamey, Hannah Weeks in Treatment: 7 Visit Information History Since Last Visit Added or deleted any medications: No Patient Arrived: Ambulatory Any new allergies or adverse reactions: No Arrival Time: 15:24 Had a fall or experienced change in No Accompanied By: wife activities of daily living that may affect Transfer Assistance: None risk of falls: Patient Requires Transmission-Based Precautions: No Signs or symptoms of abuse/neglect since last visito No Patient Has Alerts: Yes Hospitalized since last visit: No Patient Alerts: Patient on Blood Thinner Implantable device outside of the clinic excluding No plavix cellular tissue based products placed in the center since last visit: Has Compression in Place as Prescribed: Yes Pain Present Now: Yes Electronic Signature(s) Signed: 01/26/2022 4:55:50 PM By: [name], [name] RN Entered By: [name], Michael on 01/26/2022 15:24:55 -------------------------------------------------------------------------------- Compression Therapy Details Patient Name: Date of Service: [name], [name] 01/26/2022 3:15 PM Medical Record Number: [id number] Patient Account Number: [id number] Date of Birth/Sex: Treating RN: 06/04/1950 (71 y.o. M) [name], Michael Primary Care : Stamey, Hannah Other Clinician: Referring : Treating /Extender: Cannon, Jennifer Stamey, Hannah Weeks in Treatment: 7 Compression Therapy Performed for Wound Assessment: Wound #3 Right,Anterior Lower  Leg Performed By: Clinician [name], [name], RN Compression Type: Unna Boot Post Procedure Diagnosis Same as Pre-procedure Electronic Signature(s) Signed: 01/29/2022 7:35:29 PM By: [name], [name] RN Entered By: [name], Michael on 01/26/2022 16:58:13 Hainline, Alon C ([id number]) [id number] Page 2 of 11 -------------------------------------------------------------------------------- Encounter Discharge Information Details Patient Name: Date of Service: [name], [name] 01/26/2022 3:15 PM Medical Record Number: [id number] Patient Account Number: [id number] Date of Birth/Sex: Treating RN: 05/30/1950 (71 y.o. M) [name], Michael Primary Care : Stamey, Hannah Other Clinician: Referring : Treating /Extender: Cannon, Jennifer Stamey, Hannah Weeks in Treatment: 7 Encounter Discharge Information Items Post Procedure Vitals Discharge Condition: Stable Temperature (F): 97.7 Ambulatory Status: Ambulatory Pulse (bpm): 85 Discharge Destination: Home Respiratory Rate (breaths/min): 16 Transportation: Private Auto Blood Pressure (mmHg): 134/73 Accompanied By: wife Schedule Follow-up Appointment: Yes Clinical Summary of Care: Electronic Signature(s) Signed: 01/26/2022 4:55:50 PM By: [name], [name] RN Entered By: [name], Michael on 01/26/2022 15:58:33 -------------------------------------------------------------------------------- Lower Extremity Assessment Details Patient Name: Date of Service: [name], [name] 01/26/2022 3:15 PM Medical Record Number: [id number] Patient Account Number: [id number] Date of Birth/Sex: Treating RN: 04/16/1950 (71 y.o. M) [name], Michael Primary Care : Stamey, Hannah Other Clinician: Referring : Treating /Extender: Cannon, Jennifer Stamey, Hannah Weeks in Treatment: 7 Edema Assessment Assessed: [Left: No] [Right: No] Edema: [Left: Ye] [Right: s] Calf Left: Right: Point of Measurement:  35 cm From Medial Instep 37 cm Ankle Left: Right: Point of Measurement: 12 cm From Medial Instep 23 cm Vascular Assessment Pulses: Dorsalis Pedis Palpable: [Right:Yes] Electronic Signature(s) Signed: 01/26/2022 4:55:50 PM By: [name], [name] RN Entered By: [name], Michael on 01/26/2022 15:30:18 Multi Wound Chart Details -------------------------------------------------------------------------------- [name], Michael C ([id number]) [id number] Page 3 of 11 Patient Name: Date of Service: [name], [name] 01/26/2022 3:15 PM Medical Record Number: [id number] Patient Account Number: [id number] Date of Birth/Sex: Treating RN: 07/22/1950 (71 y.o. M) Primary Care : Stamey, Hannah Other Clinician: Referring : Treating /Extender: Cannon, Jennifer   Stamey, Hannah Weeks in Treatment: 7 Vital Signs Height(in): 72 Capillary Blood Glucose(mg/dl): 102 Weight(lbs): 208 Pulse(bpm): 85 Body Mass Index(BMI): 28.2 Blood Pressure(mmHg): 134/73 Temperature(°F): 97.7 Respiratory Rate(breaths/min): 18 Wound Assessments Wound Number: 3 5 6 Photos: Right, Anterior Lower Leg Right, Medial Upper Leg Right Groin Wound Location: Blister Surgical Injury Surgical Injury Wounding Event: Diabetic Wound/Ulcer of the Lower Diabetic Wound/Ulcer of the Lower Diabetic Wound/Ulcer of the Lower Primary Etiology: Extremity Extremity Extremity Chronic sinus problems/congestion, Chronic sinus problems/congestion, Chronic sinus problems/congestion, Comorbid History: Hypertension, Peripheral Arterial Hypertension, Peripheral Arterial Hypertension, Peripheral Arterial Disease, Peripheral Venous Disease, Disease, Peripheral Venous Disease, Disease, Peripheral Venous Disease, Type II Diabetes Type II Diabetes Type II Diabetes 09/30/2021 11/18/2021 11/18/2021 Date Acquired: 7 1 1 Weeks of Treatment: Open Open Healed - Epithelialized Wound Status: No No No Wound  Recurrence: 5x5x0.1 0.1x0.1x0.1 0x0x0 Measurements L x W x D (cm) 19.635 0.008 0 A (cm) : rea 1.963 0.001 0 Volume (cm) : -11.80% 99.50% 98.00% % Reduction in A rea: 62.70% 99.40% 97.40% % Reduction in Volume: Grade 2 Grade 1 Grade 1 Classification: Medium None Present None Present Exudate A mount: Serosanguineous N/A N/A Exudate Type: red, brown N/A N/A Exudate Color: Distinct, outline attached Flat and Intact Flat and Intact Wound Margin: Small (1-33%) Medium (34-66%) Medium (34-66%) Granulation A mount: Red, Pink N/A Red Granulation Quality: Large (67-100%) Medium (34-66%) Medium (34-66%) Necrotic A mount: Fat Layer (Subcutaneous Tissue): Yes Fat Layer (Subcutaneous Tissue): Yes Fat Layer (Subcutaneous Tissue): Yes Exposed Structures: Tendon: Yes Fascia: No Fascia: No Fascia: No Tendon: No Tendon: No Muscle: No Muscle: No Muscle: No Joint: No Joint: No Joint: No Bone: No Bone: No Small (1-33%) N/A Small (1-33%) Epithelialization: Debridement - Excisional N/A N/A Debridement: Pre-procedure Verification/Time Out 15:45 N/A N/A Taken: Lidocaine 4% Topical Solution N/A N/A Pain Control: Subcutaneous, Slough N/A N/A Tissue Debrided: Skin/Subcutaneous Tissue N/A N/A Level: 25 N/A N/A Debridement A (sq cm): rea Curette N/A N/A Instrument: Minimum N/A N/A Bleeding: Pressure N/A N/A Hemostasis A chieved: Procedure was tolerated well N/A N/A Debridement Treatment Response: 5x5x0.1 N/A N/A Post Debridement Measurements L x W x D (cm) 1.963 N/A N/A Post Debridement Volume: (cm) No Abnormalities Noted Periwound Skin Texture: Maceration: Yes Dry/Scaly: Yes Periwound Skin Moisture: Atrophie Blanche: No Periwound Skin Color: Cyanosis: No Erythema: No No Abnormality N/A N/A Temperature: Yes N/A N/A Tenderness on Palpation: Debridement N/A N/A Procedures Performed: Samek, Ahnaf C ([id number]) [id number] Page 4 of  11 Treatment Notes Wound #3 (Lower Leg) Wound Laterality: Right, Anterior Cleanser Peri-Wound Care Sween Lotion (Moisturizing lotion) Discharge Instruction: Apply moisturizing lotion as directed Topical Primary Dressing Promogran Prisma Matrix, 4.34 (sq in) (silver collagen) Discharge Instruction: Moisten collagen with saline or hydrogel Secondary Dressing ABD Pad, 8x10 Discharge Instruction: Apply over primary dressing as directed. Woven Gauze Sponge, Non-Sterile 4x4 in Discharge Instruction: Apply over primary dressing as directed. Secured With Kerlix Roll Sterile, 4.5x3.1 (in/yd) Discharge Instruction: Secure with Kerlix as directed. Stretch Net Size 5, 10 (yds) Compression Wrap Unnaboot w/Calamine, 4x10 (in/yd) Discharge Instruction: Apply Unnaboot to top of dressing and around ankles to prevent dressing from sliding Kerlix Roll 4.5x3.1 (in/yd) Discharge Instruction: Apply Kerlix and Coban compression as directed. Coban Self-Adherent Wrap 4x5 (in/yd) Discharge Instruction: Apply over Kerlix as directed. Compression Stockings Add-Ons Wound #5 (Upper Leg) Wound Laterality: Right, Medial Cleanser Soap and Water Discharge Instruction: May shower and wash wound with dial antibacterial soap and water prior to dressing change. Peri-Wound Care Topical Primary Dressing Promogran Prisma Matrix,   4.34 (sq in) (silver collagen) Discharge Instruction: Moisten collagen with saline or hydrogel Secondary Dressing ALLEVYN Gentle Border, 4x4 (in/in) Discharge Instruction: Apply over primary dressing as directed. Secured With Compression Wrap Compression Stockings Add-Ons Wound #6 (Groin) Wound Laterality: Right Cleanser Peri-Wound Care Topical Primary Dressing Secondary Dressing Secured With QUANTAY, ZAREMBA ([id number]) [id number] Page 5 of 11 Compression Wrap Compression Stockings Add-Ons Electronic Signature(s) Signed: 01/26/2022 3:59:33 PM By:  [name] Entered By: Fredirick Maudlin on 01/26/2022 15:59:32 -------------------------------------------------------------------------------- Multi-Disciplinary Care Plan Details Patient Name: Date of Service: HO [place], [name] 01/26/2022 3:15 PM Medical Record Number: [id number] Patient Account Number: [id number] Date of Birth/Sex: Treating RN: 04-19-1950 (71 y.o. Michael [name] Session Primary Care Wilian Kwong: [name] Other Clinician: Referring Jetson Pickrel: Treating Taft Worthing/Extender: [name] in Treatment: 7 Active Inactive Nutrition Nursing Diagnoses: Potential for alteratiion in Nutrition/Potential for imbalanced nutrition Goals: Patient/caregiver agrees to and verbalizes understanding of need to use nutritional supplements and/or vitamins as prescribed Date Initiated: 12/04/2021 Target Resolution Date: 01/28/2022 Goal Status: Active Patient/caregiver verbalizes understanding of need to maintain therapeutic glucose control per primary care physician Date Initiated: 12/04/2021 Target Resolution Date: 01/28/2022 Goal Status: Active Interventions: Assess patient nutrition upon admission and as needed per policy Provide education on elevated blood sugars and impact on wound healing Provide education on nutrition Treatment Activities: Education provided on Nutrition : 01/19/2022 Notes: Wound/Skin Impairment Nursing Diagnoses: Impaired tissue integrity Knowledge deficit related to ulceration/compromised skin integrity Goals: Patient/caregiver will verbalize understanding of skin care regimen Date Initiated: 12/04/2021 Target Resolution Date: 01/28/2022 Goal Status: Active Ulcer/skin breakdown will have a volume reduction of 30% by week 4 Date Initiated: 12/04/2021 Date Inactivated: 01/07/2022 Target Resolution Date: 12/26/2021 Goal Status: Met Ulcer/skin breakdown will have a volume reduction of 50% by week 8 Date Initiated:  01/07/2022 Target Resolution Date: 01/28/2022 Goal Status: Active Interventions: Assess ulceration(s) every visit Provide education on ulcer and skin care DAYSEN, GUNDRUM ([id number]) [id number] Page 6 of 11 Notes: Electronic Signature(s) Signed: 01/26/2022 4:55:50 PM By: [name] Entered By: Blanche East on 01/26/2022 15:41:14 -------------------------------------------------------------------------------- Pain Assessment Details Patient Name: Date of Service: HO [place], [name] 01/26/2022 3:15 PM Medical Record Number: [id number] Patient Account Number: [id number] Date of Birth/Sex: Treating RN: [birth year] (71 y.o. Michael [name] Session Primary Care Jazmynn Pho: [name] Other Clinician: Referring Natasa Stigall: Treating Andrik Sandt/Extender: [name] in Treatment: 7 Active Problems Location of Pain Severity and Description of Pain Patient Has Paino Yes Site Locations Rate the pain. Current Pain Level: 9 Pain Management and Medication Current Pain Management: Electronic Signature(s) Signed: 01/26/2022 4:55:50 PM By: [name] Entered By: Blanche East on 01/26/2022 15:28:27 -------------------------------------------------------------------------------- Patient/Caregiver Education Details Patient Name: Date of Service: [name], [place]. 11/27/2023andnbsp3:15 PM Medical Record Number: [id number] Patient Account Number: [id number] Date of Birth/Gender: Treating RN: Feb 01, 1951 (71 y.o. Michael [name] Session Primary Care Physician: [name] Other Clinician: Referring Physician: Treating Physician/Extender: [name] in Treatment: 7 Education [address] SANDRA, TELLEFSEN ([id number]) [id number] Page 7 of 11 Education Provided To: Patient Education Topics Provided Elevated Blood Sugar/ Impact on Healing: Methods: Explain/Verbal Responses:  Reinforcements needed, State content correctly Nutrition: Methods: Explain/Verbal Responses: Reinforcements needed Wound/Skin Impairment: Methods: Explain/Verbal Responses: Reinforcements needed, State content correctly Electronic Signature(s) Signed: 01/26/2022 4:55:50 PM By: [name] Entered By: Blanche East on 01/26/2022 15:41:39 -------------------------------------------------------------------------------- Wound Assessment Details Patient Name: Date of Service: HO [place], [place] C. 01/26/2022  3:15 PM Medical Record Number: [id number] Patient Account Number: [id number] Date of Birth/Sex: Treating RN: 10/20/1950 (71 y.o. M) [name], Michael Primary Care : Stamey, Hannah Other Clinician: Referring : Treating /Extender: Cannon, Jennifer Stamey, Hannah Weeks in Treatment: 7 Wound Status Wound Number: 3 Primary Diabetic Wound/Ulcer of the Lower Extremity Etiology: Wound Location: Right, Anterior Lower Leg Wound Open Wounding Event: Blister Status: Date Acquired: 09/30/2021 Comorbid Chronic sinus problems/congestion, Hypertension, Peripheral Weeks Of Treatment: 7 History: Arterial Disease, Peripheral Venous Disease, Type II Diabetes Clustered Wound: No Photos Wound Measurements Length: (cm) 5 Width: (cm) 5 Depth: (cm) 0.1 Area: (cm) 19.635 Volume: (cm) 1.963 % Reduction in Area: -11.8% % Reduction in Volume: 62.7% Epithelialization: Small (1-33%) Tunneling: No Undermining: No Wound Description Classification: Grade 2 Wound Margin: Distinct, outline attached Exudate Amount: Medium Exudate Type: Serosanguineous Exudate Color: red, brown Empie, Masahiro C ([id number]) Foul Odor After Cleansing: No Slough/Fibrino Yes [id number] Page 8 of 11 Wound Bed Granulation Amount: Small (1-33%) Exposed Structure Granulation Quality: Red, Pink Fascia Exposed: No Necrotic Amount: Large (67-100%) Fat Layer (Subcutaneous  Tissue) Exposed: Yes Necrotic Quality: Adherent Slough Tendon Exposed: Yes Muscle Exposed: No Joint Exposed: No Bone Exposed: No Periwound Skin Texture Texture Color No Abnormalities Noted: Yes No Abnormalities Noted: Yes Moisture Temperature / Pain No Abnormalities Noted: No Temperature: No Abnormality Maceration: Yes Tenderness on Palpation: Yes Treatment Notes Wound #3 (Lower Leg) Wound Laterality: Right, Anterior Cleanser Peri-Wound Care Sween Lotion (Moisturizing lotion) Discharge Instruction: Apply moisturizing lotion as directed Topical Primary Dressing Promogran Prisma Matrix, 4.34 (sq in) (silver collagen) Discharge Instruction: Moisten collagen with saline or hydrogel Secondary Dressing ABD Pad, 8x10 Discharge Instruction: Apply over primary dressing as directed. Woven Gauze Sponge, Non-Sterile 4x4 in Discharge Instruction: Apply over primary dressing as directed. Secured With Kerlix Roll Sterile, 4.5x3.1 (in/yd) Discharge Instruction: Secure with Kerlix as directed. Stretch Net Size 5, 10 (yds) Compression Wrap Unnaboot w/Calamine, 4x10 (in/yd) Discharge Instruction: Apply Unnaboot to top of dressing and around ankles to prevent dressing from sliding Kerlix Roll 4.5x3.1 (in/yd) Discharge Instruction: Apply Kerlix and Coban compression as directed. Coban Self-Adherent Wrap 4x5 (in/yd) Discharge Instruction: Apply over Kerlix as directed. Compression Stockings Add-Ons Electronic Signature(s) Signed: 01/26/2022 4:55:50 PM By: [name], [name] RN Entered By: [name], Michael on 01/26/2022 15:36:47 -------------------------------------------------------------------------------- Wound Assessment Details Patient Name: Date of Service: [name], [name] 01/26/2022 3:15 PM Medical Record Number: [id number] Patient Account Number: [id number] Date of Birth/Sex: Treating RN: 02/20/1951 (71 y.o. M) [name], Michael Primary Care : Stamey, Hannah Other  Clinician: Pittman, Mylen C ([id number]) [id number] Page 9 of 11 Referring : Treating /Extender: Cannon, Jennifer Stamey, Hannah Weeks in Treatment: 7 Wound Status Wound Number: 5 Primary Diabetic Wound/Ulcer of the Lower Extremity Etiology: Wound Location: Right, Medial Upper Leg Wound Open Wounding Event: Surgical Injury Status: Date Acquired: 11/18/2021 Comorbid Chronic sinus problems/congestion, Hypertension, Peripheral Weeks Of Treatment: 1 History: Arterial Disease, Peripheral Venous Disease, Type II Diabetes Clustered Wound: No Photos Wound Measurements Length: (cm) 0.1 Width: (cm) 0.1 Depth: (cm) 0.1 Area: (cm) 0.008 Volume: (cm) 0.001 % Reduction in Area: 99.5% % Reduction in Volume: 99.4% Tunneling: No Undermining: No Wound Description Classification: Grade 1 Wound Margin: Flat and Intact Exudate Amount: None Present Foul Odor After Cleansing: No Slough/Fibrino No Wound Bed Granulation Amount: Medium (34-66%) Exposed Structure Necrotic Amount: Medium (34-66%) Fascia Exposed: No Fat Layer (Subcutaneous Tissue) Exposed: Yes Tendon Exposed: No Muscle Exposed: No Joint Exposed: No Bone Exposed: No Periwound Skin Texture Texture Color No Abnormalities   Noted: No No Abnormalities Noted: No Moisture No Abnormalities Noted: No Treatment Notes Wound #5 (Upper Leg) Wound Laterality: Right, Medial Cleanser Soap and Water Discharge Instruction: May shower and wash wound with dial antibacterial soap and water prior to dressing change. Peri-Wound Care Topical Primary Dressing Promogran Prisma Matrix, 4.34 (sq in) (silver collagen) Discharge Instruction: Moisten collagen with saline or hydrogel Secondary Dressing ALLEVYN Gentle Border, 4x4 (in/in) Discharge Instruction: Apply over primary dressing as directed. Secured With LANIER, MILLON ([id number]) [id number] Page 10 of 11 Compression  Wrap Compression Stockings Add-Ons Electronic Signature(s) Signed: 01/26/2022 4:55:50 PM By: [name] Entered By: Blanche East on 01/26/2022 15:37:41 -------------------------------------------------------------------------------- Wound Assessment Details Patient Name: Date of Service: HO [place], [name] 01/26/2022 3:15 PM Medical Record Number: [id number] Patient Account Number: [id number] Date of Birth/Sex: Treating RN: [birth year] (71 y.o. Michael [name] Session Primary Care Jobani Sabado: [name] Other Clinician: Referring Salvatore Shear: Treating Dynasty Holquin/Extender: [name] in Treatment: 7 Wound Status Wound Number: 6 Primary Diabetic Wound/Ulcer of the Lower Extremity Etiology: Wound Location: Right Groin Wound Healed - Epithelialized Wounding Event: Surgical Injury Status: Date Acquired: 11/18/2021 Comorbid Chronic sinus problems/congestion, Hypertension, Peripheral Weeks Of Treatment: 1 History: Arterial Disease, Peripheral Venous Disease, Type II Diabetes Clustered Wound: No Photos Wound Measurements Length: (cm) Width: (cm) Depth: (cm) Area: (cm) Volume: (cm) 0 % Reduction in Area: 98% 0 % Reduction in Volume: 97.4% 0 Epithelialization: Small (1-33%) 0 Tunneling: No 0 Undermining: No Wound Description Classification: Grade 1 Wound Margin: Flat and Intact Exudate Amount: None Present Foul Odor After Cleansing: No Slough/Fibrino No Wound Bed Granulation Amount: Medium (34-66%) Exposed Structure Granulation Quality: Red Fascia Exposed: No Necrotic Amount: Medium (34-66%) Fat Layer (Subcutaneous Tissue) Exposed: Yes Tendon Exposed: No Muscle Exposed: No Joint Exposed: No Periwound Skin Texture Texture Color No Abnormalities Noted: No No Abnormalities Noted: No Moisture No Abnormalities Noted: No KAILEN, HINKLE ([id number]) [id number] Page 11 of 11 Dry / Scaly: Yes Treatment Notes Wound #6  (Groin) Wound Laterality: Right Cleanser Peri-Wound Care Topical Primary Dressing Secondary Dressing Secured With Compression Wrap Compression Stockings Add-Ons Electronic Signature(s) Signed: 01/26/2022 4:55:50 PM By: [name] Entered By: Blanche East on 01/26/2022 15:50:00 -------------------------------------------------------------------------------- Vitals Details Patient Name: Date of Service: HO [place], [place] C. 01/26/2022 3:15 PM Medical Record Number: [id number] Patient Account Number: [id number] Date of Birth/Sex: Treating RN: 12/28/1950 (71 y.o. Michael [name] Session Primary Care Emila Steinhauser: [name] Other Clinician: Referring Esa Raden: Treating Deanie Jupiter/Extender: [name] in Treatment: 7 Vital Signs Time Taken: 15:24 Temperature (F): 97.7 Height (in): 72 Pulse (bpm): 85 Weight (lbs): 208 Respiratory Rate (breaths/min): 18 Body Mass Index (BMI): 28.2 Blood Pressure (mmHg): 134/73 Capillary Blood Glucose (mg/dl): 102 Reference Range: 80 - 120 mg / dl Electronic Signature(s) Signed: 01/26/2022 4:55:50 PM By: [name] Entered By: Blanche East on 01/26/2022 15:28:15

## 2022-02-02 ENCOUNTER — Encounter (HOSPITAL_BASED_OUTPATIENT_CLINIC_OR_DEPARTMENT_OTHER): Payer: Medicare Other | Attending: General Surgery | Admitting: General Surgery

## 2022-02-02 DIAGNOSIS — L97815 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: Secondary | ICD-10-CM | POA: Diagnosis not present

## 2022-02-02 DIAGNOSIS — E1122 Type 2 diabetes mellitus with diabetic chronic kidney disease: Secondary | ICD-10-CM | POA: Diagnosis not present

## 2022-02-02 DIAGNOSIS — L97122 Non-pressure chronic ulcer of left thigh with fat layer exposed: Secondary | ICD-10-CM | POA: Diagnosis not present

## 2022-02-02 DIAGNOSIS — Z8546 Personal history of malignant neoplasm of prostate: Secondary | ICD-10-CM | POA: Insufficient documentation

## 2022-02-02 DIAGNOSIS — I70239 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: Secondary | ICD-10-CM | POA: Insufficient documentation

## 2022-02-02 DIAGNOSIS — E11622 Type 2 diabetes mellitus with other skin ulcer: Secondary | ICD-10-CM | POA: Insufficient documentation

## 2022-02-02 DIAGNOSIS — I129 Hypertensive chronic kidney disease with stage 1 through stage 4 chronic kidney disease, or unspecified chronic kidney disease: Secondary | ICD-10-CM | POA: Diagnosis not present

## 2022-02-02 DIAGNOSIS — E785 Hyperlipidemia, unspecified: Secondary | ICD-10-CM | POA: Diagnosis not present

## 2022-02-02 DIAGNOSIS — E1151 Type 2 diabetes mellitus with diabetic peripheral angiopathy without gangrene: Secondary | ICD-10-CM | POA: Diagnosis not present

## 2022-02-02 DIAGNOSIS — L4050 Arthropathic psoriasis, unspecified: Secondary | ICD-10-CM | POA: Insufficient documentation

## 2022-02-02 NOTE — Progress Notes (Addendum)
Michael [name], Michael [name] ([id number]) [id number] Page 1 of 10 Visit Report for 02/02/2022 Chief Complaint Document Details Patient Name: Date of Service: HO FFMA [place], [name] 02/02/2022 8:45 A M Medical Record Number: [id number] Patient Account Number: [id number] Date of Birth/Sex: Treating RN: [birth year] (71 y.o. M) Primary Care Provider: [name] Other Clinician: Referring Provider: Treating Provider/Extender: [name] in Treatment: 8 Information Obtained from: Patient Chief Complaint Patient presents to the wound care center today with an open arterial ulcer to the right lower extremity in the setting of diabetes mellitus. he has had this problem for 7 months 12/04/2021: ulcer to right lower anterior tibial surface Electronic Signature(s) Signed: 02/02/2022 9:03:27 AM By: [name] Previous Signature: 02/02/2022 8:50:09 AM Version By: [name] Entered By: Fredirick Maudlin on 02/02/2022 09:03:27 -------------------------------------------------------------------------------- Debridement Details Patient Name: Date of Service: [name], [place] C. 02/02/2022 8:45 A M Medical Record Number: [id number] Patient Account Number: [id number] Date of Birth/Sex: Treating RN: [birth year] (71 y.o. [name] Primary Care Provider: [name] Other Clinician: Referring Provider: Treating Provider/Extender: [name] in Treatment: 8 Debridement Performed for Assessment: Wound #3 Right,Anterior Lower Leg Performed By: Physician Fredirick Maudlin, MD Debridement Type: Debridement Severity of Tissue Pre Debridement: Fat layer exposed Level of Consciousness (Pre-procedure): Awake and Alert Pre-procedure Verification/Time Out Yes - 08:58 Taken: Start Time: 08:58 Pain Control: Lidocaine 5% topical ointment T Area Debrided (L x W): otal 4.3 (cm) x 4 (cm) = 17.2 (cm) Tissue  and other material debrided: Non-Viable, Slough, Slough Level: Non-Viable Tissue Debridement Description: Selective/Open Wound Instrument: Curette Bleeding: Minimum Hemostasis Achieved: Pressure Response to Treatment: Procedure was tolerated well Level of Consciousness (Post- Awake and Alert procedure): Post Debridement Measurements of Total Wound Length: (cm) 4.3 Width: (cm) 4 Depth: (cm) 0.1 Volume: (cm) 1.351 Character of Wound/Ulcer Post Debridement: Improved Severity of Tissue Post Debridement: Fat layer exposed Michael [name], Michael [name] C ([id number]) [id number] Page 2 of 10 Post Procedure Diagnosis Same as Pre-procedure Notes scribed for Dr. Celine Ahr by Adline Peals, RN Electronic Signature(s) Signed: 02/02/2022 9:19:47 AM By: [name] Signed: 02/02/2022 4:56:57 PM By: Adline Peals Entered By: Adline Peals on 02/02/2022 09:00:14 -------------------------------------------------------------------------------- HPI Details Patient Name: Date of Service: HO [place], [place]. 02/02/2022 8:45 A M Medical Record Number: [id number] Patient Account Number: [id number] Date of Birth/Sex: Treating RN: 08-18-1950 (71 y.o. M) Primary Care Provider: [name] Other Clinician: Referring Provider: Treating Provider/Extender: [name] in Treatment: 8 History of Present Illness Location: right lateral calf closer to the knee Quality: Patient reports experiencing a dull pain to affected area(s). Severity: Patient states wound(s) are getting worse. Duration: Patient has had the wound for > 7 months prior to seeking treatment at the wound center Timing: Pain in wound is constant (hurts all the time) Context: The wound would happen gradually Modifying Factors: Patient wound(s)/ulcer(s) are worsening due to : no resolution and a white material at the base of the wound ssociated Signs and Symptoms: Patient  reports having:no discharge or purulent material A HPI Description: 71 year old gentleman who has been referred to was from his PCP for a chronic ulceration on his right lower extremity which she's had for several weeks. Past medical history significant for diabetes mellitus type 2, hypertension, hyperlipidemia, anxiety, obesity, peripheral vascular disease and chronic kidney disease. He has never been a smoker. Most recent lab work done at his PCPs office showed a glucose of  217 milligrams per deciliter which is consistent with hyperglycemia. In January 2017 recent hemoglobin A1c values noted to be 8.2%. In April 2017, he has been seen at the vascular office by Dr. Trula Slade and Dr. Kellie Simmering for right leg claudication. He had a right superficial femoral artery stent in September 2012. Recent noninvasive vascular imaging done on 06/24/2015 showed a right ABI of 1.07 with a triphasic waveform and a right TBI of 0.81. The left was noncompressible with a triphasic waveform and a TBI of 1.17. Right lower extremity arterial duplex was unable to identify stent exit but they were elevated velocities at the stent and in the distal thigh with triphasic waveforms. Dr. Stephens Shire opinion was to return to the clinic in 6 months with ABI and right lower extremity arterial duplex studies to be done. He has seen a dermatologist who had done a biopsy and said it was benign and he was given a steroid cream. 10/23/2015 -- Pathology report of the biopsy done in April 2017 shows ulcer with underlying angiodermatitis consistent with stasis dermatitis. 10/30/2015 -- he is going for shoulder surgery in about 10 days' time and was asking about the perioperative care. His blood sugars are running in the high 100s or low 200s. No hemoglobin A1c done recently. 11/20/2015 -- is back up to 2 weeks because of recent left shoulder surgery. 12/04/15; patient returns today with the wound for the most part looking healthy. No evidence of  infection no debridement required. He is using Prisma for 4 weeks, without obvious improvement per her intake nurse. This started as several small open areas that were raised erythematous. He saw dermatology and at some point this was biopsied that just suggested stasis skin physiology. This certainly doesn't look like that 12/11/15; using Hydrafera Blue. Previous biopsy reviewed, no atypia PAS negative. He has a history of stents in the right leg however he does not appear to have primary arterial insufficiency ABI in this clinic was over 0.9. 12/25/2015 -- he has been approved for grafix and we will order for some to be applied next week 01/01/2016 -- he has had his first application of Grafix today 01/08/2016 -- he has had his second application of Grafix today READMISSION 12/04/2021 This is a now 71 year old man who was followed in our clinic about 5 years ago for an ulceration on his right lateral lower leg, just distal to the knee. He has since undergone a number of revascularization procedures that have been complicated by restenosis and wound infections. During the course of this treatment, he developed a small ulcer on his right anterior tibial surface. Despite use of an Unna boot, the wound has continued to expand. He was referred to the wound care center by Dr. Trula Slade for further evaluation and management. On the right anterior tibial surface, there is an irregular wound with heavy slough and eschar accumulation. The periwound skin is intact but he does have 1-2+ pitting edema. There is no purulent drainage or malodor. 10/13; second visit for this man who has an ischemic wound in the setting of type 2 diabetes on the right anterior mid tibia area. He has been revascularized. Using Santyl gent 12/22/2021: The wound is about the same size this week. There is a lot of gray sloughy material on the surface, secondary to silver nitrate used to stop bleeding after what sounds like a fairly  aggressive debridement last week. 12/30/2021: The wound is smaller this week and a bit cleaner. Edema control is excellent. There is still  a bit of slough accumulation. Michael [name], Michael [name] ([id number]) [id number] Page 3 of 10 01/07/2022: The wound continues to contract and is much cleaner this week. Edema control is very good. He has small openings in his upper leg surgical scars, but he is going to be seeing Dr. Trula Slade next Monday and will have him take a look. He has been applying manuka honey to both of the sites. We have been using Santyl and gentamicin on his lower leg wound. 01/14/2022: He saw vascular surgery this week and was told that they were happy to have [place] manage the 2 wounds from his operation. Both of these have a light layer of slough on the surface. The more distal wound is a little bit dry. The anterior tibial wound continues to accumulate a fair amount of slough. Edema control is good. 01/19/2022: Both upper leg wounds are smaller. The more distal is quite dry and cracked when I was examining it. The lower leg wound is more painful and the surrounding tissue is erythematous and indurated. 01/26/2022: The upper leg wounds are healed. The skin at the distal leg wound is quite dry. The lower leg wound is cleaner but still fairly painful. His wrap slid again this week. 02/02/2022: We had good success keeping his wrap intact using a standard Unna boot. The lower leg wound continues to contract and is clean and less painful this week. Electronic Signature(s) Signed: 02/02/2022 9:03:55 AM By: [name] Entered By: Fredirick Maudlin on 02/02/2022 09:03:55 -------------------------------------------------------------------------------- Physical Exam Details Patient Name: Date of Service: [name], [place] C. 02/02/2022 8:45 A M Medical Record Number: [id number] Patient Account Number: [id number] Date of Birth/Sex: Treating RN: [birth year] (71 y.o.  M) Primary Care Provider: [name] Other Clinician: Referring Provider: Treating Provider/Extender: [name] in Treatment: 8 Constitutional Slightly hypertensive. . . . No acute distress. Respiratory Normal work of breathing on room air. Notes 02/02/2022: The lower leg wound continues to contract and is clean and less painful this week. Electronic Signature(s) Signed: 02/02/2022 9:05:23 AM By: [name] Entered By: Fredirick Maudlin on 02/02/2022 09:05:22 -------------------------------------------------------------------------------- Physician Orders Details Patient Name: Date of Service: HO [place], [place] C. 02/02/2022 8:45 A M Medical Record Number: [id number] Patient Account Number: [id number] Date of Birth/Sex: Treating RN: Dec 08, 1950 (71 y.o. [name] Primary Care Provider: [name] Other Clinician: Referring Provider: Treating Provider/Extender: [name] in Treatment: 8 Verbal / Phone Orders: No Diagnosis Coding ICD-10 Coding Code Description [phone] 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 I70.239 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 Michael [name], Michael [name] ([id number]) [id number] Page 4 of 10 E11.622 Type 2 diabetes mellitus with other skin ulcer I73.9 Peripheral vascular disease, unspecified Follow-up Appointments ppointment in 1 week. - Dr. Celine Ahr - room 1 or 4 Return A Anesthetic Wound #3 Right,Anterior Lower Leg (In clinic) Topical Lidocaine 5% applied to wound bed Bathing/ Shower/ Hygiene May shower with protection but do not get wound dressing(s) wet. - purchase cast protector from [facility], [facility] or [employer] Edema Control - Lymphedema / SCD / Other Right Lower Extremity Elevate legs to the level of the heart or above for 30  minutes daily and/or when sitting, a frequency of: Avoid standing for long periods of time. Wound Treatment Wound #3 - Lower Leg Wound Laterality: Right, Anterior Cleanser: Soap and Water 1 x Per Week/30  Days Discharge Instructions: May shower and wash wound with dial antibacterial soap and water prior to dressing change. Cleanser: Wound Cleanser 1 x Per Week/30 Days Discharge Instructions: Cleanse the wound with wound cleanser prior to applying a clean dressing using gauze sponges, not tissue or cotton balls. Peri-Wound Care: Sween Lotion (Moisturizing lotion) 1 x Per Week/30 Days Discharge Instructions: Apply moisturizing lotion as directed Prim Dressing: Promogran Prisma Matrix, 4.34 (sq in) (silver collagen) 1 x Per Week/30 Days ary Discharge Instructions: Moisten collagen with saline or hydrogel Secondary Dressing: ABD Pad, 8x10 1 x Per Week/30 Days Discharge Instructions: Apply over primary dressing as directed. Secondary Dressing: Woven Gauze Sponge, Non-Sterile 4x4 in 1 x Per Week/30 Days Discharge Instructions: Apply over primary dressing as directed. Secured With: [employer] Size 5, 10 (yds) 1 x Per Week/30 Days Compression Wrap: Unnaboot w/Calamine, 4x10 (in/yd) 1 x Per Week/30 Days Discharge Instructions: Apply Unnaboot to top of dressing and around ankles to prevent dressing from sliding Compression Wrap: Kerlix Roll 4.5x3.1 (in/yd) 1 x Per Week/30 Days Discharge Instructions: Apply Kerlix and Coban compression as directed. Compression Wrap: Coban Self-Adherent Wrap 4x5 (in/yd) 1 x Per Week/30 Days Discharge Instructions: Apply over Kerlix as directed. Patient Medications llergies: Actos, Demerol, glimepiride, nabumetone, oxycodone HCl, penicillin A Notifications Medication Indication Start End 02/02/2022 lidocaine DOSE topical 5 % ointment - ointment topical Electronic Signature(s) Signed: 02/02/2022 9:19:47 AM By: [name] Entered By: Fredirick Maudlin on  02/02/2022 09:05:40 -------------------------------------------------------------------------------- Problem List Details Patient Name: Date of Service: [name], [place] C. 02/02/2022 8:45 A M Medical Record Number: [id number] Patient Account Number: [id number] COTY, LARSH ([id number]) [id number] Page 5 of 10 Date of Birth/Sex: Treating RN: 29-Jun-1950 (71 y.o. M) Primary Care Provider: [name] Other Clinician: Referring Provider: Treating Provider/Extender: [name] in Treatment: 8 Active Problems ICD-10 Encounter Code Description Active Date MDM Diagnosis L97.815 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 12/04/2021 No Yes involvement without evidence of necrosis I70.239 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 12/04/2021 No Yes E11.622 Type 2 diabetes mellitus with other skin ulcer 12/04/2021 No Yes I73.9 Peripheral vascular disease, unspecified 12/04/2021 No Yes L97.122 Non-pressure chronic ulcer of left thigh with fat layer exposed 01/14/2022 No Yes Inactive Problems Resolved Problems Electronic Signature(s) Signed: 02/02/2022 8:49:45 AM By: [name] Entered By: Fredirick Maudlin on 02/02/2022 08:49:45 -------------------------------------------------------------------------------- Progress Note Details Patient Name: Date of Service: [name], [place] C. 02/02/2022 8:45 A M Medical Record Number: [id number] Patient Account Number: [id number] Date of Birth/Sex: Treating RN: Sep 21, 1950 (71 y.o. M) Primary Care Provider: [name] Other Clinician: Referring Provider: Treating Provider/Extender: [name] in Treatment: 8 Subjective Chief Complaint Information obtained from Patient Patient presents to the wound care center today with an open arterial ulcer to the right lower extremity in the  setting of diabetes mellitus. he has had this problem for 7 months 12/04/2021: ulcer to right lower anterior tibial surface History of Present Illness (HPI) The following HPI elements were documented for the patient's wound: Location: right lateral calf closer to the knee Quality: Patient reports experiencing a dull pain to affected area(s). Severity: Patient states wound(s) are getting worse. Duration: Patient has had the wound for > 7 months prior to seeking treatment at the wound center Timing: Pain in wound is constant (hurts all the time) Context: The wound would happen gradually Modifying Factors: Patient  wound(s)/ulcer(s) are worsening due to : no resolution and a white material at the base of the wound Associated Signs and Symptoms: Patient reports having:no discharge or purulent material 71 year old gentleman who has been referred to was from his PCP for a chronic ulceration on his right lower extremity which she's had for several weeks. Past Michael [name], Michael [name] ([id number]) [id number] Page 6 of 10 medical history significant for diabetes mellitus type 2, hypertension, hyperlipidemia, anxiety, obesity, peripheral vascular disease and chronic kidney disease. He has never been a smoker. Most recent lab work done at his PCPs office showed a glucose of 217 milligrams per deciliter which is consistent with hyperglycemia. In January 2017 recent hemoglobin A1c values noted to be 8.2%. In April 2017, he has been seen at the vascular office by Dr. Trula Slade and Dr. Kellie Simmering for right leg claudication. He had a right superficial femoral artery stent in September 2012. Recent noninvasive vascular imaging done on 06/24/2015 showed a right ABI of 1.07 with a triphasic waveform and a right TBI of 0.81. The left was noncompressible with a triphasic waveform and a TBI of 1.17. Right lower extremity arterial duplex was unable to identify stent exit but they were elevated velocities  at the stent and in the distal thigh with triphasic waveforms. Dr. Stephens Shire opinion was to return to the clinic in 6 months with ABI and right lower extremity arterial duplex studies to be done. He has seen a dermatologist who had done a biopsy and said it was benign and he was given a steroid cream. 10/23/2015 -- Pathology report of the biopsy done in April 2017 shows ulcer with underlying angiodermatitis consistent with stasis dermatitis. 10/30/2015 -- he is going for shoulder surgery in about 10 days' time and was asking about the perioperative care. His blood sugars are running in the high 100s or low 200s. No hemoglobin A1c done recently. 11/20/2015 -- is back up to 2 weeks because of recent left shoulder surgery. 12/04/15; patient returns today with the wound for the most part looking healthy. No evidence of infection no debridement required. He is using Prisma for 4 weeks, without obvious improvement per her intake nurse. This started as several small open areas that were raised erythematous. He saw dermatology and at some point this was biopsied that just suggested stasis skin physiology. This certainly doesn't look like that 12/11/15; using Hydrafera Blue. Previous biopsy reviewed, no atypia PAS negative. He has a history of stents in the right leg however he does not appear to have primary arterial insufficiency ABI in this clinic was over 0.9. 12/25/2015 -- he has been approved for grafix and we will order for some to be applied next week 01/01/2016 -- he has had his first application of Grafix today 01/08/2016 -- he has had his second application of Grafix today READMISSION 12/04/2021 This is a now 71 year old man who was followed in our clinic about 5 years ago for an ulceration on his right lateral lower leg, just distal to the knee. He has since undergone a number of revascularization procedures that have been complicated by restenosis and wound infections. During the course of  this treatment, he developed a small ulcer on his right anterior tibial surface. Despite use of an Unna boot, the wound has continued to expand. He was referred to the wound care center by Dr. Trula Slade for further evaluation and management. On the right anterior tibial surface, there is an irregular wound with heavy slough and eschar accumulation. The periwound skin is intact  but he does have 1-2+ pitting edema. There is no purulent drainage or malodor. 10/13; second visit for this man who has an ischemic wound in the setting of type 2 diabetes on the right anterior mid tibia area. He has been revascularized. Using Santyl gent 12/22/2021: The wound is about the same size this week. There is a lot of gray sloughy material on the surface, secondary to silver nitrate used to stop bleeding after what sounds like a fairly aggressive debridement last week. 12/30/2021: The wound is smaller this week and a bit cleaner. Edema control is excellent. There is still a bit of slough accumulation. 01/07/2022: The wound continues to contract and is much cleaner this week. Edema control is very good. He has small openings in his upper leg surgical scars, but he is going to be seeing Dr. Trula Slade next Monday and will have him take a look. He has been applying manuka honey to both of the sites. We have been using Santyl and gentamicin on his lower leg wound. 01/14/2022: He saw vascular surgery this week and was told that they were happy to have [place] manage the 2 wounds from his operation. Both of these have a light layer of slough on the surface. The more distal wound is a little bit dry. The anterior tibial wound continues to accumulate a fair amount of slough. Edema control is good. 01/19/2022: Both upper leg wounds are smaller. The more distal is quite dry and cracked when I was examining it. The lower leg wound is more painful and the surrounding tissue is erythematous and indurated. 01/26/2022: The upper leg wounds  are healed. The skin at the distal leg wound is quite dry. The lower leg wound is cleaner but still fairly painful. His wrap slid again this week. 02/02/2022: We had good success keeping his wrap intact using a standard Unna boot. The lower leg wound continues to contract and is clean and less painful this week. Patient History Information obtained from Patient. Family History Cancer - Siblings, Heart Disease - Father, Hypertension - Father,Mother, No family history of Hereditary Spherocytosis, Kidney Disease, Seizures, Stroke, Thyroid Problems, Tuberculosis. Social History Former smoker - smokeless tobacco, Marital Status - Married, Alcohol Use - Rarely - WINE, Drug Use - No History, Caffeine Use - Daily - COFFEE. Medical History Ear/Nose/Mouth/Throat Patient has history of Chronic sinus problems/congestion - seasonal allergies Cardiovascular Patient has history of Hypertension, Peripheral Arterial Disease - STENTS, Peripheral Venous Disease Endocrine Patient has history of Type II Diabetes - last A1c- 8.2 Hospitalization/Surgery History - left shoulder surgery. Medical A Surgical History Notes nd Constitutional Symptoms (General Health) obesity , h/o (R) leg claudication (right fem stent September 2012) Cardiovascular hyperlipidemia Endocrine pt. on diet intentionally losing 20 pounds since December 2016 in an effort to improve A1C levels Integumentary (Skin) psoriatic arthritis Musculoskeletal bilat knee pain identified as an ortho issue psoriatic arthritis Michael [name], Michael [name] ([id number]) [id number] Page 7 of 10 Oncologic Prostate cancer 2018 Psychiatric anxiety Objective Constitutional Slightly hypertensive. No acute distress. Vitals Time Taken: 8:43 AM, Height: 72 in, Weight: 208 lbs, BMI: 28.2, Temperature: 97.5 F, Pulse: 92 bpm, Respiratory Rate: 16 breaths/min, Blood Pressure: 149/75 mmHg. Respiratory Normal work of breathing on room  air. General Notes: 02/02/2022: The lower leg wound continues to contract and is clean and less painful this week. Integumentary (Hair, Skin) Wound #3 status is Open. Original cause of wound was Blister. The date acquired was: 09/30/2021. The wound has been in treatment 8 weeks. The wound  is located on the Right,Anterior Lower Leg. The wound measures 4.3cm length x 4cm width x 0.1cm depth; 13.509cm^2 area and 1.351cm^3 volume. There is tendon and Fat Layer (Subcutaneous Tissue) exposed. There is a medium amount of serosanguineous drainage noted. The wound margin is distinct with the outline attached to the wound base. There is small (1-33%) red, pink granulation within the wound bed. There is a large (67-100%) amount of necrotic tissue within the wound bed including Adherent Slough. The periwound skin appearance had no abnormalities noted for texture. The periwound skin appearance had no abnormalities noted for color. The periwound skin appearance exhibited: Maceration. Periwound temperature was noted as No Abnormality. The periwound has tenderness on palpation. Wound #5 status is Healed - Epithelialized. Original cause of wound was Surgical Injury. The date acquired was: 11/18/2021. The wound has been in treatment 2 weeks. The wound is located on the Right,Medial Upper Leg. The wound measures 0cm length x 0cm width x 0cm depth; 0cm^2 area and 0cm^3 volume. There is no tunneling or undermining noted. There is a none present amount of drainage noted. The wound margin is flat and intact. There is no granulation within the wound bed. There is no necrotic tissue within the wound bed. 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 There was a Selective/Open Wound Non-Viable Tissue Debridement with a total area of 17.2 sq cm performed by Fredirick Maudlin, MD. With the following instrument(s): Curette to remove Non-Viable tissue/material. Material removed includes [facility] after achieving pain control using Lidocaine 5% topical ointment. No specimens were taken. A time out was conducted at 08:58, prior to the start of the procedure. A Minimum amount of bleeding was controlled with Pressure. The procedure was tolerated well. Post Debridement Measurements: 4.3cm length x 4cm width x 0.1cm depth; 1.351cm^3 volume. Character of Wound/Ulcer Post Debridement is improved. Severity of Tissue Post Debridement is: Fat layer exposed. Post procedure Diagnosis Wound #3: Same as Pre-Procedure General Notes: scribed for Dr. Celine Ahr by Adline Peals, RN. Plan Follow-up Appointments: Return Appointment in 1 week. - Dr. Celine Ahr - room 1 or 4 Anesthetic: Wound #3 Right,Anterior Lower Leg: (In clinic) Topical Lidocaine 5% applied to wound bed Michael [name], Michael [name] ([id number]) [id number] Page 8 of 10 Bathing/ Shower/ Hygiene: May shower with protection but do not get wound dressing(s) wet. - purchase cast protector from [facility], [facility] or [employer] Edema Control - Lymphedema / SCD / Other: Elevate legs to the level of the heart or above for 30 minutes daily and/or when sitting, a frequency of: Avoid standing for long periods of time. The following medication(s) was prescribed: lidocaine topical 5 % ointment ointment topical was prescribed at facility WOUND #3: - Lower Leg Wound Laterality: Right, Anterior Cleanser: Soap and Water 1 x Per  Week/30 Days Discharge Instructions: May shower and wash wound with dial antibacterial soap and water prior to dressing change. Cleanser: Wound Cleanser 1 x Per Week/30 Days Discharge Instructions: Cleanse the wound with wound cleanser prior to applying a clean dressing using gauze sponges, not tissue or cotton  balls. Peri-Wound Care: Sween Lotion (Moisturizing lotion) 1 x Per Week/30 Days Discharge Instructions: Apply moisturizing lotion as directed Prim Dressing: Promogran Prisma Matrix, 4.34 (sq in) (silver collagen) 1 x Per Week/30 Days ary Discharge Instructions: Moisten collagen with saline or hydrogel Secondary Dressing: ABD Pad, 8x10 1 x Per Week/30 Days Discharge Instructions: Apply over primary dressing as directed. Secondary Dressing: Woven Gauze Sponge, Non-Sterile 4x4 in 1 x Per Week/30 Days Discharge Instructions: Apply over primary dressing as directed. Secured With: [employer] Size 5, 10 (yds) 1 x Per Week/30 Days Com pression Wrap: Unnaboot w/Calamine, 4x10 (in/yd) 1 x Per Week/30 Days Discharge Instructions: Apply Unnaboot to top of dressing and around ankles to prevent dressing from sliding Com pression Wrap: Kerlix Roll 4.5x3.1 (in/yd) 1 x Per Week/30 Days Discharge Instructions: Apply Kerlix and Coban compression as directed. Com pression Wrap: Coban Self-Adherent Wrap 4x5 (in/yd) 1 x Per Week/30 Days Discharge Instructions: Apply over Kerlix as directed. 02/02/2022: The lower leg wound continues to contract and is clean and less painful this week. I used a curette to debride slough from the wound surface. We will continue Santyl with an Haematologist. We are awaiting insurance verification for TheraSkin and Apligraf. Follow-up in 1 week. Electronic Signature(s) Signed: 02/02/2022 9:06:36 AM By: [name] Entered By: Fredirick Maudlin on 02/02/2022 09:06:36 -------------------------------------------------------------------------------- HxROS Details Patient  Name: Date of Service: [name], [place] C. 02/02/2022 8:45 A M Medical Record Number: [id number] Patient Account Number: [id number] Date of Birth/Sex: Treating RN: 12/02/1950 (71 y.o. M) Primary Care Provider: [name] Other Clinician: Referring Provider: Treating Provider/Extender: [name] in Treatment: 8 Information Obtained From Patient Constitutional Symptoms (General Health) Medical History: Past Medical History Notes: obesity , h/o (R) leg claudication (right fem stent September 2012) Ear/Nose/Mouth/Throat Medical History: Positive for: Chronic sinus problems/congestion - seasonal allergies Cardiovascular Medical History: Positive for: Hypertension; Peripheral Arterial Disease - STENTS; Peripheral Venous Disease Past Medical History Notes: hyperlipidemia Endocrine Michael [name], Michael [name] ([id number]) [id number] Page 9 of 10 Medical History: Positive for: Type II Diabetes - last A1c- 8.2 Past Medical History Notes: pt. on diet intentionally losing 20 pounds since December 2016 in an effort to improve A1C levels Time with diabetes: 9 YRS Treated with: Insulin Blood sugar tested every day: Yes Tested : 7-8 TIMES Integumentary (Skin) Medical History: Past Medical History Notes: psoriatic arthritis Musculoskeletal Medical History: Past Medical History Notes: bilat knee pain identified as an ortho issue psoriatic arthritis Oncologic Medical History: Past Medical History Notes: Prostate cancer 2018 Psychiatric Medical History: Past Medical History Notes: anxiety HBO Extended History Items Ear/Nose/Mouth/Throat: Chronic sinus problems/congestion Immunizations Pneumococcal Vaccine: Received Pneumococcal Vaccination: Yes Received Pneumococcal Vaccination On or After 60th Birthday: Yes Implantable Devices None Hospitalization / Surgery History Type of Hospitalization/Surgery left shoulder surgery Family  and Social History Cancer: Yes - Siblings; Heart Disease: Yes - Father; Hereditary Spherocytosis: No; Hypertension: Yes - Father,Mother; Kidney Disease: No; Seizures: No; Stroke: No; Thyroid Problems: No; Tuberculosis: No; Former smoker - smokeless tobacco; Marital Status - Married; Alcohol Use: Rarely - WINE; Drug Use: No History; Caffeine Use: Daily - COFFEE; Financial Concerns: No; Food, Clothing or Shelter Needs: No; Support System Lacking: No; Transportation Concerns: No Electronic Signature(s) Signed: 02/02/2022 9:19:47 AM By: [name] Entered By: Fredirick Maudlin on 02/02/2022 09:04:02 -------------------------------------------------------------------------------- SuperBill Details Patient Name: Date of Service: [name], [place] C. 02/02/2022 Medical Record Number: [id number] Patient Account Number: [id number] Date of Birth/Sex: Treating RN:  05-Dec-1950 (71 y.o. 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 E11.622 Type 2 diabetes mellitus with other skin ulcer I73.9 Peripheral vascular disease, unspecified L97.122 Non-pressure chronic ulcer of left thigh with fat layer exposed Facility Procedures : CPT4 Code Description: [id number] - DEBRIDE WOUND 1ST 20 SQ CM OR < ICD-10 Diagnosis Description L97.815 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 involvemen Modifier: t without evidence Quantity: 1 of necrosis Physician Procedures : CPT4 Code Description Modifier [id number] - WC  PHYS LEVEL 4 - EST PT 25 ICD-10 Diagnosis Description L97.815 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

## 2022-02-04 NOTE — Progress Notes (Signed)
YAMAN, GRAUBERGER ([id number]) [id number] Page 1 of 9 Visit Report for 02/02/2022 Arrival Information Details Patient Name: Date of Service: Michael [name], [name] 02/02/2022 8:45 A M Medical Record Number: [id number] Patient Account Number: [id number] Date of Birth/Sex: Treating RN: 02-24-1951 (71 y.o. Michael [name] Primary Care Michael [name]: Michael [name] Other Clinician: Referring Michael [name]: Treating Michael [name]/Extender: Michael [name] in Treatment: 8 Visit Information History Since Last Visit Added or deleted any medications: No Patient Arrived: Ambulatory Any new allergies or adverse reactions: No Arrival Time: 08:42 Had a fall or experienced change in No Accompanied By: spouse activities of daily living that may affect Transfer Assistance: None risk of falls: Patient Identification Verified: Yes Signs or symptoms of abuse/neglect since last visito No Patient Requires Transmission-Based Precautions: No Hospitalized since last visit: No Patient Has Alerts: Yes Implantable device outside of the clinic excluding No Patient Alerts: Patient on Blood Thinner cellular tissue based products placed in the center plavix since last visit: Has Dressing in Place as Prescribed: Yes Has Compression in Place as Prescribed: Yes Pain Present Now: Yes Electronic Signature(s) Signed: 02/03/2022 4:45:09 PM By: Michael Catholic RN Entered By: Michael [name] on 02/02/2022 08:43:28 -------------------------------------------------------------------------------- Encounter Discharge Information Details Patient Name: Date of Service: Michael [name], Michael [name] 02/02/2022 8:45 A M Medical Record Number: [id number] Patient Account Number: [id number] Date of Birth/Sex: Treating RN: 1950/09/06 (71 y.o. Michael [name] Primary Care Michael [name]: Michael [name] Other Clinician: Referring Michael [name]: Treating Michael [name]/Extender: Michael [name] in Treatment: 8 Encounter Discharge Information Items Post Procedure Vitals Discharge Condition: Stable Temperature (F): 97.5 Ambulatory Status: Ambulatory Pulse (bpm): 92 Discharge Destination: Home Respiratory Rate (breaths/min): 16 Transportation: Private Auto Blood Pressure (mmHg): 149/75 Accompanied By: wife Schedule Follow-up Appointment: Yes Clinical Summary of Care: Patient Declined Electronic Signature(s) Signed: 02/02/2022 4:56:57 PM By: Michael [name] Entered By: Michael [name] on 02/02/2022 09:02:03 Michael [name] ([id number]) [id number] Page 2 of 9 -------------------------------------------------------------------------------- Lower Extremity Assessment Details Patient Name: Date of Service: Michael Michael [name], [name] 02/02/2022 8:45 A M Medical Record Number: [id number] Patient Account Number: [id number] Date of Birth/Sex: Treating RN: 02-09-1951 (71 y.o. Michael [name] Primary Care Michael [name]: Michael [name] Other Clinician: Referring Yair Dusza: Treating Michael [name]/Extender: Michael [name] in Treatment: 8 Edema Assessment Assessed: Shirlyn Goltz: No] [Right: No] Edema: [Left: Ye] [Right: s] Calf Left: Right: Point of Measurement: 35 cm From Medial Instep 37 cm Ankle Left: Right: Point of Measurement: 12 cm From Medial Instep 23 cm Vascular Assessment Pulses: Dorsalis Pedis Palpable: [Right:Yes] Electronic Signature(s) Signed: 02/03/2022 4:45:09 PM By: Michael Catholic RN Entered By: Michael [name] on 02/02/2022 08:45:38 -------------------------------------------------------------------------------- Multi Wound Chart Details Patient Name: Date of Service: Michael [name], [name] 02/02/2022 8:45 A M Medical Record Number: [id number] Patient Account Number: [id number] Date of Birth/Sex: Treating RN: [birth year] (71 y.o. M) Primary Care Michael [name]: Michael [name] Other Clinician: Referring  Michael [name]: Treating Michael [name]/Extender: Michael [name] in Treatment: 8 Vital Signs Height(in): 72 Pulse(bpm): 92 Weight(lbs): 208 Blood Pressure(mmHg): 149/75 Body Mass Index(BMI): 28.2 Temperature(F): 97.5 Respiratory Rate(breaths/min): 16 [3:Photos:] [[name]/A:[name]/A [id number] Page 3 of 9] Right, Anterior Lower Leg Right, Medial Upper Leg [name]/A Wound Location: Blister Surgical Injury [name]/A Wounding Event: Diabetic Wound/Ulcer of the Lower Diabetic Wound/Ulcer of the Lower [name]/A Primary Etiology: Extremity Extremity Chronic sinus problems/congestion, Chronic sinus problems/congestion, [name]/A Comorbid History: Hypertension, Peripheral Arterial Hypertension, Peripheral Arterial Disease, Peripheral Venous Disease, Disease, Peripheral Venous Disease, Type II Diabetes Type II  Diabetes 09/30/2021 11/18/2021 [name]/A Date Acquired: 8 2 [name]/A Weeks of Treatment: Open Healed - Epithelialized [name]/A Wound Status: No No [name]/A Wound Recurrence: 4.3x4x0.1 0x0x0 [name]/A Measurements L x W x D (cm) 13.509 0 [name]/A A (cm) : rea 1.351 0 [name]/A Volume (cm) : 23.10% 100.00% [name]/A % Reduction in A rea: 74.40% 100.00% [name]/A % Reduction in Volume: Grade 2 Grade 1 [name]/A Classification: Medium None Present [name]/A Exudate A mount: Serosanguineous [name]/A [name]/A Exudate Type: red, brown [name]/A [name]/A Exudate Color: Distinct, outline attached Flat and Intact [name]/A Wound Margin: Small (1-33%) None Present (0%) [name]/A Granulation A mount: Red, Pink [name]/A [name]/A Granulation Quality: Large (67-100%) None Present (0%) [name]/A Necrotic A mount: Fat Layer (Subcutaneous Tissue): Yes Fascia: No [name]/A Exposed Structures: Tendon: Yes Fat Layer (Subcutaneous Tissue): No Fascia: No Tendon: No Muscle: No Muscle: No Joint: No Joint: No Bone: No Bone: No Small (1-33%) Large (67-100%) [name]/A Epithelialization: Debridement - Selective/Open Wound [name]/A [name]/A Debridement: Pre-procedure Verification/Time Out 08:58 [name]/A  [name]/A Taken: Lidocaine 5% topical ointment [name]/A [name]/A Pain Control: Slough [name]/A [name]/A Tissue Debrided: Non-Viable Tissue [name]/A [name]/A Level: 17.2 [name]/A [name]/A Debridement A (sq cm): rea Curette [name]/A [name]/A Instrument: Minimum [name]/A [name]/A Bleeding: Pressure [name]/A [name]/A Hemostasis A chieved: Procedure was tolerated well [name]/A [name]/A Debridement Treatment Response: 4.3x4x0.1 [name]/A [name]/A Post Debridement Measurements L x W x D (cm) 1.351 [name]/A [name]/A Post Debridement Volume: (cm) No Abnormalities Noted [name]/A Periwound Skin Texture: Maceration: Yes [name]/A Periwound Skin Moisture: Atrophie Blanche: No [name]/A Periwound Skin Color: Cyanosis: No Erythema: No No Abnormality [name]/A [name]/A Temperature: Yes [name]/A [name]/A Tenderness on Palpation: Debridement [name]/A [name]/A Procedures Performed: Treatment Notes Wound #3 (Lower Leg) Wound Laterality: Right, Anterior Cleanser Soap and Water Discharge Instruction: May shower and wash wound with dial antibacterial soap and water prior to dressing change. Wound Cleanser Discharge Instruction: Cleanse the wound with wound cleanser prior to applying a clean dressing using gauze sponges, not tissue or cotton balls. Peri-Wound Care Sween Lotion (Moisturizing lotion) Discharge Instruction: Apply moisturizing lotion as directed Topical Primary Dressing Promogran Prisma Matrix, 4.34 (sq in) (silver collagen) Discharge Instruction: Moisten collagen with saline or hydrogel Secondary Dressing ABD Pad, 8x10 Discharge Instruction: Apply over primary dressing as directed. Woven Gauze Sponge, Non-Sterile 4x4 in Discharge Instruction: Apply over primary dressing as directed. NICOLAE, VASEK ([id number]) [id number] Page 4 of 9 Secured With [employer] Size 5, 10 (yds) Compression Wrap Unnaboot w/Calamine, 4x10 (in/yd) Discharge Instruction: Apply Unnaboot to top of dressing and around ankles to prevent dressing from sliding Kerlix Roll 4.5x3.1 (in/yd) Discharge Instruction: Apply  Kerlix and Coban compression as directed. Coban Self-Adherent Wrap 4x5 (in/yd) Discharge Instruction: Apply over Kerlix as directed. Compression Stockings Add-Ons Electronic Signature(s) Signed: 02/02/2022 9:03:19 AM By: [name] Previous Signature: 02/02/2022 8:50:00 AM Version By: [name] Entered By: Fredirick Maudlin on 02/02/2022 09:03:18 -------------------------------------------------------------------------------- Multi-Disciplinary Care Plan Details Patient Name: Date of Service: Michael Gillett, [place] [name]. 02/02/2022 8:45 A M Medical Record Number: [id number] Patient Account Number: [id number] Date of Birth/Sex: Treating RN: [birth year] (71 y.o. Michael [name] Primary Care Jovonna Nickell: Michael [name] Other Clinician: Referring Zameria Vogl: Treating Adalis Gatti/Extender: Michael [name] in Treatment: 8 Active Inactive Nutrition Nursing Diagnoses: Potential for alteratiion in Nutrition/Potential for imbalanced nutrition Goals: Patient/caregiver agrees to and verbalizes understanding of need to use nutritional supplements and/or vitamins as prescribed Date Initiated: 12/04/2021 Target Resolution Date: 02/27/2022 Goal Status: Active Patient/caregiver verbalizes understanding of need to maintain therapeutic glucose control  per primary care physician Date Initiated: 12/04/2021 Target Resolution Date: 02/27/2022 Goal Status: Active Interventions: Assess patient nutrition upon admission and as needed per policy Provide education on elevated blood sugars and impact on wound healing Provide education on nutrition Treatment Activities: Education provided on Nutrition : 01/26/2022 Notes: Wound/Skin Impairment Nursing Diagnoses: Impaired tissue integrity Knowledge deficit related to ulceration/compromised skin integrity Goals: Patient/caregiver will verbalize understanding of skin care regimen Date Initiated: 12/04/2021 Date  Inactivated: 02/02/2022 Target Resolution Date: 01/28/2022 Goal Status: Met Ulcer/skin breakdown will have a volume reduction of 30% by week 4 Slutsky, Michael [name] ([id number]) [id number] Page 5 of 9 Date Initiated: 12/04/2021 Date Inactivated: 01/07/2022 Target Resolution Date: 12/26/2021 Goal Status: Met Ulcer/skin breakdown will have a volume reduction of 50% by week 8 Date Initiated: 01/07/2022 Target Resolution Date: 02/27/2022 Goal Status: Active Interventions: Assess ulceration(s) every visit Provide education on ulcer and skin care Notes: Electronic Signature(s) Signed: 02/02/2022 4:56:57 PM By: Michael [name] Entered By: Michael [name] on 02/02/2022 09:00:52 -------------------------------------------------------------------------------- Pain Assessment Details Patient Name: Date of Service: Michael [name], [name] 02/02/2022 8:45 A M Medical Record Number: [id number] Patient Account Number: [id number] Date of Birth/Sex: Treating RN: [birth year] (72 y.o. Michael [name] Primary Care Lacrisha Bielicki: Michael [name] Other Clinician: Referring Tyann Niehaus: Treating Brianne Maina/Extender: Michael [name] in Treatment: 8 Active Problems Location of Pain Severity and Description of Pain Patient Has Paino Yes Site Locations Pain Location: Generalized Pain With Dressing Change: No Duration of the Pain. Constant / Intermittento Constant Rate the pain. Current Pain Level: 8 Worst Pain Level: 10 Least Pain Level: 3 Tolerable Pain Level: 3 Character of Pain Describe the Pain: Difficult to Pinpoint Pain Management and Medication Current Pain Management: Medication: No Cold Application: No Rest: No Massage: No Activity: No T.E.[name].S.: No Heat Application: No Leg drop or elevation: No Is the Current Pain Management Adequate: Adequate Electronic Signature(s) Signed: 02/03/2022 4:45:09 PM By: Michael Catholic RN Entered By: Michael [name] on 02/02/2022 08:45:30 Slivka, Michael [name] ([id number]) [id number] Page 6 of 9 -------------------------------------------------------------------------------- Patient/Caregiver Education Details Patient Name: Date of Service: Michael Michael [name], [name] 12/4/2023andnbsp8:45 A M Medical Record Number: [id number] Patient Account Number: [id number] Date of Birth/Gender: Treating RN: Jun 30, 1950 (71 y.o. Michael [name] Primary Care Physician: Michael [name] Other Clinician: Referring Physician: Treating Physician/Extender: Michael [name] in Treatment: 8 Education Assessment Education Provided To: Patient Education Topics Provided Wound/Skin Impairment: Methods: Explain/Verbal Responses: Reinforcements needed, State content correctly Electronic Signature(s) Signed: 02/02/2022 4:56:57 PM By: Michael [name] Entered By: Michael [name] on 02/02/2022 09:01:04 -------------------------------------------------------------------------------- Wound Assessment Details Patient Name: Date of Service: Michael [name], [name] 02/02/2022 8:45 A M Medical Record Number: [id number] Patient Account Number: [id number] Date of Birth/Sex: Treating RN: 1950-11-24 (71 y.o. Michael [name] Primary Care Ople Girgis: Michael [name] Other Clinician: Referring Purl Claytor: Treating Markeya Mincy/Extender: Michael [name] in Treatment: 8 Wound Status Wound Number: 3 Primary Diabetic Wound/Ulcer of the Lower Extremity Etiology: Wound Location: Right, Anterior Lower Leg Wound Open Wounding Event: Blister Status: Date Acquired: 09/30/2021 Comorbid Chronic sinus problems/congestion, Hypertension, Peripheral Weeks Of Treatment: 8 History: Arterial Disease, Peripheral Venous Disease, Type II Diabetes Clustered Wound: No Photos Wound Measurements Length: (cm) 4.3 Width: (cm) 4 Nawrot, Michael [name] ([id number]) Depth: (cm) 0.1 Area: (cm)  13.509 Volume: (cm) 1.351 % Reduction in Area: 23.1% % Reduction in Volume: 74.4% [id number] Page 7 of 9 Epithelialization: Small (1-33%) Wound Description Classification: Grade 2 Wound Margin: Distinct, outline attached Exudate  Amount: Medium Exudate Type: Serosanguineous Exudate Color: red, brown Foul Odor After Cleansing: No Slough/Fibrino Yes Wound Bed Granulation Amount: Small (1-33%) Exposed Structure Granulation Quality: Red, Pink Fascia Exposed: No Necrotic Amount: Large (67-100%) Fat Layer (Subcutaneous Tissue) Exposed: Yes Necrotic Quality: Adherent Slough Tendon Exposed: Yes Muscle Exposed: No Joint Exposed: No Bone Exposed: No Periwound Skin Texture Texture Color No Abnormalities Noted: Yes No Abnormalities Noted: Yes Moisture Temperature / Pain No Abnormalities Noted: No Temperature: No Abnormality Maceration: Yes Tenderness on Palpation: Yes Treatment Notes Wound #3 (Lower Leg) Wound Laterality: Right, Anterior Cleanser Soap and Water Discharge Instruction: May shower and wash wound with dial antibacterial soap and water prior to dressing change. Wound Cleanser Discharge Instruction: Cleanse the wound with wound cleanser prior to applying a clean dressing using gauze sponges, not tissue or cotton balls. Peri-Wound Care Sween Lotion (Moisturizing lotion) Discharge Instruction: Apply moisturizing lotion as directed Topical Primary Dressing Promogran Prisma Matrix, 4.34 (sq in) (silver collagen) Discharge Instruction: Moisten collagen with saline or hydrogel Secondary Dressing ABD Pad, 8x10 Discharge Instruction: Apply over primary dressing as directed. Woven Gauze Sponge, Non-Sterile 4x4 in Discharge Instruction: Apply over primary dressing as directed. Secured With [employer] Size 5, 10 (yds) Compression Wrap Unnaboot w/Calamine, 4x10 (in/yd) Discharge Instruction: Apply Unnaboot to top of dressing and around ankles to  prevent dressing from sliding Kerlix Roll 4.5x3.1 (in/yd) Discharge Instruction: Apply Kerlix and Coban compression as directed. Coban Self-Adherent Wrap 4x5 (in/yd) Discharge Instruction: Apply over Kerlix as directed. Compression Stockings Add-Ons Electronic Signature(s) Signed: 02/02/2022 11:02:50 AM By: Worthy Rancher Signed: 02/03/2022 4:45:09 PM By: Michael Catholic RN Entered By: Worthy Rancher on 02/02/2022 08:48:46 Michael [name] ([id number]) [id number] Page 8 of 9 -------------------------------------------------------------------------------- Wound Assessment Details Patient Name: Date of Service: Michael Michael [name], [name] 02/02/2022 8:45 A M Medical Record Number: [id number] Patient Account Number: [id number] Date of Birth/Sex: Treating RN: [birth year] (71 y.o. Michael [name] Primary Care Naome Brigandi: Michael [name] Other Clinician: Referring Blessing Ozga: Treating Debbrah Sampedro/Extender: Michael [name] in Treatment: 8 Wound Status Wound Number: 5 Primary Diabetic Wound/Ulcer of the Lower Extremity Etiology: Wound Location: Right, Medial Upper Leg Wound Healed - Epithelialized Wounding Event: Surgical Injury Status: Date Acquired: 11/18/2021 Comorbid Chronic sinus problems/congestion, Hypertension, Peripheral Weeks Of Treatment: 2 History: Arterial Disease, Peripheral Venous Disease, Type II Diabetes Clustered Wound: No Photos Wound Measurements Length: (cm) Width: (cm) Depth: (cm) Area: (cm) Volume: (cm) 0 % Reduction in Area: 100% 0 % Reduction in Volume: 100% 0 Epithelialization: Large (67-100%) 0 Tunneling: No 0 Undermining: No Wound Description Classification: Grade 1 Wound Margin: Flat and Intact Exudate Amount: None Present Foul Odor After Cleansing: No Slough/Fibrino No Wound Bed Granulation Amount: None Present (0%) Exposed Structure Necrotic Amount: None Present (0%) Fascia Exposed: No Fat Layer  (Subcutaneous Tissue) Exposed: No Tendon Exposed: No Muscle Exposed: No Joint Exposed: No Bone Exposed: No Periwound Skin Texture Texture Color No Abnormalities Noted: No No Abnormalities Noted: No Moisture No Abnormalities Noted: No Electronic Signature(s) Signed: 02/02/2022 4:56:57 PM By: Michael [name] Entered By: Michael [name] on 02/02/2022 08:57:22 Rohleder, Michael [name] ([id number]) [id number] Page 9 of 9 -------------------------------------------------------------------------------- Vitals Details Patient Name: Date of Service: Michael Michael [name], [name] 02/02/2022 8:45 A M Medical Record Number: [id number] Patient Account Number: [id number] Date of Birth/Sex: Treating RN: 1950/09/09 (71 y.o. Michael [name] Primary Care Manasa Spease: Michael [name] Other Clinician: Referring Shonnie Poudrier: Treating Hanan Mcwilliams/Extender: Michael [name] in Treatment: 8 Vital Signs Time Taken: 08:43 Temperature (  F): 97.5 Height (in): 72 Pulse (bpm): 92 Weight (lbs): 208 Respiratory Rate (breaths/min): 16 Body Mass Index (BMI): 28.2 Blood Pressure (mmHg): 149/75 Reference Range: 80 - 120 mg / dl Electronic Signature(s) Signed: 02/03/2022 4:45:09 PM By: Michael Catholic RN Entered By: Michael [name] on 02/02/2022 08:44:29

## 2022-02-09 ENCOUNTER — Encounter (HOSPITAL_BASED_OUTPATIENT_CLINIC_OR_DEPARTMENT_OTHER): Payer: Medicare Other | Admitting: Internal Medicine

## 2022-02-09 DIAGNOSIS — S71101A Unspecified open wound, right thigh, initial encounter: Secondary | ICD-10-CM | POA: Diagnosis not present

## 2022-02-09 DIAGNOSIS — E11622 Type 2 diabetes mellitus with other skin ulcer: Secondary | ICD-10-CM | POA: Diagnosis not present

## 2022-02-09 DIAGNOSIS — Z8546 Personal history of malignant neoplasm of prostate: Secondary | ICD-10-CM | POA: Diagnosis not present

## 2022-02-09 DIAGNOSIS — E1122 Type 2 diabetes mellitus with diabetic chronic kidney disease: Secondary | ICD-10-CM | POA: Diagnosis not present

## 2022-02-09 DIAGNOSIS — E785 Hyperlipidemia, unspecified: Secondary | ICD-10-CM | POA: Diagnosis not present

## 2022-02-09 DIAGNOSIS — L4050 Arthropathic psoriasis, unspecified: Secondary | ICD-10-CM | POA: Diagnosis not present

## 2022-02-09 DIAGNOSIS — E119 Type 2 diabetes mellitus without complications: Secondary | ICD-10-CM | POA: Diagnosis not present

## 2022-02-09 DIAGNOSIS — I129 Hypertensive chronic kidney disease with stage 1 through stage 4 chronic kidney disease, or unspecified chronic kidney disease: Secondary | ICD-10-CM | POA: Diagnosis not present

## 2022-02-09 DIAGNOSIS — L97122 Non-pressure chronic ulcer of left thigh with fat layer exposed: Secondary | ICD-10-CM | POA: Diagnosis not present

## 2022-02-09 DIAGNOSIS — I70239 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: Secondary | ICD-10-CM | POA: Diagnosis not present

## 2022-02-09 DIAGNOSIS — L97512 Non-pressure chronic ulcer of other part of right foot with fat layer exposed: Secondary | ICD-10-CM | POA: Diagnosis not present

## 2022-02-09 DIAGNOSIS — Z4801 Encounter for change or removal of surgical wound dressing: Secondary | ICD-10-CM | POA: Diagnosis not present

## 2022-02-09 DIAGNOSIS — L97815 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: Secondary | ICD-10-CM | POA: Diagnosis not present

## 2022-02-09 DIAGNOSIS — E1151 Type 2 diabetes mellitus with diabetic peripheral angiopathy without gangrene: Secondary | ICD-10-CM | POA: Diagnosis not present

## 2022-02-09 DIAGNOSIS — E1165 Type 2 diabetes mellitus with hyperglycemia: Secondary | ICD-10-CM | POA: Diagnosis not present

## 2022-02-10 NOTE — Progress Notes (Signed)
CURLEY, HOGEN ([id number]) [id number] Page 1 of 8 Visit Report for 02/09/2022 Cellular or Tissue Based Product Details Patient Name: Date of Service: HO FFMA [place], [name] 02/09/2022 8:45 A M Medical Record Number: [id number] Patient Account Number: [id number] Date of Birth/Sex: Treating RN: [birth year] (71 y.o. M) Primary Care Provider: [name] Other Clinician: Referring Provider: Treating Provider/Extender: Theda Belfast in Treatment: 9 Cellular or Tissue Based Product Type Wound #3 Right,Anterior Lower Leg Applied to: Performed By: Physician Ricard Dillon., MD Cellular or Tissue Based Product Type: Theraskin Level of Consciousness (Pre-procedure): Awake and Alert Pre-procedure Verification/Time Out Yes - 09:08 Taken: Location: trunk / arms / legs Wound Size (sq cm): 15.2 Product Size (sq cm): 21 Waste Size (sq cm): 0 Amount of Product Applied (sq cm): 21 Instrument Used: Forceps, Scissors Lot #: [id number] Expiration Date: 06/10/2026 Fenestrated: No Reconstituted: Yes Solution Type: normal saline Solution Amount: 10 Lot #: [id number] Solution Expiration Date: 07/30/2024 Secured: Yes Secured With: Steri-Strips Dressing Applied: Yes Primary Dressing: adaptic Procedural Pain: 0 Post Procedural Pain: 0 Response to Treatment: Procedure was tolerated well Level of Consciousness (Post- Awake and Alert procedure): Post Procedure Diagnosis Same as Pre-procedure Notes scribed for Dr. Dellia Nims by Adline Peals, RN Electronic Signature(s) Signed: 02/09/2022 5:01:06 PM By: Linton Ham MD Entered By: Linton Ham on 02/09/2022 09:15:42 -------------------------------------------------------------------------------- HPI Details Patient Name: Date of Service: [name], [place] C. 02/09/2022 8:45 A M Medical Record Number: [id number] Patient Account Number: [id number] Date of Birth/Sex: Treating  RN: Apr 30, 1950 (71 y.o. M) Primary Care Provider: [name] Other Clinician: Referring Provider: Treating Provider/Extender: Theda Belfast in Treatment: [address] [place] C ([id number]) [id number] Page 2 of 8 History of Present Illness Location: right lateral calf closer to the knee Quality: Patient reports experiencing a dull pain to affected area(s). Severity: Patient states wound(s) are getting worse. Duration: Patient has had the wound for > 7 months prior to seeking treatment at the wound center Timing: Pain in wound is constant (hurts all the time) Context: The wound would happen gradually Modifying Factors: Patient wound(s)/ulcer(s) are worsening due to : no resolution and a white material at the base of the wound ssociated Signs and Symptoms: Patient reports having:no discharge or purulent material A HPI Description: 71 year old gentleman who has been referred to was from his PCP for a chronic ulceration on his right lower extremity which she's had for several weeks. Past medical history significant for diabetes mellitus type 2, hypertension, hyperlipidemia, anxiety, obesity, peripheral vascular disease and chronic kidney disease. He has never been a smoker. Most recent lab work done at his PCPs office showed a glucose of 217 milligrams per deciliter which is consistent with hyperglycemia. In January 2017 recent hemoglobin A1c values noted to be 8.2%. In April 2017, he has been seen at the vascular office by Dr. Trula Slade and Dr. Kellie Simmering for right leg claudication. He had a right superficial femoral artery stent in September 2012. Recent noninvasive vascular imaging done on 06/24/2015 showed a right ABI of 1.07 with a triphasic waveform and a right TBI of 0.81. The left was noncompressible with a triphasic waveform and a TBI of 1.17. Right lower extremity arterial duplex was unable to identify stent exit but they were elevated  velocities at the stent and in the distal thigh with triphasic waveforms. Dr. Stephens Shire opinion was to return to the clinic in 6 months with ABI and right lower extremity arterial duplex studies to be done. He  has seen a dermatologist who had done a biopsy and said it was benign and he was given a steroid cream. 10/23/2015 -- Pathology report of the biopsy done in April 2017 shows ulcer with underlying angiodermatitis consistent with stasis dermatitis. 10/30/2015 -- he is going for shoulder surgery in about 10 days' time and was asking about the perioperative care. His blood sugars are running in the high 100s or low 200s. No hemoglobin A1c done recently. 11/20/2015 -- is back up to 2 weeks because of recent left shoulder surgery. 12/04/15; patient returns today with the wound for the most part looking healthy. No evidence of infection no debridement required. He is using Prisma for 4 weeks, without obvious improvement per her intake nurse. This started as several small open areas that were raised erythematous. He saw dermatology and at some point this was biopsied that just suggested stasis skin physiology. This certainly doesn't look like that 12/11/15; using Hydrafera Blue. Previous biopsy reviewed, no atypia PAS negative. He has a history of stents in the right leg however he does not appear to have primary arterial insufficiency ABI in this clinic was over 0.9. 12/25/2015 -- he has been approved for grafix and we will order for some to be applied next week 01/01/2016 -- he has had his first application of Grafix today 01/08/2016 -- he has had his second application of Grafix today READMISSION 12/04/2021 This is a now 71 year old man who was followed in our clinic about 5 years ago for an ulceration on his right lateral lower leg, just distal to the knee. He has since undergone a number of revascularization procedures that have been complicated by restenosis and wound infections. During the  course of this treatment, he developed a small ulcer on his right anterior tibial surface. Despite use of an Unna boot, the wound has continued to expand. He was referred to the wound care center by Dr. Trula Slade for further evaluation and management. On the right anterior tibial surface, there is an irregular wound with heavy slough and eschar accumulation. The periwound skin is intact but he does have 1-2+ pitting edema. There is no purulent drainage or malodor. 10/13; second visit for this man who has an ischemic wound in the setting of type 2 diabetes on the right anterior mid tibia area. He has been revascularized. Using Santyl gent 12/22/2021: The wound is about the same size this week. There is a lot of gray sloughy material on the surface, secondary to silver nitrate used to stop bleeding after what sounds like a fairly aggressive debridement last week. 12/30/2021: The wound is smaller this week and a bit cleaner. Edema control is excellent. There is still a bit of slough accumulation. 01/07/2022: The wound continues to contract and is much cleaner this week. Edema control is very good. He has small openings in his upper leg surgical scars, but he is going to be seeing Dr. Trula Slade next Monday and will have him take a look. He has been applying manuka honey to both of the sites. We have been using Santyl and gentamicin on his lower leg wound. 01/14/2022: He saw vascular surgery this week and was told that they were happy to have [place] manage the 2 wounds from his operation. Both of these have a light layer of slough on the surface. The more distal wound is a little bit dry. The anterior tibial wound continues to accumulate a fair amount of slough. Edema control is good. 01/19/2022: Both upper leg wounds are smaller.  The more distal is quite dry and cracked when I was examining it. The lower leg wound is more painful and the surrounding tissue is erythematous and indurated. 01/26/2022: The upper  leg wounds are healed. The skin at the distal leg wound is quite dry. The lower leg wound is cleaner but still fairly painful. His wrap slid again this week. 02/02/2022: We had good success keeping his wrap intact using a standard Unna boot. The lower leg wound continues to contract and is clean and less painful this week. 12/11; TheraSkin #1 Engineer, maintenance) Signed: 02/09/2022 5:01:06 PM By: Linton Ham MD Entered By: Linton Ham on 02/09/2022 09:12:56 [name] ([id number]) [id number] Page 3 of 8 -------------------------------------------------------------------------------- Physical Exam Details Patient Name: Date of Service: [name], [name] 02/09/2022 8:45 A M Medical Record Number: [id number] Patient Account Number: [id number] Date of Birth/Sex: Treating RN: 10-02-1950 (71 y.o. M) Primary Care Provider: [name] Other Clinician: Referring Provider: Treating Provider/Extender: Theda Belfast in Treatment: 9 Constitutional Sitting or standing Blood Pressure is within target range for patient.. Pulse regular and within target range for patient.Marland Kitchen Respirations regular, non-labored and within target range.. Temperature is normal and within the target range for the patient.Marland Kitchen Appears in no distress. Cardiovascular Patient has a palpable posterior tibial pulse but no dorsalis pedis pulse. Notes Wound exam; the wound continues to contract. In the middle of this he has a large island of exposed muscle with tendon inferiorly. No debridement was done on this. There is no evidence of surrounding infection Electronic Signature(s) Signed: 02/09/2022 5:01:06 PM By: Linton Ham MD Entered By: Linton Ham on 02/09/2022 09:13:53 -------------------------------------------------------------------------------- Physician Orders Details Patient Name: Date of Service: HO [place], [place] C. 02/09/2022 8:45 A  M Medical Record Number: [id number] Patient Account Number: [id number] Date of Birth/Sex: Treating RN: [birth year] (71 y.o. [name] Primary Care Provider: [name] Other Clinician: Referring Provider: Treating Provider/Extender: Theda Belfast in Treatment: 9 Verbal / Phone Orders: No Diagnosis Coding Follow-up Appointments ppointment in 1 week. - Dr. Celine Ahr - room 1 or 4 Return A Anesthetic Wound #3 Right,Anterior Lower Leg (In clinic) Topical Lidocaine 5% applied to wound bed Cellular or Tissue Based Products Cellular or Tissue Based Product Type: - Theraskin #1 Cellular or Tissue Based Product applied to wound bed, secured with steri-strips, cover with Adaptic or Mepitel. (DO NOT REMOVE). Bathing/ Shower/ Hygiene May shower with protection but do not get wound dressing(s) wet. - purchase cast protector from [facility], [facility] or [employer] Edema Control - Lymphedema / SCD / Other Right Lower Extremity Elevate legs to the level of the heart or above for 30 minutes daily and/or when sitting, a frequency of: Avoid standing for long periods of time. Wound Treatment Wound #3 - Lower Leg Wound Laterality: Right, Anterior Cleanser: Soap and Water 1 x Per Week/30 Days Discharge Instructions: May shower and wash wound with dial antibacterial soap and water prior to dressing change. Cleanser: Wound Cleanser 1 x Per Week/30 Days ESA, RADEN ([id number]) [id number] Page 4 of 8 Discharge Instructions: Cleanse the wound with wound cleanser prior to applying a clean dressing using gauze sponges, not tissue or cotton balls. Peri-Wound Care: Sween Lotion (Moisturizing lotion) 1 x Per Week/30 Days Discharge Instructions: Apply moisturizing lotion as directed Prim Dressing: ADAPTIC TOUCH 3x4.25 (in/in) 1 x Per Week/30 Days ary Discharge Instructions: Apply to wound bed as instructed Prim Dressing: Promogran Prisma Matrix, 4.34 (sq  in) (silver collagen)  1 x Per Week/30 Days ary Discharge Instructions: Moisten collagen with saline or hydrogel Secondary Dressing: ABD Pad, 8x10 1 x Per Week/30 Days Discharge Instructions: Apply over primary dressing as directed. Secondary Dressing: Woven Gauze Sponge, Non-Sterile 4x4 in 1 x Per Week/30 Days Discharge Instructions: Apply over primary dressing as directed. Secured With: [employer] Size 5, 10 (yds) 1 x Per Week/30 Days Compression Wrap: Unnaboot w/Calamine, 4x10 (in/yd) 1 x Per Week/30 Days Discharge Instructions: Apply Unnaboot to top of dressing and around ankles to prevent dressing from sliding Compression Wrap: Kerlix Roll 4.5x3.1 (in/yd) 1 x Per Week/30 Days Discharge Instructions: Apply Kerlix and Coban compression as directed. Compression Wrap: Coban Self-Adherent Wrap 4x5 (in/yd) 1 x Per Week/30 Days Discharge Instructions: Apply over Kerlix as directed. Patient Medications llergies: Actos, Demerol, glimepiride, nabumetone, oxycodone HCl, penicillin A Notifications Medication Indication Start End 02/09/2022 lidocaine DOSE topical 5 % ointment - ointment topical Electronic Signature(s) Signed: 02/09/2022 4:59:49 PM By: Adline Peals Signed: 02/09/2022 5:01:06 PM By: Linton Ham MD Entered By: Adline Peals on 02/09/2022 09:12:01 -------------------------------------------------------------------------------- Problem List Details Patient Name: Date of Service: [name], [place] C. 02/09/2022 8:45 A M Medical Record Number: [id number] Patient Account Number: [id number] Date of Birth/Sex: Treating RN: 1950-10-25 (71 y.o. M) Primary Care Provider: [name] Other Clinician: Referring Provider: Treating Provider/Extender: Theda Belfast in Treatment: 9 Active Problems ICD-10 Encounter Code Description Active Date MDM Diagnosis L97.815 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 12/04/2021  No Yes involvement without evidence of necrosis I70.239 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 12/04/2021 No Yes E11.622 Type 2 diabetes mellitus with other skin ulcer 12/04/2021 No Yes Guandique, Jorje C ([id number]) [id number] Page 5 of 8 I73.9 Peripheral vascular disease, unspecified 12/04/2021 No Yes L97.122 Non-pressure chronic ulcer of left thigh with fat layer exposed 01/14/2022 No Yes Inactive Problems Resolved Problems Electronic Signature(s) Signed: 02/09/2022 5:01:06 PM By: Linton Ham MD Entered By: Linton Ham on 02/09/2022 09:12:05 -------------------------------------------------------------------------------- Progress Note Details Patient Name: Date of Service: [name], [name] 02/09/2022 8:45 A M Medical Record Number: [id number] Patient Account Number: [id number] Date of Birth/Sex: Treating RN: 1950-10-20 (71 y.o. M) Primary Care Provider: [name] Other Clinician: Referring Provider: Treating Provider/Extender: Theda Belfast in Treatment: 9 Subjective History of Present Illness (HPI) The following HPI elements were documented for the patient's wound: Location: right lateral calf closer to the knee Quality: Patient reports experiencing a dull pain to affected area(s). Severity: Patient states wound(s) are getting worse. Duration: Patient has had the wound for > 7 months prior to seeking treatment at the wound center Timing: Pain in wound is constant (hurts all the time) Context: The wound would happen gradually Modifying Factors: Patient wound(s)/ulcer(s) are worsening due to : no resolution and a white material at the base of the wound Associated Signs and Symptoms: Patient reports having:no discharge or purulent material 71 year old gentleman who has been referred to was from his PCP for a chronic ulceration on his right lower extremity which she's had  for several weeks. Past medical history significant for diabetes mellitus type 2, hypertension, hyperlipidemia, anxiety, obesity, peripheral vascular disease and chronic kidney disease. He has never been a smoker. Most recent lab work done at his PCPs office showed a glucose of 217 milligrams per deciliter which is consistent with hyperglycemia. In January 2017 recent hemoglobin A1c values noted to be 8.2%. In April 2017,  he has been seen at the vascular office by Dr. Trula Slade and Dr. Kellie Simmering for right leg claudication. He had a right superficial femoral artery stent in September 2012. Recent noninvasive vascular imaging done on 06/24/2015 showed a right ABI of 1.07 with a triphasic waveform and a right TBI of 0.81. The left was noncompressible with a triphasic waveform and a TBI of 1.17. Right lower extremity arterial duplex was unable to identify stent exit but they were elevated velocities at the stent and in the distal thigh with triphasic waveforms. Dr. Stephens Shire opinion was to return to the clinic in 6 months with ABI and right lower extremity arterial duplex studies to be done. He has seen a dermatologist who had done a biopsy and said it was benign and he was given a steroid cream. 10/23/2015 -- Pathology report of the biopsy done in April 2017 shows ulcer with underlying angiodermatitis consistent with stasis dermatitis. 10/30/2015 -- he is going for shoulder surgery in about 10 days' time and was asking about the perioperative care. His blood sugars are running in the high 100s or low 200s. No hemoglobin A1c done recently. 11/20/2015 -- is back up to 2 weeks because of recent left shoulder surgery. 12/04/15; patient returns today with the wound for the most part looking healthy. No evidence of infection no debridement required. He is using Prisma for 4 weeks, without obvious improvement per her intake nurse. This started as several small open areas that were raised erythematous. He saw  dermatology and at some point this was biopsied that just suggested stasis skin physiology. This certainly doesn't look like that 12/11/15; using Hydrafera Blue. Previous biopsy reviewed, no atypia PAS negative. He has a history of stents in the right leg however he does not appear to have primary arterial insufficiency ABI in this clinic was over 0.9. 12/25/2015 -- he has been approved for grafix and we will order for some to be applied next week 01/01/2016 -- he has had his first application of Grafix today 01/08/2016 -- he has had his second application of Grafix today READMISSION 12/04/2021 This is a now 71 year old man who was followed in our clinic about 5 years ago for an ulceration on his right lateral lower leg, just distal to the knee. He has since undergone a number of revascularization procedures that have been complicated by restenosis and wound infections. During the course of this treatment, he developed a small ulcer on his right anterior tibial surface. Despite use of an Unna boot, the wound has continued to expand. He was referred to the wound care center by Dr. Trula Slade for further evaluation and management. IZAIH, KATAOKA ([id number]) [id number] Page 6 of 8 On the right anterior tibial surface, there is an irregular wound with heavy slough and eschar accumulation. The periwound skin is intact but he does have 1-2+ pitting edema. There is no purulent drainage or malodor. 10/13; second visit for this man who has an ischemic wound in the setting of type 2 diabetes on the right anterior mid tibia area. He has been revascularized. Using Santyl gent 12/22/2021: The wound is about the same size this week. There is a lot of gray sloughy material on the surface, secondary to silver nitrate used to stop bleeding after what sounds like a fairly aggressive debridement last week. 12/30/2021: The wound is smaller this week and a bit cleaner. Edema control is  excellent. There is still a bit of slough accumulation. 01/07/2022: The wound continues to contract and is much  cleaner this week. Edema control is very good. He has small openings in his upper leg surgical scars, but he is going to be seeing Dr. Trula Slade next Monday and will have him take a look. He has been applying manuka honey to both of the sites. We have been using Santyl and gentamicin on his lower leg wound. 01/14/2022: He saw vascular surgery this week and was told that they were happy to have [place] manage the 2 wounds from his operation. Both of these have a light layer of slough on the surface. The more distal wound is a little bit dry. The anterior tibial wound continues to accumulate a fair amount of slough. Edema control is good. 01/19/2022: Both upper leg wounds are smaller. The more distal is quite dry and cracked when I was examining it. The lower leg wound is more painful and the surrounding tissue is erythematous and indurated. 01/26/2022: The upper leg wounds are healed. The skin at the distal leg wound is quite dry. The lower leg wound is cleaner but still fairly painful. His wrap slid again this week. 02/02/2022: We had good success keeping his wrap intact using a standard Unna boot. The lower leg wound continues to contract and is clean and less painful this week. 12/11; TheraSkin #1 Objective Constitutional Sitting or standing Blood Pressure is within target range for patient.. Pulse regular and within target range for patient.Marland Kitchen Respirations regular, non-labored and within target range.. Temperature is normal and within the target range for the patient.Marland Kitchen Appears in no distress. Vitals Time Taken: 8:30 AM, Height: 72 in, Weight: 208 lbs, BMI: 28.2, Temperature: 97.5 F, Pulse: 107 bpm, Respiratory Rate: 20 breaths/min, Blood Pressure: 112/61 mmHg, Capillary Blood Glucose: 101 mg/dl. Cardiovascular Patient has a palpable posterior tibial pulse but no dorsalis pedis  pulse. General Notes: Wound exam; the wound continues to contract. In the middle of this he has a large island of exposed muscle with tendon inferiorly. No debridement was done on this. There is no evidence of surrounding infection Integumentary (Hair, Skin) Wound #3 status is Open. Original cause of wound was Blister. The date acquired was: 09/30/2021. The wound has been in treatment 9 weeks. The wound is located on the Right,Anterior Lower Leg. The wound measures 4cm length x 3.8cm width x 0.1cm depth; 11.938cm^2 area and 1.194cm^3 volume. There is Fat Layer (Subcutaneous Tissue) exposed. There is no tunneling or undermining noted. There is a medium amount of serosanguineous drainage noted. The wound margin is distinct with the outline attached to the wound base. There is small (1-33%) red, pink granulation within the wound bed. There is a large (67-100%) amount of necrotic tissue within the wound bed including Adherent Slough. The periwound skin appearance had no abnormalities noted for texture. The periwound skin appearance had no abnormalities noted for color. The periwound skin appearance exhibited: Maceration. Periwound temperature was noted as No Abnormality. The periwound has tenderness on palpation. Assessment Active Problems ICD-10 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 Type 2 diabetes mellitus with other skin ulcer Peripheral vascular disease, unspecified Non-pressure chronic ulcer of left thigh with fat layer exposed Procedures ROLF, FELLS ([id number]) [id number] Page 7 of 8 Wound #3 Pre-procedure diagnosis of Wound #3 is a Diabetic Wound/Ulcer of the Lower Extremity located on the Right,Anterior Lower Leg. A skin graft procedure using a bioengineered skin substitute/cellular or tissue based  product was performed by Ricard Dillon., MD with the following instrument(s): Forceps and Scissors. Theraskin was applied and secured with Steri-Strips. 21 sq cm of product was utilized and 0 sq cm was wasted. Post Application, adaptic was applied. A Time Out was conducted at 09:08, prior to the start of the procedure. The procedure was tolerated well with a pain level of 0 throughout and a pain level of 0 following the procedure. Post procedure Diagnosis Wound #3: Same as Pre-Procedure General Notes: scribed for Dr. Dellia Nims by Adline Peals, RN. Plan Follow-up Appointments: Return Appointment in 1 week. - Dr. Celine Ahr - room 1 or 4 Anesthetic: Wound #3 Right,Anterior Lower Leg: (In clinic) Topical Lidocaine 5% applied to wound bed Cellular or Tissue Based Products: Cellular or Tissue Based Product Type: - Theraskin #1 Cellular or Tissue Based Product applied to wound bed, secured with steri-strips, cover with Adaptic or Mepitel. (DO NOT REMOVE). Bathing/ Shower/ Hygiene: May shower with protection but do not get wound dressing(s) wet. - purchase cast protector from [facility], [facility] or [employer] Edema Control - Lymphedema / SCD / Other: Elevate legs to the level of the heart or above for 30 minutes daily and/or when sitting, a frequency of: Avoid standing for long periods of time. The following medication(s) was prescribed: lidocaine topical 5 % ointment ointment topical was prescribed at facility WOUND #3: - Lower Leg Wound Laterality: Right, Anterior Cleanser: Soap and Water 1 x Per Week/30 Days Discharge Instructions: May shower and wash wound with dial antibacterial soap and water prior to dressing change. Cleanser: Wound Cleanser 1 x Per Week/30 Days Discharge Instructions: Cleanse the wound with wound cleanser prior to applying a clean dressing using gauze sponges, not tissue or cotton balls. Peri-Wound Care: Sween Lotion (Moisturizing lotion) 1 x Per Week/30 Days Discharge  Instructions: Apply moisturizing lotion as directed Prim Dressing: ADAPTIC TOUCH 3x4.25 (in/in) 1 x Per Week/30 Days ary Discharge Instructions: Apply to wound bed as instructed Prim Dressing: Promogran Prisma Matrix, 4.34 (sq in) (silver collagen) 1 x Per Week/30 Days ary Discharge Instructions: Moisten collagen with saline or hydrogel Secondary Dressing: ABD Pad, 8x10 1 x Per Week/30 Days Discharge Instructions: Apply over primary dressing as directed. Secondary Dressing: Woven Gauze Sponge, Non-Sterile 4x4 in 1 x Per Week/30 Days Discharge Instructions: Apply over primary dressing as directed. Secured With: [employer] Size 5, 10 (yds) 1 x Per Week/30 Days Com pression Wrap: Unnaboot w/Calamine, 4x10 (in/yd) 1 x Per Week/30 Days Discharge Instructions: Apply Unnaboot to top of dressing and around ankles to prevent dressing from sliding Com pression Wrap: Kerlix Roll 4.5x3.1 (in/yd) 1 x Per Week/30 Days Discharge Instructions: Apply Kerlix and Coban compression as directed. Com pression Wrap: Coban Self-Adherent Wrap 4x5 (in/yd) 1 x Per Week/30 Days Discharge Instructions: Apply over Kerlix as directed. 1. TheraSkin #1. 2. The TheraSkin we received today was not large enough to cover the whole wound surface we will use collagen around the circumference of where we could not get coverage with the TheraSkin we had 3. He will need a larger piece of TheraSkin for next time, if that arrives next week we will change the dressing then otherwise 2 weeks for the TheraSkin. Electronic Signature(s) Signed: 02/09/2022 5:01:06 PM By: Linton Ham MD Entered By: Linton Ham on 02/09/2022 09:15:21 -------------------------------------------------------------------------------- SuperBill Details Patient Name: Date of Service: HO [place], [place] C. 02/09/2022 Medical Record Number: [id number] Patient Account Number: [id number] Date of Birth/Sex: Treating RN: 10-Feb-1951 (71 y.o. 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 Facility Procedures : CPT4 Code: [id number] Description: 62130 - SKIN SUB GRAFT TRNK/ARM/LEG ICD-10 Diagnosis Description L97.815 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 involveme E11.622 Type 2 diabetes mellitus with other skin ulcer I70.239 Atherosclerosis of  native arteries of right leg with ulceration of unspecified si Modifier: nt without evidence te Quantity: 1 of necrosis Physician Procedures : CPT4 Code Description Modifier [id number] - WC PHYS SKIN SUB GRAFT TRNK/ARM/LEG ICD-10 Diagnosis Description L97.815 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

## 2022-02-11 NOTE — Progress Notes (Signed)
Michael [name], Michael [name] ([id number]) [id number] Page 1 of 7 Visit Report for 02/09/2022 Arrival Information Details Patient Name: Date of Service: Michael [name], Michael C. 02/09/2022 8:45 A M Medical Record Number: [id number] Patient Account Number: [id number] Date of Birth/Sex: Treating RN: Aug 30, 1950 (71 y.o. M) Primary Care Sanai Frick: [name] Other Clinician: Referring Rushie Brazel: Treating Lea Baine/Extender: Theda Belfast in Treatment: 9 Visit Information History Since Last Visit All ordered tests and consults were completed: No Patient Arrived: Ambulatory Added or deleted any medications: No Arrival Time: 08:33 Any new allergies or adverse reactions: No Accompanied By: wife Had a fall or experienced change in No Transfer Assistance: None activities of daily living that may affect Patient Identification Verified: Yes risk of falls: Secondary Verification Process Completed: Yes Signs or symptoms of abuse/neglect since last visito No Patient Requires Transmission-Based Precautions: No Hospitalized since last visit: No Patient Has Alerts: Yes Implantable device outside of the clinic excluding No Patient Alerts: Patient on Blood Thinner cellular tissue based products placed in the center plavix since last visit: Pain Present Now: No Electronic Signature(s) Signed: 02/09/2022 8:59:46 AM By: Worthy Rancher Entered By: Worthy Rancher on 02/09/2022 08:34:20 -------------------------------------------------------------------------------- Encounter Discharge Information Details Patient Name: Date of Service: Michael FFMA N, Michael C. 02/09/2022 8:45 A M Medical Record Number: [id number] Patient Account Number: [id number] Date of Birth/Sex: Treating RN: [birth year] (71 y.o. Michael [name] Primary Care Bailley Guilford: [name] Other Clinician: Referring Gwenna Fuston: Treating Francella Barnett/Extender: Theda Belfast in Treatment:  9 Encounter Discharge Information Items Post Procedure Vitals Discharge Condition: Stable Temperature (F): 97.5 Ambulatory Status: Ambulatory Pulse (bpm): 107 Discharge Destination: Home Respiratory Rate (breaths/min): 20 Transportation: Private Auto Blood Pressure (mmHg): 112/61 Accompanied By: wife Schedule Follow-up Appointment: Yes Clinical Summary of Care: Patient Declined Electronic Signature(s) Signed: 02/09/2022 4:59:49 PM By: Adline Peals Entered By: Adline Peals on 02/09/2022 09:25:29 [name] ([id number]) [id number] Page 2 of 7 -------------------------------------------------------------------------------- Lower Extremity Assessment Details Patient Name: Date of Service: Michael [name], Michael C. 02/09/2022 8:45 A M Medical Record Number: [id number] Patient Account Number: [id number] Date of Birth/Sex: Treating RN: March 21, 1950 (71 y.o. [name] Primary Care Theoren Palka: [name] Other Clinician: Referring Jaclynne Baldo: Treating Maudine Kluesner/Extender: Theda Belfast in Treatment: 9 Edema Assessment Assessed: Michael [name]: No] Patrice Paradise: No] Edema: [Left: Ye] [Right: s] Calf Left: Right: Point of Measurement: 35 cm From Medial Instep 37 cm Ankle Left: Right: Point of Measurement: 12 cm From Medial Instep 23 cm Vascular Assessment Pulses: Dorsalis Pedis Palpable: [Right:Yes] Electronic Signature(s) Signed: 02/11/2022 2:56:09 PM By: Dellie Catholic RN Entered By: Dellie Catholic on 02/09/2022 08:52:35 -------------------------------------------------------------------------------- Multi Wound Chart Details Patient Name: Date of Service: Michael FFMA N, [name] 02/09/2022 8:45 A M Medical Record Number: [id number] Patient Account Number: [id number] Date of Birth/Sex: Treating RN: [birth year] (71 y.o. M) Primary Care Miryam Mcelhinney: [name] Other Clinician: Referring Izzabelle Bouley: Treating Jahon Bart/Extender:  Theda Belfast in Treatment: 9 Vital Signs Height(in): 72 Capillary Blood Glucose(mg/dl): 101 Weight(lbs): 208 Pulse(bpm): 107 Body Mass Index(BMI): 28.2 Blood Pressure(mmHg): 112/61 Temperature(F): 97.5 Respiratory Rate(breaths/min): 20 [3:Photos:] [N/A:N/A] Right, Anterior Lower Leg N/A N/A Wound Location: Blister N/A N/A Wounding Event: Diabetic Wound/Ulcer of the Lower N/A N/A Primary Etiology: Extremity Chronic sinus problems/congestion, N/A N/A Comorbid History: Hypertension, Peripheral Arterial Disease, Peripheral Venous Disease, Type II Diabetes 09/30/2021 N/A N/A Date Acquired: 9 N/A N/A Weeks of Treatment: Open N/A N/A Wound Status: No N/A N/A Wound Recurrence: 4x3.8x0.1 N/A N/A Measurements  L x W x D (cm) 11.938 N/A N/A A (cm) : rea 1.194 N/A N/A Volume (cm) : 32.00% N/A N/A % Reduction in A rea: 77.30% N/A N/A % Reduction in Volume: Grade 2 N/A N/A Classification: Medium N/A N/A Exudate A mount: Serosanguineous N/A N/A Exudate Type: red, brown N/A N/A Exudate Color: Distinct, outline attached N/A N/A Wound Margin: Small (1-33%) N/A N/A Granulation A mount: Red, Pink N/A N/A Granulation Quality: Large (67-100%) N/A N/A Necrotic A mount: Fat Layer (Subcutaneous Tissue): Yes N/A N/A Exposed Structures: Fascia: No Tendon: No Muscle: No Joint: No Bone: No Small (1-33%) N/A N/A Epithelialization: No Abnormalities Noted N/A N/A Periwound Skin Texture: Maceration: Yes N/A N/A Periwound Skin Moisture: Atrophie Blanche: No N/A N/A Periwound Skin Color: Cyanosis: No Erythema: No No Abnormality N/A N/A Temperature: Yes N/A N/A Tenderness on Palpation: Cellular or Tissue Based Product N/A N/A Procedures Performed: Treatment Notes Electronic Signature(s) Signed: 02/09/2022 5:01:06 PM By: Linton Ham MD Entered By: Linton Ham on 02/09/2022  09:12:13 -------------------------------------------------------------------------------- Multi-Disciplinary Care Plan Details Patient Name: Date of Service: Michael FFMA N, [place] C. 02/09/2022 8:45 A M Medical Record Number: [id number] Patient Account Number: [id number] Date of Birth/Sex: Treating RN: [birth year] (70 y.o. Michael [name] Primary Care Tobenna Needs: [name] Other Clinician: Referring Essa Wenk: Treating Areyanna Figeroa/Extender: Theda Belfast in Treatment: 9 Active Inactive Nutrition Nursing Diagnoses: Potential for alteratiion in Nutrition/Potential for imbalanced nutrition Goals: Patient/caregiver agrees to and verbalizes understanding of need to use nutritional supplements and/or vitamins as prescribed Date Initiated: 12/04/2021 Target Resolution Date: 02/27/2022 Goal Status: Active Patient/caregiver verbalizes understanding of need to maintain therapeutic glucose control per primary care physician Date Initiated: 12/04/2021 Target Resolution Date: 02/27/2022 Michael [name], Michael [name] ([id number]) [id number].pdf Page 4 of 7 Goal Status: Active Interventions: Assess patient nutrition upon admission and as needed per policy Provide education on elevated blood sugars and impact on wound healing Provide education on nutrition Treatment Activities: Education provided on Nutrition : 01/19/2022 Notes: Wound/Skin Impairment Nursing Diagnoses: Impaired tissue integrity Knowledge deficit related to ulceration/compromised skin integrity Goals: Patient/caregiver will verbalize understanding of skin care regimen Date Initiated: 12/04/2021 Date Inactivated: 02/02/2022 Target Resolution Date: 01/28/2022 Goal Status: Met Ulcer/skin breakdown will have a volume reduction of 30% by week 4 Date Initiated: 12/04/2021 Date Inactivated: 01/07/2022 Target Resolution Date: 12/26/2021 Goal Status: Met Ulcer/skin breakdown will have a volume  reduction of 50% by week 8 Date Initiated: 01/07/2022 Target Resolution Date: 02/27/2022 Goal Status: Active Interventions: Assess ulceration(s) every visit Provide education on ulcer and skin care Notes: Electronic Signature(s) Signed: 02/09/2022 4:59:49 PM By: Adline Peals Entered By: Adline Peals on 02/09/2022 09:12:07 -------------------------------------------------------------------------------- Pain Assessment Details Patient Name: Date of Service: Michael FFMA N, Michael C. 02/09/2022 8:45 A M Medical Record Number: [id number] Patient Account Number: [id number] Date of Birth/Sex: Treating RN: [birth year] (71 y.o. M) Primary Care Sergey Ishler: [name] Other Clinician: Referring Charlie Seda: Treating Makenli Derstine/Extender: Theda Belfast in Treatment: 9 Active Problems Location of Pain Severity and Description of Pain Patient Has Paino Yes Site Locations Rate the pain. Michael [name], Michael [name] ([id number]) [id number] Page 5 of 7 Rate the pain. Current Pain Level: 8 Worst Pain Level: 10 Least Pain Level: 0 Tolerable Pain Level: 3 Character of Pain Describe the Pain: Aching Pain Management and Medication Current Pain Management: Electronic Signature(s) Signed: 02/09/2022 8:59:46 AM By: Worthy Rancher Entered By: Worthy Rancher on 02/09/2022 08:35:31 -------------------------------------------------------------------------------- Patient/Caregiver Education Details Patient Name: Date of Service: Michael FFMA N, [place] C.  12/11/2023andnbsp8:45 A M Medical Record Number: [id number] Patient Account Number: [id number] Date of Birth/Gender: Treating RN: 01-Feb-1951 (71 y.o. Michael [name] Primary Care Physician: [name] Other Clinician: Referring Physician: Treating Physician/Extender: Theda Belfast in Treatment: 9 Education Assessment Education Provided To: Patient Education Topics  Provided Wound/Skin Impairment: Methods: Explain/Verbal Responses: Reinforcements needed, State content correctly Electronic Signature(s) Signed: 02/09/2022 4:59:49 PM By: Adline Peals Entered By: Adline Peals on 02/09/2022 09:12:18 -------------------------------------------------------------------------------- Wound Assessment Details Patient Name: Date of Service: Michael FFMA N, [place] C. 02/09/2022 8:45 A M Medical Record Number: [id number] Patient Account Number: [id number] Michael [name], Michael [name] ([id number]) [phone].pdf Page 6 of 7 Date of Birth/Sex: Treating RN: 12-01-1950 (71 y.o. M) Primary Care Qamar Aughenbaugh: Other Clinician: [name] Referring Ayaat Jansma: Treating Arel Tippen/Extender: Theda Belfast in Treatment: 9 Wound Status Wound Number: 3 Primary Diabetic Wound/Ulcer of the Lower Extremity Etiology: Wound Location: Right, Anterior Lower Leg Wound Open Wounding Event: Blister Status: Date Acquired: 09/30/2021 Comorbid Chronic sinus problems/congestion, Hypertension, Peripheral Weeks Of Treatment: 9 History: Arterial Disease, Peripheral Venous Disease, Type II Diabetes Clustered Wound: No Photos Wound Measurements Length: (cm) 4 Width: (cm) 3.8 Depth: (cm) 0.1 Area: (cm) 11.938 Volume: (cm) 1.194 % Reduction in Area: 32% % Reduction in Volume: 77.3% Epithelialization: Small (1-33%) Tunneling: No Undermining: No Wound Description Classification: Grade 2 Wound Margin: Distinct, outline attached Exudate Amount: Medium Exudate Type: Serosanguineous Exudate Color: red, brown Foul Odor After Cleansing: No Slough/Fibrino Yes Wound Bed Granulation Amount: Small (1-33%) Exposed Structure Granulation Quality: Red, Pink Fascia Exposed: No Necrotic Amount: Large (67-100%) Fat Layer (Subcutaneous Tissue) Exposed: Yes Necrotic Quality: Adherent Slough Tendon Exposed: No Muscle Exposed: No Joint Exposed:  No Bone Exposed: No Periwound Skin Texture Texture Color No Abnormalities Noted: Yes No Abnormalities Noted: Yes Moisture Temperature / Pain No Abnormalities Noted: No Temperature: No Abnormality Maceration: Yes Tenderness on Palpation: Yes Treatment Notes Wound #3 (Lower Leg) Wound Laterality: Right, Anterior Cleanser Soap and Water Discharge Instruction: May shower and wash wound with dial antibacterial soap and water prior to dressing change. Wound Cleanser Discharge Instruction: Cleanse the wound with wound cleanser prior to applying a clean dressing using gauze sponges, not tissue or cotton balls. Peri-Wound Care Sween Lotion (Moisturizing lotion) Discharge Instruction: Apply moisturizing lotion as directed Topical KIN, GALBRAITH ([id number]) [id number] Page 7 of 7 Primary Dressing ADAPTIC TOUCH 3x4.25 (in/in) Discharge Instruction: Apply to wound bed as instructed Promogran Prisma Matrix, 4.34 (sq in) (silver collagen) Discharge Instruction: Moisten collagen with saline or hydrogel Secondary Dressing ABD Pad, 8x10 Discharge Instruction: Apply over primary dressing as directed. Woven Gauze Sponge, Non-Sterile 4x4 in Discharge Instruction: Apply over primary dressing as directed. Secured With [employer] Size 5, 10 (yds) Compression Wrap Unnaboot w/Calamine, 4x10 (in/yd) Discharge Instruction: Apply Unnaboot to top of dressing and around ankles to prevent dressing from sliding Kerlix Roll 4.5x3.1 (in/yd) Discharge Instruction: Apply Kerlix and Coban compression as directed. Coban Self-Adherent Wrap 4x5 (in/yd) Discharge Instruction: Apply over Kerlix as directed. Compression Stockings Add-Ons Electronic Signature(s) Signed: 02/11/2022 2:56:09 PM By: Dellie Catholic RN Entered By: Dellie Catholic on 02/09/2022 08:53:14 -------------------------------------------------------------------------------- Vitals Details Patient Name: Date of  Service: Michael [place], [name] 02/09/2022 8:45 A M Medical Record Number: [id number] Patient Account Number: [id number] Date of Birth/Sex: Treating RN: 01-Mar-1951 (71 y.o. M) Primary Care Yobani Schertzer: [name] Other Clinician: Referring Aprille Sawhney: Treating Nefertari Rebman/Extender: Theda Belfast in Treatment: 9 Vital Signs Time Taken: 08:30 Temperature (F): 97.5 Height (in):  72 Pulse (bpm): 107 Weight (lbs): 208 Respiratory Rate (breaths/min): 20 Body Mass Index (BMI): 28.2 Blood Pressure (mmHg): 112/61 Capillary Blood Glucose (mg/dl): 101 Reference Range: 80 - 120 mg / dl Electronic Signature(s) Signed: 02/09/2022 8:59:46 AM By: Worthy Rancher Entered By: Worthy Rancher on 02/09/2022 08:34:57

## 2022-02-16 ENCOUNTER — Encounter (HOSPITAL_BASED_OUTPATIENT_CLINIC_OR_DEPARTMENT_OTHER): Payer: Medicare Other | Admitting: Internal Medicine

## 2022-02-16 DIAGNOSIS — E1122 Type 2 diabetes mellitus with diabetic chronic kidney disease: Secondary | ICD-10-CM | POA: Diagnosis not present

## 2022-02-16 DIAGNOSIS — L4050 Arthropathic psoriasis, unspecified: Secondary | ICD-10-CM | POA: Diagnosis not present

## 2022-02-16 DIAGNOSIS — E785 Hyperlipidemia, unspecified: Secondary | ICD-10-CM | POA: Diagnosis not present

## 2022-02-16 DIAGNOSIS — L97122 Non-pressure chronic ulcer of left thigh with fat layer exposed: Secondary | ICD-10-CM | POA: Diagnosis not present

## 2022-02-16 DIAGNOSIS — E1151 Type 2 diabetes mellitus with diabetic peripheral angiopathy without gangrene: Secondary | ICD-10-CM | POA: Diagnosis not present

## 2022-02-16 DIAGNOSIS — L97812 Non-pressure chronic ulcer of other part of right lower leg with fat layer exposed: Secondary | ICD-10-CM | POA: Diagnosis not present

## 2022-02-16 DIAGNOSIS — E11622 Type 2 diabetes mellitus with other skin ulcer: Secondary | ICD-10-CM | POA: Diagnosis not present

## 2022-02-16 DIAGNOSIS — L97815 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: Secondary | ICD-10-CM | POA: Diagnosis not present

## 2022-02-16 DIAGNOSIS — I129 Hypertensive chronic kidney disease with stage 1 through stage 4 chronic kidney disease, or unspecified chronic kidney disease: Secondary | ICD-10-CM | POA: Diagnosis not present

## 2022-02-16 DIAGNOSIS — Z8546 Personal history of malignant neoplasm of prostate: Secondary | ICD-10-CM | POA: Diagnosis not present

## 2022-02-16 DIAGNOSIS — I70239 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: Secondary | ICD-10-CM | POA: Diagnosis not present

## 2022-02-17 NOTE — Progress Notes (Signed)
Michael [name], Michael [name] ([id number]) [id number] Page 1 of 8 Visit Report for 02/16/2022 Arrival Information Details Patient Name: Date of Service: Michael FFMA [place], [name] 02/16/2022 8:45 A M Medical Record Number: [id number] Patient Account Number: [id number] Date of Birth/Sex: Treating RN: Mar 26, 1950 (71 y.o. M) Primary Care Rudolph Daoust: [name] Other Clinician: Referring Harla Mensch: Treating Djuana Littleton/Extender: Theda Belfast in Treatment: 10 Visit Information History Since Last Visit All ordered tests and consults were completed: No Patient Arrived: Ambulatory Added or deleted any medications: No Arrival Time: 08:49 Any Michael allergies or adverse reactions: No Accompanied By: wife Had a fall or experienced change in No Transfer Assistance: None activities of daily living that may affect Patient Identification Verified: Yes risk of falls: Secondary Verification Process Completed: Yes Signs or symptoms of abuse/neglect since last visito No Patient Requires Transmission-Based Precautions: No Hospitalized since last visit: No Patient Has Alerts: Yes Implantable device outside of the clinic excluding No Patient Alerts: Patient on Blood Thinner cellular tissue based products placed in the center plavix since last visit: Pain Present Now: No Electronic Signature(s) Signed: 02/16/2022 10:35:26 AM By: Worthy Rancher Entered By: Worthy Rancher on 02/16/2022 08:49:45 -------------------------------------------------------------------------------- Encounter Discharge Information Details Patient Name: Date of Service: Michael [name], [name] 02/16/2022 8:45 A M Medical Record Number: [id number] Patient Account Number: [id number] Date of Birth/Sex: Treating RN: 01/22/1951 (71 y.o. [name] Primary Care Waldon Sheerin: [name] Other Clinician: Referring Nicolet Griffy: Treating Reshard Guillet/Extender: Theda Belfast in Treatment:  10 Encounter Discharge Information Items Post Procedure Vitals Discharge Condition: Stable Temperature (F): 97.5 Ambulatory Status: Ambulatory Pulse (bpm): 90 Discharge Destination: Home Respiratory Rate (breaths/min): 20 Transportation: Private Auto Blood Pressure (mmHg): 116/65 Accompanied By: spouse Schedule Follow-up Appointment: Yes Clinical Summary of Care: Electronic Signature(s) Signed: 02/16/2022 5:09:12 PM By: [name] Entered By: Blanche East on 02/16/2022 09:18:08 [name] ([id number]) [id number] Page 2 of 8 -------------------------------------------------------------------------------- Lower Extremity Assessment Details Patient Name: Date of Service: Michael [name], [name] 02/16/2022 8:45 A M Medical Record Number: [id number] Patient Account Number: [id number] Date of Birth/Sex: Treating RN: Nov 30, 1950 (71 y.o. [name] Primary Care Rowan Pollman: [name] Other Clinician: Referring Gaines Cartmell: Treating Kirrah Mustin/Extender: Theda Belfast in Treatment: 10 Edema Assessment Assessed: Shirlyn Goltz: No] Patrice Paradise: No] Edema: [Left: Ye] [Right: s] Calf Left: Right: Point of Measurement: 35 cm From Medial Instep 36.5 cm Ankle Left: Right: Point of Measurement: 12 cm From Medial Instep 22.5 cm Vascular Assessment Pulses: Dorsalis Pedis Palpable: [Right:Yes] Electronic Signature(s) Signed: 02/16/2022 5:09:12 PM By: [name] Entered By: Blanche East on 02/16/2022 09:00:22 -------------------------------------------------------------------------------- Multi Wound Chart Details Patient Name: Date of Service: Michael [name], [name] 02/16/2022 8:45 A M Medical Record Number: [id number] Patient Account Number: [id number] Date of Birth/Sex: Treating RN: 05-27-1950 (71 y.o. M) Primary Care Dearia Wilmouth: [name] Other Clinician: Referring Catina Nuss: Treating Deyonte Cadden/Extender: Theda Belfast in Treatment: 10 Vital Signs Height(in): 72 Capillary Blood Glucose(mg/dl): 121 Weight(lbs): 208 Pulse(bpm): 63 Body Mass Index(BMI): 28.2 Blood Pressure(mmHg): 116/65 Temperature(F): 97.5 Respiratory Rate(breaths/min): 20 [3:Photos:] [[name]/A:[name]/A] Right, Anterior Lower Leg [name]/A [name]/A Wound Location: Blister [name]/A [name]/A Wounding Event: Diabetic Wound/Ulcer of the Lower [name]/A [name]/A Primary Etiology: Extremity Chronic sinus problems/congestion, [name]/A [name]/A Comorbid History: Hypertension, Peripheral Arterial Disease, Peripheral Venous Disease, Type II Diabetes 09/30/2021 [name]/A [name]/A Date Acquired: 10 [name]/A [name]/A Weeks of Treatment: Open [name]/A [name]/A Wound Status: No [name]/A [name]/A Wound Recurrence: 3.5x3.5x0.1 [name]/A [name]/A Measurements L  x W x D (cm) 9.621 [name]/A [name]/A A (cm) : rea 0.962 [name]/A [name]/A Volume (cm) : 45.20% [name]/A [name]/A % Reduction in A rea: 81.70% [name]/A [name]/A % Reduction in Volume: Grade 2 [name]/A [name]/A Classification: Medium [name]/A [name]/A Exudate A mount: Serosanguineous [name]/A [name]/A Exudate Type: red, brown [name]/A [name]/A Exudate Color: Distinct, outline attached [name]/A [name]/A Wound Margin: Small (1-33%) [name]/A [name]/A Granulation A mount: Red, Pink [name]/A [name]/A Granulation Quality: Large (67-100%) [name]/A [name]/A Necrotic A mount: Fat Layer (Subcutaneous Tissue): Yes [name]/A [name]/A Exposed Structures: Fascia: No Tendon: No Muscle: No Joint: No Bone: No Small (1-33%) [name]/A [name]/A Epithelialization: No Abnormalities Noted [name]/A [name]/A Periwound Skin Texture: Maceration: Yes [name]/A [name]/A Periwound Skin Moisture: Atrophie Blanche: No [name]/A [name]/A Periwound Skin Color: Cyanosis: No Erythema: No No Abnormality [name]/A [name]/A Temperature: Yes [name]/A [name]/A Tenderness on Palpation: Cellular or Tissue Based Product [name]/A [name]/A Procedures Performed: Treatment Notes Wound #3 (Lower Leg) Wound Laterality: Right, Anterior Cleanser Soap and Water Discharge Instruction: May shower and wash wound with dial antibacterial soap and water prior to dressing  change. Wound Cleanser Discharge Instruction: Cleanse the wound with wound cleanser prior to applying a clean dressing using gauze sponges, not tissue or cotton balls. Peri-Wound Care Sween Lotion (Moisturizing lotion) Discharge Instruction: Apply moisturizing lotion as directed Topical Primary Dressing ADAPTIC TOUCH 3x4.25 (in/in) Discharge Instruction: Apply to wound bed as instructed Promogran Prisma Matrix, 4.34 (sq in) (silver collagen) Discharge Instruction: Moisten collagen with saline or hydrogel Secondary Dressing ABD Pad, 8x10 Discharge Instruction: Apply over primary dressing as directed. Woven Gauze Sponge, Non-Sterile 4x4 in Discharge Instruction: Apply over primary dressing as directed. Secured With [employer] Size 5, 10 (yds) Compression Wrap Unnaboot w/Calamine, 4x10 (in/yd) Discharge Instruction: Apply Unnaboot to top of dressing and around ankles to prevent dressing from sliding Kerlix Roll 4.5x3.1 (in/yd) Discharge Instruction: Apply Kerlix and Coban compression as directed. Coban Self-Adherent Wrap 4x5 (in/yd) Michael [name], Michael [name] ([id number]) [id number] Page 4 of 8 Discharge Instruction: Apply over Kerlix as directed. Compression Stockings Add-Ons Electronic Signature(s) Signed: 02/16/2022 5:06:55 PM By: Linton Ham MD Entered By: Linton Ham on 02/16/2022 09:30:23 -------------------------------------------------------------------------------- Multi-Disciplinary Care Plan Details Patient Name: Date of Service: Michael [name], Michael [place] LTER [name]. 02/16/2022 8:45 A M Medical Record Number: [id number] Patient Account Number: [id number] Date of Birth/Sex: Treating RN: 27-Mar-1950 (71 y.o. [name] Primary Care Kengo Sturges: [name] Other Clinician: Referring Jaleeah Slight: Treating Zamiya Dillard/Extender: Theda Belfast in Treatment: 10 Active Inactive Nutrition Nursing Diagnoses: Potential for alteratiion in  Nutrition/Potential for imbalanced nutrition Goals: Patient/caregiver agrees to and verbalizes understanding of need to use nutritional supplements and/or vitamins as prescribed Date Initiated: 12/04/2021 Target Resolution Date: 02/27/2022 Goal Status: Active Patient/caregiver verbalizes understanding of need to maintain therapeutic glucose control per primary care physician Date Initiated: 12/04/2021 Target Resolution Date: 02/27/2022 Goal Status: Active Interventions: Assess patient nutrition upon admission and as needed per policy Provide education on elevated blood sugars and impact on wound healing Provide education on nutrition Treatment Activities: Education provided on Nutrition : 01/26/2022 Notes: Wound/Skin Impairment Nursing Diagnoses: Impaired tissue integrity Knowledge deficit related to ulceration/compromised skin integrity Goals: Patient/caregiver will verbalize understanding of skin care regimen Date Initiated: 12/04/2021 Date Inactivated: 02/02/2022 Target Resolution Date: 01/28/2022 Goal Status: Met Ulcer/skin breakdown will have a volume reduction of 30% by week 4 Date Initiated: 12/04/2021 Date Inactivated: 01/07/2022 Target Resolution Date: 12/26/2021 Goal Status: Met Ulcer/skin breakdown will have a volume reduction of 50% by week 8 Date Initiated: 01/07/2022 Target Resolution  Date: 02/27/2022 Goal Status: Active Interventions: Assess ulceration(s) every visit Provide education on ulcer and skin care TADARIUS, MALAND ([id number]) [phone].pdf Page 5 of 8 Notes: Electronic Signature(s) Signed: 02/16/2022 5:09:12 PM By: [name] Entered By: Blanche East on 02/16/2022 09:01:34 -------------------------------------------------------------------------------- Pain Assessment Details Patient Name: Date of Service: Michael [name], [name] 02/16/2022 8:45 A M Medical Record Number: [id number] Patient Account Number: [id number] Date  of Birth/Sex: Treating RN: 06-23-1950 (71 y.o. M) Primary Care Marianita Botkin: [name] Other Clinician: Referring Ajdin Macke: Treating Makena Murdock/Extender: Theda Belfast in Treatment: 10 Active Problems Location of Pain Severity and Description of Pain Patient Has Paino No Site Locations Rate the pain. Current Pain Level: 1 Worst Pain Level: 10 Least Pain Level: 0 Tolerable Pain Level: 5 Pain Management and Medication Current Pain Management: Electronic Signature(s) Signed: 02/16/2022 10:35:26 AM By: Worthy Rancher Entered By: Worthy Rancher on 02/16/2022 08:50:28 -------------------------------------------------------------------------------- Patient/Caregiver Education Details Patient Name: Date of Service: Michael [name], [place]. 12/18/2023andnbsp8:45 A M Medical Record Number: [id number] Patient Account Number: [id number] Date of Birth/Gender: Treating RN: 23-Oct-1950 (71 y.o. [name] Primary Care Physician: [name] Other Clinician: Referring Physician: Treating Physician/Extender: Theda Belfast in Treatment: [address] Michael [name], Michael [name] ([id number]) [id number] Page 6 of 8 Education Provided To: Patient Education Topics Provided Wound Debridement: Methods: Explain/Verbal Responses: Reinforcements needed, State content correctly Wound/Skin Impairment: Methods: Explain/Verbal Responses: Reinforcements needed, State content correctly Electronic Signature(s) Signed: 02/16/2022 5:09:12 PM By: [name] Entered By: Blanche East on 02/16/2022 09:01:51 -------------------------------------------------------------------------------- Wound Assessment Details Patient Name: Date of Service: Michael [name], [name] 02/16/2022 8:45 A M Medical Record Number: [id number] Patient Account Number: [id number] Date of Birth/Sex: Treating RN: 1951/01/26 (71 y.o. M) Primary Care Fahed Morten:  [name] Other Clinician: Referring Danyell Awbrey: Treating Benicio Manna/Extender: Theda Belfast in Treatment: 10 Wound Status Wound Number: 3 Primary Diabetic Wound/Ulcer of the Lower Extremity Etiology: Wound Location: Right, Anterior Lower Leg Wound Open Wounding Event: Blister Status: Date Acquired: 09/30/2021 Comorbid Chronic sinus problems/congestion, Hypertension, Peripheral Weeks Of Treatment: 10 History: Arterial Disease, Peripheral Venous Disease, Type II Diabetes Clustered Wound: No Photos Wound Measurements Length: (cm) 3.5 Width: (cm) 3.5 Depth: (cm) 0.1 Area: (cm) 9.621 Volume: (cm) 0.962 % Reduction in Area: 45.2% % Reduction in Volume: 81.7% Epithelialization: Small (1-33%) Tunneling: No Undermining: No Wound Description Classification: Grade 2 Wound Margin: Distinct, outline attached Exudate Amount: Medium Exudate Type: Serosanguineous Exudate Color: red, brown Foul Odor After Cleansing: No Slough/Fibrino Yes Wound Bed Granulation Amount: Small (1-33%) Exposed Structure Michael [name], Michael [name] ([id number]) [id number] Page 7 of 8 Granulation Quality: Red, Pink Fascia Exposed: No Necrotic Amount: Large (67-100%) Fat Layer (Subcutaneous Tissue) Exposed: Yes Necrotic Quality: Adherent Slough Tendon Exposed: No Muscle Exposed: No Joint Exposed: No Bone Exposed: No Periwound Skin Texture Texture Color No Abnormalities Noted: Yes No Abnormalities Noted: Yes Moisture Temperature / Pain No Abnormalities Noted: No Temperature: No Abnormality Maceration: Yes Tenderness on Palpation: Yes Treatment Notes Wound #3 (Lower Leg) Wound Laterality: Right, Anterior Cleanser Soap and Water Discharge Instruction: May shower and wash wound with dial antibacterial soap and water prior to dressing change. Wound Cleanser Discharge Instruction: Cleanse the wound with wound cleanser prior to applying a clean dressing using  gauze sponges, not tissue or cotton balls. Peri-Wound Care Sween Lotion (Moisturizing lotion) Discharge Instruction: Apply moisturizing lotion as directed Topical Primary Dressing ADAPTIC TOUCH 3x4.25 (in/in) Discharge Instruction: Apply to wound bed as  instructed Promogran Prisma Matrix, 4.34 (sq in) (silver collagen) Discharge Instruction: Moisten collagen with saline or hydrogel Secondary Dressing ABD Pad, 8x10 Discharge Instruction: Apply over primary dressing as directed. Woven Gauze Sponge, Non-Sterile 4x4 in Discharge Instruction: Apply over primary dressing as directed. Secured With [employer] Size 5, 10 (yds) Compression Wrap Unnaboot w/Calamine, 4x10 (in/yd) Discharge Instruction: Apply Unnaboot to top of dressing and around ankles to prevent dressing from sliding Kerlix Roll 4.5x3.1 (in/yd) Discharge Instruction: Apply Kerlix and Coban compression as directed. Coban Self-Adherent Wrap 4x5 (in/yd) Discharge Instruction: Apply over Kerlix as directed. Compression Stockings Add-Ons Electronic Signature(s) Signed: 02/16/2022 5:09:12 PM By: [name] Entered By: Blanche East on 02/16/2022 09:00:36 -------------------------------------------------------------------------------- Vitals Details Patient Name: Date of Service: Michael [place], [place] [name]. 02/16/2022 8:45 A M Medical Record Number: [id number] Patient Account Number: [id number] Michael [name], Michael [name] ([id number]) [id number] Page 8 of 8 Date of Birth/Sex: Treating RN: 03-08-1950 (71 y.o. M) Primary Care Azarian Starace: [name] Other Clinician: Referring Durelle Zepeda: Treating Sherree Shankman/Extender: Theda Belfast in Treatment: 10 Vital Signs Time Taken: 08:45 Temperature (F): 97.5 Height (in): 72 Pulse (bpm): 90 Weight (lbs): 208 Respiratory Rate (breaths/min): 20 Body Mass Index (BMI): 28.2 Blood Pressure (mmHg): 116/65 Capillary Blood Glucose (mg/dl):  121 Reference Range: 80 - 120 mg / dl Electronic Signature(s) Signed: 02/16/2022 10:35:26 AM By: Worthy Rancher Entered By: Worthy Rancher on 02/16/2022 08:50:12

## 2022-02-17 NOTE — Progress Notes (Signed)
BROC, CASPERS ([id number]) [id number] Page 1 of 8 Visit Report for 02/16/2022 Cellular or Tissue Based Product Details Patient Name: Date of Service: HO FFMA [place], [name] 02/16/2022 8:45 A M Medical Record Number: [id number] Patient Account Number: [id number] Date of Birth/Sex: Treating RN: March 27, 1950 (71 y.o. M) Primary Care Provider: [name] Other Clinician: Referring Provider: Treating Provider/Extender: Theda Belfast in Treatment: 10 Cellular or Tissue Based Product Type Wound #3 Right,Anterior Lower Leg Applied to: Performed By: Physician Ricard Dillon., MD Cellular or Tissue Based Product Type: Theraskin Level of Consciousness (Pre-procedure): Awake and Alert Pre-procedure Verification/Time Out Yes - 09:10 Taken: Location: trunk / arms / legs Wound Size (sq cm): 12.25 Product Size (sq cm): 39 Waste Size (sq cm): 0 Amount of Product Applied (sq cm): 39 Instrument Used: Forceps, Scissors Lot #: [id number] Order #: 2 Expiration Date: 12/09/2025 Fenestrated: No Reconstituted: Yes Solution Type: normal saline Solution Amount: 3 ml Lot #: [id number] Solution Expiration Date: 11/28/2026 Secured: Yes Secured With: Steri-Strips Dressing Applied: Yes Primary Dressing: Adaptic, Gauze Procedural Pain: 0 Post Procedural Pain: 0 Response to Treatment: Procedure was tolerated well Level of Consciousness (Post- Awake and Alert procedure): Post Procedure Diagnosis Same as Pre-procedure Notes Scribed for Dr. Dellia Nims by Blanche East, RN Electronic Signature(s) Signed: 02/16/2022 5:06:55 PM By: Linton Ham MD Entered By: Linton Ham on 02/16/2022 09:34:21 -------------------------------------------------------------------------------- HPI Details Patient Name: Date of Service: [name], [place] C. 02/16/2022 8:45 A M Medical Record Number: [id number] Patient Account Number: [id number] Date of Birth/Sex:  Treating RN: Oct 03, 1950 (71 y.o. M) Primary Care Provider: [name] Other Clinician: Referring Provider: Treating Provider/Extender: Mackie Pai, Marjory Lies ([id number]) [id number] Page 2 of 8 Weeks in Treatment: 10 History of Present Illness Location: right lateral calf closer to the knee Quality: Patient reports experiencing a dull pain to affected area(s). Severity: Patient states wound(s) are getting worse. Duration: Patient has had the wound for > 7 months prior to seeking treatment at the wound center Timing: Pain in wound is constant (hurts all the time) Context: The wound would happen gradually Modifying Factors: Patient wound(s)/ulcer(s) are worsening due to : no resolution and a white material at the base of the wound ssociated Signs and Symptoms: Patient reports having:no discharge or purulent material A HPI Description: 71 year old gentleman who has been referred to was from his PCP for a chronic ulceration on his right lower extremity which she's had for several weeks. Past medical history significant for diabetes mellitus type 2, hypertension, hyperlipidemia, anxiety, obesity, peripheral vascular disease and chronic kidney disease. He has never been a smoker. Most recent lab work done at his PCPs office showed a glucose of 217 milligrams per deciliter which is consistent with hyperglycemia. In January 2017 recent hemoglobin A1c values noted to be 8.2%. In April 2017, he has been seen at the vascular office by Dr. Trula Slade and Dr. Kellie Simmering for right leg claudication. He had a right superficial femoral artery stent in September 2012. Recent noninvasive vascular imaging done on 06/24/2015 showed a right ABI of 1.07 with a triphasic waveform and a right TBI of 0.81. The left was noncompressible with a triphasic waveform and a TBI of 1.17. Right lower extremity arterial duplex was unable to identify stent exit but they  were elevated velocities at the stent and in the distal thigh with triphasic waveforms. Dr. Stephens Shire opinion was to return to the clinic in 6 months with ABI and right lower extremity arterial duplex  studies to be done. He has seen a dermatologist who had done a biopsy and said it was benign and he was given a steroid cream. 10/23/2015 -- Pathology report of the biopsy done in April 2017 shows ulcer with underlying angiodermatitis consistent with stasis dermatitis. 10/30/2015 -- he is going for shoulder surgery in about 10 days' time and was asking about the perioperative care. His blood sugars are running in the high 100s or low 200s. No hemoglobin A1c done recently. 11/20/2015 -- is back up to 2 weeks because of recent left shoulder surgery. 12/04/15; patient returns today with the wound for the most part looking healthy. No evidence of infection no debridement required. He is using Prisma for 4 weeks, without obvious improvement per her intake nurse. This started as several small open areas that were raised erythematous. He saw dermatology and at some point this was biopsied that just suggested stasis skin physiology. This certainly doesn't look like that 12/11/15; using Hydrafera Blue. Previous biopsy reviewed, no atypia PAS negative. He has a history of stents in the right leg however he does not appear to have primary arterial insufficiency ABI in this clinic was over 0.9. 12/25/2015 -- he has been approved for grafix and we will order for some to be applied next week 01/01/2016 -- he has had his first application of Grafix today 01/08/2016 -- he has had his second application of Grafix today READMISSION 12/04/2021 This is a now 71 year old man who was followed in our clinic about 5 years ago for an ulceration on his right lateral lower leg, just distal to the knee. He has since undergone a number of revascularization procedures that have been complicated by restenosis and wound infections.  During the course of this treatment, he developed a small ulcer on his right anterior tibial surface. Despite use of an Unna boot, the wound has continued to expand. He was referred to the wound care center by Dr. Trula Slade for further evaluation and management. On the right anterior tibial surface, there is an irregular wound with heavy slough and eschar accumulation. The periwound skin is intact but he does have 1-2+ pitting edema. There is no purulent drainage or malodor. 10/13; second visit for this man who has an ischemic wound in the setting of type 2 diabetes on the right anterior mid tibia area. He has been revascularized. Using Santyl gent 12/22/2021: The wound is about the same size this week. There is a lot of gray sloughy material on the surface, secondary to silver nitrate used to stop bleeding after what sounds like a fairly aggressive debridement last week. 12/30/2021: The wound is smaller this week and a bit cleaner. Edema control is excellent. There is still a bit of slough accumulation. 01/07/2022: The wound continues to contract and is much cleaner this week. Edema control is very good. He has small openings in his upper leg surgical scars, but he is going to be seeing Dr. Trula Slade next Monday and will have him take a look. He has been applying manuka honey to both of the sites. We have been using Santyl and gentamicin on his lower leg wound. 01/14/2022: He saw vascular surgery this week and was told that they were happy to have [place] manage the 2 wounds from his operation. Both of these have a light layer of slough on the surface. The more distal wound is a little bit dry. The anterior tibial wound continues to accumulate a fair amount of slough. Edema control is good. 01/19/2022: Both  upper leg wounds are smaller. The more distal is quite dry and cracked when I was examining it. The lower leg wound is more painful and the surrounding tissue is erythematous and indurated. 01/26/2022:  The upper leg wounds are healed. The skin at the distal leg wound is quite dry. The lower leg wound is cleaner but still fairly painful. His wrap slid again this week. 02/02/2022: We had good success keeping his wrap intact using a standard Unna boot. The lower leg wound continues to contract and is clean and less painful this week. 12/11; TheraSkin #1 12/18; TheraSkin #2. Wound looks as though it is contracted. Patient states that his pain was better in the leg. Secondary dressing and kerlix Coban Electronic Signature(s) Signed: 02/16/2022 5:06:55 PM By: Linton Ham MD Entered By: Linton Ham on 02/16/2022 09:31:13 Shibley, Marjory Lies ([id number]) [id number] Page 3 of 8 -------------------------------------------------------------------------------- Physical Exam Details Patient Name: Date of Service: [name], [name] 02/16/2022 8:45 A M Medical Record Number: [id number] Patient Account Number: [id number] Date of Birth/Sex: Treating RN: 05/28/1950 (71 y.o. M) Primary Care Provider: [name] Other Clinician: Referring Provider: Treating Provider/Extender: Theda Belfast in Treatment: 10 Constitutional Sitting or standing Blood Pressure is within target range for patient.. Pulse regular and within target range for patient.Marland Kitchen Respirations regular, non-labored and within target range.. Temperature is normal and within the target range for the patient.Marland Kitchen Appears in no distress. Notes Wound exam; the wound looks as though it is contracting.. In the middle of this an area of exposed muscle. Also a slitlike opening with some depth in the middle. I used some of the TheraSkin to pack the slitlike opening and layered the rest of this over the top. In general the wound looks quite healthy. Electronic Signature(s) Signed: 02/16/2022 5:06:55 PM By: Linton Ham MD Entered By: Linton Ham on 02/16/2022  09:32:44 -------------------------------------------------------------------------------- Physician Orders Details Patient Name: Date of Service: HO [place], [place] C. 02/16/2022 8:45 A M Medical Record Number: [id number] Patient Account Number: [id number] Date of Birth/Sex: Treating RN: 08-21-1950 (71 y.o. [name] Primary Care Provider: [name] Other Clinician: Referring Provider: Treating Provider/Extender: Theda Belfast in Treatment: 10 Verbal / Phone Orders: No Diagnosis Coding Follow-up Appointments ppointment in 1 week. - Dr. Celine Ahr - room 1 or 4 Return A Anesthetic Wound #3 Right,Anterior Lower Leg (In clinic) Topical Lidocaine 5% applied to wound bed Cellular or Tissue Based Products Cellular or Tissue Based Product Type: - Theraskin #2 Cellular or Tissue Based Product applied to wound bed, secured with steri-strips, cover with Adaptic or Mepitel. (DO NOT REMOVE). Bathing/ Shower/ Hygiene May shower with protection but do not get wound dressing(s) wet. - purchase cast protector from [facility], [facility] or [employer] Edema Control - Lymphedema / SCD / Other Right Lower Extremity Elevate legs to the level of the heart or above for 30 minutes daily and/or when sitting, a frequency of: Avoid standing for long periods of time. Wound Treatment Wound #3 - Lower Leg Wound Laterality: Right, Anterior Cleanser: Soap and Water 1 x Per Week/30 Days Discharge Instructions: May shower and wash wound with dial antibacterial soap and water prior to dressing change. Cleanser: Wound Cleanser 1 x Per Week/30 Days Discharge Instructions: Cleanse the wound with wound cleanser prior to applying a clean dressing using gauze sponges, not tissue or cotton balls. Peri-Wound Care: Sween Lotion (Moisturizing lotion) 1 x Per Week/30 Days MATTHEWJAMES, PETRASEK ([id number]) [id number] Page 4 of 8  Discharge Instructions: Apply moisturizing lotion as  directed Prim Dressing: ADAPTIC TOUCH 3x4.25 (in/in) 1 x Per Week/30 Days ary Discharge Instructions: Apply to wound bed as instructed Prim Dressing: Promogran Prisma Matrix, 4.34 (sq in) (silver collagen) 1 x Per Week/30 Days ary Discharge Instructions: Moisten collagen with saline or hydrogel Secondary Dressing: ABD Pad, 8x10 1 x Per Week/30 Days Discharge Instructions: Apply over primary dressing as directed. Secondary Dressing: Woven Gauze Sponge, Non-Sterile 4x4 in 1 x Per Week/30 Days Discharge Instructions: Apply over primary dressing as directed. Secured With: [employer] Size 5, 10 (yds) 1 x Per Week/30 Days Compression Wrap: Unnaboot w/Calamine, 4x10 (in/yd) 1 x Per Week/30 Days Discharge Instructions: Apply Unnaboot to top of dressing and around ankles to prevent dressing from sliding Compression Wrap: Kerlix Roll 4.5x3.1 (in/yd) 1 x Per Week/30 Days Discharge Instructions: Apply Kerlix and Coban compression as directed. Compression Wrap: Coban Self-Adherent Wrap 4x5 (in/yd) 1 x Per Week/30 Days Discharge Instructions: Apply over Kerlix as directed. Electronic Signature(s) Signed: 02/16/2022 5:06:55 PM By: Linton Ham MD Signed: 02/16/2022 5:09:12 PM By: [name] Entered By: Blanche East on 02/16/2022 09:01:02 -------------------------------------------------------------------------------- Problem List Details Patient Name: Date of Service: [name], [name] 02/16/2022 8:45 A M Medical Record Number: [id number] Patient Account Number: [id number] Date of Birth/Sex: Treating RN: [birth year] (71 y.o. M) Primary Care Provider: [name] Other Clinician: Referring Provider: Treating Provider/Extender: Theda Belfast in Treatment: 10 Active Problems ICD-10 Encounter Code Description Active Date MDM Diagnosis L97.815 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 12/04/2021 No Yes involvement without evidence  of necrosis I70.239 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 12/04/2021 No Yes E11.622 Type 2 diabetes mellitus with other skin ulcer 12/04/2021 No Yes I73.9 Peripheral vascular disease, unspecified 12/04/2021 No Yes L97.122 Non-pressure chronic ulcer of left thigh with fat layer exposed 01/14/2022 No Yes Collantes, Shivaan C ([id number]) [id number] Page 5 of 8 Inactive Problems Resolved Problems Electronic Signature(s) Signed: 02/16/2022 5:06:55 PM By: Linton Ham MD Entered By: Linton Ham on 02/16/2022 09:30:15 -------------------------------------------------------------------------------- Progress Note Details Patient Name: Date of Service: [name], [name] 02/16/2022 8:45 A M Medical Record Number: [id number] Patient Account Number: [id number] Date of Birth/Sex: Treating RN: 1950-04-02 (71 y.o. M) Primary Care Provider: [name] Other Clinician: Referring Provider: Treating Provider/Extender: Theda Belfast in Treatment: 10 Subjective History of Present Illness (HPI) The following HPI elements were documented for the patient's wound: Location: right lateral calf closer to the knee Quality: Patient reports experiencing a dull pain to affected area(s). Severity: Patient states wound(s) are getting worse. Duration: Patient has had the wound for > 7 months prior to seeking treatment at the wound center Timing: Pain in wound is constant (hurts all the time) Context: The wound would happen gradually Modifying Factors: Patient wound(s)/ulcer(s) are worsening due to : no resolution and a white material at the base of the wound Associated Signs and Symptoms: Patient reports having:no discharge or purulent material 71 year old gentleman who has been referred to was from his PCP for a chronic ulceration on his right lower extremity which she's had for several weeks. Past medical  history significant for diabetes mellitus type 2, hypertension, hyperlipidemia, anxiety, obesity, peripheral vascular disease and chronic kidney disease. He has never been a smoker. Most recent lab work done at his PCPs office showed a glucose of 217 milligrams per deciliter which is consistent with hyperglycemia. In  January 2017 recent hemoglobin A1c values noted to be 8.2%. In April 2017, he has been seen at the vascular office by Dr. Trula Slade and Dr. Kellie Simmering for right leg claudication. He had a right superficial femoral artery stent in September 2012. Recent noninvasive vascular imaging done on 06/24/2015 showed a right ABI of 1.07 with a triphasic waveform and a right TBI of 0.81. The left was noncompressible with a triphasic waveform and a TBI of 1.17. Right lower extremity arterial duplex was unable to identify stent exit but they were elevated velocities at the stent and in the distal thigh with triphasic waveforms. Dr. Stephens Shire opinion was to return to the clinic in 6 months with ABI and right lower extremity arterial duplex studies to be done. He has seen a dermatologist who had done a biopsy and said it was benign and he was given a steroid cream. 10/23/2015 -- Pathology report of the biopsy done in April 2017 shows ulcer with underlying angiodermatitis consistent with stasis dermatitis. 10/30/2015 -- he is going for shoulder surgery in about 10 days' time and was asking about the perioperative care. His blood sugars are running in the high 100s or low 200s. No hemoglobin A1c done recently. 11/20/2015 -- is back up to 2 weeks because of recent left shoulder surgery. 12/04/15; patient returns today with the wound for the most part looking healthy. No evidence of infection no debridement required. He is using Prisma for 4 weeks, without obvious improvement per her intake nurse. This started as several small open areas that were raised erythematous. He saw dermatology and at some point this was  biopsied that just suggested stasis skin physiology. This certainly doesn't look like that 12/11/15; using Hydrafera Blue. Previous biopsy reviewed, no atypia PAS negative. He has a history of stents in the right leg however he does not appear to have primary arterial insufficiency ABI in this clinic was over 0.9. 12/25/2015 -- he has been approved for grafix and we will order for some to be applied next week 01/01/2016 -- he has had his first application of Grafix today 01/08/2016 -- he has had his second application of Grafix today READMISSION 12/04/2021 This is a now 71 year old man who was followed in our clinic about 5 years ago for an ulceration on his right lateral lower leg, just distal to the knee. He has since undergone a number of revascularization procedures that have been complicated by restenosis and wound infections. During the course of this treatment, he developed a small ulcer on his right anterior tibial surface. Despite use of an Unna boot, the wound has continued to expand. He was referred to the wound care center by Dr. Trula Slade for further evaluation and management. On the right anterior tibial surface, there is an irregular wound with heavy slough and eschar accumulation. The periwound skin is intact but he does have 1-2+ pitting edema. There is no purulent drainage or malodor. 10/13; second visit for this man who has an ischemic wound in the setting of type 2 diabetes on the right anterior mid tibia area. He has been revascularized. Using Santyl gent 12/22/2021: The wound is about the same size this week. There is a lot of gray sloughy material on the surface, secondary to silver nitrate used to stop bleeding after what sounds like a fairly aggressive debridement last week. 12/30/2021: The wound is smaller this week and a bit cleaner. Edema control is excellent. There is still a bit of slough accumulation. 01/07/2022: The wound continues to contract  and is much cleaner this  week. Edema control is very good. He has small openings in his upper leg surgical scars, but he is going to be seeing Dr. Trula Slade next Monday and will have him take a look. He has been applying manuka honey to both of the sites. We have been JARRET, TORRE ([id number]) [id number] Page 6 of 8 using Santyl and gentamicin on his lower leg wound. 01/14/2022: He saw vascular surgery this week and was told that they were happy to have [place] manage the 2 wounds from his operation. Both of these have a light layer of slough on the surface. The more distal wound is a little bit dry. The anterior tibial wound continues to accumulate a fair amount of slough. Edema control is good. 01/19/2022: Both upper leg wounds are smaller. The more distal is quite dry and cracked when I was examining it. The lower leg wound is more painful and the surrounding tissue is erythematous and indurated. 01/26/2022: The upper leg wounds are healed. The skin at the distal leg wound is quite dry. The lower leg wound is cleaner but still fairly painful. His wrap slid again this week. 02/02/2022: We had good success keeping his wrap intact using a standard Unna boot. The lower leg wound continues to contract and is clean and less painful this week. 12/11; TheraSkin #1 12/18; TheraSkin #2. Wound looks as though it is contracted. Patient states that his pain was better in the leg. Secondary dressing and kerlix Coban Objective Constitutional Sitting or standing Blood Pressure is within target range for patient.. Pulse regular and within target range for patient.Marland Kitchen Respirations regular, non-labored and within target range.. Temperature is normal and within the target range for the patient.Marland Kitchen Appears in no distress. Vitals Time Taken: 8:45 AM, Height: 72 in, Weight: 208 lbs, BMI: 28.2, Temperature: 97.5 F, Pulse: 90 bpm, Respiratory Rate: 20 breaths/min, Blood Pressure: 116/65 mmHg, Capillary Blood Glucose:  121 mg/dl. General Notes: Wound exam; the wound looks as though it is contracting.. In the middle of this an area of exposed muscle. Also a slitlike opening with some depth in the middle. I used some of the TheraSkin to pack the slitlike opening and layered the rest of this over the top. In general the wound looks quite healthy. Integumentary (Hair, Skin) Wound #3 status is Open. Original cause of wound was Blister. The date acquired was: 09/30/2021. The wound has been in treatment 10 weeks. The wound is located on the Right,Anterior Lower Leg. The wound measures 3.5cm length x 3.5cm width x 0.1cm depth; 9.621cm^2 area and 0.962cm^3 volume. There is Fat Layer (Subcutaneous Tissue) exposed. There is no tunneling or undermining noted. There is a medium amount of serosanguineous drainage noted. The wound margin is distinct with the outline attached to the wound base. There is small (1-33%) red, pink granulation within the wound bed. There is a large (67-100%) amount of necrotic tissue within the wound bed including Adherent Slough. The periwound skin appearance had no abnormalities noted for texture. The periwound skin appearance had no abnormalities noted for color. The periwound skin appearance exhibited: Maceration. Periwound temperature was noted as No Abnormality. The periwound has tenderness on palpation. Assessment Active Problems ICD-10 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 Type 2 diabetes mellitus with other skin ulcer Peripheral vascular disease, unspecified Non-pressure chronic ulcer of left thigh with fat  layer exposed Procedures Wound #3 Pre-procedure diagnosis of Wound #3 is a Diabetic Wound/Ulcer of the Lower Extremity located on the Right,Anterior Lower Leg. A skin graft procedure using a bioengineered skin substitute/cellular  or tissue based product was performed by Ricard Dillon., MD with the following instrument(s): Forceps and Scissors. Theraskin was applied and secured with Steri-Strips. 39 sq cm of product was utilized and 0 sq cm was wasted. Post Application, Adaptic, Gauze was applied. A Time Out was conducted at 09:10, prior to the start of the procedure. The procedure was tolerated well with a pain level of 0 throughout and a pain level of 0 following the procedure. Post procedure Diagnosis Wound #3: Same as Pre-Procedure General Notes: Scribed for Dr. Dellia Nims by Blanche East, RN. Plan ALHASSAN, [place]ER[place]AM ([id number]) [id number] Page 7 of 8 Follow-up Appointments: Return Appointment in 1 week. - Dr. Celine Ahr - room 1 or 4 Anesthetic: Wound #3 Right,Anterior Lower Leg: (In clinic) Topical Lidocaine 5% applied to wound bed Cellular or Tissue Based Products: Cellular or Tissue Based Product Type: - Theraskin #2 Cellular or Tissue Based Product applied to wound bed, secured with steri-strips, cover with Adaptic or Mepitel. (DO NOT REMOVE). Bathing/ Shower/ Hygiene: May shower with protection but do not get wound dressing(s) wet. - purchase cast protector from [facility], [facility] or [employer] Edema Control - Lymphedema / SCD / Other: Elevate legs to the level of the heart or above for 30 minutes daily and/or when sitting, a frequency of: Avoid standing for long periods of time. WOUND #3: - Lower Leg Wound Laterality: Right, Anterior Cleanser: Soap and Water 1 x Per Week/30 Days Discharge Instructions: May shower and wash wound with dial antibacterial soap and water prior to dressing change. Cleanser: Wound Cleanser 1 x Per Week/30 Days Discharge Instructions: Cleanse the wound with wound cleanser prior to applying a clean dressing using gauze sponges, not tissue or cotton balls. Peri-Wound Care: Sween Lotion (Moisturizing lotion) 1 x Per Week/30 Days Discharge Instructions: Apply  moisturizing lotion as directed Prim Dressing: ADAPTIC TOUCH 3x4.25 (in/in) 1 x Per Week/30 Days ary Discharge Instructions: Apply to wound bed as instructed Prim Dressing: Promogran Prisma Matrix, 4.34 (sq in) (silver collagen) 1 x Per Week/30 Days ary Discharge Instructions: Moisten collagen with saline or hydrogel Secondary Dressing: ABD Pad, 8x10 1 x Per Week/30 Days Discharge Instructions: Apply over primary dressing as directed. Secondary Dressing: Woven Gauze Sponge, Non-Sterile 4x4 in 1 x Per Week/30 Days Discharge Instructions: Apply over primary dressing as directed. Secured With: [employer] Size 5, 10 (yds) 1 x Per Week/30 Days Com pression Wrap: Unnaboot w/Calamine, 4x10 (in/yd) 1 x Per Week/30 Days Discharge Instructions: Apply Unnaboot to top of dressing and around ankles to prevent dressing from sliding Com pression Wrap: Kerlix Roll 4.5x3.1 (in/yd) 1 x Per Week/30 Days Discharge Instructions: Apply Kerlix and Coban compression as directed. Com pression Wrap: Coban Self-Adherent Wrap 4x5 (in/yd) 1 x Per Week/30 Days Discharge Instructions: Apply over Kerlix as directed. 1. TheraSkin applied as previously stated 2. Wound is contracted 3. He has exposed muscle and a slitlike opening in the center of this. Hopefully we can get coverage of both of these areas Electronic Signature(s) Signed: 02/16/2022 5:06:55 PM By: Linton Ham MD Entered By: Linton Ham on 02/16/2022 09:33:38 -------------------------------------------------------------------------------- SuperBill Details Patient Name: Date of Service: HO Franklin, Osceola C. 02/16/2022 Medical Record Number: [id number] Patient Account Number: [id number] Date of Birth/Sex: Treating RN: 05-23-1950 (71 y.o. 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

## 2022-02-24 ENCOUNTER — Encounter (HOSPITAL_BASED_OUTPATIENT_CLINIC_OR_DEPARTMENT_OTHER): Payer: Medicare Other | Admitting: General Surgery

## 2022-02-24 DIAGNOSIS — L4050 Arthropathic psoriasis, unspecified: Secondary | ICD-10-CM | POA: Diagnosis not present

## 2022-02-24 DIAGNOSIS — E1151 Type 2 diabetes mellitus with diabetic peripheral angiopathy without gangrene: Secondary | ICD-10-CM | POA: Diagnosis not present

## 2022-02-24 DIAGNOSIS — E785 Hyperlipidemia, unspecified: Secondary | ICD-10-CM | POA: Diagnosis not present

## 2022-02-24 DIAGNOSIS — I70239 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: Secondary | ICD-10-CM | POA: Diagnosis not present

## 2022-02-24 DIAGNOSIS — E11622 Type 2 diabetes mellitus with other skin ulcer: Secondary | ICD-10-CM | POA: Diagnosis not present

## 2022-02-24 DIAGNOSIS — Z8546 Personal history of malignant neoplasm of prostate: Secondary | ICD-10-CM | POA: Diagnosis not present

## 2022-02-24 DIAGNOSIS — E1122 Type 2 diabetes mellitus with diabetic chronic kidney disease: Secondary | ICD-10-CM | POA: Diagnosis not present

## 2022-02-24 DIAGNOSIS — I129 Hypertensive chronic kidney disease with stage 1 through stage 4 chronic kidney disease, or unspecified chronic kidney disease: Secondary | ICD-10-CM | POA: Diagnosis not present

## 2022-02-24 DIAGNOSIS — L97122 Non-pressure chronic ulcer of left thigh with fat layer exposed: Secondary | ICD-10-CM | POA: Diagnosis not present

## 2022-02-24 DIAGNOSIS — L97815 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: Secondary | ICD-10-CM | POA: Diagnosis not present

## 2022-02-24 DIAGNOSIS — L97812 Non-pressure chronic ulcer of other part of right lower leg with fat layer exposed: Secondary | ICD-10-CM | POA: Diagnosis not present

## 2022-02-27 NOTE — Progress Notes (Signed)
Michael [name], Michael [name] ([id number]) [phone].pdf Page 1 of 11 Visit Report for 02/24/2022 Chief Complaint Document Details Patient Name: Date of Service: [name], [name] 02/24/2022 2:00 PM Medical Record Number: [id number] Patient Account Number: [id number] Date of Birth/Sex: Treating RN: 1950/10/16 (71 y.o. M) Primary Care Provider: [name] Other Clinician: Referring Provider: Treating Provider/Extender: [name] in Treatment: 11 Information Obtained from: Patient Chief Complaint Patient presents to the wound care center today with an open arterial ulcer to the right lower extremity in the setting of diabetes mellitus. he has had this problem for 7 months 12/04/2021: ulcer to right lower anterior tibial surface Electronic Signature(s) Signed: 02/24/2022 2:58:13 PM By: [name] Entered By: Fredirick Maudlin on 02/24/2022 14:58:13 -------------------------------------------------------------------------------- Cellular or Tissue Based Product Details Patient Name: Date of Service: [name], [name] 02/24/2022 2:00 PM Medical Record Number: [id number] Patient Account Number: [id number] Date of Birth/Sex: Treating RN: 1950-04-18 (71 y.o. Michael [name] Primary Care Provider: [name] Other Clinician: Referring Provider: Treating Provider/Extender: [name] in Treatment: 11 Cellular or Tissue Based Product Type Wound #3 Right,Anterior Lower Leg Applied to: Performed By: Physician Fredirick Maudlin, MD Cellular or Tissue Based Product Type: Theraskin Level of Consciousness (Pre-procedure): Awake and Alert Pre-procedure Verification/Time Out Yes - 14:41 Taken: Location: trunk / arms / legs Wound Size (sq cm): 11.9 Product Size (sq cm): 39 Waste Size (sq cm): 18 Waste Reason: size of wound Amount of Product Applied (sq cm): 21 Instrument Used: Curette, Forceps,  Scissors Lot #: [id number] Order #: 3 Expiration Date: 06/09/2026 Fenestrated: No Reconstituted: Yes Solution Type: normal saline Solution Amount: 50 Lot #: [id number] Solution Expiration Date: 12/04/2022 Secured: Yes Secured With: Jodell Cipro Dressing Applied: Yes Primary Dressing: adaptetc Michael [name] ([id number]) [id number] Page 2 of 11 Procedural Pain: 0 Post Procedural Pain: 0 Response to Treatment: Procedure was tolerated well Level of Consciousness (Post- Awake and Alert procedure): Post Procedure Diagnosis Same as Pre-procedure Notes Scribed for Dr. Celine Ahr by Blanche East, RN Electronic Signature(s) Signed: 02/24/2022 4:00:46 PM By: [name] Signed: 02/25/2022 8:36:05 AM By: [name] Previous Signature: 02/24/2022 3:03:06 PM Version By: [name] Entered By: Blanche East on 02/24/2022 16:00:46 -------------------------------------------------------------------------------- Debridement Details Patient Name: Date of Service: [name], [name]. 02/24/2022 2:00 PM Medical Record Number: [id number] Patient Account Number: [id number] Date of Birth/Sex: Treating RN: [birth year] (72 y.o. Michael [name] Primary Care Provider: [name] Other Clinician: Referring Provider: Treating Provider/Extender: [name] in Treatment: 11 Debridement Performed for Assessment: Wound #3 Right,Anterior Lower Leg Performed By: Physician Fredirick Maudlin, MD Debridement Type: Debridement Severity of Tissue Pre Debridement: Fat layer exposed Level of Consciousness (Pre-procedure): Awake and Alert Pre-procedure Verification/Time Out Yes - 14:39 Taken: Start Time: 14:40 Pain Control: Lidocaine 5% topical ointment T Area Debrided (L x W): otal 3.5 (cm) x 3.4 (cm) = 11.9 (cm) Tissue and other material debrided: Non-Viable, Slough, Slough Level: Non-Viable Tissue Debridement  Description: Selective/Open Wound Instrument: Curette Bleeding: Minimum Hemostasis Achieved: Pressure Response to Treatment: Procedure was tolerated well Level of Consciousness (Post- Awake and Alert procedure): Post Debridement Measurements of Total Wound Length: (cm) 3.5 Width: (cm) 3.4 Depth: (cm) 0.2 Volume: (cm) 1.869 Character of Wound/Ulcer Post Debridement: Requires Further Debridement Severity of Tissue Post Debridement: Fat layer exposed Post Procedure Diagnosis Same as Pre-procedure Notes Scribed for Dr. Celine Ahr by Blanche East, RN Electronic Signature(s) Signed: 02/24/2022  3:03:06 PM By: [name] Signed: 02/27/2022 12:15:24 PM By: [name] Entered By: Blanche East on 02/24/2022 14:41:14 Michael [name] ([id number]) [id number] Page 3 of 11 -------------------------------------------------------------------------------- HPI Details Patient Name: Date of Service: [name], [name] 02/24/2022 2:00 PM Medical Record Number: [id number] Patient Account Number: [id number] Date of Birth/Sex: Treating RN: 1951/02/28 (71 y.o. M) Primary Care Provider: [name] Other Clinician: Referring Provider: Treating Provider/Extender: [name] in Treatment: 11 History of Present Illness Location: right lateral calf closer to the knee Quality: Patient reports experiencing a dull pain to affected area(s). Severity: Patient states wound(s) are getting worse. Duration: Patient has had the wound for > 7 months prior to seeking treatment at the wound center Timing: Pain in wound is constant (hurts all the time) Context: The wound would happen gradually Modifying Factors: Patient wound(s)/ulcer(s) are worsening due to : no resolution and a white material at the base of the wound ssociated Signs and Symptoms: Patient reports having:no discharge or purulent material A HPI Description: 71 year old  gentleman who has been referred to was from his PCP for a chronic ulceration on his right lower extremity which she's had for several weeks. Past medical history significant for diabetes mellitus type 2, hypertension, hyperlipidemia, anxiety, obesity, peripheral vascular disease and chronic kidney disease. He has never been a smoker. Most recent lab work done at his PCPs office showed a glucose of 217 milligrams per deciliter which is consistent with hyperglycemia. In January 2017 recent hemoglobin A1c values noted to be 8.2%. In April 2017, he has been seen at the vascular office by Dr. Trula Slade and Dr. Kellie Simmering for right leg claudication. He had a right superficial femoral artery stent in September 2012. Recent noninvasive vascular imaging done on 06/24/2015 showed a right ABI of 1.07 with a triphasic waveform and a right TBI of 0.81. The left was noncompressible with a triphasic waveform and a TBI of 1.17. Right lower extremity arterial duplex was unable to identify stent exit but they were elevated velocities at the stent and in the distal thigh with triphasic waveforms. Dr. Stephens Shire opinion was to return to the clinic in 6 months with ABI and right lower extremity arterial duplex studies to be done. He has seen a dermatologist who had done a biopsy and said it was benign and he was given a steroid cream. 10/23/2015 -- Pathology report of the biopsy done in April 2017 shows ulcer with underlying angiodermatitis consistent with stasis dermatitis. 10/30/2015 -- he is going for shoulder surgery in about 10 days' time and was asking about the perioperative care. His blood sugars are running in the high 100s or low 200s. No hemoglobin A1c done recently. 11/20/2015 -- is back up to 2 weeks because of recent left shoulder surgery. 12/04/15; patient returns today with the wound for the most part looking healthy. No evidence of infection no debridement required. He is using Prisma for 4 weeks, without obvious  improvement per her intake nurse. This started as several small open areas that were raised erythematous. He saw dermatology and at some point this was biopsied that just suggested stasis skin physiology. This certainly doesn't look like that 12/11/15; using Hydrafera Blue. Previous biopsy reviewed, no atypia PAS negative. He has a history of stents in the right leg however he does not appear to have primary arterial insufficiency ABI in this clinic was over 0.9. 12/25/2015 -- he has been approved for grafix and we will order for some  to be applied next week 01/01/2016 -- he has had his first application of Grafix today 01/08/2016 -- he has had his second application of Grafix today READMISSION 12/04/2021 This is a now 71 year old man who was followed in our clinic about 5 years ago for an ulceration on his right lateral lower leg, just distal to the knee. He has since undergone a number of revascularization procedures that have been complicated by restenosis and wound infections. During the course of this treatment, he developed a small ulcer on his right anterior tibial surface. Despite use of an Unna boot, the wound has continued to expand. He was referred to the wound care center by Dr. Trula Slade for further evaluation and management. On the right anterior tibial surface, there is an irregular wound with heavy slough and eschar accumulation. The periwound skin is intact but he does have 1-2+ pitting edema. There is no purulent drainage or malodor. 10/13; second visit for this man who has an ischemic wound in the setting of type 2 diabetes on the right anterior mid tibia area. He has been revascularized. Using Santyl gent 12/22/2021: The wound is about the same size this week. There is a lot of gray sloughy material on the surface, secondary to silver nitrate used to stop bleeding after what sounds like a fairly aggressive debridement last week. 12/30/2021: The wound is smaller this week and a bit  cleaner. Edema control is excellent. There is still a bit of slough accumulation. 01/07/2022: The wound continues to contract and is much cleaner this week. Edema control is very good. He has small openings in his upper leg surgical scars, but he is going to be seeing Dr. Trula Slade next Monday and will have him take a look. He has been applying manuka honey to both of the sites. We have been using Santyl and gentamicin on his lower leg wound. 01/14/2022: He saw vascular surgery this week and was told that they were happy to have [place] manage the 2 wounds from his operation. Both of these have a light layer of slough on the surface. The more distal wound is a little bit dry. The anterior tibial wound continues to accumulate a fair amount of slough. Edema control is good. 01/19/2022: Both upper leg wounds are smaller. The more distal is quite dry and cracked when I was examining it. The lower leg wound is more painful and the surrounding tissue is erythematous and indurated. 01/26/2022: The upper leg wounds are healed. The skin at the distal leg wound is quite dry. The lower leg wound is cleaner but still fairly painful. His wrap slid again this week. 02/02/2022: We had good success keeping his wrap intact using a standard Unna boot. The lower leg wound continues to contract and is clean and less painful this week. Michael [name], Michael [name] ([id number]) [phone].pdf Page 4 of 11 12/11; TheraSkin #1 12/18; TheraSkin #2. Wound looks as though it is contracted. Patient states that his pain was better in the leg. Secondary dressing and kerlix Coban 02/24/2022: The wound is smaller since I saw it last. It is less painful and there is good granulation tissue, including over the exposed tendon. Electronic Signature(s) Signed: 02/24/2022 2:59:06 PM By: [name] Entered By: Fredirick Maudlin on 02/24/2022  14:59:06 -------------------------------------------------------------------------------- Physical Exam Details Patient Name: Date of Service: [name], [name] 02/24/2022 2:00 PM Medical Record Number: [id number] Patient Account Number: [id number] Date of Birth/Sex: Treating RN: 24-Jun-1950 (71 y.o. M) Primary Care Provider: [name]  Other Clinician: Referring Provider: Treating Provider/Extender: [name] in Treatment: 11 Constitutional Slightly hypertensive. . . . No acute distress. Respiratory Normal work of breathing on room air. Notes 02/24/2022: The wound is smaller since I saw it last. It is less painful and there is good granulation tissue, including over the exposed tendon. Electronic Signature(s) Signed: 02/24/2022 3:00:51 PM By: [name] Entered By: Fredirick Maudlin on 02/24/2022 15:00:51 -------------------------------------------------------------------------------- Physician Orders Details Patient Name: Date of Service: HO [place], [place] [name]. 02/24/2022 2:00 PM Medical Record Number: [id number] Patient Account Number: [id number] Date of Birth/Sex: Treating RN: [birth year] (71 y.o. Michael [name] Primary Care Provider: [name] Other Clinician: Referring Provider: Treating Provider/Extender: [name] in Treatment: 11 Verbal / Phone Orders: No Diagnosis Coding ICD-10 Coding Code Description [phone] 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 I70.239 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 E11.622 Type 2 diabetes mellitus with other skin ulcer I73.9 Peripheral vascular disease, unspecified L97.122 Non-pressure chronic ulcer of left thigh with fat layer exposed Follow-up Appointments ppointment in 1 week. - Dr. Celine Ahr - room 1 or 4 Return A Anesthetic [name], BLAYLOCK ([id number]) [id number] Page 5 of 11 Wound #3 Right,Anterior Lower Leg (In clinic) Topical Lidocaine 5% applied to wound bed Cellular or Tissue Based Products Cellular or Tissue Based Product Type: - Theraskin #2 Cellular or Tissue Based Product applied to wound bed, secured with steri-strips, cover with Adaptic or Mepitel. (DO NOT REMOVE). Edema Control - Lymphedema / SCD / Other Right Lower Extremity Avoid standing for long periods of time. Wound Treatment Wound #3 - Lower Leg Wound Laterality: Right, Anterior Cleanser: Soap and Water 1 x Per Week/30 Days Discharge Instructions: May shower and wash wound with dial antibacterial soap and water prior to dressing change. Cleanser: Wound Cleanser 1 x Per Week/30 Days Discharge Instructions: Cleanse the wound with wound cleanser prior to applying a clean dressing using gauze sponges, not tissue or cotton balls. Peri-Wound Care: Sween Lotion (Moisturizing lotion) 1 x Per Week/30 Days Discharge Instructions: Apply moisturizing lotion as directed Prim Dressing: ADAPTIC TOUCH 3x4.25 (in/in) 1 x Per Week/30 Days ary Discharge Instructions: Apply to wound bed as instructed Prim Dressing: Promogran Prisma Matrix, 4.34 (sq in) (silver collagen) 1 x Per Week/30 Days ary Discharge Instructions: Moisten collagen with saline or hydrogel Secondary Dressing: ABD Pad, 8x10 1 x Per Week/30 Days Discharge Instructions: Apply over primary dressing as directed. Secondary Dressing: Woven Gauze Sponge, Non-Sterile 4x4 in 1 x Per Week/30 Days Discharge Instructions: Apply over primary dressing as directed. Secured With: [employer] Size 5, 10 (yds) 1 x Per Week/30 Days Compression Wrap: Unnaboot w/Calamine, 4x10 (in/yd) 1 x Per Week/30 Days Discharge Instructions: Apply Unnaboot to top of dressing and around ankles to prevent dressing from sliding Compression Wrap: Kerlix Roll 4.5x3.1 (in/yd) 1 x Per Week/30 Days Discharge  Instructions: Apply Kerlix and Coban compression as directed. Compression Wrap: Coban Self-Adherent Wrap 4x5 (in/yd) 1 x Per Week/30 Days Discharge Instructions: Apply over Kerlix as directed. Electronic Signature(s) Signed: 02/24/2022 3:03:06 PM By: [name] Entered By: Fredirick Maudlin on 02/24/2022 15:01:20 -------------------------------------------------------------------------------- Problem List Details Patient Name: Date of Service: [name], [place] [name]. 02/24/2022 2:00 PM Medical Record Number: [id number] Patient Account Number: [id number] Date of Birth/Sex: Treating RN: February 20, 1951 (71 y.o. M) Primary Care Provider: [name] Other Clinician: Referring  Provider: Treating Provider/Extender: [name] in Treatment: 11 Active Problems ICD-10 Encounter Code Description Active Date MDM Diagnosis L97.815 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 12/04/2021 No Yes involvement without evidence of necrosis DENNARD, VEZINA ([id number]) [phone].pdf Page 6 of [id number] 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 12/04/2021 No Yes E11.622 Type 2 diabetes mellitus with other skin ulcer 12/04/2021 No Yes I73.9 Peripheral vascular disease, unspecified 12/04/2021 No Yes L97.122 Non-pressure chronic ulcer of left thigh with fat layer exposed 01/14/2022 No Yes Inactive Problems Resolved Problems Electronic Signature(s) Signed: 02/24/2022 2:57:55 PM By: [name] Entered By: Fredirick Maudlin on 02/24/2022 14:57:55 -------------------------------------------------------------------------------- Progress Note Details Patient Name: Date of Service: [name], [place] [name]. 02/24/2022 2:00 PM Medical Record Number: [id number] Patient Account Number: [id number] Date of Birth/Sex: Treating RN: Jun 08, 1950 (71 y.o. M) Primary Care Provider:  [name] Other Clinician: Referring Provider: Treating Provider/Extender: [name] in Treatment: 11 Subjective Chief Complaint Information obtained from Patient Patient presents to the wound care center today with an open arterial ulcer to the right lower extremity in the setting of diabetes mellitus. he has had this problem for 7 months 12/04/2021: ulcer to right lower anterior tibial surface History of Present Illness (HPI) The following HPI elements were documented for the patient's wound: Location: right lateral calf closer to the knee Quality: Patient reports experiencing a dull pain to affected area(s). Severity: Patient states wound(s) are getting worse. Duration: Patient has had the wound for > 7 months prior to seeking treatment at the wound center Timing: Pain in wound is constant (hurts all the time) Context: The wound would happen gradually Modifying Factors: Patient wound(s)/ulcer(s) are worsening due to : no resolution and a white material at the base of the wound Associated Signs and Symptoms: Patient reports having:no discharge or purulent material 71 year old gentleman who has been referred to was from his PCP for a chronic ulceration on his right lower extremity which she's had for several weeks. Past medical history significant for diabetes mellitus type 2, hypertension, hyperlipidemia, anxiety, obesity, peripheral vascular disease and chronic kidney disease. He has never been a smoker. Most recent lab work done at his PCPs office showed a glucose of 217 milligrams per deciliter which is consistent with hyperglycemia. In January 2017 recent hemoglobin A1c values noted to be 8.2%. In April 2017, he has been seen at the vascular office by Dr. Trula Slade and Dr. Kellie Simmering for right leg claudication. He had a right superficial femoral artery stent in September 2012. Recent noninvasive vascular imaging done on 06/24/2015 showed a right ABI of 1.07  with a triphasic waveform and a right TBI of 0.81. The left was noncompressible with a triphasic waveform and a TBI of 1.17. Right lower extremity arterial duplex was unable to identify stent exit but they were elevated velocities at the stent and in the distal thigh with triphasic waveforms. Dr. Stephens Shire opinion was to return to the clinic in 6 months with ABI and right lower extremity arterial duplex studies to be done. He has seen a dermatologist who had done a biopsy and said it was benign and he was given a steroid cream. 10/23/2015 -- Pathology report of the biopsy done in April 2017 shows ulcer with underlying angiodermatitis consistent with stasis dermatitis. 10/30/2015 -- he is going for shoulder surgery in about 10 days' time and was asking about the perioperative care. His blood  sugars are running in the high 100s or low 200s. No hemoglobin A1c done recently. 11/20/2015 -- is back up to 2 weeks because of recent left shoulder surgery. 12/04/15; patient returns today with the wound for the most part looking healthy. No evidence of infection no debridement required. He is using Prisma for Michael [name] ([id number]) [id number] Page 7 of 11 weeks, without obvious improvement per her intake nurse. This started as several small open areas that were raised erythematous. He saw dermatology and at some point this was biopsied that just suggested stasis skin physiology. This certainly doesn't look like that 12/11/15; using Hydrafera Blue. Previous biopsy reviewed, no atypia PAS negative. He has a history of stents in the right leg however he does not appear to have primary arterial insufficiency ABI in this clinic was over 0.9. 12/25/2015 -- he has been approved for grafix and we will order for some to be applied next week 01/01/2016 -- he has had his first application of Grafix today 01/08/2016 -- he has had his second application of Grafix  today READMISSION 12/04/2021 This is a now 71 year old man who was followed in our clinic about 5 years ago for an ulceration on his right lateral lower leg, just distal to the knee. He has since undergone a number of revascularization procedures that have been complicated by restenosis and wound infections. During the course of this treatment, he developed a small ulcer on his right anterior tibial surface. Despite use of an Unna boot, the wound has continued to expand. He was referred to the wound care center by Dr. Trula Slade for further evaluation and management. On the right anterior tibial surface, there is an irregular wound with heavy slough and eschar accumulation. The periwound skin is intact but he does have 1-2+ pitting edema. There is no purulent drainage or malodor. 10/13; second visit for this man who has an ischemic wound in the setting of type 2 diabetes on the right anterior mid tibia area. He has been revascularized. Using Santyl gent 12/22/2021: The wound is about the same size this week. There is a lot of gray sloughy material on the surface, secondary to silver nitrate used to stop bleeding after what sounds like a fairly aggressive debridement last week. 12/30/2021: The wound is smaller this week and a bit cleaner. Edema control is excellent. There is still a bit of slough accumulation. 01/07/2022: The wound continues to contract and is much cleaner this week. Edema control is very good. He has small openings in his upper leg surgical scars, but he is going to be seeing Dr. Trula Slade next Monday and will have him take a look. He has been applying manuka honey to both of the sites. We have been using Santyl and gentamicin on his lower leg wound. 01/14/2022: He saw vascular surgery this week and was told that they were happy to have [place] manage the 2 wounds from his operation. Both of these have a light layer of slough on the surface. The more distal wound is a little bit dry. The  anterior tibial wound continues to accumulate a fair amount of slough. Edema control is good. 01/19/2022: Both upper leg wounds are smaller. The more distal is quite dry and cracked when I was examining it. The lower leg wound is more painful and the surrounding tissue is erythematous and indurated. 01/26/2022: The upper leg wounds are healed. The skin at the distal leg wound is quite dry. The lower leg wound is cleaner but still  fairly painful. His wrap slid again this week. 02/02/2022: We had good success keeping his wrap intact using a standard Unna boot. The lower leg wound continues to contract and is clean and less painful this week. 12/11; TheraSkin #1 12/18; TheraSkin #2. Wound looks as though it is contracted. Patient states that his pain was better in the leg. Secondary dressing and kerlix Coban 02/24/2022: The wound is smaller since I saw it last. It is less painful and there is good granulation tissue, including over the exposed tendon. Patient History Information obtained from Patient. Family History Cancer - Siblings, Heart Disease - Father, Hypertension - Father,Mother, No family history of Hereditary Spherocytosis, Kidney Disease, Seizures, Stroke, Thyroid Problems, Tuberculosis. Social History Former smoker - smokeless tobacco, Marital Status - Married, Alcohol Use - Rarely - WINE, Drug Use - No History, Caffeine Use - Daily - COFFEE. Medical History Ear/Nose/Mouth/Throat Patient has history of Chronic sinus problems/congestion - seasonal allergies Cardiovascular Patient has history of Hypertension, Peripheral Arterial Disease - STENTS, Peripheral Venous Disease Endocrine Patient has history of Type II Diabetes - last A1c- 8.2 Hospitalization/Surgery History - left shoulder surgery. Medical A Surgical History Notes nd Constitutional Symptoms (General Health) obesity , h/o (R) leg claudication (right fem stent September 2012) Cardiovascular hyperlipidemia Endocrine pt.  on diet intentionally losing 20 pounds since December 2016 in an effort to improve A1C levels Integumentary (Skin) psoriatic arthritis Musculoskeletal bilat knee pain identified as an ortho issue psoriatic arthritis Oncologic Prostate cancer 2018 Psychiatric anxiety Michael [name], Michael [name] ([id number]) [phone].pdf Page 8 of 11 Objective Constitutional Slightly hypertensive. No acute distress. Vitals Time Taken: 2:06 PM, Height: 72 in, Weight: 208 lbs, BMI: 28.2, Temperature: 98.4 F, Pulse: 97 bpm, Respiratory Rate: 16 breaths/min, Blood Pressure: 144/82 mmHg, Capillary Blood Glucose: 101 mg/dl. Respiratory Normal work of breathing on room air. General Notes: 02/24/2022: The wound is smaller since I saw it last. It is less painful and there is good granulation tissue, including over the exposed tendon. Integumentary (Hair, Skin) Wound #3 status is Open. Original cause of wound was Blister. The date acquired was: 09/30/2021. The wound has been in treatment 11 weeks. The wound is located on the Right,Anterior Lower Leg. The wound measures 3.5cm length x 3.4cm width x 0.2cm depth; 9.346cm^2 area and 1.869cm^3 volume. There is Fat Layer (Subcutaneous Tissue) exposed. There is no tunneling or undermining noted. There is a medium amount of serosanguineous drainage noted. The wound margin is distinct with the outline attached to the wound base. There is small (1-33%) red, pink granulation within the wound bed. There is a large (67-100%) amount of necrotic tissue within the wound bed including Adherent Slough. The periwound skin appearance had no abnormalities noted for texture. The periwound skin appearance had no abnormalities noted for color. The periwound skin appearance exhibited: Maceration. Periwound temperature was noted as No Abnormality. The periwound has tenderness on palpation. 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 There was a Selective/Open Wound Non-Viable Tissue Debridement with a total area of 11.9 sq cm performed by Celine Ahr,  Anderson Malta, MD. With the following instrument(s): Curette to remove Non-Viable tissue/material. Material removed includes [facility] after achieving pain control using Lidocaine 5% topical ointment. No specimens were taken. A time out was conducted at 14:39, prior to the start of the procedure. A Minimum amount of bleeding was controlled with Pressure. The procedure was tolerated well. Post Debridement Measurements: 3.5cm length x 3.4cm width x 0.2cm depth; 1.869cm^3 volume. Character of Wound/Ulcer Post Debridement requires further debridement. Severity of Tissue Post Debridement is: Fat layer exposed. Post procedure Diagnosis Wound #3: Same as Pre-Procedure General Notes: Scribed for Dr. Celine Ahr by Blanche East, RN. Pre-procedure diagnosis of Wound #3 is a Diabetic Wound/Ulcer of the Lower Extremity located on the Right,Anterior Lower Leg. A skin graft procedure using a bioengineered skin substitute/cellular or tissue based product was performed by Fredirick Maudlin, MD with the following instrument(s): Curette, Forceps, and Scissors. Theraskin was applied and secured with Staples. 21 sq cm of product was utilized and 18 sq cm was wasted due to size of wound. Post Application, adaptetc was applied. A Time Out was conducted at 14:41, prior to the start of the procedure. The procedure was tolerated  well with a pain level of 0 throughout and a pain level of 0 following the procedure. Post procedure Diagnosis Wound #3: Same as Pre-Procedure General Notes: Scribed for Dr. Celine Ahr by Blanche East, RN. Plan Follow-up Appointments: Return Appointment in 1 week. - Dr. Celine Ahr - room 1 or 4 Anesthetic: Wound #3 Right,Anterior Lower Leg: (In clinic) Topical Lidocaine 5% applied to wound bed Cellular or Tissue Based Products: Cellular or Tissue Based Product Type: - Theraskin #2 Cellular or Tissue Based Product applied to wound bed, secured with steri-strips, cover with Adaptic or Mepitel. (DO NOT REMOVE). Edema Control - Lymphedema / SCD / Other: Avoid standing for long periods of time. Michael [name], Michael [name] ([id number]) [phone].pdf Page 9 of 11 WOUND #3: - Lower Leg Wound Laterality: Right, Anterior Cleanser: Soap and Water 1 x Per Week/30 Days Discharge Instructions: May shower and wash wound with dial antibacterial soap and water prior to dressing change. Cleanser: Wound Cleanser 1 x Per Week/30 Days Discharge Instructions: Cleanse the wound with wound cleanser prior to applying a clean dressing using gauze sponges, not tissue or cotton balls. Peri-Wound Care: Sween Lotion (Moisturizing lotion) 1 x Per Week/30 Days Discharge Instructions: Apply moisturizing lotion as directed Prim Dressing: ADAPTIC TOUCH 3x4.25 (in/in) 1 x Per Week/30 Days ary Discharge Instructions: Apply to wound bed as instructed Prim Dressing: Promogran Prisma Matrix, 4.34 (sq in) (silver collagen) 1 x Per Week/30 Days ary Discharge Instructions: Moisten collagen with saline or hydrogel Secondary Dressing: ABD Pad, 8x10 1 x Per Week/30 Days Discharge Instructions: Apply over primary dressing as directed. Secondary Dressing: Woven Gauze Sponge, Non-Sterile 4x4 in 1 x Per Week/30 Days Discharge Instructions: Apply over primary dressing as directed. Secured With: [employer] Size 5, 10 (yds) 1 x  Per Week/30 Days Com pression Wrap: Unnaboot w/Calamine, 4x10 (in/yd) 1 x Per Week/30 Days Discharge Instructions: Apply Unnaboot to top of dressing and around ankles to prevent dressing from sliding Com pression Wrap: Kerlix Roll 4.5x3.1 (in/yd) 1 x Per Week/30 Days Discharge Instructions: Apply Kerlix and Coban compression as directed. Com pression Wrap: Coban Self-Adherent Wrap 4x5 (in/yd) 1 x Per Week/30 Days Discharge Instructions: Apply over Kerlix as directed. 02/24/2022: The wound is smaller since I saw it last. It is less painful and there is good granulation tissue, including over the exposed tendon. I used a  curette to debride some slough off of the wound surface. I then applied TheraSkin #3 to the wound, tucking it into the slitlike area in the center of his lesion. The excess was trimmed away. The skin substitute was secured with Adaptic and Steri-Strips and a gauze sponge was used as a bolster. Kerlix and Coban wrap. Follow-up in 1 week. Electronic Signature(s) Signed: 02/24/2022 3:02:12 PM By: [name] Entered By: Fredirick Maudlin on 02/24/2022 15:02:12 -------------------------------------------------------------------------------- HxROS Details Patient Name: Date of Service: HO [place], [place] [name]. 02/24/2022 2:00 PM Medical Record Number: [id number] Patient Account Number: [id number] Date of Birth/Sex: Treating RN: February 07, 1951 (71 y.o. M) Primary Care Provider: [name] Other Clinician: Referring Provider: Treating Provider/Extender: [name] in Treatment: 11 Information Obtained From Patient Constitutional Symptoms (General Health) Medical History: Past Medical History Notes: obesity , h/o (R) leg claudication (right fem stent September 2012) Ear/Nose/Mouth/Throat Medical History: Positive for: Chronic sinus problems/congestion - seasonal allergies Cardiovascular Medical History: Positive for: Hypertension;  Peripheral Arterial Disease - STENTS; Peripheral Venous Disease Past Medical History Notes: hyperlipidemia Endocrine Medical History: Positive for: Type II Diabetes - last A1c- 8.2 Past Medical History Notes: pt. on diet intentionally losing 20 pounds since December 2016 in an effort to improve A1C levels Kochel, Michael [name] ([id number]) [id number] Page 10 of 11 Time with diabetes: 9 YRS Treated with: Insulin Blood sugar tested every day: Yes Tested : 7-8 TIMES Integumentary (Skin) Medical History: Past Medical History Notes: psoriatic arthritis Musculoskeletal Medical History: Past Medical History Notes: bilat knee pain identified as an ortho issue psoriatic arthritis Oncologic Medical History: Past Medical History Notes: Prostate cancer 2018 Psychiatric Medical History: Past Medical History Notes: anxiety HBO Extended History Items Ear/Nose/Mouth/Throat: Chronic sinus problems/congestion Immunizations Pneumococcal Vaccine: Received Pneumococcal Vaccination: Yes Received Pneumococcal Vaccination On or After 60th Birthday: Yes Implantable Devices None Hospitalization / Surgery History Type of Hospitalization/Surgery left shoulder surgery Family and Social History Cancer: Yes - Siblings; Heart Disease: Yes - Father; Hereditary Spherocytosis: No; Hypertension: Yes - Father,Mother; Kidney Disease: No; Seizures: No; Stroke: No; Thyroid Problems: No; Tuberculosis: No; Former smoker - smokeless tobacco; Marital Status - Married; Alcohol Use: Rarely - WINE; Drug Use: No History; Caffeine Use: Daily - COFFEE; Financial Concerns: No; Food, Clothing or Shelter Needs: No; Support System Lacking: No; Transportation Concerns: No Electronic Signature(s) Signed: 02/24/2022 3:03:06 PM By: [name] Entered By: Fredirick Maudlin on 02/24/2022 14:59:11 -------------------------------------------------------------------------------- SuperBill  Details Patient Name: Date of Service: [name], [place] [name]. 02/24/2022 Medical Record Number: [id number] Patient Account Number: [id number] Date of Birth/Sex: Treating RN: [birth year] (71 y.o. 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 E11.622 Type 2 diabetes mellitus with other skin ulcer I73.9 Peripheral vascular disease, unspecified L97.122 Non-pressure chronic ulcer of left thigh with fat layer exposed Facility Procedures : CPT4 Code Description: [id number] - SKIN SUB GRAFT TRNK/ARM/LEG ICD-10 Diagnosis Description L97.815 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 involvemen Modifier: t without evidence Quantity: 1 of necrosis : CPT4 Code Description: [id number] Q4121- Theraskin per 1sq cm Large -39 sq cm ICD-10 Diagnosis Description L97.815 Non-pressure chronic  ulcer of other part of right lower leg with muscle involvemen Modifier: t without evidence Quantity: 39 of necrosis Physician Procedures : CPT4 Code Description Modifier [id number] - WC PHYS LEVEL 3 - EST PT 25 ICD-10 Diagnosis Description L97.815 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 without evidence o E11.622 Type 2 diabetes mellitus with  other skin ulcer I70.239 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 I73.9 Peripheral vascular disease, unspecified Quantity: 1 f necrosis : [id number] - WC PHYS SKIN SUB GRAFT TRNK/ARM/LEG ICD-10 Diagnosis  Description L97.815 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 without evidence o Quantity: 1 f necrosis Electronic Signature(s) Signed: 02/24/2022 4:02:04 PM By: [name] Signed: 02/25/2022 8:36:05 AM By: [name] Previous Signature: 02/24/2022 3:59:39 PM Version By: [name] Previous Signature: 02/24/2022 3:02:38 PM Version By: [name] Entered By: Blanche East on 02/24/2022 16:02:04

## 2022-02-27 NOTE — Progress Notes (Signed)
DEVELLE, SIEVERS ([id number]) [id number].pdf Page 1 of 8 Visit Report for 02/24/2022 Arrival Information Details Patient Name: Date of Service: Michael [name], [name] 02/24/2022 2:00 PM Medical Record Number: [id number] Patient Account Number: [id number] Date of Birth/Sex: Treating [name]: 1950-04-16 (71 y.o. Michael [name]: Michael [name] Other Clinician: Referring Hunter Bachar: Treating Ellison Leisure/Extender: Michael [name] in Treatment: 11 Visit Information History Since Last Visit Added or deleted any medications: No Patient Arrived: Ambulatory Any new allergies or adverse reactions: No Arrival Time: 14:05 Had a fall or experienced change in No Accompanied By: wife activities of daily living that may affect Transfer Assistance: None risk of falls: Patient Requires Transmission-Based Precautions: No Signs or symptoms of abuse/neglect since last visito No Patient Has Alerts: Yes Hospitalized since last visit: No Patient Alerts: Patient on Blood Thinner Implantable device outside of the clinic excluding No plavix cellular tissue based products placed in the center since last visit: Has Compression in Place as Prescribed: Yes Pain Present Now: No Electronic Signature(s) Signed: 02/27/2022 12:15:24 PM By: Michael [name] Entered By: Michael East on 02/24/2022 14:06:55 -------------------------------------------------------------------------------- Encounter Discharge Information Details Patient Name: Date of Service: Michael [place], [name]. 02/24/2022 2:00 PM Medical Record Number: [id number] Patient Account Number: [id number] Date of Birth/Sex: Treating [name]: 01-Dec-1950 (71 y.o. Michael [name]: Michael [name] Other Clinician: Referring Jermy Couper: Treating Persephonie Hegwood/Extender: Michael [name] in Treatment: 11 Encounter Discharge Information Items Post Procedure  Vitals Discharge Condition: Stable Temperature (F): 98.4 Ambulatory Status: Ambulatory Pulse (bpm): 97 Discharge Destination: Home Respiratory Rate (breaths/min): 16 Transportation: Private Auto Blood Pressure (mmHg): 144/82 Accompanied By: wife Schedule Follow-up Appointment: Yes Clinical Summary of Care: Electronic Signature(s) Signed: 02/27/2022 12:15:24 PM By: Michael [name] Entered By: Michael East on 02/24/2022 14:45:25 Michael [name], Michael [name] ([id number]) [id number] Page 2 of 8 -------------------------------------------------------------------------------- Lower Extremity Assessment Details Patient Name: Date of Service: Michael Michael [name], [name] 02/24/2022 2:00 PM Medical Record Number: [id number] Patient Account Number: [id number] Date of Birth/Sex: Treating [name]: 09-27-1950 (71 y.o. Michael [name]: Michael [name] Other Clinician: Referring Rahel Carlton: Treating Nyeli Holtmeyer/Extender: Michael [name] in Treatment: 11 Edema Assessment Assessed: Michael [name]: No] Michael [name]: No] Edema: [Left: Ye] [Right: s] Calf Left: Right: Point of Measurement: 35 cm From Medial Instep 34.5 cm Ankle Left: Right: Point of Measurement: 12 cm From Medial Instep 21 cm Vascular Assessment Pulses: Dorsalis Pedis Palpable: [Right:Yes] Electronic Signature(s) Signed: 02/27/2022 12:15:24 PM By: Michael [name] Entered By: Michael East on 02/24/2022 14:10:34 -------------------------------------------------------------------------------- Multi Wound Chart Details Patient Name: Date of Service: Michael Michael [name], [name] 02/24/2022 2:00 PM Medical Record Number: [id number] Patient Account Number: [id number] Date of Birth/Sex: Treating [name]: 20-Apr-1950 (71 y.o. M) Primary Care Apryll Hinkle: Michael [name] Other Clinician: Referring Davis Vannatter: Treating Audrianna Driskill/Extender: Michael [name] in Treatment: 11 Vital Signs Height(in):  72 Capillary Blood Glucose(mg/dl): 101 Weight(lbs): 208 Pulse(bpm): 50 Body Mass Index(BMI): 28.2 Blood Pressure(mmHg): 144/82 Temperature(F): 98.4 Respiratory Rate(breaths/min): 16 [3:Photos:] [N/A:N/A] Right, Anterior Lower Leg N/A N/A Wound Location: Blister N/A N/A Wounding Event: Diabetic Wound/Ulcer of the Lower N/A N/A Primary Etiology: Extremity Chronic sinus problems/congestion, N/A N/A Comorbid History: Hypertension, Peripheral Arterial Disease, Peripheral Venous Disease, Type II Diabetes 09/30/2021 N/A N/A Date Acquired: 11 N/A N/A Weeks of Treatment: Open N/A N/A Wound Status: No N/A N/A Wound Recurrence: 3.5x3.4x0.2 N/A N/A Measurements L x W x D (cm) 9.346 N/A N/A A (cm) :  rea 1.869 N/A N/A Volume (cm) : 46.80% N/A N/A % Reduction in A rea: 64.50% N/A N/A % Reduction in Volume: Grade 2 N/A N/A Classification: Medium N/A N/A Exudate A mount: Serosanguineous N/A N/A Exudate Type: red, brown N/A N/A Exudate Color: Distinct, outline attached N/A N/A Wound Margin: Small (1-33%) N/A N/A Granulation A mount: Red, Pink N/A N/A Granulation Quality: Large (67-100%) N/A N/A Necrotic A mount: Fat Layer (Subcutaneous Tissue): Yes N/A N/A Exposed Structures: Fascia: No Tendon: No Muscle: No Joint: No Bone: No Small (1-33%) N/A N/A Epithelialization: Debridement - Selective/Open Wound N/A N/A Debridement: Pre-procedure Verification/Time Out 14:39 N/A N/A Taken: Lidocaine 5% topical ointment N/A N/A Pain Control: Slough N/A N/A Tissue Debrided: Non-Viable Tissue N/A N/A Level: 11.9 N/A N/A Debridement A (sq cm): rea Curette N/A N/A Instrument: Minimum N/A N/A Bleeding: Pressure N/A N/A Hemostasis A chieved: Procedure was tolerated well N/A N/A Debridement Treatment Response: 3.5x3.4x0.2 N/A N/A Post Debridement Measurements L x W x D (cm) 1.869 N/A N/A Post Debridement Volume: (cm) No Abnormalities Noted N/A N/A Periwound  Skin Texture: Maceration: Yes N/A N/A Periwound Skin Moisture: Atrophie Michael: No N/A N/A Periwound Skin Color: Cyanosis: No Erythema: No No Abnormality N/A N/A Temperature: Yes N/A N/A Tenderness on Palpation: Cellular or Tissue Based Product N/A N/A Procedures Performed: Debridement Treatment Notes Wound #3 (Lower Leg) Wound Laterality: Right, Anterior Cleanser Soap and Water Discharge Instruction: May shower and wash wound with dial antibacterial soap and water prior to dressing change. Wound Cleanser Discharge Instruction: Cleanse the wound with wound cleanser prior to applying a clean dressing using gauze sponges, not tissue or cotton balls. Peri-Wound Care Sween Lotion (Moisturizing lotion) Discharge Instruction: Apply moisturizing lotion as directed Topical Primary Dressing ADAPTIC TOUCH 3x4.25 (in/in) Discharge Instruction: Apply to wound bed as instructed Promogran Prisma Matrix, 4.34 (sq in) (silver collagen) Discharge Instruction: Moisten collagen with saline or hydrogel Secondary Dressing ABD Pad, 8x10 Discharge Instruction: Apply over primary dressing as directed. Michael [name], Michael [name] ([id number]) [phone].pdf Page 4 of 8 Woven Gauze Sponge, Non-Sterile 4x4 in Discharge Instruction: Apply over primary dressing as directed. Secured With [employer] Size 5, 10 (yds) Compression Wrap Unnaboot w/Calamine, 4x10 (in/yd) Discharge Instruction: Apply Unnaboot to top of dressing and around ankles to prevent dressing from sliding Kerlix Roll 4.5x3.1 (in/yd) Discharge Instruction: Apply Kerlix and Coban compression as directed. Coban Self-Adherent Wrap 4x5 (in/yd) Discharge Instruction: Apply over Kerlix as directed. Compression Stockings Add-Ons Electronic Signature(s) Signed: 02/24/2022 2:58:05 PM By: [name] Entered By: Fredirick Maudlin on 02/24/2022  14:58:05 -------------------------------------------------------------------------------- Multi-Disciplinary Care Plan Details Patient Name: Date of Service: Michael [place], [place] [name]. 02/24/2022 2:00 PM Medical Record Number: [id number] Patient Account Number: [id number] Date of Birth/Sex: Treating [name]: 10/07/1950 (71 y.o. Michael [name] Primary Care Jaylynne Birkhead: Michael [name] Other Clinician: Referring Brittini Brubeck: Treating Jonthan Leite/Extender: Michael [name] in Treatment: 11 Active Inactive Nutrition Nursing Diagnoses: Potential for alteratiion in Nutrition/Potential for imbalanced nutrition Goals: Patient/caregiver agrees to and verbalizes understanding of need to use nutritional supplements and/or vitamins as prescribed Date Initiated: 12/04/2021 Target Resolution Date: 02/27/2022 Goal Status: Active Patient/caregiver verbalizes understanding of need to maintain therapeutic glucose control per primary care physician Date Initiated: 12/04/2021 Target Resolution Date: 02/27/2022 Goal Status: Active Interventions: Assess patient nutrition upon admission and as needed per policy Provide education on elevated blood sugars and impact on wound healing Provide education on nutrition Treatment Activities: Education provided on Nutrition : 01/19/2022 Notes: Wound/Skin Impairment Nursing Diagnoses: Impaired tissue  integrity Knowledge deficit related to ulceration/compromised skin integrity Goals: Patient/caregiver will verbalize understanding of skin care regimen Michael [name], Michael [name] ([id number]) [id number].pdf Page 5 of 8 Date Initiated: 12/04/2021 Date Inactivated: 02/02/2022 Target Resolution Date: 01/28/2022 Goal Status: Met Ulcer/skin breakdown will have a volume reduction of 30% by week 4 Date Initiated: 12/04/2021 Date Inactivated: 01/07/2022 Target Resolution Date: 12/26/2021 Goal Status: Met Ulcer/skin breakdown will have a volume reduction  of 50% by week 8 Date Initiated: 01/07/2022 Target Resolution Date: 02/27/2022 Goal Status: Active Interventions: Assess ulceration(s) every visit Provide education on ulcer and skin care Notes: Electronic Signature(s) Signed: 02/27/2022 12:15:24 PM By: Michael [name] Entered By: Michael East on 02/24/2022 14:17:48 -------------------------------------------------------------------------------- Pain Assessment Details Patient Name: Date of Service: Michael FFMA [place], [name] 02/24/2022 2:00 PM Medical Record Number: [id number] Patient Account Number: [id number] Date of Birth/Sex: Treating [name]: 08-Mar-1950 (71 y.o. Michael [name] Primary Care Shatona Andujar: Michael [name] Other Clinician: Referring Layce Sprung: Treating Mattie Nordell/Extender: Michael [name] in Treatment: 11 Active Problems Location of Pain Severity and Description of Pain Patient Has Paino No Site Locations Rate the pain. Current Pain Level: 0 Pain Management and Medication Current Pain Management: Electronic Signature(s) Signed: 02/27/2022 12:15:24 PM By: Michael [name] Entered By: Michael East on 02/24/2022 14:07:39 Michael [name], Michael [name] ([id number]) [id number] Page 6 of 8 -------------------------------------------------------------------------------- Patient/Caregiver Education Details Patient Name: Date of Service: Michael Virgie Dad 12/26/2023andnbsp2:00 PM Medical Record Number: [id number] Patient Account Number: [id number] Date of Birth/Gender: Treating [name]: 10/26/1950 (71 y.o. Michael [name] Primary Care Physician: Michael [name] Other Clinician: Referring Physician: Treating Physician/Extender: Michael [name] in Treatment: 11 Education Assessment Education Provided To: Patient Education Topics Provided Wound Debridement: Methods: Explain/Verbal Responses: Reinforcements needed, State content correctly Wound/Skin  Impairment: Responses: Reinforcements needed, State content correctly Electronic Signature(s) Signed: 02/27/2022 12:15:24 PM By: Michael [name] Entered By: Michael East on 02/24/2022 14:40:06 -------------------------------------------------------------------------------- Wound Assessment Details Patient Name: Date of Service: Michael [place], [place] [name]. 02/24/2022 2:00 PM Medical Record Number: [id number] Patient Account Number: [id number] Date of Birth/Sex: Treating [name]: 29-Jul-1950 (71 y.o. Michael [name] Primary Care Velmer Woelfel: Michael [name] Other Clinician: Referring Dickie Labarre: Treating Helio Lack/Extender: Michael [name] in Treatment: 11 Wound Status Wound Number: 3 Primary Diabetic Wound/Ulcer of the Lower Extremity Etiology: Wound Location: Right, Anterior Lower Leg Wound Open Wounding Event: Blister Status: Date Acquired: 09/30/2021 Comorbid Chronic sinus problems/congestion, Hypertension, Peripheral Weeks Of Treatment: 11 History: Arterial Disease, Peripheral Venous Disease, Type II Diabetes Clustered Wound: No Photos Wound Measurements Length: (cm) 3.5 Width: (cm) 3.4 Depth: (cm) 0.2 Area: (cm) 9.346 Volume: (cm) 1.869 Michael [name], Michael [name] ([id number]) % Reduction in Area: 46.8% % Reduction in Volume: 64.5% Epithelialization: Small (1-33%) Tunneling: No Undermining: No [id number] Page 7 of 8 Wound Description Classification: Grade 2 Wound Margin: Distinct, outline attached Exudate Amount: Medium Exudate Type: Serosanguineous Exudate Color: red, brown Foul Odor After Cleansing: No Slough/Fibrino Yes Wound Bed Granulation Amount: Small (1-33%) Exposed Structure Granulation Quality: Red, Pink Fascia Exposed: No Necrotic Amount: Large (67-100%) Fat Layer (Subcutaneous Tissue) Exposed: Yes Necrotic Quality: Adherent Slough Tendon Exposed: No Muscle Exposed: No Joint Exposed: No Bone Exposed: No Periwound Skin  Texture Texture Color No Abnormalities Noted: Yes No Abnormalities Noted: Yes Moisture Temperature / Pain No Abnormalities Noted: No Temperature: No Abnormality Maceration: Yes Tenderness on Palpation: Yes Treatment Notes Wound #3 (Lower Leg) Wound Laterality: Right, Anterior Cleanser Soap and Water Discharge Instruction: May shower and wash wound with  dial antibacterial soap and water prior to dressing change. Wound Cleanser Discharge Instruction: Cleanse the wound with wound cleanser prior to applying a clean dressing using gauze sponges, not tissue or cotton balls. Peri-Wound Care Sween Lotion (Moisturizing lotion) Discharge Instruction: Apply moisturizing lotion as directed Topical Primary Dressing ADAPTIC TOUCH 3x4.25 (in/in) Discharge Instruction: Apply to wound bed as instructed Promogran Prisma Matrix, 4.34 (sq in) (silver collagen) Discharge Instruction: Moisten collagen with saline or hydrogel Secondary Dressing ABD Pad, 8x10 Discharge Instruction: Apply over primary dressing as directed. Woven Gauze Sponge, Non-Sterile 4x4 in Discharge Instruction: Apply over primary dressing as directed. Secured With [employer] Size 5, 10 (yds) Compression Wrap Unnaboot w/Calamine, 4x10 (in/yd) Discharge Instruction: Apply Unnaboot to top of dressing and around ankles to prevent dressing from sliding Kerlix Roll 4.5x3.1 (in/yd) Discharge Instruction: Apply Kerlix and Coban compression as directed. Coban Self-Adherent Wrap 4x5 (in/yd) Discharge Instruction: Apply over Kerlix as directed. Compression Stockings Add-Ons Electronic Signature(s) Signed: 02/27/2022 12:15:24 PM By: Michael [name] Entered By: Michael East on 02/24/2022 14:17:11 Michael [name] ([id number]) [id number] Page 8 of 8 -------------------------------------------------------------------------------- Vitals Details Patient Name: Date of Service: Michael FFMA [place], [name]  02/24/2022 2:00 PM Medical Record Number: [id number] Patient Account Number: [id number] Date of Birth/Sex: Treating [name]: 11/04/1950 (71 y.o. Michael [name] Primary Care Sibyl Mikula: Michael [name] Other Clinician: Referring Christin Moline: Treating Ainara Eldridge/Extender: Michael [name] in Treatment: 11 Vital Signs Time Taken: 14:06 Temperature (F): 98.4 Height (in): 72 Pulse (bpm): 97 Weight (lbs): 208 Respiratory Rate (breaths/min): 16 Body Mass Index (BMI): 28.2 Blood Pressure (mmHg): 144/82 Capillary Blood Glucose (mg/dl): 101 Reference Range: 80 - 120 mg / dl Electronic Signature(s) Signed: 02/27/2022 12:15:24 PM By: Michael [name] Entered By: Michael East on 02/24/2022 14:07:13

## 2022-03-03 ENCOUNTER — Encounter (HOSPITAL_BASED_OUTPATIENT_CLINIC_OR_DEPARTMENT_OTHER): Payer: Medicare Other | Attending: General Surgery | Admitting: General Surgery

## 2022-03-03 DIAGNOSIS — I70239 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: Secondary | ICD-10-CM | POA: Insufficient documentation

## 2022-03-03 DIAGNOSIS — E1122 Type 2 diabetes mellitus with diabetic chronic kidney disease: Secondary | ICD-10-CM | POA: Diagnosis not present

## 2022-03-03 DIAGNOSIS — I129 Hypertensive chronic kidney disease with stage 1 through stage 4 chronic kidney disease, or unspecified chronic kidney disease: Secondary | ICD-10-CM | POA: Diagnosis not present

## 2022-03-03 DIAGNOSIS — L97815 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: Secondary | ICD-10-CM | POA: Insufficient documentation

## 2022-03-03 DIAGNOSIS — E1151 Type 2 diabetes mellitus with diabetic peripheral angiopathy without gangrene: Secondary | ICD-10-CM | POA: Insufficient documentation

## 2022-03-03 DIAGNOSIS — E669 Obesity, unspecified: Secondary | ICD-10-CM | POA: Diagnosis not present

## 2022-03-03 DIAGNOSIS — E11622 Type 2 diabetes mellitus with other skin ulcer: Secondary | ICD-10-CM | POA: Diagnosis not present

## 2022-03-03 DIAGNOSIS — N189 Chronic kidney disease, unspecified: Secondary | ICD-10-CM | POA: Insufficient documentation

## 2022-03-03 DIAGNOSIS — L97122 Non-pressure chronic ulcer of left thigh with fat layer exposed: Secondary | ICD-10-CM | POA: Diagnosis not present

## 2022-03-03 NOTE — Progress Notes (Signed)
Michael [name], Michael [name] ([id number]) [id number] Page 1 of 1 Visit Report for 03/03/2022 SuperBill Details Patient Name: Date of Service: HO FFMA [place], [name] 03/03/2022 Medical Record Number: [id number] Patient Account Number: [id number] Date of Birth/Sex: Treating RN: 09/19/1950 (72 y.o. 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

## 2022-03-03 NOTE — Progress Notes (Signed)
NISSAN, FRAZZINI ([id number]) [id number] Page 1 of 7 Visit Report for 03/03/2022 Arrival Information Details Patient Name: Date of Service: HO FFMA [place], [name] 03/03/2022 9:15 A M Medical Record Number: [id number] Patient Account Number: [id number] Date of Birth/Sex: Treating RN: 09-11-1950 (72 y.o. [name] Primary Care Dannis Deroche: [name] Other Clinician: Referring Madden Piazza: Treating Sya Nestler/Extender: [name] in Treatment: 12 Visit Information History Since Last Visit Added or deleted any medications: No Patient Arrived: Ambulatory Any new allergies or adverse reactions: No Arrival Time: 09:11 Had a fall or experienced change in No Accompanied By: wife activities of daily living that may affect Transfer Assistance: None risk of falls: Patient Requires Transmission-Based Precautions: No Signs or symptoms of abuse/neglect since last visito No Patient Has Alerts: Yes Hospitalized since last visit: No Patient Alerts: Patient on Blood Thinner Implantable device outside of the clinic excluding No plavix cellular tissue based products placed in the center since last visit: Has Dressing in Place as Prescribed: Yes Has Compression in Place as Prescribed: Yes Pain Present Now: Yes Electronic Signature(s) Signed: 03/03/2022 3:22:54 PM By: Adline Peals Entered By: Adline Peals on 03/03/2022 09:12:13 -------------------------------------------------------------------------------- Clinic Level of Care Assessment Details Patient Name: Date of Service: [name], [name] 03/03/2022 9:15 A M Medical Record Number: [id number] Patient Account Number: [id number] Date of Birth/Sex: Treating RN: 10-06-1950 (72 y.o. [name] Primary Care Meshilem Machuca: [name] Other Clinician: Referring Kisean Rollo: Treating Janiylah Hannis/Extender: [name] in Treatment: 12 Clinic Level of  Care Assessment Items TOOL 4 Quantity Score X- 1 0 Use when only an EandM is performed on FOLLOW-UP visit ASSESSMENTS - Nursing Assessment / Reassessment '[]'$  - 0 Reassessment of Co-morbidities (includes updates in patient status) X- 1 5 Reassessment of Adherence to Treatment Plan ASSESSMENTS - Wound and Skin A ssessment / Reassessment '[]'$  - 0 Simple Wound Assessment / Reassessment - one wound '[]'$  - 0 Complex Wound Assessment / Reassessment - multiple wounds '[]'$  - 0 Dermatologic / Skin Assessment (not related to wound area) ASSESSMENTS - Focused Assessment '[]'$  - 0 Circumferential Edema Measurements - multi extremities '[]'$  - 0 Nutritional Assessment / Counseling / Intervention BETTY, BROOKS ([id number]) [id number] Page 2 of 7 '[]'$  - 0 Lower Extremity Assessment (monofilament, tuning fork, pulses) '[]'$  - 0 Peripheral Arterial Disease Assessment (using hand held doppler) ASSESSMENTS - Ostomy and/or Continence Assessment and Care '[]'$  - 0 Incontinence Assessment and Management '[]'$  - 0 Ostomy Care Assessment and Management (repouching, etc.) PROCESS - Coordination of Care X - Simple Patient / Family Education for ongoing care 1 15 '[]'$  - 0 Complex (extensive) Patient / Family Education for ongoing care X- 1 10 Staff obtains Programmer, systems, Records, T Results / Process Orders est '[]'$  - 0 Staff telephones HHA, Nursing Homes / Clarify orders / etc '[]'$  - 0 Routine Transfer to another Facility (non-emergent condition) '[]'$  - 0 Routine Hospital Admission (non-emergent condition) '[]'$  - 0 New Admissions / Biomedical engineer / Ordering NPWT Apligraf, etc. , '[]'$  - 0 Emergency Hospital Admission (emergent condition) X- 1 10 Simple Discharge Coordination '[]'$  - 0 Complex (extensive) Discharge Coordination PROCESS - Special Needs '[]'$  - 0 Pediatric / Minor Patient Management '[]'$  - 0 Isolation Patient Management '[]'$  - 0 Hearing / Language / Visual special needs '[]'$  -  0 Assessment of Community assistance (transportation, D/C planning, etc.) '[]'$  - 0 Additional assistance / Altered mentation '[]'$  - 0 Support Surface(s) Assessment (bed, cushion, seat, etc.) INTERVENTIONS - Wound  Cleansing / Measurement '[]'$  - 0 Simple Wound Cleansing - one wound '[]'$  - 0 Complex Wound Cleansing - multiple wounds '[]'$  - 0 Wound Imaging (photographs - any number of wounds) '[]'$  - 0 Wound Tracing (instead of photographs) '[]'$  - 0 Simple Wound Measurement - one wound '[]'$  - 0 Complex Wound Measurement - multiple wounds INTERVENTIONS - Wound Dressings '[]'$  - 0 Small Wound Dressing one or multiple wounds X- 1 15 Medium Wound Dressing one or multiple wounds '[]'$  - 0 Large Wound Dressing one or multiple wounds '[]'$  - 0 Application of Medications - topical '[]'$  - 0 Application of Medications - injection INTERVENTIONS - Miscellaneous '[]'$  - 0 External ear exam '[]'$  - 0 Specimen Collection (cultures, biopsies, blood, body fluids, etc.) '[]'$  - 0 Specimen(s) / Culture(s) sent or taken to Lab for analysis '[]'$  - 0 Patient Transfer (multiple staff / Civil Service fast streamer / Similar devices) '[]'$  - 0 Simple Staple / Suture removal (25 or less) '[]'$  - 0 Complex Staple / Suture removal (26 or more) '[]'$  - 0 Hypo / Hyperglycemic Management (close monitor of Blood Glucose) BRENTIN, SHIN C ([id number]) [id number] Page 3 of 7 '[]'$  - 0 Ankle / Brachial Index (ABI) - do not check if billed separately X- 1 5 Vital Signs Has the patient been seen at the hospital within the last three years: Yes Total Score: 60 Level Of Care: New/Established - Level 2 Electronic Signature(s) Signed: 03/03/2022 3:22:54 PM By: Adline Peals Entered By: Adline Peals on 03/03/2022 09:30:51 -------------------------------------------------------------------------------- Encounter Discharge Information Details Patient Name: Date of Service: [name], [place]. 03/03/2022 9:15 A M Medical Record Number:  [id number] Patient Account Number: [id number] Date of Birth/Sex: Treating RN: August 01, 1950 (71 y.o. [name] Primary Care Wylie Russon: [name] Other Clinician: Referring Vickki Igou: Treating Carrington Olazabal/Extender: [name] in Treatment: 12 Encounter Discharge Information Items Discharge Condition: Stable Ambulatory Status: Ambulatory Discharge Destination: Home Transportation: Private Auto Accompanied By: wife Schedule Follow-up Appointment: Yes Clinical Summary of Care: Patient Declined Electronic Signature(s) Signed: 03/03/2022 3:22:54 PM By: Adline Peals Entered By: Adline Peals on 03/03/2022 09:31:23 -------------------------------------------------------------------------------- Lower Extremity Assessment Details Patient Name: Date of Service: [name], [place]. 03/03/2022 9:15 A M Medical Record Number: [id number] Patient Account Number: [id number] Date of Birth/Sex: Treating RN: 19-Aug-1950 (72 y.o. [name] Primary Care Yochanan Eddleman: [name] Other Clinician: Referring Slade Pierpoint: Treating Makira Holleman/Extender: [name] in Treatment: 12 Edema Assessment Assessed: Shirlyn Goltz: No] Patrice Paradise: No] Edema: [Left: Ye] [Right: s] Calf Left: Right: Point of Measurement: 35 cm From Medial Instep 34 cm Ankle Left: Right: Point of Measurement: 12 cm From Medial Instep 24.2 cm Vascular Assessment Left: [id number] Page 4 of 7Right:] Pulses: Dorsalis Pedis Palpable: [id number] Page 4 of 7Yes] Electronic Signature(s) Signed: 03/03/2022 3:22:54 PM By: Adline Peals Entered By: Adline Peals on 03/03/2022 09:14:59 -------------------------------------------------------------------------------- Pain Assessment Details Patient Name: Date of Service: [name], [name] 03/03/2022 9:15 A M Medical Record Number: [id number] Patient Account  Number: [id number] Date of Birth/Sex: Treating RN: 12/23/1950 (72 y.o. [name] Primary Care Aydenn Gervin: [name] Other Clinician: Referring Cynthea Zachman: Treating Fallynn Gravett/Extender: [name] in Treatment: 12 Active Problems Location of Pain Severity and Description of Pain Patient Has Paino Yes Site Locations Pain Location: Pain in Ulcers Duration of the Pain. Constant / Intermittento Intermittent Rate the pain. Current Pain Level: 7 Character of Pain Describe the Pain: Throbbing Pain Management and Medication Current Pain Management: Medication: Yes  Electronic Signature(s) Signed: 03/03/2022 3:22:54 PM By: Sabas Sous By: Adline Peals on 03/03/2022 09:12:41 -------------------------------------------------------------------------------- Patient/Caregiver Education Details Patient Name: Date of Service: [name], [name] 1/2/2024andnbsp9:15 [place] Record Number: [id number] Patient Account Number: [id number] Date of Birth/Gender: Treating RN: [birth year] (72 y.o. Teven, Mittman, [place] C ([id number]) [id number] Page 5 of 7 Primary Care Physician: [name] Other Clinician: Referring Physician: Treating Physician/Extender: [name] in Treatment: 12 Education Assessment Education Provided To: Patient Education Topics Provided Safety: Methods: Explain/Verbal Responses: Reinforcements needed, State content correctly Electronic Signature(s) Signed: 03/03/2022 3:22:54 PM By: Adline Peals Entered By: Adline Peals on 03/03/2022 09:31:09 -------------------------------------------------------------------------------- Wound Assessment Details Patient Name: Date of Service: [name], [name] 03/03/2022 9:15 A M Medical Record Number: [id number] Patient Account Number: [id number] Date of Birth/Sex: Treating RN: [birth year] (72  y.o. [name] Primary Care Aadit Hagood: [name] Other Clinician: Referring Dala Breault: Treating Carolena Fairbank/Extender: [name] in Treatment: 12 Wound Status Wound Number: 3 Primary Diabetic Wound/Ulcer of the Lower Extremity Etiology: Wound Location: Right, Anterior Lower Leg Wound Open Wounding Event: Blister Status: Date Acquired: 09/30/2021 Comorbid Chronic sinus problems/congestion, Hypertension, Peripheral Weeks Of Treatment: 12 History: Arterial Disease, Peripheral Venous Disease, Type II Diabetes Clustered Wound: No Wound Measurements Length: (cm) 3.5 Width: (cm) 3.4 Depth: (cm) 0.2 Area: (cm) 9.346 Volume: (cm) 1.869 % Reduction in Area: 46.8% % Reduction in Volume: 64.5% Epithelialization: Small (1-33%) Wound Description Classification: Grade 2 Wound Margin: Distinct, outline attached Exudate Amount: Medium Exudate Type: Serosanguineous Exudate Color: red, brown Foul Odor After Cleansing: No Slough/Fibrino Yes Wound Bed Granulation Amount: Small (1-33%) Exposed Structure Granulation Quality: Red, Pink Fascia Exposed: No Necrotic Amount: Large (67-100%) Fat Layer (Subcutaneous Tissue) Exposed: Yes Necrotic Quality: Adherent Slough Tendon Exposed: No Muscle Exposed: No Joint Exposed: No Bone Exposed: No Periwound Skin Texture Texture Color No Abnormalities Noted: Yes No Abnormalities Noted: Yes Moisture Temperature / Pain GURDEEP, KEESEY ([id number]) [id number] Page 6 of 7 No Abnormalities Noted: No Temperature: No Abnormality Maceration: Yes Tenderness on Palpation: Yes Treatment Notes Wound #3 (Lower Leg) Wound Laterality: Right, Anterior Cleanser Soap and Water Discharge Instruction: May shower and wash wound with dial antibacterial soap and water prior to dressing change. Wound Cleanser Discharge Instruction: Cleanse the wound with wound cleanser prior to applying a clean  dressing using gauze sponges, not tissue or cotton balls. Peri-Wound Care Sween Lotion (Moisturizing lotion) Discharge Instruction: Apply moisturizing lotion as directed Topical Primary Dressing ADAPTIC TOUCH 3x4.25 (in/in) Discharge Instruction: Apply to wound bed as instructed Promogran Prisma Matrix, 4.34 (sq in) (silver collagen) Discharge Instruction: Moisten collagen with saline or hydrogel Secondary Dressing ABD Pad, 8x10 Discharge Instruction: Apply over primary dressing as directed. Woven Gauze Sponge, Non-Sterile 4x4 in Discharge Instruction: Apply over primary dressing as directed. Secured With [employer] Size 5, 10 (yds) Compression Wrap Unnaboot w/Calamine, 4x10 (in/yd) Discharge Instruction: Apply Unnaboot to top of dressing and around ankles to prevent dressing from sliding Kerlix Roll 4.5x3.1 (in/yd) Discharge Instruction: Apply Kerlix and Coban compression as directed. Coban Self-Adherent Wrap 4x5 (in/yd) Discharge Instruction: Apply over Kerlix as directed. Compression Stockings Add-Ons Electronic Signature(s) Signed: 03/03/2022 3:22:54 PM By: Adline Peals Entered By: Adline Peals on 03/03/2022 09:30:10 -------------------------------------------------------------------------------- Vitals Details Patient Name: Date of Service: [name], [name] 03/03/2022 9:15 A M Medical Record Number: [id number] Patient Account Number: [id number] Date of Birth/Sex: Treating RN: [birth year] (72 y.o. [name] Primary Care  Caryl Manas: [name] Other Clinician: Referring Lenni Reckner: Treating Macenzie Burford/Extender: [name] in Treatment: 12 Vital Signs Time Taken: 09:12 Temperature (F): 98 Height (in): 72 Pulse (bpm): 99 Weight (lbs): 208 Respiratory Rate (breaths/min): 16 Body Mass Index (BMI): 28.2 Blood Pressure (mmHg): 155/72 EDWING, FIGLEY ([id number]) [id number] Page 7 of  7 Capillary Blood Glucose (mg/dl): 137 Reference Range: 80 - 120 mg / dl Electronic Signature(s) Signed: 03/03/2022 3:22:54 PM By: Adline Peals Entered By: Adline Peals on 03/03/2022 09:13:44

## 2022-03-09 ENCOUNTER — Encounter (HOSPITAL_BASED_OUTPATIENT_CLINIC_OR_DEPARTMENT_OTHER): Payer: Medicare Other | Admitting: General Surgery

## 2022-03-09 DIAGNOSIS — I129 Hypertensive chronic kidney disease with stage 1 through stage 4 chronic kidney disease, or unspecified chronic kidney disease: Secondary | ICD-10-CM | POA: Diagnosis not present

## 2022-03-09 DIAGNOSIS — E1151 Type 2 diabetes mellitus with diabetic peripheral angiopathy without gangrene: Secondary | ICD-10-CM | POA: Diagnosis not present

## 2022-03-09 DIAGNOSIS — E1122 Type 2 diabetes mellitus with diabetic chronic kidney disease: Secondary | ICD-10-CM | POA: Diagnosis not present

## 2022-03-09 DIAGNOSIS — L97122 Non-pressure chronic ulcer of left thigh with fat layer exposed: Secondary | ICD-10-CM | POA: Diagnosis not present

## 2022-03-09 DIAGNOSIS — L97815 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: Secondary | ICD-10-CM | POA: Diagnosis not present

## 2022-03-09 DIAGNOSIS — L97812 Non-pressure chronic ulcer of other part of right lower leg with fat layer exposed: Secondary | ICD-10-CM | POA: Diagnosis not present

## 2022-03-09 DIAGNOSIS — I70239 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: Secondary | ICD-10-CM | POA: Diagnosis not present

## 2022-03-09 DIAGNOSIS — E11622 Type 2 diabetes mellitus with other skin ulcer: Secondary | ICD-10-CM | POA: Diagnosis not present

## 2022-03-09 DIAGNOSIS — E669 Obesity, unspecified: Secondary | ICD-10-CM | POA: Diagnosis not present

## 2022-03-09 DIAGNOSIS — N189 Chronic kidney disease, unspecified: Secondary | ICD-10-CM | POA: Diagnosis not present

## 2022-03-10 NOTE — Progress Notes (Signed)
SASUKE, YAFFE ([id number]) [id number] Page 1 of 10 Visit Report for 03/09/2022 Chief Complaint Document Details Patient Name: Date of Service: Michael FFMA [place], [name] 03/09/2022 9:00 A M Medical Record Number: [id number] Patient Account Number: [id number] Date of Birth/Sex: Treating RN: 16-Jul-1950 (72 y.o. M) Primary Care Provider: [name] Other Clinician: Referring Provider: Treating Provider/Extender: [name] in Treatment: 13 Information Obtained from: Patient Chief Complaint Patient presents to the wound care center today with an open arterial ulcer to the right lower extremity in the setting of diabetes mellitus. he has had this problem for 7 months 12/04/2021: ulcer to right lower anterior tibial surface Electronic Signature(s) Signed: 03/09/2022 9:34:36 AM By: [name] Entered By: Fredirick Maudlin on 03/09/2022 09:34:35 -------------------------------------------------------------------------------- Debridement Details Patient Name: Date of Service: Michael [name], Michael LTER C. 03/09/2022 9:00 A M Medical Record Number: [id number] Patient Account Number: [id number] Date of Birth/Sex: Treating RN: [birth year] (72 y.o. [name] Primary Care Provider: [name] Other Clinician: Referring Provider: Treating Provider/Extender: [name] in Treatment: 13 Debridement Performed for Assessment: Wound #3 Right,Anterior Lower Leg Performed By: Physician Fredirick Maudlin, MD Debridement Type: Debridement Severity of Tissue Pre Debridement: Fat layer exposed Level of Consciousness (Pre-procedure): Awake and Alert Pre-procedure Verification/Time Out Yes - 09:20 Taken: Start Time: 09:23 Pain Control: Lidocaine 4% T opical Solution T Area Debrided (L x W): otal 3.5 (cm) x 4 (cm) = 14 (cm) Tissue and other material debrided: Non-Viable, Michael [name], Other: skin sub Level:  Non-Viable Tissue Debridement Description: Selective/Open Wound Instrument: Curette, Forceps, Scissors Bleeding: Moderate Hemostasis Achieved: Pressure Procedural Pain: 0 Post Procedural Pain: 0 Response to Treatment: Procedure was tolerated well Level of Consciousness (Post- Awake and Alert procedure): Post Debridement Measurements of Total Wound Length: (cm) 3.5 Width: (cm) 4 Depth: (cm) 0.2 Volume: (cm) 2.199 Character of Wound/Ulcer Post Debridement: Improved Michael [name] ([id number]) [id number].pdf Page 2 of 10 Severity of Tissue Post Debridement: Fat layer exposed Post Procedure Diagnosis Same as Pre-procedure Notes Scribed for Dr Celine Ahr by Sharyn Creamer, RN Electronic Signature(s) Signed: 03/09/2022 11:45:20 AM By: [name] Signed: 03/09/2022 5:16:32 PM By: Sharyn Creamer RN, [name] Entered By: Sharyn Creamer on 03/09/2022 09:29:32 -------------------------------------------------------------------------------- HPI Details Patient Name: Date of Service: Michael [name], Michael LTER C. 03/09/2022 9:00 A M Medical Record Number: [id number] Patient Account Number: [id number] Date of Birth/Sex: Treating RN: March 06, 1950 (72 y.o. M) Primary Care Provider: [name] Other Clinician: Referring Provider: Treating Provider/Extender: [name] in Treatment: 13 History of Present Illness Location: right lateral calf closer to the knee Quality: Patient reports experiencing a dull pain to affected area(s). Severity: Patient states wound(s) are getting worse. Duration: Patient has had the wound for > 7 months prior to seeking treatment at the wound center Timing: Pain in wound is constant (hurts all the time) Context: The wound would happen gradually Modifying Factors: Patient wound(s)/ulcer(s) are worsening due to : no resolution and a white material at the base of the wound ssociated Signs and Symptoms: Patient reports  having:no discharge or purulent material A HPI Description: 72 year old gentleman who has been referred to was from his PCP for a chronic ulceration on his right lower extremity which she's had for several weeks. Past medical history significant for diabetes mellitus type 2, hypertension, hyperlipidemia, anxiety, obesity, peripheral vascular disease and chronic kidney disease. He has never been a smoker. Most recent lab work done at his PCPs office  showed a glucose of 217 milligrams per deciliter which is consistent with hyperglycemia. In January 2017 recent hemoglobin A1c values noted to be 8.2%. In April 2017, he has been seen at the vascular office by Dr. Trula Slade and Dr. Kellie Simmering for right leg claudication. He had a right superficial femoral artery stent in September 2012. Recent noninvasive vascular imaging done on 06/24/2015 showed a right ABI of 1.07 with a triphasic waveform and a right TBI of 0.81. The left was noncompressible with a triphasic waveform and a TBI of 1.17. Right lower extremity arterial duplex was unable to identify stent exit but they were elevated velocities at the stent and in the distal thigh with triphasic waveforms. Dr. Stephens Shire opinion was to return to the clinic in 6 months with ABI and right lower extremity arterial duplex studies to be done. He has seen a dermatologist who had done a biopsy and said it was benign and he was given a steroid cream. 10/23/2015 -- Pathology report of the biopsy done in April 2017 shows ulcer with underlying angiodermatitis consistent with stasis dermatitis. 10/30/2015 -- he is going for shoulder surgery in about 10 days' time and was asking about the perioperative care. His blood sugars are running in the high 100s or low 200s. No hemoglobin A1c done recently. 11/20/2015 -- is back up to 2 weeks because of recent left shoulder surgery. 12/04/15; patient returns today with the wound for the most part looking healthy. No evidence of infection  no debridement required. He is using Prisma for 4 weeks, without obvious improvement per her intake nurse. This started as several small open areas that were raised erythematous. He saw dermatology and at some point this was biopsied that just suggested stasis skin physiology. This certainly doesn't look like that 12/11/15; using Hydrafera Blue. Previous biopsy reviewed, no atypia PAS negative. He has a history of stents in the right leg however he does not appear to have primary arterial insufficiency ABI in this clinic was over 0.9. 12/25/2015 -- he has been approved for grafix and we will order for some to be applied next week 01/01/2016 -- he has had his first application of Grafix today 01/08/2016 -- he has had his second application of Grafix today READMISSION 12/04/2021 This is a now 72 year old man who was followed in our clinic about 5 years ago for an ulceration on his right lateral lower leg, just distal to the knee. He has since undergone a number of revascularization procedures that have been complicated by restenosis and wound infections. During the course of this treatment, he developed a small ulcer on his right anterior tibial surface. Despite use of an Unna boot, the wound has continued to expand. He was referred to the wound care center by Dr. Trula Slade for further evaluation and management. On the right anterior tibial surface, there is an irregular wound with heavy slough and eschar accumulation. The periwound skin is intact but he does have 1-2+ pitting edema. There is no purulent drainage or malodor. 10/13; second visit for this man who has an ischemic wound in the setting of type 2 diabetes on the right anterior mid tibia area. He has been revascularized. Using Santyl gent 12/22/2021: The wound is about the same size this week. There is a lot of gray sloughy material on the surface, secondary to silver nitrate used to stop bleeding after what sounds like a fairly aggressive  debridement last week. Michael [name] ([id number]) [id number] Page 3 of 10 12/30/2021: The wound is smaller  this week and a bit cleaner. Edema control is excellent. There is still a bit of slough accumulation. 01/07/2022: The wound continues to contract and is much cleaner this week. Edema control is very good. He has small openings in his upper leg surgical scars, but he is going to be seeing Dr. Trula Slade next Monday and will have him take a look. He has been applying manuka honey to both of the sites. We have been using Santyl and gentamicin on his lower leg wound. 01/14/2022: He saw vascular surgery this week and was told that they were happy to have [place] manage the 2 wounds from his operation. Both of these have a light layer of slough on the surface. The more distal wound is a little bit dry. The anterior tibial wound continues to accumulate a fair amount of slough. Edema control is good. 01/19/2022: Both upper leg wounds are smaller. The more distal is quite dry and cracked when I was examining it. The lower leg wound is more painful and the surrounding tissue is erythematous and indurated. 01/26/2022: The upper leg wounds are healed. The skin at the distal leg wound is quite dry. The lower leg wound is cleaner but still fairly painful. His wrap slid again this week. 02/02/2022: We had good success keeping his wrap intact using a standard Unna boot. The lower leg wound continues to contract and is clean and less painful this week. 12/11; TheraSkin #1 12/18; TheraSkin #2. Wound looks as though it is contracted. Patient states that his pain was better in the leg. Secondary dressing and kerlix Coban 02/24/2022: The wound is smaller since I saw it last. It is less painful and there is good granulation tissue, including over the exposed tendon. 03/09/2022: The wound continues to contract. There is continued fill of the wound cavity with granulation tissue. Electronic  Signature(s) Signed: 03/09/2022 9:35:12 AM By: [name] Entered By: Fredirick Maudlin on 03/09/2022 09:35:12 -------------------------------------------------------------------------------- Physical Exam Details Patient Name: Date of Service: Michael [name], Michael LTER C. 03/09/2022 9:00 A M Medical Record Number: [id number] Patient Account Number: [id number] Date of Birth/Sex: Treating RN: 06/14/1950 (72 y.o. M) Primary Care Provider: [name] Other Clinician: Referring Provider: Treating Provider/Extender: [name] in Treatment: 13 Constitutional Hypertensive, asymptomatic. Slightly tachycardic. . . no acute distress. Respiratory Normal work of breathing on room air. Notes 03/09/2022: The wound continues to contract. There is continued fill of the wound cavity with granulation tissue. Electronic Signature(s) Signed: 03/09/2022 9:35:50 AM By: [name] Entered By: Fredirick Maudlin on 03/09/2022 09:35:50 -------------------------------------------------------------------------------- Physician Orders Details Patient Name: Date of Service: Michael [name], Michael LTER C. 03/09/2022 9:00 A M Medical Record Number: [id number] Patient Account Number: [id number] Date of Birth/Sex: Treating RN: 21-Oct-1950 (72 y.o. [name] Primary Care Provider: [name] Other Clinician: Referring Provider: Treating Provider/Extender: [name] in Treatment: [address] [place] C ([id number]) [id number] Page 4 of 10 Verbal / Phone Orders: No Diagnosis Coding ICD-10 Coding Code Description L97.815 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 I70.239 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 E11.622 Type 2 diabetes mellitus with other skin ulcer I73.9 Peripheral vascular disease,  unspecified L97.122 Non-pressure chronic ulcer of left thigh with fat layer exposed Follow-up Appointments ppointment in 1 week. - Dr. Celine Ahr - room 1 or 4 Return A Anesthetic Wound #3 Right,Anterior Lower Leg (In clinic)  Topical Lidocaine 5% applied to wound bed Cellular or Tissue Based Products Cellular or Tissue Based Product Type: - Theraskin to be continued at next visit Cellular or Tissue Based Product applied to wound bed, secured with steri-strips, cover with Adaptic or Mepitel. (DO NOT REMOVE). Edema Control - Lymphedema / SCD / Other Right Lower Extremity Avoid standing for long periods of time. Wound Treatment Wound #3 - Lower Leg Wound Laterality: Right, Anterior Cleanser: Soap and Water 1 x Per Week/30 Days Discharge Instructions: May shower and wash wound with dial antibacterial soap and water prior to dressing change. Cleanser: Wound Cleanser 1 x Per Week/30 Days Discharge Instructions: Cleanse the wound with wound cleanser prior to applying a clean dressing using gauze sponges, not tissue or cotton balls. Peri-Wound Care: Sween Lotion (Moisturizing lotion) 1 x Per Week/30 Days Discharge Instructions: Apply moisturizing lotion as directed Prim Dressing: ADAPTIC TOUCH 3x4.25 (in/in) 1 x Per Week/30 Days ary Discharge Instructions: Apply to wound bed as instructed Prim Dressing: Endoform 2x2 in 1 x Per Week/30 Days ary Discharge Instructions: Moisten with saline Secondary Dressing: ABD Pad, 8x10 1 x Per Week/30 Days Discharge Instructions: Apply over primary dressing as directed. Secondary Dressing: Woven Gauze Sponge, Non-Sterile 4x4 in 1 x Per Week/30 Days Discharge Instructions: Apply over primary dressing as directed. Secured With: [employer] Size 5, 10 (yds) 1 x Per Week/30 Days Compression Wrap: Unnaboot w/Calamine, 4x10 (in/yd) 1 x Per Week/30 Days Discharge Instructions: Apply Unnaboot to top of dressing and around ankles to prevent dressing from  sliding Compression Wrap: Kerlix Roll 4.5x3.1 (in/yd) 1 x Per Week/30 Days Discharge Instructions: Apply Kerlix and Coban compression as directed. Compression Wrap: Coban Self-Adherent Wrap 4x5 (in/yd) 1 x Per Week/30 Days Discharge Instructions: Apply over Kerlix as directed. Patient Medications llergies: Actos, Demerol, glimepiride, nabumetone, oxycodone HCl, penicillin A Notifications Medication Indication Start End prior to debridement 03/09/2022 lidocaine DOSE topical 4 % cream - cream topical once daily Electronic Signature(s) Signed: 03/09/2022 11:45:20 AM By: [name] [name] ([id number]) [id number] Page 5 of 10 Entered By: Fredirick Maudlin on 03/09/2022 09:36:05 -------------------------------------------------------------------------------- Problem List Details Patient Name: Date of Service: Michael FFMA [place], [name] 03/09/2022 9:00 A M Medical Record Number: [id number] Patient Account Number: [id number] Date of Birth/Sex: Treating RN: 1950/04/29 (72 y.o. M) Primary Care Provider: [name] Other Clinician: Referring Provider: Treating Provider/Extender: [name] in Treatment: 13 Active Problems ICD-10 Encounter Code Description Active Date MDM Diagnosis L97.815 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 12/04/2021 No Yes involvement without evidence of necrosis I70.239 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 12/04/2021 No Yes E11.622 Type 2 diabetes mellitus with other skin ulcer 12/04/2021 No Yes I73.9 Peripheral vascular disease, unspecified 12/04/2021 No Yes L97.122 Non-pressure chronic ulcer of left thigh with fat layer exposed 01/14/2022 No Yes Inactive Problems Resolved Problems Electronic Signature(s) Signed: 03/09/2022 9:34:19 AM By: [name] Entered By: Fredirick Maudlin on 03/09/2022  09:34:19 -------------------------------------------------------------------------------- Progress Note Details Patient Name: Date of Service: Michael [name], Michael LTER C. 03/09/2022 9:00 A M Medical Record Number: [id number] Patient Account Number: [id number] Date of Birth/Sex: Treating RN: [birth year] (72 y.o. M) Primary Care Provider: [name] Other Clinician: Referring Provider: Treating Provider/Extender: [name] in Treatment: [address] Subjective Chief Complaint Michael [name] ([id number]) [id number] Page 6 of 10 Information obtained from Patient Patient presents to the wound care center today  with an open arterial ulcer to the right lower extremity in the setting of diabetes mellitus. he has had this problem for 7 months 12/04/2021: ulcer to right lower anterior tibial surface History of Present Illness (HPI) The following HPI elements were documented for the patient's wound: Location: right lateral calf closer to the knee Quality: Patient reports experiencing a dull pain to affected area(s). Severity: Patient states wound(s) are getting worse. Duration: Patient has had the wound for > 7 months prior to seeking treatment at the wound center Timing: Pain in wound is constant (hurts all the time) Context: The wound would happen gradually Modifying Factors: Patient wound(s)/ulcer(s) are worsening due to : no resolution and a white material at the base of the wound Associated Signs and Symptoms: Patient reports having:no discharge or purulent material 72 year old gentleman who has been referred to was from his PCP for a chronic ulceration on his right lower extremity which she's had for several weeks. Past medical history significant for diabetes mellitus type 2, hypertension, hyperlipidemia, anxiety, obesity, peripheral vascular disease and chronic kidney disease. He has never been a smoker. Most recent lab work done at his PCPs office  showed a glucose of 217 milligrams per deciliter which is consistent with hyperglycemia. In January 2017 recent hemoglobin A1c values noted to be 8.2%. In April 2017, he has been seen at the vascular office by Dr. Trula Slade and Dr. Kellie Simmering for right leg claudication. He had a right superficial femoral artery stent in September 2012. Recent noninvasive vascular imaging done on 06/24/2015 showed a right ABI of 1.07 with a triphasic waveform and a right TBI of 0.81. The left was noncompressible with a triphasic waveform and a TBI of 1.17. Right lower extremity arterial duplex was unable to identify stent exit but they were elevated velocities at the stent and in the distal thigh with triphasic waveforms. Dr. Stephens Shire opinion was to return to the clinic in 6 months with ABI and right lower extremity arterial duplex studies to be done. He has seen a dermatologist who had done a biopsy and said it was benign and he was given a steroid cream. 10/23/2015 -- Pathology report of the biopsy done in April 2017 shows ulcer with underlying angiodermatitis consistent with stasis dermatitis. 10/30/2015 -- he is going for shoulder surgery in about 10 days' time and was asking about the perioperative care. His blood sugars are running in the high 100s or low 200s. No hemoglobin A1c done recently. 11/20/2015 -- is back up to 2 weeks because of recent left shoulder surgery. 12/04/15; patient returns today with the wound for the most part looking healthy. No evidence of infection no debridement required. He is using Prisma for 4 weeks, without obvious improvement per her intake nurse. This started as several small open areas that were raised erythematous. He saw dermatology and at some point this was biopsied that just suggested stasis skin physiology. This certainly doesn't look like that 12/11/15; using Hydrafera Blue. Previous biopsy reviewed, no atypia PAS negative. He has a history of stents in the right leg however he  does not appear to have primary arterial insufficiency ABI in this clinic was over 0.9. 12/25/2015 -- he has been approved for grafix and we will order for some to be applied next week 01/01/2016 -- he has had his first application of Grafix today 01/08/2016 -- he has had his second application of Grafix today READMISSION 12/04/2021 This is a now 72 year old man who was followed in our clinic about 5 years  ago for an ulceration on his right lateral lower leg, just distal to the knee. He has since undergone a number of revascularization procedures that have been complicated by restenosis and wound infections. During the course of this treatment, he developed a small ulcer on his right anterior tibial surface. Despite use of an Unna boot, the wound has continued to expand. He was referred to the wound care center by Dr. Trula Slade for further evaluation and management. On the right anterior tibial surface, there is an irregular wound with heavy slough and eschar accumulation. The periwound skin is intact but he does have 1-2+ pitting edema. There is no purulent drainage or malodor. 10/13; second visit for this man who has an ischemic wound in the setting of type 2 diabetes on the right anterior mid tibia area. He has been revascularized. Using Santyl gent 12/22/2021: The wound is about the same size this week. There is a lot of gray sloughy material on the surface, secondary to silver nitrate used to stop bleeding after what sounds like a fairly aggressive debridement last week. 12/30/2021: The wound is smaller this week and a bit cleaner. Edema control is excellent. There is still a bit of slough accumulation. 01/07/2022: The wound continues to contract and is much cleaner this week. Edema control is very good. He has small openings in his upper leg surgical scars, but he is going to be seeing Dr. Trula Slade next Monday and will have him take a look. He has been applying manuka honey to both of the sites.  We have been using Santyl and gentamicin on his lower leg wound. 01/14/2022: He saw vascular surgery this week and was told that they were happy to have [place] manage the 2 wounds from his operation. Both of these have a light layer of slough on the surface. The more distal wound is a little bit dry. The anterior tibial wound continues to accumulate a fair amount of slough. Edema control is good. 01/19/2022: Both upper leg wounds are smaller. The more distal is quite dry and cracked when I was examining it. The lower leg wound is more painful and the surrounding tissue is erythematous and indurated. 01/26/2022: The upper leg wounds are healed. The skin at the distal leg wound is quite dry. The lower leg wound is cleaner but still fairly painful. His wrap slid again this week. 02/02/2022: We had good success keeping his wrap intact using a standard Unna boot. The lower leg wound continues to contract and is clean and less painful this week. 12/11; TheraSkin #1 12/18; TheraSkin #2. Wound looks as though it is contracted. Patient states that his pain was better in the leg. Secondary dressing and kerlix Coban 02/24/2022: The wound is smaller since I saw it last. It is less painful and there is good granulation tissue, including over the exposed tendon. 03/09/2022: The wound continues to contract. There is continued fill of the wound cavity with granulation tissue. Patient History Information obtained from Patient. Family History Michael [name] ([id number]) [id number] Page 7 of 10 Cancer - Siblings, Heart Disease - Father, Hypertension - Father,Mother, No family history of Hereditary Spherocytosis, Kidney Disease, Seizures, Stroke, Thyroid Problems, Tuberculosis. Social History Former smoker - smokeless tobacco, Marital Status - Married, Alcohol Use - Rarely - WINE, Drug Use - No History, Caffeine Use - Daily - COFFEE. Medical History Ear/Nose/Mouth/Throat Patient has  history of Chronic sinus problems/congestion - seasonal allergies Cardiovascular Patient has history of Hypertension, Peripheral Arterial Disease - STENTS, Peripheral Venous  Disease Endocrine Patient has history of Type II Diabetes - last A1c- 8.2 Hospitalization/Surgery History - left shoulder surgery. Medical A Surgical History Notes nd Constitutional Symptoms (General Health) obesity , h/o (R) leg claudication (right fem stent September 2012) Cardiovascular hyperlipidemia Endocrine pt. on diet intentionally losing 20 pounds since December 2016 in an effort to improve A1C levels Integumentary (Skin) psoriatic arthritis Musculoskeletal bilat knee pain identified as an ortho issue psoriatic arthritis Oncologic Prostate cancer 2018 Psychiatric anxiety Objective Constitutional Hypertensive, asymptomatic. Slightly tachycardic. no acute distress. Vitals Time Taken: 9:00 AM, Height: 72 in, Weight: 208 lbs, BMI: 28.2, Temperature: 98.6 F, Pulse: 105 bpm, Respiratory Rate: 18 breaths/min, Blood Pressure: 158/75 mmHg, Capillary Blood Glucose: 87 mg/dl. Respiratory Normal work of breathing on room air. General Notes: 03/09/2022: The wound continues to contract. There is continued fill of the wound cavity with granulation tissue. Integumentary (Hair, Skin) Wound #3 status is Open. Original cause of wound was Blister. The date acquired was: 09/30/2021. The wound has been in treatment 13 weeks. The wound is located on the Right,Anterior Lower Leg. The wound measures 3.5cm length x 4cm width x 0.2cm depth; 10.996cm^2 area and 2.199cm^3 volume. There is Fat Layer (Subcutaneous Tissue) exposed. There is no tunneling or undermining noted. There is a medium amount of serosanguineous drainage noted. The wound margin is distinct with the outline attached to the wound base. There is large (67-100%) red, pink granulation within the wound bed. There is a small (1-33%) amount of necrotic tissue within the  wound bed including Adherent Slough. The periwound skin appearance had no abnormalities noted for texture. The periwound skin appearance had no abnormalities noted for moisture. The periwound skin appearance had no abnormalities noted for color. Periwound temperature was noted as No Abnormality. The periwound has tenderness on palpation. 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 There was a Selective/Open Wound Non-Viable Tissue Debridement with a total area of 14 sq cm performed by Fredirick Maudlin, MD. With the following instrument(s): Curette, Forceps, and Scissors to remove Non-Viable tissue/material. Material removed includes [facility] and Other: skin sub after achieving pain control using Lidocaine 4% Topical Solution. No specimens were taken. A time out was conducted at 09:20, prior to the start of the procedure. A Moderate amount of bleeding was controlled with Pressure. The procedure was tolerated well with a pain level of 0 throughout and a pain level of 0 following the procedure. Post Debridement Measurements: 3.5cm length x 4cm width x 0.2cm depth; 2.199cm^3 volume. Character of Wound/Ulcer Post Debridement is improved. Severity of Tissue Post Debridement is: Fat layer exposed. Post procedure Diagnosis Wound #3: Same as Pre-Procedure General Notes: Scribed for Dr Celine Ahr  by Sharyn Creamer, RN. Plan Follow-up Appointments: Return Appointment in 1 week. - Dr. Celine Ahr - room 1 or 4 Anesthetic: Wound #3 Right,Anterior Lower Leg: (In clinic) Topical Lidocaine 5% applied to wound bed Cellular or Tissue Based Products: Cellular or Tissue Based Product Type: - Theraskin to be continued at next visit Cellular  or Tissue Based Product applied to wound bed, secured with steri-strips, cover with Adaptic or Mepitel. (DO NOT REMOVE). Edema Control - Lymphedema / SCD / Other: Avoid standing for long periods of time. The following medication(s) was prescribed: lidocaine topical 4 % cream cream topical once daily for prior to debridement was prescribed at facility WOUND #3: - Lower Leg Wound Laterality: Right, Anterior Cleanser: Soap and Water 1 x Per Week/30 Days Discharge Instructions: May shower and wash wound with dial antibacterial soap and water prior to dressing change. Cleanser: Wound Cleanser 1 x Per Week/30 Days Discharge Instructions: Cleanse the wound with wound cleanser prior to applying a clean dressing using gauze sponges, not tissue or cotton balls. Peri-Wound Care: Sween Lotion (Moisturizing lotion) 1 x Per Week/30 Days Discharge Instructions: Apply moisturizing lotion as directed Prim Dressing: ADAPTIC TOUCH 3x4.25 (in/in) 1 x Per Week/30 Days ary Discharge Instructions: Apply to wound bed as instructed Prim Dressing: Endoform 2x2 in 1 x Per Week/30 Days ary Discharge Instructions: Moisten with saline Secondary Dressing: ABD Pad, 8x10 1 x Per Week/30 Days Discharge Instructions: Apply over primary dressing as directed. Secondary Dressing: Woven Gauze Sponge, Non-Sterile 4x4 in 1 x Per Week/30 Days Discharge Instructions: Apply over primary dressing as directed. Secured With: [employer] Size 5, 10 (yds) 1 x Per Week/30 Days Com pression Wrap: Unnaboot w/Calamine, 4x10 (in/yd) 1 x Per Week/30 Days Discharge Instructions: Apply Unnaboot to top of dressing  and around ankles to prevent dressing from sliding Com pression Wrap: Kerlix Roll 4.5x3.1 (in/yd) 1 x Per Week/30 Days Discharge Instructions: Apply Kerlix and Coban compression as directed. Com pression Wrap: Coban Self-Adherent Wrap 4x5 (in/yd) 1 x Per Week/30 Days Discharge Instructions: Apply over Kerlix as directed. 03/09/2022: The wound continues to contract. There is continued fill of the wound cavity with granulation tissue. I used a curette to debride slough in the remnants of the previous TheraSkin application. Due to the first of the year insurance turnover, we did not have a TheraSkin application form today. We will apply endoform but we will order a TheraSkin for next week. Continue Kerlix and Coban wrap. He has been elevating his leg religiously and I think this was contributing to his wound healing. Follow-up in 1 week's time. Electronic Signature(s) Signed: 03/09/2022 9:37:19 AM By: [name] Entered By: Fredirick Maudlin on 03/09/2022 09:37:19 -------------------------------------------------------------------------------- HxROS Details Patient Name: Date of Service: Michael [name], Michael LTER C. 03/09/2022 9:00 A M Medical Record Number: [id number] Patient Account Number: [id number] Date of Birth/Sex: Treating RN: Jan 24, 1951 (72 y.o. M) Primary Care Provider: [name] Other Clinician: Referring Provider: Treating Provider/Extender: [name] in Treatment: [address], [place] ([id number]) [id number] Page 9 of 10 Information Obtained From Patient Constitutional Symptoms (General Health) Medical History: Past Medical History Notes: obesity , h/o (R) leg claudication (right fem stent September 2012) Ear/Nose/Mouth/Throat Medical History: Positive for: Chronic sinus problems/congestion - seasonal allergies Cardiovascular Medical History: Positive for: Hypertension; Peripheral Arterial Disease - STENTS;  Peripheral Venous Disease Past Medical History Notes: hyperlipidemia Endocrine Medical History: Positive for: Type II Diabetes - last A1c- 8.2 Past Medical History Notes: pt. on diet intentionally losing 20 pounds since December 2016 in an effort to improve A1C levels Time with diabetes: 9 YRS Treated with: Insulin Blood sugar tested every day: Yes Tested : 7-8 TIMES Integumentary (Skin) Medical History: Past Medical History Notes: psoriatic arthritis Musculoskeletal Medical History: Past Medical History Notes: bilat knee pain identified as an ortho issue  psoriatic arthritis Oncologic Medical History: Past Medical History Notes: Prostate cancer 2018 Psychiatric Medical History: Past Medical History Notes: anxiety HBO Extended History Items Ear/Nose/Mouth/Throat: Chronic sinus problems/congestion Immunizations Pneumococcal Vaccine: Received Pneumococcal Vaccination: Yes Received Pneumococcal Vaccination On or After 60th Birthday: Yes Implantable Devices None Hospitalization / Surgery History Type of Hospitalization/Surgery left shoulder surgery Family and Social History Cancer: Yes - Siblings; Heart Disease: Yes - Father; Hereditary Spherocytosis: No; Hypertension: Yes - Father,Mother; Kidney Disease: No; Seizures: No; Stroke: No; Thyroid Problems: No; Tuberculosis: No; Former smoker - smokeless tobacco; Marital Status - Married; Alcohol Use: Rarely - WINE; Drug Use: No History; Caffeine Use: Daily - COFFEE; Financial Concerns: No; Food, Clothing or Shelter Needs: No; Support System Lacking: No; Transportation Concerns: No Michael [name] ([id number]) [id number] Page 10 of 10 Electronic Signature(s) Signed: 03/09/2022 11:45:20 AM By: [name] Entered By: Fredirick Maudlin on 03/09/2022 09:35:20 -------------------------------------------------------------------------------- SuperBill Details Patient Name: Date of  Service: Michael [name], Michael LTER C. 03/09/2022 Medical Record Number: [id number] Patient Account Number: [id number] Date of Birth/Sex: Treating RN: August 13, 1950 (72 y.o. 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 E11.622 Type 2 diabetes mellitus with other skin ulcer I73.9 Peripheral vascular disease, unspecified L97.122 Non-pressure chronic ulcer of left thigh with fat layer exposed Facility Procedures : CPT4 Code Description: [id number] - DEBRIDE WOUND 1ST 20 SQ CM OR < ICD-10 Diagnosis Description L97.815 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 involvemen Modifier: t without evidence Quantity: 1 of necrosis Physician Procedures : CPT4 Code Description Modifier [id number] - WC PHYS LEVEL 3 - EST PT 25 ICD-10 Diagnosis Description L97.815 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

## 2022-03-10 NOTE — Progress Notes (Signed)
KOBE, OFALLON ([id number]) [id number] Page 1 of 7 Visit Report for 03/09/2022 Arrival Information Details Patient Name: Date of Service: HO FFMA [name], [name] 03/09/2022 9:00 A M Medical Record Number: [id number] Patient Account Number: [id number] Date of Birth/Sex: Treating RN: 1950/03/24 (72 y.o. Michael [name] Primary Care Michael [name]: Michael [name] Other Clinician: Referring Michael [name]: Treating Vermelle Cammarata/Extender: Michael [name] in Treatment: 15 Visit Information History Since Last Visit Added or deleted any medications: No Patient Arrived: Ambulatory Any new allergies or adverse reactions: No Arrival Time: 08:54 Had a fall or experienced change in No Accompanied By: self activities of daily living that may affect Transfer Assistance: None risk of falls: Patient Identification Verified: Yes Signs or symptoms of abuse/neglect since last visito No Secondary Verification Process Completed: Yes Hospitalized since last visit: No Patient Requires Transmission-Based Precautions: No Implantable device outside of the clinic excluding No Patient Has Alerts: Yes cellular tissue based products placed in the center Patient Alerts: Patient on Blood Thinner since last visit: plavix Has Dressing in Place as Prescribed: Yes Has Compression in Place as Prescribed: Yes Pain Present Now: No Electronic Signature(s) Signed: 03/09/2022 5:16:32 PM By: Sharyn Creamer RN, [name] Entered By: Sharyn Creamer on 03/09/2022 09:00:18 -------------------------------------------------------------------------------- Encounter Discharge Information Details Patient Name: Date of Service: [name], [name] 03/09/2022 9:00 A M Medical Record Number: [id number] Patient Account Number: [id number] Date of Birth/Sex: Treating RN: 12/19/1950 (72 y.o. Michael [name] Primary Care Michael [name]: Michael [name] Other Clinician: Referring Dannica Bickham: Treating Michael [name]/Extender:  Michael [name] in Treatment: 13 Encounter Discharge Information Items Post Procedure Vitals Discharge Condition: Stable Temperature (F): 98.6 Ambulatory Status: Ambulatory Pulse (bpm): 105 Discharge Destination: Home Respiratory Rate (breaths/min): 18 Transportation: Private Auto Blood Pressure (mmHg): 149/86 Accompanied By: wife Schedule Follow-up Appointment: Yes Clinical Summary of Care: Patient Declined Electronic Signature(s) Signed: 03/09/2022 5:16:32 PM By: Sharyn Creamer RN, [name] Entered By: Sharyn Creamer on 03/09/2022 09:49:42 [name] ([id number]) [id number] Page 2 of 7 -------------------------------------------------------------------------------- Lower Extremity Assessment Details Patient Name: Date of Service: [name], [name] 03/09/2022 9:00 A M Medical Record Number: [id number] Patient Account Number: [id number] Date of Birth/Sex: Treating RN: Oct 13, 1950 (72 y.o. Michael [name] Primary Care Angelia [name]: Michael [name] Other Clinician: Referring Michael [name]: Treating Michael [name]/Extender: Michael [name] in Treatment: 13 Edema Assessment Assessed: Shirlyn Goltz: No] Patrice Paradise: No] Edema: [Left: Ye] [Right: s] Calf Left: Right: Point of Measurement: 35 cm From Medial Instep 33 cm Ankle Left: Right: Point of Measurement: 12 cm From Medial Instep 21 cm Vascular Assessment Pulses: Dorsalis Pedis Palpable: [Right:Yes] Electronic Signature(s) Signed: 03/09/2022 5:16:32 PM By: Sharyn Creamer RN, [name] Entered By: Sharyn Creamer on 03/09/2022 09:18:04 -------------------------------------------------------------------------------- Multi Wound Chart Details Patient Name: Date of Service: [name], [name] 03/09/2022 9:00 A M Medical Record Number: [id number] Patient Account Number: [id number] Date of Birth/Sex: Treating RN: Sep 26, 1950 (72 y.o. M) Primary Care Michael [name]: Michael [name] Other  Clinician: Referring Jahna Liebert: Treating Michael [name]/Extender: Michael [name] in Treatment: 13 Vital Signs Height(in): 72 Capillary Blood Glucose(mg/dl): 87 Weight(lbs): 208 Pulse(bpm): 105 Body Mass Index(BMI): 28.2 Blood Pressure(mmHg): 158/75 Temperature(F): 98.6 Respiratory Rate(breaths/min): 18 [3:Photos: No Photos Right, Anterior Lower Leg Wound Location: Blister Wounding Event: Diabetic Wound/Ulcer of the Lower Primary Etiology: Extremity Chronic sinus problems/congestion, Comorbid History: Hypertension, Peripheral Arterial Disease, Peripheral Venous  Disease, Type II Diabetes] [N/A:N/A N/A N/A N/A N/A] Michael, [name] ([id number]) [3:09/30/2021 Date Acquired: 13 Weeks of Treatment: Open Wound  Status: No Wound Recurrence: 3.5x4x0.2 Measurements L x W x D (cm) 10.996 A (cm) : rea 2.199 Volume (cm) : 37.40% % Reduction in A rea: 58.30% % Reduction in Volume: Grade 2 Classification:  Medium Exudate A mount: Serosanguineous Exudate Type: red, brown Exudate Color: Distinct, outline attached Wound Margin: Large (67-100%) Granulation A mount: Red, Pink Granulation Quality: Small (1-33%) Necrotic A mount: Fat Layer (Subcutaneous Tissue):  Yes N/A Exposed Structures: Fascia: No Tendon: No Muscle: No Joint: No Bone: No Small (1-33%) Epithelialization: Debridement - Selective/Open Wound N/A Debridement: Pre-procedure Verification/Time Out 09:20 Taken: Lidocaine 4% Topical Solution Pain  Control: Other, Slough Tissue Debrided: Non-Viable Tissue Level: 14 Debridement A (sq cm): rea Curette, Forceps, Scissors Instrument: Moderate Bleeding: Pressure Hemostasis A chieved: 0 Procedural Pain: 0 Post Procedural Pain: Procedure was tolerated  well Debridement Treatment Response: 3.5x4x0.2 Post Debridement Measurements L x W x D (cm) 2.199 Post Debridement Volume: (cm) No Abnormalities Noted Periwound Skin Texture: Maceration: Yes Periwound Skin Moisture: Atrophie Blanche: No Periwound  Skin  Color: Cyanosis: No Erythema: No No Abnormality Temperature: Yes Tenderness on Palpation: Debridement Procedures Performed:] [N/A:N/A N/A N/A N/A N/A N/A N/A N/A N/A N/A N/A N/A N/A N/A N/A N/A N/A N/A N/A N/A N/A N/A N/A N/A N/A N/A N/A N/A N/A N/A N/A  N/A N/A N/A N/A N/A N/A] Treatment Notes Electronic Signature(s) Signed: 03/09/2022 9:34:27 AM By: [name] Entered By: Fredirick Maudlin on 03/09/2022 09:34:27 -------------------------------------------------------------------------------- Multi-Disciplinary Care Plan Details Patient Name: Date of Service: [name], [name] 03/09/2022 9:00 A M Medical Record Number: [id number] Patient Account Number: [id number] Date of Birth/Sex: Treating RN: 27-Apr-1950 (72 y.o. Michael [name] Primary Care Zykeria Laguardia: Michael [name] Other Clinician: Referring Chamika Cunanan: Treating Massiah Minjares/Extender: Michael [name] in Treatment: 13 Active Inactive Nutrition Nursing Diagnoses: Potential for alteratiion in Nutrition/Potential for imbalanced nutrition Michael, [name] ([id number]) [phone].pdf Page 4 of 7 Goals: Patient/caregiver agrees to and verbalizes understanding of need to use nutritional supplements and/or vitamins as prescribed Date Initiated: 12/04/2021 Target Resolution Date: 04/01/2022 Goal Status: Active Patient/caregiver verbalizes understanding of need to maintain therapeutic glucose control per primary care physician Date Initiated: 12/04/2021 Target Resolution Date: 04/01/2022 Goal Status: Active Interventions: Assess patient nutrition upon admission and as needed per policy Provide education on elevated blood sugars and impact on wound healing Provide education on nutrition Treatment Activities: Education provided on Nutrition : 01/26/2022 Notes: Wound/Skin Impairment Nursing Diagnoses: Impaired tissue integrity Knowledge deficit related to ulceration/compromised  skin integrity Goals: Patient/caregiver will verbalize understanding of skin care regimen Date Initiated: 12/04/2021 Date Inactivated: 02/02/2022 Target Resolution Date: 01/28/2022 Goal Status: Met Ulcer/skin breakdown will have a volume reduction of 30% by week 4 Date Initiated: 12/04/2021 Date Inactivated: 01/07/2022 Target Resolution Date: 12/26/2021 Goal Status: Met Ulcer/skin breakdown will have a volume reduction of 50% by week 8 Date Initiated: 01/07/2022 Target Resolution Date: 04/01/2022 Goal Status: Active Interventions: Assess ulceration(s) every visit Provide education on ulcer and skin care Notes: Electronic Signature(s) Signed: 03/09/2022 5:16:32 PM By: Sharyn Creamer RN, [name] Entered By: Sharyn Creamer on 03/09/2022 09:47:05 -------------------------------------------------------------------------------- Pain Assessment Details Patient Name: Date of Service: [name], [name] 03/09/2022 9:00 A M Medical Record Number: [id number] Patient Account Number: [id number] Date of Birth/Sex: Treating RN: [birth year] (72 y.o. Michael [name] Primary Care [place] Agent: Michael [name] Other Clinician: Referring Tinzley Dalia: Treating Dellas Guard/Extender: Michael [name] in Treatment: 13 Active Problems Location of Pain Severity and Description of Pain Patient Has  Paino No Site Locations Michael, [name] ([id number]) [id number] Page 5 of 7 Pain Management and Medication Current Pain Management: Electronic Signature(s) Signed: 03/09/2022 5:16:32 PM By: Sharyn Creamer RN, [name] Entered By: Sharyn Creamer on 03/09/2022 09:01:13 -------------------------------------------------------------------------------- Patient/Caregiver Education Details Patient Name: Date of Service: [name], [place]. 1/8/2024andnbsp9:00 A M Medical Record Number: [id number] Patient Account Number: [id number] Date of Birth/Gender: Treating RN: Mar 16, 1950 (72 y.o. Michael [name] Primary Care Physician: Michael [name] Other Clinician: Referring Physician: Treating Physician/Extender: Michael [name] in Treatment: 13 Education Assessment Education Provided To: Patient Education Topics Provided Wound/Skin Impairment: Methods: Explain/Verbal Responses: State content correctly [employer]) Signed: 03/09/2022 5:16:32 PM By: Sharyn Creamer RN, [name] Entered By: Sharyn Creamer on 03/09/2022 09:47:32 -------------------------------------------------------------------------------- Wound Assessment Details Patient Name: Date of Service: [name], [name] 03/09/2022 9:00 A M Medical Record Number: [id number] Patient Account Number: [id number] Date of Birth/Sex: Treating RN: 28-May-1950 (72 y.o. Michael [name] Primary Care Julyssa Kyer: Michael [name] Other Clinician: Referring Kolden Dupee: Treating Lisia [place]/Extender: Quintin, Hjort, [place] ([id number]) [id number] Page 6 of 7 Weeks in Treatment: 13 Wound Status Wound Number: 3 Primary Diabetic Wound/Ulcer of the Lower Extremity Etiology: Wound Location: Right, Anterior Lower Leg Wound Open Wounding Event: Blister Status: Date Acquired: 09/30/2021 Comorbid Chronic sinus problems/congestion, Hypertension, Peripheral Weeks Of Treatment: 13 History: Arterial Disease, Peripheral Venous Disease, Type II Diabetes Clustered Wound: No Wound Measurements Length: (cm) 3.5 Width: (cm) 4 Depth: (cm) 0.2 Area: (cm) 10.996 Volume: (cm) 2.199 % Reduction in Area: 37.4% % Reduction in Volume: 58.3% Epithelialization: Small (1-33%) Tunneling: No Undermining: No Wound Description Classification: Grade 2 Wound Margin: Distinct, outline attached Exudate Amount: Medium Exudate Type: Serosanguineous Exudate Color: red, brown Foul Odor After Cleansing: No Slough/Fibrino Yes Wound Bed Granulation Amount: Large (67-100%) Exposed  Structure Granulation Quality: Red, Pink Fascia Exposed: No Necrotic Amount: Small (1-33%) Fat Layer (Subcutaneous Tissue) Exposed: Yes Necrotic Quality: Adherent Slough Tendon Exposed: No Muscle Exposed: No Joint Exposed: No Bone Exposed: No Periwound Skin Texture Texture Color No Abnormalities Noted: Yes No Abnormalities Noted: Yes Moisture Temperature / Pain No Abnormalities Noted: Yes Temperature: No Abnormality Tenderness on Palpation: Yes Treatment Notes Wound #3 (Lower Leg) Wound Laterality: Right, Anterior Cleanser Soap and Water Discharge Instruction: May shower and wash wound with dial antibacterial soap and water prior to dressing change. Wound Cleanser Discharge Instruction: Cleanse the wound with wound cleanser prior to applying a clean dressing using gauze sponges, not tissue or cotton balls. Peri-Wound Care Sween Lotion (Moisturizing lotion) Discharge Instruction: Apply moisturizing lotion as directed Topical Primary Dressing ADAPTIC TOUCH 3x4.25 (in/in) Discharge Instruction: Apply to wound bed as instructed Endoform 2x2 in Discharge Instruction: Moisten with saline Secondary Dressing ABD Pad, 8x10 Discharge Instruction: Apply over primary dressing as directed. Woven Gauze Sponge, Non-Sterile 4x4 in Discharge Instruction: Apply over primary dressing as directed. Secured With [employer] Size 5, 10 (yds) Compression Wrap Michael, [name] ([id number]) [id number] Page 7 of 7 Unnaboot w/Calamine, 4x10 (in/yd) Discharge Instruction: Apply Unnaboot to top of dressing and around ankles to prevent dressing from sliding Kerlix Roll 4.5x3.1 (in/yd) Discharge Instruction: Apply Kerlix and Coban compression as directed. Coban Self-Adherent Wrap 4x5 (in/yd) Discharge Instruction: Apply over Kerlix as directed. Compression Stockings Add-Ons Electronic Signature(s) Signed: 03/09/2022 5:16:32 PM By: Sharyn Creamer RN, [name] Entered By:  Sharyn Creamer on 03/09/2022 09:19:29 -------------------------------------------------------------------------------- [place] Details Patient Name: Date of Service: [name], [place] C. 03/09/2022 9:00 A M Medical  Record Number: [id number] Patient Account Number: [id number] Date of Birth/Sex: Treating RN: Dec 31, 1950 (72 y.o. Michael [name] Primary Care Milano Rosevear: Michael [name] Other Clinician: Referring Aidynn Krenn: Treating Gumecindo Hopkin/Extender: Michael [name] in Treatment: 13 Vital Signs Time Taken: 09:00 Temperature (F): 98.6 Height (in): 72 Pulse (bpm): 105 Weight (lbs): 208 Respiratory Rate (breaths/min): 18 Body Mass Index (BMI): 28.2 Blood Pressure (mmHg): 158/75 Capillary Blood Glucose (mg/dl): 87 Reference Range: 80 - 120 mg / dl Electronic Signature(s) Signed: 03/09/2022 5:16:32 PM By: Sharyn Creamer RN, [name] Entered By: Sharyn Creamer on 03/09/2022 09:01:03

## 2022-03-12 DIAGNOSIS — E1165 Type 2 diabetes mellitus with hyperglycemia: Secondary | ICD-10-CM | POA: Diagnosis not present

## 2022-03-16 ENCOUNTER — Encounter (HOSPITAL_BASED_OUTPATIENT_CLINIC_OR_DEPARTMENT_OTHER): Payer: Medicare Other | Admitting: General Surgery

## 2022-03-16 DIAGNOSIS — E11622 Type 2 diabetes mellitus with other skin ulcer: Secondary | ICD-10-CM | POA: Diagnosis not present

## 2022-03-16 DIAGNOSIS — E669 Obesity, unspecified: Secondary | ICD-10-CM | POA: Diagnosis not present

## 2022-03-16 DIAGNOSIS — E1122 Type 2 diabetes mellitus with diabetic chronic kidney disease: Secondary | ICD-10-CM | POA: Diagnosis not present

## 2022-03-16 DIAGNOSIS — L97122 Non-pressure chronic ulcer of left thigh with fat layer exposed: Secondary | ICD-10-CM | POA: Diagnosis not present

## 2022-03-16 DIAGNOSIS — L97812 Non-pressure chronic ulcer of other part of right lower leg with fat layer exposed: Secondary | ICD-10-CM | POA: Diagnosis not present

## 2022-03-16 DIAGNOSIS — E1151 Type 2 diabetes mellitus with diabetic peripheral angiopathy without gangrene: Secondary | ICD-10-CM | POA: Diagnosis not present

## 2022-03-16 DIAGNOSIS — I70239 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: Secondary | ICD-10-CM | POA: Diagnosis not present

## 2022-03-16 DIAGNOSIS — I129 Hypertensive chronic kidney disease with stage 1 through stage 4 chronic kidney disease, or unspecified chronic kidney disease: Secondary | ICD-10-CM | POA: Diagnosis not present

## 2022-03-16 DIAGNOSIS — L97815 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: Secondary | ICD-10-CM | POA: Diagnosis not present

## 2022-03-16 DIAGNOSIS — N189 Chronic kidney disease, unspecified: Secondary | ICD-10-CM | POA: Diagnosis not present

## 2022-03-16 NOTE — Progress Notes (Addendum)
Michael [name], DHINGRA ([id number]) [id number] Page 1 of 11 Visit Report for 03/16/2022 Chief Complaint Document Details Patient Name: Date of Service: HO FFMA [place], [name] 03/16/2022 10:00 A M Medical Record Number: [id number] Patient Account Number: [id number] Date of Birth/Sex: Treating RN: Feb 16, 1951 (72 y.o. M) Primary Care Provider: [name] Other Clinician: Referring Provider: Treating Provider/Extender: [name] in Treatment: 14 Information Obtained from: Patient Chief Complaint Patient presents to the wound care center today with an open arterial ulcer to the right lower extremity in the setting of diabetes mellitus. he has had this problem for 7 months 12/04/2021: ulcer to right lower anterior tibial surface Electronic Signature(s) Signed: 03/16/2022 10:55:50 AM By: [name] Entered By: Fredirick Maudlin on 03/16/2022 10:55:50 -------------------------------------------------------------------------------- Cellular or Tissue Based Product Details Patient Name: Date of Service: HO [place], [name] 03/16/2022 10:00 [place] Record Number: [id number] Patient Account Number: [id number] Date of Birth/Sex: Treating RN: 1951-02-24 (73 y.o. [name] Primary Care Provider: [name] Other Clinician: Referring Provider: Treating Provider/Extender: [name] in Treatment: 14 Cellular or Tissue Based Product Type Wound #3 Right,Anterior Lower Leg Applied to: Performed By: Physician Fredirick Maudlin, MD Cellular or Tissue Based Product Type: Theraskin Level of Consciousness (Pre-procedure): Awake and Alert Pre-procedure Verification/Time Out Yes - 10:21 Taken: Location: trunk / arms / legs Wound Size (sq cm): 6.44 Product Size (sq cm): 39 Waste Size (sq cm): 19 Waste Reason: size of wound Amount of Product Applied (sq cm): 20 Instrument Used: Curette, Forceps,  Scissors Lot #: [id number] Order #: 4 Expiration Date: 05/06/2025 Fenestrated: No Reconstituted: Yes Solution Type: normal saline Solution Amount: 30 Lot #: [id number] Solution Expiration Date: 12/04/2022 Secured: Yes Secured With: Steri-Strips Dressing Applied: Yes Primary Dressing: adaptec Procedural Pain: 0 Post Procedural Pain: 0 Response to Treatment: Procedure was tolerated well Level of Consciousness (Post- Awake and Alert procedure): Post Procedure Diagnosis Same as Pre-procedure [name], Michael ([id number]) [id number] Page 2 of 11 Notes Scribed for Dr. Celine Ahr by Blanche East, RN Electronic Signature(s) Signed: 03/16/2022 12:03:49 PM By: [name] Signed: 03/16/2022 12:17:58 PM By: [name] Entered By: Blanche East on 03/16/2022 12:03:49 -------------------------------------------------------------------------------- Debridement Details Patient Name: Date of Service: HO [place], [place] C. 03/16/2022 10:00 [place] Record Number: [id number] Patient Account Number: [id number] Date of Birth/Sex: Treating RN: January 14, 1951 (72 y.o. M) Primary Care Provider: [name] Other Clinician: Referring Provider: Treating Provider/Extender: [name] in Treatment: 14 Debridement Performed for Assessment: Wound #3 Right,Anterior Lower Leg Performed By: Physician Fredirick Maudlin, MD Debridement Type: Debridement Severity of Tissue Pre Debridement: Fat layer exposed Level of Consciousness (Pre-procedure): Awake and Alert Pre-procedure Verification/Time Out Yes - 10:16 Taken: Start Time: 10:17 Pain Control: Lidocaine 5% topical ointment T Area Debrided (L x W): otal 2.8 (cm) x 2.3 (cm) = 6.44 (cm) Tissue and other material debrided: Non-Viable, Eschar, Slough, Slough Level: Non-Viable Tissue Debridement Description: Selective/Open Wound Instrument: Curette Bleeding: Minimum Hemostasis  Achieved: Pressure Response to Treatment: Procedure was tolerated well Level of Consciousness (Post- Awake and Alert procedure): Post Debridement Measurements of Total Wound Length: (cm) 2.8 Width: (cm) 2.3 Depth: (cm) 0.2 Volume: (cm) 1.012 Character of Wound/Ulcer Post Debridement: Improved Severity of Tissue Post Debridement: Fat layer exposed Post Procedure Diagnosis Same as Pre-procedure Notes Scribed for Dr. Celine Ahr by Blanche East, RN Electronic Signature(s) Signed: 03/16/2022 10:55:12 AM By: [name] Entered By: Fredirick Maudlin  on 03/16/2022 10:55:12 -------------------------------------------------------------------------------- HPI Details Patient Name: Date of Service: HO [place], [name] 03/16/2022 10:00 A M Medical Record Number: [id number] Patient Account Number: [id number] Date of Birth/Sex: Treating RN: 05/07/1950 (72 y.o. M) Primary Care Provider: [name] Other Clinician: Referring Provider: Treating Provider/Extender: [name] in Treatment: 14 History of Present Illness Location: right lateral calf closer to the knee Quality: Patient reports experiencing a dull pain to affected area(s). Severity: Patient states wound(s) are getting worse. Duration: Patient has had the wound for > 7 months prior to seeking treatment at the wound center [name], Michael ([id number]) [id number] Page 3 of 11 Timing: Pain in wound is constant (hurts all the time) Context: The wound would happen gradually Modifying Factors: Patient wound(s)/ulcer(s) are worsening due to : no resolution and a white material at the base of the wound ssociated Signs and Symptoms: Patient reports having:no discharge or purulent material A HPI Description: 72 year old gentleman who has been referred to was from his PCP for a chronic ulceration on his right lower extremity which she's had for several weeks. Past medical  history significant for diabetes mellitus type 2, hypertension, hyperlipidemia, anxiety, obesity, peripheral vascular disease and chronic kidney disease. He has never been a smoker. Most recent lab work done at his PCPs office showed a glucose of 217 milligrams per deciliter which is consistent with hyperglycemia. In January 2017 recent hemoglobin A1c values noted to be 8.2%. In April 2017, he has been seen at the vascular office by Dr. Trula Slade and Dr. Kellie Simmering for right leg claudication. He had a right superficial femoral artery stent in September 2012. Recent noninvasive vascular imaging done on 06/24/2015 showed a right ABI of 1.07 with a triphasic waveform and a right TBI of 0.81. The left was noncompressible with a triphasic waveform and a TBI of 1.17. Right lower extremity arterial duplex was unable to identify stent exit but they were elevated velocities at the stent and in the distal thigh with triphasic waveforms. Dr. Stephens Shire opinion was to return to the clinic in 6 months with ABI and right lower extremity arterial duplex studies to be done. He has seen a dermatologist who had done a biopsy and said it was benign and he was given a steroid cream. 10/23/2015 -- Pathology report of the biopsy done in April 2017 shows ulcer with underlying angiodermatitis consistent with stasis dermatitis. 10/30/2015 -- he is going for shoulder surgery in about 10 days' time and was asking about the perioperative care. His blood sugars are running in the high 100s or low 200s. No hemoglobin A1c done recently. 11/20/2015 -- is back up to 2 weeks because of recent left shoulder surgery. 12/04/15; patient returns today with the wound for the most part looking healthy. No evidence of infection no debridement required. He is using Prisma for 4 weeks, without obvious improvement per her intake nurse. This started as several small open areas that were raised erythematous. He saw dermatology and at some point this was  biopsied that just suggested stasis skin physiology. This certainly doesn't look like that 12/11/15; using Hydrafera Blue. Previous biopsy reviewed, no atypia PAS negative. He has a history of stents in the right leg however he does not appear to have primary arterial insufficiency ABI in this clinic was over 0.9. 12/25/2015 -- he has been approved for grafix and we will order for some to be applied next week 01/01/2016 -- he has had his first application of Grafix today 01/08/2016 --  he has had his second application of Grafix today READMISSION 12/04/2021 This is a now 72 year old man who was followed in our clinic about 5 years ago for an ulceration on his right lateral lower leg, just distal to the knee. He has since undergone a number of revascularization procedures that have been complicated by restenosis and wound infections. During the course of this treatment, he developed a small ulcer on his right anterior tibial surface. Despite use of an Unna boot, the wound has continued to expand. He was referred to the wound care center by Dr. Trula Slade for further evaluation and management. On the right anterior tibial surface, there is an irregular wound with heavy slough and eschar accumulation. The periwound skin is intact but he does have 1-2+ pitting edema. There is no purulent drainage or malodor. 10/13; second visit for this man who has an ischemic wound in the setting of type 2 diabetes on the right anterior mid tibia area. He has been revascularized. Using Santyl gent 12/22/2021: The wound is about the same size this week. There is a lot of gray sloughy material on the surface, secondary to silver nitrate used to stop bleeding after what sounds like a fairly aggressive debridement last week. 12/30/2021: The wound is smaller this week and a bit cleaner. Edema control is excellent. There is still a bit of slough accumulation. 01/07/2022: The wound continues to contract and is much cleaner this  week. Edema control is very good. He has small openings in his upper leg surgical scars, but he is going to be seeing Dr. Trula Slade next Monday and will have him take a look. He has been applying manuka honey to both of the sites. We have been using Santyl and gentamicin on his lower leg wound. 01/14/2022: He saw vascular surgery this week and was told that they were happy to have [place] manage the 2 wounds from his operation. Both of these have a light layer of slough on the surface. The more distal wound is a little bit dry. The anterior tibial wound continues to accumulate a fair amount of slough. Edema control is good. 01/19/2022: Both upper leg wounds are smaller. The more distal is quite dry and cracked when I was examining it. The lower leg wound is more painful and the surrounding tissue is erythematous and indurated. 01/26/2022: The upper leg wounds are healed. The skin at the distal leg wound is quite dry. The lower leg wound is cleaner but still fairly painful. His wrap slid again this week. 02/02/2022: We had good success keeping his wrap intact using a standard Unna boot. The lower leg wound continues to contract and is clean and less painful this week. 12/11; TheraSkin #1 12/18; TheraSkin #2. Wound looks as though it is contracted. Patient states that his pain was better in the leg. Secondary dressing and kerlix Coban 02/24/2022: The wound is smaller since I saw it last. It is less painful and there is good granulation tissue, including over the exposed tendon. 03/09/2022: The wound continues to contract. There is continued fill of the wound cavity with granulation tissue. 03/16/2022: The wound is smaller again today. There is good granulation tissue forming, covering the tendon. Electronic Signature(s) Signed: 03/16/2022 10:56:25 AM By: [name] Entered By: Fredirick Maudlin on 03/16/2022  10:56:25 -------------------------------------------------------------------------------- Physical Exam Details Patient Name: Date of Service: [name], [place] C. 03/16/2022 10:00 A GANON, BRISBIN ([id number]) [id number] Page 4 of 11 Medical Record Number: [id number] Patient Account  Number: [id number] Date of Birth/Sex: Treating RN: 14-Mar-1950 (72 y.o. M) Primary Care Provider: [name] Other Clinician: Referring Provider: Treating Provider/Extender: [name] in Treatment: 14 Constitutional .Tachycardic, asymptomatic. . . no acute distress. Respiratory Normal work of breathing on room air. Notes 03/16/2022: The wound is smaller again today. There is good granulation tissue forming, covering the tendon. Electronic Signature(s) Signed: 03/16/2022 10:57:01 AM By: [name] Entered By: Fredirick Maudlin on 03/16/2022 10:57:01 -------------------------------------------------------------------------------- Physician Orders Details Patient Name: Date of Service: HO [place], [place] C. 03/16/2022 10:00 [place] Record Number: [id number] Patient Account Number: [id number] Date of Birth/Sex: Treating RN: 11-22-1950 (72 y.o. [name] Primary Care Provider: [name] Other Clinician: Referring Provider: Treating Provider/Extender: [name] in Treatment: 14 Verbal / Phone Orders: No Diagnosis Coding ICD-10 Coding Code Description [phone] 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 I70.239 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 E11.622 Type 2 diabetes mellitus with other skin ulcer I73.9 Peripheral vascular disease, unspecified Follow-up Appointments ppointment in 1 week. - Dr. Celine Ahr - room 4 Return A Anesthetic Wound #3 Right,Anterior Lower Leg (In  clinic) Topical Lidocaine 5% applied to wound bed Cellular or Tissue Based Products Cellular or Tissue Based Product Type: - Theraskin 03/16/22 #4 Cellular or Tissue Based Product applied to wound bed, secured with steri-strips, cover with Adaptic or Mepitel. (DO NOT REMOVE). Edema Control - Lymphedema / SCD / Other Right Lower Extremity Avoid standing for long periods of time. Wound Treatment Wound #3 - Lower Leg Wound Laterality: Right, Anterior Cleanser: Soap and Water 1 x Per Week/30 Days Discharge Instructions: May shower and wash wound with dial antibacterial soap and water prior to dressing change. Cleanser: Wound Cleanser 1 x Per Week/30 Days Discharge Instructions: Cleanse the wound with wound cleanser prior to applying a clean dressing using gauze sponges, not tissue or cotton balls. Peri-Wound Care: Sween Lotion (Moisturizing lotion) 1 x Per Week/30 Days Discharge Instructions: Apply moisturizing lotion as directed Prim Dressing: ADAPTIC TOUCH 3x4.25 (in/in) 1 x Per Week/30 Days ary Discharge Instructions: Apply to wound bed as instructed Prim Dressing: Endoform 2x2 in 1 x Per Week/30 Days ary Discharge Instructions: Moisten with saline BLY, BAE ([id number]) [id number] Page 5 of 11 Secondary Dressing: ABD Pad, 8x10 1 x Per Week/30 Days Discharge Instructions: Apply over primary dressing as directed. Secondary Dressing: Woven Gauze Sponge, Non-Sterile 4x4 in 1 x Per Week/30 Days Discharge Instructions: Apply over primary dressing as directed. Secured With: [employer] Size 5, 10 (yds) 1 x Per Week/30 Days Compression Wrap: Unnaboot w/Calamine, 4x10 (in/yd) 1 x Per Week/30 Days Discharge Instructions: Apply Unnaboot to top of dressing and around ankles to prevent dressing from sliding Compression Wrap: Kerlix Roll 4.5x3.1 (in/yd) 1 x Per Week/30 Days Discharge Instructions: Apply Kerlix and Coban compression as directed. Compression Wrap:  Coban Self-Adherent Wrap 4x5 (in/yd) 1 x Per Week/30 Days Discharge Instructions: Apply over Kerlix as directed. Electronic Signature(s) Signed: 03/16/2022 12:17:58 PM By: [name] Entered By: Fredirick Maudlin on 03/16/2022 10:57:52 -------------------------------------------------------------------------------- Problem List Details Patient Name: Date of Service: HO [place], [name] 03/16/2022 10:00 A M Medical Record Number: [id number] Patient Account Number: [id number] Date of Birth/Sex: Treating RN: 05/05/1950 (72 y.o. M) Primary Care Provider: [name] Other Clinician: Referring Provider: Treating Provider/Extender: [name] in Treatment: 14 Active  Problems ICD-10 Encounter Code Description Active Date MDM Diagnosis L97.815 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 12/04/2021 No Yes involvement without evidence of necrosis I70.239 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 12/04/2021 No Yes E11.622 Type 2 diabetes mellitus with other skin ulcer 12/04/2021 No Yes I73.9 Peripheral vascular disease, unspecified 12/04/2021 No Yes Inactive Problems ICD-10 Code Description Active Date Inactive Date L97.122 Non-pressure chronic ulcer of left thigh with fat layer exposed 01/14/2022 01/14/2022 Resolved Problems Electronic Signature(s) Signed: 03/16/2022 10:54:55 AM By: [name] Entered By: Fredirick Maudlin on 03/16/2022 10:54:55 [name] ([id number]) [id number] Page 6 of 11 -------------------------------------------------------------------------------- Progress Note Details Patient Name: Date of Service: [name], [name] 03/16/2022 10:00 A M Medical Record Number: [id number] Patient Account Number: [id number] Date of Birth/Sex: Treating RN: January 15, 1951 (72 y.o. M) Primary Care Provider: [name] Other  Clinician: Referring Provider: Treating Provider/Extender: [name] in Treatment: 14 Subjective Chief Complaint Information obtained from Patient Patient presents to the wound care center today with an open arterial ulcer to the right lower extremity in the setting of diabetes mellitus. he has had this problem for 7 months 12/04/2021: ulcer to right lower anterior tibial surface History of Present Illness (HPI) The following HPI elements were documented for the patient's wound: Location: right lateral calf closer to the knee Quality: Patient reports experiencing a dull pain to affected area(s). Severity: Patient states wound(s) are getting worse. Duration: Patient has had the wound for > 7 months prior to seeking treatment at the wound center Timing: Pain in wound is constant (hurts all the time) Context: The wound would happen gradually Modifying Factors: Patient wound(s)/ulcer(s) are worsening due to : no resolution and a white material at the base of the wound Associated Signs and Symptoms: Patient reports having:no discharge or purulent material 72 year old gentleman who has been referred to was from his PCP for a chronic ulceration on his right lower extremity which she's had for several weeks. Past medical history significant for diabetes mellitus type 2, hypertension, hyperlipidemia, anxiety, obesity, peripheral vascular disease and chronic kidney disease. He has never been a smoker. Most recent lab work done at his PCPs office showed a glucose of 217 milligrams per deciliter which is consistent with hyperglycemia. In January 2017 recent hemoglobin A1c values noted to be 8.2%. In April 2017, he has been seen at the vascular office by Dr. Trula Slade and Dr. Kellie Simmering for right leg claudication. He had a right superficial femoral artery stent in September 2012. Recent noninvasive vascular imaging done on 06/24/2015 showed a right ABI of 1.07 with a triphasic  waveform and a right TBI of 0.81. The left was noncompressible with a triphasic waveform and a TBI of 1.17. Right lower extremity arterial duplex was unable to identify stent exit but they were elevated velocities at the stent and in the distal thigh with triphasic waveforms. Dr. Stephens Shire opinion was to return to the clinic in 6 months with ABI and right lower extremity arterial duplex studies to be done. He has seen a dermatologist who had done a biopsy and said it was benign and he was given a steroid cream. 10/23/2015 -- Pathology report of the biopsy done in April 2017 shows ulcer with underlying angiodermatitis consistent with stasis dermatitis. 10/30/2015 -- he is going for shoulder surgery in about 10 days' time and was asking about the perioperative care. His blood sugars are running in the  high 100s or low 200s. No hemoglobin A1c done recently. 11/20/2015 -- is back up to 2 weeks because of recent left shoulder surgery. 12/04/15; patient returns today with the wound for the most part looking healthy. No evidence of infection no debridement required. He is using Prisma for 4 weeks, without obvious improvement per her intake nurse. This started as several small open areas that were raised erythematous. He saw dermatology and at some point this was biopsied that just suggested stasis skin physiology. This certainly doesn't look like that 12/11/15; using Hydrafera Blue. Previous biopsy reviewed, no atypia PAS negative. He has a history of stents in the right leg however he does not appear to have primary arterial insufficiency ABI in this clinic was over 0.9. 12/25/2015 -- he has been approved for grafix and we will order for some to be applied next week 01/01/2016 -- he has had his first application of Grafix today 01/08/2016 -- he has had his second application of Grafix today READMISSION 12/04/2021 This is a now 72 year old man who was followed in our clinic about 5 years ago for an  ulceration on his right lateral lower leg, just distal to the knee. He has since undergone a number of revascularization procedures that have been complicated by restenosis and wound infections. During the course of this treatment, he developed a small ulcer on his right anterior tibial surface. Despite use of an Unna boot, the wound has continued to expand. He was referred to the wound care center by Dr. Trula Slade for further evaluation and management. On the right anterior tibial surface, there is an irregular wound with heavy slough and eschar accumulation. The periwound skin is intact but he does have 1-2+ pitting edema. There is no purulent drainage or malodor. 10/13; second visit for this man who has an ischemic wound in the setting of type 2 diabetes on the right anterior mid tibia area. He has been revascularized. Using Santyl gent 12/22/2021: The wound is about the same size this week. There is a lot of gray sloughy material on the surface, secondary to silver nitrate used to stop bleeding after what sounds like a fairly aggressive debridement last week. 12/30/2021: The wound is smaller this week and a bit cleaner. Edema control is excellent. There is still a bit of slough accumulation. 01/07/2022: The wound continues to contract and is much cleaner this week. Edema control is very good. He has small openings in his upper leg surgical scars, but he is going to be seeing Dr. Trula Slade next Monday and will have him take a look. He has been applying manuka honey to both of the sites. We have been using Santyl and gentamicin on his lower leg wound. 01/14/2022: He saw vascular surgery this week and was told that they were happy to have [place] manage the 2 wounds from his operation. Both of these have a light layer of slough on the surface. The more distal wound is a little bit dry. The anterior tibial wound continues to accumulate a fair amount of slough. Edema control is good. 01/19/2022: Both upper leg  wounds are smaller. The more distal is quite dry and cracked when I was examining it. The lower leg wound is more painful and the surrounding tissue is erythematous and indurated. 01/26/2022: The upper leg wounds are healed. The skin at the distal leg wound is quite dry. The lower leg wound is cleaner but still fairly painful. His wrap slid again this week. 02/02/2022: We had good success keeping  his wrap intact using a standard Unna boot. The lower leg wound continues to contract and is clean and less painful this week. Michael [name], GUPTE ([id number]) [id number] Page 7 of 11 12/11; TheraSkin #1 12/18; TheraSkin #2. Wound looks as though it is contracted. Patient states that his pain was better in the leg. Secondary dressing and kerlix Coban 02/24/2022: The wound is smaller since I saw it last. It is less painful and there is good granulation tissue, including over the exposed tendon. 03/09/2022: The wound continues to contract. There is continued fill of the wound cavity with granulation tissue. 03/16/2022: The wound is smaller again today. There is good granulation tissue forming, covering the tendon. Patient History Information obtained from Patient. Family History Cancer - Siblings, Heart Disease - Father, Hypertension - Father,Mother, No family history of Hereditary Spherocytosis, Kidney Disease, Seizures, Stroke, Thyroid Problems, Tuberculosis. Social History Former smoker - smokeless tobacco, Marital Status - Married, Alcohol Use - Rarely - WINE, Drug Use - No History, Caffeine Use - Daily - COFFEE. Medical History Ear/Nose/Mouth/Throat Patient has history of Chronic sinus problems/congestion - seasonal allergies Cardiovascular Patient has history of Hypertension, Peripheral Arterial Disease - STENTS, Peripheral Venous Disease Endocrine Patient has history of Type II Diabetes - last A1c- 8.2 Hospitalization/Surgery History - left shoulder surgery. Medical A  Surgical History Notes nd Constitutional Symptoms (General Health) obesity , h/o (R) leg claudication (right fem stent September 2012) Cardiovascular hyperlipidemia Endocrine pt. on diet intentionally losing 20 pounds since December 2016 in an effort to improve A1C levels Integumentary (Skin) psoriatic arthritis Musculoskeletal bilat knee pain identified as an ortho issue psoriatic arthritis Oncologic Prostate cancer 2018 Psychiatric anxiety Objective Constitutional Tachycardic, asymptomatic. no acute distress. Vitals Time Taken: 10:00 AM, Height: 72 in, Weight: 208 lbs, BMI: 28.2, Temperature: 98.0 F, Pulse: 120 bpm, Respiratory Rate: 18 breaths/min, Blood Pressure: 125/81 mmHg. Respiratory Normal work of breathing on room air. General Notes: 03/16/2022: The wound is smaller again today. There is good granulation tissue forming, covering the tendon. Integumentary (Hair, Skin) Wound #3 status is Open. Original cause of wound was Blister. The date acquired was: 09/30/2021. The wound has been in treatment 14 weeks. The wound is located on the Right,Anterior Lower Leg. The wound measures 2.8cm length x 2.3cm width x 0.2cm depth; 5.058cm^2 area and 1.012cm^3 volume. There is Fat Layer (Subcutaneous Tissue) exposed. There is no tunneling or undermining noted. There is a medium amount of serosanguineous drainage noted. The wound margin is distinct with the outline attached to the wound base. There is large (67-100%) red, pink granulation within the wound bed. There is a small (1-33%) amount of necrotic tissue within the wound bed including Adherent Slough. The periwound skin appearance had no abnormalities noted for texture. The periwound skin appearance had no abnormalities noted for moisture. The periwound skin appearance had no abnormalities noted for color. Periwound temperature was noted as No Abnormality. The periwound has tenderness on palpation. 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 There was a Selective/Open Wound Non-Viable Tissue Debridement with a total  area of 6.44 sq cm performed by Fredirick Maudlin, MD. With the following instrument(s): Curette to remove Non-Viable tissue/material. Material removed includes Eschar and Slough and after achieving pain control using Lidocaine 5% topical ointment. A time out was conducted at 10:16, prior to the start of the procedure. A Minimum amount of bleeding was controlled with Pressure. The procedure was tolerated well. Post Debridement Measurements: 2.8cm length x 2.3cm width x 0.2cm depth; 1.012cm^3 volume. Character of Wound/Ulcer Post Debridement is improved. Severity of Tissue Post Debridement is: Fat layer exposed. Post procedure Diagnosis Wound #3: Same as Pre-Procedure General Notes: Scribed for Dr. Celine Ahr by Blanche East, RN. Pre-procedure diagnosis of Wound #3 is a Diabetic Wound/Ulcer of the Lower Extremity located on the Right,Anterior Lower Leg. A skin graft procedure using a bioengineered skin substitute/cellular or tissue based product was performed by Fredirick Maudlin, MD with the following instrument(s): Curette, Forceps, and Scissors. Theraskin was applied and secured with Steri-Strips. 20 sq cm of product was utilized and 19 sq cm was wasted due to size of wound. Post Application, adaptec was applied. A Time Out was conducted at 10:21, prior to the start  of the procedure. The procedure was tolerated well with a pain level of 0 throughout and a pain level of 0 following the procedure. Post procedure Diagnosis Wound #3: Same as Pre-Procedure General Notes: Scribed for Dr. Celine Ahr by Blanche East, RN. Pre-procedure diagnosis of Wound #3 is a Diabetic Wound/Ulcer of the Lower Extremity located on the Right,Anterior Lower Leg . There was a Three Layer Compression Therapy Procedure by Blanche East, RN. Post procedure Diagnosis Wound #3: Same as Pre-Procedure Plan Follow-up Appointments: Return Appointment in 1 week. - Dr. Celine Ahr - room 4 Anesthetic: Wound #3 Right,Anterior Lower Leg: (In clinic) Topical Lidocaine 5% applied to wound bed Cellular or Tissue Based Products: Cellular or Tissue Based Product Type: - Theraskin 03/16/22 #4 Cellular or Tissue Based Product applied to wound bed, secured with steri-strips, cover with Adaptic or Mepitel. (DO NOT REMOVE). Edema Control - Lymphedema / SCD / Other: Avoid standing for long periods of time. WOUND #3: - Lower Leg Wound Laterality: Right, Anterior Cleanser: Soap and Water 1 x Per Week/30 Days Discharge Instructions: May shower and wash wound with dial antibacterial soap and water prior to dressing change. Cleanser: Wound Cleanser 1 x Per Week/30 Days Discharge Instructions: Cleanse the wound with wound cleanser prior to applying a clean dressing using gauze sponges, not tissue or cotton balls. Peri-Wound Care: Sween Lotion (Moisturizing lotion) 1 x Per Week/30 Days Discharge Instructions: Apply moisturizing lotion as directed Prim Dressing: ADAPTIC TOUCH 3x4.25 (in/in) 1 x Per Week/30 Days ary Discharge Instructions: Apply to wound bed as instructed Prim Dressing: Endoform 2x2 in 1 x Per Week/30 Days ary Discharge Instructions: Moisten with saline Secondary Dressing: ABD Pad, 8x10 1 x Per Week/30 Days Discharge Instructions: Apply over primary dressing as directed. Secondary Dressing: Woven  Gauze Sponge, Non-Sterile 4x4 in 1 x Per Week/30 Days Discharge Instructions: Apply over primary dressing as directed. Secured With: [employer] Size 5, 10 (yds) 1 x Per Week/30 Days Com pression Wrap: Unnaboot w/Calamine, 4x10 (in/yd) 1 x Per Week/30 Days Discharge Instructions: Apply Unnaboot to top of dressing and around ankles to prevent dressing from sliding Com pression Wrap: Kerlix Roll 4.5x3.1 (in/yd) 1 x Per Week/30 Days Discharge Instructions: Apply Kerlix and Coban compression as directed. Com pression Wrap: Coban Self-Adherent Wrap 4x5 (in/yd) 1 x Per Week/30 Days Discharge Instructions: Apply over Kerlix as directed.  03/16/2022: The wound is smaller again today. There is good granulation tissue forming, covering the tendon. I used a curette to debride slough and eschar from the wound. The TheraSkin (#4) was thawed and prepared. It was applied in standard fashion to the wound then secured with Adaptic and Steri-Strips. A gauze sponge was used as a bolster. Kerlix and Coban wrap. Nurse visit next week, follow-up with me the following week. Electronic Signature(s) Signed: 03/30/2022 10:28:32 AM By: [name] [name] ([id number]) [id number] Page 9 of 11 Signed: 04/27/2022 5:13:43 PM By: [name] Previous Signature: 03/16/2022 11:00:07 AM Version By: [name] Entered By: Blanche East on 03/30/2022 10:28:32 -------------------------------------------------------------------------------- HxROS Details Patient Name: Date of Service: [name], [name] 03/16/2022 10:00 A M Medical Record Number: [id number] Patient Account Number: [id number] Date of Birth/Sex: Treating RN: 05/11/1950 (72 y.o. M) Primary Care Provider: [name] Other Clinician: Referring Provider: Treating Provider/Extender: [name] in Treatment: 14 Information Obtained From Patient Constitutional Symptoms  (General Health) Medical History: Past Medical History Notes: obesity , h/o (R) leg claudication (right fem stent September 2012) Ear/Nose/Mouth/Throat Medical History: Positive for: Chronic sinus problems/congestion - seasonal allergies Cardiovascular Medical History: Positive for: Hypertension; Peripheral Arterial Disease - STENTS; Peripheral Venous Disease Past Medical History Notes: hyperlipidemia Endocrine Medical History: Positive for: Type II Diabetes - last A1c- 8.2 Past Medical History Notes: pt. on diet intentionally losing 20 pounds since December 2016 in an effort to improve A1C levels Time with diabetes: 9 YRS Treated with: Insulin Blood sugar tested every day: Yes Tested : 7-8 TIMES Integumentary (Skin) Medical History: Past Medical History Notes: psoriatic arthritis Musculoskeletal Medical History: Past Medical History Notes: bilat knee pain identified as an ortho issue psoriatic arthritis Oncologic Medical History: Past Medical History Notes: Prostate cancer 2018 Psychiatric Medical History: Past Medical History Notes: anxiety HBO Extended History Items Ear/Nose/Mouth/Throat: Chronic sinus problems/congestion Immunizations ECKER, REILLEY ([id number]) [id number] Page 10 of 11 Pneumococcal Vaccine: Received Pneumococcal Vaccination: Yes Received Pneumococcal Vaccination On or After 60th Birthday: Yes Implantable Devices None Hospitalization / Surgery History Type of Hospitalization/Surgery left shoulder surgery Family and Social History Cancer: Yes - Siblings; Heart Disease: Yes - Father; Hereditary Spherocytosis: No; Hypertension: Yes - Father,Mother; Kidney Disease: No; Seizures: No; Stroke: No; Thyroid Problems: No; Tuberculosis: No; Former smoker - smokeless tobacco; Marital Status - Married; Alcohol Use: Rarely - WINE; Drug Use: No History; Caffeine Use: Daily - COFFEE; Financial Concerns: No; Food, Clothing or  Shelter Needs: No; Support System Lacking: No; Transportation Concerns: No Electronic Signature(s) Signed: 03/16/2022 12:17:58 PM By: [name] Entered By: Fredirick Maudlin on 03/16/2022 10:56:38 -------------------------------------------------------------------------------- SuperBill Details Patient Name: Date of Service: [name], [place] C. 03/16/2022 Medical Record Number: [id number] Patient Account Number: [id number] Date of Birth/Sex: Treating RN: [birth year] (72 y.o. 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 E11.622 Type 2 diabetes mellitus with other skin ulcer I73.9 Peripheral vascular disease, unspecified Facility Procedures : CPT4 Code Description: [id number] - SKIN SUB GRAFT TRNK/ARM/LEG ICD-10 Diagnosis Description L97.815 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 involvemen Modifier: t without evidence Quantity: 1 of necrosis :  CPT4 Code Description: [id number]- Theraskin per 1sq cm Large -39 sq cm Modifier: Quantity: 34 Physician Procedures : CPT4 Code Description Modifier [id number] - WC PHYS LEVEL 3 - EST PT 25 ICD-10 Diagnosis Description L97.815 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 without evidence o I70.239 Atherosclerosis of native  arteries of right leg with ulceration of unspecified site E11.622 Type 2 diabetes mellitus with other skin ulcer I73.9 Peripheral vascular disease, unspecified Quantity: 1 f necrosis : [id number] - WC PHYS SKIN SUB GRAFT TRNK/ARM/LEG ICD-10 Diagnosis  Description L97.815 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

## 2022-03-17 NOTE — Progress Notes (Addendum)
Michael Michael [name], Michael Michael [name] ([id number]) [id number] Page 1 of 8 Visit Report for 03/16/2022 Arrival Information Details Patient Name: Date of Service: HO [place], [place] [name] 03/16/2022 10:00 A M Medical Record Number: [id number] Patient Account Number: [id number] Date of Birth/Sex: Treating RN: January 31, 1951 (72 y.o. Michael Michael [name] Primary Care Michael Michael [name]: Michael Michael [name] Other Clinician: Referring Michael Michael [name]: Treating Michael Michael [name]/Extender: Michael Michael [name] in Treatment: 14 Visit Information History Since Last Visit Added or deleted any medications: No Patient Arrived: Ambulatory Any new allergies or adverse reactions: No Arrival Time: 09:56 Had a fall or experienced change in No Accompanied By: wife activities of daily living that may affect Transfer Assistance: None risk of falls: Patient Requires Transmission-Based Precautions: No Signs or symptoms of abuse/neglect since last visito No Patient Has Alerts: Yes Hospitalized since last visit: No Patient Alerts: Patient on Blood Thinner Implantable device outside of the clinic excluding No plavix cellular tissue based products placed in the center since last visit: Has Compression in Place as Prescribed: Yes Pain Present Now: Yes Electronic Signature(s) Signed: 03/17/2022 3:04:08 PM By: Michael East RN Entered By: Michael Michael [name] on 03/16/2022 10:02:35 -------------------------------------------------------------------------------- Complex / Palliative Patient Assessment Details Patient Name: Date of Service: [name], [place] L[place] [name] 03/16/2022 10:00 A M Medical Record Number: [id number] Patient Account Number: [id number] Date of Birth/Sex: Treating RN: November 06, 1950 (72 y.o. Michael Michael [name] Primary Care Michael Michael [name]: Michael Michael [name] Other Clinician: Referring Michael Michael [name]: Treating Michael Michael [name]/Extender: Michael Michael [name] in Treatment: 14 Complex Wound Management Criteria Patient has remarkable  or complex co-morbidities requiring medications or treatments that extend wound healing times. Examples: Diabetes mellitus with chronic renal failure or end stage renal disease requiring dialysis Advanced or poorly controlled rheumatoid arthritis Diabetes mellitus and end stage chronic obstructive pulmonary disease Active cancer with current chemo- or radiation therapy diabetes, PAD, CKD, HTN Palliative Wound Management Criteria Care Approach Wound Care Plan: Complex Wound Management Electronic Signature(s) Signed: 04/14/2022 5:50:45 PM By: Michael Gouty RN, [name] Signed: 04/27/2022 5:13:43 PM By: Michael Maudlin MD FACS Previous Signature: 03/25/2022 5:34:56 PM Version By: Michael Gouty RN, [name] Entered By: Michael Michael [name] on 03/25/2022 17:35:42 -------------------------------------------------------------------------------- Compression Therapy Details Patient Name: Date of Service: HO [place], [place] [name] 03/16/2022 10:00 A M Medical Record Number: [id number] Patient Account Number: [id number] Date of Birth/Sex: Treating RN: [birth year] (72 y.o. Michael Michael [name] Primary Care Michael Michael [name]: Michael Michael [name] Other Clinician: Referring Michael Michael [name]: Treating Michael Michael [name]/Extender: Michael Michael [name], Michael Michael [name] ([id number]) [id number] Page 2 of 8 Weeks in Treatment: 14 Compression Therapy Performed for Wound Assessment: Wound #3 Right,Anterior Lower Leg Performed By: Clinician Michael East, RN Compression Type: Three Layer Post Procedure Diagnosis Same as Pre-procedure Electronic Signature(s) Signed: 03/17/2022 3:04:08 PM By: Michael East RN Entered By: Michael Michael [name] on 03/16/2022 10:27:31 -------------------------------------------------------------------------------- Encounter Discharge Information Details Patient Name: Date of Service: HO [place], [place] [name] 03/16/2022 10:00 [place] Record Number: [id number] Patient Account Number: [id number] Date of  Birth/Sex: Treating RN: 26-Dec-1950 (72 y.o. Michael Michael [name] Primary Care Michael Michael [name]: Michael Michael [name] Other Clinician: Referring Michael Michael [name]: Treating Michael Michael [name]/Extender: Michael Michael [name] in Treatment: 14 Encounter Discharge Information Items Post Procedure Vitals Discharge Condition: Stable Temperature (F): 98.0 Ambulatory Status: Ambulatory Pulse (bpm): 120 Discharge Destination: Home Respiratory Rate (breaths/min): 18 Transportation: Private Auto Blood Pressure (mmHg): 125/81 Accompanied By: self Schedule Follow-up Appointment: Yes Clinical Summary of Care: Electronic Signature(s) Signed: 03/17/2022 3:04:08 PM By: Michael East RN Entered By: Michael Michael [name] on 03/16/2022 10:29:29 -------------------------------------------------------------------------------- Lower Extremity Assessment  Details Patient Name: Date of Service: HO Michael Michael [name], [place] [name] 03/16/2022 10:00 A M Medical Record Number: [id number] Patient Account Number: [id number] Date of Birth/Sex: Treating RN: 06-Jul-1950 (72 y.o. Michael Michael [name] Primary Care Michael Michael [name]: Michael Michael [name] Other Clinician: Referring Michael Michael [name]: Treating Michael Michael [name]/Extender: Michael Michael [name] in Treatment: 14 Edema Assessment Assessed: Michael Michael [name]: No] Michael Michael [name]: No] Edema: [Left: Ye] [Right: s] Calf Left: Right: Point of Measurement: 35 cm From Medial Instep 33.4 cm Ankle Left: Right: Point of Measurement: 12 cm From Medial Instep 22 cm Vascular Assessment Pulses: Dorsalis Pedis Palpable: [Right:Yes] Electronic Signature(s) Signed: 03/17/2022 3:04:08 PM By: Michael East RN Michael Michael [name] ([id number]) By: Michael East RN [phone].pdf Page 3 of 8 Signed: 03/17/2022 3:04:08 PM Entered By: Michael Michael [name] on 03/16/2022 10:03:21 -------------------------------------------------------------------------------- Multi Wound Chart Details Patient Name: Date of Service: HO [place], [place] [name]  03/16/2022 10:00 A M Medical Record Number: [id number] Patient Account Number: [id number] Date of Birth/Sex: Treating RN: 10-29-1950 (72 y.o. M) Primary Care Michael Michael [name]: Michael Michael [name] Other Clinician: Referring Kamren Heintzelman: Treating Sharnise Blough/Extender: Michael Michael [name] in Treatment: 14 Vital Signs Height(in): 72 Pulse(bpm): 120 Weight(lbs): 208 Blood Pressure(mmHg): 125/81 Body Mass Index(BMI): 28.2 Temperature(F): 98.0 Respiratory Rate(breaths/min): 18 [3:Photos:] [N/A:N/A] Right, Anterior Lower Leg N/A N/A Wound Location: Blister N/A N/A Wounding Event: Diabetic Wound/Ulcer of the Lower N/A N/A Primary Etiology: Extremity Chronic sinus problems/congestion, N/A N/A Comorbid History: Hypertension, Peripheral Arterial Disease, Peripheral Venous Disease, Type II Diabetes 09/30/2021 N/A N/A Date Acquired: 14 N/A N/A Weeks of Treatment: Open N/A N/A Wound Status: No N/A N/A Wound Recurrence: 2.8x2.3x0.2 N/A N/A Measurements L x W x D (cm) 5.058 N/A N/A A (cm) : rea 1.012 N/A N/A Volume (cm) : 71.20% N/A N/A % Reduction in A rea: 80.80% N/A N/A % Reduction in Volume: Grade 2 N/A N/A Classification: Medium N/A N/A Exudate A mount: Serosanguineous N/A N/A Exudate Type: red, brown N/A N/A Exudate Color: Distinct, outline attached N/A N/A Wound Margin: Large (67-100%) N/A N/A Granulation A mount: Red, Pink N/A N/A Granulation Quality: Small (1-33%) N/A N/A Necrotic A mount: Fat Layer (Subcutaneous Tissue): Yes N/A N/A Exposed Structures: Fascia: No Tendon: No Muscle: No Joint: No Bone: No Small (1-33%) N/A N/A Epithelialization: Debridement - Selective/Open Wound N/A N/A Debridement: Pre-procedure Verification/Time Out 10:16 N/A N/A Taken: Lidocaine 5% topical ointment N/A N/A Pain Control: Slough N/A N/A Tissue Debrided: Non-Viable Tissue N/A N/A Level: 6.44 N/A N/A Debridement A (sq cm): rea Curette N/A  N/A Instrument: Minimum N/A N/A Bleeding: Pressure N/A N/A Hemostasis A chieved: Procedure was tolerated well N/A N/A Debridement Treatment Response: 2.8x2.3x0.2 N/A N/A Post Debridement Measurements L x W x D (cm) 1.012 N/A N/A Post Debridement Volume: (cm) No Abnormalities Noted N/A N/A Periwound Skin Texture: Maceration: Yes N/A N/A Periwound Skin Moisture: Michael Michael [name], Michael Michael [name] ([id number]) [id number] Page 4 of 8 Atrophie Michael: No N/A N/A Periwound Skin Color: Cyanosis: No Erythema: No No Abnormality N/A N/A Temperature: Yes N/A N/A Tenderness on Palpation: Cellular or Tissue Based Product N/A N/A Procedures Performed: Compression Therapy Debridement Treatment Notes Wound #3 (Lower Leg) Wound Laterality: Right, Anterior Cleanser Soap and Water Discharge Instruction: May shower and wash wound with dial antibacterial soap and water prior to dressing change. Wound Cleanser Discharge Instruction: Cleanse the wound with wound cleanser prior to applying a clean dressing using gauze sponges, not tissue or cotton balls. Peri-Wound Care Sween Lotion (Moisturizing lotion) Discharge Instruction: Apply moisturizing lotion as directed Topical Primary Dressing ADAPTIC TOUCH 3x4.25 (  in/in) Discharge Instruction: Apply to wound bed as instructed Endoform 2x2 in Discharge Instruction: Moisten with saline Secondary Dressing ABD Pad, 8x10 Discharge Instruction: Apply over primary dressing as directed. Woven Gauze Sponge, Non-Sterile 4x4 in Discharge Instruction: Apply over primary dressing as directed. Secured With [employer] Size 5, 10 (yds) Compression Wrap Unnaboot w/Calamine, 4x10 (in/yd) Discharge Instruction: Apply Unnaboot to top of dressing and around ankles to prevent dressing from sliding Kerlix Roll 4.5x3.1 (in/yd) Discharge Instruction: Apply Kerlix and Coban compression as directed. Coban Self-Adherent Wrap 4x5 (in/yd) Discharge  Instruction: Apply over Kerlix as directed. Compression Stockings Add-Ons Electronic Signature(s) Signed: 03/16/2022 10:55:02 AM By: Michael Maudlin MD FACS Entered By: Michael Michael [name] on 03/16/2022 10:55:02 -------------------------------------------------------------------------------- Multi-Disciplinary Care Plan Details Patient Name: Date of Service: HO [place], [place] LTER [name] 03/16/2022 10:00 A M Medical Record Number: [id number] Patient Account Number: [id number] Date of Birth/Sex: Treating RN: Jul 23, 1950 (72 y.o. Michael Michael [name] Primary Care Zamora Colton: Michael Michael [name] Other Clinician: Referring Tenishia Ekman: Treating Keon Pender/Extender: Michael Michael [name] in Treatment: [address] Inactive Nutrition Michael Michael [name], Michael Michael [name] ([id number]) [id number] Page 5 of 8 Nursing Diagnoses: Potential for alteratiion in Nutrition/Potential for imbalanced nutrition Goals: Patient/caregiver agrees to and verbalizes understanding of need to use nutritional supplements and/or vitamins as prescribed Date Initiated: 12/04/2021 Target Resolution Date: 04/30/2022 Goal Status: Active Patient/caregiver verbalizes understanding of need to maintain therapeutic glucose control per primary care physician Date Initiated: 12/04/2021 Target Resolution Date: 04/30/2022 Goal Status: Active Interventions: Assess patient nutrition upon admission and as needed per policy Provide education on elevated blood sugars and impact on wound healing Provide education on nutrition Treatment Activities: Education provided on Nutrition : 01/19/2022 Notes: Wound/Skin Impairment Nursing Diagnoses: Impaired tissue integrity Knowledge deficit related to ulceration/compromised skin integrity Goals: Patient/caregiver will verbalize understanding of skin care regimen Date Initiated: 12/04/2021 Date Inactivated: 02/02/2022 Target Resolution Date: 01/28/2022 Goal Status: Met Ulcer/skin breakdown will  have a volume reduction of 30% by week 4 Date Initiated: 12/04/2021 Date Inactivated: 01/07/2022 Target Resolution Date: 12/26/2021 Goal Status: Met Ulcer/skin breakdown will have a volume reduction of 50% by week 8 Date Initiated: 01/07/2022 Date Inactivated: 03/16/2022 Target Resolution Date: 04/01/2022 Goal Status: Met Ulcer/skin breakdown will have a volume reduction of 80% by week 12 Date Initiated: 03/16/2022 Target Resolution Date: 04/30/2022 Goal Status: Active Interventions: Assess ulceration(s) every visit Provide education on ulcer and skin care Notes: Electronic Signature(s) Signed: 03/17/2022 3:04:08 PM By: Michael East RN Entered By: Michael Michael [name] on 03/16/2022 10:28:13 -------------------------------------------------------------------------------- Pain Assessment Details Patient Name: Date of Service: [name], [place] [name] 03/16/2022 10:00 A M Medical Record Number: [id number] Patient Account Number: [id number] Date of Birth/Sex: Treating RN: 1951-01-17 (72 y.o. Michael Michael [name] Primary Care Joandy Burget: Michael Michael [name] Other Clinician: Referring Coriana Angello: Treating Cleola Perryman/Extender: Michael Michael [name] in Treatment: 14 Active Problems Location of Pain Severity and Description of Pain Patient Has Paino Yes Site Locations Rate the pain. Michael Michael [name], Michael Michael [name] ([id number]) [id number] Page 6 of 8 Rate the pain. Current Pain Level: 3 Character of Pain Describe the Pain: Sharp Pain Management and Medication Current Pain Management: Electronic Signature(s) Signed: 03/17/2022 3:04:08 PM By: Michael East RN Entered By: Michael Michael [name] on 03/16/2022 10:03:07 -------------------------------------------------------------------------------- Patient/Caregiver Education Details Patient Name: Date of Service: [name], [place]. 1/15/2024andnbsp10:00 [place] Record Number: [id number] Patient Account Number: [id number] Date of  Birth/Gender: Treating RN: 05-29-1950 (72 y.o. Michael Michael [name] Primary Care Physician: Michael Michael [name] Other Clinician: Referring Physician: Treating Physician/Extender: Michael Michael [name]  Stamey, Carmela Hurt in Treatment: 14 Education Assessment Education Provided To: Patient Education Topics Provided Wound/Skin Impairment: Methods: Explain/Verbal Responses: Reinforcements needed, State content correctly Electronic Signature(s) Signed: 03/17/2022 3:04:08 PM By: Michael East RN Entered By: Michael Michael [name] on 03/16/2022 10:28:33 -------------------------------------------------------------------------------- Wound Assessment Details Patient Name: Date of Service: HO [place], [place] [name] 03/16/2022 10:00 A M Medical Record Number: [id number] Patient Account Number: [id number] Date of Birth/Sex: Treating RN: 06/09/1950 (72 y.o. Michael Michael [name] Primary Care Cloris Flippo: Michael Michael [name] Other Clinician: Referring Yahmir Sokolov: Treating Armonie Staten/Extender: Michael Michael [name] in Treatment: 14 Wound Status Wound Number: 3 Primary Diabetic Wound/Ulcer of the Lower Extremity Etiology: Wound Location: Right, Anterior Lower Leg Wound Open Wounding Event: Blister Michael Michael [name], Michael Michael [name] ([id number]) [phone].pdf Page 7 of 8 Wounding Event: Blister Status: Date Acquired: 09/30/2021 Comorbid Chronic sinus problems/congestion, Hypertension, Peripheral Weeks Of Treatment: 14 History: Arterial Disease, Peripheral Venous Disease, Type II Diabetes Clustered Wound: No Photos Wound Measurements Length: (cm) 2.8 Width: (cm) 2.3 Depth: (cm) 0.2 Area: (cm) 5.058 Volume: (cm) 1.012 % Reduction in Area: 71.2% % Reduction in Volume: 80.8% Epithelialization: Small (1-33%) Tunneling: No Undermining: No Wound Description Classification: Grade 2 Wound Margin: Distinct, outline attached Exudate Amount: Medium Exudate Type: Serosanguineous Exudate Color: red, brown Foul  Odor After Cleansing: No Slough/Fibrino Yes Wound Bed Granulation Amount: Large (67-100%) Exposed Structure Granulation Quality: Red, Pink Fascia Exposed: No Necrotic Amount: Small (1-33%) Fat Layer (Subcutaneous Tissue) Exposed: Yes Necrotic Quality: Adherent Slough Tendon Exposed: No Muscle Exposed: No Joint Exposed: No Bone Exposed: No Periwound Skin Texture Texture Color No Abnormalities Noted: Yes No Abnormalities Noted: Yes Moisture Temperature / Pain No Abnormalities Noted: Yes Temperature: No Abnormality Tenderness on Palpation: Yes Electronic Signature(s) Signed: 03/17/2022 3:04:08 PM By: Michael East RN Entered By: Michael Michael [name] on 03/16/2022 10:04:48 -------------------------------------------------------------------------------- Vitals Details Patient Name: Date of Service: [name], [place] [name] 03/16/2022 10:00 A M Medical Record Number: [id number] Patient Account Number: [id number] Date of Birth/Sex: Treating RN: 06-13-1950 (71 y.o. Michael Michael [name] Primary Care Tonnia Bardin: Michael Michael [name] Other Clinician: Referring Merrillyn Ackerley: Treating Joban Colledge/Extender: Michael Michael [name] in Treatment: 14 Vital Signs Time Taken: 10:00 Temperature (F): 98.0 Height (in): 72 Pulse (bpm): 120 Weight (lbs): 208 Respiratory Rate (breaths/min): 18 Body Mass Index (BMI): 28.2 Blood Pressure (mmHg): 125/81 Reference Range: 80 - 120 mg / dl Michael Michael [name], Michael Michael [name] ([id number]) [id number] Page 8 of 8 Electronic Signature(s) Signed: 03/17/2022 3:04:08 PM By: Michael East RN Entered By: Michael Michael [name] on 03/16/2022 10:02:56

## 2022-03-20 DIAGNOSIS — F411 Generalized anxiety disorder: Secondary | ICD-10-CM | POA: Diagnosis not present

## 2022-03-20 DIAGNOSIS — C61 Malignant neoplasm of prostate: Secondary | ICD-10-CM | POA: Diagnosis not present

## 2022-03-20 DIAGNOSIS — Z125 Encounter for screening for malignant neoplasm of prostate: Secondary | ICD-10-CM | POA: Diagnosis not present

## 2022-03-20 DIAGNOSIS — I10 Essential (primary) hypertension: Secondary | ICD-10-CM | POA: Diagnosis not present

## 2022-03-20 DIAGNOSIS — Z0000 Encounter for general adult medical examination without abnormal findings: Secondary | ICD-10-CM | POA: Diagnosis not present

## 2022-03-20 DIAGNOSIS — E782 Mixed hyperlipidemia: Secondary | ICD-10-CM | POA: Diagnosis not present

## 2022-03-20 DIAGNOSIS — Z23 Encounter for immunization: Secondary | ICD-10-CM | POA: Diagnosis not present

## 2022-03-20 DIAGNOSIS — E1151 Type 2 diabetes mellitus with diabetic peripheral angiopathy without gangrene: Secondary | ICD-10-CM | POA: Diagnosis not present

## 2022-03-23 ENCOUNTER — Encounter (HOSPITAL_BASED_OUTPATIENT_CLINIC_OR_DEPARTMENT_OTHER): Payer: Medicare Other | Admitting: General Surgery

## 2022-03-23 DIAGNOSIS — E669 Obesity, unspecified: Secondary | ICD-10-CM | POA: Diagnosis not present

## 2022-03-23 DIAGNOSIS — I70239 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: Secondary | ICD-10-CM | POA: Diagnosis not present

## 2022-03-23 DIAGNOSIS — I129 Hypertensive chronic kidney disease with stage 1 through stage 4 chronic kidney disease, or unspecified chronic kidney disease: Secondary | ICD-10-CM | POA: Diagnosis not present

## 2022-03-23 DIAGNOSIS — L97122 Non-pressure chronic ulcer of left thigh with fat layer exposed: Secondary | ICD-10-CM | POA: Diagnosis not present

## 2022-03-23 DIAGNOSIS — L97815 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: Secondary | ICD-10-CM | POA: Diagnosis not present

## 2022-03-23 DIAGNOSIS — N189 Chronic kidney disease, unspecified: Secondary | ICD-10-CM | POA: Diagnosis not present

## 2022-03-23 DIAGNOSIS — E1151 Type 2 diabetes mellitus with diabetic peripheral angiopathy without gangrene: Secondary | ICD-10-CM | POA: Diagnosis not present

## 2022-03-23 DIAGNOSIS — E11622 Type 2 diabetes mellitus with other skin ulcer: Secondary | ICD-10-CM | POA: Diagnosis not present

## 2022-03-23 DIAGNOSIS — E1122 Type 2 diabetes mellitus with diabetic chronic kidney disease: Secondary | ICD-10-CM | POA: Diagnosis not present

## 2022-03-23 NOTE — Progress Notes (Signed)
HOBSON, LA[facility] ([id number]) [id number] Page 1 of 1 Visit Report for 03/23/2022 SuperBill Details Patient Name: Date of Service: HO [place] Dad 03/23/2022 Medical Record Number: [id number] Patient Account Number: [id number] Date of Birth/Sex: Treating RN: Feb 13, 1951 (72 y.o. 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

## 2022-03-23 NOTE — Progress Notes (Signed)
KIANDRE, SPAGNOLO ([id number]) [id number] Page 1 of 6 Visit Report for 03/23/2022 Arrival Information Details Patient Name: Date of Service: HO FFMA [place], [name] 03/23/2022 11:15 A M Medical Record Number: [id number] Patient Account Number: [id number] Date of Birth/Sex: Treating RN: 09-08-1950 (72 y.o. [name] Primary Care Stacey Sago: [name] Other Clinician: Referring Raydell Maners: Treating Roselene Gray/Extender: [name] in Treatment: 15 Visit Information History Since Last Visit Added or deleted any medications: No Patient Arrived: Ambulatory Any new allergies or adverse reactions: No Arrival Time: 11:06 Had a fall or experienced change in No Accompanied By: wife activities of daily living that may affect Transfer Assistance: None risk of falls: Patient Identification Verified: Yes Signs or symptoms of abuse/neglect since last visito No Secondary Verification Process Completed: Yes Hospitalized since last visit: No Patient Requires Transmission-Based Precautions: No Implantable device outside of the clinic excluding No Patient Has Alerts: Yes cellular tissue based products placed in the center Patient Alerts: Patient on Blood Thinner since last visit: plavix Has Dressing in Place as Prescribed: Yes Has Compression in Place as Prescribed: Yes Pain Present Now: Yes Electronic Signature(s) Signed: 03/23/2022 4:31:10 PM By: Adline Peals Entered By: Adline Peals on 03/23/2022 11:07:53 -------------------------------------------------------------------------------- Clinic Level of Care Assessment Details Patient Name: Date of Service: [name], [name] 03/23/2022 11:15 A M Medical Record Number: [id number] Patient Account Number: [id number] Date of Birth/Sex: Treating RN: 1950-12-10 (72 y.o. [name] Primary Care Juddson Cobern: [name] Other Clinician: Referring Tyonna Talerico: Treating  Lucillie Kiesel/Extender: [name] in Treatment: 15 Clinic Level of Care Assessment Items TOOL 4 Quantity Score X- 1 0 Use when only an EandM is performed on FOLLOW-UP visit ASSESSMENTS - Nursing Assessment / Reassessment '[]'$  - 0 Reassessment of Co-morbidities (includes updates in patient status) '[]'$  - 0 Reassessment of Adherence to Treatment Plan ASSESSMENTS - Wound and Skin A ssessment / Reassessment X - Simple Wound Assessment / Reassessment - one wound 1 5 '[]'$  - 0 Complex Wound Assessment / Reassessment - multiple wounds '[]'$  - 0 Dermatologic / Skin Assessment (not related to wound area) ASSESSMENTS - Focused Assessment X- 1 5 Circumferential Edema Measurements - multi extremities '[]'$  - 0 Nutritional Assessment / Counseling / Intervention APOLLOS, TENBRINK ([id number]) [id number] Page 2 of 6 '[]'$  - 0 Lower Extremity Assessment (monofilament, tuning fork, pulses) '[]'$  - 0 Peripheral Arterial Disease Assessment (using hand held doppler) ASSESSMENTS - Ostomy and/or Continence Assessment and Care '[]'$  - 0 Incontinence Assessment and Management '[]'$  - 0 Ostomy Care Assessment and Management (repouching, etc.) PROCESS - Coordination of Care X - Simple Patient / Family Education for ongoing care 1 15 '[]'$  - 0 Complex (extensive) Patient / Family Education for ongoing care X- 1 10 Staff obtains Programmer, systems, Records, T Results / Process Orders est X- 1 10 Staff telephones HHA, Nursing Homes / Clarify orders / etc '[]'$  - 0 Routine Transfer to another Facility (non-emergent condition) '[]'$  - 0 Routine Hospital Admission (non-emergent condition) '[]'$  - 0 New Admissions / Biomedical engineer / Ordering NPWT Apligraf, etc. , '[]'$  - 0 Emergency Hospital Admission (emergent condition) '[]'$  - 0 Simple Discharge Coordination '[]'$  - 0 Complex (extensive) Discharge Coordination PROCESS - Special Needs '[]'$  - 0 Pediatric / Minor Patient Management '[]'$  -  0 Isolation Patient Management '[]'$  - 0 Hearing / Language / Visual special needs '[]'$  - 0 Assessment of Community assistance (transportation, D/C planning, etc.) '[]'$  - 0 Additional assistance / Altered mentation '[]'$  - 0  Support Surface(s) Assessment (bed, cushion, seat, etc.) INTERVENTIONS - Wound Cleansing / Measurement X - Simple Wound Cleansing - one wound 1 5 '[]'$  - 0 Complex Wound Cleansing - multiple wounds '[]'$  - 0 Wound Imaging (photographs - any number of wounds) '[]'$  - 0 Wound Tracing (instead of photographs) '[]'$  - 0 Simple Wound Measurement - one wound '[]'$  - 0 Complex Wound Measurement - multiple wounds INTERVENTIONS - Wound Dressings X - Small Wound Dressing one or multiple wounds 1 10 '[]'$  - 0 Medium Wound Dressing one or multiple wounds '[]'$  - 0 Large Wound Dressing one or multiple wounds '[]'$  - 0 Application of Medications - topical '[]'$  - 0 Application of Medications - injection INTERVENTIONS - Miscellaneous '[]'$  - 0 External ear exam '[]'$  - 0 Specimen Collection (cultures, biopsies, blood, body fluids, etc.) '[]'$  - 0 Specimen(s) / Culture(s) sent or taken to Lab for analysis '[]'$  - 0 Patient Transfer (multiple staff / Civil Service fast streamer / Similar devices) '[]'$  - 0 Simple Staple / Suture removal (25 or less) '[]'$  - 0 Complex Staple / Suture removal (26 or more) '[]'$  - 0 Hypo / Hyperglycemic Management (close monitor of Blood Glucose) Ebner, Dennys C ([id number]) [id number] Page 3 of 6 '[]'$  - 0 Ankle / Brachial Index (ABI) - do not check if billed separately X- 1 5 Vital Signs Has the patient been seen at the hospital within the last three years: Yes Total Score: 65 Level Of Care: New/Established - Level 2 Electronic Signature(s) Signed: 03/23/2022 4:52:32 PM By: [name] Entered By: Blanche East on 03/23/2022 11:44:03 -------------------------------------------------------------------------------- Compression Therapy Details Patient Name: Date of  Service: [name], [name] 03/23/2022 11:15 A M Medical Record Number: [id number] Patient Account Number: [id number] Date of Birth/Sex: Treating RN: [birth year] (72 y.o. [name] Primary Care Alexiana Laverdure: [name] Other Clinician: Referring Gelsey Amyx: Treating Leimomi Zervas/Extender: [name] in Treatment: 15 Compression Therapy Performed for Wound Assessment: Wound #3 Right,Anterior Lower Leg Performed By: Clinician Blanche East, RN Compression Type: Double Layer Electronic Signature(s) Signed: 03/23/2022 4:52:32 PM By: [name] Entered By: Blanche East on 03/23/2022 11:27:07 -------------------------------------------------------------------------------- Encounter Discharge Information Details Patient Name: Date of Service: [name], [name] 03/23/2022 11:15 A M Medical Record Number: [id number] Patient Account Number: [id number] Date of Birth/Sex: Treating RN: [birth year] (72 y.o. [name] Primary Care Tishara Pizano: [name] Other Clinician: Referring Jonavon Trieu: Treating Elisabella Hacker/Extender: [name] in Treatment: 15 Encounter Discharge Information Items Discharge Condition: Stable Ambulatory Status: Ambulatory Discharge Destination: Home Transportation: Private Auto Accompanied By: wife Schedule Follow-up Appointment: Yes Clinical Summary of Care: Electronic Signature(s) Signed: 03/23/2022 4:52:32 PM By: [name] Entered By: Blanche East on 03/23/2022 11:26:40 [name] ([id number]) [id number] Page 4 of 6 -------------------------------------------------------------------------------- Pain Assessment Details Patient Name: Date of Service: [name], [name] 03/23/2022 11:15 A M Medical Record Number: [id number] Patient Account Number: [id number] Date of Birth/Sex: Treating RN: January 07, 1951 (72 y.o. [name] Primary Care Vi Whitesel:  [name] Other Clinician: Referring Zandria Woldt: Treating Zaven Klemens/Extender: [name] in Treatment: 15 Active Problems Location of Pain Severity and Description of Pain Patient Has Paino Yes Site Locations Pain Location: Pain in Ulcers Duration of the Pain. Constant / Intermittento Intermittent Rate the pain. Current Pain Level: 3 Character of Pain Describe the Pain: Throbbing Pain Management and Medication Current Pain Management: Medication: Yes Electronic Signature(s) Signed: 03/23/2022 4:31:10 PM By: Adline Peals Entered By: Adline Peals  on 03/23/2022 11:08:09 -------------------------------------------------------------------------------- Patient/Caregiver Education Details Patient Name: Date of Service: HO FFMA [place], [place]. 1/22/2024andnbsp11:15 [place] Record Number: [id number] Patient Account Number: [id number] Date of Birth/Gender: Treating RN: January 30, 1951 (72 y.o. [name] Primary Care Physician: [name] Other Clinician: Referring Physician: Treating Physician/Extender: [name] in Treatment: 15 Education Assessment Education Provided To: Patient Education Topics Provided Wound/Skin Impairment: Methods: Explain/Verbal Responses: Reinforcements needed, State content correctly GILLIS, BOARDLEY ([id number]) [phone].pdf Page 5 of 6 Electronic Signature(s) Signed: 03/23/2022 4:52:32 PM By: [name] Entered By: Blanche East on 03/23/2022 11:26:21 -------------------------------------------------------------------------------- Wound Assessment Details Patient Name: Date of Service: [name], [name] 03/23/2022 11:15 A M Medical Record Number: [id number] Patient Account Number: [id number] Date of Birth/Sex: Treating RN: 1950-04-15 (72 y.o. [name] Primary Care Jamell Laymon: [name] Other Clinician: Referring Lorayne Getchell: Treating  Jayd Cadieux/Extender: [name] in Treatment: 15 Wound Status Wound Number: 3 Primary Etiology: Diabetic Wound/Ulcer of the Lower Extremity Wound Location: Right, Anterior Lower Leg Wound Status: Open Wounding Event: Blister Date Acquired: 09/30/2021 Weeks Of Treatment: 15 Clustered Wound: No Wound Measurements Length: (cm) 2.8 Width: (cm) 2.3 Depth: (cm) 0.2 Area: (cm) 5.058 Volume: (cm) 1.012 % Reduction in Area: 71.2% % Reduction in Volume: 80.8% Wound Description Classification: Grade 2 Exudate Amount: Medium Exudate Type: Serosanguineous Exudate Color: red, brown Periwound Skin Texture Texture Color No Abnormalities Noted: No No Abnormalities Noted: No Moisture No Abnormalities Noted: No Treatment Notes Wound #3 (Lower Leg) Wound Laterality: Right, Anterior Cleanser Soap and Water Discharge Instruction: May shower and wash wound with dial antibacterial soap and water prior to dressing change. Wound Cleanser Discharge Instruction: Cleanse the wound with wound cleanser prior to applying a clean dressing using gauze sponges, not tissue or cotton balls. Peri-Wound Care Sween Lotion (Moisturizing lotion) Discharge Instruction: Apply moisturizing lotion as directed Topical Primary Dressing ADAPTIC TOUCH 3x4.25 (in/in) Discharge Instruction: Apply to wound bed as instructed Secondary Dressing ABD Pad, 8x10 Discharge Instruction: Apply over primary dressing as directed. Woven Gauze Sponge, Non-Sterile 4x4 in ERIK, BURKETT ([id number]) [id number] Page 6 of 6 Discharge Instruction: Apply over primary dressing as directed. Secured With [employer] Size 5, 10 (yds) Compression Wrap Unnaboot w/Calamine, 4x10 (in/yd) Discharge Instruction: Apply Unnaboot to top of dressing and around ankles to prevent dressing from sliding Kerlix Roll 4.5x3.1 (in/yd) Discharge Instruction: Apply Kerlix and Coban compression as  directed. Coban Self-Adherent Wrap 4x5 (in/yd) Discharge Instruction: Apply over Kerlix as directed. Compression Stockings Add-Ons Electronic Signature(s) Signed: 03/23/2022 4:52:32 PM By: [name] Entered By: Blanche East on 03/23/2022 11:25:05 -------------------------------------------------------------------------------- Vitals Details Patient Name: Date of Service: [name], [name] 03/23/2022 11:15 A M Medical Record Number: [id number] Patient Account Number: [id number] Date of Birth/Sex: Treating RN: 10-08-1950 (72 y.o. [name] Primary Care Alegandro Macnaughton: [name] Other Clinician: Referring Claudio Mondry: Treating Paizlie Klaus/Extender: [name] in Treatment: 15 Vital Signs Time Taken: 11:08 Temperature (F): 97.8 Height (in): 72 Pulse (bpm): 109 Weight (lbs): 208 Respiratory Rate (breaths/min): 18 Body Mass Index (BMI): 28.2 Blood Pressure (mmHg): 111/65 Reference Range: 80 - 120 mg / dl Electronic Signature(s) Signed: 03/23/2022 4:31:10 PM By: Adline Peals Entered By: Adline Peals on 03/23/2022 11:09:39

## 2022-03-30 ENCOUNTER — Encounter (HOSPITAL_BASED_OUTPATIENT_CLINIC_OR_DEPARTMENT_OTHER): Payer: Medicare Other | Admitting: General Surgery

## 2022-03-30 DIAGNOSIS — E1151 Type 2 diabetes mellitus with diabetic peripheral angiopathy without gangrene: Secondary | ICD-10-CM | POA: Diagnosis not present

## 2022-03-30 DIAGNOSIS — E1122 Type 2 diabetes mellitus with diabetic chronic kidney disease: Secondary | ICD-10-CM | POA: Diagnosis not present

## 2022-03-30 DIAGNOSIS — L97815 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: Secondary | ICD-10-CM | POA: Diagnosis not present

## 2022-03-30 DIAGNOSIS — I70239 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: Secondary | ICD-10-CM | POA: Diagnosis not present

## 2022-03-30 DIAGNOSIS — I129 Hypertensive chronic kidney disease with stage 1 through stage 4 chronic kidney disease, or unspecified chronic kidney disease: Secondary | ICD-10-CM | POA: Diagnosis not present

## 2022-03-30 DIAGNOSIS — L97812 Non-pressure chronic ulcer of other part of right lower leg with fat layer exposed: Secondary | ICD-10-CM | POA: Diagnosis not present

## 2022-03-30 DIAGNOSIS — L97122 Non-pressure chronic ulcer of left thigh with fat layer exposed: Secondary | ICD-10-CM | POA: Diagnosis not present

## 2022-03-30 DIAGNOSIS — E11622 Type 2 diabetes mellitus with other skin ulcer: Secondary | ICD-10-CM | POA: Diagnosis not present

## 2022-03-30 DIAGNOSIS — N189 Chronic kidney disease, unspecified: Secondary | ICD-10-CM | POA: Diagnosis not present

## 2022-03-30 DIAGNOSIS — E669 Obesity, unspecified: Secondary | ICD-10-CM | POA: Diagnosis not present

## 2022-03-30 NOTE — Progress Notes (Signed)
Michael [name], Michael [name] ([id number]) [id number] Page 1 of 10 Visit Report for 03/30/2022 Chief Complaint Document Details Patient Name: Date of Service: HO FFMA [name] [place], [name] 03/30/2022 9:15 A M Medical Record Number: [id number] Patient Account Number: [id number] Date of Birth/Sex: Treating RN: 1951/02/13 (72 y.o. M) Primary Care Provider: Karren [name] Other Clinician: Referring Provider: Treating Provider/Extender: Michael [name] in Treatment: 16 Information Obtained from: Patient Chief Complaint Patient presents to the wound care center today with an open arterial ulcer to the right lower extremity in the setting of diabetes mellitus. he has had this problem for 7 months 12/04/2021: ulcer to right lower anterior tibial surface Electronic Signature(s) Signed: 03/30/2022 10:01:21 AM By: Michael Maudlin MD FACS Entered By: Michael [name] on 03/30/2022 10:01:21 -------------------------------------------------------------------------------- Cellular or Tissue Based Product Details Patient Name: Date of Service: [name], [name] 03/30/2022 9:15 A M Medical Record Number: [id number] Patient Account Number: [id number] Date of Birth/Sex: Treating RN: 04-Feb-1951 (72 y.o. [name] Primary Care Provider: Karren [name] Other Clinician: Referring Provider: Treating Provider/Extender: Michael [name] in Treatment: 16 Cellular or Tissue Based Product Type Wound #3 Right,Anterior Lower Leg Applied to: Performed By: Physician Michael Maudlin, MD Cellular or Tissue Based Product Type: Theraskin Level of Consciousness (Pre-procedure): Awake and Alert Pre-procedure Verification/Time Out Yes - 09:53 Taken: Location: trunk / arms / legs Wound Size (sq cm): 5.75 Product Size (sq cm): 39 Waste Size (sq cm): 19 Waste Reason: size of wound Amount of Product Applied (sq cm): 20 Instrument Used: Curette Lot #:  [id number] Order #: 5 Expiration Date: 03/30/2022 Fenestrated: No Reconstituted: Yes Solution Type: normal saline Solution Amount: 30 Lot #: [id number] Solution Expiration Date: 07/30/2024 Secured: Yes Secured With: Steri-Strips Dressing Applied: Yes Primary Dressing: adaptec Michael [name], Michael [name] ([id number]) [id number] Page 2 of 10 Procedural Pain: 0 Post Procedural Pain: 0 Response to Treatment: Procedure was tolerated well Level of Consciousness (Post- Awake and Alert procedure): Post Procedure Diagnosis Same as Pre-procedure Notes Scribed for Dr. Celine [name] by Michael East, RN Electronic Signature(s) Signed: 03/30/2022 10:25:38 AM By: Michael East RN Signed: 03/30/2022 12:30:26 PM By: Michael Maudlin MD FACS Entered By: Michael [name] on 03/30/2022 10:25:37 -------------------------------------------------------------------------------- HPI Details Patient Name: Date of Service: [name], Michael LTER C. 03/30/2022 9:15 A M Medical Record Number: [id number] Patient Account Number: [id number] Date of Birth/Sex: Treating RN: [birth year] (72 y.o. M) Primary Care Provider: Karren [name] Other Clinician: Referring Provider: Treating Provider/Extender: Michael [name] in Treatment: 16 History of Present Illness Location: right lateral calf closer to the knee Quality: Patient reports experiencing a dull pain to affected area(s). Severity: Patient states wound(s) are getting worse. Duration: Patient has had the wound for > 7 months prior to seeking treatment at the wound center Timing: Pain in wound is constant (hurts all the time) Context: The wound would happen gradually Modifying Factors: Patient wound(s)/ulcer(s) are worsening due to : no resolution and a white material at the base of the wound ssociated Signs and Symptoms: Patient reports having:no discharge or purulent material A HPI Description: 72 year old gentleman who has been  referred to was from his PCP for a chronic ulceration on his right lower extremity which she's had for several weeks. Past medical history significant for diabetes mellitus type 2, hypertension, hyperlipidemia, anxiety, obesity, peripheral vascular disease and chronic kidney disease. He has never been a smoker. Most recent lab work done at his PCPs office showed a glucose of 217 milligrams per  deciliter which is consistent with hyperglycemia. In January 2017 recent hemoglobin A1c values noted to be 8.2%. In April 2017, he has been seen at the vascular office by Dr. Trula [name] and Dr. Kellie [name] for right leg claudication. He had a right superficial femoral artery stent in September 2012. Recent noninvasive vascular imaging done on 06/24/2015 showed a right ABI of 1.07 with a triphasic waveform and a right TBI of 0.81. The left was noncompressible with a triphasic waveform and a TBI of 1.17. Right lower extremity arterial duplex was unable to identify stent exit but they were elevated velocities at the stent and in the distal thigh with triphasic waveforms. Dr. Stephens [name] opinion was to return to the clinic in 6 months with ABI and right lower extremity arterial duplex studies to be done. He has seen a dermatologist who had done a biopsy and said it was benign and he was given a steroid cream. 10/23/2015 -- Pathology report of the biopsy done in April 2017 shows ulcer with underlying angiodermatitis consistent with stasis dermatitis. 10/30/2015 -- he is going for shoulder surgery in about 10 days' time and was asking about the perioperative care. His blood sugars are running in the high 100s or low 200s. No hemoglobin A1c done recently. 11/20/2015 -- is back up to 2 weeks because of recent left shoulder surgery. 12/04/15; patient returns today with the wound for the most part looking healthy. No evidence of infection no debridement required. He is using Prisma for 4 weeks, without obvious improvement per her  intake nurse. This started as several small open areas that were raised erythematous. He saw dermatology and at some point this was biopsied that just suggested stasis skin physiology. This certainly doesn't look like that 12/11/15; using Hydrafera Blue. Previous biopsy reviewed, no atypia PAS negative. He has a history of stents in the right leg however he does not appear to have primary arterial insufficiency ABI in this clinic was over 0.9. 12/25/2015 -- he has been approved for grafix and we will order for some to be applied next week 01/01/2016 -- he has had his first application of Grafix today 01/08/2016 -- he has had his second application of Grafix today READMISSION 12/04/2021 This is a now 72 year old man who was followed in our clinic about 5 years ago for an ulceration on his right lateral lower leg, just distal to the knee. He has since undergone a number of revascularization procedures that have been complicated by restenosis and wound infections. During the course of this treatment, he developed a small ulcer on his right anterior tibial surface. Despite use of an Unna boot, the wound has continued to expand. He was referred to the wound care center by Dr. Trula [name] for further evaluation and management. On the right anterior tibial surface, there is an irregular wound with heavy slough and eschar accumulation. The periwound skin is intact but he does have 1-2+ pitting edema. There is no purulent drainage or malodor. 10/13; second visit for this man who has an ischemic wound in the setting of type 2 diabetes on the right anterior mid tibia area. He has been revascularized. Using [address] Michael [name], Michael [name] ([id number]) [id number] Page 3 of 10 12/22/2021: The wound is about the same size this week. There is a lot of gray sloughy material on the surface, secondary to silver nitrate used to stop bleeding after what sounds like a fairly aggressive debridement  last week. 12/30/2021: The wound is smaller this week and a bit cleaner. Edema  control is excellent. There is still a bit of slough accumulation. 01/07/2022: The wound continues to contract and is much cleaner this week. Edema control is very good. He has small openings in his upper leg surgical scars, but he is going to be seeing Dr. Trula [name] next Monday and will have him take a look. He has been applying manuka honey to both of the sites. We have been using Santyl and gentamicin on his lower leg wound. 01/14/2022: He saw vascular surgery this week and was told that they were happy to have [place] manage the 2 wounds from his operation. Both of these have a light layer of slough on the surface. The more distal wound is a little bit dry. The anterior tibial wound continues to accumulate a fair amount of slough. Edema control is good. 01/19/2022: Both upper leg wounds are smaller. The more distal is quite dry and cracked when I was examining it. The lower leg wound is more painful and the surrounding tissue is erythematous and indurated. 01/26/2022: The upper leg wounds are healed. The skin at the distal leg wound is quite dry. The lower leg wound is cleaner but still fairly painful. His wrap slid again this week. 02/02/2022: We had good success keeping his wrap intact using a standard Unna boot. The lower leg wound continues to contract and is clean and less painful this week. 12/11; TheraSkin #1 12/18; TheraSkin #2. Wound looks as though it is contracted. Patient states that his pain was better in the leg. Secondary dressing and kerlix Coban 02/24/2022: The wound is smaller since I saw it last. It is less painful and there is good granulation tissue, including over the exposed tendon. 03/09/2022: The wound continues to contract. There is continued fill of the wound cavity with granulation tissue. 03/16/2022: The wound is smaller again today. There is good granulation tissue forming, covering the  tendon. 03/30/2022: The wound continues to contract and fill with good granulation tissue. The granulation tissue is oozing a little bit at the lateral edge. Electronic Signature(s) Signed: 03/30/2022 10:01:50 AM By: Michael Maudlin MD FACS Entered By: Michael [name] on 03/30/2022 10:01:50 -------------------------------------------------------------------------------- Physical Exam Details Patient Name: Date of Service: [name], Michael LTER C. 03/30/2022 9:15 A M Medical Record Number: [id number] Patient Account Number: [id number] Date of Birth/Sex: Treating RN: [birth year] (72 y.o. M) Primary Care Provider: Karren [name] Other Clinician: Referring Provider: Treating Provider/Extender: Michael [name] in Treatment: 16 Constitutional no acute distress. Respiratory Normal work of breathing on room air. Notes 03/30/2022: The wound continues to contract and fill with good granulation tissue. The granulation tissue is oozing a little bit at the lateral edge. Electronic Signature(s) Signed: 03/30/2022 10:13:27 AM By: Michael Maudlin MD FACS Previous Signature: 03/30/2022 10:04:26 AM Version By: Michael Maudlin MD FACS Entered By: Michael [name] on 03/30/2022 10:13:27 [name] ([id number]) [id number] Page 4 of 10 -------------------------------------------------------------------------------- Physician Orders Details Patient Name: Date of Service: [name], [name] 03/30/2022 9:15 A M Medical Record Number: [id number] Patient Account Number: [id number] Date of Birth/Sex: Treating RN: [birth year] (72 y.o. [name] Primary Care Provider: Karren [name] Other Clinician: Referring Provider: Treating Provider/Extender: Michael [name] in Treatment: [phone] Verbal / Phone Orders: No Diagnosis Coding ICD-10 Coding Code Description [phone] Non-pressure chronic ulcer of other part of right lower leg with  muscle involvement without evidence of necrosis I70.239 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 E11.622 Type 2 diabetes  mellitus with other skin ulcer I73.9 Peripheral vascular disease, unspecified Follow-up Appointments ppointment in 1 week. - Dr. Celine [name] - room 4 Return A Anesthetic Wound #3 Right,Anterior Lower Leg (In clinic) Topical Lidocaine 5% applied to wound bed Cellular or Tissue Based Products Cellular or Tissue Based Product Type: - Theraskin 03/16/22 #4, Therakin 03/30/22 #5 Cellular or Tissue Based Product applied to wound bed, secured with steri-strips, cover with Adaptic or Mepitel. (DO NOT REMOVE). Edema Control - Lymphedema / SCD / Other Right Lower Extremity Avoid standing for long periods of time. Wound Treatment Wound #3 - Lower Leg Wound Laterality: Right, Anterior Cleanser: Soap and Water 1 x Per Week/30 Days Discharge Instructions: May shower and wash wound with dial antibacterial soap and water prior to dressing change. Cleanser: Wound Cleanser 1 x Per Week/30 Days Discharge Instructions: Cleanse the wound with wound cleanser prior to applying a clean dressing using gauze sponges, not tissue or cotton balls. Peri-Wound Care: Sween Lotion (Moisturizing lotion) 1 x Per Week/30 Days Discharge Instructions: Apply moisturizing lotion as directed Prim Dressing: ADAPTIC TOUCH 3x4.25 (in/in) 1 x Per Week/30 Days ary Discharge Instructions: Apply to wound bed as instructed Prim Dressing: Endoform 2x2 in 1 x Per Week/30 Days ary Discharge Instructions: Moisten with saline Secondary Dressing: ABD Pad, 8x10 1 x Per Week/30 Days Discharge Instructions: Apply over primary dressing as directed. Secondary Dressing: Woven Gauze Sponge, Non-Sterile 4x4 in 1 x Per Week/30 Days Discharge Instructions: Apply over primary dressing as directed. Secured With: [employer] Size 5, 10 (yds) 1 x Per Week/30 Days Compression Wrap: Unnaboot  w/Calamine, 4x10 (in/yd) 1 x Per Week/30 Days Discharge Instructions: Apply Unnaboot to top of dressing and around ankles to prevent dressing from sliding Compression Wrap: Kerlix Roll 4.5x3.1 (in/yd) 1 x Per Week/30 Days Discharge Instructions: Apply Kerlix and Coban compression as directed. Compression Wrap: Coban Self-Adherent Wrap 4x5 (in/yd) 1 x Per Week/30 Days Discharge Instructions: Apply over Kerlix as directed. Electronic Signature(s) Signed: 03/30/2022 12:30:26 PM By: Michael Maudlin MD FACS Entered By: Michael [name] on 03/30/2022 10:13:41 [name] ([id number]) [id number] Page 5 of 10 -------------------------------------------------------------------------------- Problem List Details Patient Name: Date of Service: [name], [name] 03/30/2022 9:15 A M Medical Record Number: [id number] Patient Account Number: [id number] Date of Birth/Sex: Treating RN: Jan 01, 1951 (72 y.o. M) Primary Care Provider: Karren [name] Other Clinician: Referring Provider: Treating Provider/Extender: Michael [name] in Treatment: 16 Active Problems ICD-10 Encounter Code Description Active Date MDM Diagnosis L97.815 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 12/04/2021 No Yes involvement without evidence of necrosis I70.239 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 12/04/2021 No Yes E11.622 Type 2 diabetes mellitus with other skin ulcer 12/04/2021 No Yes I73.9 Peripheral vascular disease, unspecified 12/04/2021 No Yes Inactive Problems ICD-10 Code Description Active Date Inactive Date L97.122 Non-pressure chronic ulcer of left thigh with fat layer exposed 01/14/2022 01/14/2022 Resolved Problems Electronic Signature(s) Signed: 03/30/2022 10:01:04 AM By: Michael Maudlin MD FACS Entered By: Michael [name] on 03/30/2022  10:01:04 -------------------------------------------------------------------------------- Progress Note Details Patient Name: Date of Service: [name], Michael LTER C. 03/30/2022 9:15 A M Medical Record Number: [id number] Patient Account Number: [id number] Date of Birth/Sex: Treating RN: 1950-06-02 (72 y.o. M) Primary Care Provider: Karren [name] Other Clinician: Referring Provider: Treating Provider/Extender: Michael [name] in Treatment: [address] Subjective Chief Complaint Michael [name], Michael [name] ([id number]) [id number] Page 6 of 10 Information obtained from Patient Patient presents  to the wound care center today with an open arterial ulcer to the right lower extremity in the setting of diabetes mellitus. he has had this problem for 7 months 12/04/2021: ulcer to right lower anterior tibial surface History of Present Illness (HPI) The following HPI elements were documented for the patient's wound: Location: right lateral calf closer to the knee Quality: Patient reports experiencing a dull pain to affected area(s). Severity: Patient states wound(s) are getting worse. Duration: Patient has had the wound for > 7 months prior to seeking treatment at the wound center Timing: Pain in wound is constant (hurts all the time) Context: The wound would happen gradually Modifying Factors: Patient wound(s)/ulcer(s) are worsening due to : no resolution and a white material at the base of the wound Associated Signs and Symptoms: Patient reports having:no discharge or purulent material 72 year old gentleman who has been referred to was from his PCP for a chronic ulceration on his right lower extremity which she's had for several weeks. Past medical history significant for diabetes mellitus type 2, hypertension, hyperlipidemia, anxiety, obesity, peripheral vascular disease and chronic kidney disease. He has never been a smoker. Most recent lab work done at his PCPs  office showed a glucose of 217 milligrams per deciliter which is consistent with hyperglycemia. In January 2017 recent hemoglobin A1c values noted to be 8.2%. In April 2017, he has been seen at the vascular office by Dr. Trula [name] and Dr. Kellie [name] for right leg claudication. He had a right superficial femoral artery stent in September 2012. Recent noninvasive vascular imaging done on 06/24/2015 showed a right ABI of 1.07 with a triphasic waveform and a right TBI of 0.81. The left was noncompressible with a triphasic waveform and a TBI of 1.17. Right lower extremity arterial duplex was unable to identify stent exit but they were elevated velocities at the stent and in the distal thigh with triphasic waveforms. Dr. Stephens [name] opinion was to return to the clinic in 6 months with ABI and right lower extremity arterial duplex studies to be done. He has seen a dermatologist who had done a biopsy and said it was benign and he was given a steroid cream. 10/23/2015 -- Pathology report of the biopsy done in April 2017 shows ulcer with underlying angiodermatitis consistent with stasis dermatitis. 10/30/2015 -- he is going for shoulder surgery in about 10 days' time and was asking about the perioperative care. His blood sugars are running in the high 100s or low 200s. No hemoglobin A1c done recently. 11/20/2015 -- is back up to 2 weeks because of recent left shoulder surgery. 12/04/15; patient returns today with the wound for the most part looking healthy. No evidence of infection no debridement required. He is using Prisma for 4 weeks, without obvious improvement per her intake nurse. This started as several small open areas that were raised erythematous. He saw dermatology and at some point this was biopsied that just suggested stasis skin physiology. This certainly doesn't look like that 12/11/15; using Hydrafera Blue. Previous biopsy reviewed, no atypia PAS negative. He has a history of stents in the right leg  however he does not appear to have primary arterial insufficiency ABI in this clinic was over 0.9. 12/25/2015 -- he has been approved for grafix and we will order for some to be applied next week 01/01/2016 -- he has had his first application of Grafix today 01/08/2016 -- he has had his second application of Grafix today READMISSION 12/04/2021 This is a now 72 year old man who was followed  in our clinic about 5 years ago for an ulceration on his right lateral lower leg, just distal to the knee. He has since undergone a number of revascularization procedures that have been complicated by restenosis and wound infections. During the course of this treatment, he developed a small ulcer on his right anterior tibial surface. Despite use of an Unna boot, the wound has continued to expand. He was referred to the wound care center by Dr. Trula [name] for further evaluation and management. On the right anterior tibial surface, there is an irregular wound with heavy slough and eschar accumulation. The periwound skin is intact but he does have 1-2+ pitting edema. There is no purulent drainage or malodor. 10/13; second visit for this man who has an ischemic wound in the setting of type 2 diabetes on the right anterior mid tibia area. He has been revascularized. Using Santyl gent 12/22/2021: The wound is about the same size this week. There is a lot of gray sloughy material on the surface, secondary to silver nitrate used to stop bleeding after what sounds like a fairly aggressive debridement last week. 12/30/2021: The wound is smaller this week and a bit cleaner. Edema control is excellent. There is still a bit of slough accumulation. 01/07/2022: The wound continues to contract and is much cleaner this week. Edema control is very good. He has small openings in his upper leg surgical scars, but he is going to be seeing Dr. Trula [name] next Monday and will have him take a look. He has been applying manuka honey to both of  the sites. We have been using Santyl and gentamicin on his lower leg wound. 01/14/2022: He saw vascular surgery this week and was told that they were happy to have [place] manage the 2 wounds from his operation. Both of these have a light layer of slough on the surface. The more distal wound is a little bit dry. The anterior tibial wound continues to accumulate a fair amount of slough. Edema control is good. 01/19/2022: Both upper leg wounds are smaller. The more distal is quite dry and cracked when I was examining it. The lower leg wound is more painful and the surrounding tissue is erythematous and indurated. 01/26/2022: The upper leg wounds are healed. The skin at the distal leg wound is quite dry. The lower leg wound is cleaner but still fairly painful. His wrap slid again this week. 02/02/2022: We had good success keeping his wrap intact using a standard Unna boot. The lower leg wound continues to contract and is clean and less painful this week. 12/11; TheraSkin #1 12/18; TheraSkin #2. Wound looks as though it is contracted. Patient states that his pain was better in the leg. Secondary dressing and kerlix Coban 02/24/2022: The wound is smaller since I saw it last. It is less painful and there is good granulation tissue, including over the exposed tendon. 03/09/2022: The wound continues to contract. There is continued fill of the wound cavity with granulation tissue. 03/16/2022: The wound is smaller again today. There is good granulation tissue forming, covering the tendon. 03/30/2022: The wound continues to contract and fill with good granulation tissue. The granulation tissue is oozing a little bit at the lateral edge. BOWEN, KIA ([id number]) [id number] Page 7 of 10 Patient History Information obtained from Patient. Family History Cancer - Siblings, Heart Disease - Father, Hypertension - Father,Mother, No family history of Hereditary Spherocytosis, Kidney  Disease, Seizures, Stroke, Thyroid Problems, Tuberculosis. Social History Former smoker - smokeless tobacco, Marital  Status - Married, Alcohol Use - Rarely - WINE, Drug Use - No History, Caffeine Use - Daily - COFFEE. Medical History Ear/Nose/Mouth/Throat Patient has history of Chronic sinus problems/congestion - seasonal allergies Cardiovascular Patient has history of Hypertension, Peripheral Arterial Disease - STENTS, Peripheral Venous Disease Endocrine Patient has history of Type II Diabetes - last A1c- 8.2 Hospitalization/Surgery History - left shoulder surgery. Medical A Surgical History Notes nd Constitutional Symptoms (General Health) obesity , h/o (R) leg claudication (right fem stent September 2012) Cardiovascular hyperlipidemia Endocrine pt. on diet intentionally losing 20 pounds since December 2016 in an effort to improve A1C levels Integumentary (Skin) psoriatic arthritis Musculoskeletal bilat knee pain identified as an ortho issue psoriatic arthritis Oncologic Prostate cancer 2018 Psychiatric anxiety Objective Constitutional no acute distress. Vitals Time Taken: 9:30 AM, Height: 72 in, Weight: 208 lbs, BMI: 28.2, Capillary Blood Glucose: 97 mg/dl. Respiratory Normal work of breathing on room air. General Notes: 03/30/2022: The wound continues to contract and fill with good granulation tissue. The granulation tissue is oozing a little bit at the lateral edge. Integumentary (Hair, Skin) Wound #3 status is Open. Original cause of wound was Blister. The date acquired was: 09/30/2021. The wound has been in treatment 16 weeks. The wound is located on the Right,Anterior Lower Leg. The wound measures 2.5cm length x 2.3cm width x 0.1cm depth; 4.516cm^2 area and 0.452cm^3 volume. There is no tunneling or undermining noted. There is a medium amount of serosanguineous drainage noted. There is large (67-100%) granulation within the wound bed. There is a small (1-33%) amount of  necrotic tissue within the wound bed including Adherent Slough. The periwound skin appearance exhibited: Scarring, Dry/Scaly, Rubor. Periwound temperature was noted as No Abnormality. 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 A skin graft procedure using a bioengineered skin substitute/cellular or tissue based product was performed by Michael Maudlin, MD with the following instrument(s): Curette. Theraskin was applied and secured with Steri-Strips. 20 sq cm of product was utilized and 19 sq cm was wasted due to size of wound. Post Application, adaptec was applied. A Time Out was conducted at 09:53, prior to the start of the procedure. The procedure was tolerated well with a pain level of 0 throughout and a pain level of 0 following the procedure. Post procedure Diagnosis Wound #3: Same as Pre-Procedure General Notes: Scribed for Dr. Celine [name] by Michael East, RN. Pre-procedure diagnosis of Wound #3 is a Diabetic Wound/Ulcer of the Lower Extremity located on the Right,Anterior Lower Leg . An Chemical Cauterization procedure was performed by Michael Maudlin, MD. Post procedure Diagnosis Wound #3: Same as Pre-Procedure Notes: Scribed for Dr. Celine [name] by Michael East, RN Plan Follow-up Appointments: Return Appointment in 1 week. - Dr. Celine [name] - room 4 Anesthetic: Wound #3 Right,Anterior Lower Leg: (In clinic) Topical Lidocaine 5% applied to wound bed Cellular or Tissue Based Products: Cellular or  Tissue Based Product Type: - Theraskin 03/16/22 #4, Therakin 03/30/22 #5 Cellular or Tissue Based Product applied to wound bed, secured with steri-strips, cover with Adaptic or Mepitel. (DO NOT REMOVE). Edema Control - Lymphedema / SCD / Other: Avoid  standing for long periods of time. WOUND #3: - Lower Leg Wound Laterality: Right, Anterior Cleanser: Soap and Water 1 x Per Week/30 Days Discharge Instructions: May shower and wash wound with dial antibacterial soap and water prior to dressing change. Cleanser: Wound Cleanser 1 x Per Week/30 Days Discharge Instructions: Cleanse the wound with wound cleanser prior to applying a clean dressing using gauze sponges, not tissue or cotton balls. Peri-Wound Care: Sween Lotion (Moisturizing lotion) 1 x Per Week/30 Days Discharge Instructions: Apply moisturizing lotion as directed Prim Dressing: ADAPTIC TOUCH 3x4.25 (in/in) 1 x Per Week/30 Days ary Discharge Instructions: Apply to wound bed as instructed Prim Dressing: Endoform 2x2 in 1 x Per Week/30 Days ary Discharge Instructions: Moisten with saline Secondary Dressing: ABD Pad, 8x10 1 x Per Week/30 Days Discharge Instructions: Apply over primary dressing as directed. Secondary Dressing: Woven Gauze Sponge, Non-Sterile 4x4 in 1 x Per Week/30 Days Discharge Instructions: Apply over primary dressing as directed. Secured With: [employer] Size 5, 10 (yds) 1 x Per Week/30 Days Com pression Wrap: Unnaboot w/Calamine, 4x10 (in/yd) 1 x Per Week/30 Days Discharge Instructions: Apply Unnaboot to top of dressing and around ankles to prevent dressing from sliding Com pression Wrap: Kerlix Roll 4.5x3.1 (in/yd) 1 x Per Week/30 Days Discharge Instructions: Apply Kerlix and Coban compression as directed. Com pression Wrap: Coban Self-Adherent Wrap 4x5 (in/yd) 1 x Per Week/30 Days Discharge Instructions: Apply over Kerlix as directed. 03/30/2022: The wound continues to contract and fill with good granulation tissue.  The granulation tissue is oozing a little bit at the lateral edge. No debridement was necessary today. I chemically cauterized the oozing granulation tissue with silver nitrate. TheraSkin was prepared and applied to the wound in standard fashion. The excess was trimmed. It was secured in place with Adaptic and Steri-Strips. A gauze sponge was used as a bolster. Unna boot first layer was applied at the top of his wrap to minimize slippage and then Kerlix and Coban were applied for compression. He will have a nurse visit next week and follow-up with me in 2 weeks. He is making excellent progress and I anticipate that he will be closed in 4 to 6 weeks. Electronic Signature(s) Signed: 03/30/2022 10:15:05 AM By: Michael Maudlin MD FACS Entered By: Michael [name] on 03/30/2022 10:15:05 -------------------------------------------------------------------------------- HxROS Details Patient Name: Date of Service: [name], Michael LTER C. 03/30/2022 9:15 A M Medical Record Number: [id number] Patient Account Number: [id number] Date of Birth/Sex: Treating RN: 16-Jul-1950 (71 y.o. M) Primary Care Provider: Karren [name] Other Clinician: Referring Provider: Treating Provider/Extender: Michael [name] in Treatment: [address], [place] ([id number]) [id number] Page 9 of 10 Information Obtained From Patient Constitutional Symptoms (General Health) Medical History: Past Medical History Notes: obesity , h/o (R) leg claudication (right fem stent September 2012) Ear/Nose/Mouth/Throat Medical History: Positive for: Chronic sinus problems/congestion - seasonal allergies Cardiovascular Medical History: Positive for: Hypertension; Peripheral Arterial Disease - STENTS; Peripheral Venous Disease Past Medical History Notes: hyperlipidemia Endocrine Medical History: Positive for: Type II Diabetes - last A1c- 8.2 Past Medical History Notes: pt. on diet  intentionally losing 20 pounds since December 2016 in an effort to improve A1C levels Time with diabetes: 9 YRS Treated with: Insulin Blood sugar tested every day: Yes Tested : 7-8 TIMES Integumentary (Skin) Medical History: Past Medical History Notes: psoriatic arthritis Musculoskeletal Medical History: Past Medical History Notes: bilat knee pain identified as an ortho issue psoriatic arthritis Oncologic Medical History: Past Medical History Notes: Prostate cancer 2018  Psychiatric Medical History: Past Medical History Notes: anxiety HBO Extended History Items Ear/Nose/Mouth/Throat: Chronic sinus problems/congestion Immunizations Pneumococcal Vaccine: Received Pneumococcal Vaccination: Yes Received Pneumococcal Vaccination On or After 60th Birthday: Yes Implantable Devices None Hospitalization / Surgery History Type of Hospitalization/Surgery left shoulder surgery Family and Social History Cancer: Yes - Siblings; Heart Disease: Yes - Father; Hereditary Spherocytosis: No; Hypertension: Yes - Father,Mother; Kidney Disease: No; Seizures: No; Stroke: No; Thyroid Problems: No; Tuberculosis: No; Former smoker - smokeless tobacco; Marital Status - Married; Alcohol Use: Rarely - WINE; Drug Use: No History; Caffeine Use: Daily - COFFEE; Financial Concerns: No; Food, Clothing or Shelter Needs: No; Support System Lacking: No; Transportation Michael [name], Michael [name] ([id number]) [id number] Page 10 of 10 Concerns: No Electronic Signature(s) Signed: 03/30/2022 12:30:26 PM By: Michael Maudlin MD FACS Entered By: Michael [name] on 03/30/2022 10:01:56 -------------------------------------------------------------------------------- SuperBill Details Patient Name: Date of Service: [name], Michael LTER C. 03/30/2022 Medical Record Number: [id number] Patient Account Number: [id number] Date of Birth/Sex: Treating RN: 1950/12/21 (72 y.o. 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 E11.622 Type 2 diabetes mellitus with other skin ulcer I73.9 Peripheral vascular disease, unspecified Facility Procedures : CPT4 Code Description: [id number] - SKIN SUB GRAFT TRNK/ARM/LEG ICD-10 Diagnosis Description L97.815 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 involvemen I70.239 Atherosclerosis of native arteries of right leg with  ulceration of unspecified sit Modifier: t without evidence e Quantity: 1 of necrosis : CPT4 Code Description: [id number] Q4121- Theraskin per 1sq cm Large -39 sq cm ICD-10 Diagnosis Description L97.815 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 involvemen Modifier: t without evidence Quantity: 39 of necrosis Physician Procedures : CPT4 Code Description Modifier [id number] 20254 - WC PHYS LEVEL 3 - EST PT ICD-10 Diagnosis Description L97.815 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 without evidence o I70.239 Atherosclerosis of native  arteries of right leg with ulceration of unspecified site E11.622 Type 2 diabetes mellitus with other skin ulcer I73.9 Peripheral vascular disease, unspecified Quantity: 1 f necrosis : [id number] - WC PHYS SKIN SUB GRAFT TRNK/ARM/LEG ICD-10 Diagnosis Description L97.815 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 without evidence o I70.239 Atherosclerosis of native arteries of right leg with  ulceration of unspecified site Quantity: 1 f necrosis Electronic Signature(s) Signed: 03/30/2022 10:26:14 AM By:  Michael East RN Signed: 03/30/2022 12:30:26 PM By: Michael Maudlin MD FACS Previous Signature: 03/30/2022 10:23:11 AM Version By: Michael East RN Previous Signature: 03/30/2022 10:23:03 AM Version By: Michael East RN Previous Signature: 03/30/2022 10:22:31 AM Version By: Michael East RN Previous Signature: 03/30/2022 10:15:33 AM Version By: Michael Maudlin MD FACS Entered By: Michael [name] on 03/30/2022 10:26:14

## 2022-03-30 NOTE — Progress Notes (Signed)
Michael [name], Michael [name] ([id number]) [id number] Page 1 of 8 Visit Report for 03/30/2022 Arrival Information Details Patient Name: Date of Service: HO FFMA [place], [name] 03/30/2022 9:15 A M Medical Record Number: [id number] Patient Account Number: [id number] Date of Birth/Sex: Treating RN: [birth year] (72 y.o. Michael [name] Primary Care Michael [name]: Michael [name] Other Clinician: Referring Michael [name]: Treating Michael [name]/Extender: Michael [name] in Treatment: 16 Visit Information History Since Last Visit Added or deleted any medications: No Patient Arrived: Ambulatory Any new allergies or adverse reactions: No Arrival Time: 09:30 Had a fall or experienced change in No Accompanied By: wife activities of daily living that may affect Transfer Assistance: None risk of falls: Patient Requires Transmission-Based Precautions: No Signs or symptoms of abuse/neglect since last visito No Patient Has Alerts: Yes Hospitalized since last visit: No Patient Alerts: Patient on Blood Thinner Implantable device outside of the clinic excluding No plavix cellular tissue based products placed in the center since last visit: Has Compression in Place as Prescribed: Yes Pain Present Now: Yes Electronic Signature(s) Signed: 03/30/2022 4:55:35 PM By: Michael East RN Entered By: Michael [name] on 03/30/2022 09:31:03 -------------------------------------------------------------------------------- Encounter Discharge Information Details Patient Name: Date of Service: [name], [name] 03/30/2022 9:15 A M Medical Record Number: [id number] Patient Account Number: [id number] Date of Birth/Sex: Treating RN: 05-24-1950 (72 y.o. Michael [name] Primary Care Michael [name]: Michael [name] Other Clinician: Referring Zakariah Urwin: Treating Linsy Ehresman/Extender: Michael [name] in Treatment: 16 Encounter Discharge Information Items Post Procedure Vitals Discharge  Condition: Stable Temperature (F): 98.5 Ambulatory Status: Ambulatory Pulse (bpm): 86 Discharge Destination: Home Respiratory Rate (breaths/min): 18 Transportation: Private Auto Blood Pressure (mmHg): 108/71 Accompanied By: wife Schedule Follow-up Appointment: Yes Clinical Summary of Care: Electronic Signature(s) Signed: 03/30/2022 4:55:35 PM By: Michael East RN Entered By: Michael [name] on 03/30/2022 09:57:26 Michael [name], Michael [name] ([id number]) [id number] Page 2 of 8 -------------------------------------------------------------------------------- Lower Extremity Assessment Details Patient Name: Date of Service: [name], [name] 03/30/2022 9:15 A M Medical Record Number: [id number] Patient Account Number: [id number] Date of Birth/Sex: Treating RN: [birth year] (72 y.o. Michael [name] Primary Care Michael [name]: Michael [name] Other Clinician: Referring Lucindy Borel: Treating Danne Scardina/Extender: Michael [name] in Treatment: 16 Edema Assessment Assessed: Michael [name]: No] Michael [name]: No] Edema: [Left: Ye] [Right: s] Calf Left: Right: Point of Measurement: 35 cm From Medial Instep 32.4 cm Ankle Left: Right: Point of Measurement: 12 cm From Medial Instep 22 cm Vascular Assessment Pulses: Dorsalis Pedis Palpable: [Right:Yes] Electronic Signature(s) Signed: 03/30/2022 4:55:35 PM By: Michael East RN Entered By: Michael [name] on 03/30/2022 09:36:50 -------------------------------------------------------------------------------- Multi Wound Chart Details Patient Name: Date of Service: [name], [name] 03/30/2022 9:15 A M Medical Record Number: [id number] Patient Account Number: [id number] Date of Birth/Sex: Treating RN: May 23, 1950 (72 y.o. M) Primary Care Michael [name]: Michael [name] Other Clinician: Referring Michael [name]: Treating Michael [name]/Extender: Michael [name] in Treatment: 16 Vital Signs Height(in): 72 Capillary Blood  Glucose(mg/dl): 97 Weight(lbs): 208 Pulse(bpm): Body Mass Index(BMI): 28.2 Blood Pressure(mmHg): Temperature(F): Respiratory Rate(breaths/min): [3:Photos:] [N/A:N/A] Right, Anterior Lower Leg N/A N/A Wound Location: Blister N/A N/A Wounding Event: Diabetic Wound/Ulcer of the Lower N/A N/A Primary Etiology: Extremity Chronic sinus problems/congestion, N/A N/A Comorbid History: Hypertension, Peripheral Arterial Disease, Peripheral Venous Disease, Type II Diabetes 09/30/2021 N/A N/A Date Acquired: 16 N/A N/A Weeks of Treatment: Open N/A N/A Wound Status: No N/A N/A Wound Recurrence: 2.5x2.3x0.1 N/A N/A Measurements L x W x D (cm) 4.516 N/A N/A A (cm) :  rea 0.452 N/A N/A Volume (cm) : 74.30% N/A N/A % Reduction in A rea: 91.40% N/A N/A % Reduction in Volume: Grade 2 N/A N/A Classification: Medium N/A N/A Exudate A mount: Serosanguineous N/A N/A Exudate Type: red, brown N/A N/A Exudate Color: Large (67-100%) N/A N/A Granulation A mount: Small (1-33%) N/A N/A Necrotic A mount: Scarring: Yes N/A N/A Periwound Skin Texture: Dry/Scaly: Yes N/A N/A Periwound Skin Moisture: Rubor: Yes N/A N/A Periwound Skin Color: No Abnormality N/A N/A Temperature: Cellular or Tissue Based Product N/A N/A Procedures Performed: Chemical Cauterization Treatment Notes Wound #3 (Lower Leg) Wound Laterality: Right, Anterior Cleanser Soap and Water Discharge Instruction: May shower and wash wound with dial antibacterial soap and water prior to dressing change. Wound Cleanser Discharge Instruction: Cleanse the wound with wound cleanser prior to applying a clean dressing using gauze sponges, not tissue or cotton balls. Peri-Wound Care Sween Lotion (Moisturizing lotion) Discharge Instruction: Apply moisturizing lotion as directed Topical Primary Dressing ADAPTIC TOUCH 3x4.25 (in/in) Discharge Instruction: Apply to wound bed as instructed Endoform 2x2 in Discharge Instruction:  Moisten with saline Secondary Dressing ABD Pad, 8x10 Discharge Instruction: Apply over primary dressing as directed. Woven Gauze Sponge, Non-Sterile 4x4 in Discharge Instruction: Apply over primary dressing as directed. Secured With [employer] Size 5, 10 (yds) Compression Wrap Unnaboot w/Calamine, 4x10 (in/yd) Discharge Instruction: Apply Unnaboot to top of dressing and around ankles to prevent dressing from sliding Kerlix Roll 4.5x3.1 (in/yd) Discharge Instruction: Apply Kerlix and Coban compression as directed. Coban Self-Adherent Wrap 4x5 (in/yd) Discharge Instruction: Apply over Kerlix as directed. Compression Stockings Add-Ons Electronic Signature(s) Signed: 03/30/2022 10:01:13 AM By: [name] Michael [name], Michael [name] [phone] By: [name] [phone].pdf Page 4 of 8 Signed: 03/30/2022 10:01:13 Entered By: Fredirick Maudlin on 03/30/2022 10:01:13 -------------------------------------------------------------------------------- Multi-Disciplinary Care Plan Details Patient Name: Date of Service: HO FFMA [place], [name] 03/30/2022 9:15 A M Medical Record Number: [id number] Patient Account Number: [id number] Date of Birth/Sex: Treating RN: 07-22-1950 (72 y.o. Michael [name] Primary Care Evelyn Aguinaldo: Michael [name] Other Clinician: Referring Diksha Tagliaferro: Treating Quindarius Cabello/Extender: Michael [name] in Treatment: 16 Active Inactive Nutrition Nursing Diagnoses: Potential for alteratiion in Nutrition/Potential for imbalanced nutrition Goals: Patient/caregiver agrees to and verbalizes understanding of need to use nutritional supplements and/or vitamins as prescribed Date Initiated: 12/04/2021 Target Resolution Date: 04/30/2022 Goal Status: Active Patient/caregiver verbalizes understanding of need to maintain therapeutic glucose control per primary care physician Date Initiated: 12/04/2021 Target Resolution Date:  04/30/2022 Goal Status: Active Interventions: Assess patient nutrition upon admission and as needed per policy Provide education on elevated blood sugars and impact on wound healing Provide education on nutrition Treatment Activities: Education provided on Nutrition : 01/26/2022 Notes: Wound/Skin Impairment Nursing Diagnoses: Impaired tissue integrity Knowledge deficit related to ulceration/compromised skin integrity Goals: Patient/caregiver will verbalize understanding of skin care regimen Date Initiated: 12/04/2021 Date Inactivated: 02/02/2022 Target Resolution Date: 01/28/2022 Goal Status: Met Ulcer/skin breakdown will have a volume reduction of 30% by week 4 Date Initiated: 12/04/2021 Date Inactivated: 01/07/2022 Target Resolution Date: 12/26/2021 Goal Status: Met Ulcer/skin breakdown will have a volume reduction of 50% by week 8 Date Initiated: 01/07/2022 Date Inactivated: 03/16/2022 Target Resolution Date: 04/01/2022 Goal Status: Met Ulcer/skin breakdown will have a volume reduction of 80% by week 12 Date Initiated: 03/16/2022 Target Resolution Date: 04/30/2022 Goal Status: Active Interventions: Assess ulceration(s) every visit Provide education on ulcer and skin care Notes: Electronic Signature(s) Signed: 03/30/2022 4:55:35 PM By: Michael East RN Entered By: Michael [name]  on 03/30/2022 09:52:03 Michael [name], Michael [name] ([id number]) [phone].pdf Page 5 of 8 -------------------------------------------------------------------------------- Pain Assessment Details Patient Name: Date of Service: [name], [name] 03/30/2022 9:15 A M Medical Record Number: [id number] Patient Account Number: [id number] Date of Birth/Sex: Treating RN: [birth year] (72 y.o. Michael [name] Primary Care Danene Montijo: Michael [name] Other Clinician: Referring Darielle Hancher: Treating Etheline Geppert/Extender: Michael [name] in Treatment: 16 Active Problems Location of  Pain Severity and Description of Pain Patient Has Paino No Site Locations Rate the pain. Current Pain Level: 0 Pain Management and Medication Current Pain Management: Electronic Signature(s) Signed: 03/30/2022 4:55:35 PM By: Michael East RN Entered By: Michael [name] on 03/30/2022 09:31:40 -------------------------------------------------------------------------------- Patient/Caregiver Education Details Patient Name: Date of Service: [name], [name] 1/29/2024andnbsp9:15 [place] Record Number: [id number] Patient Account Number: [id number] Date of Birth/Gender: Treating RN: October 15, 1950 (72 y.o. Michael [name] Primary Care Physician: Michael [name] Other Clinician: Referring Physician: Treating Physician/Extender: Michael [name] in Treatment: 16 Education Assessment Education Provided To: Patient Education Topics Provided Wound Debridement: Methods: Explain/Verbal Michael [name], Michael [name] ([id number]) [phone].pdf Page 6 of 8 Responses: Reinforcements needed, State content correctly Wound/Skin Impairment: Methods: Explain/Verbal Responses: Reinforcements needed, State content correctly Electronic Signature(s) Signed: 03/30/2022 4:55:35 PM By: Michael East RN Entered By: Michael [name] on 03/30/2022 09:52:21 -------------------------------------------------------------------------------- Wound Assessment Details Patient Name: Date of Service: [name], [name] 03/30/2022 9:15 A M Medical Record Number: [id number] Patient Account Number: [id number] Date of Birth/Sex: Treating RN: 01-23-1951 (72 y.o. Michael [name] Primary Care Winter Trefz: Michael [name] Other Clinician: Referring Thatiana Renbarger: Treating Veldon Wager/Extender: Michael [name] in Treatment: 16 Wound Status Wound Number: 3 Primary Diabetic Wound/Ulcer of the Lower Extremity Etiology: Wound Location: Right, Anterior Lower Leg Wound Open Wounding  Event: Blister Status: Date Acquired: 09/30/2021 Comorbid Chronic sinus problems/congestion, Hypertension, Peripheral Weeks Of Treatment: 16 History: Arterial Disease, Peripheral Venous Disease, Type II Diabetes Clustered Wound: No Photos Wound Measurements Length: (cm) 2.5 Width: (cm) 2.3 Depth: (cm) 0.1 Area: (cm) 4.516 Volume: (cm) 0.452 % Reduction in Area: 74.3% % Reduction in Volume: 91.4% Tunneling: No Undermining: No Wound Description Classification: Grade 2 Exudate Amount: Medium Exudate Type: Serosanguineous Exudate Color: red, brown Wound Bed Granulation Amount: Large (67-100%) Necrotic Amount: Small (1-33%) Necrotic Quality: Adherent Slough Periwound Skin Texture Texture Color No Abnormalities Noted: No No Abnormalities Noted: No Scarring: Yes Rubor: Yes Moisture Temperature / Pain Michael [name], Michael [name] ([id number]) [id number].pdf Page 7 of 8 No Abnormalities Noted: No Temperature: No Abnormality Dry / Scaly: Yes Treatment Notes Wound #3 (Lower Leg) Wound Laterality: Right, Anterior Cleanser Soap and Water Discharge Instruction: May shower and wash wound with dial antibacterial soap and water prior to dressing change. Wound Cleanser Discharge Instruction: Cleanse the wound with wound cleanser prior to applying a clean dressing using gauze sponges, not tissue or cotton balls. Peri-Wound Care Sween Lotion (Moisturizing lotion) Discharge Instruction: Apply moisturizing lotion as directed Topical Primary Dressing ADAPTIC TOUCH 3x4.25 (in/in) Discharge Instruction: Apply to wound bed as instructed Endoform 2x2 in Discharge Instruction: Moisten with saline Secondary Dressing ABD Pad, 8x10 Discharge Instruction: Apply over primary dressing as directed. Woven Gauze Sponge, Non-Sterile 4x4 in Discharge Instruction: Apply over primary dressing as directed. Secured With [employer] Size 5, 10 (yds) Compression Wrap Unnaboot w/Calamine,  4x10 (in/yd) Discharge Instruction: Apply Unnaboot to top of dressing and around ankles to prevent dressing from sliding Kerlix Roll 4.5x3.1 (in/yd) Discharge Instruction: Apply Kerlix and Coban compression as directed.  Coban Self-Adherent Wrap 4x5 (in/yd) Discharge Instruction: Apply over Kerlix as directed. Compression Stockings Add-Ons Electronic Signature(s) Signed: 03/30/2022 4:55:35 PM By: Michael East RN Entered By: Michael [name] on 03/30/2022 09:41:27 -------------------------------------------------------------------------------- Vitals Details Patient Name: Date of Service: [name], [name] 03/30/2022 9:15 A M Medical Record Number: [id number] Patient Account Number: [id number] Date of Birth/Sex: Treating RN: [birth year] (72 y.o. Michael [name] Primary Care Elfego Giammarino: Michael [name] Other Clinician: Referring Mysty Kielty: Treating Spirit Wernli/Extender: Michael [name] in Treatment: 16 Vital Signs Time Taken: 09:30 Capillary Blood Glucose (mg/dl): 97 Height (in): 72 Reference Range: 80 - 120 mg / dl Weight (lbs): 208 Body Mass Index (BMI): 28.2 Michael [name], Michael [name] ([id number]) [phone].pdf Page 8 of 8 Electronic Signature(s) Signed: 03/30/2022 4:55:35 PM By: Michael East RN Entered By: Michael [name] on 03/30/2022 09:31:26

## 2022-04-06 ENCOUNTER — Encounter (HOSPITAL_BASED_OUTPATIENT_CLINIC_OR_DEPARTMENT_OTHER): Payer: Medicare Other | Attending: General Surgery | Admitting: General Surgery

## 2022-04-06 DIAGNOSIS — L97122 Non-pressure chronic ulcer of left thigh with fat layer exposed: Secondary | ICD-10-CM | POA: Insufficient documentation

## 2022-04-06 DIAGNOSIS — E669 Obesity, unspecified: Secondary | ICD-10-CM | POA: Diagnosis not present

## 2022-04-06 DIAGNOSIS — E1122 Type 2 diabetes mellitus with diabetic chronic kidney disease: Secondary | ICD-10-CM | POA: Diagnosis not present

## 2022-04-06 DIAGNOSIS — E785 Hyperlipidemia, unspecified: Secondary | ICD-10-CM | POA: Diagnosis not present

## 2022-04-06 DIAGNOSIS — E1151 Type 2 diabetes mellitus with diabetic peripheral angiopathy without gangrene: Secondary | ICD-10-CM | POA: Diagnosis not present

## 2022-04-06 DIAGNOSIS — L97815 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: Secondary | ICD-10-CM | POA: Diagnosis not present

## 2022-04-06 DIAGNOSIS — N189 Chronic kidney disease, unspecified: Secondary | ICD-10-CM | POA: Diagnosis not present

## 2022-04-06 DIAGNOSIS — E11622 Type 2 diabetes mellitus with other skin ulcer: Secondary | ICD-10-CM | POA: Diagnosis not present

## 2022-04-06 DIAGNOSIS — I129 Hypertensive chronic kidney disease with stage 1 through stage 4 chronic kidney disease, or unspecified chronic kidney disease: Secondary | ICD-10-CM | POA: Insufficient documentation

## 2022-04-06 DIAGNOSIS — I70239 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: Secondary | ICD-10-CM | POA: Diagnosis not present

## 2022-04-06 NOTE — Progress Notes (Signed)
CHUCK, CABAN ([id number]) [id number] Page 1 of 1 Visit Report for 04/06/2022 SuperBill Details Patient Name: Date of Service: [name] 04/06/2022 Medical Record Number: [id number] Patient Account Number: [id number] Date of Birth/Sex: Treating RN: 22-Jun-1950 (72 y.o. 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

## 2022-04-06 NOTE — Progress Notes (Signed)
Michael [name], Michael [name] ([id number]) [id number] Page 1 of 5 Visit Report for 04/06/2022 Arrival Information Details Patient Name: Date of Service: Michael [name], [name] 04/06/2022 9:15 A M Medical Record Number: [id number] Patient Account Number: [id number] Date of Birth/Sex: Treating RN: [birth year], Michael [name] (72 y.o. Michael [name] Primary Care Kayona Foor: [name] Other Clinician: Referring Glendell Fouse: Treating Allona Gondek/Extender: [name] in Treatment: 26 Visit Information History Since Last Visit Added or deleted any medications: No Patient Arrived: Ambulatory Any new allergies or adverse reactions: No Arrival Time: 09:50 Had a fall or experienced change in No Accompanied By: spouse activities of daily living that Michael affect Transfer Assistance: None risk of falls: Patient Identification Verified: Yes Signs or symptoms of abuse/neglect since last visito No Secondary Verification Process Completed: Yes Hospitalized since last visit: No Patient Requires Transmission-Based Precautions: No Implantable device outside of the clinic excluding No Patient Has Alerts: Yes cellular tissue based products placed in the center Patient Alerts: Patient on Blood Thinner since last visit: plavix Has Compression in Place as Prescribed: Yes Pain Present Now: No Electronic Signature(s) Signed: 04/06/2022 9:51:10 AM By: [name] Entered By: Blanche East on 04/06/2022 09:51:10 -------------------------------------------------------------------------------- Clinic Level of Care Assessment Details Patient Name: Date of Service: Michael Michael [name], [name] 04/06/2022 9:15 A M Medical Record Number: [id number] Patient Account Number: [id number] Date of Birth/Sex: Treating RN: [birth year]Michael [name] (72 y.o. Michael [name] Primary Care Tiah Heckel: [name] Other Clinician: Referring Lorren Rossetti: Treating Korene Dula/Extender: [name] in  Treatment: 17 Clinic Level of Care Assessment Items TOOL 4 Quantity Score X- 1 0 Use when only an EandM is performed on FOLLOW-UP visit ASSESSMENTS - Nursing Assessment / Reassessment X- 1 10 Reassessment of Co-morbidities (includes updates in patient status) X- 1 5 Reassessment of Adherence to Treatment Plan ASSESSMENTS - Wound and Skin A ssessment / Reassessment X - Simple Wound Assessment / Reassessment - one wound 1 5 '[]'$  - 0 Complex Wound Assessment / Reassessment - multiple wounds '[]'$  - 0 Dermatologic / Skin Assessment (not related to wound area) ASSESSMENTS - Focused Assessment '[]'$  - 0 Circumferential Edema Measurements - multi extremities '[]'$  - 0 Nutritional Assessment / Counseling / Intervention MASAICHI, Michael [name] ([id number]) [id number].pdf Page 2 of 5 '[]'$  - 0 Lower Extremity Assessment (monofilament, tuning fork, pulses) '[]'$  - 0 Peripheral Arterial Disease Assessment (using hand held doppler) ASSESSMENTS - Ostomy and/or Continence Assessment and Care '[]'$  - 0 Incontinence Assessment and Management '[]'$  - 0 Ostomy Care Assessment and Management (repouching, etc.) PROCESS - Coordination of Care X - Simple Patient / Family Education for ongoing care 1 15 '[]'$  - 0 Complex (extensive) Patient / Family Education for ongoing care '[]'$  - 0 Staff obtains Programmer, systems, Records, T Results / Process Orders est '[]'$  - 0 Staff telephones HHA, Nursing Homes / Clarify orders / etc '[]'$  - 0 Routine Transfer to another Facility (non-emergent condition) '[]'$  - 0 Routine Hospital Admission (non-emergent condition) '[]'$  - 0 New Admissions / Biomedical engineer / Ordering NPWT Apligraf, etc. , '[]'$  - 0 Emergency Hospital Admission (emergent condition) '[]'$  - 0 Simple Discharge Coordination '[]'$  - 0 Complex (extensive) Discharge Coordination PROCESS - Special Needs '[]'$  - 0 Pediatric / Minor Patient Management '[]'$  - 0 Isolation Patient Management '[]'$  - 0 Hearing / Language /  Visual special needs '[]'$  - 0 Assessment of Community assistance (transportation, D/[name] planning, etc.) '[]'$  - 0 Additional assistance / Altered mentation '[]'$  - 0 Support Surface(s) Assessment (bed, cushion, seat,  etc.) INTERVENTIONS - Wound Cleansing / Measurement X - Simple Wound Cleansing - one wound 1 5 '[]'$  - 0 Complex Wound Cleansing - multiple wounds '[]'$  - 0 Wound Imaging (photographs - any number of wounds) '[]'$  - 0 Wound Tracing (instead of photographs) '[]'$  - 0 Simple Wound Measurement - one wound '[]'$  - 0 Complex Wound Measurement - multiple wounds INTERVENTIONS - Wound Dressings X - Small Wound Dressing one or multiple wounds 1 10 '[]'$  - 0 Medium Wound Dressing one or multiple wounds '[]'$  - 0 Large Wound Dressing one or multiple wounds '[]'$  - 0 Application of Medications - topical '[]'$  - 0 Application of Medications - injection INTERVENTIONS - Miscellaneous '[]'$  - 0 External ear exam '[]'$  - 0 Specimen Collection (cultures, biopsies, blood, body fluids, etc.) '[]'$  - 0 Specimen(s) / Culture(s) sent or taken to Lab for analysis '[]'$  - 0 Patient Transfer (multiple staff / Civil Service fast streamer / Similar devices) '[]'$  - 0 Simple Staple / Suture removal (25 or less) '[]'$  - 0 Complex Staple / Suture removal (26 or more) '[]'$  - 0 Hypo / Hyperglycemic Management (close monitor of Blood Glucose) Michael [name], Michael [name] ([id number]) [id number] Page 3 of 5 '[]'$  - 0 Ankle / Brachial Index (ABI) - do not check if billed separately X- 1 5 Vital Signs Has the patient been seen at the hospital within the last three years: Yes Total Score: 55 Level Of Care: New/Established - Level 2 Electronic Signature(s) Unsigned Entered ByBlanche East on 04/06/2022 09:53:29 -------------------------------------------------------------------------------- Encounter Discharge Information Details Patient Name: Date of Service: Michael Michael [name], [name] 04/06/2022 9:15 A M Medical Record Number: [id number] Patient  Account Number: [id number] Date of Birth/Sex: Treating RN: Michael [name], Michael [name] (72 y.o. Michael [name] Primary Care T[place]: [name] Other Clinician: Referring Shivon Hackel: Treating Areal Cochrane/Extender: [name] in Treatment: 82 Encounter Discharge Information Items Discharge Condition: Stable Ambulatory Status: Ambulatory Discharge Destination: Home Transportation: Private Auto Accompanied By: wife Schedule Follow-up Appointment: Yes Clinical Summary of Care: Electronic Signature(s) Signed: 04/06/2022 9:52:50 AM By: [name] Entered By: Blanche East on 04/06/2022 09:52:50 -------------------------------------------------------------------------------- Patient/Caregiver Education Details Patient Name: Date of Service: Michael [name], [place]. 2/5/2024andnbsp9:15 [place] Record Number: [id number] Patient Account Number: [id number] Date of Birth/Gender: Treating RN: Michael [name][birth year] (72 y.o. Michael [name] Primary Care Physician: [name] Other Clinician: Referring Physician: Treating Physician/Extender: [name] in Treatment: 29 Education Assessment Education Provided To: Patient Education Topics Provided Wound/Skin Impairment: Methods: Explain/Verbal Responses: Reinforcements needed, State content correctly Electronic Signature(s) Michael [name], Michael [name] ([id number]) [id number] Page 4 of 5 Unsigned Entered By: Blanche East on 04/06/2022 09:52:38 -------------------------------------------------------------------------------- Wound Assessment Details Patient Name: Date of Service: Michael Michael [name], [name] 04/06/2022 9:15 A M Medical Record Number: [id number] Patient Account Number: [id number] Date of Birth/Sex: Treating RN: [birth year], Michael [name] (72 y.o. Michael [name] Primary Care Audine Mangione: [name] Other Clinician: Referring Xane Amsden: Treating Chivonne Rascon/Extender: [name] in Treatment: 17 Wound Status Wound Number: 3 Primary Diabetic Wound/Ulcer of the Lower Extremity Etiology: Wound Location: Right, Anterior Lower Leg Wound Open Wounding Event: Blister Status: Date Acquired: 09/30/2021 Comorbid Chronic sinus problems/congestion, Hypertension, Peripheral Weeks Of Treatment: 17 History: Arterial Disease, Peripheral Venous Disease, Type II Diabetes Clustered Wound: No Wound Measurements Length: (cm) 2.5 Width: (cm) 2.3 Depth: (cm) 0.1 Area: (cm) 4.516 Volume: (cm) 0.452 % Reduction in Area: 74.3% % Reduction in Volume: 91.4% Tunneling: No Undermining: No Wound Description Classification: Grade 2 Exudate Amount:  Medium Exudate Type: Serosanguineous Exudate Color: Michael, brown Wound Bed Granulation Amount: Large (67-100%) Granulation Quality: Pink Necrotic Amount: Small (1-33%) Necrotic Quality: Adherent Slough Periwound Skin Texture Texture Color No Abnormalities Noted: No No Abnormalities Noted: No Scarring: Yes Rubor: Yes Moisture Temperature / Pain No Abnormalities Noted: No Temperature: No Abnormality Dry / Scaly: Yes Treatment Notes Wound #3 (Lower Leg) Wound Laterality: Right, Anterior Cleanser Soap and Water Discharge Instruction: Michael shower and wash wound with dial antibacterial soap and water prior to dressing change. Wound Cleanser Discharge Instruction: Cleanse the wound with wound cleanser prior to applying a clean dressing using gauze sponges, not tissue or cotton balls. Peri-Wound Care Sween Lotion (Moisturizing lotion) Discharge Instruction: Apply moisturizing lotion as directed Topical Michael [name], Michael [name] ([id number]) [id number] Page 5 of 5 Primary Dressing ADAPTIC TOUCH 3x4.25 (in/in) Discharge Instruction: Apply to wound bed as instructed Endoform 2x2 in Discharge Instruction: Moisten with saline Secondary Dressing ABD Pad, 8x10 Discharge Instruction: Apply over primary  dressing as directed. Woven Gauze Sponge, Non-Sterile 4x4 in Discharge Instruction: Apply over primary dressing as directed. Secured With [employer] Group Size 5, 10 (yds) Compression Wrap Kerlix Roll 4.5x3.1 (in/yd) Discharge Instruction: Apply Kerlix and Coban compression as directed. Coban Self-Adherent Wrap 4x5 (in/yd) Discharge Instruction: Apply over Kerlix as directed. Unnaboot w/Calamine, 4x10 (in/yd) Discharge Instruction: Apply Unnaboot to top of dressing and around ankles to prevent dressing from sliding Compression Stockings Add-Ons Electronic Signature(s) Signed: 04/06/2022 9:52:05 AM By: [name] Entered By: Blanche East on 04/06/2022 09:52:04 -------------------------------------------------------------------------------- Vitals Details Patient Name: Date of Service: Michael [name], [name] 04/06/2022 9:15 A M Medical Record Number: [id number] Patient Account Number: [id number] Date of Birth/Sex: Treating RN: Michael [name][birth year] (72 y.o. Michael [name] Primary Care Michael [name]: [name] Other Clinician: Referring Pailyn Bellevue: Treating Maleiyah Releford/Extender: [name] in Treatment: 17 Vital Signs Time Taken: 09:30 Temperature (F): 98.4 Height (in): 72 Pulse (bpm): 85 Weight (lbs): 208 Respiratory Rate (breaths/min): 18 Body Mass Index (BMI): 28.2 Blood Pressure (mmHg): 119/64 Capillary Blood Glucose (mg/dl): 193 Reference Range: 80 - 120 mg / dl Electronic Signature(s) Signed: 04/06/2022 9:51:47 AM By: [name] Entered By: Blanche East on 04/06/2022 09:51:47

## 2022-04-11 DIAGNOSIS — E1165 Type 2 diabetes mellitus with hyperglycemia: Secondary | ICD-10-CM | POA: Diagnosis not present

## 2022-04-13 ENCOUNTER — Encounter (HOSPITAL_BASED_OUTPATIENT_CLINIC_OR_DEPARTMENT_OTHER): Payer: Medicare Other | Admitting: General Surgery

## 2022-04-13 DIAGNOSIS — T8131XA Disruption of external operation (surgical) wound, not elsewhere classified, initial encounter: Secondary | ICD-10-CM | POA: Diagnosis not present

## 2022-04-13 DIAGNOSIS — L97122 Non-pressure chronic ulcer of left thigh with fat layer exposed: Secondary | ICD-10-CM | POA: Diagnosis not present

## 2022-04-13 DIAGNOSIS — N189 Chronic kidney disease, unspecified: Secondary | ICD-10-CM | POA: Diagnosis not present

## 2022-04-13 DIAGNOSIS — E669 Obesity, unspecified: Secondary | ICD-10-CM | POA: Diagnosis not present

## 2022-04-13 DIAGNOSIS — I70239 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: Secondary | ICD-10-CM | POA: Diagnosis not present

## 2022-04-13 DIAGNOSIS — I129 Hypertensive chronic kidney disease with stage 1 through stage 4 chronic kidney disease, or unspecified chronic kidney disease: Secondary | ICD-10-CM | POA: Diagnosis not present

## 2022-04-13 DIAGNOSIS — E1122 Type 2 diabetes mellitus with diabetic chronic kidney disease: Secondary | ICD-10-CM | POA: Diagnosis not present

## 2022-04-13 DIAGNOSIS — L97815 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: Secondary | ICD-10-CM | POA: Diagnosis not present

## 2022-04-13 DIAGNOSIS — E1151 Type 2 diabetes mellitus with diabetic peripheral angiopathy without gangrene: Secondary | ICD-10-CM | POA: Diagnosis not present

## 2022-04-13 DIAGNOSIS — E785 Hyperlipidemia, unspecified: Secondary | ICD-10-CM | POA: Diagnosis not present

## 2022-04-13 DIAGNOSIS — L97812 Non-pressure chronic ulcer of other part of right lower leg with fat layer exposed: Secondary | ICD-10-CM | POA: Diagnosis not present

## 2022-04-13 DIAGNOSIS — E11622 Type 2 diabetes mellitus with other skin ulcer: Secondary | ICD-10-CM | POA: Diagnosis not present

## 2022-04-13 NOTE — Progress Notes (Signed)
Michael [name], Michael [name] ([id number]) [id number] Page 1 of 12 Visit Report for 04/13/2022 Chief Complaint Document Details Patient Name: Date of Service: HO FFMA [place], [name] 04/13/2022 9:15 A M Medical Record Number: [id number] Patient Account Number: [id number] Date of Birth/Sex: Treating RN: 17-Jan-1951 (72 y.o. M) Primary Care Provider: [name] Other Clinician: Referring Provider: Treating Provider/Extender: [name] in Treatment: 18 Information Obtained from: Patient Chief Complaint Patient presents to the wound care center today with an open arterial ulcer to the right lower extremity in the setting of diabetes mellitus. he has had this problem for 7 months 12/04/2021: ulcer to right lower anterior tibial surface Electronic Signature(s) Signed: 04/13/2022 9:38:52 AM By: [name] Entered By: Fredirick Maudlin on 04/13/2022 09:38:52 -------------------------------------------------------------------------------- Debridement Details Patient Name: Date of Service: [name], [name] 04/13/2022 9:15 A M Medical Record Number: [id number] Patient Account Number: [id number] Date of Birth/Sex: Treating RN: 06-11-1950 (72 y.o. [name] Primary Care Provider: [name] Other Clinician: Referring Provider: Treating Provider/Extender: [name] in Treatment: 18 Debridement Performed for Assessment: Wound #3 Right,Anterior Lower Leg Performed By: Physician Fredirick Maudlin, MD Debridement Type: Debridement Severity of Tissue Pre Debridement: Fat layer exposed Level of Consciousness (Pre-procedure): Awake and Alert Pre-procedure Verification/Time Out Yes - 09:22 Taken: Start Time: 09:22 Pain Control: Lidocaine 4% T opical Solution T Area Debrided (L x W): otal 3.5 (cm) x 3 (cm) = 10.5 (cm) Tissue and other material debrided: Non-Viable, Eschar, [place], Other:  Theraskin Level: Non-Viable Tissue Debridement Description: Selective/Open Wound Instrument: Curette Bleeding: Minimum Hemostasis Achieved: Pressure Response to Treatment: Procedure was tolerated well Level of Consciousness (Post- Awake and Alert procedure): Post Debridement Measurements of Total Wound Length: (cm) 3.5 Width: (cm) 3 Depth: (cm) 0.1 Volume: (cm) 0.825 Character of Wound/Ulcer Post Debridement: Improved Severity of Tissue Post Debridement: Fat layer exposed Michael [name], Michael [name] ([id number].pdf Page 2 of 12 Post Procedure Diagnosis Same as Pre-procedure Notes scribed for Dr. Celine Ahr by Adline Peals, RN Electronic Signature(s) Signed: 04/13/2022 11:23:53 AM By: [name] Signed: 04/13/2022 4:32:39 PM By: Adline Peals Entered By: Adline Peals on 04/13/2022 09:24:07 -------------------------------------------------------------------------------- Debridement Details Patient Name: Date of Service: [name], [name] 04/13/2022 9:15 A M Medical Record Number: [id number] Patient Account Number: [id number] Date of Birth/Sex: Treating RN: 1950/11/04 (72 y.o. [name] Primary Care Provider: [name] Other Clinician: Referring Provider: Treating Provider/Extender: [name] in Treatment: 18 Debridement Performed for Assessment: Wound #4 Right,Medial Upper Leg Performed By: Physician Fredirick Maudlin, MD Debridement Type: Debridement Level of Consciousness (Pre-procedure): Awake and Alert Pre-procedure Verification/Time Out Yes - 09:22 Taken: Start Time: 09:22 Pain Control: Lidocaine 4% T opical Solution T Area Debrided (L x W): otal 3.5 (cm) x 2 (cm) = 7 (cm) Tissue and other material debrided: Non-Viable, [place], Other: Theraskin Level: Non-Viable Tissue Debridement Description: Selective/Open Wound Instrument: Curette Bleeding:  Minimum Hemostasis Achieved: Pressure Response to Treatment: Procedure was tolerated well Level of Consciousness (Post- Awake and Alert procedure): Post Debridement Measurements of Total Wound Length: (cm) 3.5 Width: (cm) 2 Depth: (cm) 0.1 Volume: (cm) 0.55 Character of Wound/Ulcer Post Debridement: Improved Post Procedure Diagnosis Same as Pre-procedure Notes scribed for Dr. Celine Ahr by Adline Peals, RN Electronic Signature(s) Signed: 04/13/2022 11:23:53 AM By: [name] Signed: 04/13/2022 4:32:39 PM By: Adline Peals Entered By: Adline Peals on 04/13/2022 09:29:57 HPI Details -------------------------------------------------------------------------------- Michael [name] ([id number].pdf  Page 3 of 12 Patient Name: Date of Service: HO FFMA [place], [name] 04/13/2022 9:15 A M Medical Record Number: [id number] Patient Account Number: [id number] Date of Birth/Sex: Treating RN: 09/08/1950 (72 y.o. M) Primary Care Provider: [name] Other Clinician: Referring Provider: Treating Provider/Extender: [name] in Treatment: 18 History of Present Illness Location: right lateral calf closer to the knee Quality: Patient reports experiencing a dull pain to affected area(s). Severity: Patient states wound(s) are getting worse. Duration: Patient has had the wound for > 7 months prior to seeking treatment at the wound center Timing: Pain in wound is constant (hurts all the time) Context: The wound would happen gradually Modifying Factors: Patient wound(s)/ulcer(s) are worsening due to : no resolution and a white material at the base of the wound ssociated Signs and Symptoms: Patient reports having:no discharge or purulent material A HPI Description: 72 year old gentleman who has been referred to was from his PCP for a chronic ulceration on his right lower extremity which she's had for several  weeks. Past medical history significant for diabetes mellitus type 2, hypertension, hyperlipidemia, anxiety, obesity, peripheral vascular disease and chronic kidney disease. He has never been a smoker. Most recent lab work done at his PCPs office showed a glucose of 217 milligrams per deciliter which is consistent with hyperglycemia. In January 2017 recent hemoglobin A1c values noted to be 8.2%. In April 2017, he has been seen at the vascular office by Dr. Trula Slade and Dr. Kellie Simmering for right leg claudication. He had a right superficial femoral artery stent in September 2012. Recent noninvasive vascular imaging done on 06/24/2015 showed a right ABI of 1.07 with a triphasic waveform and a right TBI of 0.81. The left was noncompressible with a triphasic waveform and a TBI of 1.17. Right lower extremity arterial duplex was unable to identify stent exit but they were elevated velocities at the stent and in the distal thigh with triphasic waveforms. Dr. Stephens Shire opinion was to return to the clinic in 6 months with ABI and right lower extremity arterial duplex studies to be done. He has seen a dermatologist who had done a biopsy and said it was benign and he was given a steroid cream. 10/23/2015 -- Pathology report of the biopsy done in April 2017 shows ulcer with underlying angiodermatitis consistent with stasis dermatitis. 10/30/2015 -- he is going for shoulder surgery in about 10 days' time and was asking about the perioperative care. His blood sugars are running in the high 100s or low 200s. No hemoglobin A1c done recently. 11/20/2015 -- is back up to 2 weeks because of recent left shoulder surgery. 12/04/15; patient returns today with the wound for the most part looking healthy. No evidence of infection no debridement required. He is using Prisma for 4 weeks, without obvious improvement per her intake nurse. This started as several small open areas that were raised erythematous. He saw dermatology and  at some point this was biopsied that just suggested stasis skin physiology. This certainly doesn't look like that 12/11/15; using Hydrafera Blue. Previous biopsy reviewed, no atypia PAS negative. He has a history of stents in the right leg however he does not appear to have primary arterial insufficiency ABI in this clinic was over 0.9. 12/25/2015 -- he has been approved for grafix and we will order for some to be applied next week 01/01/2016 -- he has had his first application of Grafix today 01/08/2016 -- he has had his second application of Grafix today READMISSION 12/04/2021  This is a now 72 year old man who was followed in our clinic about 5 years ago for an ulceration on his right lateral lower leg, just distal to the knee. He has since undergone a number of revascularization procedures that have been complicated by restenosis and wound infections. During the course of this treatment, he developed a small ulcer on his right anterior tibial surface. Despite use of an Unna boot, the wound has continued to expand. He was referred to the wound care center by Dr. Trula Slade for further evaluation and management. On the right anterior tibial surface, there is an irregular wound with heavy slough and eschar accumulation. The periwound skin is intact but he does have 1-2+ pitting edema. There is no purulent drainage or malodor. 10/13; second visit for this man who has an ischemic wound in the setting of type 2 diabetes on the right anterior mid tibia area. He has been revascularized. Using Santyl gent 12/22/2021: The wound is about the same size this week. There is a lot of gray sloughy material on the surface, secondary to silver nitrate used to stop bleeding after what sounds like a fairly aggressive debridement last week. 12/30/2021: The wound is smaller this week and a bit cleaner. Edema control is excellent. There is still a bit of slough accumulation. 01/07/2022: The wound continues to contract  and is much cleaner this week. Edema control is very good. He has small openings in his upper leg surgical scars, but he is going to be seeing Dr. Trula Slade next Monday and will have him take a look. He has been applying manuka honey to both of the sites. We have been using Santyl and gentamicin on his lower leg wound. 01/14/2022: He saw vascular surgery this week and was told that they were happy to have [place] manage the 2 wounds from his operation. Both of these have a light layer of slough on the surface. The more distal wound is a little bit dry. The anterior tibial wound continues to accumulate a fair amount of slough. Edema control is good. 01/19/2022: Both upper leg wounds are smaller. The more distal is quite dry and cracked when I was examining it. The lower leg wound is more painful and the surrounding tissue is erythematous and indurated. 01/26/2022: The upper leg wounds are healed. The skin at the distal leg wound is quite dry. The lower leg wound is cleaner but still fairly painful. His wrap slid again this week. 02/02/2022: We had good success keeping his wrap intact using a standard Unna boot. The lower leg wound continues to contract and is clean and less painful this week. 12/11; TheraSkin #1 12/18; TheraSkin #2. Wound looks as though it is contracted. Patient states that his pain was better in the leg. Secondary dressing and kerlix Coban 02/24/2022: The wound is smaller since I saw it last. It is less painful and there is good granulation tissue, including over the exposed tendon. 03/09/2022: The wound continues to contract. There is continued fill of the wound cavity with granulation tissue. 03/16/2022: The wound is smaller again today. There is good granulation tissue forming, covering the tendon. SHAD, ANDAYA ([id number]) [id number] Page 4 of 12 03/30/2022: The wound continues to contract and fill with good granulation tissue. The granulation tissue is  oozing a little bit at the lateral edge. 04/13/2022: We left his TheraSkin on an extra week because it was looking so good. The wound is smaller this week and flush with the surrounding skin. His medial thigh  wound has opened up again. There is slough and eschar present. Electronic Signature(s) Signed: 04/13/2022 9:39:52 AM By: [name] Entered By: Fredirick Maudlin on 04/13/2022 09:39:52 -------------------------------------------------------------------------------- Physical Exam Details Patient Name: Date of Service: [name], [name] 04/13/2022 9:15 A M Medical Record Number: [id number] Patient Account Number: [id number] Date of Birth/Sex: Treating RN: 1950-08-07 (72 y.o. M) Primary Care Provider: [name] Other Clinician: Referring Provider: Treating Provider/Extender: [name] in Treatment: 18 Constitutional Hypertensive, asymptomatic. Tachycardic, asymptomatic. . . no acute distress. Respiratory Normal work of breathing on room air. Notes 04/13/2022: The wound is smaller this week and flush with the surrounding skin. His medial thigh wound has opened up again. There is slough and eschar present. Electronic Signature(s) Signed: 04/13/2022 9:40:46 AM By: [name] Entered By: Fredirick Maudlin on 04/13/2022 09:40:46 -------------------------------------------------------------------------------- Physician Orders Details Patient Name: Date of Service: [name], [name] 04/13/2022 9:15 A M Medical Record Number: [id number] Patient Account Number: [id number] Date of Birth/Sex: Treating RN: [birth year] (72 y.o. [name] Primary Care Provider: [name] Other Clinician: Referring Provider: Treating Provider/Extender: [name] in Treatment: [id number] Verbal / Phone Orders: No Diagnosis Coding ICD-10 Coding Code Description [id number] Non-pressure chronic ulcer of other part of  right lower leg with muscle involvement without evidence of necrosis I70.239 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 E11.622 Type 2 diabetes mellitus with other skin ulcer I73.9 Peripheral vascular disease, unspecified L97.122 Non-pressure chronic ulcer of left thigh with fat layer exposed Follow-up Appointments ppointment in 1 week. - Dr. Celine Ahr - room 4 Return A Anesthetic IZAIAH, SKATES ([id number]) [phone].pdf Page 5 of 12 Wound #3 Right,Anterior Lower Leg (In clinic) Topical Lidocaine 4% applied to wound bed Edema Control - Lymphedema / SCD / Other Right Lower Extremity Avoid standing for long periods of time. Wound Treatment Wound #3 - Lower Leg Wound Laterality: Right, Anterior Cleanser: Soap and Water 1 x Per Week/30 Days Discharge Instructions: May shower and wash wound with dial antibacterial soap and water prior to dressing change. Cleanser: Wound Cleanser 1 x Per Week/30 Days Discharge Instructions: Cleanse the wound with wound cleanser prior to applying a clean dressing using gauze sponges, not tissue or cotton balls. Peri-Wound Care: Sween Lotion (Moisturizing lotion) 1 x Per Week/30 Days Discharge Instructions: Apply moisturizing lotion as directed Prim Dressing: Endoform 2x2 in 1 x Per Week/30 Days ary Discharge Instructions: Moisten with saline Secondary Dressing: ABD Pad, 8x10 1 x Per Week/30 Days Discharge Instructions: Apply over primary dressing as directed. Secondary Dressing: Woven Gauze Sponge, Non-Sterile 4x4 in 1 x Per Week/30 Days Discharge Instructions: Apply over primary dressing as directed. Secured With: [employer] Size 5, 10 (yds) 1 x Per Week/30 Days Compression Wrap: Kerlix Roll 4.5x3.1 (in/yd) 1 x Per Week/30 Days Discharge Instructions: Apply Kerlix and Coban compression as directed. Compression Wrap: Coban Self-Adherent Wrap 4x5 (in/yd) 1 x Per Week/30 Days Discharge  Instructions: Apply over Kerlix as directed. Compression Wrap: Unnaboot w/Calamine, 4x10 (in/yd) 1 x Per Week/30 Days Discharge Instructions: Apply Unnaboot to top of dressing and around ankles to prevent dressing from sliding Wound #4 - Upper Leg Wound Laterality: Right, Medial Cleanser: Soap and Water 1 x Per Week/30 Days Discharge Instructions: May shower and wash wound with dial antibacterial soap and water prior to dressing change. Cleanser: Wound Cleanser 1 x Per Week/30 Days Discharge Instructions: Cleanse the wound with wound cleanser  prior to applying a clean dressing using gauze sponges, not tissue or cotton balls. Prim Dressing: Endoform 2x2 in 1 x Per Week/30 Days ary Discharge Instructions: Moisten with saline Secondary Dressing: Zetuvit Plus Silicone Border Dressing 4x4 (in/in) 1 x Per Week/30 Days Discharge Instructions: Apply silicone border over primary dressing as directed. Patient Medications llergies: Actos, Demerol, glimepiride, nabumetone, oxycodone HCl, penicillin A Notifications Medication Indication Start End 04/13/2022 lidocaine DOSE topical 4 % cream - cream topical Electronic Signature(s) Signed: 04/13/2022 11:23:53 AM By: [name] Entered By: Fredirick Maudlin on 04/13/2022 09:42:27 Problem List Details -------------------------------------------------------------------------------- Michael [name] ([id number].pdf Page 6 of 12 Patient Name: Date of Service: HO FFMA [place], [name] 04/13/2022 9:15 A M Medical Record Number: [id number] Patient Account Number: [id number] Date of Birth/Sex: Treating RN: 06-18-1950 (72 y.o. M) Primary Care Provider: [name] Other Clinician: Referring Provider: Treating Provider/Extender: [name] in Treatment: 18 Active Problems ICD-10 Encounter Code Description Active Date MDM Diagnosis L97.815 Non-pressure chronic ulcer of other part of  right lower leg with muscle 12/04/2021 No Yes involvement without evidence of necrosis I70.239 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 12/04/2021 No Yes E11.622 Type 2 diabetes mellitus with other skin ulcer 12/04/2021 No Yes I73.9 Peripheral vascular disease, unspecified 12/04/2021 No Yes L97.122 Non-pressure chronic ulcer of left thigh with fat layer exposed 01/14/2022 No Yes Inactive Problems Resolved Problems Electronic Signature(s) Signed: 04/13/2022 9:42:12 AM By: [name] Previous Signature: 04/13/2022 9:38:16 AM Version By: [name] Entered By: Fredirick Maudlin on 04/13/2022 09:42:11 -------------------------------------------------------------------------------- Progress Note Details Patient Name: Date of Service: [name], [name] 04/13/2022 9:15 A M Medical Record Number: [id number] Patient Account Number: [id number] Date of Birth/Sex: Treating RN: 05-12-1950 (72 y.o. M) Primary Care Provider: [name] Other Clinician: Referring Provider: Treating Provider/Extender: [name] in Treatment: 18 Subjective Chief Complaint Information obtained from Patient Patient presents to the wound care center today with an open arterial ulcer to the right lower extremity in the setting of diabetes mellitus. he has had this problem for 7 months 12/04/2021: ulcer to right lower anterior tibial surface History of Present Illness (HPI) The following HPI elements were documented for the patient's wound: Location: right lateral calf closer to the knee Quality: Patient reports experiencing a dull pain to affected area(s). Severity: Patient states wound(s) are getting worse. Duration: Patient has had the wound for > 7 months prior to seeking treatment at the wound center Timing: Pain in wound is constant (hurts all the time) Context: The wound would happen gradually Modifying Factors:  Patient wound(s)/ulcer(s) are worsening due to : no resolution and a white material at the base of the wound JADENN, BROOKBANK [name] ([id number]) [id number].pdf Page 7 of 12 Associated Signs and Symptoms: Patient reports having:no discharge or purulent material 72 year old gentleman who has been referred to was from his PCP for a chronic ulceration on his right lower extremity which she's had for several weeks. Past medical history significant for diabetes mellitus type 2, hypertension, hyperlipidemia, anxiety, obesity, peripheral vascular disease and chronic kidney disease. He has never been a smoker. Most recent lab work done at his PCPs office showed a glucose of 217 milligrams per deciliter which is consistent with hyperglycemia. In January 2017 recent hemoglobin A1c values noted to be 8.2%. In April 2017, he has been seen at the vascular office by Dr. Trula Slade and Dr. Kellie Simmering for right leg  claudication. He had a right superficial femoral artery stent in September 2012. Recent noninvasive vascular imaging done on 06/24/2015 showed a right ABI of 1.07 with a triphasic waveform and a right TBI of 0.81. The left was noncompressible with a triphasic waveform and a TBI of 1.17. Right lower extremity arterial duplex was unable to identify stent exit but they were elevated velocities at the stent and in the distal thigh with triphasic waveforms. Dr. Stephens Shire opinion was to return to the clinic in 6 months with ABI and right lower extremity arterial duplex studies to be done. He has seen a dermatologist who had done a biopsy and said it was benign and he was given a steroid cream. 10/23/2015 -- Pathology report of the biopsy done in April 2017 shows ulcer with underlying angiodermatitis consistent with stasis dermatitis. 10/30/2015 -- he is going for shoulder surgery in about 10 days' time and was asking about the perioperative care. His blood sugars are running in the high 100s or low  200s. No hemoglobin A1c done recently. 11/20/2015 -- is back up to 2 weeks because of recent left shoulder surgery. 12/04/15; patient returns today with the wound for the most part looking healthy. No evidence of infection no debridement required. He is using Prisma for 4 weeks, without obvious improvement per her intake nurse. This started as several small open areas that were raised erythematous. He saw dermatology and at some point this was biopsied that just suggested stasis skin physiology. This certainly doesn't look like that 12/11/15; using Hydrafera Blue. Previous biopsy reviewed, no atypia PAS negative. He has a history of stents in the right leg however he does not appear to have primary arterial insufficiency ABI in this clinic was over 0.9. 12/25/2015 -- he has been approved for grafix and we will order for some to be applied next week 01/01/2016 -- he has had his first application of Grafix today 01/08/2016 -- he has had his second application of Grafix today READMISSION 12/04/2021 This is a now 72 year old man who was followed in our clinic about 5 years ago for an ulceration on his right lateral lower leg, just distal to the knee. He has since undergone a number of revascularization procedures that have been complicated by restenosis and wound infections. During the course of this treatment, he developed a small ulcer on his right anterior tibial surface. Despite use of an Unna boot, the wound has continued to expand. He was referred to the wound care center by Dr. Trula Slade for further evaluation and management. On the right anterior tibial surface, there is an irregular wound with heavy slough and eschar accumulation. The periwound skin is intact but he does have 1-2+ pitting edema. There is no purulent drainage or malodor. 10/13; second visit for this man who has an ischemic wound in the setting of type 2 diabetes on the right anterior mid tibia area. He has been  revascularized. Using Santyl gent 12/22/2021: The wound is about the same size this week. There is a lot of gray sloughy material on the surface, secondary to silver nitrate used to stop bleeding after what sounds like a fairly aggressive debridement last week. 12/30/2021: The wound is smaller this week and a bit cleaner. Edema control is excellent. There is still a bit of slough accumulation. 01/07/2022: The wound continues to contract and is much cleaner this week. Edema control is very good. He has small openings in his upper leg surgical scars, but he is going to be seeing Dr.  [name] next Monday and will have him take a look. He has been applying manuka honey to both of the sites. We have been using Santyl and gentamicin on his lower leg wound. 01/14/2022: He saw vascular surgery this week and was told that they were happy to have [place] manage the 2 wounds from his operation. Both of these have a light layer of slough on the surface. The more distal wound is a little bit dry. The anterior tibial wound continues to accumulate a fair amount of slough. Edema control is good. 01/19/2022: Both upper leg wounds are smaller. The more distal is quite dry and cracked when I was examining it. The lower leg wound is more painful and the surrounding tissue is erythematous and indurated. 01/26/2022: The upper leg wounds are healed. The skin at the distal leg wound is quite dry. The lower leg wound is cleaner but still fairly painful. His wrap slid again this week. 02/02/2022: We had good success keeping his wrap intact using a standard Unna boot. The lower leg wound continues to contract and is clean and less painful this week. 12/11; TheraSkin #1 12/18; TheraSkin #2. Wound looks as though it is contracted. Patient states that his pain was better in the leg. Secondary dressing and kerlix Coban 02/24/2022: The wound is smaller since I saw it last. It is less painful and there is good granulation tissue,  including over the exposed tendon. 03/09/2022: The wound continues to contract. There is continued fill of the wound cavity with granulation tissue. 03/16/2022: The wound is smaller again today. There is good granulation tissue forming, covering the tendon. 03/30/2022: The wound continues to contract and fill with good granulation tissue. The granulation tissue is oozing a little bit at the lateral edge. 04/13/2022: We left his TheraSkin on an extra week because it was looking so good. The wound is smaller this week and flush with the surrounding skin. His medial thigh wound has opened up again. There is slough and eschar present. Patient History Information obtained from Patient. Family History Cancer - Siblings, Heart Disease - Father, Hypertension - Father,Mother, No family history of Hereditary Spherocytosis, Kidney Disease, Seizures, Stroke, Thyroid Problems, Tuberculosis. Social History Former smoker - smokeless tobacco, Marital Status - Married, Alcohol Use - Rarely - WINE, Drug Use - No History, Caffeine Use - Daily - COFFEE. Medical History ARVINE, RODEMAN ([id number]) [id number] Page 8 of 12 Ear/Nose/Mouth/Throat Patient has history of Chronic sinus problems/congestion - seasonal allergies Cardiovascular Patient has history of Hypertension, Peripheral Arterial Disease - STENTS, Peripheral Venous Disease Endocrine Patient has history of Type II Diabetes - last A1c- 8.2 Hospitalization/Surgery History - left shoulder surgery. Medical A Surgical History Notes nd Constitutional Symptoms (General Health) obesity , h/o (R) leg claudication (right fem stent September 2012) Cardiovascular hyperlipidemia Endocrine pt. on diet intentionally losing 20 pounds since December 2016 in an effort to improve A1C levels Integumentary (Skin) psoriatic arthritis Musculoskeletal bilat knee pain identified as an ortho issue psoriatic arthritis Oncologic Prostate cancer  2018 Psychiatric anxiety Objective Constitutional Hypertensive, asymptomatic. Tachycardic, asymptomatic. no acute distress. Vitals Time Taken: 9:10 AM, Height: 72 in, Weight: 208 lbs, BMI: 28.2, Temperature: 98.4 F, Pulse: 114 bpm, Respiratory Rate: 18 breaths/min, Blood Pressure: 160/72 mmHg, Capillary Blood Glucose: 92 mg/dl. Respiratory Normal work of breathing on room air. General Notes: 04/13/2022: The wound is smaller this week and flush with the surrounding skin. His medial thigh wound has opened up again. There is slough and eschar present. Integumentary (Hair, Skin)  Wound #3 status is Open. Original cause of wound was Blister. The date acquired was: 09/30/2021. The wound has been in treatment 18 weeks. The wound is located on the Right,Anterior Lower Leg. The wound measures 3.5cm length x 3cm width x 0.1cm depth; 8.247cm^2 area and 0.825cm^3 volume. There is Fat Layer (Subcutaneous Tissue) exposed. There is no tunneling or undermining noted. There is a medium amount of serosanguineous drainage noted. The wound margin is distinct with the outline attached to the wound base. There is large (67-100%) pink granulation within the wound bed. There is a small (1-33%) amount of necrotic tissue within the wound bed including Adherent Slough. The periwound skin appearance exhibited: Scarring, Dry/Scaly, Rubor. Periwound temperature was noted as No Abnormality. Wound #4 status is Open. Original cause of wound was Gradually Appeared. The date acquired was: 01/05/2022. The wound has been in treatment 13 weeks. The wound is located on the Right,Medial Upper Leg. The wound measures 3.5cm length x 2cm width x 0.1cm depth; 5.498cm^2 area and 0.55cm^3 volume. There is Fat Layer (Subcutaneous Tissue) exposed. There is no tunneling or undermining noted. There is a medium amount of serosanguineous drainage noted. The wound margin is distinct with the outline attached to the wound base. There is small (1-33%)  red, pink granulation within the wound bed. There is a large (67- 100%) amount of necrotic tissue within the wound bed including Eschar and Adherent Slough. The periwound skin appearance had no abnormalities noted for moisture. The periwound skin appearance had no abnormalities noted for color. The periwound skin appearance exhibited: Scarring. Periwound temperature was noted as No Abnormality. 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 There was a Selective/Open Wound Non-Viable Tissue Debridement with a total area of 10.5 sq cm performed by Fredirick Maudlin, MD. With the following instrument(s): Curette to remove Non-Viable tissue/material. Material removed includes [place], and Other: Theraskin after achieving pain control using Lidocaine 4% Topical Solution. No specimens were taken. A time out was conducted at 09:22, prior to the start of the procedure. A Minimum amount of bleeding was controlled with Pressure. The procedure was tolerated well. Post Debridement Measurements: 3.5cm length x 3cm width x 0.1cm depth; 0.825cm^3 volume. Character of Wound/Ulcer Post Debridement is improved. Severity of Tissue Post Debridement is: Fat layer exposed. Post procedure Diagnosis Wound #3: Same as Pre-Procedure General Notes: scribed for Dr. Celine Ahr by Adline Peals,  RN. Wound #4 Pre-procedure diagnosis of Wound #4 is a Dehisced Wound located on the Right,Medial Upper Leg . There was a Selective/Open Wound Non-Viable Tissue Debridement with a total area of 7 sq cm performed by Fredirick Maudlin, MD. With the following instrument(s): Curette to remove Non-Viable tissue/material. Material removed includes Slough and Other: Theraskin after achieving pain control using Lidocaine 4% Topical Solution. No specimens were taken. A time out was conducted at 09:22, prior to the start of the procedure. A Minimum amount of bleeding was controlled with Pressure. The procedure was tolerated well. Post Debridement Measurements: 3.5cm length x  2cm width x 0.1cm depth; 0.55cm^3 volume. Character of Wound/Ulcer Post Debridement is improved. Post procedure Diagnosis Wound #4: Same as Pre-Procedure General Notes: scribed for Dr. Celine Ahr by Adline Peals, RN. Plan Follow-up Appointments: Return Appointment in 1 week. - Dr. Celine Ahr - room 4 Anesthetic: Wound #3 Right,Anterior Lower Leg: (In clinic) Topical Lidocaine 4% applied to wound bed Edema Control - Lymphedema / SCD / Other: Avoid standing for long periods of time. The following medication(s) was prescribed: lidocaine topical 4 % cream cream topical was prescribed at facility WOUND #3: - Lower Leg Wound Laterality: Right, Anterior Cleanser: Soap and Water 1 x Per Week/30 Days Discharge Instructions: May shower and wash wound with dial antibacterial soap and water prior to dressing change. Cleanser: Wound Cleanser 1 x Per Week/30 Days Discharge Instructions: Cleanse the wound with wound cleanser prior to applying a clean dressing using gauze sponges, not tissue or cotton balls. Peri-Wound Care: Sween Lotion (Moisturizing lotion) 1 x Per Week/30 Days Discharge Instructions: Apply moisturizing lotion as directed Prim Dressing: Endoform 2x2 in 1 x Per Week/30 Days ary Discharge Instructions: Moisten with  saline Secondary Dressing: ABD Pad, 8x10 1 x Per Week/30 Days Discharge Instructions: Apply over primary dressing as directed. Secondary Dressing: Woven Gauze Sponge, Non-Sterile 4x4 in 1 x Per Week/30 Days Discharge Instructions: Apply over primary dressing as directed. Secured With: [employer] Size 5, 10 (yds) 1 x Per Week/30 Days Com pression Wrap: Kerlix Roll 4.5x3.1 (in/yd) 1 x Per Week/30 Days Discharge Instructions: Apply Kerlix and Coban compression as directed. Com pression Wrap: Coban Self-Adherent Wrap 4x5 (in/yd) 1 x Per Week/30 Days Discharge Instructions: Apply over Kerlix as directed. Com pression Wrap: Unnaboot w/Calamine, 4x10 (in/yd) 1 x Per Week/30 Days Discharge Instructions: Apply Unnaboot to top of dressing and around ankles to prevent dressing from sliding WOUND #4: - Upper Leg Wound Laterality: Right, Medial Cleanser: Soap and Water 1 x Per Week/30 Days Discharge Instructions: May shower and wash wound with dial antibacterial soap and water prior to dressing change. Cleanser: Wound Cleanser 1 x Per Week/30 Days Discharge Instructions: Cleanse the wound with wound cleanser prior to applying a clean dressing using gauze sponges, not tissue or cotton balls. Prim Dressing: Endoform 2x2 in 1 x Per Week/30 Days ary Discharge Instructions: Moisten with saline Secondary Dressing: Zetuvit Plus Silicone Border Dressing 4x4 (in/in) 1 x Per Week/30 Days Discharge Instructions: Apply silicone border over primary dressing as directed. 04/13/2022: The wound is smaller this week and flush with the surrounding skin. His medial thigh wound has opened up again. There is slough and eschar present. I used a curette to debride slough and eschar off of both wounds. We have used his 5 TheraSkin applications so I am going to go back to endoform. We will also use this on the medial thigh wound. Continue Kerlix and Coban wrap to the lower leg. The thigh wound will be changed daily. Follow-up in  1 week. Electronic Signature(s) Signed: 04/13/2022 9:43:49 AM By: [name] Entered By: Fredirick Maudlin on 04/13/2022 09:43:48 Michael [name], Michael [name] ([id number].pdf Page 10 of 12 -------------------------------------------------------------------------------- HxROS Details Patient Name: Date of Service: [name], [name] 04/13/2022 9:15 A M Medical Record Number: [id number] Patient Account Number: [id number] Date of Birth/Sex: Treating RN: April 17, 1950 (72 y.o. M) Primary Care Provider: [name] Other Clinician: Referring Provider: Treating Provider/Extender: [name] in Treatment: 18 Information Obtained From Patient Constitutional Symptoms (General Health) Medical History: Past Medical History Notes:  obesity , h/o (R) leg claudication (right fem stent September 2012) Ear/Nose/Mouth/Throat Medical History: Positive for: Chronic sinus problems/congestion - seasonal allergies Cardiovascular Medical History: Positive for: Hypertension; Peripheral Arterial Disease - STENTS; Peripheral Venous Disease Past Medical History Notes: hyperlipidemia Endocrine Medical History: Positive for: Type II Diabetes - last A1c- 8.2 Past Medical History Notes: pt. on diet intentionally losing 20 pounds since December 2016 in an effort to improve A1C levels Time with diabetes: 9 YRS Treated with: Insulin Blood sugar tested every day: Yes Tested : 7-8 TIMES Integumentary (Skin) Medical History: Past Medical History Notes: psoriatic arthritis Musculoskeletal Medical History: Past Medical History Notes: bilat knee pain identified as an ortho issue psoriatic arthritis Oncologic Medical History: Past Medical History Notes: Prostate cancer 2018 Psychiatric Medical History: Past Medical History Notes: anxiety HBO Extended History Items Ear/Nose/Mouth/Throat: Chronic sinus  problems/congestion Immunizations ANVITH, Michael [name] ([id number]) [id number].pdf Page 11 of 12 Pneumococcal Vaccine: Received Pneumococcal Vaccination: Yes Received Pneumococcal Vaccination On or After 60th Birthday: Yes Implantable Devices None Hospitalization / Surgery History Type of Hospitalization/Surgery left shoulder surgery Family and Social History Cancer: Yes - Siblings; Heart Disease: Yes - Father; Hereditary Spherocytosis: No; Hypertension: Yes - Father,Mother; Kidney Disease: No; Seizures: No; Stroke: No; Thyroid Problems: No; Tuberculosis: No; Former smoker - smokeless tobacco; Marital Status - Married; Alcohol Use: Rarely - WINE; Drug Use: No History; Caffeine Use: Daily - COFFEE; Financial Concerns: No; Food, Clothing or Shelter Needs: No; Support System Lacking: No; Transportation Concerns: No Electronic Signature(s) Signed: 04/13/2022 11:23:53 AM By: [name] Entered By: Fredirick Maudlin on 04/13/2022 09:39:58 -------------------------------------------------------------------------------- SuperBill Details Patient Name: Date of Service: [name], [place] [name]. 04/13/2022 Medical Record Number: [id number] Patient Account Number: [id number] Date of Birth/Sex: Treating RN: 1950-11-30 (72 y.o. 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 E11.622 Type 2 diabetes mellitus with other skin ulcer I73.9 Peripheral vascular disease, unspecified L97.122 Non-pressure chronic ulcer of left thigh with fat layer exposed Facility Procedures : CPT4 Code Description:  [id number] - DEBRIDE WOUND 1ST 20 SQ CM OR < ICD-10 Diagnosis Description L97.815 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 involvemen L97.122 Non-pressure chronic ulcer of left thigh with fat  layer exposed Modifier: t without evidence Quantity: 1 of necrosis Physician Procedures : CPT4 Code Description Modifier [id number] - WC PHYS LEVEL 3 - EST PT 25 ICD-10 Diagnosis Description L97.815 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 without evidence L97.122 Non-pressure chronic ulcer of  left thigh with fat layer exposed E11.622 Type 2 diabetes mellitus with other skin ulcer I70.239 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

## 2022-04-13 NOTE — Progress Notes (Signed)
MADDOCK, BROSNAHAN ([id number]) [id number] Page 1 of 9 Visit Report for 04/13/2022 Arrival Information Details Patient Name: Date of Service: Michael [name], Michael C. 04/13/2022 9:15 A M Medical Record Number: [id number] Patient Account Number: [id number] Date of Birth/Sex: Treating RN: 1950/09/16 (72 y.o. [name] Primary Care Tige Meas: [name] Other Clinician: Referring Jaidan Prevette: Treating Lain Tetterton/Extender: [name] in Treatment: 18 Visit Information History Since Last Visit Added or deleted any medications: No Patient Arrived: Ambulatory Any new allergies or adverse reactions: No Arrival Time: 09:10 Had a fall or experienced change in No Accompanied By: wife activities of daily living that may affect Transfer Assistance: None risk of falls: Patient Identification Verified: Yes Signs or symptoms of abuse/neglect since last visito No Secondary Verification Process Completed: Yes Hospitalized since last visit: No Patient Requires Transmission-Based Precautions: No Implantable device outside of the clinic excluding No Patient Has Alerts: Yes cellular tissue based products placed in the center Patient Alerts: Patient on Blood Thinner since last visit: plavix Has Dressing in Place as Prescribed: Yes Has Compression in Place as Prescribed: Yes Pain Present Now: Yes Electronic Signature(s) Signed: 04/13/2022 4:32:39 PM By: Adline Peals Entered By: Adline Peals on 04/13/2022 09:10:46 -------------------------------------------------------------------------------- Encounter Discharge Information Details Patient Name: Date of Service: Michael [name], Michael LTER C. 04/13/2022 9:15 A M Medical Record Number: [id number] Patient Account Number: [id number] Date of Birth/Sex: Treating RN: Jun 14, 1950 (72 y.o. [name] Primary Care Atarah Cadogan: [name] Other Clinician: Referring Joyell Emami: Treating  Ayyan Sites/Extender: [name] in Treatment: 18 Encounter Discharge Information Items Post Procedure Vitals Discharge Condition: Stable Temperature (F): 98.4 Ambulatory Status: Ambulatory Pulse (bpm): 114 Discharge Destination: Home Respiratory Rate (breaths/min): 18 Transportation: Private Auto Blood Pressure (mmHg): 160/72 Accompanied By: self Schedule Follow-up Appointment: Yes Clinical Summary of Care: Patient Declined Electronic Signature(s) Signed: 04/13/2022 4:32:39 PM By: Adline Peals Entered By: Adline Peals on 04/13/2022 10:26:24 [name] ([id number] Page 2 of 9 -------------------------------------------------------------------------------- Lower Extremity Assessment Details Patient Name: Date of Service: Michael [name], Michael C. 04/13/2022 9:15 A M Medical Record Number: [id number] Patient Account Number: [id number] Date of Birth/Sex: Treating RN: 06-Sep-1950 (73 y.o. [name] Primary Care Isabella Roemmich: [name] Other Clinician: Referring Virgilio Broadhead: Treating Saydie Gerdts/Extender: [name] in Treatment: 18 Edema Assessment Assessed: Shirlyn Goltz: No] Patrice Paradise: No] Edema: [Left: Ye] [Right: s] Calf Left: Right: Point of Measurement: 35 cm From Medial Instep 34.5 cm Ankle Left: Right: Point of Measurement: 12 cm From Medial Instep 21 cm Vascular Assessment Pulses: Dorsalis Pedis Palpable: [Right:Yes] Electronic Signature(s) Signed: 04/13/2022 4:32:39 PM By: Adline Peals Entered By: Adline Peals on 04/13/2022 09:13:54 -------------------------------------------------------------------------------- Multi Wound Chart Details Patient Name: Date of Service: Michael [name], Michael LTER C. 04/13/2022 9:15 A M Medical Record Number: [id number] Patient Account Number: [id number] Date of Birth/Sex: Treating RN: November 05, 1950 (72 y.o. M) Primary Care Blane Worthington:  [name] Other Clinician: Referring [name]: Treating Armelia Penton/Extender: [name] in Treatment: 18 Vital Signs Height(in): 72 Capillary Blood Glucose(mg/dl): 92 Weight(lbs): 208 Pulse(bpm): 114 Body Mass Index(BMI): 28.2 Blood Pressure(mmHg): 160/72 Temperature(F): 98.4 Respiratory Rate(breaths/min): 18 [3:Photos:] [4:No Photos] [[name]/A:[name]/A [id number].pdf Page 3 of 9] Right, Anterior Lower Leg Right, Medial Upper Leg [name]/A Wound Location: Blister Gradually Appeared [name]/A Wounding Event: Diabetic Wound/Ulcer of the Lower Dehisced Wound [name]/A Primary Etiology: Extremity Chronic sinus problems/congestion, Chronic sinus problems/congestion, [name]/A Comorbid History: Hypertension, Peripheral Arterial Hypertension, Peripheral Arterial Disease, Peripheral Venous Disease, Disease, Peripheral Venous Disease,  Type II Diabetes Type II Diabetes 09/30/2021 01/05/2022 [name]/A Date Acquired: 18 13 [name]/A Weeks of Treatment: Open Open [name]/A Wound Status: No No [name]/A Wound Recurrence: 3.5x3x0.1 3.5x2x0.1 [name]/A Measurements L x W x D (cm) 8.247 5.498 [name]/A A (cm) : rea 0.825 0.55 [name]/A Volume (cm) : 53.00% [name]/A [name]/A % Reduction in A rea: 84.30% [name]/A [name]/A % Reduction in Volume: Grade 2 Full Thickness Without Exposed [name]/A Classification: Support Structures Medium Medium [name]/A Exudate A mount: Serosanguineous Serosanguineous [name]/A Exudate Type: red, brown red, brown [name]/A Exudate Color: Distinct, outline attached Distinct, outline attached [name]/A Wound Margin: Large (67-100%) Small (1-33%) [name]/A Granulation A mount: Pink Red, Pink [name]/A Granulation Quality: Small (1-33%) Large (67-100%) [name]/A Necrotic A mount: Adherent Slough Eschar, Adherent Slough [name]/A Necrotic Tissue: Fat Layer (Subcutaneous Tissue): Yes Fat Layer (Subcutaneous Tissue): Yes [name]/A Exposed Structures: Fascia: No Fascia: No Tendon: No Tendon: No Muscle: No Muscle: No Joint: No Joint:  No Bone: No Bone: No Small (1-33%) Small (1-33%) [name]/A Epithelialization: Debridement - Selective/Open Wound Debridement - Selective/Open Wound [name]/A Debridement: Pre-procedure Verification/Time Out 09:22 09:22 [name]/A Taken: Lidocaine 4% Topical Solution Lidocaine 4% Topical Solution [name]/A Pain Control: Necrotic/Eschar, Other, Slough Other, Slough [name]/A Tissue Debrided: Non-Viable Tissue Non-Viable Tissue [name]/A Level: 10.5 7 [name]/A Debridement A (sq cm): rea Curette Curette [name]/A Instrument: Minimum Minimum [name]/A Bleeding: Pressure Pressure [name]/A Hemostasis A chieved: Procedure was tolerated well Procedure was tolerated well [name]/A Debridement Treatment Response: 3.5x3x0.1 3.5x2x0.1 [name]/A Post Debridement Measurements L x W x D (cm) 0.825 0.55 [name]/A Post Debridement Volume: (cm) Scarring: Yes Scarring: Yes [name]/A Periwound Skin Texture: Dry/Scaly: Yes No Abnormalities Noted [name]/A Periwound Skin Moisture: Rubor: Yes No Abnormalities Noted [name]/A Periwound Skin Color: No Abnormality No Abnormality [name]/A Temperature: Debridement Debridement [name]/A Procedures Performed: Treatment Notes Electronic Signature(s) Signed: 04/13/2022 9:38:35 AM By: [name] Entered By: Fredirick Maudlin on 04/13/2022 09:38:34 -------------------------------------------------------------------------------- Multi-Disciplinary Care Plan Details Patient Name: Date of Service: Michael [name], Michael LTER C. 04/13/2022 9:15 A M Medical Record Number: [id number] Patient Account Number: [id number] Date of Birth/Sex: Treating RN: 02-16-1951 (72 y.o. [name] Primary Care Ronasia Isola: [name] Other Clinician: Referring Daysha Ashmore: Treating Zamani Crocker/Extender: [name] in Treatment: [address], [place] ([id number]) [id number] Page 4 of 9 Nutrition Nursing Diagnoses: Potential for alteratiion in Nutrition/Potential for imbalanced  nutrition Goals: Patient/caregiver agrees to and verbalizes understanding of need to use nutritional supplements and/or vitamins as prescribed Date Initiated: 12/04/2021 Target Resolution Date: 05/29/2022 Goal Status: Active Patient/caregiver verbalizes understanding of need to maintain therapeutic glucose control per primary care physician Date Initiated: 12/04/2021 Target Resolution Date: 05/29/2022 Goal Status: Active Interventions: Assess patient nutrition upon admission and as needed per policy Provide education on elevated blood sugars and impact on wound healing Provide education on nutrition Treatment Activities: Education provided on Nutrition : 01/19/2022 Notes: Wound/Skin Impairment Nursing Diagnoses: Impaired tissue integrity Knowledge deficit related to ulceration/compromised skin integrity Goals: Patient/caregiver will verbalize understanding of skin care regimen Date Initiated: 12/04/2021 Date Inactivated: 02/02/2022 Target Resolution Date: 01/28/2022 Goal Status: Met Ulcer/skin breakdown will have a volume reduction of 30% by week 4 Date Initiated: 12/04/2021 Date Inactivated: 01/07/2022 Target Resolution Date: 12/26/2021 Goal Status: Met Ulcer/skin breakdown will have a volume reduction of 50% by week 8 Date Initiated: 01/07/2022 Date Inactivated: 03/16/2022 Target Resolution Date: 04/01/2022 Goal Status: Met Ulcer/skin breakdown will have a volume reduction of 80% by week 12 Date Initiated: 03/16/2022 Target Resolution Date: 05/29/2022 Goal Status: Active Interventions:  Assess ulceration(s) every visit Provide education on ulcer and skin care Notes: Electronic Signature(s) Signed: 04/13/2022 4:32:39 PM By: Adline Peals Entered By: Adline Peals on 04/13/2022 09:22:10 -------------------------------------------------------------------------------- Pain Assessment Details Patient Name: Date of Service: Michael [name], Michael [place] C. 04/13/2022 9:15 A M Medical  Record Number: [id number] Patient Account Number: [id number] Date of Birth/Sex: Treating RN: June 29, 1950 (72 y.o. [name] Primary Care Dezhane Staten: [name] Other Clinician: Referring Issam Carlyon: Treating Treasa Bradshaw/Extender: [name] in Treatment: 18 Active Problems Location of Pain Severity and Description of Pain Patient Has Paino Yes Site Locations Pain LocationSTARLING, SCHUMACKER ([id number]) [id number] Page 5 of 9 Pain Location: Pain in Ulcers Duration of the Pain. Constant / Intermittento Intermittent Rate the pain. Current Pain Level: 3 Character of Pain Describe the Pain: Aching Pain Management and Medication Current Pain Management: Electronic Signature(s) Signed: 04/13/2022 4:32:39 PM By: Adline Peals Entered By: Adline Peals on 04/13/2022 09:11:04 -------------------------------------------------------------------------------- Patient/Caregiver Education Details Patient Name: Date of Service: Michael [name], Michael LTER C. 2/12/2024andnbsp9:15 [place] Record Number: [id number] Patient Account Number: [id number] Date of Birth/Gender: Treating RN: [birth year] (72 y.o. [name] Primary Care Physician: [name] Other Clinician: Referring Physician: Treating Physician/Extender: [name] in Treatment: 18 Education Assessment Education Provided To: Patient Education Topics Provided Wound/Skin Impairment: Methods: Explain/Verbal Responses: Reinforcements needed, State content correctly Electronic Signature(s) Signed: 04/13/2022 4:32:39 PM By: Adline Peals Entered By: Adline Peals on 04/13/2022 09:22:22 -------------------------------------------------------------------------------- Wound Assessment Details Patient Name: Date of Service: Michael [name], Michael C. 04/13/2022 9:15 A M Medical Record Number: [id number] Patient Account Number:  [id number] NIVEK, DUPUY ([id number]) [phone].pdf Page 6 of 9 Date of Birth/Sex: Treating RN: May 27, 1950 (72 y.o. [name] Primary Care Hena Ewalt: Other Clinician: [name] Referring Ollie Esty: Treating Rosene Pilling/Extender: [name] in Treatment: 18 Wound Status Wound Number: 3 Primary Diabetic Wound/Ulcer of the Lower Extremity Etiology: Wound Location: Right, Anterior Lower Leg Wound Open Wounding Event: Blister Status: Date Acquired: 09/30/2021 Comorbid Chronic sinus problems/congestion, Hypertension, Peripheral Weeks Of Treatment: 18 History: Arterial Disease, Peripheral Venous Disease, Type II Diabetes Clustered Wound: No Photos Wound Measurements Length: (cm) 3.5 Width: (cm) 3 Depth: (cm) 0.1 Area: (cm) 8.247 Volume: (cm) 0.825 % Reduction in Area: 53% % Reduction in Volume: 84.3% Epithelialization: Small (1-33%) Tunneling: No Undermining: No Wound Description Classification: Grade 2 Wound Margin: Distinct, outline attached Exudate Amount: Medium Exudate Type: Serosanguineous Exudate Color: red, brown Foul Odor After Cleansing: No Slough/Fibrino Yes Wound Bed Granulation Amount: Large (67-100%) Exposed Structure Granulation Quality: Pink Fascia Exposed: No Necrotic Amount: Small (1-33%) Fat Layer (Subcutaneous Tissue) Exposed: Yes Necrotic Quality: Adherent Slough Tendon Exposed: No Muscle Exposed: No Joint Exposed: No Bone Exposed: No Periwound Skin Texture Texture Color No Abnormalities Noted: No No Abnormalities Noted: No Scarring: Yes Rubor: Yes Moisture Temperature / Pain No Abnormalities Noted: No Temperature: No Abnormality Dry / Scaly: Yes Treatment Notes Wound #3 (Lower Leg) Wound Laterality: Right, Anterior Cleanser Soap and Water Discharge Instruction: May shower and wash wound with dial antibacterial soap and water prior to dressing change. Wound  Cleanser Discharge Instruction: Cleanse the wound with wound cleanser prior to applying a clean dressing using gauze sponges, not tissue or cotton balls. Peri-Wound Care Sween Lotion (Moisturizing lotion) Discharge Instruction: Apply moisturizing lotion as directed DALONTA, BIERNACKI ([id number]) [id number] Page 7 of 9 Topical Primary Dressing Endoform 2x2 in Discharge Instruction: Moisten with saline Secondary Dressing ABD Pad, 8x10 Discharge  Instruction: Apply over primary dressing as directed. Woven Gauze Sponge, Non-Sterile 4x4 in Discharge Instruction: Apply over primary dressing as directed. Secured With [employer] Size 5, 10 (yds) Compression Wrap Kerlix Roll 4.5x3.1 (in/yd) Discharge Instruction: Apply Kerlix and Coban compression as directed. Coban Self-Adherent Wrap 4x5 (in/yd) Discharge Instruction: Apply over Kerlix as directed. Unnaboot w/Calamine, 4x10 (in/yd) Discharge Instruction: Apply Unnaboot to top of dressing and around ankles to prevent dressing from sliding Compression Stockings Add-Ons Electronic Signature(s) Signed: 04/13/2022 4:32:39 PM By: Adline Peals Entered By: Adline Peals on 04/13/2022 09:17:53 -------------------------------------------------------------------------------- Wound Assessment Details Patient Name: Date of Service: Michael [name], Michael C. 04/13/2022 9:15 A M Medical Record Number: [id number] Patient Account Number: [id number] Date of Birth/Sex: Treating RN: [birth year] (72 y.o. [name] Primary Care Wataru Mccowen: [name] Other Clinician: Referring Chucky Homes: Treating Raynah Gomes/Extender: [name] in Treatment: 18 Wound Status Wound Number: 4 Primary Dehisced Wound Etiology: Wound Location: Right, Medial Upper Leg Wound Open Wounding Event: Gradually Appeared Status: Date Acquired: 01/05/2022 Comorbid Chronic sinus problems/congestion, Hypertension,  Peripheral Weeks Of Treatment: 13 History: Arterial Disease, Peripheral Venous Disease, Type II Diabetes Clustered Wound: No Wound Measurements Length: (cm) 3.5 Width: (cm) 2 Depth: (cm) 0.1 Area: (cm) 5.498 Volume: (cm) 0.55 % Reduction in Area: % Reduction in Volume: Epithelialization: Small (1-33%) Tunneling: No Undermining: No Wound Description Classification: Full Thickness Without Exposed Support Structures Wound Margin: Distinct, outline attached Exudate Amount: Medium Exudate Type: Serosanguineous Exudate Color: red, brown Foul Odor After Cleansing: No Slough/Fibrino Yes Wound Bed Granulation Amount: Small (1-33%) Exposed Structure Granulation Quality: Red, Pink Fascia Exposed: No Necrotic Amount: Large (67-100%) Fat Layer (Subcutaneous Tissue) Exposed: Yes JOLEN, BARDI ([id number].pdf Page 8 of 9 Necrotic Quality: Eschar, Adherent Slough Tendon Exposed: No Muscle Exposed: No Joint Exposed: No Bone Exposed: No Periwound Skin Texture Texture Color No Abnormalities Noted: No No Abnormalities Noted: Yes Scarring: Yes Temperature / Pain Temperature: No Abnormality Moisture No Abnormalities Noted: Yes Treatment Notes Wound #4 (Upper Leg) Wound Laterality: Right, Medial Cleanser Soap and Water Discharge Instruction: May shower and wash wound with dial antibacterial soap and water prior to dressing change. Wound Cleanser Discharge Instruction: Cleanse the wound with wound cleanser prior to applying a clean dressing using gauze sponges, not tissue or cotton balls. Peri-Wound Care Topical Primary Dressing Endoform 2x2 in Discharge Instruction: Moisten with saline Secondary Dressing Zetuvit Plus Silicone Border Dressing 4x4 (in/in) Discharge Instruction: Apply silicone border over primary dressing as directed. Secured With Compression Wrap Compression Stockings Environmental education officer) Signed: 04/13/2022  4:32:39 PM By: Adline Peals Entered By: Adline Peals on 04/13/2022 09:29:29 -------------------------------------------------------------------------------- Vitals Details Patient Name: Date of Service: Michael [name], Michael LTER C. 04/13/2022 9:15 A M Medical Record Number: [id number] Patient Account Number: [id number] Date of Birth/Sex: Treating RN: 1950-05-26 (72 y.o. [name] Primary Care Jetta Murray: [name] Other Clinician: Referring Bowen Kia: Treating Kineta Fudala/Extender: [name] in Treatment: 18 Vital Signs Time Taken: 09:10 Temperature (F): 98.4 Height (in): 72 Pulse (bpm): 114 Weight (lbs): 208 Respiratory Rate (breaths/min): 18 Body Mass Index (BMI): 28.2 Blood Pressure (mmHg): 160/72 Capillary Blood Glucose (mg/dl): 92 Reference Range: 80 - 120 mg / dl Electronic Signature(s) Signed: 04/13/2022 4:32:39 PM By: Romero Liner ([id number] Page 9 of 9 Entered By: Adline Peals on 04/13/2022 09:11:21

## 2022-04-20 ENCOUNTER — Encounter (HOSPITAL_BASED_OUTPATIENT_CLINIC_OR_DEPARTMENT_OTHER): Payer: Medicare Other | Admitting: General Surgery

## 2022-04-20 DIAGNOSIS — E669 Obesity, unspecified: Secondary | ICD-10-CM | POA: Diagnosis not present

## 2022-04-20 DIAGNOSIS — E1122 Type 2 diabetes mellitus with diabetic chronic kidney disease: Secondary | ICD-10-CM | POA: Diagnosis not present

## 2022-04-20 DIAGNOSIS — L97122 Non-pressure chronic ulcer of left thigh with fat layer exposed: Secondary | ICD-10-CM | POA: Diagnosis not present

## 2022-04-20 DIAGNOSIS — E1151 Type 2 diabetes mellitus with diabetic peripheral angiopathy without gangrene: Secondary | ICD-10-CM | POA: Diagnosis not present

## 2022-04-20 DIAGNOSIS — E785 Hyperlipidemia, unspecified: Secondary | ICD-10-CM | POA: Diagnosis not present

## 2022-04-20 DIAGNOSIS — I129 Hypertensive chronic kidney disease with stage 1 through stage 4 chronic kidney disease, or unspecified chronic kidney disease: Secondary | ICD-10-CM | POA: Diagnosis not present

## 2022-04-20 DIAGNOSIS — I70239 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: Secondary | ICD-10-CM | POA: Diagnosis not present

## 2022-04-20 DIAGNOSIS — T8131XA Disruption of external operation (surgical) wound, not elsewhere classified, initial encounter: Secondary | ICD-10-CM | POA: Diagnosis not present

## 2022-04-20 DIAGNOSIS — E11622 Type 2 diabetes mellitus with other skin ulcer: Secondary | ICD-10-CM | POA: Diagnosis not present

## 2022-04-20 DIAGNOSIS — L97815 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: Secondary | ICD-10-CM | POA: Diagnosis not present

## 2022-04-20 DIAGNOSIS — N189 Chronic kidney disease, unspecified: Secondary | ICD-10-CM | POA: Diagnosis not present

## 2022-04-20 DIAGNOSIS — L97812 Non-pressure chronic ulcer of other part of right lower leg with fat layer exposed: Secondary | ICD-10-CM | POA: Diagnosis not present

## 2022-04-23 NOTE — Progress Notes (Signed)
Michael [name], Michael [name] ([id number]) [id number] Page 1 of 11 Visit Report for 04/20/2022 Chief Complaint Document Details Patient Name: Date of Service: Michael FFMA [place], [name] 04/20/2022 9:15 A M Medical Record Number: [id number] Patient Account Number: [id number] Date of Birth/Sex: Treating RN: [birth year] (72 y.o. M) Primary Care Provider: [name] Other Clinician: Referring Provider: Treating Provider/Extender: [name] in Treatment: 73 Information Obtained from: Patient Chief Complaint Patient presents to the wound care center today with an open arterial ulcer to the right lower extremity in the setting of diabetes mellitus. he has had this problem for 7 months 12/04/2021: ulcer to right lower anterior tibial surface Electronic Signature(s) Signed: 04/20/2022 9:46:14 AM By: [name] Entered By: Fredirick Maudlin on 04/20/2022 09:46:14 -------------------------------------------------------------------------------- Debridement Details Patient Name: Date of Service: Michael [name], Michael LTER [name]. 04/20/2022 9:15 A M Medical Record Number: [id number] Patient Account Number: [id number] Date of Birth/Sex: Treating RN: March 14, 1950 (72 y.o. [name] Primary Care Provider: [name] Other Clinician: Referring Provider: Treating Provider/Extender: [name] in Treatment: 19 Debridement Performed for Assessment: Wound #3 Right,Anterior Lower Leg Performed By: Physician Fredirick Maudlin, MD Debridement Type: Debridement Severity of Tissue Pre Debridement: Fat layer exposed Level of Consciousness (Pre-procedure): Awake and Alert Pre-procedure Verification/Time Out Yes - 09:29 Taken: Start Time: 09:30 T Area Debrided (L x W): otal 2 (cm) x 2 (cm) = 4 (cm) Tissue and other material debrided: Non-Viable, Slough, Slough Level: Non-Viable Tissue Debridement Description: Selective/Open  Wound Instrument: Curette Bleeding: Minimum Hemostasis Achieved: Pressure Response to Treatment: Procedure was tolerated well Level of Consciousness (Post- Awake and Alert procedure): Post Debridement Measurements of Total Wound Length: (cm) 2 Width: (cm) 2 Depth: (cm) 0.1 Volume: (cm) 0.314 Character of Wound/Ulcer Post Debridement: Requires Further Debridement Severity of Tissue Post Debridement: Fat layer exposed Michael [name], Michael [name] ([id number]) [phone].pdf Page 2 of 11 Post Procedure Diagnosis Same as Pre-procedure Notes Scribed for Dr. Celine Ahr by Blanche East, RN Electronic Signature(s) Signed: 04/20/2022 11:21:45 AM By: [name] Signed: 04/22/2022 4:23:23 PM By: [name] Entered By: Blanche East on 04/20/2022 09:31:09 -------------------------------------------------------------------------------- Debridement Details Patient Name: Date of Service: Michael [name], Michael LTER [name]. 04/20/2022 9:15 A M Medical Record Number: [id number] Patient Account Number: [id number] Date of Birth/Sex: Treating RN: Sep 05, 1950 (72 y.o. [name] Primary Care Provider: [name] Other Clinician: Referring Provider: Treating Provider/Extender: [name] in Treatment: 19 Debridement Performed for Assessment: Wound #4 Right,Medial Upper Leg Performed By: Physician Fredirick Maudlin, MD Debridement Type: Debridement Level of Consciousness (Pre-procedure): Awake and Alert Pre-procedure Verification/Time Out Yes - 09:29 Taken: Start Time: 09:30 T Area Debrided (L x W): otal 2 (cm) x 1.5 (cm) = 3 (cm) Tissue and other material debrided: Non-Viable, Slough, Slough Level: Non-Viable Tissue Debridement Description: Selective/Open Wound Instrument: Curette Bleeding: Minimum Hemostasis Achieved: Pressure Response to Treatment: Procedure was tolerated well Level of Consciousness (Post- Awake and  Alert procedure): Post Debridement Measurements of Total Wound Length: (cm) 2 Width: (cm) 1.5 Depth: (cm) 0.1 Volume: (cm) 0.236 Character of Wound/Ulcer Post Debridement: Requires Further Debridement Post Procedure Diagnosis Same as Pre-procedure Notes Scribed for Dr. Celine Ahr by Blanche East, RN Electronic Signature(s) Signed: 04/20/2022 11:21:45 AM By: [name] Signed: 04/22/2022 4:23:23 PM By: [name] Entered By: Blanche East on 04/20/2022 09:32:00 -------------------------------------------------------------------------------- HPI Details Patient Name: Date of Service: Michael [name], Michael LTER [name]. 04/20/2022 9:15 A M Michael [name], Michael [name] (  [id number] Page 3 of 11 Medical Record Number: [id number] Patient Account Number: [id number] Date of Birth/Sex: Treating RN: 02/15/1951 (72 y.o. M) Primary Care Provider: [name] Other Clinician: Referring Provider: Treating Provider/Extender: [name] in Treatment: 19 History of Present Illness Location: right lateral calf closer to the knee Quality: Patient reports experiencing a dull pain to affected area(s). Severity: Patient states wound(s) are getting worse. Duration: Patient has had the wound for > 7 months prior to seeking treatment at the wound center Timing: Pain in wound is constant (hurts all the time) Context: The wound would happen gradually Modifying Factors: Patient wound(s)/ulcer(s) are worsening due to : no resolution and a white material at the base of the wound ssociated Signs and Symptoms: Patient reports having:no discharge or purulent material A HPI Description: 72 year old gentleman who has been referred to was from his PCP for a chronic ulceration on his right lower extremity which she's had for several weeks. Past medical history significant for diabetes mellitus type 2, hypertension, hyperlipidemia, anxiety, obesity,  peripheral vascular disease and chronic kidney disease. He has never been a smoker. Most recent lab work done at his PCPs office showed a glucose of 217 milligrams per deciliter which is consistent with hyperglycemia. In January 2017 recent hemoglobin A1c values noted to be 8.2%. In April 2017, he has been seen at the vascular office by Dr. Trula Slade and Dr. Kellie Simmering for right leg claudication. He had a right superficial femoral artery stent in September 2012. Recent noninvasive vascular imaging done on 06/24/2015 showed a right ABI of 1.07 with a triphasic waveform and a right TBI of 0.81. The left was noncompressible with a triphasic waveform and a TBI of 1.17. Right lower extremity arterial duplex was unable to identify stent exit but they were elevated velocities at the stent and in the distal thigh with triphasic waveforms. Dr. Stephens Shire opinion was to return to the clinic in 6 months with ABI and right lower extremity arterial duplex studies to be done. He has seen a dermatologist who had done a biopsy and said it was benign and he was given a steroid cream. 10/23/2015 -- Pathology report of the biopsy done in April 2017 shows ulcer with underlying angiodermatitis consistent with stasis dermatitis. 10/30/2015 -- he is going for shoulder surgery in about 10 days' time and was asking about the perioperative care. His blood sugars are running in the high 100s or low 200s. No hemoglobin A1c done recently. 11/20/2015 -- is back up to 2 weeks because of recent left shoulder surgery. 12/04/15; patient returns today with the wound for the most part looking healthy. No evidence of infection no debridement required. He is using Prisma for 4 weeks, without obvious improvement per her intake nurse. This started as several small open areas that were raised erythematous. He saw dermatology and at some point this was biopsied that just suggested stasis skin physiology. This certainly doesn't look like  that 12/11/15; using Hydrafera Blue. Previous biopsy reviewed, no atypia PAS negative. He has a history of stents in the right leg however he does not appear to have primary arterial insufficiency ABI in this clinic was over 0.9. 12/25/2015 -- he has been approved for grafix and we will order for some to be applied next week 01/01/2016 -- he has had his first application of Grafix today 01/08/2016 -- he has had his second application of Grafix today READMISSION 12/04/2021 This is a now 72 year old man who was followed in our clinic about  5 years ago for an ulceration on his right lateral lower leg, just distal to the knee. He has since undergone a number of revascularization procedures that have been complicated by restenosis and wound infections. During the course of this treatment, he developed a small ulcer on his right anterior tibial surface. Despite use of an Unna boot, the wound has continued to expand. He was referred to the wound care center by Dr. Trula Slade for further evaluation and management. On the right anterior tibial surface, there is an irregular wound with heavy slough and eschar accumulation. The periwound skin is intact but he does have 1-2+ pitting edema. There is no purulent drainage or malodor. 10/13; second visit for this man who has an ischemic wound in the setting of type 2 diabetes on the right anterior mid tibia area. He has been revascularized. Using Santyl gent 12/22/2021: The wound is about the same size this week. There is a lot of gray sloughy material on the surface, secondary to silver nitrate used to stop bleeding after what sounds like a fairly aggressive debridement last week. 12/30/2021: The wound is smaller this week and a bit cleaner. Edema control is excellent. There is still a bit of slough accumulation. 01/07/2022: The wound continues to contract and is much cleaner this week. Edema control is very good. He has small openings in his upper leg surgical  scars, but he is going to be seeing Dr. Trula Slade next Monday and will have him take a look. He has been applying manuka honey to both of the sites. We have been using Santyl and gentamicin on his lower leg wound. 01/14/2022: He saw vascular surgery this week and was told that they were happy to have [place] manage the 2 wounds from his operation. Both of these have a light layer of slough on the surface. The more distal wound is a little bit dry. The anterior tibial wound continues to accumulate a fair amount of slough. Edema control is good. 01/19/2022: Both upper leg wounds are smaller. The more distal is quite dry and cracked when I was examining it. The lower leg wound is more painful and the surrounding tissue is erythematous and indurated. 01/26/2022: The upper leg wounds are healed. The skin at the distal leg wound is quite dry. The lower leg wound is cleaner but still fairly painful. His wrap slid again this week. 02/02/2022: We had good success keeping his wrap intact using a standard Unna boot. The lower leg wound continues to contract and is clean and less painful this week. 12/11; TheraSkin #1 12/18; TheraSkin #2. Wound looks as though it is contracted. Patient states that his pain was better in the leg. Secondary dressing and kerlix Coban 02/24/2022: The wound is smaller since I saw it last. It is less painful and there is good granulation tissue, including over the exposed tendon. 03/09/2022: The wound continues to contract. There is continued fill of the wound cavity with granulation tissue. 03/16/2022: The wound is smaller again today. There is good granulation tissue forming, covering the tendon. Michael [name], Michael [name] ([id number]) [id number] Page 4 of 11 03/30/2022: The wound continues to contract and fill with good granulation tissue. The granulation tissue is oozing a little bit at the lateral edge. 04/13/2022: We left his TheraSkin on an extra week because it was  looking so good. The wound is smaller this week and flush with the surrounding skin. His medial thigh wound has opened up again. There is slough and eschar present. 04/20/2022: The  anterior tibial wound continues to contract. There is good granulation tissue with just a light layer of slough. The medial thigh ulcer is superficial with slough present. It is a bit fibrotic due to being in a scar. Electronic Signature(s) Signed: 04/20/2022 9:47:06 AM By: [name] Entered By: Fredirick Maudlin on 04/20/2022 09:47:06 -------------------------------------------------------------------------------- Physical Exam Details Patient Name: Date of Service: Michael [name], Michael LTER [name]. 04/20/2022 9:15 A M Medical Record Number: [id number] Patient Account Number: [id number] Date of Birth/Sex: Treating RN: September 04, 1950 (72 y.o. M) Primary Care Provider: [name] Other Clinician: Referring Provider: Treating Provider/Extender: [name] in Treatment: 19 Constitutional Slightly hypertensive. . . . no acute distress. Respiratory Normal work of breathing on room air. Notes 04/20/2022: The anterior tibial wound continues to contract. There is good granulation tissue with just a light layer of slough. The medial thigh ulcer is superficial with slough present. It is a bit fibrotic due to being in a scar. Electronic Signature(s) Signed: 04/20/2022 9:47:42 AM By: [name] Entered By: Fredirick Maudlin on 04/20/2022 09:47:42 -------------------------------------------------------------------------------- Physician Orders Details Patient Name: Date of Service: Michael [name], Michael LTER [name]. 04/20/2022 9:15 A M Medical Record Number: [id number] Patient Account Number: [id number] Date of Birth/Sex: Treating RN: [birth year] (73 y.o. [name] Primary Care Provider: [name] Other Clinician: Referring Provider: Treating Provider/Extender: [name] in Treatment: 30 Verbal / Phone Orders: No Diagnosis Coding ICD-10 Coding Code Description L97.815 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 I70.239 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 E11.622 Type 2 diabetes mellitus with other skin ulcer I73.9 Peripheral vascular disease, unspecified L97.122 Non-pressure chronic ulcer of left thigh with fat layer exposed Follow-up Appointments ppointment in 1 week. - Dr. Celine Ahr - room 4 Return A Michael [name], Michael [name] ([id number]) [id number].pdf Page 5 of 11 Anesthetic Wound #3 Right,Anterior Lower Leg (In clinic) Topical Lidocaine 4% applied to wound bed Edema Control - Lymphedema / SCD / Other Right Lower Extremity Avoid standing for long periods of time. Wound Treatment Wound #3 - Lower Leg Wound Laterality: Right, Anterior Cleanser: Soap and Water 1 x Per Week/30 Days Discharge Instructions: May shower and wash wound with dial antibacterial soap and water prior to dressing change. Cleanser: Wound Cleanser 1 x Per Week/30 Days Discharge Instructions: Cleanse the wound with wound cleanser prior to applying a clean dressing using gauze sponges, not tissue or cotton balls. Peri-Wound Care: Sween Lotion (Moisturizing lotion) 1 x Per Week/30 Days Discharge Instructions: Apply moisturizing lotion as directed Prim Dressing: Endoform 2x2 in 1 x Per Week/30 Days ary Discharge Instructions: Moisten with saline Secondary Dressing: ABD Pad, 8x10 1 x Per Week/30 Days Discharge Instructions: Apply over primary dressing as directed. Secondary Dressing: Woven Gauze Sponge, Non-Sterile 4x4 in 1 x Per Week/30 Days Discharge Instructions: Apply over primary dressing as directed. Secured With: [employer] Size 5, 10 (yds) 1 x Per Week/30 Days Compression Wrap: Kerlix Roll 4.5x3.1 (in/yd) 1 x Per  Week/30 Days Discharge Instructions: Apply Kerlix and Coban compression as directed. Compression Wrap: Coban Self-Adherent Wrap 4x5 (in/yd) 1 x Per Week/30 Days Discharge Instructions: Apply over Kerlix as directed. Compression Wrap: Unnaboot w/Calamine, 4x10 (in/yd) 1 x Per Week/30 Days Discharge Instructions: Apply Unnaboot to top of dressing and around ankles to prevent dressing from sliding Wound #4 - Upper Leg Wound Laterality: Right, Medial Cleanser: Soap and  Water 1 x Per Week/30 Days Discharge Instructions: May shower and wash wound with dial antibacterial soap and water prior to dressing change. Cleanser: Wound Cleanser 1 x Per Week/30 Days Discharge Instructions: Cleanse the wound with wound cleanser prior to applying a clean dressing using gauze sponges, not tissue or cotton balls. Prim Dressing: Endoform 2x2 in 1 x Per Week/30 Days ary Discharge Instructions: Moisten with saline Secondary Dressing: Zetuvit Plus Silicone Border Dressing 4x4 (in/in) 1 x Per Week/30 Days Discharge Instructions: Apply silicone border over primary dressing as directed. Electronic Signature(s) Signed: 04/20/2022 11:21:45 AM By: [name] Entered By: Fredirick Maudlin on 04/20/2022 09:48:05 -------------------------------------------------------------------------------- Problem List Details Patient Name: Date of Service: Michael [name], Michael LTER [name]. 04/20/2022 9:15 A M Medical Record Number: [id number] Patient Account Number: [id number] Date of Birth/Sex: Treating RN: May 26, 1950 (72 y.o. M) Primary Care Provider: [name] Other Clinician: Referring Provider: Treating Provider/Extender: [name] in Treatment: [address], [place] [name] ([id number]) [id number] Page 6 of 11 Active Problems ICD-10 Encounter Code Description Active Date MDM Diagnosis L97.815 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  12/04/2021 No Yes involvement without evidence of necrosis I70.239 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 12/04/2021 No Yes E11.622 Type 2 diabetes mellitus with other skin ulcer 12/04/2021 No Yes I73.9 Peripheral vascular disease, unspecified 12/04/2021 No Yes L97.122 Non-pressure chronic ulcer of left thigh with fat layer exposed 01/14/2022 No Yes Inactive Problems Resolved Problems Electronic Signature(s) Signed: 04/20/2022 9:42:18 AM By: [name] Entered By: Fredirick Maudlin on 04/20/2022 09:42:18 -------------------------------------------------------------------------------- Progress Note Details Patient Name: Date of Service: Michael [name], Michael LTER [name]. 04/20/2022 9:15 A M Medical Record Number: [id number] Patient Account Number: [id number] Date of Birth/Sex: Treating RN: 1950/10/25 (72 y.o. M) Primary Care Provider: [name] Other Clinician: Referring Provider: Treating Provider/Extender: [name] in Treatment: 64 Subjective Chief Complaint Information obtained from Patient Patient presents to the wound care center today with an open arterial ulcer to the right lower extremity in the setting of diabetes mellitus. he has had this problem for 7 months 12/04/2021: ulcer to right lower anterior tibial surface History of Present Illness (HPI) The following HPI elements were documented for the patient's wound: Location: right lateral calf closer to the knee Quality: Patient reports experiencing a dull pain to affected area(s). Severity: Patient states wound(s) are getting worse. Duration: Patient has had the wound for > 7 months prior to seeking treatment at the wound center Timing: Pain in wound is constant (hurts all the time) Context: The wound would happen gradually Modifying Factors: Patient wound(s)/ulcer(s) are worsening due to : no resolution and a white material at the base of the  wound Associated Signs and Symptoms: Patient reports having:no discharge or purulent material 72 year old gentleman who has been referred to was from his PCP for a chronic ulceration on his right lower extremity which she's had for several weeks. Past medical history significant for diabetes mellitus type 2, hypertension, hyperlipidemia, anxiety, obesity, peripheral vascular disease and chronic kidney disease. He has never been a smoker. Most recent lab work done at his PCPs office showed a glucose of 217 milligrams per deciliter which is consistent with hyperglycemia. In January 2017 recent hemoglobin A1c values noted to be 8.2%. In April 2017, he has been seen at the vascular office by Dr. Trula Slade and Dr. Kellie Simmering for right leg claudication. He had a right superficial femoral  artery stent in September 2012. Recent noninvasive vascular imaging done on 06/24/2015 showed a right ABI of 1.07 with a triphasic waveform and a right TBI of 0.81. The left was noncompressible with a triphasic waveform and a TBI of 1.17. Right lower extremity arterial duplex was unable to identify stent exit but they were Michael [name], Michael [name] ([id number]) [phone].pdf Page 7 of 11 elevated velocities at the stent and in the distal thigh with triphasic waveforms. Dr. Stephens Shire opinion was to return to the clinic in 6 months with ABI and right lower extremity arterial duplex studies to be done. He has seen a dermatologist who had done a biopsy and said it was benign and he was given a steroid cream. 10/23/2015 -- Pathology report of the biopsy done in April 2017 shows ulcer with underlying angiodermatitis consistent with stasis dermatitis. 10/30/2015 -- he is going for shoulder surgery in about 10 days' time and was asking about the perioperative care. His blood sugars are running in the high 100s or low 200s. No hemoglobin A1c done recently. 11/20/2015 -- is back up to 2 weeks because of recent left  shoulder surgery. 12/04/15; patient returns today with the wound for the most part looking healthy. No evidence of infection no debridement required. He is using Prisma for 4 weeks, without obvious improvement per her intake nurse. This started as several small open areas that were raised erythematous. He saw dermatology and at some point this was biopsied that just suggested stasis skin physiology. This certainly doesn't look like that 12/11/15; using Hydrafera Blue. Previous biopsy reviewed, no atypia PAS negative. He has a history of stents in the right leg however he does not appear to have primary arterial insufficiency ABI in this clinic was over 0.9. 12/25/2015 -- he has been approved for grafix and we will order for some to be applied next week 01/01/2016 -- he has had his first application of Grafix today 01/08/2016 -- he has had his second application of Grafix today READMISSION 12/04/2021 This is a now 72 year old man who was followed in our clinic about 5 years ago for an ulceration on his right lateral lower leg, just distal to the knee. He has since undergone a number of revascularization procedures that have been complicated by restenosis and wound infections. During the course of this treatment, he developed a small ulcer on his right anterior tibial surface. Despite use of an Unna boot, the wound has continued to expand. He was referred to the wound care center by Dr. Trula Slade for further evaluation and management. On the right anterior tibial surface, there is an irregular wound with heavy slough and eschar accumulation. The periwound skin is intact but he does have 1-2+ pitting edema. There is no purulent drainage or malodor. 10/13; second visit for this man who has an ischemic wound in the setting of type 2 diabetes on the right anterior mid tibia area. He has been revascularized. Using Santyl gent 12/22/2021: The wound is about the same size this week. There is a lot of gray  sloughy material on the surface, secondary to silver nitrate used to stop bleeding after what sounds like a fairly aggressive debridement last week. 12/30/2021: The wound is smaller this week and a bit cleaner. Edema control is excellent. There is still a bit of slough accumulation. 01/07/2022: The wound continues to contract and is much cleaner this week. Edema control is very good. He has small openings in his upper leg surgical scars, but he is going to be  seeing Dr. Trula Slade next Monday and will have him take a look. He has been applying manuka honey to both of the sites. We have been using Santyl and gentamicin on his lower leg wound. 01/14/2022: He saw vascular surgery this week and was told that they were happy to have [place] manage the 2 wounds from his operation. Both of these have a light layer of slough on the surface. The more distal wound is a little bit dry. The anterior tibial wound continues to accumulate a fair amount of slough. Edema control is good. 01/19/2022: Both upper leg wounds are smaller. The more distal is quite dry and cracked when I was examining it. The lower leg wound is more painful and the surrounding tissue is erythematous and indurated. 01/26/2022: The upper leg wounds are healed. The skin at the distal leg wound is quite dry. The lower leg wound is cleaner but still fairly painful. His wrap slid again this week. 02/02/2022: We had good success keeping his wrap intact using a standard Unna boot. The lower leg wound continues to contract and is clean and less painful this week. 12/11; TheraSkin #1 12/18; TheraSkin #2. Wound looks as though it is contracted. Patient states that his pain was better in the leg. Secondary dressing and kerlix Coban 02/24/2022: The wound is smaller since I saw it last. It is less painful and there is good granulation tissue, including over the exposed tendon. 03/09/2022: The wound continues to contract. There is continued fill of the wound cavity  with granulation tissue. 03/16/2022: The wound is smaller again today. There is good granulation tissue forming, covering the tendon. 03/30/2022: The wound continues to contract and fill with good granulation tissue. The granulation tissue is oozing a little bit at the lateral edge. 04/13/2022: We left his TheraSkin on an extra week because it was looking so good. The wound is smaller this week and flush with the surrounding skin. His medial thigh wound has opened up again. There is slough and eschar present. 04/20/2022: The anterior tibial wound continues to contract. There is good granulation tissue with just a light layer of slough. The medial thigh ulcer is superficial with slough present. It is a bit fibrotic due to being in a scar. Patient History Information obtained from Patient. Family History Cancer - Siblings, Heart Disease - Father, Hypertension - Father,Mother, No family history of Hereditary Spherocytosis, Kidney Disease, Seizures, Stroke, Thyroid Problems, Tuberculosis. Social History Former smoker - smokeless tobacco, Marital Status - Married, Alcohol Use - Rarely - WINE, Drug Use - No History, Caffeine Use - Daily - COFFEE. Medical History Ear/Nose/Mouth/Throat Patient has history of Chronic sinus problems/congestion - seasonal allergies Cardiovascular Patient has history of Hypertension, Peripheral Arterial Disease - STENTS, Peripheral Venous Disease Endocrine Patient has history of Type II Diabetes - last A1c- 8.2 Klomp, Alper [name] ([id number]) [phone].pdf Page 8 of 11 Hospitalization/Surgery History - left shoulder surgery. Medical A Surgical History Notes nd Constitutional Symptoms (General Health) obesity , h/o (R) leg claudication (right fem stent September 2012) Cardiovascular hyperlipidemia Endocrine pt. on diet intentionally losing 20 pounds since December 2016 in an effort to improve A1C levels Integumentary (Skin) psoriatic  arthritis Musculoskeletal bilat knee pain identified as an ortho issue psoriatic arthritis Oncologic Prostate cancer 2018 Psychiatric anxiety Objective Constitutional Slightly hypertensive. no acute distress. Vitals Time Taken: 9:15 AM, Height: 72 in, Weight: 208 lbs, BMI: 28.2, Temperature: 97.6 F, Pulse: 96 bpm, Respiratory Rate: 18 breaths/min, Blood Pressure: 147/55 mmHg, Capillary Blood Glucose: 76 mg/dl.  Respiratory Normal work of breathing on room air. General Notes: 04/20/2022: The anterior tibial wound continues to contract. There is good granulation tissue with just a light layer of slough. The medial thigh ulcer is superficial with slough present. It is a bit fibrotic due to being in a scar. Integumentary (Hair, Skin) Wound #3 status is Open. Original cause of wound was Blister. The date acquired was: 09/30/2021. The wound has been in treatment 19 weeks. The wound is located on the Right,Anterior Lower Leg. The wound measures 2cm length x 2cm width x 0.1cm depth; 3.142cm^2 area and 0.314cm^3 volume. There is Fat Layer (Subcutaneous Tissue) exposed. There is no tunneling or undermining noted. There is a medium amount of serosanguineous drainage noted. The wound margin is distinct with the outline attached to the wound base. There is large (67-100%) pink granulation within the wound bed. There is a small (1-33%) amount of necrotic tissue within the wound bed including Adherent Slough. The periwound skin appearance exhibited: Scarring, Dry/Scaly, Rubor. Periwound temperature was noted as No Abnormality. Wound #4 status is Open. Original cause of wound was Gradually Appeared. The date acquired was: 01/05/2022. The wound has been in treatment 14 weeks. The wound is located on the Right,Medial Upper Leg. The wound measures 2cm length x 1.5cm width x 0.1cm depth; 2.356cm^2 area and 0.236cm^3 volume. There is Fat Layer (Subcutaneous Tissue) exposed. There is no tunneling or undermining  noted. There is a medium amount of serosanguineous drainage noted. The wound margin is distinct with the outline attached to the wound base. There is small (1-33%) red, pink granulation within the wound bed. There is a large (67- 100%) amount of necrotic tissue within the wound bed including Eschar and Adherent Slough. The periwound skin appearance had no abnormalities noted for moisture. The periwound skin appearance had no abnormalities noted for color. The periwound skin appearance exhibited: Scarring. Periwound temperature was noted as No Abnormality. 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 There was a Selective/Open Wound Non-Viable Tissue Debridement with a total area of 4 sq cm performed by Fredirick Maudlin, MD. With the following instrument(s): Curette to remove Non-Viable tissue/material. Material removed includes [facility]. No specimens were taken. A time out was conducted at 09:29, prior to the start of the procedure. A Minimum amount of bleeding was controlled with Pressure. The procedure was tolerated well. Post Debridement Measurements: 2cm length x 2cm width x 0.1cm depth; 0.314cm^3 volume. Michael [name], Michael [name] ([id number]) [id number] Page 9 of 11 Character of Wound/Ulcer Post Debridement requires further debridement. Severity of Tissue Post Debridement is: Fat layer exposed. Post procedure Diagnosis Wound #3: Same as  Pre-Procedure General Notes: Scribed for Dr. Celine Ahr by Blanche East, RN. Wound #4 Pre-procedure diagnosis of Wound #4 is a Dehisced Wound located on the Right,Medial Upper Leg . There was a Selective/Open Wound Non-Viable Tissue Debridement with a total area of 3 sq cm performed by Fredirick Maudlin, MD. With the following instrument(s): Curette to remove Non-Viable tissue/material. Material removed includes [facility]. No specimens were taken. A time out was conducted  at 09:29, prior to the start of the procedure. A Minimum amount of bleeding was controlled with Pressure. The procedure was tolerated well. Post Debridement Measurements: 2cm length x 1.5cm width x 0.1cm depth; 0.236cm^3 volume. Character of Wound/Ulcer Post Debridement requires further debridement. Post procedure Diagnosis Wound #4: Same as Pre-Procedure General Notes: Scribed for Dr. Celine Ahr by Blanche East, RN. Plan Follow-up Appointments: Return Appointment in 1 week. - Dr. Celine Ahr - room 4 Anesthetic: Wound #3 Right,Anterior Lower Leg: (In clinic) Topical Lidocaine 4% applied to wound bed Edema Control - Lymphedema / SCD / Other: Avoid standing for long periods of time. WOUND #3: - Lower Leg Wound Laterality: Right, Anterior Cleanser: Soap and Water 1 x Per Week/30 Days Discharge Instructions: May shower and wash wound with dial antibacterial soap and water prior to dressing change. Cleanser: Wound Cleanser 1 x Per Week/30 Days Discharge Instructions: Cleanse the wound with wound cleanser prior to applying a clean dressing using gauze sponges, not tissue or cotton balls. Peri-Wound Care: Sween Lotion (Moisturizing lotion) 1 x Per Week/30 Days Discharge Instructions: Apply moisturizing lotion as directed Prim Dressing: Endoform 2x2 in 1 x Per Week/30 Days ary Discharge Instructions: Moisten with saline Secondary Dressing: ABD Pad, 8x10 1 x Per Week/30 Days Discharge Instructions: Apply over primary dressing as  directed. Secondary Dressing: Woven Gauze Sponge, Non-Sterile 4x4 in 1 x Per Week/30 Days Discharge Instructions: Apply over primary dressing as directed. Secured With: [employer] Size 5, 10 (yds) 1 x Per Week/30 Days Com pression Wrap: Kerlix Roll 4.5x3.1 (in/yd) 1 x Per Week/30 Days Discharge Instructions: Apply Kerlix and Coban compression as directed. Com pression Wrap: Coban Self-Adherent Wrap 4x5 (in/yd) 1 x Per Week/30 Days Discharge Instructions: Apply over Kerlix as directed. Com pression Wrap: Unnaboot w/Calamine, 4x10 (in/yd) 1 x Per Week/30 Days Discharge Instructions: Apply Unnaboot to top of dressing and around ankles to prevent dressing from sliding WOUND #4: - Upper Leg Wound Laterality: Right, Medial Cleanser: Soap and Water 1 x Per Week/30 Days Discharge Instructions: May shower and wash wound with dial antibacterial soap and water prior to dressing change. Cleanser: Wound Cleanser 1 x Per Week/30 Days Discharge Instructions: Cleanse the wound with wound cleanser prior to applying a clean dressing using gauze sponges, not tissue or cotton balls. Prim Dressing: Endoform 2x2 in 1 x Per Week/30 Days ary Discharge Instructions: Moisten with saline Secondary Dressing: Zetuvit Plus Silicone Border Dressing 4x4 (in/in) 1 x Per Week/30 Days Discharge Instructions: Apply silicone border over primary dressing as directed. 04/20/2022: The anterior tibial wound continues to contract. There is good granulation tissue with just a light layer of slough. The medial thigh ulcer is superficial with slough present. It is a bit fibrotic due to being in a scar. I used a curette to debride slough from both wounds. We will use endoform on both sites. Kerlix and Coban wrap on the lower leg and a foam border dressing on the upper leg. Follow-up in 1 week. Electronic Signature(s) Signed: 04/20/2022 9:48:49 AM By: [name] Entered By: Fredirick Maudlin on 04/20/2022  09:48:49 -------------------------------------------------------------------------------- HxROS Details Patient Name: Date of Service: Michael [name], Michael LTER [name]. 04/20/2022 9:15 A M Medical Record Number: [id number] Patient Account Number: [id number] Date of Birth/Sex: Treating RN: Jun 13, 1950 (71 y.o. M) Primary Care Provider: [name] Other Clinician: SABRE, Michael [name] ([id number]) [id number] Page 10 of 11 Referring Provider: Treating Provider/Extender: [name] in Treatment: 19 Information Obtained From Patient Constitutional Symptoms ([place])  Medical History: Past Medical History Notes: obesity , h/o (R) leg claudication (right fem stent September 2012) Ear/Nose/Mouth/Throat Medical History: Positive for: Chronic sinus problems/congestion - seasonal allergies Cardiovascular Medical History: Positive for: Hypertension; Peripheral Arterial Disease - STENTS; Peripheral Venous Disease Past Medical History Notes: hyperlipidemia Endocrine Medical History: Positive for: Type II Diabetes - last A1c- 8.2 Past Medical History Notes: pt. on diet intentionally losing 20 pounds since December 2016 in an effort to improve A1C levels Time with diabetes: 9 YRS Treated with: Insulin Blood sugar tested every day: Yes Tested : 7-8 TIMES Integumentary (Skin) Medical History: Past Medical History Notes: psoriatic arthritis Musculoskeletal Medical History: Past Medical History Notes: bilat knee pain identified as an ortho issue psoriatic arthritis Oncologic Medical History: Past Medical History Notes: Prostate cancer 2018 Psychiatric Medical History: Past Medical History Notes: anxiety HBO Extended History Items Ear/Nose/Mouth/Throat: Chronic sinus problems/congestion Immunizations Pneumococcal Vaccine: Received Pneumococcal Vaccination: Yes Received Pneumococcal Vaccination On or After 60th Birthday:  Yes Implantable Devices None Hospitalization / Surgery History Type of Hospitalization/Surgery left shoulder surgery Michael [name], Michael [name] ([id number].pdf Page 54 of 33 Family and Social History Cancer: Yes - Siblings; Heart Disease: Yes - Father; Hereditary Spherocytosis: No; Hypertension: Yes - Father,Mother; Kidney Disease: No; Seizures: No; Stroke: No; Thyroid Problems: No; Tuberculosis: No; Former smoker - smokeless tobacco; Marital Status - Married; Alcohol Use: Rarely - WINE; Drug Use: No History; Caffeine Use: Daily - COFFEE; Financial Concerns: No; Food, Clothing or Shelter Needs: No; Support System Lacking: No; Transportation Concerns: No Electronic Signature(s) Signed: 04/20/2022 11:21:45 AM By: [name] Entered By: Fredirick Maudlin on 04/20/2022 09:47:16 -------------------------------------------------------------------------------- SuperBill Details Patient Name: Date of Service: Michael [name], [place] [name]. 04/20/2022 Medical Record Number: [id number] Patient Account Number: [id number] Date of Birth/Sex: Treating RN: Feb 25, 1951 (72 y.o. 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 E11.622 Type 2 diabetes mellitus with other skin ulcer I73.9 Peripheral vascular disease, unspecified L97.122 Non-pressure chronic ulcer of left thigh with fat layer exposed Facility Procedures : CPT4 Code Description: [id number] - DEBRIDE WOUND 1ST 20 SQ CM OR < ICD-10 Diagnosis Description L97.815 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  involvemen L97.122 Non-pressure chronic ulcer of left thigh with fat  layer exposed Modifier: t without evidence Quantity: 1 of necrosis Physician Procedures : CPT4 Code Description Modifier [id number] - WC PHYS LEVEL 3 - EST PT 25 ICD-10 Diagnosis Description L97.815 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 without evidence L97.122 Non-pressure chronic ulcer of  left thigh with fat layer exposed I73.9 Peripheral vascular disease, unspecified E11.622 Type 2 diabetes mellitus with other skin ulcer Quantity: 1 of necrosis : [id number] - WC PHYS DEBR WO ANESTH 20 SQ CM ICD-10 Diagnosis Description L97.815 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

## 2022-04-23 NOTE — Progress Notes (Signed)
JAGUAR, SAFKO ([id number]) [phone].pdf Page 1 of 10 Visit Report for 04/20/2022 Arrival Information Details Patient Name: Date of Service: HO [place], [name] 04/20/2022 9:15 A M Medical Record Number: [id number] Patient Account Number: [id number] Date of Birth/Sex: Treating RN: [birth year] (73 y.o. [name] Primary Care Donathan Buller: [name] Other Clinician: Referring Ociel Retherford: Treating Rosalin Buster/Extender: [name] in Treatment: 27 Visit Information History Since Last Visit Added or deleted any medications: No Patient Arrived: Ambulatory Any new allergies or adverse reactions: No Arrival Time: 09:10 Had a fall or experienced change in No Accompanied By: wife activities of daily living that may affect Transfer Assistance: None risk of falls: Patient Identification Verified: Yes Signs or symptoms of abuse/neglect since last visito No Secondary Verification Process Completed: Yes Hospitalized since last visit: No Patient Requires Transmission-Based Precautions: No Implantable device outside of the clinic excluding No Patient Has Alerts: Yes cellular tissue based products placed in the center Patient Alerts: Patient on Blood Thinner since last visit: plavix Has Compression in Place as Prescribed: Yes Pain Present Now: Yes Electronic Signature(s) Signed: 04/22/2022 4:23:23 PM By: [name] Entered By: Blanche East on 04/20/2022 09:11:10 -------------------------------------------------------------------------------- Encounter Discharge Information Details Patient Name: Date of Service: [name] 04/20/2022 9:15 A M Medical Record Number: [id number] Patient Account Number: [id number] Date of Birth/Sex: Treating RN: 1950-04-08 (72 y.o. [name] Primary Care Brendy Ficek: [name] Other Clinician: Referring Sonita Michiels: Treating Yarianna Varble/Extender: [name] in  Treatment: 19 Encounter Discharge Information Items Post Procedure Vitals Discharge Condition: Stable Temperature (F): 97.6 Ambulatory Status: Ambulatory Pulse (bpm): 96 Discharge Destination: Home Respiratory Rate (breaths/min): 18 Transportation: Private Auto Blood Pressure (mmHg): 147/55 Accompanied By: spouse Schedule Follow-up Appointment: Yes Clinical Summary of Care: Electronic Signature(s) Signed: 04/22/2022 4:23:23 PM By: [name] Entered By: Blanche East on 04/20/2022 09:34:18 [name] ([id number].pdf Page 2 of 10 -------------------------------------------------------------------------------- Lower Extremity Assessment Details Patient Name: Date of Service: [name], [name] 04/20/2022 9:15 A M Medical Record Number: [id number] Patient Account Number: [id number] Date of Birth/Sex: Treating RN: [birth year] (72 y.o. [name] Primary Care Fraya Ueda: [name] Other Clinician: Referring Arianie Couse: Treating Owen Pagnotta/Extender: [name] in Treatment: 19 Edema Assessment Assessed: Shirlyn Goltz: No] Patrice Paradise: No] Edema: [Left: Ye] [Right: s] Calf Left: Right: Point of Measurement: 35 cm From Medial Instep 35.5 cm Ankle Left: Right: Point of Measurement: 12 cm From Medial Instep 21.4 cm Vascular Assessment Pulses: Dorsalis Pedis Palpable: [Right:Yes] Electronic Signature(s) Signed: 04/22/2022 4:23:23 PM By: [name] Entered By: Blanche East on 04/20/2022 09:20:02 -------------------------------------------------------------------------------- Multi Wound Chart Details Patient Name: Date of Service: [name] 04/20/2022 9:15 A M Medical Record Number: [id number] Patient Account Number: [id number] Date of Birth/Sex: Treating RN: August 01, 1950 (72 y.o. M) Primary Care Quamere Mussell: [name] Other Clinician: Referring Cantrell Larouche: Treating Pranish Akhavan/Extender: [name] in Treatment: 19 Vital Signs Height(in): 72 Capillary Blood Glucose(mg/dl): 76 Weight(lbs): 208 Pulse(bpm): 96 Body Mass Index(BMI): 28.2 Blood Pressure(mmHg): 147/55 Temperature(F): 97.6 Respiratory Rate(breaths/min): 18 [3:Photos:] [N/A:N/A [id number].pdf Page 3 of 10] Right, Anterior Lower Leg Right, Medial Upper Leg N/A Wound Location: Blister Gradually Appeared N/A Wounding Event: Diabetic Wound/Ulcer of the Lower Dehisced Wound N/A Primary Etiology: Extremity Chronic sinus problems/congestion,Chronic sinus problems/congestion, N/A Comorbid History: Hypertension, Peripheral Arterial Hypertension, Peripheral Arterial Disease, Peripheral Venous Disease, Disease, Peripheral Venous Disease, Type II Diabetes Type II Diabetes 09/30/2021 01/05/2022 N/A  Date Acquired: 64 14 N/A Weeks of Treatment: Open Open N/A Wound Status: No No N/A Wound Recurrence: 2x2x0.1 2x1.5x0.1 N/A Measurements L x W x D (cm) 3.142 2.356 N/A A (cm) : rea 0.314 0.236 N/A Volume (cm) : 82.10% N/A N/A % Reduction in A rea: 94.00% N/A N/A % Reduction in Volume: Grade 2 Full Thickness Without Exposed N/A Classification: Support Structures Medium Medium N/A Exudate A mount: Serosanguineous Serosanguineous N/A Exudate Type: red, brown red, brown N/A Exudate Color: Distinct, outline attached Distinct, outline attached N/A Wound Margin: Large (67-100%) Small (1-33%) N/A Granulation A mount: Pink Red, Pink N/A Granulation Quality: Small (1-33%) Large (67-100%) N/A Necrotic A mount: Adherent Slough Eschar, Adherent Slough N/A Necrotic Tissue: Fat Layer (Subcutaneous Tissue): Yes Fat Layer (Subcutaneous Tissue): Yes N/A Exposed Structures: Fascia: No Fascia: No Tendon: No Tendon: No Muscle: No Muscle: No Joint: No Joint: No Bone: No Bone: No Small (1-33%) Small (1-33%) N/A Epithelialization: Debridement - Selective/Open Wound  Debridement - Selective/Open Wound N/A Debridement: Pre-procedure Verification/Time Out 09:29 09:29 N/A Taken: [facility] N/A Tissue Debrided: Non-Viable Tissue Non-Viable Tissue N/A Level: 4 3 N/A Debridement A (sq cm): rea Curette Curette N/A Instrument: Minimum Minimum N/A Bleeding: Pressure Pressure N/A Hemostasis A chieved: Procedure was tolerated well Procedure was tolerated well N/A Debridement Treatment Response: 2x2x0.1 2x1.5x0.1 N/A Post Debridement Measurements L x W x D (cm) 0.314 0.236 N/A Post Debridement Volume: (cm) Scarring: Yes Scarring: Yes N/A Periwound Skin Texture: Dry/Scaly: Yes No Abnormalities Noted N/A Periwound Skin Moisture: Rubor: Yes No Abnormalities Noted N/A Periwound Skin Color: No Abnormality No Abnormality N/A Temperature: Debridement Debridement N/A Procedures Performed: Treatment Notes Wound #3 (Lower Leg) Wound Laterality: Right, Anterior Cleanser Soap and Water Discharge Instruction: May shower and wash wound with dial antibacterial soap and water prior to dressing change. Wound Cleanser Discharge Instruction: Cleanse the wound with wound cleanser prior to applying a clean dressing using gauze sponges, not tissue or cotton balls. Peri-Wound Care Sween Lotion (Moisturizing lotion) Discharge Instruction: Apply moisturizing lotion as directed Topical Primary Dressing Endoform 2x2 in Discharge Instruction: Moisten with saline Secondary Dressing ABD Pad, 8x10 Discharge Instruction: Apply over primary dressing as directed. Woven Gauze Sponge, Non-Sterile 4x4 in Discharge Instruction: Apply over primary dressing as directed. Secured With TAIGE, DESANTIS ([id number]) [id number] Page 4 of 10 Stretch Net Size 5, 10 (yds) Compression Wrap Kerlix Roll 4.5x3.1 (in/yd) Discharge Instruction: Apply Kerlix and Coban compression as directed. Coban Self-Adherent Wrap 4x5 (in/yd) Discharge Instruction:  Apply over Kerlix as directed. Unnaboot w/Calamine, 4x10 (in/yd) Discharge Instruction: Apply Unnaboot to top of dressing and around ankles to prevent dressing from sliding Compression Stockings Add-Ons Wound #4 (Upper Leg) Wound Laterality: Right, Medial Cleanser Soap and Water Discharge Instruction: May shower and wash wound with dial antibacterial soap and water prior to dressing change. Wound Cleanser Discharge Instruction: Cleanse the wound with wound cleanser prior to applying a clean dressing using gauze sponges, not tissue or cotton balls. Peri-Wound Care Topical Primary Dressing Endoform 2x2 in Discharge Instruction: Moisten with saline Secondary Dressing Zetuvit Plus Silicone Border Dressing 4x4 (in/in) Discharge Instruction: Apply silicone border over primary dressing as directed. Secured With Compression Wrap Compression Stockings Environmental education officer) Signed: 04/20/2022 9:45:57 AM By: [name] Entered By: Fredirick Maudlin on 04/20/2022 09:45:57 -------------------------------------------------------------------------------- Multi-Disciplinary Care Plan Details Patient Name: Date of Service: HO [place], [place] C. 04/20/2022 9:15 A M Medical Record Number: [id number] Patient Account Number: [id number] Date of Birth/Sex: Treating RN: January 20, 1951 (71  y.o. Jerilynn Mages) Blanche East Primary Care Kessler Solly: [name] Other Clinician: Referring Imogen Maddalena: Treating Romualdo Prosise/Extender: [name] in Treatment: 29 Active Inactive Nutrition Nursing Diagnoses: Potential for alteratiion in Nutrition/Potential for imbalanced nutrition Goals: Patient/caregiver agrees to and verbalizes understanding of need to use nutritional supplements and/or vitamins as prescribed Date Initiated: 12/04/2021 Target Resolution Date: 05/29/2022 AREND, HARWELL ([id number]) [phone].pdf Page 5 of 10 Goal Status:  Active Patient/caregiver verbalizes understanding of need to maintain therapeutic glucose control per primary care physician Date Initiated: 12/04/2021 Target Resolution Date: 05/29/2022 Goal Status: Active Interventions: Assess patient nutrition upon admission and as needed per policy Provide education on elevated blood sugars and impact on wound healing Provide education on nutrition Treatment Activities: Education provided on Nutrition : 01/26/2022 Notes: Wound/Skin Impairment Nursing Diagnoses: Impaired tissue integrity Knowledge deficit related to ulceration/compromised skin integrity Goals: Patient/caregiver will verbalize understanding of skin care regimen Date Initiated: 12/04/2021 Date Inactivated: 02/02/2022 Target Resolution Date: 01/28/2022 Goal Status: Met Ulcer/skin breakdown will have a volume reduction of 30% by week 4 Date Initiated: 12/04/2021 Date Inactivated: 01/07/2022 Target Resolution Date: 12/26/2021 Goal Status: Met Ulcer/skin breakdown will have a volume reduction of 50% by week 8 Date Initiated: 01/07/2022 Date Inactivated: 03/16/2022 Target Resolution Date: 04/01/2022 Goal Status: Met Ulcer/skin breakdown will have a volume reduction of 80% by week 12 Date Initiated: 03/16/2022 Target Resolution Date: 05/29/2022 Goal Status: Active Interventions: Assess ulceration(s) every visit Provide education on ulcer and skin care Notes: Electronic Signature(s) Signed: 04/22/2022 4:23:23 PM By: [name] Entered By: Blanche East on 04/20/2022 09:32:38 -------------------------------------------------------------------------------- Pain Assessment Details Patient Name: Date of Service: [name], [place] C. 04/20/2022 9:15 A M Medical Record Number: [id number] Patient Account Number: [id number] Date of Birth/Sex: Treating RN: 1950-09-06 (72 y.o. [name] Primary Care Maranatha Grossi: [name] Other Clinician: Referring Danialle Dement: Treating  Abid Bolla/Extender: [name] in Treatment: 19 Active Problems Location of Pain Severity and Description of Pain Patient Has Paino Yes Site Locations Rate the pain. DORWIN, CHAUNCEY ([id number]) [phone].pdf Page 6 of 10 Rate the pain. Current Pain Level: 1 Character of Pain Describe the Pain: Aching, Burning Pain Management and Medication Current Pain Management: Electronic Signature(s) Signed: 04/22/2022 4:23:23 PM By: [name] Entered By: Blanche East on 04/20/2022 09:19:47 -------------------------------------------------------------------------------- Patient/Caregiver Education Details Patient Name: Date of Service: [name] 2/19/2024andnbsp9:15 [place] Record Number: [id number] Patient Account Number: [id number] Date of Birth/Gender: Treating RN: [birth year] (72 y.o. [name] Primary Care Physician: [name] Other Clinician: Referring Physician: Treating Physician/Extender: [name] in Treatment: 46 Education Assessment Education Provided To: Patient Education Topics Provided Wound Debridement: Methods: Explain/Verbal Responses: Reinforcements needed, State content correctly Wound/Skin Impairment: Methods: Explain/Verbal Responses: Reinforcements needed, State content correctly Electronic Signature(s) Signed: 04/22/2022 4:23:23 PM By: [name] Entered By: Blanche East on 04/20/2022 09:33:07 [name] ([id number].pdf Page 7 of 10 -------------------------------------------------------------------------------- Wound Assessment Details Patient Name: Date of Service: [name], [name] 04/20/2022 9:15 A M Medical Record Number: [id number] Patient Account Number: [id number] Date of Birth/Sex: Treating RN: 01-31-1951 (72 y.o. [name] Primary Care Marlita Keil: [name] Other  Clinician: Referring Aishwarya Shiplett: Treating Sharnise Blough/Extender: [name] in Treatment: 19 Wound Status Wound Number: 3 Primary Diabetic Wound/Ulcer of the Lower Extremity Etiology: Wound Location: Right, Anterior Lower Leg Wound Open Wounding Event: Blister Status: Date Acquired: 09/30/2021 Comorbid Chronic sinus problems/congestion, Hypertension, Peripheral Weeks Of  Treatment: 19 History: Arterial Disease, Peripheral Venous Disease, Type II Diabetes Clustered Wound: No Photos Wound Measurements Length: (cm) 2 Width: (cm) 2 Depth: (cm) 0.1 Area: (cm) 3.142 Volume: (cm) 0.314 % Reduction in Area: 82.1% % Reduction in Volume: 94% Epithelialization: Small (1-33%) Tunneling: No Undermining: No Wound Description Classification: Grade 2 Wound Margin: Distinct, outline attached Exudate Amount: Medium Exudate Type: Serosanguineous Exudate Color: red, brown Foul Odor After Cleansing: No Slough/Fibrino Yes Wound Bed Granulation Amount: Large (67-100%) Exposed Structure Granulation Quality: Pink Fascia Exposed: No Necrotic Amount: Small (1-33%) Fat Layer (Subcutaneous Tissue) Exposed: Yes Necrotic Quality: Adherent Slough Tendon Exposed: No Muscle Exposed: No Joint Exposed: No Bone Exposed: No Periwound Skin Texture Texture Color No Abnormalities Noted: No No Abnormalities Noted: No Scarring: Yes Rubor: Yes Moisture Temperature / Pain No Abnormalities Noted: No Temperature: No Abnormality Dry / Scaly: Yes Treatment Notes Wound #3 (Lower Leg) Wound Laterality: Right, Anterior Cleanser Soap and Water Discharge Instruction: May shower and wash wound with dial antibacterial soap and water prior to dressing change. Wound Cleanser Discharge Instruction: Cleanse the wound with wound cleanser prior to applying a clean dressing using gauze sponges, not tissue or cotton balls. MAJER, BANSAL ([id number]) [phone].pdf Page  8 of 10 Peri-Wound Care Sween Lotion (Moisturizing lotion) Discharge Instruction: Apply moisturizing lotion as directed Topical Primary Dressing Endoform 2x2 in Discharge Instruction: Moisten with saline Secondary Dressing ABD Pad, 8x10 Discharge Instruction: Apply over primary dressing as directed. Woven Gauze Sponge, Non-Sterile 4x4 in Discharge Instruction: Apply over primary dressing as directed. Secured With [employer] Size 5, 10 (yds) Compression Wrap Kerlix Roll 4.5x3.1 (in/yd) Discharge Instruction: Apply Kerlix and Coban compression as directed. Coban Self-Adherent Wrap 4x5 (in/yd) Discharge Instruction: Apply over Kerlix as directed. Unnaboot w/Calamine, 4x10 (in/yd) Discharge Instruction: Apply Unnaboot to top of dressing and around ankles to prevent dressing from sliding Compression Stockings Add-Ons Electronic Signature(s) Signed: 04/22/2022 4:23:23 PM By: [name] Entered By: Blanche East on 04/20/2022 09:24:37 -------------------------------------------------------------------------------- Wound Assessment Details Patient Name: Date of Service: [name], [name] 04/20/2022 9:15 A M Medical Record Number: [id number] Patient Account Number: [id number] Date of Birth/Sex: Treating RN: 1950/04/22 (72 y.o. [name] Primary Care Dreshaun Stene: [name] Other Clinician: Referring Hannah Crill: Treating Travious Vanover/Extender: [name] in Treatment: 19 Wound Status Wound Number: 4 Primary Dehisced Wound Etiology: Wound Location: Right, Medial Upper Leg Wound Open Wounding Event: Gradually Appeared Status: Date Acquired: 01/05/2022 Comorbid Chronic sinus problems/congestion, Hypertension, Peripheral Weeks Of Treatment: 14 History: Arterial Disease, Peripheral Venous Disease, Type II Diabetes Clustered Wound: No Photos TALLON, FINKLE ([id number]) [phone].pdf Page 9 of 10 Wound  Measurements Length: (cm) 2 Width: (cm) 1.5 Depth: (cm) 0.1 Area: (cm) 2.356 Volume: (cm) 0.236 % Reduction in Area: % Reduction in Volume: Epithelialization: Small (1-33%) Tunneling: No Undermining: No Wound Description Classification: Full Thickness Without Exposed Support Structures Wound Margin: Distinct, outline attached Exudate Amount: Medium Exudate Type: Serosanguineous Exudate Color: red, brown Foul Odor After Cleansing: No Slough/Fibrino Yes Wound Bed Granulation Amount: Small (1-33%) Exposed Structure Granulation Quality: Red, Pink Fascia Exposed: No Necrotic Amount: Large (67-100%) Fat Layer (Subcutaneous Tissue) Exposed: Yes Necrotic Quality: Eschar, Adherent Slough Tendon Exposed: No Muscle Exposed: No Joint Exposed: No Bone Exposed: No Periwound Skin Texture Texture Color No Abnormalities Noted: No No Abnormalities Noted: Yes Scarring: Yes Temperature / Pain Temperature: No Abnormality Moisture No Abnormalities Noted: Yes Treatment Notes Wound #4 (Upper Leg) Wound Laterality: Right, Medial Cleanser Soap and Water Discharge  Instruction: May shower and wash wound with dial antibacterial soap and water prior to dressing change. Wound Cleanser Discharge Instruction: Cleanse the wound with wound cleanser prior to applying a clean dressing using gauze sponges, not tissue or cotton balls. Peri-Wound Care Topical Primary Dressing Endoform 2x2 in Discharge Instruction: Moisten with saline Secondary Dressing Zetuvit Plus Silicone Border Dressing 4x4 (in/in) Discharge Instruction: Apply silicone border over primary dressing as directed. Secured With Compression Wrap Compression Stockings Environmental education officer) Signed: 04/22/2022 4:23:23 PM By: [name] Entered By: Blanche East on 04/20/2022 09:25:40 Vitals Details -------------------------------------------------------------------------------- [name] ([id number] Page 10 of 10 Patient Name: Date of Service: HO FFMA [place], [name] 04/20/2022 9:15 A M Medical Record Number: [id number] Patient Account Number: [id number] Date of Birth/Sex: Treating RN: [birth year] (72 y.o. [name] Primary Care Lilianna Case: [name] Other Clinician: Referring Zeriyah Wain: Treating Shamiya Demeritt/Extender: [name] in Treatment: 19 Vital Signs Time Taken: 09:15 Temperature (F): 97.6 Height (in): 72 Pulse (bpm): 96 Weight (lbs): 208 Respiratory Rate (breaths/min): 18 Body Mass Index (BMI): 28.2 Blood Pressure (mmHg): 147/55 Capillary Blood Glucose (mg/dl): 76 Reference Range: 80 - 120 mg / dl Electronic Signature(s) Signed: 04/22/2022 4:23:23 PM By: [name] Entered By: Blanche East on 04/20/2022 09:16:38

## 2022-04-27 ENCOUNTER — Encounter (HOSPITAL_BASED_OUTPATIENT_CLINIC_OR_DEPARTMENT_OTHER): Payer: Medicare Other | Attending: General Surgery | Admitting: General Surgery

## 2022-04-27 DIAGNOSIS — E11622 Type 2 diabetes mellitus with other skin ulcer: Secondary | ICD-10-CM | POA: Insufficient documentation

## 2022-04-27 DIAGNOSIS — E1122 Type 2 diabetes mellitus with diabetic chronic kidney disease: Secondary | ICD-10-CM | POA: Insufficient documentation

## 2022-04-27 DIAGNOSIS — N189 Chronic kidney disease, unspecified: Secondary | ICD-10-CM | POA: Insufficient documentation

## 2022-04-27 DIAGNOSIS — L97812 Non-pressure chronic ulcer of other part of right lower leg with fat layer exposed: Secondary | ICD-10-CM | POA: Diagnosis not present

## 2022-04-27 DIAGNOSIS — L97122 Non-pressure chronic ulcer of left thigh with fat layer exposed: Secondary | ICD-10-CM | POA: Diagnosis not present

## 2022-04-27 DIAGNOSIS — I70239 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: Secondary | ICD-10-CM | POA: Diagnosis not present

## 2022-04-27 DIAGNOSIS — T8131XA Disruption of external operation (surgical) wound, not elsewhere classified, initial encounter: Secondary | ICD-10-CM | POA: Diagnosis not present

## 2022-04-27 DIAGNOSIS — I129 Hypertensive chronic kidney disease with stage 1 through stage 4 chronic kidney disease, or unspecified chronic kidney disease: Secondary | ICD-10-CM | POA: Insufficient documentation

## 2022-04-27 DIAGNOSIS — L97815 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: Secondary | ICD-10-CM | POA: Diagnosis not present

## 2022-04-27 DIAGNOSIS — E1151 Type 2 diabetes mellitus with diabetic peripheral angiopathy without gangrene: Secondary | ICD-10-CM | POA: Insufficient documentation

## 2022-04-27 DIAGNOSIS — E785 Hyperlipidemia, unspecified: Secondary | ICD-10-CM | POA: Diagnosis not present

## 2022-04-28 DIAGNOSIS — E1151 Type 2 diabetes mellitus with diabetic peripheral angiopathy without gangrene: Secondary | ICD-10-CM | POA: Diagnosis not present

## 2022-04-28 DIAGNOSIS — Z8546 Personal history of malignant neoplasm of prostate: Secondary | ICD-10-CM | POA: Diagnosis not present

## 2022-04-28 DIAGNOSIS — Z794 Long term (current) use of insulin: Secondary | ICD-10-CM | POA: Diagnosis not present

## 2022-04-28 NOTE — Progress Notes (Signed)
Michael [name], Michael [name] ([id number]) [id number] Page 1 of 11 Visit Report for 04/27/2022 Chief Complaint Document Details Patient Name: Date of Service: HO FFMA [place], [name] 04/27/2022 9:15 A M Medical Record Number: [id number] Patient Account Number: [id number] Date of Birth/Sex: Treating RN: 11/08/1950 (72 y.o. M) Primary Care Provider: [name] Other Clinician: Referring Provider: Treating Provider/Extender: [name] in Treatment: 40 Information Obtained from: Patient Chief Complaint Patient presents to the wound care center today with an open arterial ulcer to the right lower extremity in the setting of diabetes mellitus. he has had this problem for 7 months 12/04/2021: ulcer to right lower anterior tibial surface Electronic Signature(s) Signed: 04/27/2022 10:28:28 AM By: [name] Entered By: Fredirick Maudlin on 04/27/2022 [id number] -------------------------------------------------------------------------------- Debridement Details Patient Name: Date of Service: [name] 04/27/2022 9:15 A M Medical Record Number: [id number] Patient Account Number: [id number] Date of Birth/Sex: Treating RN: [birth year] (72 y.o. Michael [name] Primary Care Provider: [name] Other Clinician: Referring Provider: Treating Provider/Extender: [name] in Treatment: 20 Debridement Performed for Assessment: Wound #3 Right,Anterior Lower Leg Performed By: Physician Fredirick Maudlin, MD Debridement Type: Debridement Severity of Tissue Pre Debridement: Fat layer exposed Level of Consciousness (Pre-procedure): Awake and Alert Pre-procedure Verification/Time Out Yes - 09:54 Taken: Start Time: 09:55 Pain Control: Lidocaine 5% topical ointment T Area Debrided (L x W): otal 2 (cm) x 1.6 (cm) = 3.2 (cm) Tissue and other material debrided: Non-Viable, Slough, Slough Level: Non-Viable  Tissue Debridement Description: Selective/Open Wound Instrument: Curette Bleeding: Minimum Hemostasis Achieved: Pressure Response to Treatment: Procedure was tolerated well Level of Consciousness (Post- Awake and Alert procedure): Post Debridement Measurements of Total Wound Length: (cm) 2 Width: (cm) 1.6 Depth: (cm) 0.1 Volume: (cm) 0.251 Character of Wound/Ulcer Post Debridement: Requires Further Debridement Severity of Tissue Post Debridement: Fat layer exposed Post Procedure Diagnosis Same as Pre-procedure Notes Scribed for Dr. Celine Ahr by Blanche East, RN Electronic Signature(s) Signed: 04/27/2022 12:01:26 PM By: [name] [name] ([id number]) [id number] Page 2 of 11 Signed: 04/27/2022 4:56:09 PM By: [name] Entered By: Blanche East on 04/27/2022 09:56:40 -------------------------------------------------------------------------------- Debridement Details Patient Name: Date of Service: [name], [name] 04/27/2022 9:15 A M Medical Record Number: [id number] Patient Account Number: [id number] Date of Birth/Sex: Treating RN: Dec 29, 1950 (72 y.o. Michael [name] Primary Care Provider: [name] Other Clinician: Referring Provider: Treating Provider/Extender: [name] in Treatment: 20 Debridement Performed for Assessment: Wound #4 Right,Medial Upper Leg Performed By: Physician Fredirick Maudlin, MD Debridement Type: Debridement Level of Consciousness (Pre-procedure): Awake and Alert Pre-procedure Verification/Time Out Yes - 09:54 Taken: Start Time: 09:55 Pain Control: Lidocaine 5% topical ointment T Area Debrided (L x W): otal 2 (cm) x 1 (cm) = 2 (cm) Tissue and other material debrided: Non-Viable, Slough, Subcutaneous, Slough Level: Skin/Subcutaneous Tissue Debridement Description: Excisional Instrument: Curette Bleeding: Minimum Hemostasis Achieved: Pressure Response to  Treatment: Procedure was tolerated well Level of Consciousness (Post- Awake and Alert procedure): Post Debridement Measurements of Total Wound Length: (cm) 2 Width: (cm) 1 Depth: (cm) 0.1 Volume: (cm) 0.157 Character of Wound/Ulcer Post Debridement: Requires Further Debridement Post Procedure Diagnosis Same as Pre-procedure Notes Scribed for Dr. Celine Ahr by Blanche East, RN Electronic Signature(s) Signed: 04/27/2022 12:01:26 PM By: [name] Signed: 04/27/2022 4:56:09 PM By: [name] Entered By: Blanche East on 04/27/2022 09:59:25 -------------------------------------------------------------------------------- HPI Details Patient Name: Date of Service: HO  [place], [place] LTER [name]. 04/27/2022 9:15 A M Medical Record Number: [id number] Patient Account Number: [id number] Date of Birth/Sex: Treating RN: [birth year] (72 y.o. M) Primary Care Provider: [name] Other Clinician: Referring Provider: Treating Provider/Extender: [name] in Treatment: 20 History of Present Illness Location: right lateral calf closer to the knee Quality: Patient reports experiencing a dull pain to affected area(s). Severity: Patient states wound(s) are getting worse. Duration: Patient has had the wound for > 7 months prior to seeking treatment at the wound center Timing: Pain in wound is constant (hurts all the time) Context: The wound would happen gradually Modifying Factors: Patient wound(s)/ulcer(s) are worsening due to : no resolution and a white material at the base of the wound ssociated Signs and Symptoms: Patient reports having:no discharge or purulent material A HPI Description: 72 year old gentleman who has been referred to was from his PCP for a chronic ulceration on his right lower extremity which she's had for several weeks. Past medical history significant for diabetes mellitus type 2, hypertension, hyperlipidemia, anxiety, obesity, peripheral  vascular disease and Michael [name], Michael [name] ([id number]) [id number] Page 3 of 11 chronic kidney disease. He has never been a smoker. Most recent lab work done at his PCPs office showed a glucose of 217 milligrams per deciliter which is consistent with hyperglycemia. In January 2017 recent hemoglobin A1c values noted to be 8.2%. In April 2017, he has been seen at the vascular office by Dr. Trula Slade and Dr. Kellie Simmering for right leg claudication. He had a right superficial femoral artery stent in September 2012. Recent noninvasive vascular imaging done on 06/24/2015 showed a right ABI of 1.07 with a triphasic waveform and a right TBI of 0.81. The left was noncompressible with a triphasic waveform and a TBI of 1.17. Right lower extremity arterial duplex was unable to identify stent exit but they were elevated velocities at the stent and in the distal thigh with triphasic waveforms. Dr. Stephens Shire opinion was to return to the clinic in 6 months with ABI and right lower extremity arterial duplex studies to be done. He has seen a dermatologist who had done a biopsy and said it was benign and he was given a steroid cream. 10/23/2015 -- Pathology report of the biopsy done in April 2017 shows ulcer with underlying angiodermatitis consistent with stasis dermatitis. 10/30/2015 -- he is going for shoulder surgery in about 10 days' time and was asking about the perioperative care. His blood sugars are running in the high 100s or low 200s. No hemoglobin A1c done recently. 11/20/2015 -- is back up to 2 weeks because of recent left shoulder surgery. 12/04/15; patient returns today with the wound for the most part looking healthy. No evidence of infection no debridement required. He is using Prisma for 4 weeks, without obvious improvement per her intake nurse. This started as several small open areas that were raised erythematous. He saw dermatology and at some point this was biopsied that just  suggested stasis skin physiology. This certainly doesn't look like that 12/11/15; using Hydrafera Blue. Previous biopsy reviewed, no atypia PAS negative. He has a history of stents in the right leg however he does not appear to have primary arterial insufficiency ABI in this clinic was over 0.9. 12/25/2015 -- he has been approved for grafix and we will order for some to be applied next week 01/01/2016 -- he has had his first application of Grafix today 01/08/2016 -- he has had his second application of Grafix today READMISSION 12/04/2021 This  is a now 72 year old man who was followed in our clinic about 5 years ago for an ulceration on his right lateral lower leg, just distal to the knee. He has since undergone a number of revascularization procedures that have been complicated by restenosis and wound infections. During the course of this treatment, he developed a small ulcer on his right anterior tibial surface. Despite use of an Unna boot, the wound has continued to expand. He was referred to the wound care center by Dr. Trula Slade for further evaluation and management. On the right anterior tibial surface, there is an irregular wound with heavy slough and eschar accumulation. The periwound skin is intact but he does have 1-2+ pitting edema. There is no purulent drainage or malodor. 10/13; second visit for this man who has an ischemic wound in the setting of type 2 diabetes on the right anterior mid tibia area. He has been revascularized. Using Santyl gent 12/22/2021: The wound is about the same size this week. There is a lot of gray sloughy material on the surface, secondary to silver nitrate used to stop bleeding after what sounds like a fairly aggressive debridement last week. 12/30/2021: The wound is smaller this week and a bit cleaner. Edema control is excellent. There is still a bit of slough accumulation. 01/07/2022: The wound continues to contract and is much cleaner this week. Edema control  is very good. He has small openings in his upper leg surgical scars, but he is going to be seeing Dr. Trula Slade next Monday and will have him take a look. He has been applying manuka honey to both of the sites. We have been using Santyl and gentamicin on his lower leg wound. 01/14/2022: He saw vascular surgery this week and was told that they were happy to have [place] manage the 2 wounds from his operation. Both of these have a light layer of slough on the surface. The more distal wound is a little bit dry. The anterior tibial wound continues to accumulate a fair amount of slough. Edema control is good. 01/19/2022: Both upper leg wounds are smaller. The more distal is quite dry and cracked when I was examining it. The lower leg wound is more painful and the surrounding tissue is erythematous and indurated. 01/26/2022: The upper leg wounds are healed. The skin at the distal leg wound is quite dry. The lower leg wound is cleaner but still fairly painful. His wrap slid again this week. 02/02/2022: We had good success keeping his wrap intact using a standard Unna boot. The lower leg wound continues to contract and is clean and less painful this week. 12/11; TheraSkin #1 12/18; TheraSkin #2. Wound looks as though it is contracted. Patient states that his pain was better in the leg. Secondary dressing and kerlix Coban 02/24/2022: The wound is smaller since I saw it last. It is less painful and there is good granulation tissue, including over the exposed tendon. 03/09/2022: The wound continues to contract. There is continued fill of the wound cavity with granulation tissue. 03/16/2022: The wound is smaller again today. There is good granulation tissue forming, covering the tendon. 03/30/2022: The wound continues to contract and fill with good granulation tissue. The granulation tissue is oozing a little bit at the lateral edge. 04/13/2022: We left his TheraSkin on an extra week because it was looking so good. The  wound is smaller this week and flush with the surrounding skin. His medial thigh wound has opened up again. There is slough and eschar  present. 04/20/2022: The anterior tibial wound continues to contract. There is good granulation tissue with just a light layer of slough. The medial thigh ulcer is superficial with slough present. It is a bit fibrotic due to being in a scar. 04/27/2022: The anterior tibial wound continues to contract. There is still some slough buildup with good granulation tissue. The medial thigh ulcer is very leathery and dry. Electronic Signature(s) Signed: 04/27/2022 10:29:05 AM By: [name] Entered By: Fredirick Maudlin on 04/27/2022 10:29:05 [name] ([id number] Page 4 of 11 -------------------------------------------------------------------------------- Physical Exam Details Patient Name: Date of Service: [name], [name] 04/27/2022 9:15 A M Medical Record Number: [id number] Patient Account Number: [id number] Date of Birth/Sex: Treating RN: 1950-03-20 (72 y.o. M) Primary Care Provider: [name] Other Clinician: Referring Provider: Treating Provider/Extender: [name] in Treatment: 20 Constitutional . . . . no acute distress. Respiratory Normal work of breathing on room air. Notes 04/27/2022: The anterior tibial wound continues to contract. There is still some slough buildup with good granulation tissue. The medial thigh ulcer is very leathery and dry. Electronic Signature(s) Signed: 04/27/2022 10:29:38 AM By: [name] Entered By: Fredirick Maudlin on 04/27/2022 10:29:38 -------------------------------------------------------------------------------- Physician Orders Details Patient Name: Date of Service: [name] 04/27/2022 9:15 A M Medical Record Number: [id number] Patient Account Number: [id number] Date of Birth/Sex: Treating  RN: 1950-12-30 (72 y.o. Michael [name] Primary Care Provider: [name] Other Clinician: Referring Provider: Treating Provider/Extender: [name] in Treatment: 20 Verbal / Phone Orders: No Diagnosis Coding ICD-10 Coding Code Description [phone] 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 I70.239 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 E11.622 Type 2 diabetes mellitus with other skin ulcer I73.9 Peripheral vascular disease, unspecified L97.122 Non-pressure chronic ulcer of left thigh with fat layer exposed Follow-up Appointments ppointment in 1 week. - Dr. Celine Ahr - room 4 Return A Anesthetic Wound #3 Right,Anterior Lower Leg (In clinic) Topical Lidocaine 4% applied to wound bed Edema Control - Lymphedema / SCD / Other Right Lower Extremity Avoid standing for long periods of time. Wound Treatment Wound #3 - Lower Leg Wound Laterality: Right, Anterior Cleanser: Soap and Water 1 x Per Week/30 Days Discharge Instructions: May shower and wash wound with dial antibacterial soap and water prior to dressing change. Cleanser: Wound Cleanser 1 x Per Week/30 Days Discharge Instructions: Cleanse the wound with wound cleanser prior to applying a clean dressing using gauze sponges, not tissue or cotton balls. Peri-Wound Care: Sween Lotion (Moisturizing lotion) 1 x Per Week/30 Days Discharge Instructions: Apply moisturizing lotion as directed Prim Dressing: Endoform 2x2 in 1 x Per Week/30 Days ary Discharge Instructions: Moisten with saline Secondary Dressing: ABD Pad, 8x10 1 x Per Week/30 Days Michael [name], Michael [name] ([id number]) [id number].pdf Page 5 of 11 Discharge Instructions: Apply over primary dressing as directed. Secondary Dressing: Woven Gauze Sponge, Non-Sterile 4x4 in 1 x Per Week/30 Days Discharge Instructions: Apply  over primary dressing as directed. Secured With: [employer] Size 5, 10 (yds) 1 x Per Week/30 Days Compression Wrap: Kerlix Roll 4.5x3.1 (in/yd) 1 x Per Week/30 Days Discharge Instructions: Apply Kerlix and Coban compression as directed. Compression Wrap: Coban Self-Adherent Wrap 4x5 (in/yd) 1 x Per Week/30 Days Discharge Instructions: Apply over Kerlix as directed. Compression Wrap: Unnaboot w/Calamine, 4x10 (in/yd) 1 x Per Week/30 Days Discharge Instructions: Apply  Unnaboot to top of dressing and around ankles to prevent dressing from sliding Wound #4 - Upper Leg Wound Laterality: Right, Medial Cleanser: Soap and Water 1 x Per Week/30 Days Discharge Instructions: May shower and wash wound with dial antibacterial soap and water prior to dressing change. Cleanser: Wound Cleanser 1 x Per Week/30 Days Discharge Instructions: Cleanse the wound with wound cleanser prior to applying a clean dressing using gauze sponges, not tissue or cotton balls. Prim Dressing: Endoform 2x2 in 1 x Per Week/30 Days ary Discharge Instructions: Moisten with saline Secondary Dressing: Zetuvit Plus Silicone Border Dressing 4x4 (in/in) 1 x Per Week/30 Days Discharge Instructions: Apply silicone border over primary dressing as directed. Electronic Signature(s) Signed: 04/27/2022 12:01:26 PM By: [name] Entered By: Fredirick Maudlin on 04/27/2022 10:29:56 -------------------------------------------------------------------------------- Problem List Details Patient Name: Date of Service: [name], [name] 04/27/2022 9:15 A M Medical Record Number: [id number] Patient Account Number: [id number] Date of Birth/Sex: Treating RN: 12-Sep-1950 (72 y.o. M) Primary Care Provider: [name] Other Clinician: Referring Provider: Treating Provider/Extender: [name] in Treatment: 20 Active Problems ICD-10 Encounter Code Description Active Date MDM Diagnosis L97.815  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 12/04/2021 No Yes involvement without evidence of necrosis I70.239 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 12/04/2021 No Yes E11.622 Type 2 diabetes mellitus with other skin ulcer 12/04/2021 No Yes I73.9 Peripheral vascular disease, unspecified 12/04/2021 No Yes L97.122 Non-pressure chronic ulcer of left thigh with fat layer exposed 01/14/2022 No Yes Inactive Problems Michael [name], Michael [name] ([id number]) [id number] Page 6 of 11 Resolved Problems Electronic Signature(s) Signed: 04/27/2022 10:28:11 AM By: [name] Entered By: Fredirick Maudlin on 04/27/2022 10:28:11 -------------------------------------------------------------------------------- Progress Note Details Patient Name: Date of Service: [name] 04/27/2022 9:15 A M Medical Record Number: [id number] Patient Account Number: [id number] Date of Birth/Sex: Treating RN: 11-Jan-1951 (72 y.o. M) Primary Care Provider: [name] Other Clinician: Referring Provider: Treating Provider/Extender: [name] in Treatment: 20 Subjective Chief Complaint Information obtained from Patient Patient presents to the wound care center today with an open arterial ulcer to the right lower extremity in the setting of diabetes mellitus. he has had this problem for 7 months 12/04/2021: ulcer to right lower anterior tibial surface History of Present Illness (HPI) The following HPI elements were documented for the patient's wound: Location: right lateral calf closer to the knee Quality: Patient reports experiencing a dull pain to affected area(s). Severity: Patient states wound(s) are getting worse. Duration: Patient has had the wound for > 7 months prior to seeking treatment at the wound center Timing: Pain in wound is constant (hurts all the time) Context: The  wound would happen gradually Modifying Factors: Patient wound(s)/ulcer(s) are worsening due to : no resolution and a white material at the base of the wound Associated Signs and Symptoms: Patient reports having:no discharge or purulent material 72 year old gentleman who has been referred to was from his PCP for a chronic ulceration on his right lower extremity which she's had for several weeks. Past medical history significant for diabetes mellitus type 2, hypertension, hyperlipidemia, anxiety, obesity, peripheral vascular disease and chronic kidney disease. He has never been a smoker. Most recent lab work done at his PCPs office showed a glucose of 217 milligrams per deciliter which is consistent with hyperglycemia. In January 2017 recent hemoglobin A1c values noted to be 8.2%. In April  2017, he has been seen at the vascular office by Dr. Trula Slade and Dr. Kellie Simmering for right leg claudication. He had a right superficial femoral artery stent in September 2012. Recent noninvasive vascular imaging done on 06/24/2015 showed a right ABI of 1.07 with a triphasic waveform and a right TBI of 0.81. The left was noncompressible with a triphasic waveform and a TBI of 1.17. Right lower extremity arterial duplex was unable to identify stent exit but they were elevated velocities at the stent and in the distal thigh with triphasic waveforms. Dr. Stephens Shire opinion was to return to the clinic in 6 months with ABI and right lower extremity arterial duplex studies to be done. He has seen a dermatologist who had done a biopsy and said it was benign and he was given a steroid cream. 10/23/2015 -- Pathology report of the biopsy done in April 2017 shows ulcer with underlying angiodermatitis consistent with stasis dermatitis. 10/30/2015 -- he is going for shoulder surgery in about 10 days' time and was asking about the perioperative care. His blood sugars are running in the high 100s or low 200s. No hemoglobin A1c done  recently. 11/20/2015 -- is back up to 2 weeks because of recent left shoulder surgery. 12/04/15; patient returns today with the wound for the most part looking healthy. No evidence of infection no debridement required. He is using Prisma for 4 weeks, without obvious improvement per her intake nurse. This started as several small open areas that were raised erythematous. He saw dermatology and at some point this was biopsied that just suggested stasis skin physiology. This certainly doesn't look like that 12/11/15; using Hydrafera Blue. Previous biopsy reviewed, no atypia PAS negative. He has a history of stents in the right leg however he does not appear to have primary arterial insufficiency ABI in this clinic was over 0.9. 12/25/2015 -- he has been approved for grafix and we will order for some to be applied next week 01/01/2016 -- he has had his first application of Grafix today 01/08/2016 -- he has had his second application of Grafix today READMISSION 12/04/2021 This is a now 72 year old man who was followed in our clinic about 5 years ago for an ulceration on his right lateral lower leg, just distal to the knee. He has since undergone a number of revascularization procedures that have been complicated by restenosis and wound infections. During the course of this treatment, he developed a small ulcer on his right anterior tibial surface. Despite use of an Unna boot, the wound has continued to expand. He was referred to the wound care center by Dr. Trula Slade for further evaluation and management. On the right anterior tibial surface, there is an irregular wound with heavy slough and eschar accumulation. The periwound skin is intact but he does have 1-2+ pitting edema. There is no purulent drainage or malodor. 10/13; second visit for this man who has an ischemic wound in the setting of type 2 diabetes on the right anterior mid tibia area. He has been revascularized. Using Santyl gent 12/22/2021:  The wound is about the same size this week. There is a lot of gray sloughy material on the surface, secondary to silver nitrate used to stop bleeding after what sounds like a fairly aggressive debridement last week. 12/30/2021: The wound is smaller this week and a bit cleaner. Edema control is excellent. There is still a bit of slough accumulation. 01/07/2022: The wound continues to contract and is much cleaner this week. Edema control is very good.  He has small openings in his upper leg surgical scars, but he is going to be seeing Dr. Trula Slade next Monday and will have him take a look. He has been applying manuka honey to both of the sites. We have been using Santyl and gentamicin on his lower leg wound. Michael [name], Michael [name] ([id number]) [id number] Page 7 of 11 01/14/2022: He saw vascular surgery this week and was told that they were happy to have [place] manage the 2 wounds from his operation. Both of these have a light layer of slough on the surface. The more distal wound is a little bit dry. The anterior tibial wound continues to accumulate a fair amount of slough. Edema control is good. 01/19/2022: Both upper leg wounds are smaller. The more distal is quite dry and cracked when I was examining it. The lower leg wound is more painful and the surrounding tissue is erythematous and indurated. 01/26/2022: The upper leg wounds are healed. The skin at the distal leg wound is quite dry. The lower leg wound is cleaner but still fairly painful. His wrap slid again this week. 02/02/2022: We had good success keeping his wrap intact using a standard Unna boot. The lower leg wound continues to contract and is clean and less painful this week. 12/11; TheraSkin #1 12/18; TheraSkin #2. Wound looks as though it is contracted. Patient states that his pain was better in the leg. Secondary dressing and kerlix Coban 02/24/2022: The wound is smaller since I saw it last. It is less painful and there  is good granulation tissue, including over the exposed tendon. 03/09/2022: The wound continues to contract. There is continued fill of the wound cavity with granulation tissue. 03/16/2022: The wound is smaller again today. There is good granulation tissue forming, covering the tendon. 03/30/2022: The wound continues to contract and fill with good granulation tissue. The granulation tissue is oozing a little bit at the lateral edge. 04/13/2022: We left his TheraSkin on an extra week because it was looking so good. The wound is smaller this week and flush with the surrounding skin. His medial thigh wound has opened up again. There is slough and eschar present. 04/20/2022: The anterior tibial wound continues to contract. There is good granulation tissue with just a light layer of slough. The medial thigh ulcer is superficial with slough present. It is a bit fibrotic due to being in a scar. 04/27/2022: The anterior tibial wound continues to contract. There is still some slough buildup with good granulation tissue. The medial thigh ulcer is very leathery and dry. Patient History Information obtained from Patient. Family History Cancer - Siblings, Heart Disease - Father, Hypertension - Father,Mother, No family history of Hereditary Spherocytosis, Kidney Disease, Seizures, Stroke, Thyroid Problems, Tuberculosis. Social History Former smoker - smokeless tobacco, Marital Status - Married, Alcohol Use - Rarely - WINE, Drug Use - No History, Caffeine Use - Daily - COFFEE. Medical History Ear/Nose/Mouth/Throat Patient has history of Chronic sinus problems/congestion - seasonal allergies Cardiovascular Patient has history of Hypertension, Peripheral Arterial Disease - STENTS, Peripheral Venous Disease Endocrine Patient has history of Type II Diabetes - last A1c- 8.2 Hospitalization/Surgery History - left shoulder surgery. Medical A Surgical History Notes nd Constitutional Symptoms (General Health) obesity ,  h/o (R) leg claudication (right fem stent September 2012) Cardiovascular hyperlipidemia Endocrine pt. on diet intentionally losing 20 pounds since December 2016 in an effort to improve A1C levels Integumentary (Skin) psoriatic arthritis Musculoskeletal bilat knee pain identified as an ortho issue psoriatic arthritis Oncologic Prostate  cancer 2018 Psychiatric anxiety Objective Constitutional no acute distress. Vitals Time Taken: 9:40 AM, Height: 72 in, Weight: 208 lbs, BMI: 28.2, Temperature: 97.9 F, Pulse: 96 bpm, Respiratory Rate: 18 breaths/min, Blood Pressure: 132/68 mmHg, Capillary Blood Glucose: 91 mg/dl. Michael [name], Michael [name] ([id number]) [id number] Page 8 of 11 Respiratory Normal work of breathing on room air. General Notes: 04/27/2022: The anterior tibial wound continues to contract. There is still some slough buildup with good granulation tissue. The medial thigh ulcer is very leathery and dry. Integumentary (Hair, Skin) Wound #3 status is Open. Original cause of wound was Blister. The date acquired was: 09/30/2021. The wound has been in treatment 20 weeks. The wound is located on the Right,Anterior Lower Leg. The wound measures 2cm length x 1.6cm width x 0.1cm depth; 2.513cm^2 area and 0.251cm^3 volume. There is Fat Layer (Subcutaneous Tissue) exposed. There is no tunneling or undermining noted. There is a medium amount of serosanguineous drainage noted. The wound margin is distinct with the outline attached to the wound base. There is large (67-100%) pink granulation within the wound bed. There is a small (1-33%) amount of necrotic tissue within the wound bed including Adherent Slough. The periwound skin appearance exhibited: Scarring, Dry/Scaly, Rubor. Periwound temperature was noted as No Abnormality. Wound #4 status is Open. Original cause of wound was Gradually Appeared. The date acquired was: 01/05/2022. The wound has been in treatment 15 weeks.  The wound is located on the Right,Medial Upper Leg. The wound measures 2cm length x 1cm width x 0.1cm depth; 1.571cm^2 area and 0.157cm^3 volume. There is Fat Layer (Subcutaneous Tissue) exposed. There is no tunneling or undermining noted. There is a medium amount of serosanguineous drainage noted. The wound margin is distinct with the outline attached to the wound base. There is small (1-33%) red, pink granulation within the wound bed. There is a large (67- 100%) amount of necrotic tissue within the wound bed including Eschar and Adherent Slough. The periwound skin appearance had no abnormalities noted for moisture. The periwound skin appearance had no abnormalities noted for color. The periwound skin appearance exhibited: Scarring. Periwound temperature was noted as No Abnormality. 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 There was a Selective/Open Wound Non-Viable Tissue Debridement with a total area of 3.2 sq cm performed by Fredirick Maudlin, MD. With the following instrument(s): Curette to remove Non-Viable tissue/material. Material removed includes [facility] after achieving pain control using Lidocaine 5% topical ointment. No specimens were taken. A time out was conducted at 09:54, prior to the start of the procedure. A Minimum amount of bleeding was controlled with Pressure. The procedure was tolerated well. Post Debridement Measurements: 2cm length x  1.6cm width x 0.1cm depth; 0.251cm^3 volume. Character of Wound/Ulcer Post Debridement requires further debridement. Severity of Tissue Post Debridement is: Fat layer exposed. Post procedure Diagnosis Wound #3: Same as Pre-Procedure General Notes: Scribed for Dr. Celine Ahr by Blanche East, RN. Wound #4 Pre-procedure diagnosis of Wound #4 is a Dehisced Wound located on the Right,Medial Upper Leg . There was a Excisional Skin/Subcutaneous Tissue  Debridement with a total area of 2 sq cm performed by Fredirick Maudlin, MD. With the following instrument(s): Curette to remove Non-Viable tissue/material. Material removed includes Subcutaneous Tissue and Slough and after achieving pain control using Lidocaine 5% topical ointment. No specimens were taken. A time out was conducted at 09:54, prior to the start of the procedure. A Minimum amount of bleeding was controlled with Pressure. The procedure was tolerated well. Post Debridement Measurements: 2cm length x 1cm width x 0.1cm depth; 0.157cm^3 volume. Character of Wound/Ulcer Post Debridement requires further debridement. Post procedure Diagnosis Wound #4: Same as Pre-Procedure General Notes: Scribed for Dr. Celine Ahr by Blanche East, RN. Plan Follow-up Appointments: Return Appointment in 1 week. - Dr. Celine Ahr - room 4 Anesthetic: Wound #3 Right,Anterior Lower Leg: (In clinic) Topical Lidocaine 4% applied to wound bed Edema Control - Lymphedema / SCD / Other: Avoid standing for long periods of time. WOUND #3: - Lower Leg Wound Laterality: Right, Anterior Cleanser: Soap and Water 1 x Per Week/30 Days Discharge Instructions: May shower and wash wound with dial antibacterial soap and water prior to dressing change. Cleanser: Wound Cleanser 1 x Per Week/30 Days Discharge Instructions: Cleanse the wound with wound cleanser prior to applying a clean dressing using gauze sponges, not tissue or cotton balls. Peri-Wound Care: Sween Lotion (Moisturizing lotion) 1 x  Per Week/30 Days Discharge Instructions: Apply moisturizing lotion as directed Prim Dressing: Endoform 2x2 in 1 x Per Week/30 Days ary Michael [name], Michael [name] ([id number]) [phone].pdf Page 9 of 11 Discharge Instructions: Moisten with saline Secondary Dressing: ABD Pad, 8x10 1 x Per Week/30 Days Discharge Instructions: Apply over primary dressing as directed. Secondary Dressing: Woven Gauze Sponge, Non-Sterile 4x4 in 1 x Per Week/30 Days Discharge Instructions: Apply over primary dressing as directed. Secured With: [employer] Size 5, 10 (yds) 1 x Per Week/30 Days Com pression Wrap: Kerlix Roll 4.5x3.1 (in/yd) 1 x Per Week/30 Days Discharge Instructions: Apply Kerlix and Coban compression as directed. Com pression Wrap: Coban Self-Adherent Wrap 4x5 (in/yd) 1 x Per Week/30 Days Discharge Instructions: Apply over Kerlix as directed. Com pression Wrap: Unnaboot w/Calamine, 4x10 (in/yd) 1 x Per Week/30 Days Discharge Instructions: Apply Unnaboot to top of dressing and around ankles to prevent dressing from sliding WOUND #4: - Upper Leg Wound Laterality: Right, Medial Cleanser: Soap and Water 1 x Per Week/30 Days Discharge Instructions: May shower and wash wound with dial antibacterial soap and water prior to dressing change. Cleanser: Wound Cleanser 1 x Per Week/30 Days Discharge Instructions: Cleanse the wound with wound cleanser prior to applying a clean dressing using gauze sponges, not tissue or cotton balls. Prim Dressing: Endoform 2x2 in 1 x Per Week/30 Days ary Discharge Instructions: Moisten with saline Secondary Dressing: Zetuvit Plus Silicone Border Dressing 4x4 (in/in) 1 x Per Week/30 Days Discharge Instructions: Apply silicone border over primary dressing as directed. 04/27/2022: The anterior tibial wound continues to contract. There is still some slough buildup with good granulation tissue. The medial thigh ulcer is very leathery and dry. I used a curette to  debride slough and nonviable subcutaneous tissue from the anterior leg wound. I debrided eschar from the medial thigh wound. We are going to use Prisma silver collagen moistened with hydrogel on the medial thigh wound to try and improve the moisture balance here. Continue endoform under Kerlix and Coban wrapping on the anterior tibial wound. Follow-up in 1 week. Electronic Signature(s) Signed: 04/27/2022 10:30:42 AM By: [name] Entered By: Fredirick Maudlin on 04/27/2022 10:30:42 --------------------------------------------------------------------------------  HxROS Details Patient Name: Date of Service: [name], [name] 04/27/2022 9:15 A M Medical Record Number: [id number] Patient Account Number: [id number] Date of Birth/Sex: Treating RN: 1950/11/14 (72 y.o. M) Primary Care Provider: [name] Other Clinician: Referring Provider: Treating Provider/Extender: [name] in Treatment: 20 Information Obtained From Patient Constitutional Symptoms (General Health) Medical History: Past Medical History Notes: obesity , h/o (R) leg claudication (right fem stent September 2012) Ear/Nose/Mouth/Throat Medical History: Positive for: Chronic sinus problems/congestion - seasonal allergies Cardiovascular Medical History: Positive for: Hypertension; Peripheral Arterial Disease - STENTS; Peripheral Venous Disease Past Medical History Notes: hyperlipidemia Endocrine Medical History: Positive for: Type II Diabetes - last A1c- 8.2 Past Medical History Notes: pt. on diet intentionally losing 20 pounds since December 2016 in an effort to improve A1C levels Time with diabetes: 9 YRS Treated with: Insulin Blood sugar tested every day: Yes Tested : [address] ([id number]) [id number] Page 10 of 11 Integumentary (Skin) Medical History: Past Medical History Notes: psoriatic  arthritis Musculoskeletal Medical History: Past Medical History Notes: bilat knee pain identified as an ortho issue psoriatic arthritis Oncologic Medical History: Past Medical History Notes: Prostate cancer 2018 Psychiatric Medical History: Past Medical History Notes: anxiety HBO Extended History Items Ear/Nose/Mouth/Throat: Chronic sinus problems/congestion Immunizations Pneumococcal Vaccine: Received Pneumococcal Vaccination: Yes Received Pneumococcal Vaccination On or After 60th Birthday: Yes Implantable Devices None Hospitalization / Surgery History Type of Hospitalization/Surgery left shoulder surgery Family and Social History Cancer: Yes - Siblings; Heart Disease: Yes - Father; Hereditary Spherocytosis: No; Hypertension: Yes - Father,Mother; Kidney Disease: No; Seizures: No; Stroke: No; Thyroid Problems: No; Tuberculosis: No; Former smoker - smokeless tobacco; Marital Status - Married; Alcohol Use: Rarely - WINE; Drug Use: No History; Caffeine Use: Daily - COFFEE; Financial Concerns: No; Food, Clothing or Shelter Needs: No; Support System Lacking: No; Transportation Concerns: No Electronic Signature(s) Signed: 04/27/2022 12:01:26 PM By: [name] Entered By: Fredirick Maudlin on 04/27/2022 10:29:15 -------------------------------------------------------------------------------- SuperBill Details Patient Name: Date of Service: [name], [name] 04/27/2022 Medical Record Number: [id number] Patient Account Number: [id number] Date of Birth/Sex: Treating RN: August 12, 1950 (72 y.o. 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 I70.239 Atherosclerosis of native arteries of right  leg with ulceration of unspecified site E11.622 Type 2 diabetes mellitus with other skin ulcer I73.9 Peripheral vascular disease, unspecified L97.122 Non-pressure chronic ulcer of left thigh with fat layer exposed Michael [name], Michael [name] ([id number].pdf Page 11 of 11 Facility Procedures : CPT4 Code Description: [id number] - DEB SUBQ TISSUE 20 SQ CM/< ICD-10 Diagnosis Description L97.815 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 involvemen Modifier: t without evidence Quantity: 1 of necrosis : CPT4 Code Description: [id number] - DEBRIDE WOUND 1ST 20 SQ CM OR < ICD-10 Diagnosis Description L97.122 Non-pressure chronic ulcer of left thigh with fat layer exposed Modifier: Quantity: 1 Physician Procedures : CPT4 Code Description Modifier [id number] - WC PHYS LEVEL 3 - EST PT 25 ICD-10 Diagnosis Description L97.815 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 without evidence L97.122 Non-pressure chronic ulcer of  left thigh with fat layer exposed I70.239 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

## 2022-04-28 NOTE — Progress Notes (Signed)
RAMAN, BLYDEN ([id number]) [id number] Page 1 of 9 Visit Report for 04/27/2022 Arrival Information Details Patient Name: Date of Service: HO FFMA [place], [name] 04/27/2022 9:15 A M Medical Record Number: [id number] Patient Account Number: [id number] Date of Birth/Sex: Treating RN: 1950-06-02 (72 y.o. [name] Primary Care Keefe Zawistowski: [name] Other Clinician: Referring Malorie Bigford: Treating Mahir Prabhakar/Extender: [name] in Treatment: 20 Visit Information History Since Last Visit Added or deleted any medications: No Patient Arrived: Ambulatory Any new allergies or adverse reactions: No Arrival Time: 09:40 Had a fall or experienced change in No Accompanied By: wife activities of daily living that may affect Transfer Assistance: None risk of falls: Patient Requires Transmission-Based Precautions: No Signs or symptoms of abuse/neglect since last visito No Patient Has Alerts: Yes Hospitalized since last visit: No Patient Alerts: Patient on Blood Thinner Implantable device outside of the clinic excluding No plavix cellular tissue based products placed in the center since last visit: Has Compression in Place as Prescribed: Yes Pain Present Now: Yes Electronic Signature(s) Signed: 04/27/2022 4:56:09 PM By: [name] Entered By: Blanche East on 04/27/2022 09:40:42 -------------------------------------------------------------------------------- Encounter Discharge Information Details Patient Name: Date of Service: [name], [name] 04/27/2022 9:15 A M Medical Record Number: [id number] Patient Account Number: [id number] Date of Birth/Sex: Treating RN: [birth year] (72 y.o. [name] Primary Care Kallee Nam: [name] Other Clinician: Referring Jhanae Jaskowiak: Treating Latresa Gasser/Extender: [name] in Treatment: 20 Encounter Discharge Information Items Post Procedure Vitals Discharge  Condition: Stable Temperature (F): 97.6 Ambulatory Status: Ambulatory Pulse (bpm): 96 Discharge Destination: Home Respiratory Rate (breaths/min): 18 Transportation: Private Auto Blood Pressure (mmHg): 146/80 Accompanied By: wife Schedule Follow-up Appointment: Yes Clinical Summary of Care: Electronic Signature(s) Signed: 04/27/2022 4:56:09 PM By: [name] Entered By: Blanche East on 04/27/2022 09:58:04 -------------------------------------------------------------------------------- Lower Extremity Assessment Details Patient Name: Date of Service: [name], [name] 04/27/2022 9:15 A M Medical Record Number: [id number] Patient Account Number: [id number] Date of Birth/Sex: Treating RN: 01/21/1951 (72 y.o. [name] Primary Care Elliette Seabolt: [name] Other Clinician: Referring Nysia Dell: Treating Velinda Wrobel/Extender: [name] in Treatment: 20 Edema Assessment Assessed: [Left: No] [Right: No] H[LeftMATEUS, MCFARLING C ([id number] [[id number] Page 2 of 9] Edema: [Left: Ye] [Right: s] Calf Left: Right: Point of Measurement: 35 cm From Medial Instep 34.4 cm Ankle Left: Right: Point of Measurement: 12 cm From Medial Instep 21 cm Vascular Assessment Pulses: Dorsalis Pedis Palpable: [Right:Yes] Electronic Signature(s) Signed: 04/27/2022 4:56:09 PM By: [name] Entered By: Blanche East on 04/27/2022 09:46:50 -------------------------------------------------------------------------------- Multi Wound Chart Details Patient Name: Date of Service: [name], [name] 04/27/2022 9:15 A M Medical Record Number: [id number] Patient Account Number: [id number] Date of Birth/Sex: Treating RN: 02-19-1951 (72 y.o. M) Primary Care Emiliano Welshans: [name] Other Clinician: Referring Timmie Dugue: Treating William Schake/Extender: [name] in Treatment: 20 Vital Signs Height(in):  35 Capillary Blood Glucose(mg/dl): 91 Weight(lbs): 208 Pulse(bpm): 52 Body Mass Index(BMI): 28.2 Blood Pressure(mmHg): 132/68 Temperature(F): 97.9 Respiratory Rate(breaths/min): 18 [3:Photos:] [N/A:N/A] Right, Anterior Lower Leg Right, Medial Upper Leg N/A Wound Location: Blister Gradually Appeared N/A Wounding Event: Diabetic Wound/Ulcer of the Lower Dehisced Wound N/A Primary Etiology: Extremity Chronic sinus problems/congestion, Chronic sinus problems/congestion, N/A Comorbid History: Hypertension, Peripheral Arterial Hypertension, Peripheral Arterial Disease, Peripheral Venous Disease, Disease, Peripheral Venous Disease, Type II Diabetes Type II Diabetes 09/30/2021 01/05/2022 N/A Date Acquired: 30 15 N/A Weeks of Treatment: Open Open N/A  Wound Status: No No N/A Wound Recurrence: 2x1.6x0.1 2x1x0.1 N/A Measurements L x W x D (cm) 2.513 1.571 N/A A (cm) : rea 0.251 0.157 N/A Volume (cm) : 85.70% N/A N/A % Reduction in Area: 95.20% N/A N/A % Reduction in Volume: Grade 2 Full Thickness Without Exposed N/A Classification: Support Structures Medium Medium N/A Exudate Amount: Serosanguineous Serosanguineous N/A Exudate Type: red, brown red, brown N/A Exudate Color: Distinct, outline attached Distinct, outline attached N/A Wound Margin: Large (67-100%) Small (1-33%) N/A Granulation Amount: Pink Red, Pink N/A Granulation QualityHARWOOD, DINSMOOR ([id number].pdf Page 3 of 9 Small (1-33%) Large (67-100%) N/A Necrotic Amount: Adherent Slough Eschar, Adherent Slough N/A Necrotic Tissue: Fat Layer (Subcutaneous Tissue): Yes Fat Layer (Subcutaneous Tissue): Yes N/A Exposed Structures: Fascia: No Fascia: No Tendon: No Tendon: No Muscle: No Muscle: No Joint: No Joint: No Bone: No Bone: No Small (1-33%) Small (1-33%) N/A Epithelialization: Debridement - Selective/Open Wound Debridement - Excisional  N/A Debridement: Pre-procedure Verification/Time Out 09:54 09:54 N/A Taken: Lidocaine 5% topical ointment Lidocaine 5% topical ointment N/A Pain Control: Slough Subcutaneous, Slough N/A Tissue Debrided: Non-Viable Tissue Skin/Subcutaneous Tissue N/A Level: 3.2 2 N/A Debridement A (sq cm): rea Curette Curette N/A Instrument: Minimum Minimum N/A Bleeding: Pressure Pressure N/A Hemostasis A chieved: Procedure was tolerated well Procedure was tolerated well N/A Debridement Treatment Response: 2x1.6x0.1 2x1x0.1 N/A Post Debridement Measurements L x W x D (cm) 0.251 0.157 N/A Post Debridement Volume: (cm) Scarring: Yes Scarring: Yes N/A Periwound Skin Texture: Dry/Scaly: Yes No Abnormalities Noted N/A Periwound Skin Moisture: Rubor: Yes No Abnormalities Noted N/A Periwound Skin Color: No Abnormality No Abnormality N/A Temperature: Debridement Debridement N/A Procedures Performed: Treatment Notes Wound #3 (Lower Leg) Wound Laterality: Right, Anterior Cleanser Soap and Water Discharge Instruction: May shower and wash wound with dial antibacterial soap and water prior to dressing change. Wound Cleanser Discharge Instruction: Cleanse the wound with wound cleanser prior to applying a clean dressing using gauze sponges, not tissue or cotton balls. Peri-Wound Care Sween Lotion (Moisturizing lotion) Discharge Instruction: Apply moisturizing lotion as directed Topical Primary Dressing Endoform 2x2 in Discharge Instruction: Moisten with saline Secondary Dressing ABD Pad, 8x10 Discharge Instruction: Apply over primary dressing as directed. Woven Gauze Sponge, Non-Sterile 4x4 in Discharge Instruction: Apply over primary dressing as directed. Secured With [employer] Size 5, 10 (yds) Compression Wrap Kerlix Roll 4.5x3.1 (in/yd) Discharge Instruction: Apply Kerlix and Coban compression as directed. Coban Self-Adherent Wrap 4x5 (in/yd) Discharge Instruction: Apply over  Kerlix as directed. Unnaboot w/Calamine, 4x10 (in/yd) Discharge Instruction: Apply Unnaboot to top of dressing and around ankles to prevent dressing from sliding Compression Stockings Add-Ons Wound #4 (Upper Leg) Wound Laterality: Right, Medial Cleanser Soap and Water Discharge Instruction: May shower and wash wound with dial antibacterial soap and water prior to dressing change. Wound Cleanser Discharge Instruction: Cleanse the wound with wound cleanser prior to applying a clean dressing using gauze sponges, not tissue or cotton balls. OSSIEL, PITZEN ([id number]) [id number] Page 4 of 9 Peri-Wound Care Topical Primary Dressing Endoform 2x2 in Discharge Instruction: Moisten with saline Secondary Dressing Zetuvit Plus Silicone Border Dressing 4x4 (in/in) Discharge Instruction: Apply silicone border over primary dressing as directed. Secured With Compression Wrap Compression Stockings Environmental education officer) Signed: 04/27/2022 10:28:19 AM By: [name] Entered By: Fredirick Maudlin on 04/27/2022 10:28:18 -------------------------------------------------------------------------------- Multi-Disciplinary Care Plan Details Patient Name: Date of Service: [name], [name] 04/27/2022 9:15 A M Medical Record Number: [id number] Patient Account Number: [id number] Date of  Birth/Sex: Treating RN: 13-Apr-1950 (72 y.o. [name] Primary Care Melani Brisbane: [name] Other Clinician: Referring Zylon Creamer: Treating Zarria Towell/Extender: [name] in Treatment: 20 Active Inactive Nutrition Nursing Diagnoses: Potential for alteratiion in Nutrition/Potential for imbalanced nutrition Goals: Patient/caregiver agrees to and verbalizes understanding of need to use nutritional supplements and/or vitamins as prescribed Date Initiated: 12/04/2021 Target Resolution Date: 05/29/2022 Goal Status: Active Patient/caregiver  verbalizes understanding of need to maintain therapeutic glucose control per primary care physician Date Initiated: 12/04/2021 Target Resolution Date: 05/29/2022 Goal Status: Active Interventions: Assess patient nutrition upon admission and as needed per policy Provide education on elevated blood sugars and impact on wound healing Provide education on nutrition Treatment Activities: Education provided on Nutrition : 01/19/2022 Notes: Wound/Skin Impairment Nursing Diagnoses: Impaired tissue integrity Knowledge deficit related to ulceration/compromised skin integrity Goals: Patient/caregiver will verbalize understanding of skin care regimen Date Initiated: 12/04/2021 Date Inactivated: 02/02/2022 Target Resolution Date: 01/28/2022 Goal Status: Met Ulcer/skin breakdown will have a volume reduction of 30% by week 4 Date Initiated: 12/04/2021 Date Inactivated: 01/07/2022 Target Resolution Date: 12/26/2021 DEKLYN, APPLEWHITE ([id number]) [id number].pdf Page 5 of 9 Goal Status: Met Ulcer/skin breakdown will have a volume reduction of 50% by week 8 Date Initiated: 01/07/2022 Date Inactivated: 03/16/2022 Target Resolution Date: 04/01/2022 Goal Status: Met Ulcer/skin breakdown will have a volume reduction of 80% by week 12 Date Initiated: 03/16/2022 Target Resolution Date: 05/29/2022 Goal Status: Active Interventions: Assess ulceration(s) every visit Provide education on ulcer and skin care Notes: Electronic Signature(s) Signed: 04/27/2022 4:56:09 PM By: [name] Entered By: Blanche East on 04/27/2022 09:56:52 -------------------------------------------------------------------------------- Pain Assessment Details Patient Name: Date of Service: [name], [name] 04/27/2022 9:15 A M Medical Record Number: [id number] Patient Account Number: [id number] Date of Birth/Sex: Treating RN: 09-Oct-1950 (72 y.o. [name] Primary Care Tron Flythe: [name] Other  Clinician: Referring Lyncoln Ledgerwood: Treating Jodye Scali/Extender: [name] in Treatment: 20 Active Problems Location of Pain Severity and Description of Pain Patient Has Paino No Site Locations Rate the pain. Current Pain Level: 0 Pain Management and Medication Current Pain Management: Electronic Signature(s) Signed: 04/27/2022 4:56:09 PM By: [name] Entered By: Blanche East on 04/27/2022 09:41:15 -------------------------------------------------------------------------------- Patient/Caregiver Education Details Patient Name: Date of Service: [name], [name] 2/26/2024andnbsp9:15 [place] Record Number: [id number] Patient Account Number: [id number] Date of Birth/Gender: Treating RN: 1950-03-21 (72 y.o. [name] Primary Care Physician: [name] Other Clinician: Referring Physician: Treating Physician/Extender: [name] in Treatment: [address] [place] ([id number]) [id number] Page 6 of 9 Education Assessment Education Provided To: Patient Education Topics Provided Wound Debridement: Methods: Explain/Verbal Responses: Reinforcements needed, State content correctly Wound/Skin Impairment: Methods: Explain/Verbal Responses: Reinforcements needed, State content correctly Electronic Signature(s) Signed: 04/27/2022 4:56:09 PM By: [name] Entered By: Blanche East on 04/27/2022 09:57:15 -------------------------------------------------------------------------------- Wound Assessment Details Patient Name: Date of Service: [name], [name] 04/27/2022 9:15 A M Medical Record Number: [id number] Patient Account Number: [id number] Date of Birth/Sex: Treating RN: [birth year] (72 y.o. [name] Primary Care Jalilah Wiltsie: [name] Other Clinician: Referring Nachman Sundt: Treating Rumaisa Schnetzer/Extender: [name] in Treatment: 20 Wound  Status Wound Number: 3 Primary Diabetic Wound/Ulcer of the Lower Extremity Etiology: Wound Location: Right, Anterior Lower Leg Wound Open Wounding Event: Blister Status: Date Acquired: 09/30/2021 Comorbid Chronic sinus problems/congestion, Hypertension, Peripheral Weeks Of Treatment: 20 History: Arterial Disease, Peripheral Venous Disease, Type II Diabetes Clustered Wound: No Photos  Wound Measurements Length: (cm) 2 Width: (cm) 1.6 Depth: (cm) 0.1 Area: (cm) 2.513 Volume: (cm) 0.251 % Reduction in Area: 85.7% % Reduction in Volume: 95.2% Epithelialization: Small (1-33%) Tunneling: No Undermining: No Wound Description Classification: Grade 2 Wound Margin: Distinct, outline attached Exudate Amount: Medium Exudate Type: Serosanguineous Exudate Color: red, brown Foul Odor After Cleansing: No Slough/Fibrino Yes Wound Bed Granulation Amount: Large (67-100%) Exposed Structure Granulation Quality: Pink Fascia Exposed: No Necrotic Amount: Small (1-33%) Fat Layer (Subcutaneous Tissue) Exposed: Yes JERQUAN, BLEYER ([id number] Page 7 of 9 Necrotic Quality: Adherent Slough Tendon Exposed: No Muscle Exposed: No Joint Exposed: No Bone Exposed: No Periwound Skin Texture Texture Color No Abnormalities Noted: No No Abnormalities Noted: No Scarring: Yes Rubor: Yes Moisture Temperature / Pain No Abnormalities Noted: No Temperature: No Abnormality Dry / Scaly: Yes Treatment Notes Wound #3 (Lower Leg) Wound Laterality: Right, Anterior Cleanser Soap and Water Discharge Instruction: May shower and wash wound with dial antibacterial soap and water prior to dressing change. Wound Cleanser Discharge Instruction: Cleanse the wound with wound cleanser prior to applying a clean dressing using gauze sponges, not tissue or cotton balls. Peri-Wound Care Sween Lotion (Moisturizing lotion) Discharge Instruction: Apply moisturizing lotion as  directed Topical Primary Dressing Endoform 2x2 in Discharge Instruction: Moisten with saline Secondary Dressing ABD Pad, 8x10 Discharge Instruction: Apply over primary dressing as directed. Woven Gauze Sponge, Non-Sterile 4x4 in Discharge Instruction: Apply over primary dressing as directed. Secured With [employer] Size 5, 10 (yds) Compression Wrap Kerlix Roll 4.5x3.1 (in/yd) Discharge Instruction: Apply Kerlix and Coban compression as directed. Coban Self-Adherent Wrap 4x5 (in/yd) Discharge Instruction: Apply over Kerlix as directed. Unnaboot w/Calamine, 4x10 (in/yd) Discharge Instruction: Apply Unnaboot to top of dressing and around ankles to prevent dressing from sliding Compression Stockings Add-Ons Electronic Signature(s) Signed: 04/27/2022 4:56:09 PM By: [name] Entered By: Blanche East on 04/27/2022 09:49:19 -------------------------------------------------------------------------------- Wound Assessment Details Patient Name: Date of Service: [name], [name] 04/27/2022 9:15 A M Medical Record Number: [id number] Patient Account Number: [id number] Date of Birth/Sex: Treating RN: 07/31/1950 (72 y.o. [name] Primary Care Raygen Linquist: [name] Other Clinician: Referring Birdie Beveridge: Treating Janellie Tennison/Extender: [name] in Treatment: 20 Wound Status Wound Number: 4 Primary Dehisced Wound Etiology: Wound Location: Right, Medial Upper Leg Mckain, Eder C ([id number]) [id number] Page 8 of 9 Wound Open Wounding Event: Gradually Appeared Status: Date Acquired: 01/05/2022 Comorbid Chronic sinus problems/congestion, Hypertension, Peripheral Weeks Of Treatment: 15 History: Arterial Disease, Peripheral Venous Disease, Type II Diabetes Clustered Wound: No Photos Wound Measurements Length: (cm) 2 Width: (cm) 1 Depth: (cm) 0.1 Area: (cm) 1.571 Volume: (cm) 0.157 % Reduction in Area: %  Reduction in Volume: Epithelialization: Small (1-33%) Tunneling: No Undermining: No Wound Description Classification: Full Thickness Without Exposed Support Structures Wound Margin: Distinct, outline attached Exudate Amount: Medium Exudate Type: Serosanguineous Exudate Color: red, brown Foul Odor After Cleansing: No Slough/Fibrino Yes Wound Bed Granulation Amount: Small (1-33%) Exposed Structure Granulation Quality: Red, Pink Fascia Exposed: No Necrotic Amount: Large (67-100%) Fat Layer (Subcutaneous Tissue) Exposed: Yes Necrotic Quality: Eschar, Adherent Slough Tendon Exposed: No Muscle Exposed: No Joint Exposed: No Bone Exposed: No Periwound Skin Texture Texture Color No Abnormalities Noted: No No Abnormalities Noted: Yes Scarring: Yes Temperature / Pain Temperature: No Abnormality Moisture No Abnormalities Noted: Yes Treatment Notes Wound #4 (Upper Leg) Wound Laterality: Right, Medial Cleanser Soap and Water Discharge Instruction: May shower and wash wound with dial antibacterial soap and water prior to dressing  change. Wound Cleanser Discharge Instruction: Cleanse the wound with wound cleanser prior to applying a clean dressing using gauze sponges, not tissue or cotton balls. Peri-Wound Care Topical Primary Dressing Endoform 2x2 in Discharge Instruction: Moisten with saline Secondary Dressing Zetuvit Plus Silicone Border Dressing 4x4 (in/in) Discharge Instruction: Apply silicone border over primary dressing as directed. Secured With ODYS[employer], BOULWARE ([id number]) [id number] Page 9 of 9 Compression Wrap Compression Stockings Add-Ons Electronic Signature(s) Signed: 04/27/2022 4:56:09 PM By: [name] Entered By: Blanche East on 04/27/2022 09:49:47 -------------------------------------------------------------------------------- Vitals Details Patient Name: Date of Service: [name], [name] 04/27/2022 9:15 A M Medical  Record Number: [id number] Patient Account Number: [id number] Date of Birth/Sex: Treating RN: [birth year] (72 y.o. [name] Primary Care Jaron Czarnecki: [name] Other Clinician: Referring Garrett Mitchum: Treating Katoya Amato/Extender: [name] in Treatment: 20 Vital Signs Time Taken: 09:40 Temperature (F): 97.9 Height (in): 72 Pulse (bpm): 96 Weight (lbs): 208 Respiratory Rate (breaths/min): 18 Body Mass Index (BMI): 28.2 Blood Pressure (mmHg): 132/68 Capillary Blood Glucose (mg/dl): 91 Reference Range: 80 - 120 mg / dl Electronic Signature(s) Signed: 04/27/2022 4:56:09 PM By: [name] Entered By: Blanche East on 04/27/2022 09:41:08

## 2022-05-04 ENCOUNTER — Encounter (HOSPITAL_BASED_OUTPATIENT_CLINIC_OR_DEPARTMENT_OTHER): Payer: Medicare Other | Attending: General Surgery | Admitting: General Surgery

## 2022-05-04 DIAGNOSIS — T8131XA Disruption of external operation (surgical) wound, not elsewhere classified, initial encounter: Secondary | ICD-10-CM | POA: Diagnosis not present

## 2022-05-04 DIAGNOSIS — I129 Hypertensive chronic kidney disease with stage 1 through stage 4 chronic kidney disease, or unspecified chronic kidney disease: Secondary | ICD-10-CM | POA: Insufficient documentation

## 2022-05-04 DIAGNOSIS — L97815 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: Secondary | ICD-10-CM | POA: Insufficient documentation

## 2022-05-04 DIAGNOSIS — E1122 Type 2 diabetes mellitus with diabetic chronic kidney disease: Secondary | ICD-10-CM | POA: Insufficient documentation

## 2022-05-04 DIAGNOSIS — E1151 Type 2 diabetes mellitus with diabetic peripheral angiopathy without gangrene: Secondary | ICD-10-CM | POA: Diagnosis not present

## 2022-05-04 DIAGNOSIS — N189 Chronic kidney disease, unspecified: Secondary | ICD-10-CM | POA: Diagnosis not present

## 2022-05-04 DIAGNOSIS — E669 Obesity, unspecified: Secondary | ICD-10-CM | POA: Insufficient documentation

## 2022-05-04 DIAGNOSIS — I70239 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: Secondary | ICD-10-CM | POA: Insufficient documentation

## 2022-05-04 DIAGNOSIS — E11622 Type 2 diabetes mellitus with other skin ulcer: Secondary | ICD-10-CM | POA: Insufficient documentation

## 2022-05-04 DIAGNOSIS — L97122 Non-pressure chronic ulcer of left thigh with fat layer exposed: Secondary | ICD-10-CM | POA: Insufficient documentation

## 2022-05-04 DIAGNOSIS — L97812 Non-pressure chronic ulcer of other part of right lower leg with fat layer exposed: Secondary | ICD-10-CM | POA: Diagnosis not present

## 2022-05-04 DIAGNOSIS — Z6828 Body mass index (BMI) 28.0-28.9, adult: Secondary | ICD-10-CM | POA: Insufficient documentation

## 2022-05-06 NOTE — Progress Notes (Signed)
ABIMAEL, DOHRN ([id number]) [id number] Page 1 of 11 Visit Report for 05/04/2022 Chief Complaint Document Details Patient Name: Date of Service: Michael FFMA [place], [name] 05/04/2022 9:15 A M Medical Record Number: [id number] Patient Account Number: [id number] Date of Birth/Sex: Treating RN: Aug 03, 1950 (72 y.o. M) Primary Care Provider: Karren [name] Other Clinician: Referring Provider: Treating Provider/Extender: Michael [name] in Treatment: 21 Information Obtained from: Patient Chief Complaint Patient presents to the wound care center today with an open arterial ulcer to the right lower extremity in the setting of diabetes mellitus. he has had this problem for 7 months 12/04/2021: ulcer to right lower anterior tibial surface Electronic Signature(s) Signed: 05/04/2022 10:48:30 AM By: Michael Maudlin MD FACS Entered By: Michael [name] on 05/04/2022 10:48:30 -------------------------------------------------------------------------------- Debridement Details Patient Name: Date of Service: Michael [name], Michael LTER C. 05/04/2022 9:15 A M Medical Record Number: [id number] Patient Account Number: [id number] Date of Birth/Sex: Treating RN: [birth year] (72 y.o. Michael [name] Primary Care Provider: Karren [name] Other Clinician: Referring Provider: Treating Provider/Extender: Michael [name] in Treatment: 21 Debridement Performed for Assessment: Wound #3 Right,Anterior Lower Leg Performed By: Physician Michael Maudlin, MD Debridement Type: Debridement Severity of Tissue Pre Debridement: Fat layer exposed Level of Consciousness (Pre-procedure): Awake and Alert Pre-procedure Verification/Time Out Yes - 09:45 Taken: Start Time: 09:45 Pain Control: Lidocaine 4% T opical Solution T Area Debrided (L x W): otal 1.8 (cm) x 1.5 (cm) = 2.7 (cm) Tissue and other material debrided: Eschar, Slough, Slough Level: Non-Viable  Tissue Debridement Description: Selective/Open Wound Instrument: Curette Bleeding: Minimum Hemostasis Achieved: Pressure End Time: 09:46 Procedural Pain: 0 Post Procedural Pain: 0 Response to Treatment: Procedure was tolerated well Level of Consciousness (Post- Awake and Alert procedure): Post Debridement Measurements of Total Wound Length: (cm) 1.8 Width: (cm) 1.5 Depth: (cm) 0.1 Volume: (cm) 0.212 Character of Wound/Ulcer Post Debridement: Improved Severity of Tissue Post Debridement: Fat layer exposed Post Procedure Diagnosis Same as Pre-procedure Notes Scribed for Dr. Celine Ahr by Michael [name], [place] ([id number]) [phone].pdf Page 2 of 11 Electronic Signature(s) Signed: 05/04/2022 12:33:57 PM By: Michael Maudlin MD FACS Signed: 05/04/2022 6:24:12 PM By: Michael Catholic RN Entered By: Michael [name] on 05/04/2022 09:54:29 -------------------------------------------------------------------------------- Debridement Details Patient Name: Date of Service: Michael [name], Michael LTER C. 05/04/2022 9:15 A M Medical Record Number: [id number] Patient Account Number: [id number] Date of Birth/Sex: Treating RN: [birth year] (72 y.o. Michael [name] Primary Care Provider: Karren [name] Other Clinician: Referring Provider: Treating Provider/Extender: Michael [name] in Treatment: 21 Debridement Performed for Assessment: Wound #4 Right,Medial Upper Leg Performed By: Physician Michael Maudlin, MD Debridement Type: Debridement Level of Consciousness (Pre-procedure): Awake and Alert Pre-procedure Verification/Time Out Yes - 09:45 Taken: Start Time: 09:45 Pain Control: Lidocaine 4% Topical Solution T Area Debrided (L x W): otal 2 (cm) x 1 (cm) = 2 (cm) Tissue and other material debrided: Non-Viable, Eschar Level: Non-Viable Tissue Debridement Description: Selective/Open Wound Instrument: Curette Bleeding: Minimum Hemostasis Achieved:  Pressure End Time: 09:46 Procedural Pain: 0 Post Procedural Pain: 0 Response to Treatment: Procedure was tolerated well Level of Consciousness (Post- Awake and Alert procedure): Post Debridement Measurements of Total Wound Length: (cm) 2 Width: (cm) 1 Depth: (cm) 0.1 Volume: (cm) 0.157 Character of Wound/Ulcer Post Debridement: Improved Post Procedure Diagnosis Same as Pre-procedure Notes Scribed for Dr. Celine Ahr by Michael Electronic Signature(s) Signed: 05/04/2022 12:33:57 PM By: Michael Maudlin MD FACS Signed: 05/04/2022 6:24:12 PM By: Michael Catholic RN Entered By: Michael [name]  on 05/04/2022 09:56:13 -------------------------------------------------------------------------------- HPI Details Patient Name: Date of Service: Michael [name], [name] 05/04/2022 9:15 A M Medical Record Number: [id number] Patient Account Number: [id number] Date of Birth/Sex: Treating RN: 28-Nov-1950 (72 y.o. M) Primary Care Provider: Karren [name] Other Clinician: Referring Provider: Treating Provider/Extender: Michael [name] in Treatment: 21 History of Present Illness Location: right lateral calf closer to the knee Quality: Patient reports experiencing a dull pain to affected area(s). Severity: Patient states wound(s) are getting worse. Duration: Patient has had the wound for > 7 months prior to seeking treatment at the wound center Timing: Pain in wound is constant (hurts all the time) Michael [name] ([id number]) [id number] Page 3 of 11 Context: The wound would happen gradually Modifying Factors: Patient wound(s)/ulcer(s) are worsening due to : no resolution and a white material at the base of the wound ssociated Signs and Symptoms: Patient reports having:no discharge or purulent material A HPI Description: 72 year old gentleman who has been referred to was from his PCP for a chronic ulceration on his right lower extremity which she's had  for several weeks. Past medical history significant for diabetes mellitus type 2, hypertension, hyperlipidemia, anxiety, obesity, peripheral vascular disease and chronic kidney disease. He has never been a smoker. Most recent lab work done at his PCPs office showed a glucose of 217 milligrams per deciliter which is consistent with hyperglycemia. In January 2017 recent hemoglobin A1c values noted to be 8.2%. In April 2017, he has been seen at the vascular office by Dr. Trula Slade and Dr. Kellie Simmering for right leg claudication. He had a right superficial femoral artery stent in September 2012. Recent noninvasive vascular imaging done on 06/24/2015 showed a right ABI of 1.07 with a triphasic waveform and a right TBI of 0.81. The left was noncompressible with a triphasic waveform and a TBI of 1.17. Right lower extremity arterial duplex was unable to identify stent exit but they were elevated velocities at the stent and in the distal thigh with triphasic waveforms. Dr. Stephens Shire opinion was to return to the clinic in 6 months with ABI and right lower extremity arterial duplex studies to be done. He has seen a dermatologist who had done a biopsy and said it was benign and he was given a steroid cream. 10/23/2015 -- Pathology report of the biopsy done in April 2017 shows ulcer with underlying angiodermatitis consistent with stasis dermatitis. 10/30/2015 -- he is going for shoulder surgery in about 10 days' time and was asking about the perioperative care. His blood sugars are running in the high 100s or low 200s. No hemoglobin A1c done recently. 11/20/2015 -- is back up to 2 weeks because of recent left shoulder surgery. 12/04/15; patient returns today with the wound for the most part looking healthy. No evidence of infection no debridement required. He is using Prisma for 4 weeks, without obvious improvement per her intake nurse. This started as several small open areas that were raised erythematous. He saw  dermatology and at some point this was biopsied that just suggested stasis skin physiology. This certainly doesn't look like that 12/11/15; using Hydrafera Blue. Previous biopsy reviewed, no atypia PAS negative. He has a history of stents in the right leg however he does not appear to have primary arterial insufficiency ABI in this clinic was over 0.9. 12/25/2015 -- he has been approved for grafix and we will order for some to be applied next week 01/01/2016 -- he has had his first application of Grafix today 01/08/2016 --  he has had his second application of Grafix today READMISSION 12/04/2021 This is a now 72 year old man who was followed in our clinic about 5 years ago for an ulceration on his right lateral lower leg, just distal to the knee. He has since undergone a number of revascularization procedures that have been complicated by restenosis and wound infections. During the course of this treatment, he developed a small ulcer on his right anterior tibial surface. Despite use of an Unna boot, the wound has continued to expand. He was referred to the wound care center by Dr. Trula Slade for further evaluation and management. On the right anterior tibial surface, there is an irregular wound with heavy slough and eschar accumulation. The periwound skin is intact but he does have 1-2+ pitting edema. There is no purulent drainage or malodor. 10/13; second visit for this man who has an ischemic wound in the setting of type 2 diabetes on the right anterior mid tibia area. He has been revascularized. Using Santyl gent 12/22/2021: The wound is about the same size this week. There is a lot of gray sloughy material on the surface, secondary to silver nitrate used to stop bleeding after what sounds like a fairly aggressive debridement last week. 12/30/2021: The wound is smaller this week and a bit cleaner. Edema control is excellent. There is still a bit of slough accumulation. 01/07/2022: The wound  continues to contract and is much cleaner this week. Edema control is very good. He has small openings in his upper leg surgical scars, but he is going to be seeing Dr. Trula Slade next Monday and will have him take a look. He has been applying manuka honey to both of the sites. We have been using Santyl and gentamicin on his lower leg wound. 01/14/2022: He saw vascular surgery this week and was told that they were happy to have [place] manage the 2 wounds from his operation. Both of these have a light layer of slough on the surface. The more distal wound is a little bit dry. The anterior tibial wound continues to accumulate a fair amount of slough. Edema control is good. 01/19/2022: Both upper leg wounds are smaller. The more distal is quite dry and cracked when I was examining it. The lower leg wound is more painful and the surrounding tissue is erythematous and indurated. 01/26/2022: The upper leg wounds are healed. The skin at the distal leg wound is quite dry. The lower leg wound is cleaner but still fairly painful. His wrap slid again this week. 02/02/2022: We had good success keeping his wrap intact using a standard Unna boot. The lower leg wound continues to contract and is clean and less painful this week. 12/11; TheraSkin #1 12/18; TheraSkin #2. Wound looks as though it is contracted. Patient states that his pain was better in the leg. Secondary dressing and kerlix Coban 02/24/2022: The wound is smaller since I saw it last. It is less painful and there is good granulation tissue, including over the exposed tendon. 03/09/2022: The wound continues to contract. There is continued fill of the wound cavity with granulation tissue. 03/16/2022: The wound is smaller again today. There is good granulation tissue forming, covering the tendon. 03/30/2022: The wound continues to contract and fill with good granulation tissue. The granulation tissue is oozing a little bit at the lateral edge. 04/13/2022: We left his  TheraSkin on an extra week because it was looking so good. The wound is smaller this week and flush with the surrounding skin. His  medial thigh wound has opened up again. There is slough and eschar present. 04/20/2022: The anterior tibial wound continues to contract. There is good granulation tissue with just a light layer of slough. The medial thigh ulcer is superficial with slough present. It is a bit fibrotic due to being in a scar. 04/27/2022: The anterior tibial wound continues to contract. There is still some slough buildup with good granulation tissue. The medial thigh ulcer is very leathery and dry. 05/04/2022: The anterior tibial wound is even smaller today. There is no visible tendon at all. He has some slough and eschar accumulation. The medial thigh ulcer continues to be quite dry with a leathery surface. Michael [name] ([id number]) [id number] Page 4 of 11 Electronic Signature(s) Signed: 05/04/2022 10:49:12 AM By: Michael Maudlin MD FACS Entered By: Michael [name] on 05/04/2022 10:49:12 -------------------------------------------------------------------------------- Physical Exam Details Patient Name: Date of Service: Michael [name], Michael LTER C. 05/04/2022 9:15 A M Medical Record Number: [id number] Patient Account Number: [id number] Date of Birth/Sex: Treating RN: Apr 03, 1950 (72 y.o. M) Primary Care Provider: Karren [name] Other Clinician: Referring Provider: Treating Provider/Extender: Michael [name] in Treatment: 21 Constitutional . . . . no acute distress. Respiratory Normal work of breathing on room air. Notes 05/04/2022: The anterior tibial wound is even smaller today. There is no visible tendon at all. He has some slough and eschar accumulation. The medial thigh ulcer continues to be quite dry with a leathery surface. Electronic Signature(s) Signed: 05/04/2022 10:51:40 AM By: Michael Maudlin MD FACS Entered By: Michael [name]  on 05/04/2022 10:51:40 -------------------------------------------------------------------------------- Physician Orders Details Patient Name: Date of Service: Michael [name], Michael LTER C. 05/04/2022 9:15 A M Medical Record Number: [id number] Patient Account Number: [id number] Date of Birth/Sex: Treating RN: 01/03/1951 (72 y.o. Michael [name] Primary Care Provider: Karren [name] Other Clinician: Referring Provider: Treating Provider/Extender: Michael [name] in Treatment: 21 Verbal / Phone Orders: No Diagnosis Coding ICD-10 Coding Code Description [phone] 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 I70.239 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 E11.622 Type 2 diabetes mellitus with other skin ulcer I73.9 Peripheral vascular disease, unspecified L97.122 Non-pressure chronic ulcer of left thigh with fat layer exposed Follow-up Appointments ppointment in 1 week. - Dr. Celine Ahr - Room 3 Return A Other: - Doctor's note to return back to work (05/04/22) Anesthetic Wound #3 Right,Anterior Lower Leg (In clinic) Topical Lidocaine 4% applied to wound bed Edema Control - Lymphedema / SCD / Other Right Lower Extremity Avoid standing for long periods of time. Wound Treatment Wound #3 - Lower Leg Wound Laterality: Right, Anterior Cleanser: Soap and Water 1 x Per Week/30 Days Discharge Instructions: May shower and wash wound with dial antibacterial soap and water prior to dressing change. Cleanser: Wound Cleanser 1 x Per Week/30 Days Discharge Instructions: Cleanse the wound with wound cleanser prior to applying a clean dressing using gauze sponges, not tissue or cotton balls. Michael [name] ([id number]) [id number] Page 5 of 11 Peri-Wound Care: Sween Lotion (Moisturizing lotion) 1 x Per Week/30 Days Discharge Instructions: Apply  moisturizing lotion as directed Prim Dressing: Endoform 2x2 in 1 x Per Week/30 Days ary Discharge Instructions: Moisten with saline Secondary Dressing: ABD Pad, 8x10 1 x Per Week/30 Days Discharge Instructions: Apply over primary dressing as directed. Secondary Dressing: Woven Gauze Sponge, Non-Sterile 4x4 in 1 x Per Week/30 Days Discharge Instructions: Apply over primary dressing as  directed. Secured With: [employer] Size 5, 10 (yds) 1 x Per Week/30 Days Compression Wrap: Kerlix Roll 4.5x3.1 (in/yd) 1 x Per Week/30 Days Discharge Instructions: Apply Kerlix and Coban compression as directed. Compression Wrap: Coban Self-Adherent Wrap 4x5 (in/yd) 1 x Per Week/30 Days Discharge Instructions: Apply over Kerlix as directed. Compression Wrap: Unnaboot w/Calamine, 4x10 (in/yd) 1 x Per Week/30 Days Discharge Instructions: Apply Unnaboot to top of dressing and around ankles to prevent dressing from sliding Wound #4 - Upper Leg Wound Laterality: Right, Medial Cleanser: Soap and Water 1 x Per Week/30 Days Discharge Instructions: May shower and wash wound with dial antibacterial soap and water prior to dressing change. Cleanser: Wound Cleanser 1 x Per Week/30 Days Discharge Instructions: Cleanse the wound with wound cleanser prior to applying a clean dressing using gauze sponges, not tissue or cotton balls. Prim Dressing: Promogran Prisma Matrix, 4.34 (sq in) (silver collagen) ary 1 x Per Week/30 Days Discharge Instructions: Moisten collagen with hydrogel Secondary Dressing: Zetuvit Plus Silicone Border Dressing 4x4 (in/in) 1 x Per Week/30 Days Discharge Instructions: Apply silicone border over primary dressing as directed. Electronic Signature(s) Signed: 05/04/2022 12:33:57 PM By: Michael Maudlin MD FACS Entered By: Michael [name] on 05/04/2022 10:51:55 -------------------------------------------------------------------------------- Problem List Details Patient Name: Date of Service: Michael FFMA  [name], Michael LTER C. 05/04/2022 9:15 A M Medical Record Number: [id number] Patient Account Number: [id number] Date of Birth/Sex: Treating RN: 01-31-1951 (72 y.o. M) Primary Care Provider: Karren [name] Other Clinician: Referring Provider: Treating Provider/Extender: Michael [name] in Treatment: 21 Active Problems ICD-10 Encounter Code Description Active Date MDM Diagnosis L97.815 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 12/04/2021 No Yes involvement without evidence of necrosis I70.239 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 12/04/2021 No Yes E11.622 Type 2 diabetes mellitus with other skin ulcer 12/04/2021 No Yes I73.9 Peripheral vascular disease, unspecified 12/04/2021 No Yes Thurman, Ryleigh C ([id number]) [phone].pdf Page 6 of 11 L97.122 Non-pressure chronic ulcer of left thigh with fat layer exposed 01/14/2022 No Yes Inactive Problems Resolved Problems Electronic Signature(s) Signed: 05/04/2022 10:48:12 AM By: Michael Maudlin MD FACS Entered By: Michael [name] on 05/04/2022 10:48:12 -------------------------------------------------------------------------------- Progress Note Details Patient Name: Date of Service: Michael [name], Michael LTER C. 05/04/2022 9:15 A M Medical Record Number: [id number] Patient Account Number: [id number] Date of Birth/Sex: Treating RN: 1950-03-23 (72 y.o. M) Primary Care Provider: Karren [name] Other Clinician: Referring Provider: Treating Provider/Extender: Michael [name] in Treatment: 21 Subjective Chief Complaint Information obtained from Patient Patient presents to the wound care center today with an open arterial ulcer to the right lower extremity in the setting of diabetes mellitus. he has had this problem for 7 months 12/04/2021: ulcer to right lower anterior tibial surface History of Present Illness (HPI) The  following HPI elements were documented for the patient's wound: Location: right lateral calf closer to the knee Quality: Patient reports experiencing a dull pain to affected area(s). Severity: Patient states wound(s) are getting worse. Duration: Patient has had the wound for > 7 months prior to seeking treatment at the wound center Timing: Pain in wound is constant (hurts all the time) Context: The wound would happen gradually Modifying Factors: Patient wound(s)/ulcer(s) are worsening due to : no resolution and a white material at the base of the wound Associated Signs and Symptoms: Patient reports having:no discharge or purulent material 72 year old gentleman who has been referred to was from his PCP for a chronic  ulceration on his right lower extremity which she's had for several weeks. Past medical history significant for diabetes mellitus type 2, hypertension, hyperlipidemia, anxiety, obesity, peripheral vascular disease and chronic kidney disease. He has never been a smoker. Most recent lab work done at his PCPs office showed a glucose of 217 milligrams per deciliter which is consistent with hyperglycemia. In January 2017 recent hemoglobin A1c values noted to be 8.2%. In April 2017, he has been seen at the vascular office by Dr. Trula Slade and Dr. Kellie Simmering for right leg claudication. He had a right superficial femoral artery stent in September 2012. Recent noninvasive vascular imaging done on 06/24/2015 showed a right ABI of 1.07 with a triphasic waveform and a right TBI of 0.81. The left was noncompressible with a triphasic waveform and a TBI of 1.17. Right lower extremity arterial duplex was unable to identify stent exit but they were elevated velocities at the stent and in the distal thigh with triphasic waveforms. Dr. Stephens Shire opinion was to return to the clinic in 6 months with ABI and right lower extremity arterial duplex studies to be done. He has seen a dermatologist who had done a biopsy  and said it was benign and he was given a steroid cream. 10/23/2015 -- Pathology report of the biopsy done in April 2017 shows ulcer with underlying angiodermatitis consistent with stasis dermatitis. 10/30/2015 -- he is going for shoulder surgery in about 10 days' time and was asking about the perioperative care. His blood sugars are running in the high 100s or low 200s. No hemoglobin A1c done recently. 11/20/2015 -- is back up to 2 weeks because of recent left shoulder surgery. 12/04/15; patient returns today with the wound for the most part looking healthy. No evidence of infection no debridement required. He is using Prisma for 4 weeks, without obvious improvement per her intake nurse. This started as several small open areas that were raised erythematous. He saw dermatology and at some point this was biopsied that just suggested stasis skin physiology. This certainly doesn't look like that 12/11/15; using Hydrafera Blue. Previous biopsy reviewed, no atypia PAS negative. He has a history of stents in the right leg however he does not appear to have primary arterial insufficiency ABI in this clinic was over 0.9. 12/25/2015 -- he has been approved for grafix and we will order for some to be applied next week 01/01/2016 -- he has had his first application of Grafix today 01/08/2016 -- he has had his second application of Grafix today READMISSION 12/04/2021 This is a now 72 year old man who was followed in our clinic about 5 years ago for an ulceration on his right lateral lower leg, just distal to the knee. He has since undergone a number of revascularization procedures that have been complicated by restenosis and wound infections. During the course of this treatment, he developed a small ulcer on his right anterior tibial surface. Despite use of an Unna boot, the wound has continued to expand. He was referred to the wound care center by Dr. Trula Slade for further evaluation and management. On the  right anterior tibial surface, there is an irregular wound with heavy slough and eschar accumulation. The periwound skin is intact but he does have 1-2+ pitting edema. There is no purulent drainage or malodor. 10/13; second visit for this man who has an ischemic wound in the setting of type 2 diabetes on the right anterior mid tibia area. He has been revascularized. Using [address] Michael [name] ([id number]) [id number] Page  7 of 11 12/22/2021: The wound is about the same size this week. There is a lot of gray sloughy material on the surface, secondary to silver nitrate used to stop bleeding after what sounds like a fairly aggressive debridement last week. 12/30/2021: The wound is smaller this week and a bit cleaner. Edema control is excellent. There is still a bit of slough accumulation. 01/07/2022: The wound continues to contract and is much cleaner this week. Edema control is very good. He has small openings in his upper leg surgical scars, but he is going to be seeing Dr. Trula Slade next Monday and will have him take a look. He has been applying manuka honey to both of the sites. We have been using Santyl and gentamicin on his lower leg wound. 01/14/2022: He saw vascular surgery this week and was told that they were happy to have [place] manage the 2 wounds from his operation. Both of these have a light layer of slough on the surface. The more distal wound is a little bit dry. The anterior tibial wound continues to accumulate a fair amount of slough. Edema control is good. 01/19/2022: Both upper leg wounds are smaller. The more distal is quite dry and cracked when I was examining it. The lower leg wound is more painful and the surrounding tissue is erythematous and indurated. 01/26/2022: The upper leg wounds are healed. The skin at the distal leg wound is quite dry. The lower leg wound is cleaner but still fairly painful. His wrap slid again this week. 02/02/2022: We had  good success keeping his wrap intact using a standard Unna boot. The lower leg wound continues to contract and is clean and less painful this week. 12/11; TheraSkin #1 12/18; TheraSkin #2. Wound looks as though it is contracted. Patient states that his pain was better in the leg. Secondary dressing and kerlix Coban 02/24/2022: The wound is smaller since I saw it last. It is less painful and there is good granulation tissue, including over the exposed tendon. 03/09/2022: The wound continues to contract. There is continued fill of the wound cavity with granulation tissue. 03/16/2022: The wound is smaller again today. There is good granulation tissue forming, covering the tendon. 03/30/2022: The wound continues to contract and fill with good granulation tissue. The granulation tissue is oozing a little bit at the lateral edge. 04/13/2022: We left his TheraSkin on an extra week because it was looking so good. The wound is smaller this week and flush with the surrounding skin. His medial thigh wound has opened up again. There is slough and eschar present. 04/20/2022: The anterior tibial wound continues to contract. There is good granulation tissue with just a light layer of slough. The medial thigh ulcer is superficial with slough present. It is a bit fibrotic due to being in a scar. 04/27/2022: The anterior tibial wound continues to contract. There is still some slough buildup with good granulation tissue. The medial thigh ulcer is very leathery and dry. 05/04/2022: The anterior tibial wound is even smaller today. There is no visible tendon at all. He has some slough and eschar accumulation. The medial thigh ulcer continues to be quite dry with a leathery surface. Patient History Information obtained from Patient. Family History Cancer - Siblings, Heart Disease - Father, Hypertension - Father,Mother, No family history of Hereditary Spherocytosis, Kidney Disease, Seizures, Stroke, Thyroid Problems,  Tuberculosis. Social History Former smoker - smokeless tobacco, Marital Status - Married, Alcohol Use - Rarely - WINE, Drug Use - No History,  Caffeine Use - Daily - COFFEE. Medical History Ear/Nose/Mouth/Throat Patient has history of Chronic sinus problems/congestion - seasonal allergies Cardiovascular Patient has history of Hypertension, Peripheral Arterial Disease - STENTS, Peripheral Venous Disease Endocrine Patient has history of Type II Diabetes - last A1c- 8.2 Hospitalization/Surgery History - left shoulder surgery. Medical A Surgical History Notes nd Constitutional Symptoms (General Health) obesity , h/o (R) leg claudication (right fem stent September 2012) Cardiovascular hyperlipidemia Endocrine pt. on diet intentionally losing 20 pounds since December 2016 in an effort to improve A1C levels Integumentary (Skin) psoriatic arthritis Musculoskeletal bilat knee pain identified as an ortho issue psoriatic arthritis Oncologic Prostate cancer 2018 Psychiatric anxiety Michael [name] ([id number]) [id number] Page 8 of 11 Objective Constitutional no acute distress. Vitals Time Taken: 9:19 AM, Height: 72 in, Weight: 208 lbs, BMI: 28.2, Temperature: 97.6 F, Pulse: 88 bpm, Respiratory Rate: 20 breaths/min, Blood Pressure: 109/67 mmHg, Capillary Blood Glucose: 91 mg/dl. Respiratory Normal work of breathing on room air. General Notes: 05/04/2022: The anterior tibial wound is even smaller today. There is no visible tendon at all. He has some slough and eschar accumulation. The medial thigh ulcer continues to be quite dry with a leathery surface. Integumentary (Hair, Skin) Wound #3 status is Open. Original cause of wound was Blister. The date acquired was: 09/30/2021. The wound has been in treatment 21 weeks. The wound is located on the Right,Anterior Lower Leg. The wound measures 1.8cm length x 1.5cm width x 0.1cm depth; 2.121cm^2 area and 0.212cm^3 volume.  There is Fat Layer (Subcutaneous Tissue) exposed. There is no tunneling or undermining noted. There is a medium amount of serosanguineous drainage noted. The wound margin is distinct with the outline attached to the wound base. There is large (67-100%) pink granulation within the wound bed. There is a small (1-33%) amount of necrotic tissue within the wound bed including Adherent Slough. The periwound skin appearance exhibited: Scarring, Dry/Scaly, Rubor. Periwound temperature was noted as No Abnormality. Wound #4 status is Open. Original cause of wound was Gradually Appeared. The date acquired was: 01/05/2022. The wound has been in treatment 16 weeks. The wound is located on the Right,Medial Upper Leg. The wound measures 2cm length x 1cm width x 0.1cm depth; 1.571cm^2 area and 0.157cm^3 volume. There is Fat Layer (Subcutaneous Tissue) exposed. There is no tunneling or undermining noted. There is a medium amount of serosanguineous drainage noted. The wound margin is distinct with the outline attached to the wound base. There is small (1-33%) red, pink granulation within the wound bed. There is a large (67- 100%) amount of necrotic tissue within the wound bed including Eschar and Adherent Slough. The periwound skin appearance had no abnormalities noted for moisture. The periwound skin appearance had no abnormalities noted for color. The periwound skin appearance exhibited: Scarring. Periwound temperature was noted as No Abnormality. 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 There was a Selective/Open Wound Non-Viable Tissue Debridement with a total area of 2.7 sq cm performed by Michael Maudlin, MD. With the following instrument(s): Curette Material removed includes  Eschar and Slough and after achieving pain control using Lidocaine 4% Topical Solution. No specimens were taken. A time out was conducted at 09:45, prior to the start of the procedure. A Minimum amount of bleeding was controlled with Pressure. The procedure was tolerated well with a pain level of 0 throughout and a pain level of 0 following the procedure. Post Debridement Measurements: 1.8cm length x 1.5cm width x 0.1cm depth; 0.212cm^3 volume. Character of Wound/Ulcer Post Debridement is improved. Severity of Tissue Post Debridement is: Fat layer exposed. Post procedure Diagnosis Wound #3: Same as Pre-Procedure General Notes: Scribed for Dr. Celine Ahr by Michael. Wound #4 Pre-procedure diagnosis of Wound #4 is a Dehisced Wound located on the Right,Medial Upper Leg . There was a Selective/Open Wound Non-Viable Tissue Debridement with a total area of 2 sq cm performed by Michael Maudlin, MD. With the following instrument(s): Curette to remove Non-Viable tissue/material. Material removed includes Eschar after achieving pain control using Lidocaine 4% Topical Solution. No specimens were taken. A time out was conducted at 09:45, prior to the start of the procedure. A Minimum amount of bleeding was controlled with Pressure. The procedure was tolerated well with a pain level of 0 throughout and a pain level of 0 following the procedure. Post Debridement Measurements: 2cm length x 1cm width x 0.1cm depth; 0.157cm^3 volume. Character of Wound/Ulcer Post Debridement is improved. Post procedure Diagnosis Wound #4: Same as Pre-Procedure General Notes: Scribed for Dr. Celine Ahr by Michael. Plan Follow-up  Appointments: Return Appointment in 1 week. - Dr. Celine Ahr - Room 3 Other: - Doctor's note to return back to work (05/04/22) Anesthetic: Michael [name] ([id number]) [id number] Page 9 of 11 Wound #3 Right,Anterior Lower Leg: (In clinic) Topical Lidocaine 4% applied to wound bed Edema Control - Lymphedema / SCD / Other: Avoid standing for long periods of time. WOUND #3: - Lower Leg Wound Laterality: Right, Anterior Cleanser: Soap and Water 1 x Per Week/30 Days Discharge Instructions: May shower and wash wound with dial antibacterial soap and water prior to dressing change. Cleanser: Wound Cleanser 1 x Per Week/30 Days Discharge Instructions: Cleanse the wound with wound cleanser prior to applying a clean dressing using gauze sponges, not tissue or cotton balls. Peri-Wound Care: Sween Lotion (Moisturizing lotion) 1 x Per Week/30 Days Discharge Instructions: Apply moisturizing lotion as directed Prim Dressing: Endoform 2x2 in 1 x Per Week/30 Days ary Discharge Instructions: Moisten with saline Secondary Dressing: ABD Pad, 8x10 1 x Per Week/30 Days Discharge Instructions: Apply over primary dressing as directed. Secondary Dressing: Woven Gauze Sponge, Non-Sterile 4x4 in 1 x Per Week/30 Days Discharge Instructions: Apply over primary dressing as directed. Secured With: [employer] Size 5, 10 (yds) 1 x Per Week/30 Days Com pression Wrap: Kerlix Roll 4.5x3.1 (in/yd) 1 x Per Week/30 Days Discharge Instructions: Apply Kerlix and Coban compression as directed. Com pression Wrap: Coban Self-Adherent Wrap 4x5 (in/yd) 1 x Per Week/30 Days Discharge Instructions: Apply over Kerlix as directed. Com pression Wrap: Unnaboot w/Calamine, 4x10 (in/yd) 1 x Per Week/30 Days Discharge Instructions: Apply Unnaboot to top of dressing and around ankles to prevent dressing from sliding WOUND #4: - Upper Leg Wound Laterality: Right, Medial Cleanser: Soap and Water 1 x Per Week/30  Days Discharge Instructions: May shower and wash wound with dial antibacterial soap and water prior to dressing change. Cleanser: Wound Cleanser 1 x Per Week/30 Days Discharge Instructions: Cleanse the wound with wound cleanser prior to applying a clean dressing using gauze sponges, not tissue or cotton  balls. Prim Dressing: Promogran Prisma Matrix, 4.34 (sq in) (silver collagen) 1 x Per Week/30 Days ary Discharge Instructions: Moisten collagen with hydrogel Secondary Dressing: Zetuvit Plus Silicone Border Dressing 4x4 (in/in) 1 x Per Week/30 Days Discharge Instructions: Apply silicone border over primary dressing as directed. 05/04/2022: The anterior tibial wound is even smaller today. There is no visible tendon at all. He has some slough and eschar accumulation. The medial thigh ulcer continues to be quite dry with a leathery surface. I used a curette to debride slough and eschar from the anterior tibial wound and the leathery eschar off of the medial thigh ulcer. We will continue endoform to the anterior tibia with Kerlix and Coban wrap. Continue Prisma with hydrogel to the medial thigh ulcer. I have asked him to apply hydrogel twice a day to try and keep this moist. He will follow-up in 1 week. Electronic Signature(s) Signed: 05/04/2022 10:53:04 AM By: Michael Maudlin MD FACS Entered By: Michael [name] on 05/04/2022 10:53:04 -------------------------------------------------------------------------------- HxROS Details Patient Name: Date of Service: Michael [name], Michael LTER C. 05/04/2022 9:15 A M Medical Record Number: [id number] Patient Account Number: [id number] Date of Birth/Sex: Treating RN: 1950/08/06 (72 y.o. M) Primary Care Provider: Karren [name] Other Clinician: Referring Provider: Treating Provider/Extender: Michael [name] in Treatment: 21 Information Obtained From Patient Constitutional Symptoms (General Health) Medical History: Past Medical History  Notes: obesity , h/o (R) leg claudication (right fem stent September 2012) Ear/Nose/Mouth/Throat Medical History: Positive for: Chronic sinus problems/congestion - seasonal allergies Cardiovascular Medical History: Positive for: Hypertension; Peripheral Arterial Disease - STENTS; Peripheral Venous Disease Past Medical History Notes: hyperlipidemia Michael [name] ([id number]) [phone].pdf Page 10 of 11 Endocrine Medical History: Positive for: Type II Diabetes - last A1c- 8.2 Past Medical History Notes: pt. on diet intentionally losing 20 pounds since December 2016 in an effort to improve A1C levels Time with diabetes: 9 YRS Treated with: Insulin Blood sugar tested every day: Yes Tested : 7-8 TIMES Integumentary (Skin) Medical History: Past Medical History Notes: psoriatic arthritis Musculoskeletal Medical History: Past Medical History Notes: bilat knee pain identified as an ortho issue psoriatic arthritis Oncologic Medical History: Past Medical History Notes: Prostate cancer 2018 Psychiatric Medical History: Past Medical History Notes: anxiety HBO Extended History Items Ear/Nose/Mouth/Throat: Chronic sinus problems/congestion Immunizations Pneumococcal Vaccine: Received Pneumococcal Vaccination: Yes Received Pneumococcal Vaccination On or After 60th Birthday: Yes Implantable Devices None Hospitalization / Surgery History Type of Hospitalization/Surgery left shoulder surgery Family and Social History Cancer: Yes - Siblings; Heart Disease: Yes - Father; Hereditary Spherocytosis: No; Hypertension: Yes - Father,Mother; Kidney Disease: No; Seizures: No; Stroke: No; Thyroid Problems: No; Tuberculosis: No; Former smoker - smokeless tobacco; Marital Status - Married; Alcohol Use: Rarely - WINE; Drug Use: No History; Caffeine Use: Daily - COFFEE; Financial Concerns: No; Food, Clothing or Shelter Needs: No; Support System Lacking: No;  Transportation Concerns: No Electronic Signature(s) Signed: 05/04/2022 12:33:57 PM By: Michael Maudlin MD FACS Entered By: Michael [name] on 05/04/2022 10:49:18 -------------------------------------------------------------------------------- SuperBill Details Patient Name: Date of Service: Michael [name], Michael LTER C. 05/04/2022 Medical Record Number: [id number] Patient Account Number: [id number] Date of Birth/Sex: Treating RN: Jun 28, 1950 (72 y.o. 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 I70.239 Atherosclerosis of native arteries  of right leg with ulceration of unspecified site E11.622 Type 2 diabetes mellitus with other skin ulcer I73.9 Peripheral vascular disease, unspecified L97.122 Non-pressure chronic ulcer of left thigh with fat layer exposed Facility Procedures : CPT4 Code Description: [id number] - DEBRIDE WOUND 1ST 20 SQ CM OR < ICD-10 Diagnosis Description L97.815 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 involvemen L97.122 Non-pressure chronic ulcer of left thigh with fat  layer exposed Modifier: t without evidence Quantity: 1 of necrosis Physician Procedures : CPT4 Code Description Modifier [id number] - WC PHYS LEVEL 3 - EST PT 25 ICD-10 Diagnosis Description L97.815 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 without evidence L97.122 Non-pressure chronic ulcer of  left thigh with fat layer exposed I70.239 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

## 2022-05-06 NOTE — Progress Notes (Signed)
WILBORN, DEETER ([id number]) [id number] Page 1 of 8 Visit Report for 05/04/2022 Arrival Information Details Patient Name: Date of Service: HO FFMA [place], [name] 05/04/2022 9:15 A M Medical Record Number: [id number] Patient Account Number: [id number] Date of Birth/Sex: Treating RN: 15-Jun-1950 (72 y.o. M) Primary Care Raiven Belizaire: [name] Other Clinician: Referring Jalayne Ganesh: Treating Mael Delap/Extender: [name] in Treatment: 21 Visit Information History Since Last Visit All ordered tests and consults were completed: No Patient Arrived: Ambulatory Added or deleted any medications: No Arrival Time: 09:18 Any new allergies or adverse reactions: No Accompanied By: wife Had a fall or experienced change in No Transfer Assistance: None activities of daily living that may affect Patient Identification Verified: Yes risk of falls: Secondary Verification Process Completed: Yes Signs or symptoms of abuse/neglect since last visito No Patient Requires Transmission-Based Precautions: No Hospitalized since last visit: No Patient Has Alerts: Yes Implantable device outside of the clinic excluding No Patient Alerts: Patient on Blood Thinner cellular tissue based products placed in the center plavix since last visit: Pain Present Now: No Electronic Signature(s) Signed: 05/04/2022 10:17:17 AM By: Worthy Rancher Entered By: Worthy Rancher on 05/04/2022 09:19:09 -------------------------------------------------------------------------------- Encounter Discharge Information Details Patient Name: Date of Service: [name], [place]. 05/04/2022 9:15 A M Medical Record Number: [id number] Patient Account Number: [id number] Date of Birth/Sex: Treating RN: 1950-11-03 (72 y.o. [name] Primary Care Kaydn Kumpf: [name] Other Clinician: Referring Manju Kulkarni: Treating Shavonna Corella/Extender: [name] in Treatment:  21 Encounter Discharge Information Items Post Procedure Vitals Discharge Condition: Stable Temperature (F): 97.6 Ambulatory Status: Cane Pulse (bpm): 88 Discharge Destination: Home Respiratory Rate (breaths/min): 20 Transportation: Private Auto Blood Pressure (mmHg): 109/67 Accompanied By: self Schedule Follow-up Appointment: Yes Clinical Summary of Care: Patient Declined Electronic Signature(s) Signed: 05/04/2022 6:24:12 PM By: Dellie Catholic RN Entered By: Dellie Catholic on 05/04/2022 18:11:10 -------------------------------------------------------------------------------- Lower Extremity Assessment Details Patient Name: Date of Service: [name], [name] 05/04/2022 9:15 A M Medical Record Number: [id number] Patient Account Number: [id number] Date of Birth/Sex: Treating RN: 11/24/1950 (72 y.o. [name] Primary Care Sojourner Behringer: [name] Other Clinician: Referring Alaric Gladwin: Treating Naeemah Jasmer/Extender: [name] in Treatment: 21 Edema Assessment Assessed: [Left: No] [Right: No] H[LeftTYMON, DETZLER C ([id number] [[id number] Page 2 of 8] Edema: [Left: Ye] [Right: s] Calf Left: Right: Point of Measurement: 35 cm From Medial Instep 34.4 cm Ankle Left: Right: Point of Measurement: 12 cm From Medial Instep 21 cm Vascular Assessment Pulses: Dorsalis Pedis Palpable: [Right:Yes] Electronic Signature(s) Signed: 05/04/2022 6:24:12 PM By: Dellie Catholic RN Entered By: Dellie Catholic on 05/04/2022 09:44:30 -------------------------------------------------------------------------------- Multi Wound Chart Details Patient Name: Date of Service: [name], [name] 05/04/2022 9:15 A M Medical Record Number: [id number] Patient Account Number: [id number] Date of Birth/Sex: Treating RN: 1950-08-31 (72 y.o. M) Primary Care Kayn Haymore: [name] Other Clinician: Referring Nanami Whitelaw: Treating  Arthur Speagle/Extender: [name] in Treatment: 21 Vital Signs Height(in): 69 Capillary Blood Glucose(mg/dl): 91 Weight(lbs): 208 Pulse(bpm): 33 Body Mass Index(BMI): 28.2 Blood Pressure(mmHg): 109/67 Temperature(F): 97.6 Respiratory Rate(breaths/min): 20 [3:Photos:] [N/A:N/A] Right, Anterior Lower Leg Right, Medial Upper Leg N/A Wound Location: Blister Gradually Appeared N/A Wounding Event: Diabetic Wound/Ulcer of the Lower Dehisced Wound N/A Primary Etiology: Extremity Chronic sinus problems/congestion, Chronic sinus problems/congestion, N/A Comorbid History: Hypertension, Peripheral Arterial Hypertension, Peripheral Arterial Disease, Peripheral Venous Disease, Disease, Peripheral Venous Disease, Type II Diabetes Type II Diabetes 09/30/2021 01/05/2022 N/A Date Acquired:  21 16 N/A Weeks of Treatment: Open Open N/A Wound Status: No No N/A Wound Recurrence: 1.8x1.5x0.1 2x1x0.1 N/A Measurements L x W x D (cm) 2.121 1.571 N/A A (cm) : rea 0.212 0.157 N/A Volume (cm) : 87.90% N/A N/A % Reduction in Area: 96.00% N/A N/A % Reduction in Volume: Grade 2 Full Thickness Without Exposed N/A Classification: Support Structures Medium Medium N/A Exudate Amount: Serosanguineous Serosanguineous N/A Exudate Type: red, brown red, brown N/A Exudate Color: Distinct, outline attached Distinct, outline attached N/A Wound Margin: Large (67-100%) Small (1-33%) N/A Granulation Amount: Pink Red, Pink N/A Granulation QualityHEATHER, ZERON ([id number]) [phone].pdf Page 3 of 8 Small (1-33%) Large (67-100%) N/A Necrotic Amount: Adherent Slough Eschar, Adherent Slough N/A Necrotic Tissue: Fat Layer (Subcutaneous Tissue): Yes Fat Layer (Subcutaneous Tissue): Yes N/A Exposed Structures: Fascia: No Fascia: No Tendon: No Tendon: No Muscle: No Muscle: No Joint: No Joint: No Bone: No Bone: No Small (1-33%) Small (1-33%)  N/A Epithelialization: Debridement - Selective/Open Wound Debridement - Selective/Open Wound N/A Debridement: Pre-procedure Verification/Time Out 09:45 09:45 N/A Taken: Lidocaine 4% Topical Solution Lidocaine 4% Topical Solution N/A Pain Control: Necrotic/Eschar, Slough Necrotic/Eschar N/A Tissue Debrided: Non-Viable Tissue Non-Viable Tissue N/A Level: 2.7 2 N/A Debridement A (sq cm): rea Curette Curette N/A Instrument: Minimum Minimum N/A Bleeding: Pressure Pressure N/A Hemostasis A chieved: 0 0 N/A Procedural Pain: 0 0 N/A Post Procedural Pain: Procedure was tolerated well Procedure was tolerated well N/A Debridement Treatment Response: 1.8x1.5x0.1 2x1x0.1 N/A Post Debridement Measurements L x W x D (cm) 0.212 0.157 N/A Post Debridement Volume: (cm) Scarring: Yes Scarring: Yes N/A Periwound Skin Texture: Dry/Scaly: Yes No Abnormalities Noted N/A Periwound Skin Moisture: Rubor: Yes No Abnormalities Noted N/A Periwound Skin Color: No Abnormality No Abnormality N/A Temperature: Debridement Debridement N/A Procedures Performed: Treatment Notes Electronic Signature(s) Signed: 05/04/2022 10:48:22 AM By: [name] Entered By: Fredirick Maudlin on 05/04/2022 10:48:22 -------------------------------------------------------------------------------- Multi-Disciplinary Care Plan Details Patient Name: Date of Service: [name], [name] 05/04/2022 9:15 A M Medical Record Number: [id number] Patient Account Number: [id number] Date of Birth/Sex: Treating RN: [birth year] (72 y.o. [name] Primary Care Shabria Egley: [name] Other Clinician: Referring Lorenza Winkleman: Treating Derrill Bagnell/Extender: [name] in Treatment: 21 Active Inactive Nutrition Nursing Diagnoses: Potential for alteratiion in Nutrition/Potential for imbalanced nutrition Goals: Patient/caregiver agrees to and verbalizes understanding of need to use  nutritional supplements and/or vitamins as prescribed Date Initiated: 12/04/2021 Target Resolution Date: 05/29/2022 Goal Status: Active Patient/caregiver verbalizes understanding of need to maintain therapeutic glucose control per primary care physician Date Initiated: 12/04/2021 Target Resolution Date: 05/29/2022 Goal Status: Active Interventions: Assess patient nutrition upon admission and as needed per policy Provide education on elevated blood sugars and impact on wound healing Provide education on nutrition Treatment Activities: Education provided on Nutrition : 01/26/2022 Notes: Wound/Skin Impairment ACEL, EASTER ([id number]) [phone].pdf Page 4 of 8 Nursing Diagnoses: Impaired tissue integrity Knowledge deficit related to ulceration/compromised skin integrity Goals: Patient/caregiver will verbalize understanding of skin care regimen Date Initiated: 12/04/2021 Date Inactivated: 02/02/2022 Target Resolution Date: 01/28/2022 Goal Status: Met Ulcer/skin breakdown will have a volume reduction of 30% by week 4 Date Initiated: 12/04/2021 Date Inactivated: 01/07/2022 Target Resolution Date: 12/26/2021 Goal Status: Met Ulcer/skin breakdown will have a volume reduction of 50% by week 8 Date Initiated: 01/07/2022 Date Inactivated: 03/16/2022 Target Resolution Date: 04/01/2022 Goal Status: Met Ulcer/skin breakdown will have a volume reduction of 80% by week 12 Date Initiated: 03/16/2022 Target Resolution Date: 05/29/2022 Goal  Status: Active Interventions: Assess ulceration(s) every visit Provide education on ulcer and skin care Notes: Electronic Signature(s) Signed: 05/04/2022 6:24:12 PM By: Dellie Catholic RN Entered By: Dellie Catholic on 05/04/2022 18:08:00 -------------------------------------------------------------------------------- Pain Assessment Details Patient Name: Date of Service: [name], [name] 05/04/2022 9:15 A M Medical Record Number:  [id number] Patient Account Number: [id number] Date of Birth/Sex: Treating RN: 1950-03-10 (72 y.o. M) Primary Care Alejah Aristizabal: [name] Other Clinician: Referring Gaetano Romberger: Treating Jefte Carithers/Extender: [name] in Treatment: 21 Active Problems Location of Pain Severity and Description of Pain Patient Has Paino No Site Locations Pain Management and Medication Current Pain Management: Electronic Signature(s) Signed: 05/04/2022 10:17:17 AM By: Worthy Rancher Entered By: Worthy Rancher on 05/04/2022 09:19:43 Schroth, Marjory Lies ([id number]) [id number] Page 5 of 8 -------------------------------------------------------------------------------- Patient/Caregiver Education Details Patient Name: Date of Service: HO FFMA [place], [name] 3/4/2024andnbsp9:15 [place] Record Number: [id number] Patient Account Number: [id number] Date of Birth/Gender: Treating RN: June 29, 1950 (72 y.o. [name] Primary Care Physician: [name] Other Clinician: Referring Physician: Treating Physician/Extender: [name] in Treatment: 21 Education Assessment Education Provided To: Patient Education Topics Provided Wound/Skin Impairment: Methods: Explain/Verbal Responses: Return demonstration correctly Electronic Signature(s) Signed: 05/04/2022 6:24:12 PM By: Dellie Catholic RN Entered By: Dellie Catholic on 05/04/2022 18:08:14 -------------------------------------------------------------------------------- Wound Assessment Details Patient Name: Date of Service: [name], [name] 05/04/2022 9:15 A M Medical Record Number: [id number] Patient Account Number: [id number] Date of Birth/Sex: Treating RN: [birth year] (72 y.o. M) Primary Care Joakim Huesman: [name] Other Clinician: Referring Castiel Lauricella: Treating Syna Gad/Extender: [name] in Treatment: 21 Wound Status Wound Number: 3 Primary  Diabetic Wound/Ulcer of the Lower Extremity Etiology: Wound Location: Right, Anterior Lower Leg Wound Open Wounding Event: Blister Status: Date Acquired: 09/30/2021 Comorbid Chronic sinus problems/congestion, Hypertension, Peripheral Weeks Of Treatment: 21 History: Arterial Disease, Peripheral Venous Disease, Type II Diabetes Clustered Wound: No Photos Wound Measurements Length: (cm) 1.8 Width: (cm) 1.5 Depth: (cm) 0.1 Area: (cm) 2.121 Volume: (cm) 0.212 % Reduction in Area: 87.9% % Reduction in Volume: 96% Epithelialization: Small (1-33%) Tunneling: No Undermining: No Wound Description Classification: Grade 2 Wound Margin: Distinct, outline attached Exudate Amount: Medium Exudate Type: Serosanguineous Exudate Color: red, brown Waid, Montarius C ([id number]) Foul Odor After Cleansing: No Slough/Fibrino Yes [phone].pdf Page 6 of 8 Wound Bed Granulation Amount: Large (67-100%) Exposed Structure Granulation Quality: Pink Fascia Exposed: No Necrotic Amount: Small (1-33%) Fat Layer (Subcutaneous Tissue) Exposed: Yes Necrotic Quality: Adherent Slough Tendon Exposed: No Muscle Exposed: No Joint Exposed: No Bone Exposed: No Periwound Skin Texture Texture Color No Abnormalities Noted: No No Abnormalities Noted: No Scarring: Yes Rubor: Yes Moisture Temperature / Pain No Abnormalities Noted: No Temperature: No Abnormality Dry / Scaly: Yes Treatment Notes Wound #3 (Lower Leg) Wound Laterality: Right, Anterior Cleanser Soap and Water Discharge Instruction: May shower and wash wound with dial antibacterial soap and water prior to dressing change. Wound Cleanser Discharge Instruction: Cleanse the wound with wound cleanser prior to applying a clean dressing using gauze sponges, not tissue or cotton balls. Peri-Wound Care Sween Lotion (Moisturizing lotion) Discharge Instruction: Apply moisturizing lotion as directed Topical Primary  Dressing Endoform 2x2 in Discharge Instruction: Moisten with saline Secondary Dressing ABD Pad, 8x10 Discharge Instruction: Apply over primary dressing as directed. Woven Gauze Sponge, Non-Sterile 4x4 in Discharge Instruction: Apply over primary dressing as directed. Secured With [employer] Size 5, 10 (yds) Compression Wrap Kerlix Roll 4.5x3.1 (in/yd) Discharge Instruction: Apply Kerlix  and Coban compression as directed. Coban Self-Adherent Wrap 4x5 (in/yd) Discharge Instruction: Apply over Kerlix as directed. Unnaboot w/Calamine, 4x10 (in/yd) Discharge Instruction: Apply Unnaboot to top of dressing and around ankles to prevent dressing from sliding Compression Stockings Add-Ons Electronic Signature(s) Signed: 05/04/2022 6:24:12 PM By: Dellie Catholic RN Entered By: Dellie Catholic on 05/04/2022 09:47:03 -------------------------------------------------------------------------------- Wound Assessment Details Patient Name: Date of Service: [name], [name] 05/04/2022 9:15 A M Medical Record Number: [id number] Patient Account Number: [id number] Date of Birth/Sex: Treating RN: [birth year] (72 y.o. M) Primary Care Amyiah Gaba: [name] Other Clinician: Referring Rether Rison: Treating Jere Vanburen/Extender: [name] in Treatment: [address], [place] C ([id number]) [id number] Page 7 of 8 Wound Status Wound Number: 4 Primary Dehisced Wound Etiology: Wound Location: Right, Medial Upper Leg Wound Open Wounding Event: Gradually Appeared Status: Date Acquired: 01/05/2022 Comorbid Chronic sinus problems/congestion, Hypertension, Peripheral Weeks Of Treatment: 16 History: Arterial Disease, Peripheral Venous Disease, Type II Diabetes Clustered Wound: No Photos Wound Measurements Length: (cm) 2 Width: (cm) 1 Depth: (cm) 0.1 Area: (cm) 1.571 Volume: (cm) 0.157 % Reduction in Area: % Reduction in Volume: Epithelialization: Small  (1-33%) Tunneling: No Undermining: No Wound Description Classification: Full Thickness Without Exposed Support Structures Wound Margin: Distinct, outline attached Exudate Amount: Medium Exudate Type: Serosanguineous Exudate Color: red, brown Foul Odor After Cleansing: No Slough/Fibrino Yes Wound Bed Granulation Amount: Small (1-33%) Exposed Structure Granulation Quality: Red, Pink Fascia Exposed: No Necrotic Amount: Large (67-100%) Fat Layer (Subcutaneous Tissue) Exposed: Yes Necrotic Quality: Eschar, Adherent Slough Tendon Exposed: No Muscle Exposed: No Joint Exposed: No Bone Exposed: No Periwound Skin Texture Texture Color No Abnormalities Noted: No No Abnormalities Noted: Yes Scarring: Yes Temperature / Pain Temperature: No Abnormality Moisture No Abnormalities Noted: Yes Treatment Notes Wound #4 (Upper Leg) Wound Laterality: Right, Medial Cleanser Soap and Water Discharge Instruction: May shower and wash wound with dial antibacterial soap and water prior to dressing change. Wound Cleanser Discharge Instruction: Cleanse the wound with wound cleanser prior to applying a clean dressing using gauze sponges, not tissue or cotton balls. Peri-Wound Care Topical Primary Dressing Promogran Prisma Matrix, 4.34 (sq in) (silver collagen) Discharge Instruction: Moisten collagen with hydrogel Secondary Dressing Zetuvit Plus Silicone Border Dressing 4x4 (in/in) VALERIA, BENEZRA ([id number]) [id number] Page 8 of 8 Discharge Instruction: Apply silicone border over primary dressing as directed. Secured With Compression Wrap Compression Stockings Environmental education officer) Signed: 05/04/2022 6:24:12 PM By: Dellie Catholic RN Entered By: Dellie Catholic on 05/04/2022 09:45:47 -------------------------------------------------------------------------------- Vitals Details Patient Name: Date of Service: [name], [name] 05/04/2022 9:15 A  M Medical Record Number: [id number] Patient Account Number: [id number] Date of Birth/Sex: Treating RN: 07-Sep-1950 (72 y.o. M) Primary Care Abigale Dorow: [name] Other Clinician: Referring Lunetta Marina: Treating Briunna Leicht/Extender: [name] in Treatment: 21 Vital Signs Time Taken: 09:19 Temperature (F): 97.6 Height (in): 72 Pulse (bpm): 88 Weight (lbs): 208 Respiratory Rate (breaths/min): 20 Body Mass Index (BMI): 28.2 Blood Pressure (mmHg): 109/67 Capillary Blood Glucose (mg/dl): 91 Reference Range: 80 - 120 mg / dl Electronic Signature(s) Signed: 05/04/2022 10:17:17 AM By: Worthy Rancher Entered By: Worthy Rancher on 05/04/2022 09:19:37

## 2022-05-11 ENCOUNTER — Encounter (HOSPITAL_BASED_OUTPATIENT_CLINIC_OR_DEPARTMENT_OTHER): Payer: Medicare Other | Admitting: General Surgery

## 2022-05-11 DIAGNOSIS — I70239 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: Secondary | ICD-10-CM | POA: Diagnosis not present

## 2022-05-11 DIAGNOSIS — N189 Chronic kidney disease, unspecified: Secondary | ICD-10-CM | POA: Diagnosis not present

## 2022-05-11 DIAGNOSIS — I129 Hypertensive chronic kidney disease with stage 1 through stage 4 chronic kidney disease, or unspecified chronic kidney disease: Secondary | ICD-10-CM | POA: Diagnosis not present

## 2022-05-11 DIAGNOSIS — Z6828 Body mass index (BMI) 28.0-28.9, adult: Secondary | ICD-10-CM | POA: Diagnosis not present

## 2022-05-11 DIAGNOSIS — E1151 Type 2 diabetes mellitus with diabetic peripheral angiopathy without gangrene: Secondary | ICD-10-CM | POA: Diagnosis not present

## 2022-05-11 DIAGNOSIS — E11622 Type 2 diabetes mellitus with other skin ulcer: Secondary | ICD-10-CM | POA: Diagnosis not present

## 2022-05-11 DIAGNOSIS — L97122 Non-pressure chronic ulcer of left thigh with fat layer exposed: Secondary | ICD-10-CM | POA: Diagnosis not present

## 2022-05-11 DIAGNOSIS — E1122 Type 2 diabetes mellitus with diabetic chronic kidney disease: Secondary | ICD-10-CM | POA: Diagnosis not present

## 2022-05-11 DIAGNOSIS — L97812 Non-pressure chronic ulcer of other part of right lower leg with fat layer exposed: Secondary | ICD-10-CM | POA: Diagnosis not present

## 2022-05-11 DIAGNOSIS — L97815 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: Secondary | ICD-10-CM | POA: Diagnosis not present

## 2022-05-11 DIAGNOSIS — E669 Obesity, unspecified: Secondary | ICD-10-CM | POA: Diagnosis not present

## 2022-05-11 DIAGNOSIS — E1165 Type 2 diabetes mellitus with hyperglycemia: Secondary | ICD-10-CM | POA: Diagnosis not present

## 2022-05-11 DIAGNOSIS — T8131XA Disruption of external operation (surgical) wound, not elsewhere classified, initial encounter: Secondary | ICD-10-CM | POA: Diagnosis not present

## 2022-05-12 NOTE — Progress Notes (Signed)
Michael [name], Michael [name] ([id number]) [id number] Page 1 of 9 Visit Report for 05/11/2022 Arrival Information Details Patient Name: Date of Service: HO FFMA [place], [name] 05/11/2022 8:30 A M Medical Record Number: [id number] Patient Account Number: [id number] Date of Birth/Sex: Treating RN: June 03, 1950 (72 y.o. Michael [name] Session Primary Care Jonaya Freshour: [name] Other Clinician: Referring Jeret Goyer: Treating Rayansh Herbst/Extender: [name] in Treatment: 48 Visit Information History Since Last Visit Added or deleted any medications: No Patient Arrived: Ambulatory Any new allergies or adverse reactions: No Arrival Time: 08:15 Had a fall or experienced change in No Accompanied By: wife activities of daily living that may affect Transfer Assistance: None risk of falls: Patient Identification Verified: Yes Signs or symptoms of abuse/neglect since last visito No Secondary Verification Process Completed: Yes Hospitalized since last visit: No Patient Requires Transmission-Based Precautions: No Implantable device outside of the clinic excluding No Patient Has Alerts: Yes cellular tissue based products placed in the center Patient Alerts: Patient on Blood Thinner since last visit: plavix Has Compression in Place as Prescribed: Yes Pain Present Now: Yes Electronic Signature(s) Signed: 05/11/2022 4:41:37 PM By: [name] Previous Signature: 05/11/2022 8:32:49 AM Version By: [name] Entered By: Blanche East on 05/11/2022 08:41:53 -------------------------------------------------------------------------------- Encounter Discharge Information Details Patient Name: Date of Service: [name], [name] 05/11/2022 8:30 A M Medical Record Number: [id number] Patient Account Number: [id number] Date of Birth/Sex: Treating RN: 1950/03/10 (72 y.o. Michael [name] Session Primary Care [name]: [name] Other Clinician: Referring  [name]: Treating Clotine Heiner/Extender: [name] in Treatment: 22 Encounter Discharge Information Items Post Procedure Vitals Discharge Condition: Stable Temperature (F): 97.9 Ambulatory Status: Ambulatory Pulse (bpm): 94 Discharge Destination: Home Respiratory Rate (breaths/min): 18 Transportation: Private Auto Blood Pressure (mmHg): 122/62 Accompanied By: self Schedule Follow-up Appointment: Yes Clinical Summary of Care: Electronic Signature(s) Signed: 05/11/2022 9:06:48 AM By: [name] Entered By: Blanche East on 05/11/2022 09:06:48 -------------------------------------------------------------------------------- Lower Extremity Assessment Details Patient Name: Date of Service: [name], [name] 05/11/2022 8:30 A M Medical Record Number: [id number] Patient Account Number: [id number] Date of Birth/Sex: Treating RN: 1950-12-08 (72 y.o. Michael [name] Session Primary Care Michael [name]: [name] Other Clinician: Referring Michael [name]: Treating Michael [name]/Extender: [name] in Treatment: 22 Edema Assessment H[Left: Quay Burow [id number] Patrice [id number].pdf Page 2 of 9] Assessed: [Left: No] [Right: No] Edema: [Left: Ye] [Right: s] Calf Left: Right: Point of Measurement: 35 cm From Medial Instep 35 cm Ankle Left: Right: Point of Measurement: 12 cm From Medial Instep 21.4 cm Vascular Assessment Pulses: Dorsalis Pedis Palpable: [Right:Yes] Electronic Signature(s) Signed: 05/11/2022 8:33:28 AM By: [name] Entered By: Blanche East on 05/11/2022 08:33:27 -------------------------------------------------------------------------------- Multi Wound Chart Details Patient Name: Date of Service: [name], [name] 05/11/2022 8:30 A M Medical Record Number: [id number] Patient Account Number: [id number] Date of Birth/Sex: Treating RN: [birth year] (72 y.o. M) Primary Care Michael [name]:  [name] Other Clinician: Referring Mcclellan Demarais: Treating Michael [name]/Extender: [name] in Treatment: 22 Vital Signs Height(in): 65 Pulse(bpm): 63 Weight(lbs): 208 Blood Pressure(mmHg): 122/62 Body Mass Index(BMI): 28.2 Temperature(F): 97.9 Respiratory Rate(breaths/min): 18 [3:Photos:] [N/A:N/A] Right, Anterior Lower Leg Right, Medial Upper Leg N/A Wound Location: Blister Gradually Appeared N/A Wounding Event: Diabetic Wound/Ulcer of the Lower Dehisced Wound N/A Primary Etiology: Extremity Chronic sinus problems/congestion, Chronic sinus problems/congestion, N/A Comorbid History: Hypertension, Peripheral Arterial Hypertension, Peripheral Arterial Disease, Peripheral Venous Disease, Disease, Peripheral Venous Disease, Type II Diabetes Type II  Diabetes 09/30/2021 01/05/2022 N/A Date Acquired: 22 17 N/A Weeks of Treatment: Open Open N/A Wound Status: No No N/A Wound Recurrence: 1.8x1.5x0.1 2x1.4x0.1 N/A Measurements L x W x D (cm) 2.121 2.199 N/A A (cm) : rea 0.212 0.22 N/A Volume (cm) : 87.90% N/A N/A % Reduction in Area: 96.00% N/A N/A % Reduction in Volume: Grade 2 Full Thickness Without Exposed N/A Classification: Support Structures Medium Medium N/A Exudate Amount: Serosanguineous Serosanguineous N/A Exudate Type: red, brown red, brown N/A Exudate Color: Distinct, outline attached Distinct, outline attached N/A Wound Margin: Large (67-100%) Small (1-33%) N/A Granulation Amount: [name], [name] ([id number]) [phone].pdf Page 3 of 9 Pink Red, Pink N/A Granulation Quality: Small (1-33%) Large (67-100%) N/A Necrotic Amount: Adherent Slough Eschar, Adherent Slough N/A Necrotic Tissue: Fat Layer (Subcutaneous Tissue): Yes Fat Layer (Subcutaneous Tissue): Yes N/A Exposed Structures: Fascia: No Fascia: No Tendon: No Tendon: No Muscle: No Muscle: No Joint: No Joint: No Bone: No Bone:  No Small (1-33%) Small (1-33%) N/A Epithelialization: Debridement - Selective/Open Wound Debridement - Selective/Open Wound N/A Debridement: Pre-procedure Verification/Time Out 08:43 08:43 N/A Taken: Lidocaine 5% topical ointment Lidocaine 5% topical ointment N/A Pain Control: Necrotic/Eschar, Psychologist, prison and probation services, [employer] N/A Tissue Debrided: Non-Viable Tissue Non-Viable Tissue N/A Level: 2.7 2.8 N/A Debridement A (sq cm): rea Curette Curette N/A Instrument: Minimum Minimum N/A Bleeding: Silver Nitrate Silver Nitrate N/A Hemostasis A chieved: Procedure was tolerated well Procedure was tolerated well N/A Debridement Treatment Response: 1.8x1.5x0.1 2x1.4x0.1 N/A Post Debridement Measurements L x W x D (cm) 0.212 0.22 N/A Post Debridement Volume: (cm) Scarring: Yes Scarring: Yes N/A Periwound Skin Texture: Dry/Scaly: Yes No Abnormalities Noted N/A Periwound Skin Moisture: Rubor: Yes No Abnormalities Noted N/A Periwound Skin Color: No Abnormality No Abnormality N/A Temperature: Debridement Debridement N/A Procedures Performed: Treatment Notes Wound #3 (Lower Leg) Wound Laterality: Right, Anterior Cleanser Soap and Water Discharge Instruction: May shower and wash wound with dial antibacterial soap and water prior to dressing change. Wound Cleanser Discharge Instruction: Cleanse the wound with wound cleanser prior to applying a clean dressing using gauze sponges, not tissue or cotton balls. Peri-Wound Care Sween Lotion (Moisturizing lotion) Discharge Instruction: Apply moisturizing lotion as directed Topical Primary Dressing Endoform 2x2 in Discharge Instruction: Moisten with saline Secondary Dressing ABD Pad, 8x10 Discharge Instruction: Apply over primary dressing as directed. Woven Gauze Sponge, Non-Sterile 4x4 in Discharge Instruction: Apply over primary dressing as directed. Secured With [employer] Size 5, 10 (yds) Compression Wrap Kerlix Roll 4.5x3.1  (in/yd) Discharge Instruction: Apply Kerlix and Coban compression as directed. Coban Self-Adherent Wrap 4x5 (in/yd) Discharge Instruction: Apply over Kerlix as directed. Unnaboot w/Calamine, 4x10 (in/yd) Discharge Instruction: Apply Unnaboot to top of dressing and around ankles to prevent dressing from sliding Compression Stockings Add-Ons Wound #4 (Upper Leg) Wound Laterality: Right, Medial Cleanser Soap and Water Discharge Instruction: May shower and wash wound with dial antibacterial soap and water prior to dressing change. Wound Cleanser ACHYUT, CANNADY ([id number]) [id number] Page 4 of 9 Discharge Instruction: Cleanse the wound with wound cleanser prior to applying a clean dressing using gauze sponges, not tissue or cotton balls. Peri-Wound Care Topical Primary Dressing Promogran Prisma Matrix, 4.34 (sq in) (silver collagen) Discharge Instruction: Moisten collagen with hydrogel Secondary Dressing Zetuvit Plus Silicone Border Dressing 4x4 (in/in) Discharge Instruction: Apply silicone border over primary dressing as directed. Secured With Compression Wrap Compression Stockings Environmental education officer) Signed: 05/11/2022 9:38:48 AM By: [name] Entered By: Fredirick Maudlin on 05/11/2022 09:38:48 -------------------------------------------------------------------------------- Multi-Disciplinary Care Plan Details  Patient Name: Date of Service: [name], [name] 05/11/2022 8:30 A M Medical Record Number: [id number] Patient Account Number: [id number] Date of Birth/Sex: Treating RN: 09-06-1950 (72 y.o. Michael [name] Session Primary Care Tadeusz Stahl: [name] Other Clinician: Referring Falynn Ailey: Treating Sonora Catlin/Extender: [name] in Treatment: 22 Active Inactive Nutrition Nursing Diagnoses: Potential for alteratiion in Nutrition/Potential for imbalanced nutrition Goals: Patient/caregiver agrees  to and verbalizes understanding of need to use nutritional supplements and/or vitamins as prescribed Date Initiated: 12/04/2021 Target Resolution Date: 05/29/2022 Goal Status: Active Patient/caregiver verbalizes understanding of need to maintain therapeutic glucose control per primary care physician Date Initiated: 12/04/2021 Target Resolution Date: 05/29/2022 Goal Status: Active Interventions: Assess patient nutrition upon admission and as needed per policy Provide education on elevated blood sugars and impact on wound healing Provide education on nutrition Treatment Activities: Education provided on Nutrition : 01/19/2022 Notes: Wound/Skin Impairment Nursing Diagnoses: Impaired tissue integrity Knowledge deficit related to ulceration/compromised skin integrity Goals: Patient/caregiver will verbalize understanding of skin care regimen Date Initiated: 12/04/2021 Date Inactivated: 02/02/2022 Target Resolution Date: 01/28/2022 Goal Status: Met Ulcer/skin breakdown will have a volume reduction of 30% by week 4 Pedretti, Ryshawn C ([id number]) [phone].pdf Page 5 of 9 Date Initiated: 12/04/2021 Date Inactivated: 01/07/2022 Target Resolution Date: 12/26/2021 Goal Status: Met Ulcer/skin breakdown will have a volume reduction of 50% by week 8 Date Initiated: 01/07/2022 Date Inactivated: 03/16/2022 Target Resolution Date: 04/01/2022 Goal Status: Met Ulcer/skin breakdown will have a volume reduction of 80% by week 12 Date Initiated: 03/16/2022 Target Resolution Date: 05/29/2022 Goal Status: Active Interventions: Assess ulceration(s) every visit Provide education on ulcer and skin care Notes: Electronic Signature(s) Signed: 05/11/2022 8:34:00 AM By: [name] Entered By: Blanche East on 05/11/2022 08:33:59 -------------------------------------------------------------------------------- Pain Assessment Details Patient Name: Date of Service: [name], [name]  05/11/2022 8:30 A M Medical Record Number: [id number] Patient Account Number: [id number] Date of Birth/Sex: Treating RN: 1951-01-29 (72 y.o. Michael [name] Session Primary Care Cuong Moorman: [name] Other Clinician: Referring Nathifa Ritthaler: Treating Refoel Palladino/Extender: [name] in Treatment: 22 Active Problems Location of Pain Severity and Description of Pain Patient Has Paino Yes Site Locations Pain Location: Pain in Ulcers Rate the pain. Current Pain Level: 4 Pain Management and Medication Current Pain Management: Electronic Signature(s) Signed: 05/11/2022 4:41:37 PM By: [name] Entered By: Blanche East on 05/11/2022 08:33:12 -------------------------------------------------------------------------------- Patient/Caregiver Education Details Patient Name: Date of Service: [name], [place]. 3/11/2024andnbsp8:30 [place] Record Number: [id number] Patient Account Number: [id number] Date of Birth/Gender: Treating RN: 12-11-1950 (72 y.o. Michael [name] Session Primary Care Physician: [name] Other Clinician: Referring Physician: Treating Physician/Extender: [name] in Treatment: [address] [place] C ([id number]) [id number] Page 6 of 9 Education Assessment Education Provided To: Patient Education Topics Provided Wound Debridement: Methods: Explain/Verbal Responses: Reinforcements needed, State content correctly Wound/Skin Impairment: Methods: Explain/Verbal Responses: Reinforcements needed, State content correctly Electronic Signature(s) Signed: 05/11/2022 4:41:37 PM By: [name] Entered By: Blanche East on 05/11/2022 08:42:17 -------------------------------------------------------------------------------- Wound Assessment Details Patient Name: Date of Service: [name], [name] 05/11/2022 8:30 A M Medical Record Number: [id number] Patient Account Number: [id number] Date  of Birth/Sex: Treating RN: [birth year] (72 y.o. Michael [name] Session Primary Care Khamani Fairley: [name] Other Clinician: Referring Anda Sobotta: Treating Charlies Rayburn/Extender: [name] in Treatment: 22 Wound Status Wound Number: 3 Primary Diabetic Wound/Ulcer of the Lower Extremity Etiology: Wound Location: Right, Anterior Lower Leg Wound Open  Wounding Event: Blister Status: Date Acquired: 09/30/2021 Comorbid Chronic sinus problems/congestion, Hypertension, Peripheral Weeks Of Treatment: 22 History: Arterial Disease, Peripheral Venous Disease, Type II Diabetes Clustered Wound: No Photos Wound Measurements Length: (cm) 1.8 Width: (cm) 1.5 Depth: (cm) 0.1 Area: (cm) 2.121 Volume: (cm) 0.212 % Reduction in Area: 87.9% % Reduction in Volume: 96% Epithelialization: Small (1-33%) Tunneling: No Undermining: No Wound Description Classification: Grade 2 Wound Margin: Distinct, outline attached Exudate Amount: Medium Exudate Type: Serosanguineous Exudate Color: red, brown Foul Odor After Cleansing: No Slough/Fibrino Yes Wound Bed Granulation Amount: Large (67-100%) Exposed Structure Granulation Quality: Pink Fascia Exposed: No Necrotic Amount: Small (1-33%) Fat Layer (Subcutaneous Tissue) Exposed: Yes VANN, MENDILLO ([id number]) [phone].pdf Page 7 of 9 Necrotic Quality: Adherent Slough Tendon Exposed: No Muscle Exposed: No Joint Exposed: No Bone Exposed: No Periwound Skin Texture Texture Color No Abnormalities Noted: No No Abnormalities Noted: No Scarring: Yes Rubor: Yes Moisture Temperature / Pain No Abnormalities Noted: No Temperature: No Abnormality Dry / Scaly: Yes Treatment Notes Wound #3 (Lower Leg) Wound Laterality: Right, Anterior Cleanser Soap and Water Discharge Instruction: May shower and wash wound with dial antibacterial soap and water prior to dressing change. Wound Cleanser Discharge Instruction:  Cleanse the wound with wound cleanser prior to applying a clean dressing using gauze sponges, not tissue or cotton balls. Peri-Wound Care Sween Lotion (Moisturizing lotion) Discharge Instruction: Apply moisturizing lotion as directed Topical Primary Dressing Endoform 2x2 in Discharge Instruction: Moisten with saline Secondary Dressing ABD Pad, 8x10 Discharge Instruction: Apply over primary dressing as directed. Woven Gauze Sponge, Non-Sterile 4x4 in Discharge Instruction: Apply over primary dressing as directed. Secured With [employer] Size 5, 10 (yds) Compression Wrap Kerlix Roll 4.5x3.1 (in/yd) Discharge Instruction: Apply Kerlix and Coban compression as directed. Coban Self-Adherent Wrap 4x5 (in/yd) Discharge Instruction: Apply over Kerlix as directed. Unnaboot w/Calamine, 4x10 (in/yd) Discharge Instruction: Apply Unnaboot to top of dressing and around ankles to prevent dressing from sliding Compression Stockings Add-Ons Electronic Signature(s) Signed: 05/11/2022 4:41:37 PM By: [name] Entered By: Blanche East on 05/11/2022 08:27:53 -------------------------------------------------------------------------------- Wound Assessment Details Patient Name: Date of Service: [name], [name] 05/11/2022 8:30 A M Medical Record Number: [id number] Patient Account Number: [id number] Date of Birth/Sex: Treating RN: 1950-05-24 (72 y.o. Michael [name] Session Primary Care Bubber Rothert: [name] Other Clinician: Referring Tazaria Dlugosz: Treating Machel Violante/Extender: [name] in Treatment: 22 Wound Status Wound Number: 4 Primary Dehisced Wound Etiology: Wound Location: Right, Medial Upper Leg Amore, Fue C ([id number]) [phone].pdf Page 8 of 9 Wound Open Wounding Event: Gradually Appeared Status: Date Acquired: 01/05/2022 Comorbid Chronic sinus problems/congestion, Hypertension, Peripheral Weeks Of Treatment: 17 History:  Arterial Disease, Peripheral Venous Disease, Type II Diabetes Clustered Wound: No Photos Wound Measurements Length: (cm) 2 Width: (cm) 1.4 Depth: (cm) 0.1 Area: (cm) 2.199 Volume: (cm) 0.22 % Reduction in Area: % Reduction in Volume: Epithelialization: Small (1-33%) Tunneling: No Undermining: No Wound Description Classification: Full Thickness Without Exposed Support Structures Wound Margin: Distinct, outline attached Exudate Amount: Medium Exudate Type: Serosanguineous Exudate Color: red, brown Foul Odor After Cleansing: No Slough/Fibrino Yes Wound Bed Granulation Amount: Small (1-33%) Exposed Structure Granulation Quality: Red, Pink Fascia Exposed: No Necrotic Amount: Large (67-100%) Fat Layer (Subcutaneous Tissue) Exposed: Yes Necrotic Quality: Eschar, Adherent Slough Tendon Exposed: No Muscle Exposed: No Joint Exposed: No Bone Exposed: No Periwound Skin Texture Texture Color No Abnormalities Noted: No No Abnormalities Noted: Yes Scarring: Yes Temperature / Pain Temperature: No Abnormality Moisture No Abnormalities Noted: Yes  Treatment Notes Wound #4 (Upper Leg) Wound Laterality: Right, Medial Cleanser Soap and Water Discharge Instruction: May shower and wash wound with dial antibacterial soap and water prior to dressing change. Wound Cleanser Discharge Instruction: Cleanse the wound with wound cleanser prior to applying a clean dressing using gauze sponges, not tissue or cotton balls. Peri-Wound Care Topical Primary Dressing Promogran Prisma Matrix, 4.34 (sq in) (silver collagen) Discharge Instruction: Moisten collagen with hydrogel Secondary Dressing Zetuvit Plus Silicone Border Dressing 4x4 (in/in) Discharge Instruction: Apply silicone border over primary dressing as directed. Secured With GIOVANNE, LINNANE ([id number]) [id number] Page 9 of 9 Compression Wrap Compression Stockings Add-Ons Electronic Signature(s) Signed:  05/11/2022 4:41:37 PM By: [name] Entered By: Blanche East on 05/11/2022 08:29:03 -------------------------------------------------------------------------------- Vitals Details Patient Name: Date of Service: [name], [name] 05/11/2022 8:30 A M Medical Record Number: [id number] Patient Account Number: [id number] Date of Birth/Sex: Treating RN: [birth year] (72 y.o. Michael [name] Session Primary Care Khamiya Varin: [name] Other Clinician: Referring Eleanor Dimichele: Treating Hussam Muniz/Extender: [name] in Treatment: 22 Vital Signs Time Taken: 08:18 Temperature (F): 97.9 Height (in): 72 Pulse (bpm): 94 Weight (lbs): 208 Respiratory Rate (breaths/min): 18 Body Mass Index (BMI): 28.2 Blood Pressure (mmHg): 122/62 Reference Range: 80 - 120 mg / dl Electronic Signature(s) Signed: 05/11/2022 8:33:04 AM By: [name] Entered By: Blanche East on 05/11/2022 08:33:04

## 2022-05-12 NOTE — Progress Notes (Signed)
YAW, KLICK ([id number]) [id number] Page 1 of 11 Visit Report for 05/11/2022 Chief Complaint Document Details Patient Name: Date of Service: Michael [name], Michael C. 05/11/2022 8:30 A M Medical Record Number: [id number] Patient Account Number: [id number] Date of Birth/Sex: Treating RN: [birth year] (72 y.o. M) Primary Care Provider: Karren [name] Other Clinician: Referring Provider: Treating Provider/Extender: Michael [name] in Treatment: 62 Information Obtained from: Patient Chief Complaint Patient presents to the wound care center today with an open arterial ulcer to the right lower extremity in the setting of diabetes mellitus. he has had this problem for 7 months 12/04/2021: ulcer to right lower anterior tibial surface Electronic Signature(s) Signed: 05/11/2022 9:38:55 AM By: Michael Maudlin MD FACS Entered By: Michael [name] on 05/11/2022 09:38:55 -------------------------------------------------------------------------------- Debridement Details Patient Name: Date of Service: Michael [name], Michael LTER C. 05/11/2022 8:30 A M Medical Record Number: [id number] Patient Account Number: [id number] Date of Birth/Sex: Treating RN: 31-Dec-1950 (72 y.o. [name] Primary Care Provider: Karren [name] Other Clinician: Referring Provider: Treating Provider/Extender: Michael [name] in Treatment: 22 Debridement Performed for Assessment: Wound #3 Right,Anterior Lower Leg Performed By: Physician Michael Maudlin, MD Debridement Type: Debridement Severity of Tissue Pre Debridement: Fat layer exposed Level of Consciousness (Pre-procedure): Awake and Alert Pre-procedure Verification/Time Out Yes - 08:43 Taken: Start Time: 08:44 Pain Control: Lidocaine 5% topical ointment T Area Debrided (L x W): otal 1.8 (cm) x 1.5 (cm) = 2.7 (cm) Tissue and other material debrided: Non-Viable, Eschar, Slough, Slough Level:  Non-Viable Tissue Debridement Description: Selective/Open Wound Instrument: Curette Bleeding: Minimum Hemostasis Achieved: Silver Nitrate Response to Treatment: Procedure was tolerated well Level of Consciousness (Post- Awake and Alert procedure): Post Debridement Measurements of Total Wound Length: (cm) 1.8 Width: (cm) 1.5 Depth: (cm) 0.1 Volume: (cm) 0.212 Character of Wound/Ulcer Post Debridement: Requires Further Debridement Severity of Tissue Post Debridement: Fat layer exposed Post Procedure Diagnosis Same as Pre-procedure Notes Scribed for Dr. Celine [name] by Michael East, RN Electronic Signature(s) Signed: 05/11/2022 4:41:37 PM By: Michael East RN [name] ([id number]) [id number] Page 2 of 11 Signed: 05/12/2022 7:40:18 AM By: Michael Maudlin MD FACS Entered By: Michael [name] on 05/11/2022 08:45:47 -------------------------------------------------------------------------------- Debridement Details Patient Name: Date of Service: Michael [name], Michael LTER C. 05/11/2022 8:30 A M Medical Record Number: [id number] Patient Account Number: [id number] Date of Birth/Sex: Treating RN: 1951/01/21 (72 y.o. [name] Primary Care Provider: Karren [name] Other Clinician: Referring Provider: Treating Provider/Extender: Michael [name] in Treatment: 22 Debridement Performed for Assessment: Wound #4 Right,Medial Upper Leg Performed By: Physician Michael Maudlin, MD Debridement Type: Debridement Level of Consciousness (Pre-procedure): Awake and Alert Pre-procedure Verification/Time Out Yes - 08:43 Taken: Start Time: 08:44 Pain Control: Lidocaine 5% topical ointment T Area Debrided (L x W): otal 2 (cm) x 1.4 (cm) = 2.8 (cm) Tissue and other material debrided: Non-Viable, Eschar, Slough, Slough Level: Non-Viable Tissue Debridement Description: Selective/Open Wound Instrument: Curette Bleeding: Minimum Hemostasis Achieved:  Silver Nitrate Response to Treatment: Procedure was tolerated well Level of Consciousness (Post- Awake and Alert procedure): Post Debridement Measurements of Total Wound Length: (cm) 2 Width: (cm) 1.4 Depth: (cm) 0.1 Volume: (cm) 0.22 Character of Wound/Ulcer Post Debridement: Requires Further Debridement Post Procedure Diagnosis Same as Pre-procedure Notes Scribed for Dr. Celine [name] by Michael East, RN Electronic Signature(s) Signed: 05/11/2022 4:41:37 PM By: Michael East RN Signed: 05/12/2022 7:40:18 AM By: Michael Maudlin MD FACS Entered By: Michael [name] on 05/11/2022 08:47:47 -------------------------------------------------------------------------------- HPI Details Patient Name:  Date of Service: Michael [name], Michael C. 05/11/2022 8:30 A M Medical Record Number: [id number] Patient Account Number: [id number] Date of Birth/Sex: Treating RN: [birth year] (72 y.o. M) Primary Care Provider: Karren [name] Other Clinician: Referring Provider: Treating Provider/Extender: Michael [name] in Treatment: 22 History of Present Illness Location: right lateral calf closer to the knee Quality: Patient reports experiencing a dull pain to affected area(s). Severity: Patient states wound(s) are getting worse. Duration: Patient has had the wound for > 7 months prior to seeking treatment at the wound center Timing: Pain in wound is constant (hurts all the time) Context: The wound would happen gradually Modifying Factors: Patient wound(s)/ulcer(s) are worsening due to : no resolution and a white material at the base of the wound ssociated Signs and Symptoms: Patient reports having:no discharge or purulent material A HPI Description: 72 year old gentleman who has been referred to was from his PCP for a chronic ulceration on his right lower extremity which she's had for several weeks. Past medical history significant for diabetes mellitus type 2, hypertension, hyperlipidemia,  anxiety, obesity, peripheral vascular disease and Michael [name] ([id number]) [id number] Page 3 of 11 chronic kidney disease. He has never been a smoker. Most recent lab work done at his PCPs office showed a glucose of 217 milligrams per deciliter which is consistent with hyperglycemia. In January 2017 recent hemoglobin A1c values noted to be 8.2%. In April 2017, he has been seen at the vascular office by Dr. Trula Slade and Dr. Kellie Simmering for right leg claudication. He had a right superficial femoral artery stent in September 2012. Recent noninvasive vascular imaging done on 06/24/2015 showed a right ABI of 1.07 with a triphasic waveform and a right TBI of 0.81. The left was noncompressible with a triphasic waveform and a TBI of 1.17. Right lower extremity arterial duplex was unable to identify stent exit but they were elevated velocities at the stent and in the distal thigh with triphasic waveforms. Dr. Stephens Shire opinion was to return to the clinic in 6 months with ABI and right lower extremity arterial duplex studies to be done. He has seen a dermatologist who had done a biopsy and said it was benign and he was given a steroid cream. 10/23/2015 -- Pathology report of the biopsy done in April 2017 shows ulcer with underlying angiodermatitis consistent with stasis dermatitis. 10/30/2015 -- he is going for shoulder surgery in about 10 days' time and was asking about the perioperative care. His blood sugars are running in the high 100s or low 200s. No hemoglobin A1c done recently. 11/20/2015 -- is back up to 2 weeks because of recent left shoulder surgery. 12/04/15; patient returns today with the wound for the most part looking healthy. No evidence of infection no debridement required. He is using Prisma for 4 weeks, without obvious improvement per her intake nurse. This started as several small open areas that were raised erythematous. He saw dermatology and at some point this  was biopsied that just suggested stasis skin physiology. This certainly doesn't look like that 12/11/15; using Hydrafera Blue. Previous biopsy reviewed, no atypia PAS negative. He has a history of stents in the right leg however he does not appear to have primary arterial insufficiency ABI in this clinic was over 0.9. 12/25/2015 -- he has been approved for grafix and we will order for some to be applied next week 01/01/2016 -- he has had his first application of Grafix today 01/08/2016 -- he has had his second application of Grafix  today READMISSION 12/04/2021 This is a now 72 year old man who was followed in our clinic about 5 years ago for an ulceration on his right lateral lower leg, just distal to the knee. He has since undergone a number of revascularization procedures that have been complicated by restenosis and wound infections. During the course of this treatment, he developed a small ulcer on his right anterior tibial surface. Despite use of an Unna boot, the wound has continued to expand. He was referred to the wound care center by Dr. Trula Slade for further evaluation and management. On the right anterior tibial surface, there is an irregular wound with heavy slough and eschar accumulation. The periwound skin is intact but he does have 1-2+ pitting edema. There is no purulent drainage or malodor. 10/13; second visit for this man who has an ischemic wound in the setting of type 2 diabetes on the right anterior mid tibia area. He has been revascularized. Using Santyl gent 12/22/2021: The wound is about the same size this week. There is a lot of gray sloughy material on the surface, secondary to silver nitrate used to stop bleeding after what sounds like a fairly aggressive debridement last week. 12/30/2021: The wound is smaller this week and a bit cleaner. Edema control is excellent. There is still a bit of slough accumulation. 01/07/2022: The wound continues to contract and is much cleaner  this week. Edema control is very good. He has small openings in his upper leg surgical scars, but he is going to be seeing Dr. Trula Slade next Monday and will have him take a look. He has been applying manuka honey to both of the sites. We have been using Santyl and gentamicin on his lower leg wound. 01/14/2022: He saw vascular surgery this week and was told that they were happy to have [place] manage the 2 wounds from his operation. Both of these have a light layer of slough on the surface. The more distal wound is a little bit dry. The anterior tibial wound continues to accumulate a fair amount of slough. Edema control is good. 01/19/2022: Both upper leg wounds are smaller. The more distal is quite dry and cracked when I was examining it. The lower leg wound is more painful and the surrounding tissue is erythematous and indurated. 01/26/2022: The upper leg wounds are healed. The skin at the distal leg wound is quite dry. The lower leg wound is cleaner but still fairly painful. His wrap slid again this week. 02/02/2022: We had good success keeping his wrap intact using a standard Unna boot. The lower leg wound continues to contract and is clean and less painful this week. 12/11; TheraSkin #1 12/18; TheraSkin #2. Wound looks as though it is contracted. Patient states that his pain was better in the leg. Secondary dressing and kerlix Coban 02/24/2022: The wound is smaller since I saw it last. It is less painful and there is good granulation tissue, including over the exposed tendon. 03/09/2022: The wound continues to contract. There is continued fill of the wound cavity with granulation tissue. 03/16/2022: The wound is smaller again today. There is good granulation tissue forming, covering the tendon. 03/30/2022: The wound continues to contract and fill with good granulation tissue. The granulation tissue is oozing a little bit at the lateral edge. 04/13/2022: We left his TheraSkin on an extra week because it was  looking so good. The wound is smaller this week and flush with the surrounding skin. His medial thigh wound has opened up again. There  is slough and eschar present. 04/20/2022: The anterior tibial wound continues to contract. There is good granulation tissue with just a light layer of slough. The medial thigh ulcer is superficial with slough present. It is a bit fibrotic due to being in a scar. 04/27/2022: The anterior tibial wound continues to contract. There is still some slough buildup with good granulation tissue. The medial thigh ulcer is very leathery and dry. 05/04/2022: The anterior tibial wound is even smaller today. There is no visible tendon at all. He has some slough and eschar accumulation. The medial thigh ulcer continues to be quite dry with a leathery surface. 05/11/2022: The anterior tibial wound continues to contract. There is a little bit of eschar and slough buildup. The granulation tissue is also a bit hypertrophic. The medial thigh ulcer is dry and leathery, once again. Electronic Signature(s) Signed: 05/11/2022 9:39:27 AM By: Michael Maudlin MD FACS [name] ([id number]) [id number] Page 4 of 11 Entered By: Michael [name] on 05/11/2022 09:39:27 -------------------------------------------------------------------------------- Physical Exam Details Patient Name: Date of Service: Michael [name], Michael C. 05/11/2022 8:30 A M Medical Record Number: [id number] Patient Account Number: [id number] Date of Birth/Sex: Treating RN: 21-Apr-1950 (72 y.o. M) Primary Care Provider: Karren [name] Other Clinician: Referring Provider: Treating Provider/Extender: Michael [name] in Treatment: 22 Constitutional . . . . no acute distress. Respiratory Normal work of breathing on room air. Notes 05/11/2022: The anterior tibial wound continues to contract. There is a little bit of eschar and slough buildup. The granulation tissue is also a  bit hypertrophic. The medial thigh ulcer is dry and leathery, once again. Electronic Signature(s) Signed: 05/11/2022 9:40:01 AM By: Michael Maudlin MD FACS Entered By: Michael [name] on 05/11/2022 09:40:01 -------------------------------------------------------------------------------- Physician Orders Details Patient Name: Date of Service: Michael [name], Michael LTER C. 05/11/2022 8:30 A M Medical Record Number: [id number] Patient Account Number: [id number] Date of Birth/Sex: Treating RN: [birth year] (72 y.o. [name] Primary Care Provider: Karren [name] Other Clinician: Referring Provider: Treating Provider/Extender: Michael [name] in Treatment: 16 Verbal / Phone Orders: No Diagnosis Coding ICD-10 Coding Code Description [phone] 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 I70.239 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 E11.622 Type 2 diabetes mellitus with other skin ulcer I73.9 Peripheral vascular disease, unspecified L97.122 Non-pressure chronic ulcer of left thigh with fat layer exposed Follow-up Appointments ppointment in 1 week. - Dr. Celine [name] - Room 3 Return A Other: - Doctor's note to return back to work (05/04/22) Anesthetic Wound #3 Right,Anterior Lower Leg (In clinic) Topical Lidocaine 4% applied to wound bed Edema Control - Lymphedema / SCD / Other Right Lower Extremity Avoid standing for long periods of time. Wound Treatment Wound #3 - Lower Leg Wound Laterality: Right, Anterior Cleanser: Soap and Water 1 x Per Week/30 Days Discharge Instructions: May shower and wash wound with dial antibacterial soap and water prior to dressing change. Cleanser: Wound Cleanser 1 x Per Week/30 Days Discharge Instructions: Cleanse the wound with wound cleanser prior to applying a clean dressing using gauze sponges, not tissue or cotton  balls. Peri-Wound Care: Sween Lotion (Moisturizing lotion) 1 x Per Week/30 Days Discharge Instructions: Apply moisturizing lotion as directed NORWOOD, DENHART ([id number]) [phone].pdf Page 5 of 11 Prim Dressing: Endoform 2x2 in 1 x Per Week/30 Days ary Discharge Instructions: Moisten with saline Secondary Dressing: ABD Pad, 8x10 1 x Per Week/30 Days  Discharge Instructions: Apply over primary dressing as directed. Secondary Dressing: Woven Gauze Sponge, Non-Sterile 4x4 in 1 x Per Week/30 Days Discharge Instructions: Apply over primary dressing as directed. Secured With: [employer] Size 5, 10 (yds) 1 x Per Week/30 Days Compression Wrap: Kerlix Roll 4.5x3.1 (in/yd) 1 x Per Week/30 Days Discharge Instructions: Apply Kerlix and Coban compression as directed. Compression Wrap: Coban Self-Adherent Wrap 4x5 (in/yd) 1 x Per Week/30 Days Discharge Instructions: Apply over Kerlix as directed. Compression Wrap: Unnaboot w/Calamine, 4x10 (in/yd) 1 x Per Week/30 Days Discharge Instructions: Apply Unnaboot to top of dressing and around ankles to prevent dressing from sliding Wound #4 - Upper Leg Wound Laterality: Right, Medial Cleanser: Soap and Water 1 x Per Week/30 Days Discharge Instructions: May shower and wash wound with dial antibacterial soap and water prior to dressing change. Cleanser: Wound Cleanser 1 x Per Week/30 Days Discharge Instructions: Cleanse the wound with wound cleanser prior to applying a clean dressing using gauze sponges, not tissue or cotton balls. Prim Dressing: Endoform 2x2 in 1 x Per Week/30 Days ary Discharge Instructions: Moisten with hydrogel Secondary Dressing: Zetuvit Plus Silicone Border Dressing 4x4 (in/in) 1 x Per Week/30 Days Discharge Instructions: Apply silicone border over primary dressing as directed. Electronic Signature(s) Signed: 05/12/2022 7:40:18 AM By: Michael Maudlin MD FACS Previous Signature: 05/11/2022 8:33:52 AM Version  By: Michael East RN Entered By: Michael [name] on 05/11/2022 09:40:24 -------------------------------------------------------------------------------- Problem List Details Patient Name: Date of Service: Michael [name], Michael LTER C. 05/11/2022 8:30 A M Medical Record Number: [id number] Patient Account Number: [id number] Date of Birth/Sex: Treating RN: [birth year] (72 y.o. M) Primary Care Provider: Karren [name] Other Clinician: Referring Provider: Treating Provider/Extender: Michael [name] in Treatment: 24 Active Problems ICD-10 Encounter Code Description Active Date MDM Diagnosis L97.815 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 12/04/2021 No Yes involvement without evidence of necrosis I70.239 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 12/04/2021 No Yes E11.622 Type 2 diabetes mellitus with other skin ulcer 12/04/2021 No Yes I73.9 Peripheral vascular disease, unspecified 12/04/2021 No Yes Latin, Lewin C ([id number]) [phone].pdf Page 6 of 11 L97.122 Non-pressure chronic ulcer of left thigh with fat layer exposed 01/14/2022 No Yes Inactive Problems Resolved Problems Electronic Signature(s) Signed: 05/11/2022 9:38:41 AM By: Michael Maudlin MD FACS Entered By: Michael [name] on 05/11/2022 09:38:40 -------------------------------------------------------------------------------- Progress Note Details Patient Name: Date of Service: Michael [name], Michael LTER C. 05/11/2022 8:30 A M Medical Record Number: [id number] Patient Account Number: [id number] Date of Birth/Sex: Treating RN: 1950/04/09 (72 y.o. M) Primary Care Provider: Karren [name] Other Clinician: Referring Provider: Treating Provider/Extender: Michael [name] in Treatment: 22 Subjective Chief Complaint Information obtained from Patient Patient presents to the wound care center today with an  open arterial ulcer to the right lower extremity in the setting of diabetes mellitus. he has had this problem for 7 months 12/04/2021: ulcer to right lower anterior tibial surface History of Present Illness (HPI) The following HPI elements were documented for the patient's wound: Location: right lateral calf closer to the knee Quality: Patient reports experiencing a dull pain to affected area(s). Severity: Patient states wound(s) are getting worse. Duration: Patient has had the wound for > 7 months prior to seeking treatment at the wound center Timing: Pain in wound is constant (hurts all the time) Context: The wound would happen gradually Modifying Factors: Patient wound(s)/ulcer(s) are worsening due to : no resolution and a white  material at the base of the wound Associated Signs and Symptoms: Patient reports having:no discharge or purulent material 72 year old gentleman who has been referred to was from his PCP for a chronic ulceration on his right lower extremity which she's had for several weeks. Past medical history significant for diabetes mellitus type 2, hypertension, hyperlipidemia, anxiety, obesity, peripheral vascular disease and chronic kidney disease. He has never been a smoker. Most recent lab work done at his PCPs office showed a glucose of 217 milligrams per deciliter which is consistent with hyperglycemia. In January 2017 recent hemoglobin A1c values noted to be 8.2%. In April 2017, he has been seen at the vascular office by Dr. Trula Slade and Dr. Kellie Simmering for right leg claudication. He had a right superficial femoral artery stent in September 2012. Recent noninvasive vascular imaging done on 06/24/2015 showed a right ABI of 1.07 with a triphasic waveform and a right TBI of 0.81. The left was noncompressible with a triphasic waveform and a TBI of 1.17. Right lower extremity arterial duplex was unable to identify stent exit but they were elevated velocities at the stent and in the  distal thigh with triphasic waveforms. Dr. Stephens Shire opinion was to return to the clinic in 6 months with ABI and right lower extremity arterial duplex studies to be done. He has seen a dermatologist who had done a biopsy and said it was benign and he was given a steroid cream. 10/23/2015 -- Pathology report of the biopsy done in April 2017 shows ulcer with underlying angiodermatitis consistent with stasis dermatitis. 10/30/2015 -- he is going for shoulder surgery in about 10 days' time and was asking about the perioperative care. His blood sugars are running in the high 100s or low 200s. No hemoglobin A1c done recently. 11/20/2015 -- is back up to 2 weeks because of recent left shoulder surgery. 12/04/15; patient returns today with the wound for the most part looking healthy. No evidence of infection no debridement required. He is using Prisma for 4 weeks, without obvious improvement per her intake nurse. This started as several small open areas that were raised erythematous. He saw dermatology and at some point this was biopsied that just suggested stasis skin physiology. This certainly doesn't look like that 12/11/15; using Hydrafera Blue. Previous biopsy reviewed, no atypia PAS negative. He has a history of stents in the right leg however he does not appear to have primary arterial insufficiency ABI in this clinic was over 0.9. 12/25/2015 -- he has been approved for grafix and we will order for some to be applied next week 01/01/2016 -- he has had his first application of Grafix today 01/08/2016 -- he has had his second application of Grafix today READMISSION 12/04/2021 This is a now 72 year old man who was followed in our clinic about 5 years ago for an ulceration on his right lateral lower leg, just distal to the knee. He has since undergone a number of revascularization procedures that have been complicated by restenosis and wound infections. During the course of this treatment, he developed  a small ulcer on his right anterior tibial surface. Despite use of an Unna boot, the wound has continued to expand. He was referred to the wound care center by Dr. Trula Slade for further evaluation and management. On the right anterior tibial surface, there is an irregular wound with heavy slough and eschar accumulation. The periwound skin is intact but he does have 1-2+ pitting edema. There is no purulent drainage or malodor. 10/13; second visit for this man  who has an ischemic wound in the setting of type 2 diabetes on the right anterior mid tibia area. He has been revascularized. Using Santyl gent 12/22/2021: The wound is about the same size this week. There is a lot of gray sloughy material on the surface, secondary to silver nitrate used to stop Michael [name], Michael [name] ([id number]) [phone].pdf Page 7 of 11 bleeding after what sounds like a fairly aggressive debridement last week. 12/30/2021: The wound is smaller this week and a bit cleaner. Edema control is excellent. There is still a bit of slough accumulation. 01/07/2022: The wound continues to contract and is much cleaner this week. Edema control is very good. He has small openings in his upper leg surgical scars, but he is going to be seeing Dr. Trula Slade next Monday and will have him take a look. He has been applying manuka honey to both of the sites. We have been using Santyl and gentamicin on his lower leg wound. 01/14/2022: He saw vascular surgery this week and was told that they were happy to have [place] manage the 2 wounds from his operation. Both of these have a light layer of slough on the surface. The more distal wound is a little bit dry. The anterior tibial wound continues to accumulate a fair amount of slough. Edema control is good. 01/19/2022: Both upper leg wounds are smaller. The more distal is quite dry and cracked when I was examining it. The lower leg wound is more painful and the surrounding tissue is  erythematous and indurated. 01/26/2022: The upper leg wounds are healed. The skin at the distal leg wound is quite dry. The lower leg wound is cleaner but still fairly painful. His wrap slid again this week. 02/02/2022: We had good success keeping his wrap intact using a standard Unna boot. The lower leg wound continues to contract and is clean and less painful this week. 12/11; TheraSkin #1 12/18; TheraSkin #2. Wound looks as though it is contracted. Patient states that his pain was better in the leg. Secondary dressing and kerlix Coban 02/24/2022: The wound is smaller since I saw it last. It is less painful and there is good granulation tissue, including over the exposed tendon. 03/09/2022: The wound continues to contract. There is continued fill of the wound cavity with granulation tissue. 03/16/2022: The wound is smaller again today. There is good granulation tissue forming, covering the tendon. 03/30/2022: The wound continues to contract and fill with good granulation tissue. The granulation tissue is oozing a little bit at the lateral edge. 04/13/2022: We left his TheraSkin on an extra week because it was looking so good. The wound is smaller this week and flush with the surrounding skin. His medial thigh wound has opened up again. There is slough and eschar present. 04/20/2022: The anterior tibial wound continues to contract. There is good granulation tissue with just a light layer of slough. The medial thigh ulcer is superficial with slough present. It is a bit fibrotic due to being in a scar. 04/27/2022: The anterior tibial wound continues to contract. There is still some slough buildup with good granulation tissue. The medial thigh ulcer is very leathery and dry. 05/04/2022: The anterior tibial wound is even smaller today. There is no visible tendon at all. He has some slough and eschar accumulation. The medial thigh ulcer continues to be quite dry with a leathery surface. 05/11/2022: The anterior  tibial wound continues to contract. There is a little bit of eschar and slough buildup. The granulation tissue  is also a bit hypertrophic. The medial thigh ulcer is dry and leathery, once again. Patient History Information obtained from Patient. Family History Cancer - Siblings, Heart Disease - Father, Hypertension - Father,Mother, No family history of Hereditary Spherocytosis, Kidney Disease, Seizures, Stroke, Thyroid Problems, Tuberculosis. Social History Former smoker - smokeless tobacco, Marital Status - Married, Alcohol Use - Rarely - WINE, Drug Use - No History, Caffeine Use - Daily - COFFEE. Medical History Ear/Nose/Mouth/Throat Patient has history of Chronic sinus problems/congestion - seasonal allergies Cardiovascular Patient has history of Hypertension, Peripheral Arterial Disease - STENTS, Peripheral Venous Disease Endocrine Patient has history of Type II Diabetes - last A1c- 8.2 Hospitalization/Surgery History - left shoulder surgery. Medical A Surgical History Notes nd Constitutional Symptoms (General Health) obesity , h/o (R) leg claudication (right fem stent September 2012) Cardiovascular hyperlipidemia Endocrine pt. on diet intentionally losing 20 pounds since December 2016 in an effort to improve A1C levels Integumentary (Skin) psoriatic arthritis Musculoskeletal bilat knee pain identified as an ortho issue psoriatic arthritis Oncologic Prostate cancer 2018 Psychiatric anxiety Michael [name], Michael [name] ([id number]) [id number] Page 8 of 11 Objective Constitutional no acute distress. Vitals Time Taken: 8:18 AM, Height: 72 in, Weight: 208 lbs, BMI: 28.2, Temperature: 97.9 F, Pulse: 94 bpm, Respiratory Rate: 18 breaths/min, Blood Pressure: 122/62 mmHg. Respiratory Normal work of breathing on room air. General Notes: 05/11/2022: The anterior tibial wound continues to contract. There is a little bit of eschar and slough buildup. The granulation  tissue is also a bit hypertrophic. The medial thigh ulcer is dry and leathery, once again. Integumentary (Hair, Skin) Wound #3 status is Open. Original cause of wound was Blister. The date acquired was: 09/30/2021. The wound has been in treatment 22 weeks. The wound is located on the Right,Anterior Lower Leg. The wound measures 1.8cm length x 1.5cm width x 0.1cm depth; 2.121cm^2 area and 0.212cm^3 volume. There is Fat Layer (Subcutaneous Tissue) exposed. There is no tunneling or undermining noted. There is a medium amount of serosanguineous drainage noted. The wound margin is distinct with the outline attached to the wound base. There is large (67-100%) pink granulation within the wound bed. There is a small (1-33%) amount of necrotic tissue within the wound bed including Adherent Slough. The periwound skin appearance exhibited: Scarring, Dry/Scaly, Rubor. Periwound temperature was noted as No Abnormality. Wound #4 status is Open. Original cause of wound was Gradually Appeared. The date acquired was: 01/05/2022. The wound has been in treatment 17 weeks. The wound is located on the Right,Medial Upper Leg. The wound measures 2cm length x 1.4cm width x 0.1cm depth; 2.199cm^2 area and 0.22cm^3 volume. There is Fat Layer (Subcutaneous Tissue) exposed. There is no tunneling or undermining noted. There is a medium amount of serosanguineous drainage noted. The wound margin is distinct with the outline attached to the wound base. There is small (1-33%) red, pink granulation within the wound bed. There is a large (67- 100%) amount of necrotic tissue within the wound bed including Eschar and Adherent Slough. The periwound skin appearance had no abnormalities noted for moisture. The periwound skin appearance had no abnormalities noted for color. The periwound skin appearance exhibited: Scarring. Periwound temperature was noted as No Abnormality. 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 There was a Selective/Open Wound Non-Viable Tissue Debridement with a total area of 2.7 sq cm performed by Michael Maudlin, MD. With the following instrument(s): Curette to remove Non-Viable tissue/material. Material removed includes Eschar and Slough and after achieving pain control using Lidocaine 5% topical ointment. No specimens were taken. A time out was conducted at 08:43, prior to the start of the procedure. A Minimum amount of bleeding was controlled with Silver Nitrate. The procedure was tolerated well. Post Debridement Measurements: 1.8cm length x 1.5cm width x 0.1cm depth; 0.212cm^3 volume. Character of Wound/Ulcer Post Debridement requires further debridement. Severity of Tissue Post Debridement is: Fat layer exposed. Post procedure Diagnosis Wound #3: Same as Pre-Procedure General Notes: Scribed for Dr. Celine [name] by Michael East, RN. Wound #4 Pre-procedure diagnosis of Wound #4 is a Dehisced Wound located on the Right,Medial Upper Leg . There was a Selective/Open Wound Non-Viable Tissue Debridement with a total area of 2.8 sq cm performed by Michael Maudlin, MD. With the following instrument(s): Curette to remove Non-Viable tissue/material. Material removed includes Eschar and Slough and after achieving pain control using Lidocaine 5% topical ointment. No specimens were taken. A time out was conducted at 08:43, prior to the start of the procedure. A Minimum amount of  bleeding was controlled with Silver Nitrate. The procedure was tolerated well. Post Debridement Measurements: 2cm length x 1.4cm width x 0.1cm depth; 0.22cm^3 volume. Character of Wound/Ulcer Post Debridement requires further debridement. Post procedure Diagnosis Wound #4: Same as Pre-Procedure General Notes: Scribed for Dr. Celine [name] by Michael East, RN. Plan Follow-up Appointments: Return Appointment in 1 week. - Dr. Celine [name] - Room [address], [place] C ([id number]) [id number] Page 9 of 11 Other: - Doctor's note to return back to work (05/04/22) Anesthetic: Wound #3 Right,Anterior Lower Leg: (In clinic) Topical Lidocaine 4% applied to wound bed Edema Control - Lymphedema / SCD / Other: Avoid standing for long periods of time. WOUND #3: - Lower Leg Wound Laterality: Right, Anterior Cleanser: Soap and Water 1 x Per Week/30 Days Discharge Instructions: May shower and wash wound with dial antibacterial soap and water prior to dressing change. Cleanser: Wound Cleanser 1 x Per Week/30 Days Discharge Instructions: Cleanse the wound with wound cleanser prior to applying a clean dressing using gauze sponges, not tissue or cotton balls. Peri-Wound Care: Sween Lotion (Moisturizing lotion) 1 x Per Week/30 Days Discharge Instructions: Apply moisturizing lotion as directed Prim Dressing: Endoform 2x2 in 1 x Per Week/30 Days ary Discharge Instructions: Moisten with saline Secondary Dressing: ABD Pad, 8x10 1 x Per Week/30 Days Discharge Instructions: Apply over primary dressing as directed. Secondary Dressing: Woven Gauze Sponge, Non-Sterile 4x4 in 1 x Per Week/30 Days Discharge Instructions: Apply over primary dressing as directed. Secured With: [employer] Size 5, 10 (yds) 1 x Per Week/30 Days Com pression Wrap: Kerlix Roll 4.5x3.1 (in/yd) 1 x Per Week/30 Days Discharge Instructions: Apply Kerlix and Coban compression as directed. Com pression Wrap: Coban Self-Adherent Wrap  4x5 (in/yd) 1 x Per Week/30 Days Discharge Instructions: Apply over Kerlix as directed. Com pression Wrap: Unnaboot w/Calamine, 4x10 (in/yd) 1 x Per Week/30 Days Discharge Instructions: Apply Unnaboot to top of dressing and around ankles to prevent dressing from sliding WOUND #4: - Upper Leg Wound Laterality: Right, Medial Cleanser: Soap and Water 1 x Per Week/30 Days  Discharge Instructions: May shower and wash wound with dial antibacterial soap and water prior to dressing change. Cleanser: Wound Cleanser 1 x Per Week/30 Days Discharge Instructions: Cleanse the wound with wound cleanser prior to applying a clean dressing using gauze sponges, not tissue or cotton balls. Prim Dressing: Endoform 2x2 in 1 x Per Week/30 Days ary Discharge Instructions: Moisten with hydrogel Secondary Dressing: Zetuvit Plus Silicone Border Dressing 4x4 (in/in) 1 x Per Week/30 Days Discharge Instructions: Apply silicone border over primary dressing as directed. 05/11/2022: The anterior tibial wound continues to contract. There is a little bit of eschar and slough buildup. The granulation tissue is also a bit hypertrophic. The medial thigh ulcer is dry and leathery, once again. I used a curette to debride slough and eschar from the anterior tibial wound. I then used silver nitrate to chemically cauterize the hypertrophic granulation tissue. I used a curette to debride the leathery eschar and slough from the medial thigh ulcer. I am going to use endoform on both sites this week, with liberal application of hydrogel to the medial thigh ulcer. He will have a nurse visit next week to have his dressings changed and follow-up with me in 2 weeks. Electronic Signature(s) Signed: 05/11/2022 9:42:08 AM By: Michael Maudlin MD FACS Entered By: Michael [name] on 05/11/2022 09:42:07 -------------------------------------------------------------------------------- HxROS Details Patient Name: Date of Service: Michael [name], Michael LTER C.  05/11/2022 8:30 A M Medical Record Number: [id number] Patient Account Number: [id number] Date of Birth/Sex: Treating RN: Oct 20, 1950 (72 y.o. M) Primary Care Provider: Karren [name] Other Clinician: Referring Provider: Treating Provider/Extender: Michael [name] in Treatment: 22 Information Obtained From Patient Constitutional Symptoms ([place]) Medical History: Past Medical History Notes: obesity , h/o (R) leg claudication (right fem stent September 2012) Ear/Nose/Mouth/Throat Medical History: Positive for: Chronic sinus problems/congestion - seasonal allergies Cardiovascular Medical History: Positive for: Hypertension; Peripheral Arterial Disease - STENTS; Peripheral Venous Disease Past Medical History Notes: hyperlipidemia SHAQUIELLE, GRANGER ([id number]) [phone].pdf Page 10 of 11 Endocrine Medical History: Positive for: Type II Diabetes - last A1c- 8.2 Past Medical History Notes: pt. on diet intentionally losing 20 pounds since December 2016 in an effort to improve A1C levels Time with diabetes: 9 YRS Treated with: Insulin Blood sugar tested every day: Yes Tested : 7-8 TIMES Integumentary (Skin) Medical History: Past Medical History Notes: psoriatic arthritis Musculoskeletal Medical History: Past Medical History Notes: bilat knee pain identified as an ortho issue psoriatic arthritis Oncologic Medical History: Past Medical History Notes: Prostate cancer 2018 Psychiatric Medical History: Past Medical History Notes: anxiety HBO Extended History Items Ear/Nose/Mouth/Throat: Chronic sinus problems/congestion Immunizations Pneumococcal Vaccine: Received Pneumococcal Vaccination: Yes Received Pneumococcal Vaccination On or After 60th Birthday: Yes Implantable Devices None Hospitalization / Surgery History Type of Hospitalization/Surgery left shoulder surgery Family and Social History Cancer: Yes - Siblings;  Heart Disease: Yes - Father; Hereditary Spherocytosis: No; Hypertension: Yes - Father,Mother; Kidney Disease: No; Seizures: No; Stroke: No; Thyroid Problems: No; Tuberculosis: No; Former smoker - smokeless tobacco; Marital Status - Married; Alcohol Use: Rarely - WINE; Drug Use: No History; Caffeine Use: Daily - COFFEE; Financial Concerns: No; Food, Clothing or Shelter Needs: No; Support System Lacking: No; Transportation Concerns: No Electronic Signature(s) Signed: 05/12/2022 7:40:18 AM By: Michael Maudlin MD FACS Entered By: Michael [name] on 05/11/2022 09:39:33 -------------------------------------------------------------------------------- SuperBill Details Patient Name: Date of Service: Michael [name], Michael LTER C. 05/11/2022 Medical Record Number: [id number] Patient Account Number: [id number] Date of Birth/Sex: Treating RN: May 26, 1950 (72 y.o. 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 E11.622 Type 2 diabetes mellitus with other skin ulcer I73.9 Peripheral vascular disease, unspecified L97.122 Non-pressure chronic ulcer of left thigh with fat layer exposed Facility Procedures : CPT4 Code Description: [id number] - DEBRIDE WOUND 1ST 20 SQ CM OR < ICD-10 Diagnosis Description L97.815 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 involvemen L97.122 Non-pressure chronic ulcer of left thigh with fat  layer exposed Modifier: t without evidence Quantity: 1 of necrosis Physician Procedures : CPT4 Code  Description Modifier [id number] - WC PHYS LEVEL 3 - EST PT 25 ICD-10 Diagnosis Description L97.815 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 without evidence L97.122 Non-pressure chronic ulcer of  left thigh with fat layer exposed E11.622 Type 2 diabetes mellitus with other skin ulcer I73.9 Peripheral vascular disease, unspecified Quantity: 1 of necrosis : [id number] - WC PHYS DEBR WO ANESTH 20 SQ CM ICD-10 Diagnosis Description L97.815 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 without evidence L97.122 Non-pressure chronic ulcer of left thigh with fat layer  exposed Quantity: 1 of necrosis Electronic Signature(s) Signed: 05/11/2022 9:42:29 AM By: Michael Maudlin MD FACS Entered By: Michael [name] on 05/11/2022 09:42:29

## 2022-05-18 ENCOUNTER — Encounter (HOSPITAL_BASED_OUTPATIENT_CLINIC_OR_DEPARTMENT_OTHER): Payer: Medicare Other | Admitting: Internal Medicine

## 2022-05-18 DIAGNOSIS — L97815 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: Secondary | ICD-10-CM | POA: Diagnosis not present

## 2022-05-18 DIAGNOSIS — E669 Obesity, unspecified: Secondary | ICD-10-CM | POA: Diagnosis not present

## 2022-05-18 DIAGNOSIS — E1151 Type 2 diabetes mellitus with diabetic peripheral angiopathy without gangrene: Secondary | ICD-10-CM | POA: Diagnosis not present

## 2022-05-18 DIAGNOSIS — E1122 Type 2 diabetes mellitus with diabetic chronic kidney disease: Secondary | ICD-10-CM | POA: Diagnosis not present

## 2022-05-18 DIAGNOSIS — L97122 Non-pressure chronic ulcer of left thigh with fat layer exposed: Secondary | ICD-10-CM | POA: Diagnosis not present

## 2022-05-18 DIAGNOSIS — I70239 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: Secondary | ICD-10-CM | POA: Diagnosis not present

## 2022-05-18 DIAGNOSIS — Z6828 Body mass index (BMI) 28.0-28.9, adult: Secondary | ICD-10-CM | POA: Diagnosis not present

## 2022-05-18 DIAGNOSIS — E11622 Type 2 diabetes mellitus with other skin ulcer: Secondary | ICD-10-CM | POA: Diagnosis not present

## 2022-05-18 DIAGNOSIS — I129 Hypertensive chronic kidney disease with stage 1 through stage 4 chronic kidney disease, or unspecified chronic kidney disease: Secondary | ICD-10-CM | POA: Diagnosis not present

## 2022-05-18 DIAGNOSIS — N189 Chronic kidney disease, unspecified: Secondary | ICD-10-CM | POA: Diagnosis not present

## 2022-05-19 NOTE — Progress Notes (Signed)
RAYWOOD, AXON ([id number]) [id number] Page 1 of 1 Visit Report for 05/18/2022 SuperBill Details Patient Name: Date of Service: [name] [name] 05/18/2022 Medical Record Number: [id number] Patient Account Number: [id number] Date of Birth/Sex: Treating RN: [birth year] (72 y.o. 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

## 2022-05-25 ENCOUNTER — Encounter (HOSPITAL_BASED_OUTPATIENT_CLINIC_OR_DEPARTMENT_OTHER): Payer: Medicare Other | Admitting: General Surgery

## 2022-05-25 DIAGNOSIS — E1151 Type 2 diabetes mellitus with diabetic peripheral angiopathy without gangrene: Secondary | ICD-10-CM | POA: Diagnosis not present

## 2022-05-25 DIAGNOSIS — I129 Hypertensive chronic kidney disease with stage 1 through stage 4 chronic kidney disease, or unspecified chronic kidney disease: Secondary | ICD-10-CM | POA: Diagnosis not present

## 2022-05-25 DIAGNOSIS — E1122 Type 2 diabetes mellitus with diabetic chronic kidney disease: Secondary | ICD-10-CM | POA: Diagnosis not present

## 2022-05-25 DIAGNOSIS — E11622 Type 2 diabetes mellitus with other skin ulcer: Secondary | ICD-10-CM | POA: Diagnosis not present

## 2022-05-25 DIAGNOSIS — L97812 Non-pressure chronic ulcer of other part of right lower leg with fat layer exposed: Secondary | ICD-10-CM | POA: Diagnosis not present

## 2022-05-25 DIAGNOSIS — L97122 Non-pressure chronic ulcer of left thigh with fat layer exposed: Secondary | ICD-10-CM | POA: Diagnosis not present

## 2022-05-25 DIAGNOSIS — E669 Obesity, unspecified: Secondary | ICD-10-CM | POA: Diagnosis not present

## 2022-05-25 DIAGNOSIS — N189 Chronic kidney disease, unspecified: Secondary | ICD-10-CM | POA: Diagnosis not present

## 2022-05-25 DIAGNOSIS — T8131XA Disruption of external operation (surgical) wound, not elsewhere classified, initial encounter: Secondary | ICD-10-CM | POA: Diagnosis not present

## 2022-05-25 DIAGNOSIS — L97815 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: Secondary | ICD-10-CM | POA: Diagnosis not present

## 2022-05-25 DIAGNOSIS — Z6828 Body mass index (BMI) 28.0-28.9, adult: Secondary | ICD-10-CM | POA: Diagnosis not present

## 2022-05-25 DIAGNOSIS — I70239 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: Secondary | ICD-10-CM | POA: Diagnosis not present

## 2022-05-26 NOTE — Progress Notes (Signed)
Michael [name], Michael [name] ([id number]) [id number] Page 1 of 11 Visit Report for 05/25/2022 Chief Complaint Document Details Patient Name: Date of Service: [name], [name] 05/25/2022 9:30 A M Medical Record Number: [id number] Patient Account Number: [id number] Date of Birth/Sex: Treating RN: [birth year] (72 y.o. M) Primary Care Provider: [name] Other Clinician: Referring Provider: Treating Provider/Extender: [name] in Treatment: 24 Information Obtained from: Patient Chief Complaint Patient presents to the wound care center today with an open arterial ulcer to the right lower extremity in the setting of diabetes mellitus. he has had this problem for 7 months 12/04/2021: ulcer to right lower anterior tibial surface Electronic Signature(s) Signed: 05/25/2022 10:25:00 AM By: [name] Entered By: Fredirick Maudlin on 05/25/2022 10:25:00 -------------------------------------------------------------------------------- Debridement Details Patient Name: Date of Service: [name], [name] C. 05/25/2022 9:30 A M Medical Record Number: [id number] Patient Account Number: [id number] Date of Birth/Sex: Treating RN: [birth year] (72 y.o. [name] Primary Care Provider: [name] Other Clinician: Referring Provider: Treating Provider/Extender: [name] in Treatment: 24 Debridement Performed for Assessment: Wound #3 Right,Anterior Lower Leg Performed By: Physician Fredirick Maudlin, MD Debridement Type: Debridement Severity of Tissue Pre Debridement: Fat layer exposed Level of Consciousness (Pre-procedure): Awake and Alert Pre-procedure Verification/Time Out Yes - 09:58 Taken: Start Time: 09:58 Pain Control: Lidocaine 5% topical ointment T Area Debrided (L x W): otal 1.6 (cm) x 1.4 (cm) = 2.24 (cm) Tissue and other material debrided: Non-Viable, Eschar, Slough, Slough Level:  Non-Viable Tissue Debridement Description: Selective/Open Wound Instrument: Curette Bleeding: Minimum Hemostasis Achieved: Pressure End Time: 09:59 Procedural Pain: 0 Post Procedural Pain: 0 Response to Treatment: Procedure was tolerated well Level of Consciousness (Post- Awake and Alert procedure): Post Debridement Measurements of Total Wound Length: (cm) 1.6 Width: (cm) 1.4 Depth: (cm) 0.1 Volume: (cm) 0.176 Character of Wound/Ulcer Post Debridement: Improved Severity of Tissue Post Debridement: Fat layer exposed Post Procedure Diagnosis Same as Pre-procedure Notes Scribed for Dr. Celine Ahr by Stacie Acres, [place] ([id number]) [phone].pdf Page 2 of 11 Electronic Signature(s) Signed: 05/25/2022 10:49:23 AM By: [name] Signed: 05/25/2022 5:29:45 PM By: Dellie Catholic RN Entered By: Dellie Catholic on 05/25/2022 10:02:47 -------------------------------------------------------------------------------- Debridement Details Patient Name: Date of Service: [name], [name] C. 05/25/2022 9:30 A M Medical Record Number: [id number] Patient Account Number: [id number] Date of Birth/Sex: Treating RN: 15-Feb-1951 (72 y.o. [name] Primary Care Provider: [name] Other Clinician: Referring Provider: Treating Provider/Extender: [name] in Treatment: 24 Debridement Performed for Assessment: Wound #4 Right,Medial Upper Leg Performed By: Physician Fredirick Maudlin, MD Debridement Type: Debridement Level of Consciousness (Pre-procedure): Awake and Alert Pre-procedure Verification/Time Out Yes - 09:58 Taken: Start Time: 09:58 Pain Control: Lidocaine 5% topical ointment T Area Debrided (L x W): otal 1.9 (cm) x 1.3 (cm) = 2.47 (cm) Tissue and other material debrided: Non-Viable, Slough, Subcutaneous, Slough Level: Skin/Subcutaneous Tissue Debridement Description: Excisional Instrument:  Curette Bleeding: Minimum Hemostasis Achieved: Pressure End Time: 09:59 Procedural Pain: 0 Post Procedural Pain: 0 Response to Treatment: Procedure was tolerated well Level of Consciousness (Post- Awake and Alert procedure): Post Debridement Measurements of Total Wound Length: (cm) 1.9 Width: (cm) 1.3 Depth: (cm) 0.1 Volume: (cm) 0.194 Character of Wound/Ulcer Post Debridement: Improved Post Procedure Diagnosis Same as Pre-procedure Notes Scribed for Dr. Celine Ahr by J.S. Electronic Signature(s) Signed: 05/25/2022 10:49:23 AM By: [name] Signed: 05/25/2022 5:29:45 PM By: Dellie Catholic RN Entered By: Minus Liberty  Mechele Claude on 05/25/2022 10:03:50 -------------------------------------------------------------------------------- HPI Details Patient Name: Date of Service: HO FFMA [name], [name] 05/25/2022 9:30 A M Medical Record Number: [id number] Patient Account Number: [id number] Date of Birth/Sex: Treating RN: 1950-04-05 (72 y.o. M) Primary Care Provider: [name] Other Clinician: Referring Provider: Treating Provider/Extender: [name] in Treatment: 24 History of Present Illness Location: right lateral calf closer to the knee Quality: Patient reports experiencing a dull pain to affected area(s). Severity: Patient states wound(s) are getting worse. Duration: Patient has had the wound for > 7 months prior to seeking treatment at the wound center Timing: Pain in wound is constant (hurts all the time) Michael [name], Michael [name] ([id number]) [id number] Page 3 of 11 Context: The wound would happen gradually Modifying Factors: Patient wound(s)/ulcer(s) are worsening due to : no resolution and a white material at the base of the wound ssociated Signs and Symptoms: Patient reports having:no discharge or purulent material A HPI Description: 72 year old gentleman who has been referred to was from his PCP for a chronic  ulceration on his right lower extremity which she's had for several weeks. Past medical history significant for diabetes mellitus type 2, hypertension, hyperlipidemia, anxiety, obesity, peripheral vascular disease and chronic kidney disease. He has never been a smoker. Most recent lab work done at his PCPs office showed a glucose of 217 milligrams per deciliter which is consistent with hyperglycemia. In January 2017 recent hemoglobin A1c values noted to be 8.2%. In April 2017, he has been seen at the vascular office by Dr. Trula Slade and Dr. Kellie Simmering for right leg claudication. He had a right superficial femoral artery stent in September 2012. Recent noninvasive vascular imaging done on 06/24/2015 showed a right ABI of 1.07 with a triphasic waveform and a right TBI of 0.81. The left was noncompressible with a triphasic waveform and a TBI of 1.17. Right lower extremity arterial duplex was unable to identify stent exit but they were elevated velocities at the stent and in the distal thigh with triphasic waveforms. Dr. Stephens Shire opinion was to return to the clinic in 6 months with ABI and right lower extremity arterial duplex studies to be done. He has seen a dermatologist who had done a biopsy and said it was benign and he was given a steroid cream. 10/23/2015 -- Pathology report of the biopsy done in April 2017 shows ulcer with underlying angiodermatitis consistent with stasis dermatitis. 10/30/2015 -- he is going for shoulder surgery in about 10 days' time and was asking about the perioperative care. His blood sugars are running in the high 100s or low 200s. No hemoglobin A1c done recently. 11/20/2015 -- is back up to 2 weeks because of recent left shoulder surgery. 12/04/15; patient returns today with the wound for the most part looking healthy. No evidence of infection no debridement required. He is using Prisma for 4 weeks, without obvious improvement per her intake nurse. This started as several small  open areas that were raised erythematous. He saw dermatology and at some point this was biopsied that just suggested stasis skin physiology. This certainly doesn't look like that 12/11/15; using Hydrafera Blue. Previous biopsy reviewed, no atypia PAS negative. He has a history of stents in the right leg however he does not appear to have primary arterial insufficiency ABI in this clinic was over 0.9. 12/25/2015 -- he has been approved for grafix and we will order for some to be applied next week 01/01/2016 -- he has had his first application of Grafix today 01/08/2016 --  he has had his second application of Grafix today READMISSION 12/04/2021 This is a now 72 year old man who was followed in our clinic about 5 years ago for an ulceration on his right lateral lower leg, just distal to the knee. He has since undergone a number of revascularization procedures that have been complicated by restenosis and wound infections. During the course of this treatment, he developed a small ulcer on his right anterior tibial surface. Despite use of an Unna boot, the wound has continued to expand. He was referred to the wound care center by Dr. Trula Slade for further evaluation and management. On the right anterior tibial surface, there is an irregular wound with heavy slough and eschar accumulation. The periwound skin is intact but he does have 1-2+ pitting edema. There is no purulent drainage or malodor. 10/13; second visit for this man who has an ischemic wound in the setting of type 2 diabetes on the right anterior mid tibia area. He has been revascularized. Using Santyl gent 12/22/2021: The wound is about the same size this week. There is a lot of gray sloughy material on the surface, secondary to silver nitrate used to stop bleeding after what sounds like a fairly aggressive debridement last week. 12/30/2021: The wound is smaller this week and a bit cleaner. Edema control is excellent. There is still a bit of  slough accumulation. 01/07/2022: The wound continues to contract and is much cleaner this week. Edema control is very good. He has small openings in his upper leg surgical scars, but he is going to be seeing Dr. Trula Slade next Monday and will have him take a look. He has been applying manuka honey to both of the sites. We have been using Santyl and gentamicin on his lower leg wound. 01/14/2022: He saw vascular surgery this week and was told that they were happy to have [place] manage the 2 wounds from his operation. Both of these have a light layer of slough on the surface. The more distal wound is a little bit dry. The anterior tibial wound continues to accumulate a fair amount of slough. Edema control is good. 01/19/2022: Both upper leg wounds are smaller. The more distal is quite dry and cracked when I was examining it. The lower leg wound is more painful and the surrounding tissue is erythematous and indurated. 01/26/2022: The upper leg wounds are healed. The skin at the distal leg wound is quite dry. The lower leg wound is cleaner but still fairly painful. His wrap slid again this week. 02/02/2022: We had good success keeping his wrap intact using a standard Unna boot. The lower leg wound continues to contract and is clean and less painful this week. 12/11; TheraSkin #1 12/18; TheraSkin #2. Wound looks as though it is contracted. Patient states that his pain was better in the leg. Secondary dressing and kerlix Coban 02/24/2022: The wound is smaller since I saw it last. It is less painful and there is good granulation tissue, including over the exposed tendon. 03/09/2022: The wound continues to contract. There is continued fill of the wound cavity with granulation tissue. 03/16/2022: The wound is smaller again today. There is good granulation tissue forming, covering the tendon. 03/30/2022: The wound continues to contract and fill with good granulation tissue. The granulation tissue is oozing a little bit  at the lateral edge. 04/13/2022: We left his TheraSkin on an extra week because it was looking so good. The wound is smaller this week and flush with the surrounding skin. His  medial thigh wound has opened up again. There is slough and eschar present. 04/20/2022: The anterior tibial wound continues to contract. There is good granulation tissue with just a light layer of slough. The medial thigh ulcer is superficial with slough present. It is a bit fibrotic due to being in a scar. 04/27/2022: The anterior tibial wound continues to contract. There is still some slough buildup with good granulation tissue. The medial thigh ulcer is very leathery and dry. 05/04/2022: The anterior tibial wound is even smaller today. There is no visible tendon at all. He has some slough and eschar accumulation. The medial thigh ulcer continues to be quite dry with a leathery surface. Michael [name], Michael [name] ([id number]) [id number] Page 4 of 11 05/11/2022: The anterior tibial wound continues to contract. There is a little bit of eschar and slough buildup. The granulation tissue is also a bit hypertrophic. The medial thigh ulcer is dry and leathery, once again. 05/25/2022: The anterior tibial wound is quite a bit smaller this week. There is a little bit of eschar and slough. No reaccumulation of hypertrophic granulation tissue. The medial thigh ulcer has slightly improved tissue quality. It is still somewhat fibrotic, but there is at least a light pink color to the tissue. Electronic Signature(s) Signed: 05/25/2022 10:25:53 AM By: [name] Entered By: Fredirick Maudlin on 05/25/2022 10:25:53 -------------------------------------------------------------------------------- Physical Exam Details Patient Name: Date of Service: [name], [name] 05/25/2022 9:30 A M Medical Record Number: [id number] Patient Account Number: [id number] Date of Birth/Sex: Treating RN: [birth year] (72 y.o.  M) Primary Care Provider: [name] Other Clinician: Referring Provider: Treating Provider/Extender: [name] in Treatment: 24 Constitutional . . . . no acute distress. Respiratory Normal work of breathing on room air. Notes 05/25/2022: The anterior tibial wound is quite a bit smaller this week. There is a little bit of eschar and slough. No reaccumulation of hypertrophic granulation tissue. The medial thigh ulcer has slightly improved tissue quality. It is still somewhat fibrotic, but there is at least a light pink color to the tissue. Electronic Signature(s) Signed: 05/25/2022 10:26:20 AM By: [name] Entered By: Fredirick Maudlin on 05/25/2022 10:26:19 -------------------------------------------------------------------------------- Physician Orders Details Patient Name: Date of Service: [name], [place] C. 05/25/2022 9:30 A M Medical Record Number: [id number] Patient Account Number: [id number] Date of Birth/Sex: Treating RN: [birth year] (72 y.o. [name] Primary Care Provider: [name] Other Clinician: Referring Provider: Treating Provider/Extender: [name] in Treatment: 24 Verbal / Phone Orders: No Diagnosis Coding ICD-10 Coding Code Description [phone] 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 I70.239 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 E11.622 Type 2 diabetes mellitus with other skin ulcer I73.9 Peripheral vascular disease, unspecified L97.122 Non-pressure chronic ulcer of left thigh with fat layer exposed Follow-up Appointments ppointment in 1 week. - Dr. Celine Ahr - Room 3 Return A Anesthetic Wound #3 Right,Anterior Lower Leg (In clinic) Topical Lidocaine 4% applied to wound bed Edema Control - Lymphedema / SCD / Other Right Lower Extremity Avoid standing for  long periods of time. Additional Orders / Instructions Other: - Doctor's note to return back to work 05/04/22 Michael [name], Michael [name] ([id number]) [phone].pdf Page 5 of 11 Wound Treatment Wound #3 - Lower Leg Wound Laterality: Right, Anterior Cleanser: Soap and Water 1 x Per Week/30 Days Discharge Instructions: May shower and wash wound with dial  antibacterial soap and water prior to dressing change. Cleanser: Wound Cleanser 1 x Per Week/30 Days Discharge Instructions: Cleanse the wound with wound cleanser prior to applying a clean dressing using gauze sponges, not tissue or cotton balls. Peri-Wound Care: Sween Lotion (Moisturizing lotion) 1 x Per Week/30 Days Discharge Instructions: Apply moisturizing lotion as directed Prim Dressing: Endoform 2x2 in 1 x Per Week/30 Days ary Discharge Instructions: Moisten with saline Secondary Dressing: ABD Pad, 8x10 1 x Per Week/30 Days Discharge Instructions: Apply over primary dressing as directed. Secondary Dressing: Woven Gauze Sponge, Non-Sterile 4x4 in 1 x Per Week/30 Days Discharge Instructions: Apply over primary dressing as directed. Secured With: [employer] Size 5, 10 (yds) 1 x Per Week/30 Days Compression Wrap: Kerlix Roll 4.5x3.1 (in/yd) 1 x Per Week/30 Days Discharge Instructions: Apply Kerlix and Coban compression as directed. Compression Wrap: Coban Self-Adherent Wrap 4x5 (in/yd) 1 x Per Week/30 Days Discharge Instructions: Apply over Kerlix as directed. Compression Wrap: Unnaboot w/Calamine, 4x10 (in/yd) 1 x Per Week/30 Days Discharge Instructions: Apply Unnaboot to top of dressing and around ankles to prevent dressing from sliding Wound #4 - Upper Leg Wound Laterality: Right, Medial Cleanser: Soap and Water 1 x Per Week/30 Days Discharge Instructions: May shower and wash wound with dial antibacterial soap and water prior to dressing change. Cleanser: Wound Cleanser 1 x Per Week/30 Days Discharge Instructions:  Cleanse the wound with wound cleanser prior to applying a clean dressing using gauze sponges, not tissue or cotton balls. Prim Dressing: Endoform 2x2 in 1 x Per Week/30 Days ary Discharge Instructions: Moisten with hydrogel Secondary Dressing: Zetuvit Plus Silicone Border Dressing 4x4 (in/in) 1 x Per Week/30 Days Discharge Instructions: Apply silicone border over primary dressing as directed. Electronic Signature(s) Signed: 05/25/2022 10:49:23 AM By: [name] Entered By: Fredirick Maudlin on 05/25/2022 10:26:31 -------------------------------------------------------------------------------- Problem List Details Patient Name: Date of Service: [name], [place] C. 05/25/2022 9:30 A M Medical Record Number: [id number] Patient Account Number: [id number] Date of Birth/Sex: Treating RN: [birth year] (72 y.o. M) Primary Care Provider: [name] Other Clinician: Referring Provider: Treating Provider/Extender: [name] in Treatment: 24 Active Problems ICD-10 Encounter Code Description Active Date MDM Diagnosis L97.815 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 12/04/2021 No Yes involvement without evidence of necrosis I70.239 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 12/04/2021 No Yes Munger, Bowman C ([id number]) [phone].pdf Page 6 of 11 E11.622 Type 2 diabetes mellitus with other skin ulcer 12/04/2021 No Yes I73.9 Peripheral vascular disease, unspecified 12/04/2021 No Yes L97.122 Non-pressure chronic ulcer of left thigh with fat layer exposed 01/14/2022 No Yes Inactive Problems Resolved Problems Electronic Signature(s) Signed: 05/25/2022 10:24:46 AM By: [name] Entered By: Fredirick Maudlin on 05/25/2022 10:24:46 -------------------------------------------------------------------------------- Progress Note Details Patient Name: Date  of Service: [name], [name] 05/25/2022 9:30 A M Medical Record Number: [id number] Patient Account Number: [id number] Date of Birth/Sex: Treating RN: [birth year] (72 y.o. M) Primary Care Provider: [name] Other Clinician: Referring Provider: Treating Provider/Extender: [name] in Treatment: 24 Subjective Chief Complaint Information obtained from Patient Patient presents to the wound care center today with an open arterial ulcer to the right lower extremity in the setting of diabetes mellitus. he has had this problem for 7 months 12/04/2021: ulcer to right lower anterior tibial surface History of Present Illness (HPI) The following HPI elements were documented for the patient's wound: Location:  right lateral calf closer to the knee Quality: Patient reports experiencing a dull pain to affected area(s). Severity: Patient states wound(s) are getting worse. Duration: Patient has had the wound for > 7 months prior to seeking treatment at the wound center Timing: Pain in wound is constant (hurts all the time) Context: The wound would happen gradually Modifying Factors: Patient wound(s)/ulcer(s) are worsening due to : no resolution and a white material at the base of the wound Associated Signs and Symptoms: Patient reports having:no discharge or purulent material 72 year old gentleman who has been referred to was from his PCP for a chronic ulceration on his right lower extremity which she's had for several weeks. Past medical history significant for diabetes mellitus type 2, hypertension, hyperlipidemia, anxiety, obesity, peripheral vascular disease and chronic kidney disease. He has never been a smoker. Most recent lab work done at his PCPs office showed a glucose of 217 milligrams per deciliter which is consistent with hyperglycemia. In January 2017 recent hemoglobin A1c values noted to be 8.2%. In April 2017, he has been seen at the vascular office by Dr.  Trula Slade and Dr. Kellie Simmering for right leg claudication. He had a right superficial femoral artery stent in September 2012. Recent noninvasive vascular imaging done on 06/24/2015 showed a right ABI of 1.07 with a triphasic waveform and a right TBI of 0.81. The left was noncompressible with a triphasic waveform and a TBI of 1.17. Right lower extremity arterial duplex was unable to identify stent exit but they were elevated velocities at the stent and in the distal thigh with triphasic waveforms. Dr. Stephens Shire opinion was to return to the clinic in 6 months with ABI and right lower extremity arterial duplex studies to be done. He has seen a dermatologist who had done a biopsy and said it was benign and he was given a steroid cream. 10/23/2015 -- Pathology report of the biopsy done in April 2017 shows ulcer with underlying angiodermatitis consistent with stasis dermatitis. 10/30/2015 -- he is going for shoulder surgery in about 10 days' time and was asking about the perioperative care. His blood sugars are running in the high 100s or low 200s. No hemoglobin A1c done recently. 11/20/2015 -- is back up to 2 weeks because of recent left shoulder surgery. 12/04/15; patient returns today with the wound for the most part looking healthy. No evidence of infection no debridement required. He is using Prisma for 4 weeks, without obvious improvement per her intake nurse. This started as several small open areas that were raised erythematous. He saw dermatology and at some point this was biopsied that just suggested stasis skin physiology. This certainly doesn't look like that 12/11/15; using Hydrafera Blue. Previous biopsy reviewed, no atypia PAS negative. He has a history of stents in the right leg however he does not appear to have primary arterial insufficiency ABI in this clinic was over 0.9. 12/25/2015 -- he has been approved for grafix and we will order for some to be applied next week 01/01/2016 -- he has had his  first application of Grafix today 01/08/2016 -- he has had his second application of Grafix today READMISSION 12/04/2021 Michael [name], Michael [name] ([id number]) [phone].pdf Page 7 of 11 This is a now 72 year old man who was followed in our clinic about 5 years ago for an ulceration on his right lateral lower leg, just distal to the knee. He has since undergone a number of revascularization procedures that have been complicated by restenosis and wound infections. During the course of this treatment,  he developed a small ulcer on his right anterior tibial surface. Despite use of an Unna boot, the wound has continued to expand. He was referred to the wound care center by Dr. Trula Slade for further evaluation and management. On the right anterior tibial surface, there is an irregular wound with heavy slough and eschar accumulation. The periwound skin is intact but he does have 1-2+ pitting edema. There is no purulent drainage or malodor. 10/13; second visit for this man who has an ischemic wound in the setting of type 2 diabetes on the right anterior mid tibia area. He has been revascularized. Using Santyl gent 12/22/2021: The wound is about the same size this week. There is a lot of gray sloughy material on the surface, secondary to silver nitrate used to stop bleeding after what sounds like a fairly aggressive debridement last week. 12/30/2021: The wound is smaller this week and a bit cleaner. Edema control is excellent. There is still a bit of slough accumulation. 01/07/2022: The wound continues to contract and is much cleaner this week. Edema control is very good. He has small openings in his upper leg surgical scars, but he is going to be seeing Dr. Trula Slade next Monday and will have him take a look. He has been applying manuka honey to both of the sites. We have been using Santyl and gentamicin on his lower leg wound. 01/14/2022: He saw vascular surgery this week and was told that  they were happy to have [place] manage the 2 wounds from his operation. Both of these have a light layer of slough on the surface. The more distal wound is a little bit dry. The anterior tibial wound continues to accumulate a fair amount of slough. Edema control is good. 01/19/2022: Both upper leg wounds are smaller. The more distal is quite dry and cracked when I was examining it. The lower leg wound is more painful and the surrounding tissue is erythematous and indurated. 01/26/2022: The upper leg wounds are healed. The skin at the distal leg wound is quite dry. The lower leg wound is cleaner but still fairly painful. His wrap slid again this week. 02/02/2022: We had good success keeping his wrap intact using a standard Unna boot. The lower leg wound continues to contract and is clean and less painful this week. 12/11; TheraSkin #1 12/18; TheraSkin #2. Wound looks as though it is contracted. Patient states that his pain was better in the leg. Secondary dressing and kerlix Coban 02/24/2022: The wound is smaller since I saw it last. It is less painful and there is good granulation tissue, including over the exposed tendon. 03/09/2022: The wound continues to contract. There is continued fill of the wound cavity with granulation tissue. 03/16/2022: The wound is smaller again today. There is good granulation tissue forming, covering the tendon. 03/30/2022: The wound continues to contract and fill with good granulation tissue. The granulation tissue is oozing a little bit at the lateral edge. 04/13/2022: We left his TheraSkin on an extra week because it was looking so good. The wound is smaller this week and flush with the surrounding skin. His medial thigh wound has opened up again. There is slough and eschar present. 04/20/2022: The anterior tibial wound continues to contract. There is good granulation tissue with just a light layer of slough. The medial thigh ulcer is superficial with slough present. It is a  bit fibrotic due to being in a scar. 04/27/2022: The anterior tibial wound continues to contract. There is still some  slough buildup with good granulation tissue. The medial thigh ulcer is very leathery and dry. 05/04/2022: The anterior tibial wound is even smaller today. There is no visible tendon at all. He has some slough and eschar accumulation. The medial thigh ulcer continues to be quite dry with a leathery surface. 05/11/2022: The anterior tibial wound continues to contract. There is a little bit of eschar and slough buildup. The granulation tissue is also a bit hypertrophic. The medial thigh ulcer is dry and leathery, once again. 05/25/2022: The anterior tibial wound is quite a bit smaller this week. There is a little bit of eschar and slough. No reaccumulation of hypertrophic granulation tissue. The medial thigh ulcer has slightly improved tissue quality. It is still somewhat fibrotic, but there is at least a light pink color to the tissue. Patient History Information obtained from Patient. Family History Cancer - Siblings, Heart Disease - Father, Hypertension - Father,Mother, No family history of Hereditary Spherocytosis, Kidney Disease, Seizures, Stroke, Thyroid Problems, Tuberculosis. Social History Former smoker - smokeless tobacco, Marital Status - Married, Alcohol Use - Rarely - WINE, Drug Use - No History, Caffeine Use - Daily - COFFEE. Medical History Ear/Nose/Mouth/Throat Patient has history of Chronic sinus problems/congestion - seasonal allergies Cardiovascular Patient has history of Hypertension, Peripheral Arterial Disease - STENTS, Peripheral Venous Disease Endocrine Patient has history of Type II Diabetes - last A1c- 8.2 Hospitalization/Surgery History - left shoulder surgery. Medical A Surgical History Notes nd Constitutional Symptoms (General Health) obesity , h/o (R) leg claudication (right fem stent September 2012) Cardiovascular hyperlipidemia Endocrine Michael [name], Michael [name] ([id number]) [id number] Page 8 of 11 pt. on diet intentionally losing 20 pounds since December 2016 in an effort to improve A1C levels Integumentary (Skin) psoriatic arthritis Musculoskeletal bilat knee pain identified as an ortho issue psoriatic arthritis Oncologic Prostate cancer 2018 Psychiatric anxiety Objective Constitutional no acute distress. Vitals Time Taken: 9:36 AM, Height: 72 in, Weight: 208 lbs, BMI: 28.2, Temperature: 97.5 F, Pulse: 93 bpm, Respiratory Rate: 16 breaths/min, Blood Pressure: 129/69 mmHg, Capillary Blood Glucose: 91 mg/dl. Respiratory Normal work of breathing on room air. General Notes: 05/25/2022: The anterior tibial wound is quite a bit smaller this week. There is a little bit of eschar and slough. No reaccumulation of hypertrophic granulation tissue. The medial thigh ulcer has slightly improved tissue quality. It is still somewhat fibrotic, but there is at least a light pink color to the tissue. Integumentary (Hair, Skin) Wound #3 status is Open. Original cause of wound was Blister. The date acquired was: 09/30/2021. The wound has been in treatment 24 weeks. The wound is located on the Right,Anterior Lower Leg. The wound measures 1.6cm length x 1.4cm width x 0.1cm depth; 1.759cm^2 area and 0.176cm^3 volume. There is Fat Layer (Subcutaneous Tissue) exposed. There is no tunneling or undermining noted. There is a medium amount of serosanguineous drainage noted. The wound margin is distinct with the outline attached to the wound base. There is medium (34-66%) pink granulation within the wound bed. There is a medium (34-66%) amount of necrotic tissue within the wound bed including Adherent Slough. The periwound skin appearance exhibited: Scarring, Dry/Scaly, Rubor. Periwound temperature was noted as No Abnormality. Wound #4 status is Open. Original cause of wound was Gradually Appeared. The date acquired was: 01/05/2022. The wound  has been in treatment 19 weeks. The wound is located on the Right,Medial Upper Leg. The wound measures 1.9cm length x 1.3cm width x 0.1cm depth; 1.94cm^2 area and 0.194cm^3 volume. There is Fat Layer (  Subcutaneous Tissue) exposed. There is no tunneling or undermining noted. There is a medium amount of serosanguineous drainage noted. The wound margin is distinct with the outline attached to the wound base. There is large (67-100%) pink, pale granulation within the wound bed. There is a small (1- 33%) amount of necrotic tissue within the wound bed including Adherent Slough. The periwound skin appearance had no abnormalities noted for moisture. The periwound skin appearance had no abnormalities noted for color. The periwound skin appearance exhibited: Scarring. Periwound temperature was noted as No Abnormality. 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 There was a Selective/Open Wound Non-Viable Tissue Debridement with a total area of 2.24 sq cm performed by Fredirick Maudlin, MD. With the following instrument(s): Curette to remove Non-Viable tissue/material. Material removed includes Eschar and Slough and after achieving pain control using Lidocaine 5% topical ointment. No specimens were taken. A time out was conducted at 09:58, prior to the start of the procedure. A Minimum amount of bleeding was controlled with Pressure. The procedure was tolerated well with a  pain level of 0 throughout and a pain level of 0 following the procedure. Post Debridement Measurements: 1.6cm length x 1.4cm width x 0.1cm depth; 0.176cm^3 volume. Character of Wound/Ulcer Post Debridement is improved. Severity of Tissue Post Debridement is: Fat layer exposed. Post procedure Diagnosis Wound #3: Same as Pre-Procedure General Notes: Scribed for Dr. Celine Ahr by Lenna Sciara.S.. Wound #4 Pre-procedure diagnosis of Wound #4 is a Dehisced Wound located on the Right,Medial Upper Leg . There was a Excisional Skin/Subcutaneous Tissue Debridement with a total area of 2.47 sq cm performed by Fredirick Maudlin, MD. With the following instrument(s): Curette to remove Non-Viable tissue/material. Material removed includes Subcutaneous Tissue and Slough and after achieving pain control using Lidocaine 5% topical ointment. No LADARRYL, MCCUSKEY ([id number]) [id number] Page 9 of 11 specimens were taken. A time out was conducted at 09:58, prior to the start of the procedure. A Minimum amount of bleeding was controlled with Pressure. The procedure was tolerated well with a pain level of 0 throughout and a pain level of 0 following the procedure. Post Debridement Measurements: 1.9cm length x 1.3cm width x 0.1cm depth; 0.194cm^3 volume. Character of Wound/Ulcer Post Debridement is improved. Post procedure Diagnosis Wound #4: Same as Pre-Procedure General Notes: Scribed for Dr. Celine Ahr by Lenna Sciara.S.. Plan Follow-up Appointments: Return Appointment in 1 week. - Dr. Celine Ahr - Room 3 Anesthetic: Wound #3 Right,Anterior Lower Leg: (In clinic) Topical Lidocaine 4% applied to wound bed Edema Control - Lymphedema / SCD / Other: Avoid standing for long periods of time. Additional Orders / Instructions: Other: - Doctor's note to return back to work 05/04/22 WOUND #3: - Lower Leg Wound Laterality: Right, Anterior Cleanser: Soap and Water 1 x Per Week/30 Days Discharge Instructions: May shower and  wash wound with dial antibacterial soap and water prior to dressing change. Cleanser: Wound Cleanser 1 x Per Week/30 Days Discharge Instructions: Cleanse the wound with wound cleanser prior to applying a clean dressing using gauze sponges, not tissue or cotton balls.  Peri-Wound Care: Sween Lotion (Moisturizing lotion) 1 x Per Week/30 Days Discharge Instructions: Apply moisturizing lotion as directed Prim Dressing: Endoform 2x2 in 1 x Per Week/30 Days ary Discharge Instructions: Moisten with saline Secondary Dressing: ABD Pad, 8x10 1 x Per Week/30 Days Discharge Instructions: Apply over primary dressing as directed. Secondary Dressing: Woven Gauze Sponge, Non-Sterile 4x4 in 1 x Per Week/30 Days Discharge Instructions: Apply over primary dressing as directed. Secured With: [employer] Size 5, 10 (yds) 1 x Per Week/30 Days Com pression Wrap: Kerlix Roll 4.5x3.1 (in/yd) 1 x Per Week/30 Days Discharge Instructions: Apply Kerlix and Coban compression as directed. Com pression Wrap: Coban Self-Adherent Wrap 4x5 (in/yd) 1 x Per Week/30 Days Discharge Instructions: Apply over Kerlix as directed. Com pression Wrap: Unnaboot w/Calamine, 4x10 (in/yd) 1 x Per Week/30 Days Discharge Instructions: Apply Unnaboot to top of dressing and around ankles to prevent dressing from sliding WOUND #4: - Upper Leg Wound Laterality: Right, Medial Cleanser: Soap and Water 1 x Per Week/30 Days Discharge Instructions: May shower and wash wound with dial antibacterial soap and water prior to dressing change. Cleanser: Wound Cleanser 1 x Per Week/30 Days Discharge Instructions: Cleanse the wound with wound cleanser prior to applying a clean dressing using gauze sponges, not tissue or cotton balls. Prim Dressing: Endoform 2x2 in 1 x Per Week/30 Days ary Discharge Instructions: Moisten with hydrogel Secondary Dressing: Zetuvit Plus Silicone Border Dressing 4x4 (in/in) 1 x Per Week/30 Days Discharge Instructions: Apply  silicone border over primary dressing as directed. 05/25/2022: The anterior tibial wound is quite a bit smaller this week. There is a little bit of eschar and slough. No reaccumulation of hypertrophic granulation tissue. The medial thigh ulcer has slightly improved tissue quality. It is still somewhat fibrotic, but there is at least a light pink color to the tissue. I used a curette to debride slough and eschar from the anterior tibial ulcer and slough and subcutaneous tissue from the medial thigh ulcer. We will continue endoform to both sites. Continue to moisten the medial thigh site with hydrogel. Continue Kerlix and Coban wrap. Follow-up in 1 week. Electronic Signature(s) Signed: 05/25/2022 10:27:31 AM By: [name] Entered By: Fredirick Maudlin on 05/25/2022 10:27:30 -------------------------------------------------------------------------------- HxROS Details Patient Name: Date of Service: [name], [place] C. 05/25/2022 9:30 A M Medical Record Number: [id number] Patient Account Number: [id number] Date of Birth/Sex: Treating RN: January 01, 1951 (72 y.o. M) Primary Care Provider: [name] Other Clinician: Referring Provider: Treating Provider/Extender: [name] in Treatment: 24 Information Obtained From Patient Constitutional Symptoms (Balta) Michael [name], Michael [name] ([id number]) [id number] Page 10 of 11 Medical History: Past Medical History Notes: obesity , h/o (R) leg claudication (right fem stent September 2012) Ear/Nose/Mouth/Throat Medical History: Positive for: Chronic sinus problems/congestion - seasonal allergies Cardiovascular Medical History: Positive for: Hypertension; Peripheral Arterial Disease - STENTS; Peripheral Venous Disease Past Medical History Notes: hyperlipidemia Endocrine Medical History: Positive for: Type II Diabetes - last A1c- 8.2 Past Medical History Notes: pt. on diet  intentionally losing 20 pounds since December 2016 in an effort to improve A1C levels Time with diabetes: 9 YRS Treated with: Insulin Blood sugar tested every day: Yes Tested : 7-8 TIMES Integumentary (Skin) Medical History: Past Medical History Notes: psoriatic arthritis Musculoskeletal Medical History: Past Medical History Notes: bilat knee pain identified as an ortho issue psoriatic arthritis Oncologic Medical History: Past Medical History Notes: Prostate cancer 2018 Psychiatric Medical History: Past Medical History Notes: anxiety HBO Extended History Items  Ear/Nose/Mouth/Throat: Chronic sinus problems/congestion Immunizations Pneumococcal Vaccine: Received Pneumococcal Vaccination: Yes Received Pneumococcal Vaccination On or After 60th Birthday: Yes Implantable Devices None Hospitalization / Surgery History Type of Hospitalization/Surgery left shoulder surgery Family and Social History Cancer: Yes - Siblings; Heart Disease: Yes - Father; Hereditary Spherocytosis: No; Hypertension: Yes - Father,Mother; Kidney Disease: No; Seizures: No; Stroke: No; Thyroid Problems: No; Tuberculosis: No; Former smoker - smokeless tobacco; Marital Status - Married; Alcohol Use: Rarely - WINE; Drug Use: No History; Caffeine Use: Daily - COFFEE; Financial Concerns: No; Food, Clothing or Shelter Needs: No; Support System Lacking: No; Transportation Concerns: No Electronic Signature(s) Signed: 05/25/2022 10:49:23 AM By: [name] Michael [name], Michael [name] [phone] By: [name] [id number] Page 11 of 11 Signed: 05/25/2022 10:49:23 Entered By: Fredirick Maudlin on 05/25/2022 10:25:59 -------------------------------------------------------------------------------- SuperBill Details Patient Name: Date of Service: [name], [name] 05/25/2022 Medical Record Number: [id number] Patient Account Number: [id number] Date of Birth/Sex: Treating  RN: 1950/09/03 (72 y.o. 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 E11.622 Type 2 diabetes mellitus with other skin ulcer I73.9 Peripheral vascular disease, unspecified L97.122 Non-pressure chronic ulcer of left thigh with fat layer exposed Facility Procedures : CPT4 Code Description: [id number] - DEB SUBQ TISSUE 20 SQ CM/< ICD-10 Diagnosis Description L97.122 Non-pressure chronic ulcer of left thigh with fat layer exposed Modifier: Quantity: 1 : CPT4 Code Description: [id number] - DEBRIDE WOUND 1ST 20 SQ CM OR < ICD-10 Diagnosis Description L97.815 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 involvemen Modifier: t without evidence Quantity: 1 of necrosis Physician Procedures : CPT4 Code Description Modifier [id number] - WC PHYS LEVEL 3 - EST PT 25 ICD-10 Diagnosis Description L97.815 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 without evidence L97.122 Non-pressure chronic ulcer of  left thigh with fat layer exposed E11.622 Type 2 diabetes mellitus with other skin ulcer I73.9 Peripheral vascular disease, unspecified Quantity: 1 of necrosis : [id number] - WC PHYS SUBQ TISS 20 SQ CM ICD-10 Diagnosis Description [id number] Non-pressure chronic ulcer of left thigh with fat layer exposed Quantity: 1 : [id number] - WC PHYS DEBR WO ANESTH 20 SQ CM ICD-10 Diagnosis Description L97.815 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 without evidence Quantity: 1 of necrosis Electronic Signature(s) Signed:  05/25/2022 10:27:56 AM By: [name] Entered By: Fredirick Maudlin on 05/25/2022 10:27:56

## 2022-05-26 NOTE — Progress Notes (Signed)
Michael, [name] ([id number]) [id number] Page 1 of 8 Visit Report for 05/25/2022 Arrival Information Details Patient Name: Date of Service: HO [place], [name] 05/25/2022 9:30 A M Medical Record Number: [id number] Patient Account Number: [id number] Date of Birth/Sex: Treating RN: [birth year] (72 y.o. Michael [name] Primary Care Diego Delancey: [name] Other Clinician: Referring Najae Rathert: Treating Magnum Lunde/Extender: [name] in Treatment: 24 Visit Information History Since Last Visit Added or deleted any medications: No Patient Arrived: Ambulatory Any new allergies or adverse reactions: No Arrival Time: 09:35 Had a fall or experienced change in No Accompanied By: spouse activities of daily living that may affect Transfer Assistance: Manual risk of falls: Patient Identification Verified: Yes Signs or symptoms of abuse/neglect since last visito No Patient Requires Transmission-Based Precautions: No Hospitalized since last visit: No Patient Has Alerts: Yes Implantable device outside of the clinic excluding No Patient Alerts: Patient on Blood Thinner cellular tissue based products placed in the center plavix since last visit: Has Dressing in Place as Prescribed: Yes Has Compression in Place as Prescribed: Yes Pain Present Now: No Electronic Signature(s) Signed: 05/25/2022 5:29:45 PM By: Dellie Catholic RN Entered By: Dellie Catholic on 05/25/2022 09:58:23 -------------------------------------------------------------------------------- Encounter Discharge Information Details Patient Name: Date of Service: [name], [place] C. 05/25/2022 9:30 A M Medical Record Number: [id number] Patient Account Number: [id number] Date of Birth/Sex: Treating RN: 04/22/1950 (72 y.o. Michael [name] Primary Care Deadrick Stidd: [name] Other Clinician: Referring Raylon Lamson: Treating Kallon Caylor/Extender: [name]  in Treatment: 24 Encounter Discharge Information Items Post Procedure Vitals Discharge Condition: Stable Temperature (F): 97.9 Ambulatory Status: Ambulatory Pulse (bpm): 99 Discharge Destination: Home Respiratory Rate (breaths/min): 20 Transportation: Ambulance Blood Pressure (mmHg): 181/104 Accompanied By: self Schedule Follow-up Appointment: Yes Clinical Summary of Care: Patient Declined Electronic Signature(s) Signed: 05/25/2022 5:29:45 PM By: Dellie Catholic RN Entered By: Dellie Catholic on 05/25/2022 17:29:22 -------------------------------------------------------------------------------- Lower Extremity Assessment Details Patient Name: Date of Service: [name], [name] 05/25/2022 9:30 A M Medical Record Number: [id number] Patient Account Number: [id number] Date of Birth/Sex: Treating RN: 1950-11-02 (72 y.o. Michael [name] Primary Care Addylynn Balin: [name] Other Clinician: Referring Kathie Posa: Treating Rashell Shambaugh/Extender: [name] in Treatment: 24 Edema Assessment H[Left: Quay Burow [id number] Patrice [id number].pdf Page 2 of 8] Assessed: [Left: No] [Right: No] Edema: [Left: Ye] [Right: s] Calf Left: Right: Point of Measurement: 35 cm From Medial Instep 34 cm Ankle Left: Right: Point of Measurement: 12 cm From Medial Instep 22.3 cm Vascular Assessment Pulses: Dorsalis Pedis Palpable: [Right:Yes] Electronic Signature(s) Signed: 05/25/2022 5:29:45 PM By: Dellie Catholic RN Entered By: Dellie Catholic on 05/25/2022 10:00:14 -------------------------------------------------------------------------------- Multi Wound Chart Details Patient Name: Date of Service: [name], [name] 05/25/2022 9:30 A M Medical Record Number: [id number] Patient Account Number: [id number] Date of Birth/Sex: Treating RN: [birth year] (72 y.o. M) Primary Care Jac Romulus: [name] Other Clinician: Referring  Kamaury Cutbirth: Treating Kyre Jeffries/Extender: [name] in Treatment: 24 Vital Signs Height(in): 72 Capillary Blood Glucose(mg/dl): 91 Weight(lbs): 208 Pulse(bpm): 24 Body Mass Index(BMI): 28.2 Blood Pressure(mmHg): 129/69 Temperature(F): 97.5 Respiratory Rate(breaths/min): 16 [3:Photos:] [N/A:N/A] Right, Anterior Lower Leg Right, Medial Upper Leg N/A Wound Location: Blister Gradually Appeared N/A Wounding Event: Diabetic Wound/Ulcer of the Lower Dehisced Wound N/A Primary Etiology: Extremity Chronic sinus problems/congestion, Chronic sinus problems/congestion, N/A Comorbid History: Hypertension, Peripheral Arterial Hypertension, Peripheral Arterial Disease, Peripheral Venous Disease, Disease, Peripheral Venous Disease, Type II Diabetes Type II Diabetes 09/30/2021 01/05/2022  N/A Date Acquired: 13 19 N/A Weeks of Treatment: Open Open N/A Wound Status: No No N/A Wound Recurrence: 1.6x1.4x0.1 1.9x1.3x0.1 N/A Measurements L x W x D (cm) 1.759 1.94 N/A A (cm) : rea 0.176 0.194 N/A Volume (cm) : 90.00% N/A N/A % Reduction in Area: 96.70% N/A N/A % Reduction in Volume: Grade 2 Full Thickness Without Exposed N/A Classification: Support Structures Medium Medium N/A Exudate Amount: Serosanguineous Serosanguineous N/A Exudate Type: red, brown red, brown N/A Exudate Color: Distinct, outline attached Distinct, outline attached N/A Wound Margin: Medium (34-66%) Large (67-100%) N/A Granulation Amount: JERREL, KETCHERSIDE ([id number]) [phone].pdf Page 3 of 8 Pink Pink, Pale N/A Granulation Quality: Medium (34-66%) Small (1-33%) N/A Necrotic Amount: Fat Layer (Subcutaneous Tissue): Yes Fat Layer (Subcutaneous Tissue): Yes N/A Exposed Structures: Fascia: No Fascia: No Tendon: No Tendon: No Muscle: No Muscle: No Joint: No Joint: No Bone: No Bone: No Small (1-33%) Small (1-33%) N/A Epithelialization: Debridement -  Selective/Open Wound Debridement - Excisional N/A Debridement: Pre-procedure Verification/Time Out 09:58 09:58 N/A Taken: Lidocaine 5% topical ointment Lidocaine 5% topical ointment N/A Pain Control: Necrotic/Eschar, Slough Subcutaneous, Slough N/A Tissue Debrided: Non-Viable Tissue Skin/Subcutaneous Tissue N/A Level: 2.24 2.47 N/A Debridement A (sq cm): rea Curette Curette N/A Instrument: Minimum Minimum N/A Bleeding: Pressure Pressure N/A Hemostasis A chieved: 0 0 N/A Procedural Pain: 0 0 N/A Post Procedural Pain: Procedure was tolerated well Procedure was tolerated well N/A Debridement Treatment Response: 1.6x1.4x0.1 1.9x1.3x0.1 N/A Post Debridement Measurements L x W x D (cm) 0.176 0.194 N/A Post Debridement Volume: (cm) Scarring: Yes Scarring: Yes N/A Periwound Skin Texture: Dry/Scaly: Yes No Abnormalities Noted N/A Periwound Skin Moisture: Rubor: Yes No Abnormalities Noted N/A Periwound Skin Color: No Abnormality No Abnormality N/A Temperature: Debridement Debridement N/A Procedures Performed: Treatment Notes Electronic Signature(s) Signed: 05/25/2022 10:24:52 AM By: [name] Entered By: Fredirick Maudlin on 05/25/2022 10:24:52 -------------------------------------------------------------------------------- Multi-Disciplinary Care Plan Details Patient Name: Date of Service: [name], [name] 05/25/2022 9:30 A M Medical Record Number: [id number] Patient Account Number: [id number] Date of Birth/Sex: Treating RN: 1950/05/07 (72 y.o. Michael [name] Primary Care Imad Shostak: [name] Other Clinician: Referring Jelan Batterton: Treating Zenaida Tesar/Extender: [name] in Treatment: 24 Active Inactive Nutrition Nursing Diagnoses: Potential for alteratiion in Nutrition/Potential for imbalanced nutrition Goals: Patient/caregiver agrees to and verbalizes understanding of need to use nutritional supplements and/or  vitamins as prescribed Date Initiated: 12/04/2021 Target Resolution Date: 10/29/2022 Goal Status: Active Patient/caregiver verbalizes understanding of need to maintain therapeutic glucose control per primary care physician Date Initiated: 12/04/2021 Target Resolution Date: 10/29/2022 Goal Status: Active Interventions: Assess patient nutrition upon admission and as needed per policy Provide education on elevated blood sugars and impact on wound healing Provide education on nutrition Treatment Activities: Education provided on Nutrition : 01/26/2022 Notes: Wound/Skin Impairment LEANDRE, HESSMAN ([id number]) [phone].pdf Page 4 of 8 Nursing Diagnoses: Impaired tissue integrity Knowledge deficit related to ulceration/compromised skin integrity Goals: Patient/caregiver will verbalize understanding of skin care regimen Date Initiated: 12/04/2021 Date Inactivated: 02/02/2022 Target Resolution Date: 01/28/2022 Goal Status: Met Ulcer/skin breakdown will have a volume reduction of 30% by week 4 Date Initiated: 12/04/2021 Date Inactivated: 01/07/2022 Target Resolution Date: 12/26/2021 Goal Status: Met Ulcer/skin breakdown will have a volume reduction of 50% by week 8 Date Initiated: 01/07/2022 Date Inactivated: 03/16/2022 Target Resolution Date: 04/01/2022 Goal Status: Met Ulcer/skin breakdown will have a volume reduction of 80% by week 12 Date Initiated: 03/16/2022 Target Resolution Date: 10/29/2022 Goal Status: Active Interventions: Assess ulceration(s)  every visit Provide education on ulcer and skin care Notes: Electronic Signature(s) Signed: 05/25/2022 5:29:45 PM By: Dellie Catholic RN Entered By: Dellie Catholic on 05/25/2022 17:27:57 -------------------------------------------------------------------------------- Pain Assessment Details Patient Name: Date of Service: [name], [name] 05/25/2022 9:30 A M Medical Record Number: [id number] Patient Account  Number: [id number] Date of Birth/Sex: Treating RN: 16-May-1950 (72 y.o. Michael [name] Primary Care Alieah Brinton: [name] Other Clinician: Referring Brendan Gadson: Treating Lizzete Gough/Extender: [name] in Treatment: 24 Active Problems Location of Pain Severity and Description of Pain Patient Has Paino No Site Locations Pain Management and Medication Current Pain Management: Electronic Signature(s) Signed: 05/25/2022 5:29:45 PM By: Dellie Catholic RN Entered By: Dellie Catholic on 05/25/2022 09:59:21 Casamento, Marjory Lies ([id number]) [id number] Page 5 of 8 -------------------------------------------------------------------------------- Patient/Caregiver Education Details Patient Name: Date of Service: [name], [name] 3/25/2024andnbsp9:30 [place] Record Number: [id number] Patient Account Number: [id number] Date of Birth/Gender: Treating RN: 10/05/1950 (72 y.o. Michael [name] Primary Care Physician: [name] Other Clinician: Referring Physician: Treating Physician/Extender: [name] in Treatment: 24 Education Assessment Education Provided To: Patient Education Topics Provided Wound/Skin Impairment: Methods: Explain/Verbal Responses: Return demonstration correctly Electronic Signature(s) Signed: 05/25/2022 5:29:45 PM By: Dellie Catholic RN Entered By: Dellie Catholic on 05/25/2022 17:28:26 -------------------------------------------------------------------------------- Wound Assessment Details Patient Name: Date of Service: [name], [place] C. 05/25/2022 9:30 A M Medical Record Number: [id number] Patient Account Number: [id number] Date of Birth/Sex: Treating RN: [birth year] (73 y.o. Michael [name] Primary Care Fowler Antos: [name] Other Clinician: Referring Nathanel Tallman: Treating Fadi Menter/Extender: [name] in Treatment: 24 Wound Status Wound  Number: 3 Primary Diabetic Wound/Ulcer of the Lower Extremity Etiology: Wound Location: Right, Anterior Lower Leg Wound Open Wounding Event: Blister Status: Date Acquired: 09/30/2021 Comorbid Chronic sinus problems/congestion, Hypertension, Peripheral Weeks Of Treatment: 24 History: Arterial Disease, Peripheral Venous Disease, Type II Diabetes Clustered Wound: No Photos Wound Measurements Length: (cm) 1.6 Width: (cm) 1.4 Depth: (cm) 0.1 Area: (cm) 1.759 Volume: (cm) 0.176 % Reduction in Area: 90% % Reduction in Volume: 96.7% Epithelialization: Small (1-33%) Tunneling: No Undermining: No Wound Description Classification: Grade 2 Wound Margin: Distinct, outline attached Exudate Amount: Medium Exudate Type: Serosanguineous Exudate Color: red, brown Schellinger, Shyler C ([id number]) Foul Odor After Cleansing: No Slough/Fibrino Yes [phone].pdf Page 6 of 8 Wound Bed Granulation Amount: Medium (34-66%) Exposed Structure Granulation Quality: Pink Fascia Exposed: No Necrotic Amount: Medium (34-66%) Fat Layer (Subcutaneous Tissue) Exposed: Yes Necrotic Quality: Adherent Slough Tendon Exposed: No Muscle Exposed: No Joint Exposed: No Bone Exposed: No Periwound Skin Texture Texture Color No Abnormalities Noted: No No Abnormalities Noted: No Scarring: Yes Rubor: Yes Moisture Temperature / Pain No Abnormalities Noted: No Temperature: No Abnormality Dry / Scaly: Yes Treatment Notes Wound #3 (Lower Leg) Wound Laterality: Right, Anterior Cleanser Soap and Water Discharge Instruction: May shower and wash wound with dial antibacterial soap and water prior to dressing change. Wound Cleanser Discharge Instruction: Cleanse the wound with wound cleanser prior to applying a clean dressing using gauze sponges, not tissue or cotton balls. Peri-Wound Care Sween Lotion (Moisturizing lotion) Discharge Instruction: Apply moisturizing lotion as  directed Topical Primary Dressing Endoform 2x2 in Discharge Instruction: Moisten with saline Secondary Dressing ABD Pad, 8x10 Discharge Instruction: Apply over primary dressing as directed. Woven Gauze Sponge, Non-Sterile 4x4 in Discharge Instruction: Apply over primary dressing as directed. Secured With [employer] Size 5, 10 (yds) Compression Wrap Kerlix Roll 4.5x3.1 (in/yd) Discharge Instruction: Apply Kerlix  and Coban compression as directed. Coban Self-Adherent Wrap 4x5 (in/yd) Discharge Instruction: Apply over Kerlix as directed. Unnaboot w/Calamine, 4x10 (in/yd) Discharge Instruction: Apply Unnaboot to top of dressing and around ankles to prevent dressing from sliding Compression Stockings Add-Ons Electronic Signature(s) Signed: 05/25/2022 5:29:45 PM By: Dellie Catholic RN Entered By: Dellie Catholic on 05/25/2022 09:50:42 -------------------------------------------------------------------------------- Wound Assessment Details Patient Name: Date of Service: [name], [name] 05/25/2022 9:30 A M Medical Record Number: [id number] Patient Account Number: [id number] Date of Birth/Sex: Treating RN: Jun 15, 1950 (72 y.o. Michael [name] Primary Care Safwan Tomei: [name] Other Clinician: Referring Naliya Gish: Treating Aamir Mclinden/Extender: [name] in Treatment: [address] [place] ([id number]) [id number] Page 7 of 8 Wound Status Wound Number: 4 Primary Dehisced Wound Etiology: Wound Location: Right, Medial Upper Leg Wound Open Wounding Event: Gradually Appeared Status: Date Acquired: 01/05/2022 Comorbid Chronic sinus problems/congestion, Hypertension, Peripheral Weeks Of Treatment: 19 History: Arterial Disease, Peripheral Venous Disease, Type II Diabetes Clustered Wound: No Photos Wound Measurements Length: (cm) 1.9 Width: (cm) 1.3 Depth: (cm) 0.1 Area: (cm) 1.94 Volume: (cm) 0.194 % Reduction in  Area: % Reduction in Volume: Epithelialization: Small (1-33%) Tunneling: No Undermining: No Wound Description Classification: Full Thickness Without Exposed Support Structures Wound Margin: Distinct, outline attached Exudate Amount: Medium Exudate Type: Serosanguineous Exudate Color: red, brown Foul Odor After Cleansing: No Slough/Fibrino Yes Wound Bed Granulation Amount: Large (67-100%) Exposed Structure Granulation Quality: Pink, Pale Fascia Exposed: No Necrotic Amount: Small (1-33%) Fat Layer (Subcutaneous Tissue) Exposed: Yes Necrotic Quality: Adherent Slough Tendon Exposed: No Muscle Exposed: No Joint Exposed: No Bone Exposed: No Periwound Skin Texture Texture Color No Abnormalities Noted: No No Abnormalities Noted: Yes Scarring: Yes Temperature / Pain Temperature: No Abnormality Moisture No Abnormalities Noted: Yes Treatment Notes Wound #4 (Upper Leg) Wound Laterality: Right, Medial Cleanser Soap and Water Discharge Instruction: May shower and wash wound with dial antibacterial soap and water prior to dressing change. Wound Cleanser Discharge Instruction: Cleanse the wound with wound cleanser prior to applying a clean dressing using gauze sponges, not tissue or cotton balls. Peri-Wound Care Topical Primary Dressing Endoform 2x2 in Discharge Instruction: Moisten with hydrogel Secondary Dressing Zetuvit Plus Silicone Border Dressing 4x4 (in/in) FABIEN, RENDA ([id number]) [id number] Page 8 of 8 Discharge Instruction: Apply silicone border over primary dressing as directed. Secured With Compression Wrap Compression Stockings Environmental education officer) Signed: 05/25/2022 5:29:45 PM By: Dellie Catholic RN Entered By: Dellie Catholic on 05/25/2022 09:51:16 -------------------------------------------------------------------------------- Vitals Details Patient Name: Date of Service: [name], [name] 05/25/2022 9:30 A  M Medical Record Number: [id number] Patient Account Number: [id number] Date of Birth/Sex: Treating RN: [birth year] (72 y.o. Michael [name] Primary Care Nihaal Friesen: [name] Other Clinician: Referring Jamiah Homeyer: Treating Yilia Sacca/Extender: [name] in Treatment: 24 Vital Signs Time Taken: 09:36 Temperature (F): 97.5 Height (in): 72 Pulse (bpm): 93 Weight (lbs): 208 Respiratory Rate (breaths/min): 16 Body Mass Index (BMI): 28.2 Blood Pressure (mmHg): 129/69 Capillary Blood Glucose (mg/dl): 91 Reference Range: 80 - 120 mg / dl Electronic Signature(s) Signed: 05/25/2022 5:29:45 PM By: Dellie Catholic RN Entered By: Dellie Catholic on 05/25/2022 09:59:12

## 2022-06-01 ENCOUNTER — Encounter (HOSPITAL_BASED_OUTPATIENT_CLINIC_OR_DEPARTMENT_OTHER): Payer: Medicare Other | Attending: General Surgery | Admitting: General Surgery

## 2022-06-01 DIAGNOSIS — S81801A Unspecified open wound, right lower leg, initial encounter: Secondary | ICD-10-CM | POA: Diagnosis not present

## 2022-06-01 DIAGNOSIS — L97815 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: Secondary | ICD-10-CM | POA: Insufficient documentation

## 2022-06-01 DIAGNOSIS — E11622 Type 2 diabetes mellitus with other skin ulcer: Secondary | ICD-10-CM | POA: Insufficient documentation

## 2022-06-01 DIAGNOSIS — L97812 Non-pressure chronic ulcer of other part of right lower leg with fat layer exposed: Secondary | ICD-10-CM | POA: Diagnosis not present

## 2022-06-01 DIAGNOSIS — E1151 Type 2 diabetes mellitus with diabetic peripheral angiopathy without gangrene: Secondary | ICD-10-CM | POA: Diagnosis not present

## 2022-06-01 DIAGNOSIS — I70239 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: Secondary | ICD-10-CM | POA: Insufficient documentation

## 2022-06-01 DIAGNOSIS — L97122 Non-pressure chronic ulcer of left thigh with fat layer exposed: Secondary | ICD-10-CM | POA: Diagnosis not present

## 2022-06-01 DIAGNOSIS — T8131XA Disruption of external operation (surgical) wound, not elsewhere classified, initial encounter: Secondary | ICD-10-CM | POA: Diagnosis not present

## 2022-06-01 DIAGNOSIS — S71101A Unspecified open wound, right thigh, initial encounter: Secondary | ICD-10-CM | POA: Diagnosis not present

## 2022-06-01 NOTE — Progress Notes (Addendum)
[name], [name] ([id number]) [id number] Page 1 of 10 Visit Report for 06/01/2022 Chief Complaint Document Details Patient Name: Date of Service: [name], [name] 06/01/2022 9:30 A M Medical Record Number: [id number] Patient Account Number: [id number] Date of Birth/Sex: Treating RN: [birth year] (72 y.o. M) Primary Care Provider: [name] Other Clinician: Referring Provider: Treating Provider/Extender: [name] in Treatment: 25 Information Obtained from: Patient Chief Complaint Patient presents to the wound care center today with an open arterial ulcer to the right lower extremity in the setting of diabetes mellitus. he has had this problem for 7 months 12/04/2021: ulcer to right lower anterior tibial surface Electronic Signature(s) Signed: 06/01/2022 9:55:42 AM By: [name] Entered By: Fredirick Maudlin on 06/01/2022 09:55:42 -------------------------------------------------------------------------------- HPI Details Patient Name: Date of Service: [name], [name] 06/01/2022 9:30 A M Medical Record Number: [id number] Patient Account Number: [id number] Date of Birth/Sex: Treating RN: 1950/06/10 (72 y.o. M) Primary Care Provider: [name] Other Clinician: Referring Provider: Treating Provider/Extender: [name] in Treatment: 25 History of Present Illness Location: right lateral calf closer to the knee Quality: Patient reports experiencing a dull pain to affected area(s). Severity: Patient states wound(s) are getting worse. Duration: Patient has had the wound for > 7 months prior to seeking treatment at the wound center Timing: Pain in wound is constant (hurts all the time) Context: The wound would happen gradually Modifying Factors: Patient wound(s)/ulcer(s) are worsening due to : no resolution and a white material at the base of the wound ssociated Signs and Symptoms:  Patient reports having:no discharge or purulent material A HPI Description: 72 year old gentleman who has been referred to was from his PCP for a chronic ulceration on his right lower extremity which she's had for several weeks. Past medical history significant for diabetes mellitus type 2, hypertension, hyperlipidemia, anxiety, obesity, peripheral vascular disease and chronic kidney disease. He has never been a smoker. Most recent lab work done at his PCPs office showed a glucose of 217 milligrams per deciliter which is consistent with hyperglycemia. In January 2017 recent hemoglobin A1c values noted to be 8.2%. In April 2017, he has been seen at the vascular office by Dr. Trula Slade and Dr. Kellie Simmering for right leg claudication. He had a right superficial femoral artery stent in September 2012. Recent noninvasive vascular imaging done on 06/24/2015 showed a right ABI of 1.07 with a triphasic waveform and a right TBI of 0.81. The left was noncompressible with a triphasic waveform and a TBI of 1.17. Right lower extremity arterial duplex was unable to identify stent exit but they were elevated velocities at the stent and in the distal thigh with triphasic waveforms. Dr. Stephens Shire opinion was to return to the clinic in 6 months with ABI and right lower extremity arterial duplex studies to be done. He has seen a dermatologist who had done a biopsy and said it was benign and he was given a steroid cream. 10/23/2015 -- Pathology report of the biopsy done in April 2017 shows ulcer with underlying angiodermatitis consistent with stasis dermatitis. 10/30/2015 -- he is going for shoulder surgery in about 10 days' time and was asking about the perioperative care. His blood sugars are running in the high 100s or low 200s. No hemoglobin A1c done recently. 11/20/2015 -- is back up to 2 weeks because of recent left shoulder surgery. 12/04/15; patient returns today with the wound for the most part looking healthy. No  evidence of infection  no debridement required. He is using Prisma for 4 weeks, without obvious improvement per her intake nurse. This started as several small open areas that were raised erythematous. He saw dermatology and at some point this was biopsied that just suggested stasis skin physiology. This certainly doesn't look like that 12/11/15; using Hydrafera Blue. Previous biopsy reviewed, no atypia PAS negative. He has a history of stents in the right leg however he does not appear to have primary arterial insufficiency ABI in this clinic was over 0.9. 12/25/2015 -- he has been approved for grafix and we will order for some to be applied next week 01/01/2016 -- he has had his first application of Grafix today 01/08/2016 -- he has had his second application of Grafix today READMISSION 12/04/2021 This is a now 72 year old man who was followed in our clinic about 5 years ago for an ulceration on his right lateral lower leg, just distal to the knee. He has since undergone a number of revascularization procedures that have been complicated by restenosis and wound infections. During the course of this treatment, he developed a small ulcer on his right anterior tibial surface. Despite use of an Unna boot, the wound has continued to expand. He was referred to Michael [name], Michael [name] ([id number]) [id number] Page 2 of 10 the wound care center by Dr. Trula Slade for further evaluation and management. On the right anterior tibial surface, there is an irregular wound with heavy slough and eschar accumulation. The periwound skin is intact but he does have 1-2+ pitting edema. There is no purulent drainage or malodor. 10/13; second visit for this man who has an ischemic wound in the setting of type 2 diabetes on the right anterior mid tibia area. He has been revascularized. Using Santyl gent 12/22/2021: The wound is about the same size this week. There is a lot of gray sloughy material on the  surface, secondary to silver nitrate used to stop bleeding after what sounds like a fairly aggressive debridement last week. 12/30/2021: The wound is smaller this week and a bit cleaner. Edema control is excellent. There is still a bit of slough accumulation. 01/07/2022: The wound continues to contract and is much cleaner this week. Edema control is very good. He has small openings in his upper leg surgical scars, but he is going to be seeing Dr. Trula Slade next Monday and will have him take a look. He has been applying manuka honey to both of the sites. We have been using Santyl and gentamicin on his lower leg wound. 01/14/2022: He saw vascular surgery this week and was told that they were happy to have [place] manage the 2 wounds from his operation. Both of these have a light layer of slough on the surface. The more distal wound is a little bit dry. The anterior tibial wound continues to accumulate a fair amount of slough. Edema control is good. 01/19/2022: Both upper leg wounds are smaller. The more distal is quite dry and cracked when I was examining it. The lower leg wound is more painful and the surrounding tissue is erythematous and indurated. 01/26/2022: The upper leg wounds are healed. The skin at the distal leg wound is quite dry. The lower leg wound is cleaner but still fairly painful. His wrap slid again this week. 02/02/2022: We had good success keeping his wrap intact using a standard Unna boot. The lower leg wound continues to contract and is clean and less painful this week. 12/11; TheraSkin #1 12/18; TheraSkin #2. Wound looks as though  it is contracted. Patient states that his pain was better in the leg. Secondary dressing and kerlix Coban 02/24/2022: The wound is smaller since I saw it last. It is less painful and there is good granulation tissue, including over the exposed tendon. 03/09/2022: The wound continues to contract. There is continued fill of the wound cavity with granulation  tissue. 03/16/2022: The wound is smaller again today. There is good granulation tissue forming, covering the tendon. 03/30/2022: The wound continues to contract and fill with good granulation tissue. The granulation tissue is oozing a little bit at the lateral edge. 04/13/2022: We left his TheraSkin on an extra week because it was looking so good. The wound is smaller this week and flush with the surrounding skin. His medial thigh wound has opened up again. There is slough and eschar present. 04/20/2022: The anterior tibial wound continues to contract. There is good granulation tissue with just a light layer of slough. The medial thigh ulcer is superficial with slough present. It is a bit fibrotic due to being in a scar. 04/27/2022: The anterior tibial wound continues to contract. There is still some slough buildup with good granulation tissue. The medial thigh ulcer is very leathery and dry. 05/04/2022: The anterior tibial wound is even smaller today. There is no visible tendon at all. He has some slough and eschar accumulation. The medial thigh ulcer continues to be quite dry with a leathery surface. 05/11/2022: The anterior tibial wound continues to contract. There is a little bit of eschar and slough buildup. The granulation tissue is also a bit hypertrophic. The medial thigh ulcer is dry and leathery, once again. 05/25/2022: The anterior tibial wound is quite a bit smaller this week. There is a little bit of eschar and slough. No reaccumulation of hypertrophic granulation tissue. The medial thigh ulcer has slightly improved tissue quality. It is still somewhat fibrotic, but there is at least a light pink color to the tissue. 06/01/2022: Both wounds are smaller. There is hypertrophic granulation tissue on the anterior tibial wound. The quality of the tissue continues to improve on the medial thigh ulcer. Electronic Signature(s) Signed: 06/01/2022 9:56:12 AM By: [name] Entered By: Fredirick Maudlin on 06/01/2022 09:56:12 -------------------------------------------------------------------------------- Chemical Cauterization Details Patient Name: Date of Service: [name], [place] C. 06/01/2022 9:30 A M Medical Record Number: [id number] Patient Account Number: [id number] Date of Birth/Sex: Treating RN: 1950-04-03 (72 y.o. [name] Primary Care Provider: [name] Other Clinician: Referring Provider: Treating Provider/Extender: [name] in Treatment: 25 Procedure Performed for: Wound #3 Right,Anterior Lower Leg Performed By: Physician Fredirick Maudlin, MD Post Procedure Diagnosis WILDON, LADUE ([id number]) [id number] Page 3 of 10 Same as Pre-procedure Electronic Signature(s) Signed: 06/01/2022 10:56:24 AM By: [name] Signed: 06/01/2022 12:10:10 PM By: Dellie Catholic RN Entered By: Dellie Catholic on 06/01/2022 09:54:00 -------------------------------------------------------------------------------- Physical Exam Details Patient Name: Date of Service: [name], [place] C. 06/01/2022 9:30 A M Medical Record Number: [id number] Patient Account Number: [id number] Date of Birth/Sex: Treating RN: August 22, 1950 (72 y.o. M) Primary Care Provider: [name] Other Clinician: Referring Provider: Treating Provider/Extender: [name] in Treatment: 25 Constitutional . . . . no acute distress. Respiratory Normal work of breathing on room air. Notes 06/01/2022: Both wounds are smaller. There is hypertrophic granulation tissue on the anterior tibial wound. The quality of the tissue continues to improve on the medial thigh ulcer. Electronic Signature(s) Signed: 06/01/2022 9:56:40 AM By:  [name] Entered By: Fredirick Maudlin on 06/01/2022 09:56:40 -------------------------------------------------------------------------------- Physician Orders  Details Patient Name: Date of Service: HO [place], [name] 06/01/2022 9:30 A M Medical Record Number: [id number] Patient Account Number: [id number] Date of Birth/Sex: Treating RN: October 02, 1950 (72 y.o. [name] Primary Care Provider: [name] Other Clinician: Referring Provider: Treating Provider/Extender: [name] in Treatment: 25 Verbal / Phone Orders: No Diagnosis Coding Follow-up Appointments ppointment in 1 week. - Dr. Celine Ahr - Room 3 Return A Anesthetic Wound #3 Right,Anterior Lower Leg (In clinic) Topical Lidocaine 4% applied to wound bed Edema Control - Lymphedema / SCD / Other Right Lower Extremity Avoid standing for long periods of time. Additional Orders / Instructions Other: - Doctor's note to return back to work 05/04/22 Wound Treatment Wound #3 - Lower Leg Wound Laterality: Right, Anterior Cleanser: Soap and Water 1 x Per Week/30 Days Discharge Instructions: May shower and wash wound with dial antibacterial soap and water prior to dressing change. Cleanser: Wound Cleanser (DME) (Generic) 1 x Per Week/30 Days Discharge Instructions: Cleanse the wound with wound cleanser prior to applying a clean dressing using gauze sponges, not tissue or cotton balls. Peri-Wound Care: Sween Lotion (Moisturizing lotion) 1 x Per Week/30 Days Discharge Instructions: Apply moisturizing lotion as directed COLBEY, KNOPP ([id number]) [id number].pdf Page 4 of 10 Topical: Skintegrity Hydrogel 4 (oz) (DME) (Generic) 1 x Per Week/30 Days Discharge Instructions: Apply hydrogel as directed Prim Dressing: Endoform 2x2 in (DME) (Generic) 1 x Per Week/30 Days ary Discharge Instructions: Moisten with hydrogel Secondary Dressing: Woven Gauze Sponge, Non-Sterile 4x4 in (DME) (Generic) 1 x Per Week/30 Days Discharge Instructions: Apply over primary dressing as directed. Secured With: [employer] Size 5, 10 (yds) (DME) (Generic) 1 x  Per Week/30 Days Compression Wrap: Kerlix Roll 4.5x3.1 (in/yd) (DME) (Generic) 1 x Per Week/30 Days Discharge Instructions: Apply Kerlix and Coban compression as directed. Compression Wrap: Coban Self-Adherent Wrap 4x5 (in/yd) (DME) (Generic) 1 x Per Week/30 Days Discharge Instructions: Apply over Kerlix as directed. Wound #4 - Upper Leg Wound Laterality: Right, Medial Cleanser: Soap and Water 1 x Per Day/30 Days Discharge Instructions: May shower and wash wound with dial antibacterial soap and water prior to dressing change. Cleanser: Wound Cleanser (DME) (Generic) 1 x Per Day/30 Days Discharge Instructions: Cleanse the wound with wound cleanser prior to applying a clean dressing using gauze sponges, not tissue or cotton balls. Topical: Skintegrity Hydrogel 4 (oz) (DME) (Generic) 1 x Per Day/30 Days Discharge Instructions: Apply hydrogel as directed Prim Dressing: Endoform 2x2 in (DME) (Generic) 1 x Per Day/30 Days ary Discharge Instructions: Moisten with hydrogel Secondary Dressing: Zetuvit Plus Silicone Border Dressing 4x4 (in/in) (DME) (Generic) 1 x Per Day/30 Days Discharge Instructions: Apply silicone border over primary dressing as directed. Electronic Signature(s) Signed: 06/01/2022 10:56:24 AM By: [name] Signed: 06/01/2022 12:10:10 PM By: Dellie Catholic RN Previous Signature: 06/01/2022 9:56:52 AM Version By: [name] Entered By: Dellie Catholic on 06/01/2022 09:57:52 -------------------------------------------------------------------------------- Problem List Details Patient Name: Date of Service: [name], [place] C. 06/01/2022 9:30 A M Medical Record Number: [id number] Patient Account Number: [id number] Date of Birth/Sex: Treating RN: 06-Jun-1950 (72 y.o. M) Primary Care Provider: [name] Other Clinician: Referring Provider: Treating Provider/Extender: [name] in Treatment: 25 Active  Problems ICD-10 Encounter Code Description Active Date MDM Diagnosis L97.815 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 12/04/2021 No Yes involvement  without evidence of necrosis I70.239 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 12/04/2021 No Yes E11.622 Type 2 diabetes mellitus with other skin ulcer 12/04/2021 No Yes I73.9 Peripheral vascular disease, unspecified 12/04/2021 No Yes ARTUR, PUPILLO ([id number]) [phone].pdf Page 5 of 10 [phone] Non-pressure chronic ulcer of left thigh with fat layer exposed 01/14/2022 No Yes Inactive Problems Resolved Problems Electronic Signature(s) Signed: 06/01/2022 9:55:22 AM By: [name] Entered By: Fredirick Maudlin on 06/01/2022 09:55:21 -------------------------------------------------------------------------------- Progress Note Details Patient Name: Date of Service: [name], [place] C. 06/01/2022 9:30 A M Medical Record Number: [id number] Patient Account Number: [id number] Date of Birth/Sex: Treating RN: 05-06-1950 (72 y.o. M) Primary Care Provider: [name] Other Clinician: Referring Provider: Treating Provider/Extender: [name] in Treatment: 25 Subjective Chief Complaint Information obtained from Patient Patient presents to the wound care center today with an open arterial ulcer to the right lower extremity in the setting of diabetes mellitus. he has had this problem for 7 months 12/04/2021: ulcer to right lower anterior tibial surface History of Present Illness (HPI) The following HPI elements were documented for the patient's wound: Location: right lateral calf closer to the knee Quality: Patient reports experiencing a dull pain to affected area(s). Severity: Patient states wound(s) are getting worse. Duration: Patient has had the wound for > 7 months prior to seeking treatment at the wound  center Timing: Pain in wound is constant (hurts all the time) Context: The wound would happen gradually Modifying Factors: Patient wound(s)/ulcer(s) are worsening due to : no resolution and a white material at the base of the wound Associated Signs and Symptoms: Patient reports having:no discharge or purulent material 72 year old gentleman who has been referred to was from his PCP for a chronic ulceration on his right lower extremity which she's had for several weeks. Past medical history significant for diabetes mellitus type 2, hypertension, hyperlipidemia, anxiety, obesity, peripheral vascular disease and chronic kidney disease. He has never been a smoker. Most recent lab work done at his PCPs office showed a glucose of 217 milligrams per deciliter which is consistent with hyperglycemia. In January 2017 recent hemoglobin A1c values noted to be 8.2%. In April 2017, he has been seen at the vascular office by Dr. Trula Slade and Dr. Kellie Simmering for right leg claudication. He had a right superficial femoral artery stent in September 2012. Recent noninvasive vascular imaging done on 06/24/2015 showed a right ABI of 1.07 with a triphasic waveform and a right TBI of 0.81. The left was noncompressible with a triphasic waveform and a TBI of 1.17. Right lower extremity arterial duplex was unable to identify stent exit but they were elevated velocities at the stent and in the distal thigh with triphasic waveforms. Dr. Stephens Shire opinion was to return to the clinic in 6 months with ABI and right lower extremity arterial duplex studies to be done. He has seen a dermatologist who had done a biopsy and said it was benign and he was given a steroid cream. 10/23/2015 -- Pathology report of the biopsy done in April 2017 shows ulcer with underlying angiodermatitis consistent with stasis dermatitis. 10/30/2015 -- he is going for shoulder surgery in about 10 days' time and was asking about the perioperative care. His blood  sugars are running in the high 100s or low 200s. No hemoglobin A1c done recently. 11/20/2015 -- is back up to 2 weeks because of recent left shoulder surgery. 12/04/15; patient returns today with the wound for  the most part looking healthy. No evidence of infection no debridement required. He is using Prisma for 4 weeks, without obvious improvement per her intake nurse. This started as several small open areas that were raised erythematous. He saw dermatology and at some point this was biopsied that just suggested stasis skin physiology. This certainly doesn't look like that 12/11/15; using Hydrafera Blue. Previous biopsy reviewed, no atypia PAS negative. He has a history of stents in the right leg however he does not appear to have primary arterial insufficiency ABI in this clinic was over 0.9. 12/25/2015 -- he has been approved for grafix and we will order for some to be applied next week 01/01/2016 -- he has had his first application of Grafix today 01/08/2016 -- he has had his second application of Grafix today READMISSION 12/04/2021 This is a now 72 year old man who was followed in our clinic about 5 years ago for an ulceration on his right lateral lower leg, just distal to the knee. He has since undergone a number of revascularization procedures that have been complicated by restenosis and wound infections. During the course of this treatment, he developed a small ulcer on his right anterior tibial surface. Despite use of an Unna boot, the wound has continued to expand. He was referred to the wound care center by Dr. Trula Slade for further evaluation and management. On the right anterior tibial surface, there is an irregular wound with heavy slough and eschar accumulation. The periwound skin is intact but he does have 1-2+ pitting edema. There is no purulent drainage or malodor. 10/13; second visit for this man who has an ischemic wound in the setting of type 2 diabetes on the right anterior mid  tibia area. He has been revascularized. Using Santyl gent 12/22/2021: The wound is about the same size this week. There is a lot of gray sloughy material on the surface, secondary to silver nitrate used to stop Michael [name], Michael [name] ([id number]) [phone].pdf Page 6 of 10 bleeding after what sounds like a fairly aggressive debridement last week. 12/30/2021: The wound is smaller this week and a bit cleaner. Edema control is excellent. There is still a bit of slough accumulation. 01/07/2022: The wound continues to contract and is much cleaner this week. Edema control is very good. He has small openings in his upper leg surgical scars, but he is going to be seeing Dr. Trula Slade next Monday and will have him take a look. He has been applying manuka honey to both of the sites. We have been using Santyl and gentamicin on his lower leg wound. 01/14/2022: He saw vascular surgery this week and was told that they were happy to have [place] manage the 2 wounds from his operation. Both of these have a light layer of slough on the surface. The more distal wound is a little bit dry. The anterior tibial wound continues to accumulate a fair amount of slough. Edema control is good. 01/19/2022: Both upper leg wounds are smaller. The more distal is quite dry and cracked when I was examining it. The lower leg wound is more painful and the surrounding tissue is erythematous and indurated. 01/26/2022: The upper leg wounds are healed. The skin at the distal leg wound is quite dry. The lower leg wound is cleaner but still fairly painful. His wrap slid again this week. 02/02/2022: We had good success keeping his wrap intact using a standard Unna boot. The lower leg wound continues to contract and is clean and less painful this week. 12/11;  TheraSkin #1 12/18; TheraSkin #2. Wound looks as though it is contracted. Patient states that his pain was better in the leg. Secondary dressing and kerlix  Coban 02/24/2022: The wound is smaller since I saw it last. It is less painful and there is good granulation tissue, including over the exposed tendon. 03/09/2022: The wound continues to contract. There is continued fill of the wound cavity with granulation tissue. 03/16/2022: The wound is smaller again today. There is good granulation tissue forming, covering the tendon. 03/30/2022: The wound continues to contract and fill with good granulation tissue. The granulation tissue is oozing a little bit at the lateral edge. 04/13/2022: We left his TheraSkin on an extra week because it was looking so good. The wound is smaller this week and flush with the surrounding skin. His medial thigh wound has opened up again. There is slough and eschar present. 04/20/2022: The anterior tibial wound continues to contract. There is good granulation tissue with just a light layer of slough. The medial thigh ulcer is superficial with slough present. It is a bit fibrotic due to being in a scar. 04/27/2022: The anterior tibial wound continues to contract. There is still some slough buildup with good granulation tissue. The medial thigh ulcer is very leathery and dry. 05/04/2022: The anterior tibial wound is even smaller today. There is no visible tendon at all. He has some slough and eschar accumulation. The medial thigh ulcer continues to be quite dry with a leathery surface. 05/11/2022: The anterior tibial wound continues to contract. There is a little bit of eschar and slough buildup. The granulation tissue is also a bit hypertrophic. The medial thigh ulcer is dry and leathery, once again. 05/25/2022: The anterior tibial wound is quite a bit smaller this week. There is a little bit of eschar and slough. No reaccumulation of hypertrophic granulation tissue. The medial thigh ulcer has slightly improved tissue quality. It is still somewhat fibrotic, but there is at least a light pink color to the tissue. 06/01/2022: Both wounds are  smaller. There is hypertrophic granulation tissue on the anterior tibial wound. The quality of the tissue continues to improve on the medial thigh ulcer. Patient History Information obtained from Patient. Family History Cancer - Siblings, Heart Disease - Father, Hypertension - Father,Mother, No family history of Hereditary Spherocytosis, Kidney Disease, Seizures, Stroke, Thyroid Problems, Tuberculosis. Social History Former smoker - smokeless tobacco, Marital Status - Married, Alcohol Use - Rarely - WINE, Drug Use - No History, Caffeine Use - Daily - COFFEE. Medical History Ear/Nose/Mouth/Throat Patient has history of Chronic sinus problems/congestion - seasonal allergies Cardiovascular Patient has history of Hypertension, Peripheral Arterial Disease - STENTS, Peripheral Venous Disease Endocrine Patient has history of Type II Diabetes - last A1c- 8.2 Hospitalization/Surgery History - left shoulder surgery. Medical A Surgical History Notes nd Constitutional Symptoms (General Health) obesity , h/o (R) leg claudication (right fem stent September 2012) Cardiovascular hyperlipidemia Endocrine pt. on diet intentionally losing 20 pounds since December 2016 in an effort to improve A1C levels Integumentary (Skin) psoriatic arthritis Musculoskeletal bilat knee pain identified as an ortho issue psoriatic arthritis Oncologic Prostate cancer 2018 Psychiatric anxiety Michael [name], Michael [name] ([id number]) [id number] Page 7 of 10 Objective Constitutional no acute distress. Vitals Time Taken: 9:40 AM, Height: 72 in, Weight: 208 lbs, BMI: 28.2, Temperature: 97.9 F, Pulse: 83 bpm, Respiratory Rate: 20 breaths/min, Blood Pressure: 128/77 mmHg, Capillary Blood Glucose: 105 mg/dl. Respiratory Normal work of breathing on room air. General Notes: 06/01/2022: Both wounds are smaller. There  is hypertrophic granulation tissue on the anterior tibial wound. The quality of the tissue  continues to improve on the medial thigh ulcer. Integumentary (Hair, Skin) Wound #3 status is Open. Original cause of wound was Blister. The date acquired was: 09/30/2021. The wound has been in treatment 25 weeks. The wound is located on the Right,Anterior Lower Leg. The wound measures 1cm length x 0.7cm width x 0.1cm depth; 0.55cm^2 area and 0.055cm^3 volume. There is Fat Layer (Subcutaneous Tissue) exposed. There is no tunneling or undermining noted. There is a medium amount of serosanguineous drainage noted. The wound margin is distinct with the outline attached to the wound base. There is large (67-100%) pink, friable, hyper - granulation within the wound bed. There is a small (1-33%) amount of necrotic tissue within the wound bed including Adherent Slough. The periwound skin appearance did not exhibit: Callus, Crepitus, Excoriation, Induration, Rash, Scarring, Dry/Scaly, Maceration, Atrophie Blanche, Cyanosis, Ecchymosis, Hemosiderin Staining, Mottled, Pallor, Rubor, Erythema. Periwound temperature was noted as No Abnormality. Wound #4 status is Open. Original cause of wound was Gradually Appeared. The date acquired was: 01/05/2022. The wound has been in treatment 20 weeks. The wound is located on the Right,Medial Upper Leg. The wound measures 2.5cm length x 1.4cm width x 0.1cm depth; 2.749cm^2 area and 0.275cm^3 volume. There is Fat Layer (Subcutaneous Tissue) exposed. There is no tunneling or undermining noted. There is a medium amount of serosanguineous drainage noted. The wound margin is distinct with the outline attached to the wound base. There is large (67-100%) pink, pale granulation within the wound bed. There is a small (1-33%) amount of necrotic tissue within the wound bed including Adherent Slough. The periwound skin appearance had no abnormalities noted for moisture. The periwound skin appearance had no abnormalities noted for color. The periwound skin appearance exhibited: Scarring.  Periwound temperature was noted as No Abnormality. 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 An Chemical Cauterization procedure was performed by Duanne Guess, MD. Post procedure Diagnosis Wound #3: Same as Pre-Procedure Plan Follow-up Appointments: Return Appointment in 1 week. - Dr. Lady Gary - Room 3 Anesthetic: Wound #3 Right,Anterior Lower Leg: (In clinic) Topical Lidocaine 4% applied to wound bed Edema Control - Lymphedema / SCD / Other: Avoid standing for long periods of time. Additional Orders / Instructions: Other: - Doctor's note to return back to work 05/04/22 WOUND #3: - Lower Leg Wound Laterality: Right, Anterior Cleanser: Soap and Water 1 x Per Week/30 Days PIKE, [place] ([id number]) [phone].pdf Page 8 of 10 Discharge Instructions: May shower and wash wound with dial antibacterial soap and water prior to dressing change. Cleanser: Wound Cleanser (DME) (Generic) 1 x Per Week/30 Days Discharge Instructions: Cleanse the wound with wound cleanser prior to applying a clean dressing using gauze sponges, not tissue or cotton balls. Peri-Wound Care: Sween Lotion (Moisturizing lotion) 1 x Per Week/30 Days Discharge Instructions: Apply moisturizing lotion as directed Topical: Skintegrity Hydrogel 4 (oz) (DME) (Generic) 1 x Per Week/30 Days Discharge Instructions: Apply hydrogel as directed Prim Dressing: Endoform 2x2 in (DME) (Generic) 1 x Per Week/30  Days ary Discharge  Instructions: Moisten with hydrogel Secondary Dressing: Woven Gauze Sponge, Non-Sterile 4x4 in (DME) (Generic) 1 x Per Week/30 Days Discharge Instructions: Apply over primary dressing as directed. Secured With: [employer] Size 5, 10 (yds) (DME) (Generic) 1 x Per Week/30 Days Com pression Wrap: Kerlix Roll 4.5x3.1 (in/yd) (DME) (Generic) 1 x Per Week/30 Days Discharge Instructions: Apply Kerlix and Coban compression as directed. Com pression Wrap: Coban Self-Adherent Wrap 4x5 (in/yd) (DME) (Generic) 1 x Per Week/30 Days Discharge Instructions: Apply over Kerlix as directed. WOUND #4: - Upper Leg Wound Laterality: Right, Medial Cleanser: Soap and Water 1 x Per Day/30 Days Discharge Instructions: May shower and wash wound with dial antibacterial soap and water prior to dressing change. Cleanser: Wound Cleanser (DME) (Generic) 1 x Per Day/30 Days Discharge Instructions: Cleanse the wound with wound cleanser prior to applying a clean dressing using gauze sponges, not tissue or cotton balls. Topical: Skintegrity Hydrogel 4 (oz) (DME) (Generic) 1 x Per Day/30 Days Discharge Instructions: Apply hydrogel as directed Prim Dressing: Endoform 2x2 in (DME) (Generic) 1 x Per Day/30 Days ary Discharge Instructions: Moisten with hydrogel Secondary Dressing: Zetuvit Plus Silicone Border Dressing 4x4 (in/in) (DME) (Generic) 1 x Per Day/30 Days Discharge Instructions: Apply silicone border over primary dressing as directed. 06/01/2022: Both wounds are smaller. There is hypertrophic granulation tissue on the anterior tibial wound. The quality of the tissue continues to improve on the medial thigh ulcer. No debridement was necessary for either wound today. I did chemically cauterize the hypertrophic granulation tissue on the anterior tibia with silver nitrate. We will continue endoform to both sites. Kerlix and Coban wrap. He will continue to keep the thigh wound moist with hydrogel between  dressing changes. Follow-up in 1 week. Electronic Signature(s) Signed: 06/05/2022 7:57:18 AM By: Duanne Guess MD FACS Signed: 06/05/2022 1:00:20 PM By: Shawn Stall RN, [name] Previous Signature: 06/01/2022 9:57:33 AM Version By: Duanne Guess MD FACS Entered By: Shawn Stall on 06/04/2022 16:25:54 -------------------------------------------------------------------------------- HxROS Details Patient Name: Date of Service: [name], Michael LTER C. 06/01/2022 9:30 A M Medical Record Number: [id number] Patient Account Number: [id number] Date of Birth/Sex: Treating RN: 10-26-1950 (73 y.o. M) Primary Care Provider: Kae Heller Other Clinician: Referring Provider: Treating Provider/Extender: Tina Griffiths in Treatment: 25 Information Obtained From Patient Constitutional Symptoms (General Health) Medical History: Past Medical History Notes: obesity , h/o (R) leg claudication (right fem stent September 2012) Ear/Nose/Mouth/Throat Medical History: Positive for: Chronic sinus problems/congestion - seasonal allergies Cardiovascular Medical History: Positive for: Hypertension; Peripheral Arterial Disease - STENTS; Peripheral Venous Disease Past Medical History Notes: hyperlipidemia Endocrine Medical History: Positive for: Type II Diabetes - last A1c- 8.2 Kerwood, Pravin C ([id number]) [phone].pdf Page 9 of 10 Past Medical History Notes: pt. on diet intentionally losing 20 pounds since December 2016 in an effort to improve A1C levels Time with diabetes: 9 YRS Treated with: Insulin Blood sugar tested every day: Yes Tested : 7-8 TIMES Integumentary (Skin) Medical History: Past Medical History Notes: psoriatic arthritis Musculoskeletal Medical History: Past Medical History Notes: bilat knee pain identified as an ortho issue psoriatic arthritis Oncologic Medical History: Past Medical History Notes: Prostate cancer  2018 Psychiatric Medical History: Past Medical History Notes: anxiety HBO Extended History Items Ear/Nose/Mouth/Throat: Chronic sinus problems/congestion Immunizations Pneumococcal Vaccine: Received Pneumococcal Vaccination: Yes Received Pneumococcal Vaccination On or After 60th Birthday: Yes Implantable Devices None Hospitalization / Surgery History Type of Hospitalization/Surgery left shoulder surgery Family and Social History Cancer: Yes - Siblings; Heart Disease: Yes - Father;  Hereditary Spherocytosis: No; Hypertension: Yes - Father,Mother; Kidney Disease: No; Seizures: No; Stroke: No; Thyroid Problems: No; Tuberculosis: No; Former smoker - smokeless tobacco; Marital Status - Married; Alcohol Use: Rarely - WINE; Drug Use: No History; Caffeine Use: Daily - COFFEE; Financial Concerns: No; Food, Clothing or Shelter Needs: No; Support System Lacking: No; Transportation Concerns: No Electronic Signature(s) Signed: 06/01/2022 10:56:24 AM By: Duanne Guess MD FACS Entered By: Duanne Guess on 06/01/2022 09:56:19 -------------------------------------------------------------------------------- SuperBill Details Patient Name: Date of Service: [name], Michael LTER C. 06/01/2022 Medical Record Number: [id number] Patient Account Number: [id number] Date of Birth/Sex: Treating RN: 1950/12/02 (72 y.o. M) Primary Care Provider: Kae Heller Other Clinician: Referring Provider: Treating Provider/Extender: Tina Griffiths in Treatment: [address] 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 E11.622 Type 2 diabetes mellitus with other skin ulcer I73.9 Peripheral vascular disease,  unspecified L97.122 Non-pressure chronic ulcer of left thigh with fat layer exposed Facility Procedures : CPT4 Code: [id number] Description: 17250 - CHEM CAUT GRANULATION TISS ICD-10 Diagnosis Description L97.815 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 involveme Modifier: nt without evidence Quantity: 1 of necrosis Physician Procedures : CPT4 Code Description Modifier [id number] - WC PHYS LEVEL 3 - EST PT 25 ICD-10 Diagnosis Description L97.815 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

## 2022-06-02 NOTE — Progress Notes (Signed)
HURTIS, SCIMECA ([id number]) [id number] Page 1 of 8 Visit Report for 06/01/2022 Arrival Information Details Patient Name: Date of Service: Michael [name], Michael C. 06/01/2022 9:30 A M Medical Record Number: [id number] Patient Account Number: [id number] Date of Birth/Sex: Treating RN: 04-Feb-1951 (72 y.o. Hessie Diener Primary Care Khilee Hendricksen: [name] Other Clinician: Referring Ramia Sidney: Treating Maxima Skelton/Extender: [name] in Treatment: 25 Visit Information History Since Last Visit Added or deleted any medications: No Patient Arrived: Ambulatory Any new allergies or adverse reactions: No Arrival Time: 09:36 Had a fall or experienced change in No Accompanied By: wife activities of daily living that may affect Transfer Assistance: None risk of falls: Patient Identification Verified: Yes Signs or symptoms of abuse/neglect since last visito No Secondary Verification Process Completed: Yes Hospitalized since last visit: No Patient Requires Transmission-Based Precautions: No Implantable device outside of the clinic excluding No Patient Has Alerts: Yes cellular tissue based products placed in the center Patient Alerts: Patient on Blood Thinner since last visit: plavix Has Dressing in Place as Prescribed: Yes Has Compression in Place as Prescribed: Yes Pain Present Now: Yes Electronic Signature(s) Signed: 06/02/2022 4:09:50 PM By: Deon Pilling RN, [name] Entered By: Deon Pilling on 06/01/2022 09:36:56 -------------------------------------------------------------------------------- Encounter Discharge Information Details Patient Name: Date of Service: Michael [name], Michael LTER C. 06/01/2022 9:30 A M Medical Record Number: [id number] Patient Account Number: [id number] Date of Birth/Sex: Treating RN: 23-Sep-1950 (72 y.o. [name] Primary Care Athony Coppa: [name] Other Clinician: Referring Marzelle Rutten: Treating Urias Sheek/Extender:  [name] in Treatment: 25 Encounter Discharge Information Items Discharge Condition: Stable Ambulatory Status: Walker Discharge Destination: Home Transportation: Other Accompanied By: self Schedule Follow-up Appointment: Yes Clinical Summary of Care: Patient Declined Electronic Signature(s) Signed: 06/01/2022 12:10:10 PM By: Dellie Catholic RN Entered By: Dellie Catholic on 06/01/2022 12:02:38 -------------------------------------------------------------------------------- Lower Extremity Assessment Details Patient Name: Date of Service: Michael [name], Michael C. 06/01/2022 9:30 A M Medical Record Number: [id number] Patient Account Number: [id number] Date of Birth/Sex: Treating RN: 09-21-1950 (72 y.o. Hessie Diener Primary Care Evangaline Jou: [name] Other Clinician: Referring Marijose Curington: Treating Tiffany Talarico/Extender: [name] in Treatment: 25 Edema Assessment H[Left: Quay Burow [id number] Patrice [id number] Page 2 of 8] Assessed: [Left: No] [Right: Yes] Edema: [Left: [name]] [Right: o] Calf Left: Right: Point of Measurement: 35 cm From Medial Instep 36 cm Ankle Left: Right: Point of Measurement: 12 cm From Medial Instep 22 cm Vascular Assessment Pulses: Dorsalis Pedis Palpable: [Right:Yes] Electronic Signature(s) Signed: 06/02/2022 4:09:50 PM By: Deon Pilling RN, [name] Entered By: Deon Pilling on 06/01/2022 09:41:45 -------------------------------------------------------------------------------- Multi Wound Chart Details Patient Name: Date of Service: Michael [name], Michael LTER C. 06/01/2022 9:30 A M Medical Record Number: [id number] Patient Account Number: [id number] Date of Birth/Sex: Treating RN: 03/13/1950 (72 y.o. M) Primary Care Takeisha Cianci: [name] Other Clinician: Referring Alyana Kreiter: Treating Caterina Racine/Extender: [name] in Treatment: 25 Vital  Signs Height(in): 16 Capillary Blood Glucose(mg/dl): 105 Weight(lbs): 208 Pulse(bpm): 48 Body Mass Index(BMI): 28.2 Blood Pressure(mmHg): 128/77 Temperature(F): 97.9 Respiratory Rate(breaths/min): 20 [3:Photos:] [[name]/A:[name]/A] Right, Anterior Lower Leg Right, Medial Upper Leg [name]/A Wound Location: Blister Gradually Appeared [name]/A Wounding Event: Diabetic Wound/Ulcer of the Lower Dehisced Wound [name]/A Primary Etiology: Extremity Chronic sinus problems/congestion, Chronic sinus problems/congestion, [name]/A Comorbid History: Hypertension, Peripheral Arterial Hypertension, Peripheral Arterial Disease, Peripheral Venous Disease, Disease, Peripheral Venous Disease, Type II Diabetes Type II Diabetes 09/30/2021 01/05/2022 [name]/A Date Acquired: 75 20 [name]/A Weeks of Treatment: Open  Open [name]/A Wound Status: No No [name]/A Wound Recurrence: 1x0.7x0.1 2.5x1.4x0.1 [name]/A Measurements L x W x D (cm) 0.55 2.749 [name]/A A (cm) : rea 0.055 0.275 [name]/A Volume (cm) : 96.90% [name]/A [name]/A % Reduction in Area: 99.00% [name]/A [name]/A % Reduction in Volume: Grade 2 Full Thickness Without Exposed [name]/A Classification: Support Structures Medium Medium [name]/A Exudate Amount: Serosanguineous Serosanguineous [name]/A Exudate Type: red, brown red, brown [name]/A Exudate Color: Distinct, outline attached Distinct, outline attached [name]/A Wound Margin: Large (67-100%) Large (67-100%) [name]/A Granulation Amount: RYER, KERWICK ([id number] Page 3 of 8 Pink, Hyper-granulation, Friable Pink, Pale [name]/A Granulation Quality: Small (1-33%) Small (1-33%) [name]/A Necrotic Amount: Fat Layer (Subcutaneous Tissue): Yes Fat Layer (Subcutaneous Tissue): Yes [name]/A Exposed Structures: Fascia: No Fascia: No Tendon: No Tendon: No Muscle: No Muscle: No Joint: No Joint: No Bone: No Bone: No Medium (34-66%) Small (1-33%) [name]/A Epithelialization: Excoriation: No Scarring: Yes [name]/A Periwound Skin Texture: Induration: No Callus:  No Crepitus: No Rash: No Scarring: No Maceration: No No Abnormalities Noted [name]/A Periwound Skin Moisture: Dry/Scaly: No Atrophie Blanche: No No Abnormalities Noted [name]/A Periwound Skin Color: Cyanosis: No Ecchymosis: No Erythema: No Hemosiderin Staining: No Mottled: No Pallor: No Rubor: No No Abnormality No Abnormality [name]/A Temperature: Chemical Cauterization [name]/A [name]/A Procedures Performed: Treatment Notes Electronic Signature(s) Signed: 06/01/2022 9:55:34 AM By: [name] Entered By: Fredirick Maudlin on 06/01/2022 09:55:34 -------------------------------------------------------------------------------- Multi-Disciplinary Care Plan Details Patient Name: Date of Service: Michael [name], Michael LTER C. 06/01/2022 9:30 A M Medical Record Number: [id number] Patient Account Number: [id number] Date of Birth/Sex: Treating RN: [birth year] (72 y.o. [name] Primary Care Analisse Randle: [name] Other Clinician: Referring Alohilani Levenhagen: Treating Travas Schexnayder/Extender: [name] in Treatment: 25 Active Inactive Nutrition Nursing Diagnoses: Potential for alteratiion in Nutrition/Potential for imbalanced nutrition Goals: Patient/caregiver agrees to and verbalizes understanding of need to use nutritional supplements and/or vitamins as prescribed Date Initiated: 12/04/2021 Target Resolution Date: 10/29/2022 Goal Status: Active Patient/caregiver verbalizes understanding of need to maintain therapeutic glucose control per primary care physician Date Initiated: 12/04/2021 Target Resolution Date: 10/29/2022 Goal Status: Active Interventions: Assess patient nutrition upon admission and as needed per policy Provide education on elevated blood sugars and impact on wound healing Provide education on nutrition Treatment Activities: Education provided on Nutrition : 01/19/2022 Notes: Wound/Skin Impairment Nursing Diagnoses: Impaired tissue integrity Knowledge  deficit related to ulceration/compromised skin integrity TAJH, PINKHASOV ([id number]) [phone].pdf Page 4 of 8 Goals: Patient/caregiver will verbalize understanding of skin care regimen Date Initiated: 12/04/2021 Date Inactivated: 02/02/2022 Target Resolution Date: 01/28/2022 Goal Status: Met Ulcer/skin breakdown will have a volume reduction of 30% by week 4 Date Initiated: 12/04/2021 Date Inactivated: 01/07/2022 Target Resolution Date: 12/26/2021 Goal Status: Met Ulcer/skin breakdown will have a volume reduction of 50% by week 8 Date Initiated: 01/07/2022 Date Inactivated: 03/16/2022 Target Resolution Date: 04/01/2022 Goal Status: Met Ulcer/skin breakdown will have a volume reduction of 80% by week 12 Date Initiated: 03/16/2022 Target Resolution Date: 10/29/2022 Goal Status: Active Interventions: Assess ulceration(s) every visit Provide education on ulcer and skin care Notes: Electronic Signature(s) Signed: 06/01/2022 12:10:10 PM By: Dellie Catholic RN Entered By: Dellie Catholic on 06/01/2022 12:01:21 -------------------------------------------------------------------------------- Pain Assessment Details Patient Name: Date of Service: Michael [name], Michael C. 06/01/2022 9:30 A M Medical Record Number: [id number] Patient Account Number: [id number] Date of Birth/Sex: Treating RN: 09-10-1950 (72 y.o. Hessie Diener Primary Care Yahaira Bruski: [name] Other Clinician: Referring Ambers Iyengar: Treating Deston Bilyeu/Extender: [name] in Treatment:  25 Active Problems Location of Pain Severity and Description of Pain Patient Has Paino Yes Site Locations Pain Location: Pain in Ulcers Rate the pain. Current Pain Level: 2 Pain Management and Medication Current Pain Management: Medication: No Cold Application: No Rest: No Massage: No Activity: No T.E.[name].S.: No Heat Application: No Leg drop or elevation: No Is the Current Pain Management  Adequate: Adequate How does your wound impact your activities of daily livingo Sleep: No Bathing: No Appetite: No Relationship With Others: No Bladder Continence: No Emotions: No Bowel Continence: No Work: No Toileting: No Drive: No Dressing: No Hobbies: No KEYLIN, STETTNER ([id number]) [id number] Page 5 of 8 Electronic Signature(s) Signed: 06/02/2022 4:09:50 PM By: Deon Pilling RN, [name] Entered By: Deon Pilling on 06/01/2022 09:37:08 -------------------------------------------------------------------------------- Patient/Caregiver Education Details Patient Name: Date of Service: Michael [name], Michael LTER C. 4/1/2024andnbsp9:30 [place] Record Number: [id number] Patient Account Number: [id number] Date of Birth/Gender: Treating RN: 16-Aug-1950 (72 y.o. [name] Primary Care Physician: [name] Other Clinician: Referring Physician: Treating Physician/Extender: [name] in Treatment: 25 Education Assessment Education Provided To: Patient Education Topics Provided Wound/Skin Impairment: Methods: Explain/Verbal Responses: Return demonstration correctly Electronic Signature(s) Signed: 06/01/2022 12:10:10 PM By: Dellie Catholic RN Entered By: Dellie Catholic on 06/01/2022 12:01:38 -------------------------------------------------------------------------------- Wound Assessment Details Patient Name: Date of Service: Michael [name], Michael [place] C. 06/01/2022 9:30 A M Medical Record Number: [id number] Patient Account Number: [id number] Date of Birth/Sex: Treating RN: 08/08/1950 (72 y.o. Lorette Ang, Tammi Klippel Primary Care Loring Liskey: [name] Other Clinician: Referring Nathan Stallworth: Treating Janean Eischen/Extender: [name] in Treatment: 25 Wound Status Wound Number: 3 Primary Diabetic Wound/Ulcer of the Lower Extremity Etiology: Wound Location: Right, Anterior Lower Leg Wound Open Wounding Event:  Blister Status: Date Acquired: 09/30/2021 Comorbid Chronic sinus problems/congestion, Hypertension, Peripheral Weeks Of Treatment: 25 History: Arterial Disease, Peripheral Venous Disease, Type II Diabetes Clustered Wound: No Photos Wound Measurements Length: (cm) 1 Width: (cm) 0.7 Depth: (cm) 0.1 Area: (cm) 0.55 Hartner, Delford C ([id number]) Volume: (cm) 0.055 % Reduction in Area: 96.9% % Reduction in Volume: 99% Epithelialization: Medium (34-66%) Tunneling: No [id number] Page 6 of 8 Undermining: No Wound Description Classification: Grade 2 Wound Margin: Distinct, outline attached Exudate Amount: Medium Exudate Type: Serosanguineous Exudate Color: red, brown Foul Odor After Cleansing: No Slough/Fibrino Yes Wound Bed Granulation Amount: Large (67-100%) Exposed Structure Granulation Quality: Pink, Hyper-granulation, Friable Fascia Exposed: No Necrotic Amount: Small (1-33%) Fat Layer (Subcutaneous Tissue) Exposed: Yes Necrotic Quality: Adherent Slough Tendon Exposed: No Muscle Exposed: No Joint Exposed: No Bone Exposed: No Periwound Skin Texture Texture Color No Abnormalities Noted: No No Abnormalities Noted: No Callus: No Atrophie Blanche: No Crepitus: No Cyanosis: No Excoriation: No Ecchymosis: No Induration: No Erythema: No Rash: No Hemosiderin Staining: No Scarring: No Mottled: No Pallor: No Moisture Rubor: No No Abnormalities Noted: No Dry / Scaly: No Temperature / Pain Maceration: No Temperature: No Abnormality Treatment Notes Wound #3 (Lower Leg) Wound Laterality: Right, Anterior Cleanser Soap and Water Discharge Instruction: May shower and wash wound with dial antibacterial soap and water prior to dressing change. Wound Cleanser Discharge Instruction: Cleanse the wound with wound cleanser prior to applying a clean dressing using gauze sponges, not tissue or cotton balls. Peri-Wound Care Sween Lotion (Moisturizing  lotion) Discharge Instruction: Apply moisturizing lotion as directed Topical Skintegrity Hydrogel 4 (oz) Discharge Instruction: Apply hydrogel as directed Primary Dressing Endoform 2x2 in Discharge Instruction: Moisten with hydrogel Secondary Dressing Woven Gauze Sponge, Non-Sterile 4x4 in Discharge  Instruction: Apply over primary dressing as directed. Secured With [employer] Size 5, 10 (yds) Compression Wrap Kerlix Roll 4.5x3.1 (in/yd) Discharge Instruction: Apply Kerlix and Coban compression as directed. Coban Self-Adherent Wrap 4x5 (in/yd) Discharge Instruction: Apply over Kerlix as directed. Compression Stockings Add-Ons Electronic Signature(s) Signed: 06/02/2022 4:09:50 PM By: Deon Pilling RN, [name] [name] ([id number]) [id number] Page 7 of 8 Entered By: Deon Pilling on 06/01/2022 09:44:47 -------------------------------------------------------------------------------- Wound Assessment Details Patient Name: Date of Service: Michael [name], Michael C. 06/01/2022 9:30 A M Medical Record Number: [id number] Patient Account Number: [id number] Date of Birth/Sex: Treating RN: Nov 06, 1950 (72 y.o. Lorette Ang, Tammi Klippel Primary Care Kenisha Lynds: [name] Other Clinician: Referring Gedalya Jim: Treating Shondell Poulson/Extender: [name] in Treatment: 25 Wound Status Wound Number: 4 Primary Dehisced Wound Etiology: Wound Location: Right, Medial Upper Leg Wound Open Wounding Event: Gradually Appeared Status: Date Acquired: 01/05/2022 Comorbid Chronic sinus problems/congestion, Hypertension, Peripheral Weeks Of Treatment: 20 History: Arterial Disease, Peripheral Venous Disease, Type II Diabetes Clustered Wound: No Photos Wound Measurements Length: (cm) 2.5 Width: (cm) 1.4 Depth: (cm) 0.1 Area: (cm) 2.749 Volume: (cm) 0.275 % Reduction in Area: % Reduction in Volume: Epithelialization: Small (1-33%) Tunneling:  No Undermining: No Wound Description Classification: Full Thickness Without Exposed Support Structures Wound Margin: Distinct, outline attached Exudate Amount: Medium Exudate Type: Serosanguineous Exudate Color: red, brown Foul Odor After Cleansing: No Slough/Fibrino Yes Wound Bed Granulation Amount: Large (67-100%) Exposed Structure Granulation Quality: Pink, Pale Fascia Exposed: No Necrotic Amount: Small (1-33%) Fat Layer (Subcutaneous Tissue) Exposed: Yes Necrotic Quality: Adherent Slough Tendon Exposed: No Muscle Exposed: No Joint Exposed: No Bone Exposed: No Periwound Skin Texture Texture Color No Abnormalities Noted: No No Abnormalities Noted: Yes Scarring: Yes Temperature / Pain Temperature: No Abnormality Moisture No Abnormalities Noted: Yes Treatment Notes Wound #4 (Upper Leg) Wound Laterality: Right, Medial Cleanser Soap and Water Discharge Instruction: May shower and wash wound with dial antibacterial soap and water prior to dressing change. Michael [name], Michael [name] ([id number]) [id number] Page 8 of 8 Wound Cleanser Discharge Instruction: Cleanse the wound with wound cleanser prior to applying a clean dressing using gauze sponges, not tissue or cotton balls. Peri-Wound Care Topical Skintegrity Hydrogel 4 (oz) Discharge Instruction: Apply hydrogel as directed Primary Dressing Endoform 2x2 in Discharge Instruction: Moisten with hydrogel Secondary Dressing Zetuvit Plus Silicone Border Dressing 4x4 (in/in) Discharge Instruction: Apply silicone border over primary dressing as directed. Secured With Compression Wrap Compression Stockings Environmental education officer) Signed: 06/02/2022 4:09:50 PM By: Deon Pilling RN, [name] Entered By: Deon Pilling on 06/01/2022 09:45:12 -------------------------------------------------------------------------------- Vitals Details Patient Name: Date of Service: Michael [name], [place] C. 06/01/2022 9:30 A  M Medical Record Number: [id number] Patient Account Number: [id number] Date of Birth/Sex: Treating RN: January 20, 1951 (72 y.o. Lorette Ang, Tammi Klippel Primary Care Nikole Swartzentruber: [name] Other Clinician: Referring Marsh Heckler: Treating Caylee Vlachos/Extender: [name] in Treatment: 25 Vital Signs Time Taken: 09:40 Temperature (F): 97.9 Height (in): 72 Pulse (bpm): 83 Weight (lbs): 208 Respiratory Rate (breaths/min): 20 Body Mass Index (BMI): 28.2 Blood Pressure (mmHg): 128/77 Capillary Blood Glucose (mg/dl): 105 Reference Range: 80 - 120 mg / dl Electronic Signature(s) Signed: 06/02/2022 4:09:50 PM By: Deon Pilling RN, [name] Entered By: Deon Pilling on 06/01/2022 09:40:45

## 2022-06-05 DIAGNOSIS — E1165 Type 2 diabetes mellitus with hyperglycemia: Secondary | ICD-10-CM | POA: Diagnosis not present

## 2022-06-08 ENCOUNTER — Encounter (HOSPITAL_BASED_OUTPATIENT_CLINIC_OR_DEPARTMENT_OTHER): Payer: Medicare Other | Admitting: General Surgery

## 2022-06-08 DIAGNOSIS — E1151 Type 2 diabetes mellitus with diabetic peripheral angiopathy without gangrene: Secondary | ICD-10-CM | POA: Diagnosis not present

## 2022-06-08 DIAGNOSIS — E11622 Type 2 diabetes mellitus with other skin ulcer: Secondary | ICD-10-CM | POA: Diagnosis not present

## 2022-06-08 DIAGNOSIS — L97815 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: Secondary | ICD-10-CM | POA: Diagnosis not present

## 2022-06-08 DIAGNOSIS — T8131XA Disruption of external operation (surgical) wound, not elsewhere classified, initial encounter: Secondary | ICD-10-CM | POA: Diagnosis not present

## 2022-06-08 DIAGNOSIS — L97122 Non-pressure chronic ulcer of left thigh with fat layer exposed: Secondary | ICD-10-CM | POA: Diagnosis not present

## 2022-06-08 DIAGNOSIS — I70239 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: Secondary | ICD-10-CM | POA: Diagnosis not present

## 2022-06-08 DIAGNOSIS — L97812 Non-pressure chronic ulcer of other part of right lower leg with fat layer exposed: Secondary | ICD-10-CM | POA: Diagnosis not present

## 2022-06-09 NOTE — Progress Notes (Signed)
CAREEM, YASUI ([id number]) [phone].pdf Page 1 of 9 Visit Report for 06/08/2022 Arrival Information Details Patient Name: Date of Service: HO FFMA [place], [place] [name]. 06/08/2022 9:30 A M Medical Record Number: [id number] Patient Account Number: [id number] Date of Birth/Sex: Treating RN: December 20, 1950 (72 y.o. Michael [name] Primary Care Michael [name]: Michael [name] Other Clinician: Referring Michael [name]: Treating Michael [name]/Extender: Michael [name] in Treatment: 26 Visit Information History Since Last Visit Added or deleted any medications: No Patient Arrived: Ambulatory Any new allergies or adverse reactions: No Arrival Time: 09:25 Had a fall or experienced change in No Accompanied By: spouse activities of daily living that may affect Transfer Assistance: None risk of falls: Patient Identification Verified: Yes Signs or symptoms of abuse/neglect since last visito No Patient Requires Transmission-Based Precautions: No Hospitalized since last visit: No Patient Has Alerts: Yes Implantable device outside of the clinic excluding No Patient Alerts: Patient on Blood Thinner cellular tissue based products placed in the center plavix since last visit: Has Dressing in Place as Prescribed: Yes Has Compression in Place as Prescribed: Yes Pain Present Now: No Electronic Signature(s) Signed: 06/08/2022 5:44:32 PM By: Michael Schwalbe RN Entered By: Michael [name] on 06/08/2022 09:25:57 -------------------------------------------------------------------------------- Encounter Discharge Information Details Patient Name: Date of Service: [name], [name] 06/08/2022 9:30 A M Medical Record Number: [id number] Patient Account Number: [id number] Date of Birth/Sex: Treating RN: 1950-03-24 (72 y.o. Michael [name] Primary Care Michael [name]: Michael [name] Other Clinician: Referring Ronson Hagins: Treating Michael [name]/Extender: Michael [name] in  Treatment: 26 Encounter Discharge Information Items Post Procedure Vitals Discharge Condition: Stable Temperature (F): 98.4 Ambulatory Status: Ambulatory Pulse (bpm): 81 Discharge Destination: Home Respiratory Rate (breaths/min): 18 Transportation: Private Auto Blood Pressure (mmHg): 138/81 Accompanied By: spouse Schedule Follow-up Appointment: Yes Clinical Summary of Care: Patient Declined Electronic Signature(s) Signed: 06/08/2022 5:44:32 PM By: Michael Schwalbe RN Entered By: Michael [name] on 06/08/2022 17:42:30 -------------------------------------------------------------------------------- Lower Extremity Assessment Details Patient Name: Date of Service: [name], [place] [name]. 06/08/2022 9:30 A M Medical Record Number: [id number] Patient Account Number: [id number] Date of Birth/Sex: Treating RN: [birth year] (72 y.o. Michael [name] Primary Care Michael [name]: Michael [name] Other Clinician: Referring Lavalle Skoda: Treating Michael [name]/Extender: Michael [name] in Treatment: 26 Edema Assessment H[Left: [name] ([id number])] Franne Forts: [id number].pdf Page 2 of 9] Assessed: [Left: No] [Right: No] Edema: [Left: N] [Right: o] Calf Left: Right: Point of Measurement: 35 cm From Medial Instep 38 cm Ankle Left: Right: Point of Measurement: 12 cm From Medial Instep 23.8 cm Knee To Floor Left: Right: From Medial Instep 44 cm Vascular Assessment Pulses: Dorsalis Pedis Palpable: [Right:Yes] Electronic Signature(s) Signed: 06/08/2022 5:44:32 PM By: Michael Schwalbe RN Entered By: Michael [name] on 06/08/2022 09:36:20 -------------------------------------------------------------------------------- Multi Wound Chart Details Patient Name: Date of Service: [name], [name] 06/08/2022 9:30 A M Medical Record Number: [id number] Patient Account Number: [id number] Date of Birth/Sex: Treating RN: 04-13-1950 (72 y.o. M) Primary Care Uyen Eichholz:  Michael [name] Other Clinician: Referring Michael [name]: Treating Michael [name]/Extender: Michael [name] in Treatment: 26 Vital Signs Height(in): 72 Pulse(bpm): 81 Weight(lbs): 208 Blood Pressure(mmHg): 138/81 Body Mass Index(BMI): 28.2 Temperature(F): 98.4 Respiratory Rate(breaths/min): 18 [3:Photos:] [N/A:N/A] Right, Anterior Lower Leg Right, Medial Upper Leg N/A Wound Location: Blister Gradually Appeared N/A Wounding Event: Diabetic Wound/Ulcer of the Lower Dehisced Wound N/A Primary Etiology: Extremity Chronic sinus problems/congestion, Chronic sinus problems/congestion, N/A Comorbid History: Hypertension, Peripheral Arterial Hypertension, Peripheral Arterial Disease, Peripheral Venous Disease, Disease, Peripheral Venous Disease, Type  II Diabetes Type II Diabetes 09/30/2021 01/05/2022 N/A Date Acquired: 75 21 N/A Weeks of Treatment: Open Open N/A Wound Status: No No N/A Wound Recurrence: 2x1x0.1 1.6x1.4x0.1 N/A Measurements L x W x D (cm) 1.571 1.759 N/A A (cm) : rea 0.157 0.176 N/A Volume (cm) : 91.10% N/A N/A % Reduction in Area: 97.00% N/A N/A % Reduction in Volume: Grade 2 Full Thickness Without Exposed N/A Classification: Support Structures Michael [name] ([id number]) [id number].pdf Page 3 of 9 Medium Medium N/A Exudate A mount: Serosanguineous Serosanguineous N/A Exudate Type: red, brown red, brown N/A Exudate Color: Distinct, outline attached Distinct, outline attached N/A Wound Margin: Large (67-100%) Large (67-100%) N/A Granulation Amount: Red, Hyper-granulation, Friable Pink N/A Granulation Quality: Small (1-33%) Small (1-33%) N/A Necrotic Amount: Adherent Slough Eschar, Adherent Slough N/A Necrotic Tissue: Fascia: No Fat Layer (Subcutaneous Tissue): Yes N/A Exposed Structures: Fat Layer (Subcutaneous Tissue): No Fascia: No Tendon: No Tendon: No Muscle: No Muscle: No Joint: No Joint:  No Bone: No Bone: No Medium (34-66%) Small (1-33%) N/A Epithelialization: Debridement - Selective/Open Wound Debridement - Selective/Open Wound N/A Debridement: Pre-procedure Verification/Time Out 09:50 09:50 N/A Taken: Lidocaine 5% topical ointment Lidocaine 5% topical ointment N/A Pain Control: Ambulance person, [employer] N/A Tissue Debrided: Non-Viable Tissue Non-Viable Tissue N/A Level: 2 2.24 N/A Debridement A (sq cm): rea Curette Curette N/A Instrument: Minimum Minimum N/A Bleeding: Silver Nitrate Pressure N/A Hemostasis A chieved: 0 0 N/A Procedural Pain: 0 0 N/A Post Procedural Pain: Procedure was tolerated well Procedure was tolerated well N/A Debridement Treatment Response: 2x1x0.1 1.6x1.4x0.1 N/A Post Debridement Measurements L x W x D (cm) 0.157 0.176 N/A Post Debridement Volume: (cm) Excoriation: No Scarring: Yes N/A Periwound Skin Texture: Induration: No Callus: No Crepitus: No Rash: No Scarring: No Maceration: No No Abnormalities Noted N/A Periwound Skin Moisture: Dry/Scaly: No Atrophie Blanche: No No Abnormalities Noted N/A Periwound Skin Color: Cyanosis: No Ecchymosis: No Erythema: No Hemosiderin Staining: No Mottled: No Pallor: No Rubor: No No Abnormality No Abnormality N/A Temperature: Debridement Debridement N/A Procedures Performed: Treatment Notes Electronic Signature(s) Signed: 06/08/2022 10:00:18 AM By: Duanne Guess MD FACS Entered By: Duanne Guess on 06/08/2022 10:00:18 -------------------------------------------------------------------------------- Multi-Disciplinary Care Plan Details Patient Name: Date of Service: [name], [name] 06/08/2022 9:30 A M Medical Record Number: [id number] Patient Account Number: [id number] Date of Birth/Sex: Treating RN: 1950/04/02 (73 y.o. Michael [name] Primary Care Arville Postlewaite: Michael [name] Other Clinician: Referring Jazmon Kos: Treating Shadeed Colberg/Extender: Michael [name] in Treatment: 26 Active Inactive Nutrition Nursing Diagnoses: Potential for alteratiion in Nutrition/Potential for imbalanced nutrition Goals: Patient/caregiver agrees to and verbalizes understanding of need to use nutritional supplements and/or vitamins as prescribed Date Initiated: 12/04/2021 Target Resolution Date: 10/29/2022 Goal Status: Active ANTONEY, BELEW ([id number]) [id number].pdf Page 4 of 9 Patient/caregiver verbalizes understanding of need to maintain therapeutic glucose control per primary care physician Date Initiated: 12/04/2021 Target Resolution Date: 10/29/2022 Goal Status: Active Interventions: Assess patient nutrition upon admission and as needed per policy Provide education on elevated blood sugars and impact on wound healing Provide education on nutrition Treatment Activities: Education provided on Nutrition : 01/26/2022 Notes: Wound/Skin Impairment Nursing Diagnoses: Impaired tissue integrity Knowledge deficit related to ulceration/compromised skin integrity Goals: Patient/caregiver will verbalize understanding of skin care regimen Date Initiated: 12/04/2021 Date Inactivated: 02/02/2022 Target Resolution Date: 01/28/2022 Goal Status: Met Ulcer/skin breakdown will have a volume reduction of 30% by week 4 Date Initiated: 12/04/2021 Date Inactivated: 01/07/2022 Target Resolution Date: 12/26/2021 Goal Status: Met Ulcer/skin breakdown will have  a volume reduction of 50% by week 8 Date Initiated: 01/07/2022 Date Inactivated: 03/16/2022 Target Resolution Date: 04/01/2022 Goal Status: Met Ulcer/skin breakdown will have a volume reduction of 80% by week 12 Date Initiated: 03/16/2022 Target Resolution Date: 10/29/2022 Goal Status: Active Interventions: Assess ulceration(s) every visit Provide education on ulcer and skin care Notes: Electronic Signature(s) Signed: 06/08/2022 5:44:32 PM By: Michael SchwalbeScotton, Joanne  RN Entered By: Michael SchwalbeScotton, Joanne on 06/08/2022 17:41:19 -------------------------------------------------------------------------------- Pain Assessment Details Patient Name: Date of Service: [name]. 06/08/2022 9:30 A M Medical Record Number: [id number] Patient Account Number: [id number] Date of Birth/Sex: Treating RN: Dec 01, 1950 (72 y.o. Michael LimboM) Scotton, Joanne Primary Care Afia Messenger: Michael HellerStamey, Hannah Other Clinician: Referring Sumer Moorehouse: Treating Farzana Koci/Extender: Michael Griffithsannon, Jennifer Stamey, Hannah Weeks in Treatment: 26 Active Problems Location of Pain Severity and Description of Pain Patient Has Paino No Site Locations Michael MainlandHOFFMAN, Michael [name] ([id number]) [id number] Page 5 of 9 Pain Management and Medication Current Pain Management: Electronic Signature(s) Signed: 06/08/2022 5:44:32 PM By: Michael SchwalbeScotton, Joanne RN Entered By: Michael SchwalbeScotton, Joanne on 06/08/2022 09:34:56 -------------------------------------------------------------------------------- Patient/Caregiver Education Details Patient Name: Date of Service: HO FFMA [place], [name] 4/8/2024andnbsp9:30 A M Medical Record Number: [id number] Patient Account Number: [id number] Date of Birth/Gender: Treating RN: Dec 01, 1950 (72 y.o. Michael LimboM) Scotton, Joanne Primary Care Physician: Michael HellerStamey, Hannah Other Clinician: Referring Physician: Treating Physician/Extender: Michael Griffithsannon, Jennifer Stamey, Hannah Weeks in Treatment: 26 Education Assessment Education Provided To: Patient Education Topics Provided Wound/Skin Impairment: Methods: Explain/Verbal Responses: Return demonstration correctly Electronic Signature(s) Signed: 06/08/2022 5:44:32 PM By: Michael SchwalbeScotton, Joanne RN Entered By: Michael SchwalbeScotton, Joanne on 06/08/2022 17:41:33 -------------------------------------------------------------------------------- Wound Assessment Details Patient Name: Date of Service: [name]. 06/08/2022 9:30 A M Medical Record Number: [id number] Patient  Account Number: [id number] Date of Birth/Sex: Treating RN: Dec 01, 1950 (72 y.o. Michael LimboM) Scotton, Joanne Primary Care Shalaya Swailes: Michael HellerStamey, Hannah Other Clinician: Referring Ciarra Braddy: Treating Zianne Schubring/Extender: Michael Griffithsannon, Jennifer Stamey, Hannah Weeks in Treatment: 26 Wound Status Wound Number: 3 Primary Diabetic Wound/Ulcer of the Lower Extremity Etiology: Wound Location: Right, Anterior Lower Leg Wound Open Wounding Event: Blister Status: Date Acquired: 09/30/2021 Comorbid Chronic sinus problems/congestion, Hypertension, Peripheral Weeks Of Treatment: 26 History: Arterial Disease, Peripheral Venous Disease, Type II Diabetes Clustered Wound: No Kistner, Hicks [name] ([id number]) [id number] Page 6 of 9 Photos Wound Measurements Length: (cm) 2 Width: (cm) 1 Depth: (cm) 0.1 Area: (cm) 1.571 Volume: (cm) 0.157 % Reduction in Area: 91.1% % Reduction in Volume: 97% Epithelialization: Medium (34-66%) Tunneling: No Undermining: No Wound Description Classification: Grade 2 Wound Margin: Distinct, outline attached Exudate Amount: Medium Exudate Type: Serosanguineous Exudate Color: red, brown Foul Odor After Cleansing: No Slough/Fibrino Yes Wound Bed Granulation Amount: Large (67-100%) Exposed Structure Granulation Quality: Red, Hyper-granulation, Friable Fascia Exposed: No Necrotic Amount: Small (1-33%) Fat Layer (Subcutaneous Tissue) Exposed: No Necrotic Quality: Adherent Slough Tendon Exposed: No Muscle Exposed: No Joint Exposed: No Bone Exposed: No Periwound Skin Texture Texture Color No Abnormalities Noted: No No Abnormalities Noted: No Callus: No Atrophie Blanche: No Crepitus: No Cyanosis: No Excoriation: No Ecchymosis: No Induration: No Erythema: No Rash: No Hemosiderin Staining: No Scarring: No Mottled: No Pallor: No Moisture Rubor: No No Abnormalities Noted: No Dry / Scaly: No Temperature / Pain Maceration: No Temperature: No  Abnormality Treatment Notes Wound #3 (Lower Leg) Wound Laterality: Right, Anterior Cleanser Soap and Water Discharge Instruction: May shower and wash wound with dial antibacterial soap and water prior to dressing change. Wound Cleanser Discharge Instruction: Cleanse the wound with wound cleanser prior to applying a clean dressing using  gauze sponges, not tissue or cotton balls. Peri-Wound Care Zinc Oxide Ointment 30g tube Discharge Instruction: Apply Zinc Oxide to periwound with each dressing change Sween Lotion (Moisturizing lotion) Discharge Instruction: Apply moisturizing lotion as directed Topical Primary Dressing Hydrofera Blue Ready Transfer Foam, 2.5x2.5 (in/in) Discharge Instruction: Apply directly to wound bed as directed TEANCUM, Michael [name] ([id number]) [phone].pdf Page 7 of 9 Secondary Dressing ABD Pad, 8x10 Discharge Instruction: Apply over primary dressing as directed. Woven Gauze Sponge, Non-Sterile 4x4 in Discharge Instruction: Apply over primary dressing as directed. Secured With [employer] Size 5, 10 (yds) Compression Wrap Kerlix Roll 4.5x3.1 (in/yd) Discharge Instruction: Apply Kerlix and Coban compression as directed. Coban Self-Adherent Wrap 4x5 (in/yd) Discharge Instruction: Apply over Kerlix as directed. Unnaboot w/Calamine, 4x10 (in/yd) Discharge Instruction: Apply Unnaboot to top of dressing and around ankles to prevent dressing from sliding Compression Stockings Add-Ons Electronic Signature(s) Signed: 06/08/2022 5:44:32 PM By: Michael Schwalbe RN Entered By: Michael [name] on 06/08/2022 09:45:45 -------------------------------------------------------------------------------- Wound Assessment Details Patient Name: Date of Service: [name], [place] [name]. 06/08/2022 9:30 A M Medical Record Number: [id number] Patient Account Number: [id number] Date of Birth/Sex: Treating RN: 05-16-1950 (72 y.o. Michael [name] Primary Care Aniza Shor:  Michael [name] Other Clinician: Referring Jayanna Kroeger: Treating Kaylyn Garrow/Extender: Michael [name] in Treatment: 26 Wound Status Wound Number: 4 Primary Dehisced Wound Etiology: Wound Location: Right, Medial Upper Leg Wound Open Wounding Event: Gradually Appeared Status: Date Acquired: 01/05/2022 Comorbid Chronic sinus problems/congestion, Hypertension, Peripheral Weeks Of Treatment: 21 History: Arterial Disease, Peripheral Venous Disease, Type II Diabetes Clustered Wound: No Photos Wound Measurements Length: (cm) 1.6 Width: (cm) 1.4 Depth: (cm) 0.1 Area: (cm) 1.759 Volume: (cm) 0.176 % Reduction in Area: % Reduction in Volume: Epithelialization: Small (1-33%) Tunneling: No Undermining: No Wound Description Classification: Full Thickness Without Exposed Support Structures Wound Margin: Distinct, outline attached Exudate Amount: Medium Exudate Type: Serosanguineous Exudate Color: red, brown Michael [name], Michael [name] ([id number]) Foul Odor After Cleansing: No Slough/Fibrino Yes [id number].pdf Page 8 of 9 Wound Bed Granulation Amount: Large (67-100%) Exposed Structure Granulation Quality: Pink Fascia Exposed: No Necrotic Amount: Small (1-33%) Fat Layer (Subcutaneous Tissue) Exposed: Yes Necrotic Quality: Eschar, Adherent Slough Tendon Exposed: No Muscle Exposed: No Joint Exposed: No Bone Exposed: No Periwound Skin Texture Texture Color No Abnormalities Noted: No No Abnormalities Noted: Yes Scarring: Yes Temperature / Pain Temperature: No Abnormality Moisture No Abnormalities Noted: Yes Treatment Notes Wound #4 (Upper Leg) Wound Laterality: Right, Medial Cleanser Soap and Water Discharge Instruction: May shower and wash wound with dial antibacterial soap and water prior to dressing change. Wound Cleanser Discharge Instruction: Cleanse the wound with wound cleanser prior to applying a clean dressing using gauze sponges, not  tissue or cotton balls. Peri-Wound Care Topical Primary Dressing Endoform 2x2 in Discharge Instruction: Moisten with hydrogel Secondary Dressing Zetuvit Plus Silicone Border Dressing 4x4 (in/in) Discharge Instruction: Apply silicone border over primary dressing as directed. Secured With Compression Wrap Compression Stockings Facilities manager) Signed: 06/08/2022 5:44:32 PM By: Michael Schwalbe RN Entered By: Michael [name] on 06/08/2022 09:59:37 -------------------------------------------------------------------------------- Vitals Details Patient Name: Date of Service: [name], [name] 06/08/2022 9:30 A M Medical Record Number: [id number] Patient Account Number: [id number] Date of Birth/Sex: Treating RN: [birth year] (72 y.o. Michael [name] Primary Care Karel Mowers: Michael [name] Other Clinician: Referring Watson Robarge: Treating [name]/Extender: Michael [name] in Treatment: 26 Vital Signs Time Taken: 09:32 Temperature (F): 98.4 Height (in): 72 Pulse (bpm): 81 Weight (lbs): 208 Respiratory Rate (breaths/min): 18  Body Mass Index (BMI): 28.2 Blood Pressure (mmHg): 138/81 Reference Range: 80 - 120 mg / dl Electronic Signature(s) Signed: 06/08/2022 5:44:32 PM By: Michael Schwalbe RN Entered By: Michael [name] on 06/08/2022 09:34:49 [place], Britt Boozer ([id number]) [id number] Page 9 of 9

## 2022-06-09 NOTE — Progress Notes (Signed)
Michael [name], Michael [name] ([id number]) [id number] Page 1 of 11 Visit Report for 06/08/2022 Chief Complaint Document Details Patient Name: Date of Service: HO FFMA Ewing, Michael C. 06/08/2022 9:30 A M Medical Record Number: [id number] Patient Account Number: [id number] Date of Birth/Sex: Treating RN: [birth year] (72 y.o. M) Primary Care Provider: Kae Heller Other Clinician: Referring Provider: Treating Provider/Extender: Tina Griffiths in Treatment: 26 Information Obtained from: Patient Chief Complaint Patient presents to the wound care center today with an open arterial ulcer to the right lower extremity in the setting of diabetes mellitus. he has had this problem for 7 months 12/04/2021: ulcer to right lower anterior tibial surface Electronic Signature(s) Signed: 06/08/2022 10:00:26 AM By: Duanne Guess MD FACS Entered By: Duanne Guess on 06/08/2022 10:00:26 -------------------------------------------------------------------------------- Debridement Details Patient Name: Date of Service: [name], [name] 06/08/2022 9:30 A M Medical Record Number: [id number] Patient Account Number: [id number] Date of Birth/Sex: Treating RN: 1950-12-23 (72 y.o. Michael [name] Primary Care Provider: Kae Heller Other Clinician: Referring Provider: Treating Provider/Extender: Tina Griffiths in Treatment: 26 Debridement Performed for Assessment: Wound #3 Right,Anterior Lower Leg Performed By: Physician Duanne Guess, MD Debridement Type: Debridement Severity of Tissue Pre Debridement: Fat layer exposed Level of Consciousness (Pre-procedure): Awake and Alert Pre-procedure Verification/Time Out Yes - 09:50 Taken: Start Time: 09:50 Pain Control: Lidocaine 5% topical ointment T Area Debrided (L x W): otal 2 (cm) x 1 (cm) = 2 (cm) Tissue and other material debrided: Non-Viable, Slough, Slough Level: Non-Viable  Tissue Debridement Description: Selective/Open Wound Instrument: Curette Bleeding: Minimum Hemostasis Achieved: Silver Nitrate End Time: 09:52 Procedural Pain: 0 Post Procedural Pain: 0 Response to Treatment: Procedure was tolerated well Level of Consciousness (Post- Awake and Alert procedure): Post Debridement Measurements of Total Wound Length: (cm) 2 Width: (cm) 1 Depth: (cm) 0.1 Volume: (cm) 0.157 Character of Wound/Ulcer Post Debridement: Improved Severity of Tissue Post Debridement: Fat layer exposed Post Procedure Diagnosis Same as Pre-procedure Notes Scribed for Dr. Lady Gary by J.Scotton Hyde, [place] ([id number]) [phone].pdf Page 2 of 11 Electronic Signature(s) Signed: 06/08/2022 12:22:47 PM By: Duanne Guess MD FACS Signed: 06/08/2022 5:44:32 PM By: Karie Schwalbe RN Entered By: Karie Schwalbe on 06/08/2022 09:57:17 -------------------------------------------------------------------------------- Debridement Details Patient Name: Date of Service: [name], [name] 06/08/2022 9:30 A M Medical Record Number: [id number] Patient Account Number: [id number] Date of Birth/Sex: Treating RN: August 26, 1950 (72 y.o. Michael [name] Primary Care Provider: Kae Heller Other Clinician: Referring Provider: Treating Provider/Extender: Tina Griffiths in Treatment: 26 Debridement Performed for Assessment: Wound #4 Right,Medial Upper Leg Performed By: Physician Duanne Guess, MD Debridement Type: Debridement Level of Consciousness (Pre-procedure): Awake and Alert Pre-procedure Verification/Time Out Yes - 09:50 Taken: Start Time: 09:50 Pain Control: Lidocaine 5% topical ointment T Area Debrided (L x W): otal 1.6 (cm) x 1.4 (cm) = 2.24 (cm) Tissue and other material debrided: Non-Viable, Eschar, Slough, Slough Level: Non-Viable Tissue Debridement Description: Selective/Open Wound Instrument: Curette Bleeding:  Minimum Hemostasis Achieved: Pressure End Time: 09:52 Procedural Pain: 0 Post Procedural Pain: 0 Response to Treatment: Procedure was tolerated well Level of Consciousness (Post- Awake and Alert procedure): Post Debridement Measurements of Total Wound Length: (cm) 1.6 Width: (cm) 1.4 Depth: (cm) 0.1 Volume: (cm) 0.176 Character of Wound/Ulcer Post Debridement: Improved Post Procedure Diagnosis Same as Pre-procedure Notes Scribed for Dr. Lady Gary by J.Scotton Electronic Signature(s) Signed: 06/08/2022 12:22:47 PM By: Duanne Guess MD FACS Signed: 06/08/2022 5:44:32 PM By: Karie Schwalbe RN Entered By:  Karie Schwalbe on 06/08/2022 10:00:07 -------------------------------------------------------------------------------- HPI Details Patient Name: Date of Service: HO FFMA Michael [name], Michael C. 06/08/2022 9:30 A M Medical Record Number: [id number] Patient Account Number: [id number] Date of Birth/Sex: Treating RN: 1950-03-19 (72 y.o. M) Primary Care Provider: Kae Heller Other Clinician: Referring Provider: Treating Provider/Extender: Tina Griffiths in Treatment: 26 History of Present Illness Location: right lateral calf closer to the knee Quality: Patient reports experiencing a dull pain to affected area(s). Severity: Patient states wound(s) are getting worse. Duration: Patient has had the wound for > 7 months prior to seeking treatment at the wound center Timing: Pain in wound is constant (hurts all the time) DELYLE, WEIDER ([id number]) [id number] Page 3 of 11 Context: The wound would happen gradually Modifying Factors: Patient wound(s)/ulcer(s) are worsening due to : no resolution and a white material at the base of the wound ssociated Signs and Symptoms: Patient reports having:no discharge or purulent material A HPI Description: 72 year old gentleman who has been referred to was from his PCP for a chronic ulceration on his right  lower extremity which she's had for several weeks. Past medical history significant for diabetes mellitus type 2, hypertension, hyperlipidemia, anxiety, obesity, peripheral vascular disease and chronic kidney disease. He has never been a smoker. Most recent lab work done at his PCPs office showed a glucose of 217 milligrams per deciliter which is consistent with hyperglycemia. In January 2017 recent hemoglobin A1c values noted to be 8.2%. In April 2017, he has been seen at the vascular office by Dr. Myra Gianotti and [name] for right leg claudication. He had a right superficial femoral artery stent in September 2012. Recent noninvasive vascular imaging done on 06/24/2015 showed a right ABI of 1.07 with a triphasic waveform and a right TBI of 0.81. The left was noncompressible with a triphasic waveform and a TBI of 1.17. Right lower extremity arterial duplex was unable to identify stent exit but they were elevated velocities at the stent and in the distal thigh with triphasic waveforms. [name] opinion was to return to the clinic in 6 months with ABI and right lower extremity arterial duplex studies to be done. He has seen a dermatologist who had done a biopsy and said it was benign and he was given a steroid cream. 10/23/2015 -- Pathology report of the biopsy done in April 2017 shows ulcer with underlying angiodermatitis consistent with stasis dermatitis. 10/30/2015 -- he is going for shoulder surgery in about 10 days' time and was asking about the perioperative care. His blood sugars are running in the high 100s or low 200s. No hemoglobin A1c done recently. 11/20/2015 -- is back up to 2 weeks because of recent left shoulder surgery. 12/04/15; patient returns today with the wound for the most part looking healthy. No evidence of infection no debridement required. He is using Prisma for 4 weeks, without obvious improvement per her intake nurse. This started as several small open areas that were  raised erythematous. He saw dermatology and at some point this was biopsied that just suggested stasis skin physiology. This certainly doesn't look like that 12/11/15; using Hydrafera Blue. Previous biopsy reviewed, no atypia PAS negative. He has a history of stents in the right leg however he does not appear to have primary arterial insufficiency ABI in this clinic was over 0.9. 12/25/2015 -- he has been approved for grafix and we will order for some to be applied next week 01/01/2016 -- he has had his first application of Grafix today  01/08/2016 -- he has had his second application of Grafix today READMISSION 12/04/2021 This is a now 72 year old man who was followed in our clinic about 5 years ago for an ulceration on his right lateral lower leg, just distal to the knee. He has since undergone a number of revascularization procedures that have been complicated by restenosis and wound infections. During the course of this treatment, he developed a small ulcer on his right anterior tibial surface. Despite use of an Unna boot, the wound has continued to expand. He was referred to the wound care center by Dr. Myra Gianotti for further evaluation and management. On the right anterior tibial surface, there is an irregular wound with heavy slough and eschar accumulation. The periwound skin is intact but he does have 1-2+ pitting edema. There is no purulent drainage or malodor. 10/13; second visit for this man who has an ischemic wound in the setting of type 2 diabetes on the right anterior mid tibia area. He has been revascularized. Using Santyl gent 12/22/2021: The wound is about the same size this week. There is a lot of gray sloughy material on the surface, secondary to silver nitrate used to stop bleeding after what sounds like a fairly aggressive debridement last week. 12/30/2021: The wound is smaller this week and a bit cleaner. Edema control is excellent. There is still a bit of slough  accumulation. 01/07/2022: The wound continues to contract and is much cleaner this week. Edema control is very good. He has small openings in his upper leg surgical scars, but he is going to be seeing Dr. Myra Gianotti next Monday and will have him take a look. He has been applying manuka honey to both of the sites. We have been using Santyl and gentamicin on his lower leg wound. 01/14/2022: He saw vascular surgery this week and was told that they were happy to have [place] manage the 2 wounds from his operation. Both of these have a light layer of slough on the surface. The more distal wound is a little bit dry. The anterior tibial wound continues to accumulate a fair amount of slough. Edema control is good. 01/19/2022: Both upper leg wounds are smaller. The more distal is quite dry and cracked when I was examining it. The lower leg wound is more painful and the surrounding tissue is erythematous and indurated. 01/26/2022: The upper leg wounds are healed. The skin at the distal leg wound is quite dry. The lower leg wound is cleaner but still fairly painful. His wrap slid again this week. 02/02/2022: We had good success keeping his wrap intact using a standard Unna boot. The lower leg wound continues to contract and is clean and less painful this week. 12/11; TheraSkin #1 12/18; TheraSkin #2. Wound looks as though it is contracted. Patient states that his pain was better in the leg. Secondary dressing and kerlix Coban 02/24/2022: The wound is smaller since I saw it last. It is less painful and there is good granulation tissue, including over the exposed tendon. 03/09/2022: The wound continues to contract. There is continued fill of the wound cavity with granulation tissue. 03/16/2022: The wound is smaller again today. There is good granulation tissue forming, covering the tendon. 03/30/2022: The wound continues to contract and fill with good granulation tissue. The granulation tissue is oozing a little bit at the  lateral edge. 04/13/2022: We left his TheraSkin on an extra week because it was looking so good. The wound is smaller this week and flush with the surrounding  skin. His medial thigh wound has opened up again. There is slough and eschar present. 04/20/2022: The anterior tibial wound continues to contract. There is good granulation tissue with just a light layer of slough. The medial thigh ulcer is superficial with slough present. It is a bit fibrotic due to being in a scar. 04/27/2022: The anterior tibial wound continues to contract. There is still some slough buildup with good granulation tissue. The medial thigh ulcer is very leathery and dry. 05/04/2022: The anterior tibial wound is even smaller today. There is no visible tendon at all. He has some slough and eschar accumulation. The medial thigh ulcer continues to be quite dry with a leathery surface. Karie MainlandHOFFMAN, Avis C ([id number]) [id number] Page 4 of 11 05/11/2022: The anterior tibial wound continues to contract. There is a little bit of eschar and slough buildup. The granulation tissue is also a bit hypertrophic. The medial thigh ulcer is dry and leathery, once again. 05/25/2022: The anterior tibial wound is quite a bit smaller this week. There is a little bit of eschar and slough. No reaccumulation of hypertrophic granulation tissue. The medial thigh ulcer has slightly improved tissue quality. It is still somewhat fibrotic, but there is at least a light pink color to the tissue. 06/01/2022: Both wounds are smaller. There is hypertrophic granulation tissue on the anterior tibial wound. The quality of the tissue continues to improve on the medial thigh ulcer. 06/08/2022: The anterior tibial wound got a little bit macerated around the edges and the wound measures a bit larger; there is still some hypertrophic granulation tissue present. The medial thigh ulcer is measuring smaller and the quality of tissue continues to improve.  There is some slough and eschar present. Electronic Signature(s) Signed: 06/08/2022 10:01:22 AM By: Duanne Guessannon, Christion Leonhard MD FACS Entered By: Duanne Guessannon, Jacorie Ernsberger on 06/08/2022 10:01:21 -------------------------------------------------------------------------------- Physical Exam Details Patient Name: Date of Service: [name], [name] 06/08/2022 9:30 A M Medical Record Number: [id number] Patient Account Number: [id number] Date of Birth/Sex: Treating RN: 08-06-1950 (72 y.o. M) Primary Care Provider: Kae HellerStamey, Hannah Other Clinician: Referring Provider: Treating Provider/Extender: Tina Griffithsannon, Anice Wilshire Stamey, Hannah Weeks in Treatment: 26 Constitutional . . . . no acute distress. Respiratory Normal work of breathing on room air. Notes 06/08/2022: The anterior tibial wound got a little bit macerated around the edges and the wound measures a bit larger; there is still some hypertrophic granulation tissue present. The medial thigh ulcer is measuring smaller and the quality of tissue continues to improve. There is some slough and eschar present. Electronic Signature(s) Signed: 06/08/2022 10:02:05 AM By: Duanne Guessannon, Pat Sires MD FACS Entered By: Duanne Guessannon, Alecxander Mainwaring on 06/08/2022 10:02:05 -------------------------------------------------------------------------------- Physician Orders Details Patient Name: Date of Service: [name], [name] 06/08/2022 9:30 A M Medical Record Number: [id number] Patient Account Number: [id number] Date of Birth/Sex: Treating RN: 08-06-1950 (72 y.o. Michael LimboM) Scotton, Joanne Primary Care Provider: Kae HellerStamey, Hannah Other Clinician: Referring Provider: Treating Provider/Extender: Tina Griffithsannon, Evgenia Merriman Stamey, Hannah Weeks in Treatment: 7626 Verbal / Phone Orders: No Diagnosis Coding ICD-10 Coding Code Description [id number] 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 I70.239 Atherosclerosis of native arteries of right leg with  ulceration of unspecified site E11.622 Type 2 diabetes mellitus with other skin ulcer I73.9 Peripheral vascular disease, unspecified L97.122 Non-pressure chronic ulcer of left thigh with fat layer exposed Follow-up Appointments ppointment in 1 week. - Dr. Lady Garyannon - Room 3 Return A Anesthetic Wound #3 Right,Anterior Lower Leg (In clinic)  Topical Lidocaine 4% applied to wound bed Edema Control - Lymphedema / SCD / Other ARYEH, BUTTERFIELD ([id number]) [id number] Page 5 of 11 Right Lower Extremity Avoid standing for long periods of time. Additional Orders / Instructions Other: - Doctor's note to return back to work 05/04/22 Wound Treatment Wound #3 - Lower Leg Wound Laterality: Right, Anterior Cleanser: Soap and Water 1 x Per Week/30 Days Discharge Instructions: May shower and wash wound with dial antibacterial soap and water prior to dressing change. Cleanser: Wound Cleanser 1 x Per Week/30 Days Discharge Instructions: Cleanse the wound with wound cleanser prior to applying a clean dressing using gauze sponges, not tissue or cotton balls. Peri-Wound Care: Zinc Oxide Ointment 30g tube 1 x Per Week/30 Days Discharge Instructions: Apply Zinc Oxide to periwound with each dressing change Peri-Wound Care: Sween Lotion (Moisturizing lotion) 1 x Per Week/30 Days Discharge Instructions: Apply moisturizing lotion as directed Prim Dressing: Hydrofera Blue Ready Transfer Foam, 2.5x2.5 (in/in) 1 x Per Week/30 Days ary Discharge Instructions: Apply directly to wound bed as directed Secondary Dressing: ABD Pad, 8x10 1 x Per Week/30 Days Discharge Instructions: Apply over primary dressing as directed. Secondary Dressing: Woven Gauze Sponge, Non-Sterile 4x4 in 1 x Per Week/30 Days Discharge Instructions: Apply over primary dressing as directed. Secured With: [employer] Size 5, 10 (yds) 1 x Per Week/30 Days Compression Wrap: Kerlix Roll 4.5x3.1 (in/yd) 1 x Per Week/30  Days Discharge Instructions: Apply Kerlix and Coban compression as directed. Compression Wrap: Coban Self-Adherent Wrap 4x5 (in/yd) 1 x Per Week/30 Days Discharge Instructions: Apply over Kerlix as directed. Compression Wrap: Unnaboot w/Calamine, 4x10 (in/yd) 1 x Per Week/30 Days Discharge Instructions: Apply Unnaboot to top of dressing and around ankles to prevent dressing from sliding Wound #4 - Upper Leg Wound Laterality: Right, Medial Cleanser: Soap and Water 1 x Per Week/30 Days Discharge Instructions: May shower and wash wound with dial antibacterial soap and water prior to dressing change. Cleanser: Wound Cleanser 1 x Per Week/30 Days Discharge Instructions: Cleanse the wound with wound cleanser prior to applying a clean dressing using gauze sponges, not tissue or cotton balls. Prim Dressing: Endoform 2x2 in 1 x Per Week/30 Days ary Discharge Instructions: Moisten with hydrogel Secondary Dressing: Zetuvit Plus Silicone Border Dressing 4x4 (in/in) 1 x Per Week/30 Days Discharge Instructions: Apply silicone border over primary dressing as directed. Electronic Signature(s) Signed: 06/08/2022 12:22:47 PM By: Duanne Guess MD FACS Entered By: Duanne Guess on 06/08/2022 10:02:27 -------------------------------------------------------------------------------- Problem List Details Patient Name: Date of Service: [name], [name] 06/08/2022 9:30 A M Medical Record Number: [id number] Patient Account Number: [id number] Date of Birth/Sex: Treating RN: [birth year] (72 y.o. M) Primary Care Provider: Kae Heller Other Clinician: Referring Provider: Treating Provider/Extender: Tina Griffiths in Treatment: 26 Active Problems ICD-10 Encounter Code Description Active Date MDM HYATT, CAPOBIANCO ([id number]) [id number] Page 6 of 11 Code Description Active Date MDM Diagnosis L97.815 Non-pressure chronic ulcer of other part of right  lower leg with muscle 12/04/2021 No Yes involvement without evidence of necrosis I70.239 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 12/04/2021 No Yes E11.622 Type 2 diabetes mellitus with other skin ulcer 12/04/2021 No Yes I73.9 Peripheral vascular disease, unspecified 12/04/2021 No Yes L97.122 Non-pressure chronic ulcer of left thigh with fat layer exposed 01/14/2022 No Yes Inactive Problems Resolved Problems Electronic Signature(s) Signed: 06/08/2022 10:00:02 AM By: Duanne Guess MD FACS Entered By: Duanne Guess on 06/08/2022 10:00:02 -------------------------------------------------------------------------------- Progress Note Details Patient  Name: Date of Service: [name], Michael C. 06/08/2022 9:30 A M Medical Record Number: [id number] Patient Account Number: [id number] Date of Birth/Sex: Treating RN: Dec 26, 1950 (72 y.o. M) Primary Care Provider: Kae Heller Other Clinician: Referring Provider: Treating Provider/Extender: Tina Griffiths in Treatment: 26 Subjective Chief Complaint Information obtained from Patient Patient presents to the wound care center today with an open arterial ulcer to the right lower extremity in the setting of diabetes mellitus. he has had this problem for 7 months 12/04/2021: ulcer to right lower anterior tibial surface History of Present Illness (HPI) The following HPI elements were documented for the patient's wound: Location: right lateral calf closer to the knee Quality: Patient reports experiencing a dull pain to affected area(s). Severity: Patient states wound(s) are getting worse. Duration: Patient has had the wound for > 7 months prior to seeking treatment at the wound center Timing: Pain in wound is constant (hurts all the time) Context: The wound would happen gradually Modifying Factors: Patient wound(s)/ulcer(s) are worsening due to : no resolution and a white material at the  base of the wound Associated Signs and Symptoms: Patient reports having:no discharge or purulent material 72 year old gentleman who has been referred to was from his PCP for a chronic ulceration on his right lower extremity which she's had for several weeks. Past medical history significant for diabetes mellitus type 2, hypertension, hyperlipidemia, anxiety, obesity, peripheral vascular disease and chronic kidney disease. He has never been a smoker. Most recent lab work done at his PCPs office showed a glucose of 217 milligrams per deciliter which is consistent with hyperglycemia. In January 2017 recent hemoglobin A1c values noted to be 8.2%. In April 2017, he has been seen at the vascular office by Dr. Myra Gianotti and [name] for right leg claudication. He had a right superficial femoral artery stent in September 2012. Recent noninvasive vascular imaging done on 06/24/2015 showed a right ABI of 1.07 with a triphasic waveform and a right TBI of 0.81. The left was noncompressible with a triphasic waveform and a TBI of 1.17. Right lower extremity arterial duplex was unable to identify stent exit but they were elevated velocities at the stent and in the distal thigh with triphasic waveforms. [name] opinion was to return to the clinic in 6 months with ABI and right lower extremity arterial duplex studies to be done. He has seen a dermatologist who had done a biopsy and said it was benign and he was given a steroid cream. 10/23/2015 -- Pathology report of the biopsy done in April 2017 shows ulcer with underlying angiodermatitis consistent with stasis dermatitis. 10/30/2015 -- he is going for shoulder surgery in about 10 days' time and was asking about the perioperative care. His blood sugars are running in the high 100s or low 200s. No hemoglobin A1c done recently. 11/20/2015 -- is back up to 2 weeks because of recent left shoulder surgery. 12/04/15; patient returns today with the wound for the most  part looking healthy. No evidence of infection no debridement required. He is using Prisma for 4 weeks, without obvious improvement per her intake nurse. This started as several small open areas that were raised erythematous. He saw dermatology and at some point this was biopsied that just suggested stasis skin physiology. This certainly doesn't look like that BENJAMIN, CASANAS ([id number]) [id number] Page 7 of 11 12/11/15; using Hydrafera Blue. Previous biopsy reviewed, no atypia PAS negative. He has a history of stents in the right leg however  he does not appear to have primary arterial insufficiency ABI in this clinic was over 0.9. 12/25/2015 -- he has been approved for grafix and we will order for some to be applied next week 01/01/2016 -- he has had his first application of Grafix today 01/08/2016 -- he has had his second application of Grafix today READMISSION 12/04/2021 This is a now 72 year old man who was followed in our clinic about 5 years ago for an ulceration on his right lateral lower leg, just distal to the knee. He has since undergone a number of revascularization procedures that have been complicated by restenosis and wound infections. During the course of this treatment, he developed a small ulcer on his right anterior tibial surface. Despite use of an Unna boot, the wound has continued to expand. He was referred to the wound care center by Dr. Myra Gianotti for further evaluation and management. On the right anterior tibial surface, there is an irregular wound with heavy slough and eschar accumulation. The periwound skin is intact but he does have 1-2+ pitting edema. There is no purulent drainage or malodor. 10/13; second visit for this man who has an ischemic wound in the setting of type 2 diabetes on the right anterior mid tibia area. He has been revascularized. Using Santyl gent 12/22/2021: The wound is about the same size this week. There is a lot of  gray sloughy material on the surface, secondary to silver nitrate used to stop bleeding after what sounds like a fairly aggressive debridement last week. 12/30/2021: The wound is smaller this week and a bit cleaner. Edema control is excellent. There is still a bit of slough accumulation. 01/07/2022: The wound continues to contract and is much cleaner this week. Edema control is very good. He has small openings in his upper leg surgical scars, but he is going to be seeing Dr. Myra Gianotti next Monday and will have him take a look. He has been applying manuka honey to both of the sites. We have been using Santyl and gentamicin on his lower leg wound. 01/14/2022: He saw vascular surgery this week and was told that they were happy to have [place] manage the 2 wounds from his operation. Both of these have a light layer of slough on the surface. The more distal wound is a little bit dry. The anterior tibial wound continues to accumulate a fair amount of slough. Edema control is good. 01/19/2022: Both upper leg wounds are smaller. The more distal is quite dry and cracked when I was examining it. The lower leg wound is more painful and the surrounding tissue is erythematous and indurated. 01/26/2022: The upper leg wounds are healed. The skin at the distal leg wound is quite dry. The lower leg wound is cleaner but still fairly painful. His wrap slid again this week. 02/02/2022: We had good success keeping his wrap intact using a standard Unna boot. The lower leg wound continues to contract and is clean and less painful this week. 12/11; TheraSkin #1 12/18; TheraSkin #2. Wound looks as though it is contracted. Patient states that his pain was better in the leg. Secondary dressing and kerlix Coban 02/24/2022: The wound is smaller since I saw it last. It is less painful and there is good granulation tissue, including over the exposed tendon. 03/09/2022: The wound continues to contract. There is continued fill of the wound  cavity with granulation tissue. 03/16/2022: The wound is smaller again today. There is good granulation tissue forming, covering the tendon. 03/30/2022: The wound continues  to contract and fill with good granulation tissue. The granulation tissue is oozing a little bit at the lateral edge. 04/13/2022: We left his TheraSkin on an extra week because it was looking so good. The wound is smaller this week and flush with the surrounding skin. His medial thigh wound has opened up again. There is slough and eschar present. 04/20/2022: The anterior tibial wound continues to contract. There is good granulation tissue with just a light layer of slough. The medial thigh ulcer is superficial with slough present. It is a bit fibrotic due to being in a scar. 04/27/2022: The anterior tibial wound continues to contract. There is still some slough buildup with good granulation tissue. The medial thigh ulcer is very leathery and dry. 05/04/2022: The anterior tibial wound is even smaller today. There is no visible tendon at all. He has some slough and eschar accumulation. The medial thigh ulcer continues to be quite dry with a leathery surface. 05/11/2022: The anterior tibial wound continues to contract. There is a little bit of eschar and slough buildup. The granulation tissue is also a bit hypertrophic. The medial thigh ulcer is dry and leathery, once again. 05/25/2022: The anterior tibial wound is quite a bit smaller this week. There is a little bit of eschar and slough. No reaccumulation of hypertrophic granulation tissue. The medial thigh ulcer has slightly improved tissue quality. It is still somewhat fibrotic, but there is at least a light pink color to the tissue. 06/01/2022: Both wounds are smaller. There is hypertrophic granulation tissue on the anterior tibial wound. The quality of the tissue continues to improve on the medial thigh ulcer. 06/08/2022: The anterior tibial wound got a little bit macerated around the edges  and the wound measures a bit larger; there is still some hypertrophic granulation tissue present. The medial thigh ulcer is measuring smaller and the quality of tissue continues to improve. There is some slough and eschar present. Patient History Information obtained from Patient. Family History Cancer - Siblings, Heart Disease - Father, Hypertension - Father,Mother, No family history of Hereditary Spherocytosis, Kidney Disease, Seizures, Stroke, Thyroid Problems, Tuberculosis. Social History Former smoker - smokeless tobacco, Marital Status - Married, Alcohol Use - Rarely - WINE, Drug Use - No History, Caffeine Use - Daily - COFFEE. Medical History ANUAR, WALGREN ([id number]) [id number] Page 8 of 11 Ear/Nose/Mouth/Throat Patient has history of Chronic sinus problems/congestion - seasonal allergies Cardiovascular Patient has history of Hypertension, Peripheral Arterial Disease - STENTS, Peripheral Venous Disease Endocrine Patient has history of Type II Diabetes - last A1c- 8.2 Hospitalization/Surgery History - left shoulder surgery. Medical A Surgical History Notes nd Constitutional Symptoms (General Health) obesity , h/o (R) leg claudication (right fem stent September 2012) Cardiovascular hyperlipidemia Endocrine pt. on diet intentionally losing 20 pounds since December 2016 in an effort to improve A1C levels Integumentary (Skin) psoriatic arthritis Musculoskeletal bilat knee pain identified as an ortho issue psoriatic arthritis Oncologic Prostate cancer 2018 Psychiatric anxiety Objective Constitutional no acute distress. Vitals Time Taken: 9:32 AM, Height: 72 in, Weight: 208 lbs, BMI: 28.2, Temperature: 98.4 F, Pulse: 81 bpm, Respiratory Rate: 18 breaths/min, Blood Pressure: 138/81 mmHg. Respiratory Normal work of breathing on room air. General Notes: 06/08/2022: The anterior tibial wound got a little bit macerated around the edges and the  wound measures a bit larger; there is still some hypertrophic granulation tissue present. The medial thigh ulcer is measuring smaller and the quality of tissue continues to improve. There is some slough and eschar present.  Integumentary (Hair, Skin) Wound #3 status is Open. Original cause of wound was Blister. The date acquired was: 09/30/2021. The wound has been in treatment 26 weeks. The wound is located on the Right,Anterior Lower Leg. The wound measures 2cm length x 1cm width x 0.1cm depth; 1.571cm^2 area and 0.157cm^3 volume. There is no tunneling or undermining noted. There is a medium amount of serosanguineous drainage noted. The wound margin is distinct with the outline attached to the wound base. There is large (67-100%) red, friable, hyper - granulation within the wound bed. There is a small (1-33%) amount of necrotic tissue within the wound bed including Adherent Slough. The periwound skin appearance did not exhibit: Callus, Crepitus, Excoriation, Induration, Rash, Scarring, Dry/Scaly, Maceration, Atrophie Blanche, Cyanosis, Ecchymosis, Hemosiderin Staining, Mottled, Pallor, Rubor, Erythema. Periwound temperature was noted as No Abnormality. Wound #4 status is Open. Original cause of wound was Gradually Appeared. The date acquired was: 01/05/2022. The wound has been in treatment 21 weeks. The wound is located on the Right,Medial Upper Leg. The wound measures 1.6cm length x 1.4cm width x 0.1cm depth; 1.759cm^2 area and 0.176cm^3 volume. There is Fat Layer (Subcutaneous Tissue) exposed. There is no tunneling or undermining noted. There is a medium amount of serosanguineous drainage noted. The wound margin is distinct with the outline attached to the wound base. There is large (67-100%) pink granulation within the wound bed. There is a small (1- 33%) amount of necrotic tissue within the wound bed including Eschar and Adherent Slough. The periwound skin appearance had no abnormalities noted  for moisture. The periwound skin appearance had no abnormalities noted for color. The periwound skin appearance exhibited: Scarring. Periwound temperature was noted as No Abnormality. 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 There was a Selective/Open Wound Non-Viable Tissue Debridement with a total area of 2 sq cm performed by Duanne Guess, MD. With the following instrument(s): Curette to remove Non-Viable tissue/material. Material removed includes [facility] after achieving pain control using Lidocaine 5% topical ointment. No specimens were taken. A time out was conducted at 09:50, prior to the start of the procedure. A Minimum amount of bleeding was controlled with Silver Nitrate. The procedure was tolerated well with a pain level of 0 throughout and a pain level of 0 following the procedure. Post Debridement Measurements: 2cm length x 1cm width x 0.1cm depth; 0.157cm^3 volume. Character of Wound/Ulcer Post Debridement is improved. Severity of Tissue Post Debridement is: Fat layer exposed. Post procedure Diagnosis Wound #3: Same as Pre-Procedure General Notes: Scribed for Dr. Lady Gary by J.Scotton. Wound #4 Pre-procedure diagnosis of Wound #4 is a Dehisced Wound located on the Right,Medial Upper Leg . There was a Selective/Open Wound Non-Viable Tissue Debridement with a  total area of 2.24 sq cm performed by Duanne Guess, MD. With the following instrument(s): Curette to remove Non-Viable tissue/material. Material removed includes Eschar and Slough and after achieving pain control using Lidocaine 5% topical ointment. No specimens were taken. A time out was conducted at 09:50, prior  to the start of the procedure. A Minimum amount of bleeding was controlled with Pressure. The procedure was tolerated well with a pain level of 0 throughout and a pain level of 0 following the procedure. Post Debridement Measurements: 1.6cm length x 1.4cm width x 0.1cm depth; 0.176cm^3 volume. Character of Wound/Ulcer Post Debridement is improved. Post procedure Diagnosis Wound #4: Same as Pre-Procedure General Notes: Scribed for Dr. Lady Gary by J.Scotton. Plan Follow-up Appointments: Return Appointment in 1 week. - Dr. Lady Gary - Room 3 Anesthetic: Wound #3 Right,Anterior Lower Leg: (In clinic) Topical Lidocaine 4% applied to wound bed Edema Control - Lymphedema / SCD / Other: Avoid standing for long periods of time. Additional Orders / Instructions: Other: - Doctor's note to return back to work 05/04/22 WOUND #3: - Lower Leg Wound Laterality: Right, Anterior Cleanser: Soap and Water 1 x Per Week/30 Days Discharge Instructions: May shower and wash wound with dial antibacterial soap and water prior to dressing change. Cleanser: Wound Cleanser 1 x Per Week/30 Days Discharge Instructions: Cleanse the wound with wound cleanser prior to applying a clean dressing using gauze sponges, not tissue or cotton balls. Peri-Wound Care: Zinc Oxide Ointment 30g tube 1 x Per Week/30 Days Discharge Instructions: Apply Zinc Oxide to periwound with each dressing change Peri-Wound Care: Sween Lotion (Moisturizing lotion) 1 x Per Week/30 Days Discharge Instructions: Apply moisturizing lotion as directed Prim Dressing: Hydrofera Blue Ready Transfer Foam, 2.5x2.5 (in/in) 1 x Per Week/30  Days ary Discharge Instructions: Apply directly to wound bed as directed Secondary Dressing: ABD Pad, 8x10 1 x Per Week/30 Days Discharge Instructions: Apply over primary dressing as directed. Secondary Dressing: Woven Gauze Sponge, Non-Sterile 4x4 in 1 x Per Week/30 Days Discharge Instructions: Apply over primary dressing as directed. Secured With: [employer] Size 5, 10 (yds) 1 x Per Week/30 Days Com pression Wrap: Kerlix Roll 4.5x3.1 (in/yd) 1 x Per Week/30 Days Discharge Instructions: Apply Kerlix and Coban compression as directed. Com pression Wrap: Coban Self-Adherent Wrap 4x5 (in/yd) 1 x Per Week/30 Days Discharge Instructions: Apply over Kerlix as directed. Com pression Wrap: Unnaboot w/Calamine, 4x10 (in/yd) 1 x Per Week/30 Days Discharge Instructions: Apply Unnaboot to top of dressing and around ankles to prevent dressing from sliding WOUND #4: - Upper Leg Wound Laterality: Right, Medial Cleanser: Soap and Water 1 x Per Week/30 Days Discharge Instructions: May shower and wash wound with dial antibacterial soap and water prior to dressing change. Cleanser: Wound Cleanser 1 x Per Week/30 Days Discharge Instructions: Cleanse the wound with wound cleanser prior to applying a clean dressing using gauze sponges, not tissue or cotton balls. Prim Dressing: Endoform 2x2 in 1 x Per Week/30 Days ary Discharge Instructions: Moisten with hydrogel Secondary Dressing: Zetuvit Plus Silicone Border Dressing 4x4 (in/in) 1 x Per Week/30 Days Discharge Instructions: Apply silicone border over primary dressing as directed. 06/08/2022: The anterior tibial wound got a little bit macerated around the edges and the wound measures a bit larger; there is still some hypertrophic granulation tissue present. The medial thigh ulcer is measuring smaller and the quality of tissue continues to improve. There is some slough and eschar present. I used a curette to debride slough from the anterior tibial wound and then  chemically cauterized the hypertrophic granulation tissue with silver nitrate. I debrided slough and eschar from the medial thigh wound. I am going to change the anterior tibial wound contact layer to [facility] Blue ready foam and continue endoform with hydrogel on the medial thigh ulcer. Kerlix and Coban  compression. Follow-up in 1 week. Electronic Signature(s) Signed: 06/08/2022 10:03:07 AM By: Duanne Guess MD FACS Entered By: Duanne Guess on 06/08/2022 10:03:07 [name] ([id number]) [id number] Page 10 of 11 -------------------------------------------------------------------------------- HxROS Details Patient Name: Date of Service: [name], Michael C. 06/08/2022 9:30 A M Medical Record Number: [id number] Patient Account Number: [id number] Date of Birth/Sex: Treating RN: August 18, 1950 (72 y.o. M) Primary Care Provider: Kae Heller Other Clinician: Referring Provider: Treating Provider/Extender: Tina Griffiths in Treatment: 26 Information Obtained From Patient Constitutional Symptoms (General Health) Medical History: Past Medical History Notes: obesity , h/o (R) leg claudication (right fem stent September 2012) Ear/Nose/Mouth/Throat Medical History: Positive for: Chronic sinus problems/congestion - seasonal allergies Cardiovascular Medical History: Positive for: Hypertension; Peripheral Arterial Disease - STENTS; Peripheral Venous Disease Past Medical History Notes: hyperlipidemia Endocrine Medical History: Positive for: Type II Diabetes - last A1c- 8.2 Past Medical History Notes: pt. on diet intentionally losing 20 pounds since December 2016 in an effort to improve A1C levels Time with diabetes: 9 YRS Treated with: Insulin Blood sugar tested every day: Yes Tested : 7-8 TIMES Integumentary (Skin) Medical History: Past Medical History Notes: psoriatic arthritis Musculoskeletal Medical History: Past Medical  History Notes: bilat knee pain identified as an ortho issue psoriatic arthritis Oncologic Medical History: Past Medical History Notes: Prostate cancer 2018 Psychiatric Medical History: Past Medical History Notes: anxiety HBO Extended History Items Ear/Nose/Mouth/Throat: Chronic sinus problems/congestion Immunizations Pneumococcal Vaccine: Received Pneumococcal Vaccination: Yes Received Pneumococcal Vaccination On or After 60th Birthday: Yes Implantable Devices TAIVEN, GREENLEY ([id number]) [id number].pdf Page 11 of 11 None Hospitalization / Surgery History Type of Hospitalization/Surgery left shoulder surgery Family and Social History Cancer: Yes - Siblings; Heart Disease: Yes - Father; Hereditary Spherocytosis: No; Hypertension: Yes - Father,Mother; Kidney Disease: No; Seizures: No; Stroke: No; Thyroid Problems: No; Tuberculosis: No; Former smoker - smokeless tobacco; Marital Status - Married; Alcohol Use: Rarely - WINE; Drug Use: No History; Caffeine Use: Daily - COFFEE; Financial Concerns: No; Food, Clothing or Shelter Needs: No; Support System Lacking: No; Transportation Concerns: No Electronic Signature(s) Signed: 06/08/2022 12:22:47 PM By: Duanne Guess MD FACS Entered By: Duanne Guess on 06/08/2022 10:01:30 -------------------------------------------------------------------------------- SuperBill Details Patient Name: Date of Service: [name], [name] 06/08/2022 Medical Record Number: [id number] Patient Account Number: [id number] Date of Birth/Sex: Treating RN: 09/15/1950 (72 y.o. 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 I70.239 Atherosclerosis of  native arteries of right leg with ulceration of unspecified site E11.622 Type 2 diabetes mellitus with other skin ulcer I73.9 Peripheral vascular disease, unspecified L97.122 Non-pressure chronic ulcer of left thigh with fat layer exposed Facility Procedures : CPT4 Code Description: [id number] - DEBRIDE WOUND 1ST 20 SQ CM OR < ICD-10 Diagnosis Description L97.815 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 involvemen L97.122 Non-pressure chronic ulcer of left thigh with fat  layer exposed Modifier: t without evidence Quantity: 1 of necrosis Physician Procedures : CPT4 Code Description Modifier [id number] - WC PHYS LEVEL 3 - EST PT 25 ICD-10 Diagnosis Description L97.815 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 without evidence L97.122 Non-pressure chronic ulcer of  left thigh with fat layer exposed I70.239 Atherosclerosis of native arteries of right leg with ulceration  of unspecified site E11.622 Type 2 diabetes mellitus with other skin ulcer Quantity: 1 of necrosis : [id number] - WC PHYS DEBR WO ANESTH 20 SQ CM ICD-10 Diagnosis Description L97.815 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 without evidence L97.122 Non-pressure chronic ulcer of left thigh with fat layer  exposed Quantity: 1 of necrosis Electronic Signature(s) Signed: 06/08/2022 10:03:33 AM By: Duanne Guess MD FACS Entered By: Duanne Guess on 06/08/2022 10:03:33

## 2022-06-15 ENCOUNTER — Encounter (HOSPITAL_BASED_OUTPATIENT_CLINIC_OR_DEPARTMENT_OTHER): Payer: Medicare Other | Admitting: General Surgery

## 2022-06-15 ENCOUNTER — Ambulatory Visit (HOSPITAL_BASED_OUTPATIENT_CLINIC_OR_DEPARTMENT_OTHER): Payer: Medicare Other | Admitting: General Surgery

## 2022-06-15 DIAGNOSIS — L97812 Non-pressure chronic ulcer of other part of right lower leg with fat layer exposed: Secondary | ICD-10-CM | POA: Diagnosis not present

## 2022-06-15 DIAGNOSIS — L97815 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: Secondary | ICD-10-CM | POA: Diagnosis not present

## 2022-06-15 DIAGNOSIS — E11622 Type 2 diabetes mellitus with other skin ulcer: Secondary | ICD-10-CM | POA: Diagnosis not present

## 2022-06-15 DIAGNOSIS — I70239 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: Secondary | ICD-10-CM | POA: Diagnosis not present

## 2022-06-15 DIAGNOSIS — E1151 Type 2 diabetes mellitus with diabetic peripheral angiopathy without gangrene: Secondary | ICD-10-CM | POA: Diagnosis not present

## 2022-06-15 DIAGNOSIS — L97122 Non-pressure chronic ulcer of left thigh with fat layer exposed: Secondary | ICD-10-CM | POA: Diagnosis not present

## 2022-06-15 DIAGNOSIS — T8131XA Disruption of external operation (surgical) wound, not elsewhere classified, initial encounter: Secondary | ICD-10-CM | POA: Diagnosis not present

## 2022-06-15 NOTE — Progress Notes (Signed)
Michael [name], Michael [name] ([id number]) [id number] Page 1 of 12 Visit Report for 06/15/2022 Chief Complaint Document Details Patient Name: Date of Service: HO FFMA [place], [place] [name]. 06/15/2022 9:15 A M Medical Record Number: [id number] Patient Account Number: [id number] Date of Birth/Sex: Treating RN: [birth year] (72 y.o. M) Primary Care Provider: Kae Heller Other Clinician: Referring Provider: Treating Provider/Extender: Tina Griffiths in Treatment: 27 Information Obtained from: Patient Chief Complaint Patient presents to the wound care center today with an open arterial ulcer to the right lower extremity in the setting of diabetes mellitus. he has had this problem for 7 months 12/04/2021: ulcer to right lower anterior tibial surface Electronic Signature(s) Signed: 06/15/2022 9:50:48 AM By: Duanne Guess MD FACS Entered By: Duanne Guess on 06/15/2022 09:50:48 -------------------------------------------------------------------------------- Debridement Details Patient Name: Date of Service: [name], [name] 06/15/2022 9:15 A M Medical Record Number: [id number] Patient Account Number: [id number] Date of Birth/Sex: Treating RN: [birth year] (72 y.o. [name] Primary Care Provider: Kae Heller Other Clinician: Referring Provider: Treating Provider/Extender: Tina Griffiths in Treatment: 27 Debridement Performed for Assessment: Wound #3 Right,Anterior Lower Leg Performed By: Physician Duanne Guess, MD Debridement Type: Debridement Severity of Tissue Pre Debridement: Fat layer exposed Level of Consciousness (Pre-procedure): Awake and Alert Pre-procedure Verification/Time Out Yes - 09:44 Taken: Start Time: 09:44 Pain Control: Lidocaine 4% T opical Solution T Area Debrided (L x W): otal 1.9 (cm) x 1 (cm) = 1.9 (cm) Tissue and other material debrided: Non-Viable, Slough, Slough Level: Non-Viable  Tissue Debridement Description: Selective/Open Wound Instrument: Curette Bleeding: Minimum Hemostasis Achieved: Pressure Response to Treatment: Procedure was tolerated well Level of Consciousness (Post- Awake and Alert procedure): Post Debridement Measurements of Total Wound Length: (cm) 1.9 Width: (cm) 1 Depth: (cm) 0.1 Volume: (cm) 0.149 Character of Wound/Ulcer Post Debridement: Improved Severity of Tissue Post Debridement: Fat layer exposed Post Procedure Diagnosis Same as Pre-procedure Notes scribed for Dr. Lady Gary by Samuella Bruin, RN Electronic Signature(s) Signed: 06/15/2022 9:55:45 AM By: Duanne Guess MD FACS Michael [name] ([id number]) [id number] Page 2 of 12 Signed: 06/15/2022 3:09:24 PM By: Samuella Bruin Entered By: Samuella Bruin on 06/15/2022 09:44:46 -------------------------------------------------------------------------------- Debridement Details Patient Name: Date of Service: [name], [name] 06/15/2022 9:15 A M Medical Record Number: [id number] Patient Account Number: [id number] Date of Birth/Sex: Treating RN: November 20, 1950 (72 y.o. [name] Primary Care Provider: Kae Heller Other Clinician: Referring Provider: Treating Provider/Extender: Tina Griffiths in Treatment: 27 Debridement Performed for Assessment: Wound #4 Right,Medial Upper Leg Performed By: Physician Duanne Guess, MD Debridement Type: Debridement Level of Consciousness (Pre-procedure): Awake and Alert Pre-procedure Verification/Time Out Yes - 09:44 Taken: Start Time: 09:44 Pain Control: Lidocaine 4% T opical Solution T Area Debrided (L x W): otal 1.8 (cm) x 1.4 (cm) = 2.52 (cm) Tissue and other material debrided: Non-Viable, Eschar, Slough, Slough Level: Non-Viable Tissue Debridement Description: Selective/Open Wound Instrument: Curette Bleeding: Minimum Hemostasis Achieved: Pressure Response to  Treatment: Procedure was tolerated well Level of Consciousness (Post- Awake and Alert procedure): Post Debridement Measurements of Total Wound Length: (cm) 1.8 Width: (cm) 1.4 Depth: (cm) 0.1 Volume: (cm) 0.198 Character of Wound/Ulcer Post Debridement: Improved Post Procedure Diagnosis Same as Pre-procedure Notes scribed for Dr. Lady Gary by Samuella Bruin, RN Electronic Signature(s) Signed: 06/15/2022 9:55:45 AM By: Duanne Guess MD FACS Signed: 06/15/2022 3:09:24 PM By: Samuella Bruin Entered By: Samuella Bruin on 06/15/2022 09:47:48 -------------------------------------------------------------------------------- HPI Details Patient Name: Date of Service: [name]. 06/15/2022 9:15 A M Medical Record Number: [id number] Patient Account Number: [id number] Date of Birth/Sex: Treating RN: [birth year] (72 y.o. M) Primary Care Provider: Kae Heller Other Clinician: Referring Provider: Treating Provider/Extender: Tina Griffiths in Treatment: 27 History of Present Illness Location: right lateral calf closer to the knee Quality: Patient reports experiencing a dull pain to affected area(s). Severity: Patient states wound(s) are getting worse. Duration: Patient has had the wound for > 7 months prior to seeking treatment at the wound center Timing: Pain in wound is constant (hurts all the time) Context: The wound would happen gradually Modifying Factors: Patient wound(s)/ulcer(s) are worsening due to : no resolution and a white material at the base of the wound ssociated Signs and Symptoms: Patient reports having:no discharge or purulent material A HPI Description: 72 year old gentleman who has been referred to was from his PCP for a chronic ulceration on his right lower extremity which she's had for several weeks. Past medical history significant for diabetes mellitus type 2, hypertension, hyperlipidemia, anxiety, obesity, peripheral vascular  disease and [name], [name] ([id number]) [id number] Page 3 of 12 chronic kidney disease. He has never been a smoker. Most recent lab work done at his PCPs office showed a glucose of 217 milligrams per deciliter which is consistent with hyperglycemia. In January 2017 recent hemoglobin A1c values noted to be 8.2%. In April 2017, he has been seen at the vascular office by Dr. Myra Gianotti and [name] for right leg claudication. He had a right superficial femoral artery stent in September 2012. Recent noninvasive vascular imaging done on 06/24/2015 showed a right ABI of 1.07 with a triphasic waveform and a right TBI of 0.81. The left was noncompressible with a triphasic waveform and a TBI of 1.17. Right lower extremity arterial duplex was unable to identify stent exit but they were elevated velocities at the stent and in the distal thigh with triphasic waveforms. [name] opinion was to return to the clinic in 6 months with ABI and right lower extremity arterial duplex studies to be done. He has seen a dermatologist who had done a biopsy and said it was benign and he was given a steroid cream. 10/23/2015 -- Pathology report of the biopsy done in April 2017 shows ulcer with underlying angiodermatitis consistent with stasis dermatitis. 10/30/2015 -- he is going for shoulder surgery in about 10 days' time and was asking about the perioperative care. His blood sugars are running in the high 100s or low 200s. No hemoglobin A1c done recently. 11/20/2015 -- is back up to 2 weeks because of recent left shoulder surgery. 12/04/15; patient returns today with the wound for the most part looking healthy. No evidence of infection no debridement required. He is using Prisma for 4 weeks, without obvious improvement per her intake nurse. This started as several small open areas that were raised erythematous. He saw dermatology and at some point this was biopsied that just suggested stasis  skin physiology. This certainly doesn't look like that 12/11/15; using Hydrafera Blue. Previous biopsy reviewed, no atypia PAS negative. He has a history of stents in the right leg however he does not appear to have primary arterial insufficiency ABI in this clinic was over 0.9. 12/25/2015 -- he has been approved for grafix and we will order for some to be applied next week 01/01/2016 -- he has had his first application of Grafix today 01/08/2016 -- he has had his second application of Grafix today READMISSION 12/04/2021 This is a now  72 year old man who was followed in our clinic about 5 years ago for an ulceration on his right lateral lower leg, just distal to the knee. He has since undergone a number of revascularization procedures that have been complicated by restenosis and wound infections. During the course of this treatment, he developed a small ulcer on his right anterior tibial surface. Despite use of an Unna boot, the wound has continued to expand. He was referred to the wound care center by Dr. Myra Gianotti for further evaluation and management. On the right anterior tibial surface, there is an irregular wound with heavy slough and eschar accumulation. The periwound skin is intact but he does have 1-2+ pitting edema. There is no purulent drainage or malodor. 10/13; second visit for this man who has an ischemic wound in the setting of type 2 diabetes on the right anterior mid tibia area. He has been revascularized. Using Santyl gent 12/22/2021: The wound is about the same size this week. There is a lot of gray sloughy material on the surface, secondary to silver nitrate used to stop bleeding after what sounds like a fairly aggressive debridement last week. 12/30/2021: The wound is smaller this week and a bit cleaner. Edema control is excellent. There is still a bit of slough accumulation. 01/07/2022: The wound continues to contract and is much cleaner this week. Edema control is very good. He  has small openings in his upper leg surgical scars, but he is going to be seeing Dr. Myra Gianotti next Monday and will have him take a look. He has been applying manuka honey to both of the sites. We have been using Santyl and gentamicin on his lower leg wound. 01/14/2022: He saw vascular surgery this week and was told that they were happy to have [place] manage the 2 wounds from his operation. Both of these have a light layer of slough on the surface. The more distal wound is a little bit dry. The anterior tibial wound continues to accumulate a fair amount of slough. Edema control is good. 01/19/2022: Both upper leg wounds are smaller. The more distal is quite dry and cracked when I was examining it. The lower leg wound is more painful and the surrounding tissue is erythematous and indurated. 01/26/2022: The upper leg wounds are healed. The skin at the distal leg wound is quite dry. The lower leg wound is cleaner but still fairly painful. His wrap slid again this week. 02/02/2022: We had good success keeping his wrap intact using a standard Unna boot. The lower leg wound continues to contract and is clean and less painful this week. 12/11; TheraSkin #1 12/18; TheraSkin #2. Wound looks as though it is contracted. Patient states that his pain was better in the leg. Secondary dressing and kerlix Coban 02/24/2022: The wound is smaller since I saw it last. It is less painful and there is good granulation tissue, including over the exposed tendon. 03/09/2022: The wound continues to contract. There is continued fill of the wound cavity with granulation tissue. 03/16/2022: The wound is smaller again today. There is good granulation tissue forming, covering the tendon. 03/30/2022: The wound continues to contract and fill with good granulation tissue. The granulation tissue is oozing a little bit at the lateral edge. 04/13/2022: We left his TheraSkin on an extra week because it was looking so good. The wound is smaller  this week and flush with the surrounding skin. His medial thigh wound has opened up again. There is slough and eschar present. 04/20/2022: The  anterior tibial wound continues to contract. There is good granulation tissue with just a light layer of slough. The medial thigh ulcer is superficial with slough present. It is a bit fibrotic due to being in a scar. 04/27/2022: The anterior tibial wound continues to contract. There is still some slough buildup with good granulation tissue. The medial thigh ulcer is very leathery and dry. 05/04/2022: The anterior tibial wound is even smaller today. There is no visible tendon at all. He has some slough and eschar accumulation. The medial thigh ulcer continues to be quite dry with a leathery surface. 05/11/2022: The anterior tibial wound continues to contract. There is a little bit of eschar and slough buildup. The granulation tissue is also a bit hypertrophic. The medial thigh ulcer is dry and leathery, once again. 05/25/2022: The anterior tibial wound is quite a bit smaller this week. There is a little bit of eschar and slough. No reaccumulation of hypertrophic granulation tissue. The medial thigh ulcer has slightly improved tissue quality. It is still somewhat fibrotic, but there is at least a light pink color to the tissue. 06/01/2022: Both wounds are smaller. There is hypertrophic granulation tissue on the anterior tibial wound. The quality of the tissue continues to improve on the Michael [name], Michael [name] ([id number]) [id number] Page 4 of 12 medial thigh ulcer. 06/08/2022: The anterior tibial wound got a little bit macerated around the edges and the wound measures a bit larger; there is still some hypertrophic granulation tissue present. The medial thigh ulcer is measuring smaller and the quality of tissue continues to improve. There is some slough and eschar present. 06/15/2022: The anterior tibial wound looks substantially better. The  periwound skin has improved and the hypertrophic granulation tissue has resolved. There is some slough and eschar on the surface. The medial thigh ulcer is about the same size, but with continued improvement in the tissue quality. Slough and eschar is also present here. Electronic Signature(s) Signed: 06/15/2022 9:51:39 AM By: Duanne Guess MD FACS Entered By: Duanne Guess on 06/15/2022 09:51:39 -------------------------------------------------------------------------------- Physical Exam Details Patient Name: Date of Service: [name], [name] 06/15/2022 9:15 A M Medical Record Number: [id number] Patient Account Number: [id number] Date of Birth/Sex: Treating RN: [birth year] (72 y.o. M) Primary Care Provider: Kae Heller Other Clinician: Referring Provider: Treating Provider/Extender: Tina Griffiths in Treatment: 27 Constitutional Slightly hypertensive. . . . no acute distress. Respiratory Normal work of breathing on room air. Notes 06/15/2022: The anterior tibial wound looks substantially better. The periwound skin has improved and the hypertrophic granulation tissue has resolved. There is some slough and eschar on the surface. The medial thigh ulcer is about the same size, but with continued improvement in the tissue quality. Slough and eschar is also present here. Electronic Signature(s) Signed: 06/15/2022 9:52:15 AM By: Duanne Guess MD FACS Entered By: Duanne Guess on 06/15/2022 09:52:15 -------------------------------------------------------------------------------- Physician Orders Details Patient Name: Date of Service: [name], [name] 06/15/2022 9:15 A M Medical Record Number: [id number] Patient Account Number: [id number] Date of Birth/Sex: Treating RN: 1950/05/30 (72 y.o. [name] Primary Care Provider: Kae Heller Other Clinician: Referring Provider: Treating Provider/Extender: Tina Griffiths in Treatment: 86 Verbal / Phone Orders: No Diagnosis Coding ICD-10 Coding Code Description [phone] 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 I70.239 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 E11.622 Type 2 diabetes mellitus with other  skin ulcer I73.9 Peripheral vascular disease, unspecified L97.122 Non-pressure chronic ulcer of left thigh with fat layer exposed Follow-up Appointments ppointment in 1 week. - Dr. Lady Gary - Room 3 Return A Anesthetic Wound #3 Right,Anterior Lower Leg (In clinic) Topical Lidocaine 4% applied to wound bed Bathing/ Shower/ Hygiene May shower with protection but do not get wound dressing(s) wet. Protect dressing(s) with water repellant cover (for example, large plastic ALMON, WHIT[place]ORD ([id number]) [id number] Page 5 of 12 bag) or a cast cover and may then take shower. Edema Control - Lymphedema / SCD / Other Right Lower Extremity Elevate legs to the level of the heart or above for 30 minutes daily and/or when sitting for 3-4 times a day throughout the day. Avoid standing for long periods of time. Wound Treatment Wound #3 - Lower Leg Wound Laterality: Right, Anterior Cleanser: Soap and Water 1 x Per Week/30 Days Discharge Instructions: May shower and wash wound with dial antibacterial soap and water prior to dressing change. Cleanser: Wound Cleanser 1 x Per Week/30 Days Discharge Instructions: Cleanse the wound with wound cleanser prior to applying a clean dressing using gauze sponges, not tissue or cotton balls. Peri-Wound Care: Zinc Oxide Ointment 30g tube 1 x Per Week/30 Days Discharge Instructions: Apply Zinc Oxide to periwound with each dressing change Peri-Wound Care: Sween Lotion (Moisturizing lotion) 1 x Per Week/30 Days Discharge Instructions: Apply moisturizing lotion as directed Prim Dressing:  Maxorb Extra Ag+ Alginate Dressing, 2x2 (in/in) 1 x Per Week/30 Days ary Discharge Instructions: Apply to wound bed as instructed Secondary Dressing: ABD Pad, 8x10 1 x Per Week/30 Days Discharge Instructions: Apply over primary dressing as directed. Secondary Dressing: Woven Gauze Sponge, Non-Sterile 4x4 in 1 x Per Week/30 Days Discharge Instructions: Apply over primary dressing as directed. Secured With: [employer] Size 5, 10 (yds) 1 x Per Week/30 Days Compression Wrap: Kerlix Roll 4.5x3.1 (in/yd) 1 x Per Week/30 Days Discharge Instructions: Apply Kerlix and Coban compression as directed. Compression Wrap: Coban Self-Adherent Wrap 4x5 (in/yd) 1 x Per Week/30 Days Discharge Instructions: Apply over Kerlix as directed. Compression Wrap: Unnaboot w/Calamine, 4x10 (in/yd) 1 x Per Week/30 Days Discharge Instructions: Apply Unnaboot to top of dressing and around ankles to prevent dressing from sliding Wound #4 - Upper Leg Wound Laterality: Right, Medial Cleanser: Soap and Water 1 x Per Week/30 Days Discharge Instructions: May shower and wash wound with dial antibacterial soap and water prior to dressing change. Cleanser: Wound Cleanser 1 x Per Week/30 Days Discharge Instructions: Cleanse the wound with wound cleanser prior to applying a clean dressing using gauze sponges, not tissue or cotton balls. Prim Dressing: Endoform 2x2 in 1 x Per Week/30 Days ary Discharge Instructions: Moisten with hydrogel Secondary Dressing: Zetuvit Plus Silicone Border Dressing 4x4 (in/in) 1 x Per Week/30 Days Discharge Instructions: Apply silicone border over primary dressing as directed. Patient Medications llergies: Actos, Demerol, glimepiride, nabumetone, oxycodone HCl, penicillin A Notifications Medication Indication Start End 06/15/2022 lidocaine DOSE topical 4 % cream - cream topical Electronic Signature(s) Signed: 06/15/2022 9:55:45 AM By: Duanne Guess MD FACS Entered By: Duanne Guess on  06/15/2022 09:52:27 -------------------------------------------------------------------------------- Problem List Details Patient Name: Date of Service: [name], [name] 06/15/2022 9:15 A M Medical Record Number: [id number] Patient Account Number: [id number] Michael [name], Michael [name] ([id number]) [phone].pdf Page 6 of 12 Date of Birth/Sex: Treating RN: Aug 28, 1950 (72 y.o. M) Primary Care Provider: Kae Heller Other Clinician: Referring Provider: Treating Provider/Extender: Tina Griffiths in Treatment: 27 Active Problems ICD-10  Encounter Code Description Active Date MDM Diagnosis L97.815 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 12/04/2021 No Yes involvement without evidence of necrosis I70.239 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 12/04/2021 No Yes E11.622 Type 2 diabetes mellitus with other skin ulcer 12/04/2021 No Yes I73.9 Peripheral vascular disease, unspecified 12/04/2021 No Yes L97.122 Non-pressure chronic ulcer of left thigh with fat layer exposed 01/14/2022 No Yes Inactive Problems Resolved Problems Electronic Signature(s) Signed: 06/15/2022 9:50:30 AM By: Duanne Guess MD FACS Entered By: Duanne Guess on 06/15/2022 09:50:29 -------------------------------------------------------------------------------- Progress Note Details Patient Name: Date of Service: [name] 06/15/2022 9:15 A M Medical Record Number: [id number] Patient Account Number: [id number] Date of Birth/Sex: Treating RN: [birth year] (72 y.o. M) Primary Care Provider: Kae Heller Other Clinician: Referring Provider: Treating Provider/Extender: Tina Griffiths in Treatment: 27 Subjective Chief Complaint Information obtained from Patient Patient presents to the wound care center today with an open arterial ulcer to the right lower extremity in the  setting of diabetes mellitus. he has had this problem for 7 months 12/04/2021: ulcer to right lower anterior tibial surface History of Present Illness (HPI) The following HPI elements were documented for the patient's wound: Location: right lateral calf closer to the knee Quality: Patient reports experiencing a dull pain to affected area(s). Severity: Patient states wound(s) are getting worse. Duration: Patient has had the wound for > 7 months prior to seeking treatment at the wound center Timing: Pain in wound is constant (hurts all the time) Context: The wound would happen gradually Modifying Factors: Patient wound(s)/ulcer(s) are worsening due to : no resolution and a white material at the base of the wound Associated Signs and Symptoms: Patient reports having:no discharge or purulent material 72 year old gentleman who has been referred to was from his PCP for a chronic ulceration on his right lower extremity which she's had for several weeks. Past medical history significant for diabetes mellitus type 2, hypertension, hyperlipidemia, anxiety, obesity, peripheral vascular disease and chronic kidney disease. He has never been a smoker. Most recent lab work done at his PCPs office showed a glucose of 217 milligrams per deciliter which is consistent with hyperglycemia. In January 2017 recent hemoglobin A1c values noted to be 8.2%. In April 2017, he has been seen at the vascular office by Dr. Myra Gianotti and [name] for right leg claudication. He had a right superficial femoral artery stent in September 2012. Recent noninvasive vascular imaging done on 06/24/2015 showed a right ABI of 1.07 with a triphasic waveform and a right TBI of 0.81. Michael [name], Michael [name] ([id number]) [id number] Page 7 of 12 The left was noncompressible with a triphasic waveform and a TBI of 1.17. Right lower extremity arterial duplex was unable to identify stent exit but they were elevated velocities  at the stent and in the distal thigh with triphasic waveforms. [name] opinion was to return to the clinic in 6 months with ABI and right lower extremity arterial duplex studies to be done. He has seen a dermatologist who had done a biopsy and said it was benign and he was given a steroid cream. 10/23/2015 -- Pathology report of the biopsy done in April 2017 shows ulcer with underlying angiodermatitis consistent with stasis dermatitis. 10/30/2015 -- he is going for shoulder surgery in about 10 days' time and was asking about the perioperative care. His blood sugars are running in the high 100s or low 200s. No hemoglobin A1c  done recently. 11/20/2015 -- is back up to 2 weeks because of recent left shoulder surgery. 12/04/15; patient returns today with the wound for the most part looking healthy. No evidence of infection no debridement required. He is using Prisma for 4 weeks, without obvious improvement per her intake nurse. This started as several small open areas that were raised erythematous. He saw dermatology and at some point this was biopsied that just suggested stasis skin physiology. This certainly doesn't look like that 12/11/15; using Hydrafera Blue. Previous biopsy reviewed, no atypia PAS negative. He has a history of stents in the right leg however he does not appear to have primary arterial insufficiency ABI in this clinic was over 0.9. 12/25/2015 -- he has been approved for grafix and we will order for some to be applied next week 01/01/2016 -- he has had his first application of Grafix today 01/08/2016 -- he has had his second application of Grafix today READMISSION 12/04/2021 This is a now 72 year old man who was followed in our clinic about 5 years ago for an ulceration on his right lateral lower leg, just distal to the knee. He has since undergone a number of revascularization procedures that have been complicated by restenosis and wound infections. During the course of  this treatment, he developed a small ulcer on his right anterior tibial surface. Despite use of an Unna boot, the wound has continued to expand. He was referred to the wound care center by Dr. Myra Gianotti for further evaluation and management. On the right anterior tibial surface, there is an irregular wound with heavy slough and eschar accumulation. The periwound skin is intact but he does have 1-2+ pitting edema. There is no purulent drainage or malodor. 10/13; second visit for this man who has an ischemic wound in the setting of type 2 diabetes on the right anterior mid tibia area. He has been revascularized. Using Santyl gent 12/22/2021: The wound is about the same size this week. There is a lot of gray sloughy material on the surface, secondary to silver nitrate used to stop bleeding after what sounds like a fairly aggressive debridement last week. 12/30/2021: The wound is smaller this week and a bit cleaner. Edema control is excellent. There is still a bit of slough accumulation. 01/07/2022: The wound continues to contract and is much cleaner this week. Edema control is very good. He has small openings in his upper leg surgical scars, but he is going to be seeing Dr. Myra Gianotti next Monday and will have him take a look. He has been applying manuka honey to both of the sites. We have been using Santyl and gentamicin on his lower leg wound. 01/14/2022: He saw vascular surgery this week and was told that they were happy to have [place] manage the 2 wounds from his operation. Both of these have a light layer of slough on the surface. The more distal wound is a little bit dry. The anterior tibial wound continues to accumulate a fair amount of slough. Edema control is good. 01/19/2022: Both upper leg wounds are smaller. The more distal is quite dry and cracked when I was examining it. The lower leg wound is more painful and the surrounding tissue is erythematous and indurated. 01/26/2022: The upper leg wounds  are healed. The skin at the distal leg wound is quite dry. The lower leg wound is cleaner but still fairly painful. His wrap slid again this week. 02/02/2022: We had good success keeping his wrap intact using a standard Unna boot.  The lower leg wound continues to contract and is clean and less painful this week. 12/11; TheraSkin #1 12/18; TheraSkin #2. Wound looks as though it is contracted. Patient states that his pain was better in the leg. Secondary dressing and kerlix Coban 02/24/2022: The wound is smaller since I saw it last. It is less painful and there is good granulation tissue, including over the exposed tendon. 03/09/2022: The wound continues to contract. There is continued fill of the wound cavity with granulation tissue. 03/16/2022: The wound is smaller again today. There is good granulation tissue forming, covering the tendon. 03/30/2022: The wound continues to contract and fill with good granulation tissue. The granulation tissue is oozing a little bit at the lateral edge. 04/13/2022: We left his TheraSkin on an extra week because it was looking so good. The wound is smaller this week and flush with the surrounding skin. His medial thigh wound has opened up again. There is slough and eschar present. 04/20/2022: The anterior tibial wound continues to contract. There is good granulation tissue with just a light layer of slough. The medial thigh ulcer is superficial with slough present. It is a bit fibrotic due to being in a scar. 04/27/2022: The anterior tibial wound continues to contract. There is still some slough buildup with good granulation tissue. The medial thigh ulcer is very leathery and dry. 05/04/2022: The anterior tibial wound is even smaller today. There is no visible tendon at all. He has some slough and eschar accumulation. The medial thigh ulcer continues to be quite dry with a leathery surface. 05/11/2022: The anterior tibial wound continues to contract. There is a little bit of  eschar and slough buildup. The granulation tissue is also a bit hypertrophic. The medial thigh ulcer is dry and leathery, once again. 05/25/2022: The anterior tibial wound is quite a bit smaller this week. There is a little bit of eschar and slough. No reaccumulation of hypertrophic granulation tissue. The medial thigh ulcer has slightly improved tissue quality. It is still somewhat fibrotic, but there is at least a light pink color to the tissue. 06/01/2022: Both wounds are smaller. There is hypertrophic granulation tissue on the anterior tibial wound. The quality of the tissue continues to improve on the medial thigh ulcer. 06/08/2022: The anterior tibial wound got a little bit macerated around the edges and the wound measures a bit larger; there is still some hypertrophic granulation tissue present. The medial thigh ulcer is measuring smaller and the quality of tissue continues to improve. There is some slough and eschar present. Michael [name], Michael [name] ([id number]) [id number] Page 8 of 12 06/15/2022: The anterior tibial wound looks substantially better. The periwound skin has improved and the hypertrophic granulation tissue has resolved. There is some slough and eschar on the surface. The medial thigh ulcer is about the same size, but with continued improvement in the tissue quality. Slough and eschar is also present here. Patient History Information obtained from Patient. Family History Cancer - Siblings, Heart Disease - Father, Hypertension - Father,Mother, No family history of Hereditary Spherocytosis, Kidney Disease, Seizures, Stroke, Thyroid Problems, Tuberculosis. Social History Former smoker - smokeless tobacco, Marital Status - Married, Alcohol Use - Rarely - WINE, Drug Use - No History, Caffeine Use - Daily - COFFEE. Medical History Ear/Nose/Mouth/Throat Patient has history of Chronic sinus problems/congestion - seasonal allergies Cardiovascular Patient has history  of Hypertension, Peripheral Arterial Disease - STENTS, Peripheral Venous Disease Endocrine Patient has history of Type II Diabetes - last A1c- 8.2 Hospitalization/Surgery History -  left shoulder surgery. Medical A Surgical History Notes nd Constitutional Symptoms (General Health) obesity , h/o (R) leg claudication (right fem stent September 2012) Cardiovascular hyperlipidemia Endocrine pt. on diet intentionally losing 20 pounds since December 2016 in an effort to improve A1C levels Integumentary (Skin) psoriatic arthritis Musculoskeletal bilat knee pain identified as an ortho issue psoriatic arthritis Oncologic Prostate cancer 2018 Psychiatric anxiety Objective Constitutional Slightly hypertensive. no acute distress. Vitals Time Taken: 9:27 AM, Height: 72 in, Weight: 208 lbs, BMI: 28.2, Temperature: 98.1 F, Pulse: 96 bpm, Respiratory Rate: 18 breaths/min, Blood Pressure: 148/72 mmHg. Respiratory Normal work of breathing on room air. General Notes: 06/15/2022: The anterior tibial wound looks substantially better. The periwound skin has improved and the hypertrophic granulation tissue has resolved. There is some slough and eschar on the surface. The medial thigh ulcer is about the same size, but with continued improvement in the tissue quality. Slough and eschar is also present here. Integumentary (Hair, Skin) Wound #3 status is Open. Original cause of wound was Blister. The date acquired was: 09/30/2021. The wound has been in treatment 27 weeks. The wound is located on the Right,Anterior Lower Leg. The wound measures 1.9cm length x 1cm width x 0.1cm depth; 1.492cm^2 area and 0.149cm^3 volume. There is Fat Layer (Subcutaneous Tissue) exposed. There is no tunneling or undermining noted. There is a medium amount of serosanguineous drainage noted. The wound margin is distinct with the outline attached to the wound base. There is large (67-100%) red, friable, hyper - granulation within the  wound bed. There is a small (1- 33%) amount of necrotic tissue within the wound bed including Adherent Slough. The periwound skin appearance did not exhibit: Callus, Crepitus, Excoriation, Induration, Rash, Scarring, Dry/Scaly, Maceration, Atrophie Blanche, Cyanosis, Ecchymosis, Hemosiderin Staining, Mottled, Pallor, Rubor, Erythema. Periwound temperature was noted as No Abnormality. Wound #4 status is Open. Original cause of wound was Gradually Appeared. The date acquired was: 01/05/2022. The wound has been in treatment 22 weeks. The wound is located on the Right,Medial Upper Leg. The wound measures 1.8cm length x 1.4cm width x 0.1cm depth; 1.979cm^2 area and 0.198cm^3 volume. There is Fat Layer (Subcutaneous Tissue) exposed. There is no tunneling or undermining noted. There is a medium amount of serosanguineous drainage noted. The wound margin is distinct with the outline attached to the wound base. There is large (67-100%) pink granulation within the wound bed. There is a small (1- 33%) amount of necrotic tissue within the wound bed including Eschar and Adherent Slough. The periwound skin appearance had no abnormalities noted for moisture. The periwound skin appearance had no abnormalities noted for color. The periwound skin appearance exhibited: Scarring. Periwound temperature was noted as No Abnormality. 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 There was a Selective/Open Wound Non-Viable Tissue Debridement with a total area of 1.9 sq cm performed by Duanne Guess, MD. With the following instrument(s): Curette to remove Non-Viable tissue/material. Material removed includes [facility] after achieving pain control using Lidocaine 4% T opical  Solution. No specimens were taken. A time out was conducted at 09:44, prior to the start of the procedure. A Minimum amount of bleeding was controlled with Pressure. The procedure was tolerated well. Post Debridement Measurements: 1.9cm length x 1cm width x 0.1cm depth; 0.149cm^3 volume. Character of Wound/Ulcer Post Debridement is improved. Severity of Tissue Post Debridement is: Fat layer exposed. Post procedure Diagnosis Wound #3: Same as Pre-Procedure General Notes: scribed for Dr. Lady Gary by Samuella Bruin, RN. Wound #4 Pre-procedure diagnosis of Wound #4 is a Dehisced Wound located on the Right,Medial Upper Leg . There was a Selective/Open Wound Non-Viable Tissue Debridement with a total area of 2.52 sq cm performed by Duanne Guess, MD. With the following instrument(s): Curette to remove Non-Viable tissue/material. Material removed includes Eschar and Slough and after achieving pain control using Lidocaine 4% Topical Solution. No specimens were taken. A time out was conducted at 09:44, prior to the start of the procedure. A Minimum amount of bleeding was controlled with Pressure. The procedure was tolerated well. Post Debridement Measurements: 1.8cm length x 1.4cm width x 0.1cm depth; 0.198cm^3 volume. Character of Wound/Ulcer Post Debridement is improved. Post procedure Diagnosis Wound #4: Same as Pre-Procedure General Notes: scribed for Dr. Lady Gary by Samuella Bruin, RN. Plan Follow-up Appointments: Return Appointment in 1 week. - Dr. Lady Gary - Room 3 Anesthetic: Wound #3 Right,Anterior Lower Leg: (In  clinic) Topical Lidocaine 4% applied to wound bed Bathing/ Shower/ Hygiene: May shower with protection but do not get wound dressing(s) wet. Protect dressing(s) with water repellant cover (for example, large plastic bag) or a cast cover and may then take shower. Edema Control - Lymphedema / SCD / Other: Elevate legs to the level of the heart or above for 30 minutes daily and/or when sitting for 3-4 times a day throughout the day. Avoid standing for long periods of time. The following medication(s) was prescribed: lidocaine topical 4 % cream cream topical was prescribed at facility WOUND #3: - Lower Leg Wound Laterality: Right, Anterior Cleanser: Soap and Water 1 x Per Week/30 Days Discharge Instructions: May shower and wash wound with dial antibacterial soap and water prior to dressing change. Cleanser: Wound Cleanser 1 x Per Week/30 Days Discharge Instructions: Cleanse the wound with wound cleanser prior to applying a clean dressing using gauze sponges, not tissue or cotton balls. Peri-Wound Care: Zinc Oxide Ointment 30g tube 1 x Per Week/30 Days Discharge Instructions: Apply Zinc Oxide to periwound with each dressing change Peri-Wound Care: Sween Lotion (Moisturizing lotion) 1 x Per Week/30 Days Discharge Instructions: Apply moisturizing lotion as directed Prim Dressing: Maxorb Extra Ag+ Alginate Dressing, 2x2 (in/in) 1 x Per Week/30 Days ary Discharge Instructions: Apply to wound bed as instructed Secondary Dressing: ABD Pad, 8x10 1 x Per Week/30 Days Discharge Instructions: Apply over primary dressing as directed. Secondary Dressing: Woven Gauze Sponge, Non-Sterile 4x4 in 1 x Per Week/30 Days Discharge Instructions: Apply over primary dressing as directed. Secured With: [employer] Size 5, 10 (yds) 1 x Per Week/30 Days Com pression Wrap: Kerlix Roll 4.5x3.1 (in/yd) 1 x Per Week/30 Days Discharge Instructions: Apply Kerlix and Coban compression as directed. Com pression Wrap: Coban  Self-Adherent Wrap 4x5 (in/yd) 1 x Per Week/30 Days Discharge Instructions: Apply over Kerlix as directed. Com pression Wrap: Unnaboot w/Calamine, 4x10 (in/yd) 1 x Per Week/30 Days Discharge Instructions: Apply Unnaboot to top of dressing and around ankles to prevent dressing from sliding WOUND #4: - Upper Leg Wound Laterality: Right, Medial Cleanser: Soap and Water 1 x  Per Week/30 Days Discharge Instructions: May shower and wash wound with dial antibacterial soap and water prior to dressing change. Cleanser: Wound Cleanser 1 x Per Week/30 Days Discharge Instructions: Cleanse the wound with wound cleanser prior to applying a clean dressing using gauze sponges, not tissue or cotton balls. Prim Dressing: Endoform 2x2 in 1 x Per Week/30 Days ary Discharge Instructions: Moisten with hydrogel Secondary Dressing: Zetuvit Plus Silicone Border Dressing 4x4 (in/in) 1 x Per Week/30 Days Discharge Instructions: Apply silicone border over primary dressing as directed. Michael [name], Michael [name] ([id number]) [id number] Page 10 of 12 06/15/2022: The anterior tibial wound looks substantially better. The periwound skin has improved and the hypertrophic granulation tissue has resolved. There is some slough and eschar on the surface. The medial thigh ulcer is about the same size, but with continued improvement in the tissue quality. Slough and eschar is also present here. I used a curette to debride slough and eschar from his wounds. I am going to change to silver alginate on the anterior tibia and continue endoform to the medial thigh. Kerlix and Coban compression. Follow-up in 1 week. Electronic Signature(s) Signed: 06/15/2022 9:53:10 AM By: Duanne Guess MD FACS Entered By: Duanne Guess on 06/15/2022 09:53:10 -------------------------------------------------------------------------------- HxROS Details Patient Name: Date of Service: [name], [name] 06/15/2022 9:15 A M Medical  Record Number: [id number] Patient Account Number: [id number] Date of Birth/Sex: Treating RN: Jun 02, 1950 (72 y.o. M) Primary Care Provider: Kae Heller Other Clinician: Referring Provider: Treating Provider/Extender: Tina Griffiths in Treatment: 27 Information Obtained From Patient Constitutional Symptoms (General Health) Medical History: Past Medical History Notes: obesity , h/o (R) leg claudication (right fem stent September 2012) Ear/Nose/Mouth/Throat Medical History: Positive for: Chronic sinus problems/congestion - seasonal allergies Cardiovascular Medical History: Positive for: Hypertension; Peripheral Arterial Disease - STENTS; Peripheral Venous Disease Past Medical History Notes: hyperlipidemia Endocrine Medical History: Positive for: Type II Diabetes - last A1c- 8.2 Past Medical History Notes: pt. on diet intentionally losing 20 pounds since December 2016 in an effort to improve A1C levels Time with diabetes: 9 YRS Treated with: Insulin Blood sugar tested every day: Yes Tested : 7-8 TIMES Integumentary (Skin) Medical History: Past Medical History Notes: psoriatic arthritis Musculoskeletal Medical History: Past Medical History Notes: bilat knee pain identified as an ortho issue psoriatic arthritis Oncologic Medical History: Past Medical History Notes: Prostate cancer 2018 STEPEN, PRINS ([id number]) [phone].pdf Page 11 of 12 Psychiatric Medical History: Past Medical History Notes: anxiety HBO Extended History Items Ear/Nose/Mouth/Throat: Chronic sinus problems/congestion Immunizations Pneumococcal Vaccine: Received Pneumococcal Vaccination: Yes Received Pneumococcal Vaccination On or After 60th Birthday: Yes Implantable Devices None Hospitalization / Surgery History Type of Hospitalization/Surgery left shoulder surgery Family and Social History Cancer: Yes - Siblings; Heart Disease: Yes -  Father; Hereditary Spherocytosis: No; Hypertension: Yes - Father,Mother; Kidney Disease: No; Seizures: No; Stroke: No; Thyroid Problems: No; Tuberculosis: No; Former smoker - smokeless tobacco; Marital Status - Married; Alcohol Use: Rarely - WINE; Drug Use: No History; Caffeine Use: Daily - COFFEE; Financial Concerns: No; Food, Clothing or Shelter Needs: No; Support System Lacking: No; Transportation Concerns: No Electronic Signature(s) Signed: 06/15/2022 9:55:45 AM By: Duanne Guess MD FACS Entered By: Duanne Guess on 06/15/2022 09:51:46 -------------------------------------------------------------------------------- SuperBill Details Patient Name: Date of Service: [name], [name] 06/15/2022 Medical Record Number: [id number] Patient Account Number: [id number] Date of Birth/Sex: Treating RN: 1950-04-16 (72 y.o. 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 E11.622 Type 2 diabetes mellitus with other skin ulcer I73.9 Peripheral vascular disease, unspecified L97.122 Non-pressure chronic ulcer of left thigh with fat layer exposed Facility Procedures : CPT4 Code Description: [id number] - DEBRIDE WOUND 1ST 20 SQ CM OR < ICD-10 Diagnosis Description L97.815 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 involvemen L97.122 Non-pressure chronic ulcer of left thigh with fat  layer exposed Modifier: t without evidence Quantity: 1 of necrosis Physician Procedures : CPT4 Code Description Modifier [id number] - WC PHYS LEVEL 3 - EST PT 25 ICD-10 Diagnosis Description L97.815 Non-pressure  chronic ulcer of other part of right lower leg with muscle involvement without evidence L97.122 Non-pressure chronic ulcer of  left thigh with fat layer exposed I70.239 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 E11.622 Type 2 diabetes mellitus with other skin ulcer Michael [name], Michael [name] ([id number]) [id number] Quantity: 1 of necrosis Page 12 of 12 : [id number] - WC PHYS DEBR WO ANESTH 20 SQ CM ICD-10 Diagnosis Description L97.815 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

## 2022-06-15 NOTE — Progress Notes (Signed)
Michael [name] ([id number]) [id number] Page 1 of 9 Visit Report for 06/15/2022 Arrival Information Details Patient Name: Date of Service: Michael [name], [name] 06/15/2022 9:15 A M Medical Record Number: [id number] Patient Account Number: [id number] Date of Birth/Sex: Treating RN: 03-Dec-1950 (72 y.o. M) Primary Care Zlata Alcaide: Kae Heller Other Clinician: Referring Julia Alkhatib: Treating Doneta Bayman/Extender: Tina Griffiths in Treatment: 27 Visit Information History Since Last Visit All ordered tests and consults were completed: No Patient Arrived: Ambulatory Added or deleted any medications: No Arrival Time: 09:26 Any new allergies or adverse reactions: No Accompanied By: wife Had a fall or experienced change in No Transfer Assistance: None activities of daily living that may affect Patient Identification Verified: Yes risk of falls: Secondary Verification Process Completed: Yes Signs or symptoms of abuse/neglect since last visito No Patient Requires Transmission-Based Precautions: No Hospitalized since last visit: No Patient Has Alerts: Yes Implantable device outside of the clinic excluding No Patient Alerts: Patient on Blood Thinner cellular tissue based products placed in the center plavix since last visit: Pain Present Now: No Electronic Signature(s) Signed: 06/15/2022 11:28:57 AM By: Dayton Scrape Entered By: Dayton Scrape on 06/15/2022 09:27:21 -------------------------------------------------------------------------------- Encounter Discharge Information Details Patient Name: Date of Service: Michael [name], [name] 06/15/2022 9:15 A M Medical Record Number: [id number] Patient Account Number: [id number] Date of Birth/Sex: Treating RN: 1950-06-27 (72 y.o. [name] Primary Care Ardath Lepak: Kae Heller Other Clinician: Referring Alante Tolan: Treating Keyshawna Prouse/Extender: Tina Griffiths in Treatment:  27 Encounter Discharge Information Items Post Procedure Vitals Discharge Condition: Stable Temperature (F): 98.1 Ambulatory Status: Ambulatory Pulse (bpm): 96 Discharge Destination: Home Respiratory Rate (breaths/min): 18 Transportation: Private Auto Blood Pressure (mmHg): 148/72 Accompanied By: self Schedule Follow-up Appointment: Yes Clinical Summary of Care: Patient Declined Electronic Signature(s) Signed: 06/15/2022 3:09:24 PM By: Samuella Bruin Entered By: Samuella Bruin on 06/15/2022 09:48:45 -------------------------------------------------------------------------------- Lower Extremity Assessment Details Patient Name: Date of Service: Michael [name], [name] 06/15/2022 9:15 A M Medical Record Number: [id number] Patient Account Number: [id number] Date of Birth/Sex: Treating RN: 1950/11/21 (72 y.o. [name] Primary Care Amare Kontos: Kae Heller Other Clinician: Referring Granvel Proudfoot: Treating Doretta Remmert/Extender: Tina Griffiths in Treatment: 27 Edema Assessment Assessed: [name]: No] [Right: No] H[LeftMENACHEM, URBANEK C ([id number])] [Right: [id number] Page 2 of 9] Edema: [Left: [name]] [Right: o] Calf Left: Right: Point of Measurement: 35 cm From Medial Instep 36.5 cm Ankle Left: Right: Point of Measurement: 12 cm From Medial Instep 22.9 cm Vascular Assessment Pulses: Dorsalis Pedis Palpable: [Right:Yes] Electronic Signature(s) Signed: 06/15/2022 3:09:24 PM By: Samuella Bruin Entered By: Samuella Bruin on 06/15/2022 09:39:22 -------------------------------------------------------------------------------- Multi Wound Chart Details Patient Name: Date of Service: Michael [name], [name] 06/15/2022 9:15 A M Medical Record Number: [id number] Patient Account Number: [id number] Date of Birth/Sex: Treating RN: March 08, 1950 (72 y.o. M) Primary Care Alberta Lenhard: Kae Heller Other Clinician: Referring  Keionte Swicegood: Treating Byard Carranza/Extender: Tina Griffiths in Treatment: 27 Vital Signs Height(in): 72 Pulse(bpm): 96 Weight(lbs): 208 Blood Pressure(mmHg): 148/72 Body Mass Index(BMI): 28.2 Temperature(F): 98.1 Respiratory Rate(breaths/min): 18 [3:Photos:] [[name]/A:[name]/A] Right, Anterior Lower Leg Right, Medial Upper Leg [name]/A Wound Location: Blister Gradually Appeared [name]/A Wounding Event: Diabetic Wound/Ulcer of the Lower Dehisced Wound [name]/A Primary Etiology: Extremity Chronic sinus problems/congestion, Chronic sinus problems/congestion, [name]/A Comorbid History: Hypertension, Peripheral Arterial Hypertension, Peripheral Arterial Disease, Peripheral Venous Disease, Disease, Peripheral Venous Disease, Type II Diabetes Type II Diabetes 09/30/2021 01/05/2022 [name]/A Date Acquired: 100 22 [name]/A Weeks of Treatment:  Open Open [name]/A Wound Status: No No [name]/A Wound Recurrence: 1.9x1x0.1 1.8x1.4x0.1 [name]/A Measurements L x W x D (cm) 1.492 1.979 [name]/A A (cm) : rea 0.149 0.198 [name]/A Volume (cm) : 91.50% [name]/A [name]/A % Reduction in Area: 97.20% [name]/A [name]/A % Reduction in Volume: Grade 2 Full Thickness Without Exposed [name]/A Classification: Support Structures Medium Medium [name]/A Exudate Amount: Serosanguineous Serosanguineous [name]/A Exudate Type: red, brown red, brown [name]/A Exudate Color: Distinct, outline attached Distinct, outline attached [name]/A Wound Margin: Large (67-100%) Large (67-100%) [name]/A Granulation Amount: Red, Hyper-granulation, Friable Pink [name]/A Granulation QualityLAROY, PINTA ([id number]) [id number] Page 3 of 9 Small (1-33%) Small (1-33%) [name]/A Necrotic Amount: Adherent Slough Eschar, Adherent Slough [name]/A Necrotic Tissue: Fat Layer (Subcutaneous Tissue): Yes Fat Layer (Subcutaneous Tissue): Yes [name]/A Exposed Structures: Fascia: No Fascia: No Tendon: No Tendon: No Muscle: No Muscle: No Joint: No Joint: No Bone: No Bone: No Medium (34-66%) Small  (1-33%) [name]/A Epithelialization: Debridement - Selective/Open Wound Debridement - Selective/Open Wound [name]/A Debridement: Pre-procedure Verification/Time Out 09:44 09:44 [name]/A Taken: Lidocaine 4% Topical Solution Lidocaine 4% Topical Solution [name]/A Pain Control: Ambulance person, Slough [name]/A Tissue Debrided: Non-Viable Tissue Non-Viable Tissue [name]/A Level: 1.9 2.52 [name]/A Debridement A (sq cm): rea Curette Curette [name]/A Instrument: Minimum Minimum [name]/A Bleeding: Pressure Pressure [name]/A Hemostasis A chieved: Procedure was tolerated well Procedure was tolerated well [name]/A Debridement Treatment Response: 1.9x1x0.1 1.8x1.4x0.1 [name]/A Post Debridement Measurements L x W x D (cm) 0.149 0.198 [name]/A Post Debridement Volume: (cm) Excoriation: No Scarring: Yes [name]/A Periwound Skin Texture: Induration: No Callus: No Crepitus: No Rash: No Scarring: No Maceration: No No Abnormalities Noted [name]/A Periwound Skin Moisture: Dry/Scaly: No Atrophie Blanche: No No Abnormalities Noted [name]/A Periwound Skin Color: Cyanosis: No Ecchymosis: No Erythema: No Hemosiderin Staining: No Mottled: No Pallor: No Rubor: No No Abnormality No Abnormality [name]/A Temperature: Debridement Debridement [name]/A Procedures Performed: Treatment Notes Wound #3 (Lower Leg) Wound Laterality: Right, Anterior Cleanser Soap and Water Discharge Instruction: May shower and wash wound with dial antibacterial soap and water prior to dressing change. Wound Cleanser Discharge Instruction: Cleanse the wound with wound cleanser prior to applying a clean dressing using gauze sponges, not tissue or cotton balls. Peri-Wound Care Zinc Oxide Ointment 30g tube Discharge Instruction: Apply Zinc Oxide to periwound with each dressing change Sween Lotion (Moisturizing lotion) Discharge Instruction: Apply moisturizing lotion as directed Topical Primary Dressing Hydrofera Blue Ready Transfer Foam, 2.5x2.5 (in/in) Discharge Instruction: Apply directly  to wound bed as directed Secondary Dressing ABD Pad, 8x10 Discharge Instruction: Apply over primary dressing as directed. Woven Gauze Sponge, Non-Sterile 4x4 in Discharge Instruction: Apply over primary dressing as directed. Secured With [employer] Size 5, 10 (yds) Compression Wrap Kerlix Roll 4.5x3.1 (in/yd) Discharge Instruction: Apply Kerlix and Coban compression as directed. Coban Self-Adherent Wrap 4x5 (in/yd) Discharge Instruction: Apply over Kerlix as directed. Unnaboot w/Calamine, 4x10 (in/yd) Discharge Instruction: Apply Unnaboot to top of dressing and around ankles to prevent dressing from sliding TAVAN, LACHMAN ([id number]) [id number] Page 4 of 9 Compression Stockings Add-Ons Wound #4 (Upper Leg) Wound Laterality: Right, Medial Cleanser Soap and Water Discharge Instruction: May shower and wash wound with dial antibacterial soap and water prior to dressing change. Wound Cleanser Discharge Instruction: Cleanse the wound with wound cleanser prior to applying a clean dressing using gauze sponges, not tissue or cotton balls. Peri-Wound Care Topical Primary Dressing Endoform 2x2 in Discharge Instruction: Moisten with hydrogel Secondary Dressing Zetuvit Plus Silicone Border Dressing 4x4 (in/in) Discharge Instruction: Apply silicone border over primary dressing  as directed. Secured With Compression Wrap Compression Stockings Facilities manager) Signed: 06/15/2022 9:50:41 AM By: Duanne Guess MD FACS Entered By: Duanne Guess on 06/15/2022 09:50:41 -------------------------------------------------------------------------------- Multi-Disciplinary Care Plan Details Patient Name: Date of Service: Michael [name], [place] LTER C. 06/15/2022 9:15 A M Medical Record Number: [id number] Patient Account Number: [id number] Date of Birth/Sex: Treating RN: May 03, 1950 (72 y.o. [name] Primary Care Kaelee Pfeffer: Kae Heller Other  Clinician: Referring Lindaann Gradilla: Treating Royce Stegman/Extender: Tina Griffiths in Treatment: 27 Active Inactive Nutrition Nursing Diagnoses: Potential for alteratiion in Nutrition/Potential for imbalanced nutrition Goals: Patient/caregiver agrees to and verbalizes understanding of need to use nutritional supplements and/or vitamins as prescribed Date Initiated: 12/04/2021 Target Resolution Date: 10/29/2022 Goal Status: Active Patient/caregiver verbalizes understanding of need to maintain therapeutic glucose control per primary care physician Date Initiated: 12/04/2021 Target Resolution Date: 10/29/2022 Goal Status: Active Interventions: Assess patient nutrition upon admission and as needed per policy Provide education on elevated blood sugars and impact on wound healing Provide education on nutrition Treatment Activities: Education provided on Nutrition : 01/19/2022 Notes: SHAHIEM, BEDWELL ([id number]) [id number].pdf Page 5 of 9 Wound/Skin Impairment Nursing Diagnoses: Impaired tissue integrity Knowledge deficit related to ulceration/compromised skin integrity Goals: Patient/caregiver will verbalize understanding of skin care regimen Date Initiated: 12/04/2021 Date Inactivated: 02/02/2022 Target Resolution Date: 01/28/2022 Goal Status: Met Ulcer/skin breakdown will have a volume reduction of 30% by week 4 Date Initiated: 12/04/2021 Date Inactivated: 01/07/2022 Target Resolution Date: 12/26/2021 Goal Status: Met Ulcer/skin breakdown will have a volume reduction of 50% by week 8 Date Initiated: 01/07/2022 Date Inactivated: 03/16/2022 Target Resolution Date: 04/01/2022 Goal Status: Met Ulcer/skin breakdown will have a volume reduction of 80% by week 12 Date Initiated: 03/16/2022 Target Resolution Date: 10/29/2022 Goal Status: Active Interventions: Assess ulceration(s) every visit Provide education on ulcer and skin care Notes: Electronic  Signature(s) Signed: 06/15/2022 3:09:24 PM By: Samuella Bruin Entered By: Samuella Bruin on 06/15/2022 09:27:38 -------------------------------------------------------------------------------- Pain Assessment Details Patient Name: Date of Service: Michael [name] 06/15/2022 9:15 A M Medical Record Number: [id number] Patient Account Number: [id number] Date of Birth/Sex: Treating RN: 04-01-1950 (72 y.o. M) Primary Care Brandonn Capelli: Kae Heller Other Clinician: Referring Howard Patton: Treating Rikita Grabert/Extender: Tina Griffiths in Treatment: 27 Active Problems Location of Pain Severity and Description of Pain Patient Has Paino No Site Locations Pain Management and Medication Current Pain Management: Electronic Signature(s) Signed: 06/15/2022 11:28:57 AM By: Dayton Scrape Entered By: Dayton Scrape on 06/15/2022 09:27:53 Corvin, Britt Boozer ([id number]) [id number] Page 6 of 9 -------------------------------------------------------------------------------- Patient/Caregiver Education Details Patient Name: Date of Service: Michael [employer], [place] 4/15/2024andnbsp9:15 A M Medical Record Number: [id number] Patient Account Number: [id number] Date of Birth/Gender: Treating RN: 10/07/1950 (72 y.o. [name] Primary Care Physician: Kae Heller Other Clinician: Referring Physician: Treating Physician/Extender: Tina Griffiths in Treatment: 32 Education Assessment Education Provided To: Patient Education Topics Provided Wound/Skin Impairment: Methods: Explain/Verbal Responses: Reinforcements needed, State content correctly Electronic Signature(s) Signed: 06/15/2022 3:09:24 PM By: Samuella Bruin Entered By: Samuella Bruin on 06/15/2022 09:27:53 -------------------------------------------------------------------------------- Wound Assessment Details Patient Name: Date of Service: Michael [name]  C. 06/15/2022 9:15 A M Medical Record Number: [id number] Patient Account Number: [id number] Date of Birth/Sex: Treating RN: 1951-02-20 (72 y.o. M) Primary Care Mavis Gravelle: Kae Heller Other Clinician: Referring Arvine Clayburn: Treating Saloma Cadena/Extender: Tina Griffiths in Treatment: 27 Wound Status Wound Number: 3 Primary Diabetic Wound/Ulcer of the Lower Extremity Etiology: Wound Location: Right, Anterior Lower  Leg Wound Open Wounding Event: Blister Status: Date Acquired: 09/30/2021 Comorbid Chronic sinus problems/congestion, Hypertension, Peripheral Weeks Of Treatment: 27 History: Arterial Disease, Peripheral Venous Disease, Type II Diabetes Clustered Wound: No Photos Wound Measurements Length: (cm) 1.9 Width: (cm) 1 Depth: (cm) 0.1 Area: (cm) 1.492 Volume: (cm) 0.149 % Reduction in Area: 91.5% % Reduction in Volume: 97.2% Epithelialization: Medium (34-66%) Tunneling: No Undermining: No Wound Description Classification: Grade 2 Wound Margin: Distinct, outline attached Exudate Amount: Medium Sevin, Jaysen C ([id number]) Exudate Type: Serosanguineous Exudate Color: red, brown Foul Odor After Cleansing: No Slough/Fibrino Yes [id number] Page 7 of 9 Wound Bed Granulation Amount: Large (67-100%) Exposed Structure Granulation Quality: Red, Hyper-granulation, Friable Fascia Exposed: No Necrotic Amount: Small (1-33%) Fat Layer (Subcutaneous Tissue) Exposed: Yes Necrotic Quality: Adherent Slough Tendon Exposed: No Muscle Exposed: No Joint Exposed: No Bone Exposed: No Periwound Skin Texture Texture Color No Abnormalities Noted: No No Abnormalities Noted: No Callus: No Atrophie Blanche: No Crepitus: No Cyanosis: No Excoriation: No Ecchymosis: No Induration: No Erythema: No Rash: No Hemosiderin Staining: No Scarring: No Mottled: No Pallor: No Moisture Rubor: No No Abnormalities Noted: No Dry / Scaly: No  Temperature / Pain Maceration: No Temperature: No Abnormality Treatment Notes Wound #3 (Lower Leg) Wound Laterality: Right, Anterior Cleanser Soap and Water Discharge Instruction: May shower and wash wound with dial antibacterial soap and water prior to dressing change. Wound Cleanser Discharge Instruction: Cleanse the wound with wound cleanser prior to applying a clean dressing using gauze sponges, not tissue or cotton balls. Peri-Wound Care Zinc Oxide Ointment 30g tube Discharge Instruction: Apply Zinc Oxide to periwound with each dressing change Sween Lotion (Moisturizing lotion) Discharge Instruction: Apply moisturizing lotion as directed Topical Primary Dressing Hydrofera Blue Ready Transfer Foam, 2.5x2.5 (in/in) Discharge Instruction: Apply directly to wound bed as directed Secondary Dressing ABD Pad, 8x10 Discharge Instruction: Apply over primary dressing as directed. Woven Gauze Sponge, Non-Sterile 4x4 in Discharge Instruction: Apply over primary dressing as directed. Secured With [employer] Size 5, 10 (yds) Compression Wrap Kerlix Roll 4.5x3.1 (in/yd) Discharge Instruction: Apply Kerlix and Coban compression as directed. Coban Self-Adherent Wrap 4x5 (in/yd) Discharge Instruction: Apply over Kerlix as directed. Unnaboot w/Calamine, 4x10 (in/yd) Discharge Instruction: Apply Unnaboot to top of dressing and around ankles to prevent dressing from sliding Compression Stockings Add-Ons Electronic Signature(s) Signed: 06/15/2022 3:09:24 PM By: Samuella Bruin Entered By: Samuella Bruin on 06/15/2022 09:40:01 Pompei, Britt Boozer ([id number]) [id number] Page 8 of 9 -------------------------------------------------------------------------------- Wound Assessment Details Patient Name: Date of Service: Michael Jacques [employer], [name] 06/15/2022 9:15 A M Medical Record Number: [id number] Patient Account Number: [id number] Date of Birth/Sex: Treating  RN: Feb 09, 1951 (72 y.o. M) Primary Care Bricia Taher: Kae Heller Other Clinician: Referring Karalyne Nusser: Treating Roldan Laforest/Extender: Tina Griffiths in Treatment: 27 Wound Status Wound Number: 4 Primary Dehisced Wound Etiology: Wound Location: Right, Medial Upper Leg Wound Open Wounding Event: Gradually Appeared Status: Date Acquired: 01/05/2022 Comorbid Chronic sinus problems/congestion, Hypertension, Peripheral Weeks Of Treatment: 22 History: Arterial Disease, Peripheral Venous Disease, Type II Diabetes Clustered Wound: No Photos Wound Measurements Length: (cm) 1.8 Width: (cm) 1.4 Depth: (cm) 0.1 Area: (cm) 1.979 Volume: (cm) 0.198 % Reduction in Area: % Reduction in Volume: Epithelialization: Small (1-33%) Tunneling: No Undermining: No Wound Description Classification: Full Thickness Without Exposed Support Structures Wound Margin: Distinct, outline attached Exudate Amount: Medium Exudate Type: Serosanguineous Exudate Color: red, brown Foul Odor After Cleansing: No Slough/Fibrino Yes Wound Bed Granulation Amount: Large (67-100%) Exposed Structure Granulation Quality: Pink Fascia Exposed: No  Necrotic Amount: Small (1-33%) Fat Layer (Subcutaneous Tissue) Exposed: Yes Necrotic Quality: Eschar, Adherent Slough Tendon Exposed: No Muscle Exposed: No Joint Exposed: No Bone Exposed: No Periwound Skin Texture Texture Color No Abnormalities Noted: No No Abnormalities Noted: Yes Scarring: Yes Temperature / Pain Temperature: No Abnormality Moisture No Abnormalities Noted: Yes Treatment Notes Wound #4 (Upper Leg) Wound Laterality: Right, Medial Cleanser Soap and Water Discharge Instruction: May shower and wash wound with dial antibacterial soap and water prior to dressing change. Wound Cleanser Discharge Instruction: Cleanse the wound with wound cleanser prior to applying a clean dressing using gauze sponges, not tissue or cotton  balls. ARJAN, STROHM ([id number]) [id number] Page 9 of 9 Peri-Wound Care Topical Primary Dressing Endoform 2x2 in Discharge Instruction: Moisten with hydrogel Secondary Dressing Zetuvit Plus Silicone Border Dressing 4x4 (in/in) Discharge Instruction: Apply silicone border over primary dressing as directed. Secured With Compression Wrap Compression Stockings Add-Ons Electronic Signature(s) Signed: 06/15/2022 3:09:24 PM By: Samuella Bruin Entered By: Samuella Bruin on 06/15/2022 09:40:23 -------------------------------------------------------------------------------- Vitals Details Patient Name: Date of Service: Michael [name], [name] 06/15/2022 9:15 A M Medical Record Number: [id number] Patient Account Number: [id number] Date of Birth/Sex: Treating RN: 1950/09/20 (72 y.o. M) Primary Care Vanna Sailer: Kae Heller Other Clinician: Referring Lylah Lantis: Treating Rosalie Gelpi/Extender: Tina Griffiths in Treatment: 27 Vital Signs Time Taken: 09:27 Temperature (F): 98.1 Height (in): 72 Pulse (bpm): 96 Weight (lbs): 208 Respiratory Rate (breaths/min): 18 Body Mass Index (BMI): 28.2 Blood Pressure (mmHg): 148/72 Reference Range: 80 - 120 mg / dl Electronic Signature(s) Signed: 06/15/2022 11:28:57 AM By: Dayton Scrape Entered By: Dayton Scrape on 06/15/2022 09:27:45

## 2022-06-18 DIAGNOSIS — F5101 Primary insomnia: Secondary | ICD-10-CM | POA: Diagnosis not present

## 2022-06-18 DIAGNOSIS — M5440 Lumbago with sciatica, unspecified side: Secondary | ICD-10-CM | POA: Diagnosis not present

## 2022-06-18 DIAGNOSIS — N401 Enlarged prostate with lower urinary tract symptoms: Secondary | ICD-10-CM | POA: Diagnosis not present

## 2022-06-18 NOTE — Progress Notes (Signed)
Michael [name], Michael [name] ([id number]) [id number] Page 1 of 5 Visit Report for 05/18/2022 Arrival Information Details Patient Name: Date of Service: HO FFMA [place], [name] 05/18/2022 8:30 A M Medical Record Number: [id number] Patient Account Number: [id number] Date of Birth/Sex: Treating RN: [birth year] (72 y.o. Michael [name] Primary Care Michael [name]: Michael [name] Michael [name]: Referring Michael [name]: Treating Michael [name]: Michael [name]: 23 Visit Information History Since Last Visit All ordered tests and consults were completed: Yes Patient Arrived: Ambulatory Added or deleted any medications: No Arrival Time: 08:19 Any new allergies or adverse reactions: No Accompanied By: spouse Had a fall or experienced change in No Transfer Assistance: None activities of daily living that may affect Patient Identification Verified: Yes risk of falls: Secondary Verification Process Completed: Yes Signs or symptoms of abuse/neglect since last visito No Patient Requires Transmission-Based Precautions: No Hospitalized since last visit: No Patient Has Alerts: Yes Implantable device outside of the clinic excluding No Patient Alerts: Patient on Blood Thinner cellular tissue based products placed in the center plavix since last visit: Has Dressing in Place as Prescribed: Yes Pain Present Now: Yes Electronic Signature(s) Signed: 05/18/2022 5:06:20 PM By: Michael Ard RN Entered By: Michael [name] on 05/18/2022 10:21:36 -------------------------------------------------------------------------------- Clinic Level of Care Assessment Details Patient Name: Date of Service: HO FFMA [place], [name] 05/18/2022 8:30 A M Medical Record Number: [id number] Patient Account Number: [id number] Date of Birth/Sex: Treating RN: 01/13/1951 (72 y.o. Michael [name] Primary Care Michael [name]: Michael [name] Michael [name]: Referring Timira Bieda: Treating  Michael [name]/Extender: Michael [name]: 23 Clinic Level of Care Assessment Items TOOL 4 Quantity Score X- 1 0 Use when only an EandM is performed on FOLLOW-UP visit ASSESSMENTS - Nursing Assessment / Reassessment  - 0 Reassessment of Co-morbidities (includes updates in patient status)  - 0 Reassessment of Adherence to [name] Plan ASSESSMENTS - Wound and Skin A ssessment / Reassessment X - Simple Wound Assessment / Reassessment - one wound 1 5  - 0 Complex Wound Assessment / Reassessment - multiple wounds  - 0 Dermatologic / Skin Assessment (not related to wound area) ASSESSMENTS - Focused Assessment  - 0 Circumferential Edema Measurements - multi extremities  - 0 Nutritional Assessment / Counseling / Intervention  - 0 Lower Extremity Assessment (monofilament, tuning fork, pulses)  - 0 Peripheral Arterial Disease Assessment (using hand held doppler) ASSESSMENTS - Ostomy and/or Continence Assessment and Care  - 0 Incontinence Assessment and Management  - 0 Ostomy Care Assessment and Management (repouching, etc.) PROCESS - Coordination of Care Michael [name], Michael [name] ([id number]) [id number] Page 2 of 5 X- 1 15 Simple Patient / Family Education for ongoing care  - 0 Complex (extensive) Patient / Family Education for ongoing care X- 1 10 Staff obtains Chiropractor, Records, T Results / Process Orders est X- 1 10 Staff telephones HHA, Nursing Homes / Clarify orders / etc  - 0 Routine Transfer to another Facility (non-emergent condition)  - 0 Routine Hospital Admission (non-emergent condition)  - 0 New Admissions / Manufacturing engineer / Ordering NPWT Apligraf, etc. ,  - 0 Emergency Hospital Admission (emergent condition)  - 0 Simple Discharge Coordination  - 0 Complex (extensive) Discharge Coordination PROCESS - Special Needs  - 0 Pediatric / Minor Patient Management  -  0 Isolation Patient Management  - 0 Hearing / Language / Visual special needs  - 0 Assessment of Community assistance (transportation, D/C planning, etc.)  - 0 Additional assistance / Altered mentation  -  0 Support Surface(s) Assessment (bed, cushion, seat, etc.) INTERVENTIONS - Wound Cleansing / Measurement X - Simple Wound Cleansing - one wound 1 5  - 0 Complex Wound Cleansing - multiple wounds  - 0 Wound Imaging (photographs - any number of wounds)  - 0 Wound Tracing (instead of photographs)  - 0 Simple Wound Measurement - one wound  - 0 Complex Wound Measurement - multiple wounds INTERVENTIONS - Wound Dressings X - Small Wound Dressing one or multiple wounds 1 10  - 0 Medium Wound Dressing one or multiple wounds  - 0 Large Wound Dressing one or multiple wounds  - 0 Application of Medications - topical  - 0 Application of Medications - injection INTERVENTIONS - Miscellaneous  - 0 External ear exam  - 0 Specimen Collection (cultures, biopsies, blood, body fluids, etc.)  - 0 Specimen(s) / Culture(s) sent or taken to Lab for analysis  - 0 Patient Transfer (multiple staff / Nurse, adult / Similar devices)  - 0 Simple Staple / Suture removal (25 or less)  - 0 Complex Staple / Suture removal (26 or more)  - 0 Hypo / Hyperglycemic Management (close monitor of Blood Glucose)  - 0 Ankle / Brachial Index (ABI) - do not check if billed separately X- 1 5 Vital Signs Has the patient been seen at the hospital within the last three years: Yes Total Score: 60 Level Of Care: New/Established - Level 2 Electronic Signature(s) Signed: 06/18/2022 1:55:51 PM By: Michael [name], Michael [name] ([id number]) [id number] Page 3 of 5 Entered By: Brenton Grills on 05/18/2022 08:55:29 -------------------------------------------------------------------------------- Encounter Discharge Information Details Patient Name:  Date of Service: HO FFMA [place], [name] 05/18/2022 8:30 A M Medical Record Number: [id number] Patient Account Number: [id number] Date of Birth/Sex: Treating RN: [birth year] (72 y.o. Michael [name] Primary Care Michael [name]: Michael [name] Michael [name]: Referring Michael Bissonnette: Treating Michael [name]/Extender: Michael [name]: 23 Encounter Discharge Information Items Discharge Condition: Stable Ambulatory Status: Ambulatory Discharge Destination: Home Transportation: Private Auto Accompanied By: spouse Schedule Follow-up Appointment: Yes Clinical Summary of Care: Patient Declined Electronic Signature(s) Signed: 05/18/2022 5:06:20 PM By: Michael Ard RN Entered By: Michael [name] on 05/18/2022 10:22:13 -------------------------------------------------------------------------------- Patient/Caregiver Education Details Patient Name: Date of Service: [name] 3/18/2024andnbsp8:30 A M Medical Record Number: [id number] Patient Account Number: [id number] Date of Birth/Gender: Treating RN: 1951-01-03 (72 y.o. Michael [name] Primary Care Physician: Michael [name] Michael [name]: Referring Physician: Treating Physician/Extender: Michael [name]: 23 Education Assessment Education Provided To: Patient and Caregiver Education Topics Provided Wound/Skin Impairment: Handouts: Caring for Your Ulcer Methods: Explain/Verbal Responses: Reinforcements needed, State content correctly Electronic Signature(s) Signed: 05/18/2022 5:06:20 PM By: Michael Ard RN Entered By: Michael [name] on 05/18/2022 10:22:05 -------------------------------------------------------------------------------- Wound Assessment Details Patient Name: Date of Service: [name] 05/18/2022 8:30 A M Medical Record Number: [id number] Patient Account Number: [id number] Date of Birth/Sex: Treating RN: 1950-05-24 (72 y.o. Michael [name] Primary Care Esmee Fallaw: Michael [name] Michael [name]: Referring Lithzy Bernard: Treating Ayad Nieman/Extender: Michael [name]: 23 Wound Status Wound Number: 3 Primary Diabetic Wound/Ulcer of the Lower Extremity Etiology: Wound Location: Right, Anterior Lower Leg Wound Open Wounding Event: Blister Status: BADR, PIEDRA ([id number]) [id number].pdf Page 4 of 5 Status: Date Acquired: 09/30/2021 Comorbid Chronic sinus problems/congestion, Hypertension, Peripheral Weeks Of [name]: 23 History: Arterial Disease, Peripheral Venous Disease, Type II Diabetes Clustered Wound: No Wound Measurements Length: (cm) 1.8 Width: (cm) 1.5  Depth: (cm) 0.1 Area: (cm) 2.121 Volume: (cm) 0.212 % Reduction in Area: 87.9% % Reduction in Volume: 96% Epithelialization: Small (1-33%) Tunneling: No Undermining: No Wound Description Classification: Grade 2 Wound Margin: Distinct, outline attached Exudate Amount: Medium Exudate Type: Serosanguineous Exudate Color: red, brown Foul Odor After Cleansing: No Slough/Fibrino Yes Wound Bed Granulation Amount: Large (67-100%) Exposed Structure Granulation Quality: Pink Fascia Exposed: No Necrotic Amount: Small (1-33%) Fat Layer (Subcutaneous Tissue) Exposed: Yes Necrotic Quality: Adherent Slough Tendon Exposed: No Muscle Exposed: No Joint Exposed: No Bone Exposed: No Periwound Skin Texture Texture Color No Abnormalities Noted: No No Abnormalities Noted: No Scarring: Yes Rubor: Yes Moisture Temperature / Pain No Abnormalities Noted: No Temperature: No Abnormality Dry / Scaly: Yes Electronic Signature(s) Signed: 06/18/2022 1:55:51 PM By: Brenton Grills Entered By: Brenton Grills on 05/18/2022 08:31:21 -------------------------------------------------------------------------------- Wound Assessment Details Patient Name: Date of Service: [name], [name] 05/18/2022 8:30 A  M Medical Record Number: [id number] Patient Account Number: [id number] Date of Birth/Sex: Treating RN: 1950-11-07 (72 y.o. Michael [name] Primary Care Ever Gustafson: Michael [name] Michael [name]: Referring Zawadi Aplin: Treating Shanell Aden/Extender: Michael [name]: 23 Wound Status Wound Number: 4 Primary Dehisced Wound Etiology: Wound Location: Right, Medial Upper Leg Wound Open Wounding Event: Gradually Appeared Status: Date Acquired: 01/05/2022 Comorbid Chronic sinus problems/congestion, Hypertension, Peripheral Weeks Of [name]: 18 History: Arterial Disease, Peripheral Venous Disease, Type II Diabetes Clustered Wound: No Wound Measurements Length: (cm) 2 Width: (cm) 1.4 Depth: (cm) 0.1 Area: (cm) 2.199 Volume: (cm) 0.22 % Reduction in Area: % Reduction in Volume: Epithelialization: Small (1-33%) Tunneling: No Undermining: No Wound Description Classification: Full Thickness Without Exposed Support Structures Wound Margin: Distinct, outline attached Exudate Amount: Medium Exudate Type: Serosanguineous Exudate Color: red, brown Misner, Xsavier C ([id number]) Foul Odor After Cleansing: No Slough/Fibrino Yes [id number].pdf Page 5 of 5 Wound Bed Granulation Amount: Small (1-33%) Exposed Structure Granulation Quality: Red, Pink Fascia Exposed: No Necrotic Amount: Large (67-100%) Fat Layer (Subcutaneous Tissue) Exposed: Yes Necrotic Quality: Eschar, Adherent Slough Tendon Exposed: No Muscle Exposed: No Joint Exposed: No Bone Exposed: No Periwound Skin Texture Texture Color No Abnormalities Noted: No No Abnormalities Noted: Yes Scarring: Yes Temperature / Pain Temperature: No Abnormality Moisture No Abnormalities Noted: Yes Electronic Signature(s) Signed: 06/18/2022 1:55:51 PM By: Brenton Grills Entered By: Brenton Grills on 05/18/2022  08:31:35 -------------------------------------------------------------------------------- Vitals Details Patient Name: Date of Service: [name], [name] 05/18/2022 8:30 A M Medical Record Number: [id number] Patient Account Number: [id number] Date of Birth/Sex: Treating RN: 1950-09-13 (72 y.o. Michael [name] Primary Care Chang Tiggs: Michael [name] Michael [name]: Referring Jennings Stirling: Treating Krishon Adkison/Extender: Michael [name]: 23 Vital Signs Time Taken: 08:20 Temperature (F): 97.5 Height (in): 72 Pulse (bpm): 93 Weight (lbs): 208 Respiratory Rate (breaths/min): 18 Body Mass Index (BMI): 28.2 Blood Pressure (mmHg): 146/80 Reference Range: 80 - 120 mg / dl Electronic Signature(s) Signed: 05/18/2022 5:06:20 PM By: Michael Ard RN Entered By: Michael [name] on 05/18/2022 10:21:40

## 2022-06-22 ENCOUNTER — Encounter (HOSPITAL_BASED_OUTPATIENT_CLINIC_OR_DEPARTMENT_OTHER): Payer: Medicare Other | Admitting: General Surgery

## 2022-06-22 DIAGNOSIS — I70239 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: Secondary | ICD-10-CM | POA: Diagnosis not present

## 2022-06-22 DIAGNOSIS — L97812 Non-pressure chronic ulcer of other part of right lower leg with fat layer exposed: Secondary | ICD-10-CM | POA: Diagnosis not present

## 2022-06-22 DIAGNOSIS — L97815 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: Secondary | ICD-10-CM | POA: Diagnosis not present

## 2022-06-22 DIAGNOSIS — E11622 Type 2 diabetes mellitus with other skin ulcer: Secondary | ICD-10-CM | POA: Diagnosis not present

## 2022-06-22 DIAGNOSIS — T8131XA Disruption of external operation (surgical) wound, not elsewhere classified, initial encounter: Secondary | ICD-10-CM | POA: Diagnosis not present

## 2022-06-22 DIAGNOSIS — L97122 Non-pressure chronic ulcer of left thigh with fat layer exposed: Secondary | ICD-10-CM | POA: Diagnosis not present

## 2022-06-22 DIAGNOSIS — E1151 Type 2 diabetes mellitus with diabetic peripheral angiopathy without gangrene: Secondary | ICD-10-CM | POA: Diagnosis not present

## 2022-06-22 NOTE — Progress Notes (Signed)
Michael [name], ENDSLEY ([id number]) [id number] Page 1 of 9 Visit Report for 06/22/2022 Arrival Information Details Patient Name: Date of Service: Michael [name], Michael [name] C. 06/22/2022 9:15 A M Medical Record Number: [id number] Patient Account Number: [id number] Date of Birth/Sex: Treating RN: [birth year] (72 y.o. [name] Primary Care Markus Casten: Kae Heller Other Clinician: Referring Kerianna Rawlinson: Treating Jenai Scaletta/Extender: Tina Griffiths in Treatment: 28 Visit Information History Since Last Visit Added or deleted any medications: No Patient Arrived: Ambulatory Any new allergies or adverse reactions: No Arrival Time: 09:00 Had a fall or experienced change in No Accompanied By: wife activities of daily living that may affect Transfer Assistance: None risk of falls: Patient Identification Verified: Yes Signs or symptoms of abuse/neglect since last visito No Secondary Verification Process Completed: Yes Hospitalized since last visit: No Patient Requires Transmission-Based Precautions: No Implantable device outside of the clinic excluding No Patient Has Alerts: Yes cellular tissue based products placed in the center Patient Alerts: Patient on Blood Thinner since last visit: plavix Has Dressing in Place as Prescribed: Yes Has Compression in Place as Prescribed: Yes Pain Present Now: No Electronic Signature(s) Signed: 06/22/2022 4:01:39 PM By: Samuella Bruin Entered By: Samuella Bruin on 06/22/2022 09:01:41 -------------------------------------------------------------------------------- Encounter Discharge Information Details Patient Name: Date of Service: Michael [name], Michael [name] C. 06/22/2022 9:15 A M Medical Record Number: [id number] Patient Account Number: [id number] Date of Birth/Sex: Treating RN: 1951-01-26 (72 y.o. [name] Primary Care Kaarin Pardy: Kae Heller Other Clinician: Referring Saylor Sheckler: Treating  Omah Dewalt/Extender: Tina Griffiths in Treatment: 28 Encounter Discharge Information Items Post Procedure Vitals Discharge Condition: Stable Temperature (F): 98.3 Ambulatory Status: Ambulatory Pulse (bpm): 103 Discharge Destination: Home Respiratory Rate (breaths/min): 16 Transportation: Private Auto Blood Pressure (mmHg): 136/67 Accompanied By: wife Schedule Follow-up Appointment: Yes Clinical Summary of Care: Patient Declined Electronic Signature(s) Signed: 06/22/2022 4:01:39 PM By: Samuella Bruin Entered By: Samuella Bruin on 06/22/2022 09:32:34 -------------------------------------------------------------------------------- Lower Extremity Assessment Details Patient Name: Date of Service: Michael [name], Michael [name] C. 06/22/2022 9:15 A M Medical Record Number: [id number] Patient Account Number: [id number] Date of Birth/Sex: Treating RN: May 28, 1950 (72 y.o. [name] Primary Care Tajana Crotteau: Kae Heller Other Clinician: Referring Hedaya Latendresse: Treating Daryon Remmert/Extender: Tina Griffiths in Treatment: 28 Edema Assessment H[Left: Michael [name] ([id number])] Franne Forts: [id number].pdf Page 2 of 9] Assessed: [Left: No] [Right: No] Edema: [Left: [name]] [Right: o] Calf Left: Right: Point of Measurement: 35 cm From Medial Instep 35.7 cm Ankle Left: Right: Point of Measurement: 12 cm From Medial Instep 21.3 cm Vascular Assessment Pulses: Dorsalis Pedis Palpable: [Right:Yes] Electronic Signature(s) Signed: 06/22/2022 4:01:39 PM By: Samuella Bruin Entered By: Samuella Bruin on 06/22/2022 09:07:51 -------------------------------------------------------------------------------- Multi Wound Chart Details Patient Name: Date of Service: Michael [name], Michael [name] C. 06/22/2022 9:15 A M Medical Record Number: [id number] Patient Account Number: [id number] Date of Birth/Sex: Treating RN: Mar 13, 1950 (72 y.o. M) Primary  Care Jaquala Fuller: Kae Heller Other Clinician: Referring Ashawna Hanback: Treating Deeana Atwater/Extender: Tina Griffiths in Treatment: 28 Vital Signs Height(in): 72 Pulse(bpm): 103 Weight(lbs): 208 Blood Pressure(mmHg): 136/67 Body Mass Index(BMI): 28.2 Temperature(F): 98.3 Respiratory Rate(breaths/min): 16 [3:Photos:] [[name]/A:[name]/A] Right, Anterior Lower Leg Right, Medial Upper Leg [name]/A Wound Location: Blister Gradually Appeared [name]/A Wounding Event: Diabetic Wound/Ulcer of the Lower Dehisced Wound [name]/A Primary Etiology: Extremity Chronic sinus problems/congestion, Chronic sinus problems/congestion, [name]/A Comorbid History: Hypertension, Peripheral Arterial Hypertension, Peripheral Arterial Disease, Peripheral Venous Disease, Disease, Peripheral Venous Disease, Type II Diabetes Type II Diabetes 09/30/2021 01/05/2022 [name]/A  Date Acquired: 34 23 [name]/A Weeks of Treatment: Open Open [name]/A Wound Status: No No [name]/A Wound Recurrence: 1.4x0.5x0.2 2x1.2x0.1 [name]/A Measurements L x W x D (cm) 0.55 1.885 [name]/A A (cm) : rea 0.11 0.188 [name]/A Volume (cm) : 96.90% [name]/A [name]/A % Reduction in Area: 97.90% [name]/A [name]/A % Reduction in Volume: Grade 2 Full Thickness Without Exposed [name]/A Classification: Support Structures Medium Medium [name]/A Exudate Amount: Serosanguineous Serosanguineous [name]/A Exudate Type: red, brown red, brown [name]/A Exudate Color: Distinct, outline attached Distinct, outline attached [name]/A Wound Margin: Small (1-33%) Large (67-100%) [name]/A Granulation Amount: AUBERY, DOUTHAT ([id number]) [id number].pdf Page 3 of 9 Red Pink [name]/A Granulation Quality: Large (67-100%) Small (1-33%) [name]/A Necrotic Amount: Adherent Slough Eschar, Adherent Slough [name]/A Necrotic Tissue: Fat Layer (Subcutaneous Tissue): Yes Fat Layer (Subcutaneous Tissue): Yes [name]/A Exposed Structures: Fascia: No Fascia: No Tendon: No Tendon: No Muscle: No Muscle: No Joint: No Joint: No Bone: No Bone:  No Medium (34-66%) Small (1-33%) [name]/A Epithelialization: Debridement - Excisional Debridement - Selective/Open Wound [name]/A Debridement: Pre-procedure Verification/Time Out 09:14 09:14 [name]/A Taken: Lidocaine 4% Topical Solution Lidocaine 4% Topical Solution [name]/A Pain Control: Subcutaneous, [employer] [name]/A Tissue Debrided: Skin/Subcutaneous Tissue Non-Viable Tissue [name]/A Level: 0.55 1.88 [name]/A Debridement A (sq cm): rea Curette Curette [name]/A Instrument: Minimum Minimum [name]/A Bleeding: Pressure Pressure [name]/A Hemostasis A chieved: Procedure was tolerated well Procedure was tolerated well [name]/A Debridement Treatment Response: 1.4x0.5x0.2 2x1.2x0.1 [name]/A Post Debridement Measurements L x W x D (cm) 0.11 0.188 [name]/A Post Debridement Volume: (cm) Excoriation: No Scarring: Yes [name]/A Periwound Skin Texture: Induration: No Callus: No Crepitus: No Rash: No Scarring: No Maceration: No No Abnormalities Noted [name]/A Periwound Skin Moisture: Dry/Scaly: No Atrophie Blanche: No No Abnormalities Noted [name]/A Periwound Skin Color: Cyanosis: No Ecchymosis: No Erythema: No Hemosiderin Staining: No Mottled: No Pallor: No Rubor: No No Abnormality No Abnormality [name]/A Temperature: Debridement Debridement [name]/A Procedures Performed: Treatment Notes Wound #3 (Lower Leg) Wound Laterality: Right, Anterior Cleanser Soap and Water Discharge Instruction: May shower and wash wound with dial antibacterial soap and water prior to dressing change. Wound Cleanser Discharge Instruction: Cleanse the wound with wound cleanser prior to applying a clean dressing using gauze sponges, not tissue or cotton balls. Peri-Wound Care Zinc Oxide Ointment 30g tube Discharge Instruction: Apply Zinc Oxide to periwound with each dressing change Sween Lotion (Moisturizing lotion) Discharge Instruction: Apply moisturizing lotion as directed Topical Primary Dressing Maxorb Extra Ag+ Alginate Dressing, 2x2 (in/in) Discharge Instruction:  Apply to wound bed as instructed Secondary Dressing ABD Pad, 8x10 Discharge Instruction: Apply over primary dressing as directed. Woven Gauze Sponge, Non-Sterile 4x4 in Discharge Instruction: Apply over primary dressing as directed. Secured With [employer] Size 5, 10 (yds) Compression Wrap Kerlix Roll 4.5x3.1 (in/yd) Discharge Instruction: Apply Kerlix and Coban compression as directed. Coban Self-Adherent Wrap 4x5 (in/yd) Discharge Instruction: Apply over Kerlix as directed. Unnaboot w/Calamine, 4x10 (in/yd) Michael [name], Michael [name] ([id number]) [id number] Page 4 of 9 Discharge Instruction: Apply Unnaboot to top of dressing and around ankles to prevent dressing from sliding Compression Stockings Add-Ons Wound #4 (Upper Leg) Wound Laterality: Right, Medial Cleanser Soap and Water Discharge Instruction: May shower and wash wound with dial antibacterial soap and water prior to dressing change. Wound Cleanser Discharge Instruction: Cleanse the wound with wound cleanser prior to applying a clean dressing using gauze sponges, not tissue or cotton balls. Peri-Wound Care Topical Primary Dressing Endoform 2x2 in Discharge Instruction: Moisten with hydrogel Secondary Dressing Zetuvit Plus Silicone Border Dressing 4x4 (in/in) Discharge Instruction: Apply silicone  border over primary dressing as directed. Secured With Compression Wrap Compression Stockings Facilities manager) Signed: 06/22/2022 9:34:05 AM By: Duanne Guess MD FACS Entered By: Duanne Guess on 06/22/2022 09:34:04 -------------------------------------------------------------------------------- Multi-Disciplinary Care Plan Details Patient Name: Date of Service: Michael [name], Michael [name] C. 06/22/2022 9:15 A M Medical Record Number: [id number] Patient Account Number: [id number] Date of Birth/Sex: Treating RN: [birth year] (72 y.o. [name] Primary Care Michael [name]: Kae Heller  Other Clinician: Referring Michael [name]: Treating Michael [name]/Extender: Tina Griffiths in Treatment: 28 Active Inactive Nutrition Nursing Diagnoses: Potential for alteratiion in Nutrition/Potential for imbalanced nutrition Goals: Patient/caregiver agrees to and verbalizes understanding of need to use nutritional supplements and/or vitamins as prescribed Date Initiated: 12/04/2021 Target Resolution Date: 10/29/2022 Goal Status: Active Patient/caregiver verbalizes understanding of need to maintain therapeutic glucose control per primary care physician Date Initiated: 12/04/2021 Target Resolution Date: 10/29/2022 Goal Status: Active Interventions: Assess patient nutrition upon admission and as needed per policy Provide education on elevated blood sugars and impact on wound healing Provide education on nutrition Treatment Activities: Education provided on Nutrition : 01/26/2022 Michael [name], Michael [name] ([id number]) [phone].pdf Page 5 of 9 Notes: Wound/Skin Impairment Nursing Diagnoses: Impaired tissue integrity Knowledge deficit related to ulceration/compromised skin integrity Goals: Patient/caregiver will verbalize understanding of skin care regimen Date Initiated: 12/04/2021 Date Inactivated: 02/02/2022 Target Resolution Date: 01/28/2022 Goal Status: Met Ulcer/skin breakdown will have a volume reduction of 30% by week 4 Date Initiated: 12/04/2021 Date Inactivated: 01/07/2022 Target Resolution Date: 12/26/2021 Goal Status: Met Ulcer/skin breakdown will have a volume reduction of 50% by week 8 Date Initiated: 01/07/2022 Date Inactivated: 03/16/2022 Target Resolution Date: 04/01/2022 Goal Status: Met Ulcer/skin breakdown will have a volume reduction of 80% by week 12 Date Initiated: 03/16/2022 Target Resolution Date: 10/29/2022 Goal Status: Active Interventions: Assess ulceration(s) every visit Provide education on ulcer and skin  care Notes: Electronic Signature(s) Signed: 06/22/2022 4:01:39 PM By: Samuella Bruin Entered By: Samuella Bruin on 06/22/2022 09:15:55 -------------------------------------------------------------------------------- Pain Assessment Details Patient Name: Date of Service: Michael [name], Michael [name] C. 06/22/2022 9:15 A M Medical Record Number: [id number] Patient Account Number: [id number] Date of Birth/Sex: Treating RN: [birth year] (72 y.o. [name] Primary Care Lukka Black: Kae Heller Other Clinician: Referring Marlan Steward: Treating Rocky Rishel/Extender: Tina Griffiths in Treatment: 28 Active Problems Location of Pain Severity and Description of Pain Patient Has Paino No Site Locations Rate the pain. Current Pain Level: 0 Pain Management and Medication Current Pain Management: Electronic Signature(s) Signed: 06/22/2022 4:01:39 PM By: Samuella Bruin Entered By: Samuella Bruin on 06/22/2022 09:03:21 Swager, Britt Boozer ([id number]) [id number] Page 6 of 9 -------------------------------------------------------------------------------- Patient/Caregiver Education Details Patient Name: Date of Service: Michael [employer], [place] 4/22/2024andnbsp9:15 A M Medical Record Number: [id number] Patient Account Number: [id number] Date of Birth/Gender: Treating RN: 1950/07/16 (72 y.o. [name] Primary Care Physician: Kae Heller Other Clinician: Referring Physician: Treating Physician/Extender: Tina Griffiths in Treatment: 28 Education Assessment Education Provided To: Patient Education Topics Provided Wound Debridement: Methods: Explain/Verbal Responses: Reinforcements needed, State content correctly Electronic Signature(s) Signed: 06/22/2022 4:01:39 PM By: Samuella Bruin Entered By: Samuella Bruin on 06/22/2022  09:16:11 -------------------------------------------------------------------------------- Wound Assessment Details Patient Name: Date of Service: Michael [name], Michael [name] C. 06/22/2022 9:15 A M Medical Record Number: [id number] Patient Account Number: [id number] Date of Birth/Sex: Treating RN: 1950/07/03 (72 y.o. [name] Primary Care Michael [name]: Kae Heller Other Clinician: Referring Michael [name]: Treating Analena Gama/Extender: Tina Griffiths in Treatment: 28 Wound Status Wound  Number: 3 Primary Diabetic Wound/Ulcer of the Lower Extremity Etiology: Wound Location: Right, Anterior Lower Leg Wound Open Wounding Event: Blister Status: Date Acquired: 09/30/2021 Comorbid Chronic sinus problems/congestion, Hypertension, Peripheral Weeks Of Treatment: 28 History: Arterial Disease, Peripheral Venous Disease, Type II Diabetes Clustered Wound: No Photos Wound Measurements Length: (cm) Width: (cm) Depth: (cm) Area: (cm) Volume: (cm) 1.4 % Reduction in Area: 96.9% 0.5 % Reduction in Volume: 97.9% 0.2 Epithelialization: Medium (34-66%) 0.55 Tunneling: No 0.11 Undermining: No Wound Description Classification: Grade 2 Wound Margin: Distinct, outline attached Eberlin, Merle C ([id number]) Exudate Amount: Medium Exudate Type: Serosanguineous Exudate Color: red, brown Foul Odor After Cleansing: No Slough/Fibrino Yes [id number].pdf Page 7 of 9 Wound Bed Granulation Amount: Small (1-33%) Exposed Structure Granulation Quality: Red Fascia Exposed: No Necrotic Amount: Large (67-100%) Fat Layer (Subcutaneous Tissue) Exposed: Yes Necrotic Quality: Adherent Slough Tendon Exposed: No Muscle Exposed: No Joint Exposed: No Bone Exposed: No Periwound Skin Texture Texture Color No Abnormalities Noted: No No Abnormalities Noted: No Callus: No Atrophie Blanche: No Crepitus: No Cyanosis: No Excoriation: No Ecchymosis: No Induration:  No Erythema: No Rash: No Hemosiderin Staining: No Scarring: No Mottled: No Pallor: No Moisture Rubor: No No Abnormalities Noted: No Dry / Scaly: No Temperature / Pain Maceration: No Temperature: No Abnormality Treatment Notes Wound #3 (Lower Leg) Wound Laterality: Right, Anterior Cleanser Soap and Water Discharge Instruction: May shower and wash wound with dial antibacterial soap and water prior to dressing change. Wound Cleanser Discharge Instruction: Cleanse the wound with wound cleanser prior to applying a clean dressing using gauze sponges, not tissue or cotton balls. Peri-Wound Care Zinc Oxide Ointment 30g tube Discharge Instruction: Apply Zinc Oxide to periwound with each dressing change Sween Lotion (Moisturizing lotion) Discharge Instruction: Apply moisturizing lotion as directed Topical Primary Dressing Maxorb Extra Ag+ Alginate Dressing, 2x2 (in/in) Discharge Instruction: Apply to wound bed as instructed Secondary Dressing ABD Pad, 8x10 Discharge Instruction: Apply over primary dressing as directed. Woven Gauze Sponge, Non-Sterile 4x4 in Discharge Instruction: Apply over primary dressing as directed. Secured With [employer] Size 5, 10 (yds) Compression Wrap Kerlix Roll 4.5x3.1 (in/yd) Discharge Instruction: Apply Kerlix and Coban compression as directed. Coban Self-Adherent Wrap 4x5 (in/yd) Discharge Instruction: Apply over Kerlix as directed. Unnaboot w/Calamine, 4x10 (in/yd) Discharge Instruction: Apply Unnaboot to top of dressing and around ankles to prevent dressing from sliding Compression Stockings Add-Ons Electronic Signature(s) Signed: 06/22/2022 4:01:39 PM By: Tessie Eke ([id number]) [id number] Page 8 of 9 Entered By: Samuella Bruin on 06/22/2022 09:10:45 -------------------------------------------------------------------------------- Wound Assessment Details Patient Name: Date of  Service: Michael FFMA [place], Michael [name] C. 06/22/2022 9:15 A M Medical Record Number: [id number] Patient Account Number: [id number] Date of Birth/Sex: Treating RN: [birth year] (72 y.o. [name] Primary Care Michael [name]: Kae Heller Other Clinician: Referring Michael [name]: Treating Michael [name]/Extender: Tina Griffiths in Treatment: 28 Wound Status Wound Number: 4 Primary Dehisced Wound Etiology: Wound Location: Right, Medial Upper Leg Wound Open Wounding Event: Gradually Appeared Status: Date Acquired: 01/05/2022 Comorbid Chronic sinus problems/congestion, Hypertension, Peripheral Weeks Of Treatment: 23 History: Arterial Disease, Peripheral Venous Disease, Type II Diabetes Clustered Wound: No Photos Wound Measurements Length: (cm) 2 Width: (cm) 1.2 Depth: (cm) 0.1 Area: (cm) 1.885 Volume: (cm) 0.188 % Reduction in Area: % Reduction in Volume: Epithelialization: Small (1-33%) Tunneling: No Undermining: No Wound Description Classification: Full Thickness Without Exposed Support Structures Wound Margin: Distinct, outline attached Exudate Amount: Medium Exudate Type: Serosanguineous Exudate Color: red, brown Foul Odor After Cleansing: No Slough/Fibrino Yes  Wound Bed Granulation Amount: Large (67-100%) Exposed Structure Granulation Quality: Pink Fascia Exposed: No Necrotic Amount: Small (1-33%) Fat Layer (Subcutaneous Tissue) Exposed: Yes Necrotic Quality: Eschar, Adherent Slough Tendon Exposed: No Muscle Exposed: No Joint Exposed: No Bone Exposed: No Periwound Skin Texture Texture Color No Abnormalities Noted: No No Abnormalities Noted: Yes Scarring: Yes Temperature / Pain Temperature: No Abnormality Moisture No Abnormalities Noted: Yes Treatment Notes Wound #4 (Upper Leg) Wound Laterality: Right, Medial Cleanser Soap and Water Discharge Instruction: May shower and wash wound with dial antibacterial soap and water prior to dressing  change. Michael [name], Michael [name] ([id number]) [id number] Page 9 of 9 Wound Cleanser Discharge Instruction: Cleanse the wound with wound cleanser prior to applying a clean dressing using gauze sponges, not tissue or cotton balls. Peri-Wound Care Topical Primary Dressing Endoform 2x2 in Discharge Instruction: Moisten with hydrogel Secondary Dressing Zetuvit Plus Silicone Border Dressing 4x4 (in/in) Discharge Instruction: Apply silicone border over primary dressing as directed. Secured With Compression Wrap Compression Stockings Facilities manager) Signed: 06/22/2022 4:01:39 PM By: Samuella Bruin Entered By: Samuella Bruin on 06/22/2022 09:11:06 -------------------------------------------------------------------------------- Vitals Details Patient Name: Date of Service: Michael [name], [name] 06/22/2022 9:15 A M Medical Record Number: [id number] Patient Account Number: [id number] Date of Birth/Sex: Treating RN: 30-May-1950 (72 y.o. [name] Primary Care Abel Ra: Kae Heller Other Clinician: Referring Jacarie Pate: Treating Miguelangel Korn/Extender: Tina Griffiths in Treatment: 28 Vital Signs Time Taken: 09:01 Temperature (F): 98.3 Height (in): 72 Pulse (bpm): 103 Weight (lbs): 208 Respiratory Rate (breaths/min): 16 Body Mass Index (BMI): 28.2 Blood Pressure (mmHg): 136/67 Reference Range: 80 - 120 mg / dl Electronic Signature(s) Signed: 06/22/2022 4:01:39 PM By: Samuella Bruin Entered By: Samuella Bruin on 06/22/2022 09:03:10

## 2022-06-22 NOTE — Progress Notes (Signed)
AVIN, GIBBONS ([id number]) [id number] Page 1 of 12 Visit Report for 06/22/2022 Chief Complaint Document Details Patient Name: Date of Service: HO FFMA [place], [name] 06/22/2022 9:15 A M Medical Record Number: [id number] Patient Account Number: [id number] Date of Birth/Sex: Treating RN: 1950-08-21 (72 y.o. M) Primary Care Provider: Kae Heller Other Clinician: Referring Provider: Treating Provider/Extender: Tina Griffiths in Treatment: 28 Information Obtained from: Patient Chief Complaint Patient presents to the wound care center today with an open arterial ulcer to the right lower extremity in the setting of diabetes mellitus. he has had this problem for 7 months 12/04/2021: ulcer to right lower anterior tibial surface Electronic Signature(s) Signed: 06/22/2022 9:35:09 AM By: Duanne Guess MD FACS Entered By: Duanne Guess on 06/22/2022 09:35:09 -------------------------------------------------------------------------------- Debridement Details Patient Name: Date of Service: [name], [name] 06/22/2022 9:15 A M Medical Record Number: [id number] Patient Account Number: [id number] Date of Birth/Sex: Treating RN: 1950/06/29 (72 y.o. [name] Primary Care Provider: Kae Heller Other Clinician: Referring Provider: Treating Provider/Extender: Tina Griffiths in Treatment: 28 Debridement Performed for Assessment: Wound #3 Right,Anterior Lower Leg Performed By: Physician Duanne Guess, MD Debridement Type: Debridement Severity of Tissue Pre Debridement: Fat layer exposed Level of Consciousness (Pre-procedure): Awake and Alert Pre-procedure Verification/Time Out Yes - 09:14 Taken: Start Time: 09:14 Pain Control: Lidocaine 4% T opical Solution Percent of Wound Bed Debrided: 100% T Area Debrided (cm): otal 0.55 Tissue and other material debrided: Non-Viable, Slough, Subcutaneous,  Slough Level: Skin/Subcutaneous Tissue Debridement Description: Excisional Instrument: Curette Bleeding: Minimum Hemostasis Achieved: Pressure Response to Treatment: Procedure was tolerated well Level of Consciousness (Post- Awake and Alert procedure): Post Debridement Measurements of Total Wound Length: (cm) 1.4 Width: (cm) 0.5 Depth: (cm) 0.2 Volume: (cm) 0.11 Character of Wound/Ulcer Post Debridement: Improved Severity of Tissue Post Debridement: Fat layer exposed Post Procedure Diagnosis Same as Pre-procedure Notes scribed for Dr. Lady Gary by Samuella Bruin, RN Electronic Signature(s) [name] ([id number]) [phone].pdf Page 2 of 12 Signed: 06/22/2022 10:07:31 AM By: Duanne Guess MD FACS Signed: 06/22/2022 4:01:39 PM By: Gelene Mink By: Samuella Bruin on 06/22/2022 09:16:38 -------------------------------------------------------------------------------- Debridement Details Patient Name: Date of Service: [name], [name] 06/22/2022 9:15 A M Medical Record Number: [id number] Patient Account Number: [id number] Date of Birth/Sex: Treating RN: Jul 25, 1950 (72 y.o. [name] Primary Care Provider: Kae Heller Other Clinician: Referring Provider: Treating Provider/Extender: Tina Griffiths in Treatment: 28 Debridement Performed for Assessment: Wound #4 Right,Medial Upper Leg Performed By: Physician Duanne Guess, MD Debridement Type: Debridement Level of Consciousness (Pre-procedure): Awake and Alert Pre-procedure Verification/Time Out Yes - 09:14 Taken: Start Time: 09:14 Pain Control: Lidocaine 4% T opical Solution Percent of Wound Bed Debrided: 100% T Area Debrided (cm): otal 1.88 Tissue and other material debrided: Non-Viable, Slough, Slough Level: Non-Viable Tissue Debridement Description: Selective/Open Wound Instrument: Curette Bleeding: Minimum Hemostasis  Achieved: Pressure Response to Treatment: Procedure was tolerated well Level of Consciousness (Post- Awake and Alert procedure): Post Debridement Measurements of Total Wound Length: (cm) 2 Width: (cm) 1.2 Depth: (cm) 0.1 Volume: (cm) 0.188 Character of Wound/Ulcer Post Debridement: Improved Post Procedure Diagnosis Same as Pre-procedure Notes scribed for Dr. Lady Gary by Samuella Bruin, RN Electronic Signature(s) Signed: 06/22/2022 10:07:31 AM By: Duanne Guess MD FACS Signed: 06/22/2022 4:01:39 PM By: Samuella Bruin Entered By: Samuella Bruin on 06/22/2022 09:29:49 -------------------------------------------------------------------------------- HPI Details Patient Name: Date of Service: [name], [name] 06/22/2022 9:15 A M Medical  Record Number: [id number] Patient Account Number: [id number] Date of Birth/Sex: Treating RN: 1950-06-27 (72 y.o. M) Primary Care Provider: Kae Heller Other Clinician: Referring Provider: Treating Provider/Extender: Tina Griffiths in Treatment: 28 History of Present Illness Location: right lateral calf closer to the knee Quality: Patient reports experiencing a dull pain to affected area(s). Severity: Patient states wound(s) are getting worse. Duration: Patient has had the wound for > 7 months prior to seeking treatment at the wound center Timing: Pain in wound is constant (hurts all the time) Context: The wound would happen gradually Modifying Factors: Patient wound(s)/ulcer(s) are worsening due to : no resolution and a white material at the base of the wound ssociated Signs and Symptoms: Patient reports having:no discharge or purulent material A ATILANO, COVELLI ([id number]) [id number] Page 3 of 12 HPI Description: 72 year old gentleman who has been referred to was from his PCP for a chronic ulceration on his right lower extremity which she's had for several weeks. Past medical  history significant for diabetes mellitus type 2, hypertension, hyperlipidemia, anxiety, obesity, peripheral vascular disease and chronic kidney disease. He has never been a smoker. Most recent lab work done at his PCPs office showed a glucose of 217 milligrams per deciliter which is consistent with hyperglycemia. In January 2017 recent hemoglobin A1c values noted to be 8.2%. In April 2017, he has been seen at the vascular office by Dr. Myra Gianotti and [name] for right leg claudication. He had a right superficial femoral artery stent in September 2012. Recent noninvasive vascular imaging done on 06/24/2015 showed a right ABI of 1.07 with a triphasic waveform and a right TBI of 0.81. The left was noncompressible with a triphasic waveform and a TBI of 1.17. Right lower extremity arterial duplex was unable to identify stent exit but they were elevated velocities at the stent and in the distal thigh with triphasic waveforms. [name] opinion was to return to the clinic in 6 months with ABI and right lower extremity arterial duplex studies to be done. He has seen a dermatologist who had done a biopsy and said it was benign and he was given a steroid cream. 10/23/2015 -- Pathology report of the biopsy done in April 2017 shows ulcer with underlying angiodermatitis consistent with stasis dermatitis. 10/30/2015 -- he is going for shoulder surgery in about 10 days' time and was asking about the perioperative care. His blood sugars are running in the high 100s or low 200s. No hemoglobin A1c done recently. 11/20/2015 -- is back up to 2 weeks because of recent left shoulder surgery. 12/04/15; patient returns today with the wound for the most part looking healthy. No evidence of infection no debridement required. He is using Prisma for 4 weeks, without obvious improvement per her intake nurse. This started as several small open areas that were raised erythematous. He saw dermatology and at some point this was  biopsied that just suggested stasis skin physiology. This certainly doesn't look like that 12/11/15; using Hydrafera Blue. Previous biopsy reviewed, no atypia PAS negative. He has a history of stents in the right leg however he does not appear to have primary arterial insufficiency ABI in this clinic was over 0.9. 12/25/2015 -- he has been approved for grafix and we will order for some to be applied next week 01/01/2016 -- he has had his first application of Grafix today 01/08/2016 -- he has had his second application of Grafix today READMISSION 12/04/2021 This is a now 72 year old man who was followed in our  clinic about 5 years ago for an ulceration on his right lateral lower leg, just distal to the knee. He has since undergone a number of revascularization procedures that have been complicated by restenosis and wound infections. During the course of this treatment, he developed a small ulcer on his right anterior tibial surface. Despite use of an Unna boot, the wound has continued to expand. He was referred to the wound care center by Dr. Myra Gianotti for further evaluation and management. On the right anterior tibial surface, there is an irregular wound with heavy slough and eschar accumulation. The periwound skin is intact but he does have 1-2+ pitting edema. There is no purulent drainage or malodor. 10/13; second visit for this man who has an ischemic wound in the setting of type 2 diabetes on the right anterior mid tibia area. He has been revascularized. Using Santyl gent 12/22/2021: The wound is about the same size this week. There is a lot of gray sloughy material on the surface, secondary to silver nitrate used to stop bleeding after what sounds like a fairly aggressive debridement last week. 12/30/2021: The wound is smaller this week and a bit cleaner. Edema control is excellent. There is still a bit of slough accumulation. 01/07/2022: The wound continues to contract and is much cleaner this  week. Edema control is very good. He has small openings in his upper leg surgical scars, but he is going to be seeing Dr. Myra Gianotti next Monday and will have him take a look. He has been applying manuka honey to both of the sites. We have been using Santyl and gentamicin on his lower leg wound. 01/14/2022: He saw vascular surgery this week and was told that they were happy to have [place] manage the 2 wounds from his operation. Both of these have a light layer of slough on the surface. The more distal wound is a little bit dry. The anterior tibial wound continues to accumulate a fair amount of slough. Edema control is good. 01/19/2022: Both upper leg wounds are smaller. The more distal is quite dry and cracked when I was examining it. The lower leg wound is more painful and the surrounding tissue is erythematous and indurated. 01/26/2022: The upper leg wounds are healed. The skin at the distal leg wound is quite dry. The lower leg wound is cleaner but still fairly painful. His wrap slid again this week. 02/02/2022: We had good success keeping his wrap intact using a standard Unna boot. The lower leg wound continues to contract and is clean and less painful this week. 12/11; TheraSkin #1 12/18; TheraSkin #2. Wound looks as though it is contracted. Patient states that his pain was better in the leg. Secondary dressing and kerlix Coban 02/24/2022: The wound is smaller since I saw it last. It is less painful and there is good granulation tissue, including over the exposed tendon. 03/09/2022: The wound continues to contract. There is continued fill of the wound cavity with granulation tissue. 03/16/2022: The wound is smaller again today. There is good granulation tissue forming, covering the tendon. 03/30/2022: The wound continues to contract and fill with good granulation tissue. The granulation tissue is oozing a little bit at the lateral edge. 04/13/2022: We left his TheraSkin on an extra week because it was  looking so good. The wound is smaller this week and flush with the surrounding skin. His medial thigh wound has opened up again. There is slough and eschar present. 04/20/2022: The anterior tibial wound continues to contract. There  is good granulation tissue with just a light layer of slough. The medial thigh ulcer is superficial with slough present. It is a bit fibrotic due to being in a scar. 04/27/2022: The anterior tibial wound continues to contract. There is still some slough buildup with good granulation tissue. The medial thigh ulcer is very leathery and dry. 05/04/2022: The anterior tibial wound is even smaller today. There is no visible tendon at all. He has some slough and eschar accumulation. The medial thigh ulcer continues to be quite dry with a leathery surface. 05/11/2022: The anterior tibial wound continues to contract. There is a little bit of eschar and slough buildup. The granulation tissue is also a bit hypertrophic. The medial thigh ulcer is dry and leathery, once again. 05/25/2022: The anterior tibial wound is quite a bit smaller this week. There is a little bit of eschar and slough. No reaccumulation of hypertrophic granulation JAAZIEL, PEATROSS ([id number]) [phone].pdf Page 4 of 12 tissue. The medial thigh ulcer has slightly improved tissue quality. It is still somewhat fibrotic, but there is at least a light pink color to the tissue. 06/01/2022: Both wounds are smaller. There is hypertrophic granulation tissue on the anterior tibial wound. The quality of the tissue continues to improve on the medial thigh ulcer. 06/08/2022: The anterior tibial wound got a little bit macerated around the edges and the wound measures a bit larger; there is still some hypertrophic granulation tissue present. The medial thigh ulcer is measuring smaller and the quality of tissue continues to improve. There is some slough and eschar present. 06/15/2022: The anterior tibial wound  looks substantially better. The periwound skin has improved and the hypertrophic granulation tissue has resolved. There is some slough and eschar on the surface. The medial thigh ulcer is about the same size, but with continued improvement in the tissue quality. Slough and eschar is also present here. 06/22/2022: The anterior tibial wound is a little bit fibrotic today, but smaller. The medial thigh wound is unchanged in size and also remains somewhat fibrotic as well. Electronic Signature(s) Signed: 06/22/2022 9:40:19 AM By: Duanne Guess MD FACS Entered By: Duanne Guess on 06/22/2022 09:40:19 -------------------------------------------------------------------------------- Physical Exam Details Patient Name: Date of Service: [name], [name] 06/22/2022 9:15 A M Medical Record Number: [id number] Patient Account Number: [id number] Date of Birth/Sex: Treating RN: [birth year] (72 y.o. M) Primary Care Provider: Kae Heller Other Clinician: Referring Provider: Treating Provider/Extender: Tina Griffiths in Treatment: 28 Constitutional . Slightly tachycardic. . . no acute distress. Respiratory Normal work of breathing on room air. Notes 06/22/2022: The anterior tibial wound is a little bit fibrotic today, but smaller. The medial thigh wound is unchanged in size and also remains somewhat fibrotic as well. Electronic Signature(s) Signed: 06/22/2022 9:41:17 AM By: Duanne Guess MD FACS Entered By: Duanne Guess on 06/22/2022 09:41:17 -------------------------------------------------------------------------------- Physician Orders Details Patient Name: Date of Service: [name], [name] 06/22/2022 9:15 A M Medical Record Number: [id number] Patient Account Number: [id number] Date of Birth/Sex: Treating RN: Sep 24, 1950 (72 y.o. [name] Primary Care Provider: Kae Heller Other Clinician: Referring Provider: Treating Provider/Extender:  Tina Griffiths in Treatment: 28 Verbal / Phone Orders: No Diagnosis Coding ICD-10 Coding Code Description [phone] 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 I70.239 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 E11.622 Type 2 diabetes mellitus with other skin ulcer I73.9 Peripheral  vascular disease, unspecified L97.122 Non-pressure chronic ulcer of left thigh with fat layer exposed Follow-up Appointments ppointment in 1 week. - Dr. Lady Gary - Room 3 Return A Anesthetic Wound #3 Right,Anterior Lower Leg (In clinic) Topical Lidocaine 4% applied to wound bed JEZIAH, KRETSCHMER ([id number]) [phone].pdf Page 5 of 12 Bathing/ Shower/ Hygiene May shower with protection but do not get wound dressing(s) wet. Protect dressing(s) with water repellant cover (for example, large plastic bag) or a cast cover and may then take shower. Edema Control - Lymphedema / SCD / Other Right Lower Extremity Elevate legs to the level of the heart or above for 30 minutes daily and/or when sitting for 3-4 times a day throughout the day. Avoid standing for long periods of time. Wound Treatment Wound #3 - Lower Leg Wound Laterality: Right, Anterior Cleanser: Soap and Water 1 x Per Week/30 Days Discharge Instructions: May shower and wash wound with dial antibacterial soap and water prior to dressing change. Cleanser: Wound Cleanser 1 x Per Week/30 Days Discharge Instructions: Cleanse the wound with wound cleanser prior to applying a clean dressing using gauze sponges, not tissue or cotton balls. Peri-Wound Care: Zinc Oxide Ointment 30g tube 1 x Per Week/30 Days Discharge Instructions: Apply Zinc Oxide to periwound with each dressing change Peri-Wound Care: Sween Lotion (Moisturizing lotion) 1 x Per Week/30 Days Discharge Instructions: Apply moisturizing lotion  as directed Prim Dressing: Maxorb Extra Ag+ Alginate Dressing, 2x2 (in/in) 1 x Per Week/30 Days ary Discharge Instructions: Apply to wound bed as instructed Secondary Dressing: ABD Pad, 8x10 1 x Per Week/30 Days Discharge Instructions: Apply over primary dressing as directed. Secondary Dressing: Woven Gauze Sponge, Non-Sterile 4x4 in 1 x Per Week/30 Days Discharge Instructions: Apply over primary dressing as directed. Secured With: [employer] Size 5, 10 (yds) 1 x Per Week/30 Days Compression Wrap: Kerlix Roll 4.5x3.1 (in/yd) 1 x Per Week/30 Days Discharge Instructions: Apply Kerlix and Coban compression as directed. Compression Wrap: Coban Self-Adherent Wrap 4x5 (in/yd) 1 x Per Week/30 Days Discharge Instructions: Apply over Kerlix as directed. Compression Wrap: Unnaboot w/Calamine, 4x10 (in/yd) 1 x Per Week/30 Days Discharge Instructions: Apply Unnaboot to top of dressing and around ankles to prevent dressing from sliding Wound #4 - Upper Leg Wound Laterality: Right, Medial Cleanser: Soap and Water 1 x Per Week/30 Days Discharge Instructions: May shower and wash wound with dial antibacterial soap and water prior to dressing change. Cleanser: Wound Cleanser 1 x Per Week/30 Days Discharge Instructions: Cleanse the wound with wound cleanser prior to applying a clean dressing using gauze sponges, not tissue or cotton balls. Prim Dressing: Endoform 2x2 in 1 x Per Week/30 Days ary Discharge Instructions: Moisten with hydrogel Secondary Dressing: Zetuvit Plus Silicone Border Dressing 4x4 (in/in) 1 x Per Week/30 Days Discharge Instructions: Apply silicone border over primary dressing as directed. Patient Medications llergies: Actos, Demerol, glimepiride, nabumetone, oxycodone HCl, penicillin A Notifications Medication Indication Start End 06/22/2022 lidocaine DOSE topical 4 % cream - cream topical Electronic Signature(s) Signed: 06/22/2022 10:07:31 AM By: Duanne Guess MD FACS Entered  By: Duanne Guess on 06/22/2022 09:41:27 [place], Britt Boozer ([id number]) [id number] Page 6 of 12 -------------------------------------------------------------------------------- Problem List Details Patient Name: Date of Service: HO [place], [name] 06/22/2022 9:15 A M Medical Record Number: [id number] Patient Account Number: [id number] Date of Birth/Sex: Treating RN: 1950-03-24 (72 y.o. M) Primary Care Provider: Kae Heller Other Clinician: Referring Provider: Treating Provider/Extender: Tina Griffiths in Treatment: 28 Active Problems ICD-10 Encounter Code Description Active  Date MDM Diagnosis L97.815 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 12/04/2021 No Yes involvement without evidence of necrosis I70.239 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 12/04/2021 No Yes E11.622 Type 2 diabetes mellitus with other skin ulcer 12/04/2021 No Yes I73.9 Peripheral vascular disease, unspecified 12/04/2021 No Yes L97.122 Non-pressure chronic ulcer of left thigh with fat layer exposed 01/14/2022 No Yes Inactive Problems Resolved Problems Electronic Signature(s) Signed: 06/22/2022 9:33:53 AM By: Duanne Guess MD FACS Entered By: Duanne Guess on 06/22/2022 09:33:53 -------------------------------------------------------------------------------- Progress Note Details Patient Name: Date of Service: [name], [name] 06/22/2022 9:15 A M Medical Record Number: [id number] Patient Account Number: [id number] Date of Birth/Sex: Treating RN: 1950-12-19 (72 y.o. M) Primary Care Provider: Kae Heller Other Clinician: Referring Provider: Treating Provider/Extender: Tina Griffiths in Treatment: 28 Subjective Chief Complaint Information obtained from Patient Patient presents to the wound care center today with an open arterial ulcer to the right  lower extremity in the setting of diabetes mellitus. he has had this problem for 7 months 12/04/2021: ulcer to right lower anterior tibial surface History of Present Illness (HPI) The following HPI elements were documented for the patient's wound: Location: right lateral calf closer to the knee Quality: Patient reports experiencing a dull pain to affected area(s). Severity: Patient states wound(s) are getting worse. Duration: Patient has had the wound for > 7 months prior to seeking treatment at the wound center Timing: Pain in wound is constant (hurts all the time) Context: The wound would happen gradually Modifying Factors: Patient wound(s)/ulcer(s) are worsening due to : no resolution and a white material at the base of the wound Associated Signs and Symptoms: Patient reports having:no discharge or purulent material 72 year old gentleman who has been referred to was from his PCP for a chronic ulceration on his right lower extremity which she's had for several weeks. Past GAREN, WOOLBRIGHT ([id number]) [id number] Page 7 of 12 medical history significant for diabetes mellitus type 2, hypertension, hyperlipidemia, anxiety, obesity, peripheral vascular disease and chronic kidney disease. He has never been a smoker. Most recent lab work done at his PCPs office showed a glucose of 217 milligrams per deciliter which is consistent with hyperglycemia. In January 2017 recent hemoglobin A1c values noted to be 8.2%. In April 2017, he has been seen at the vascular office by Dr. Myra Gianotti and [name] for right leg claudication. He had a right superficial femoral artery stent in September 2012. Recent noninvasive vascular imaging done on 06/24/2015 showed a right ABI of 1.07 with a triphasic waveform and a right TBI of 0.81. The left was noncompressible with a triphasic waveform and a TBI of 1.17. Right lower extremity arterial duplex was unable to identify stent exit but they  were elevated velocities at the stent and in the distal thigh with triphasic waveforms. [name] opinion was to return to the clinic in 6 months with ABI and right lower extremity arterial duplex studies to be done. He has seen a dermatologist who had done a biopsy and said it was benign and he was given a steroid cream. 10/23/2015 -- Pathology report of the biopsy done in April 2017 shows ulcer with underlying angiodermatitis consistent with stasis dermatitis. 10/30/2015 -- he is going for shoulder surgery in about 10 days' time and was asking about the perioperative care. His blood sugars are running in the high 100s or low 200s. No hemoglobin A1c done recently. 11/20/2015 --  is back up to 2 weeks because of recent left shoulder surgery. 12/04/15; patient returns today with the wound for the most part looking healthy. No evidence of infection no debridement required. He is using Prisma for 4 weeks, without obvious improvement per her intake nurse. This started as several small open areas that were raised erythematous. He saw dermatology and at some point this was biopsied that just suggested stasis skin physiology. This certainly doesn't look like that 12/11/15; using Hydrafera Blue. Previous biopsy reviewed, no atypia PAS negative. He has a history of stents in the right leg however he does not appear to have primary arterial insufficiency ABI in this clinic was over 0.9. 12/25/2015 -- he has been approved for grafix and we will order for some to be applied next week 01/01/2016 -- he has had his first application of Grafix today 01/08/2016 -- he has had his second application of Grafix today READMISSION 12/04/2021 This is a now 72 year old man who was followed in our clinic about 5 years ago for an ulceration on his right lateral lower leg, just distal to the knee. He has since undergone a number of revascularization procedures that have been complicated by restenosis and wound infections.  During the course of this treatment, he developed a small ulcer on his right anterior tibial surface. Despite use of an Unna boot, the wound has continued to expand. He was referred to the wound care center by Dr. Myra Gianotti for further evaluation and management. On the right anterior tibial surface, there is an irregular wound with heavy slough and eschar accumulation. The periwound skin is intact but he does have 1-2+ pitting edema. There is no purulent drainage or malodor. 10/13; second visit for this man who has an ischemic wound in the setting of type 2 diabetes on the right anterior mid tibia area. He has been revascularized. Using Santyl gent 12/22/2021: The wound is about the same size this week. There is a lot of gray sloughy material on the surface, secondary to silver nitrate used to stop bleeding after what sounds like a fairly aggressive debridement last week. 12/30/2021: The wound is smaller this week and a bit cleaner. Edema control is excellent. There is still a bit of slough accumulation. 01/07/2022: The wound continues to contract and is much cleaner this week. Edema control is very good. He has small openings in his upper leg surgical scars, but he is going to be seeing Dr. Myra Gianotti next Monday and will have him take a look. He has been applying manuka honey to both of the sites. We have been using Santyl and gentamicin on his lower leg wound. 01/14/2022: He saw vascular surgery this week and was told that they were happy to have [place] manage the 2 wounds from his operation. Both of these have a light layer of slough on the surface. The more distal wound is a little bit dry. The anterior tibial wound continues to accumulate a fair amount of slough. Edema control is good. 01/19/2022: Both upper leg wounds are smaller. The more distal is quite dry and cracked when I was examining it. The lower leg wound is more painful and the surrounding tissue is erythematous and indurated. 01/26/2022:  The upper leg wounds are healed. The skin at the distal leg wound is quite dry. The lower leg wound is cleaner but still fairly painful. His wrap slid again this week. 02/02/2022: We had good success keeping his wrap intact using a standard Unna boot. The lower leg wound  continues to contract and is clean and less painful this week. 12/11; TheraSkin #1 12/18; TheraSkin #2. Wound looks as though it is contracted. Patient states that his pain was better in the leg. Secondary dressing and kerlix Coban 02/24/2022: The wound is smaller since I saw it last. It is less painful and there is good granulation tissue, including over the exposed tendon. 03/09/2022: The wound continues to contract. There is continued fill of the wound cavity with granulation tissue. 03/16/2022: The wound is smaller again today. There is good granulation tissue forming, covering the tendon. 03/30/2022: The wound continues to contract and fill with good granulation tissue. The granulation tissue is oozing a little bit at the lateral edge. 04/13/2022: We left his TheraSkin on an extra week because it was looking so good. The wound is smaller this week and flush with the surrounding skin. His medial thigh wound has opened up again. There is slough and eschar present. 04/20/2022: The anterior tibial wound continues to contract. There is good granulation tissue with just a light layer of slough. The medial thigh ulcer is superficial with slough present. It is a bit fibrotic due to being in a scar. 04/27/2022: The anterior tibial wound continues to contract. There is still some slough buildup with good granulation tissue. The medial thigh ulcer is very leathery and dry. 05/04/2022: The anterior tibial wound is even smaller today. There is no visible tendon at all. He has some slough and eschar accumulation. The medial thigh ulcer continues to be quite dry with a leathery surface. 05/11/2022: The anterior tibial wound continues to contract. There  is a little bit of eschar and slough buildup. The granulation tissue is also a bit hypertrophic. The medial thigh ulcer is dry and leathery, once again. 05/25/2022: The anterior tibial wound is quite a bit smaller this week. There is a little bit of eschar and slough. No reaccumulation of hypertrophic granulation tissue. The medial thigh ulcer has slightly improved tissue quality. It is still somewhat fibrotic, but there is at least a light pink color to the tissue. SEENA, FACE ([id number]) [id number] Page 8 of 12 06/01/2022: Both wounds are smaller. There is hypertrophic granulation tissue on the anterior tibial wound. The quality of the tissue continues to improve on the medial thigh ulcer. 06/08/2022: The anterior tibial wound got a little bit macerated around the edges and the wound measures a bit larger; there is still some hypertrophic granulation tissue present. The medial thigh ulcer is measuring smaller and the quality of tissue continues to improve. There is some slough and eschar present. 06/15/2022: The anterior tibial wound looks substantially better. The periwound skin has improved and the hypertrophic granulation tissue has resolved. There is some slough and eschar on the surface. The medial thigh ulcer is about the same size, but with continued improvement in the tissue quality. Slough and eschar is also present here. 06/22/2022: The anterior tibial wound is a little bit fibrotic today, but smaller. The medial thigh wound is unchanged in size and also remains somewhat fibrotic as well. Patient History Information obtained from Patient. Family History Cancer - Siblings, Heart Disease - Father, Hypertension - Father,Mother, No family history of Hereditary Spherocytosis, Kidney Disease, Seizures, Stroke, Thyroid Problems, Tuberculosis. Social History Former smoker - smokeless tobacco, Marital Status - Married, Alcohol Use - Rarely - WINE, Drug Use - No  History, Caffeine Use - Daily - COFFEE. Medical History Ear/Nose/Mouth/Throat Patient has history of Chronic sinus problems/congestion - seasonal allergies Cardiovascular Patient has history  of Hypertension, Peripheral Arterial Disease - STENTS, Peripheral Venous Disease Endocrine Patient has history of Type II Diabetes - last A1c- 8.2 Hospitalization/Surgery History - left shoulder surgery. Medical A Surgical History Notes nd Constitutional Symptoms (General Health) obesity , h/o (R) leg claudication (right fem stent September 2012) Cardiovascular hyperlipidemia Endocrine pt. on diet intentionally losing 20 pounds since December 2016 in an effort to improve A1C levels Integumentary (Skin) psoriatic arthritis Musculoskeletal bilat knee pain identified as an ortho issue psoriatic arthritis Oncologic Prostate cancer 2018 Psychiatric anxiety Objective Constitutional Slightly tachycardic. no acute distress. Vitals Time Taken: 9:01 AM, Height: 72 in, Weight: 208 lbs, BMI: 28.2, Temperature: 98.3 F, Pulse: 103 bpm, Respiratory Rate: 16 breaths/min, Blood Pressure: 136/67 mmHg. Respiratory Normal work of breathing on room air. General Notes: 06/22/2022: The anterior tibial wound is a little bit fibrotic today, but smaller. The medial thigh wound is unchanged in size and also remains somewhat fibrotic as well. Integumentary (Hair, Skin) Wound #3 status is Open. Original cause of wound was Blister. The date acquired was: 09/30/2021. The wound has been in treatment 28 weeks. The wound is located on the Right,Anterior Lower Leg. The wound measures 1.4cm length x 0.5cm width x 0.2cm depth; 0.55cm^2 area and 0.11cm^3 volume. There is Fat Layer (Subcutaneous Tissue) exposed. There is no tunneling or undermining noted. There is a medium amount of serosanguineous drainage noted. The wound margin is distinct with the outline attached to the wound base. There is small (1-33%) red granulation  within the wound bed. There is a large (67-100%) amount of necrotic tissue within the wound bed including Adherent Slough. The periwound skin appearance did not exhibit: Callus, Crepitus, Excoriation, Induration, Rash, Scarring, Dry/Scaly, Maceration, Atrophie Blanche, Cyanosis, Ecchymosis, Hemosiderin Staining, Mottled, Pallor, Rubor, Erythema. Periwound temperature was noted as No Abnormality. Wound #4 status is Open. Original cause of wound was Gradually Appeared. The date acquired was: 01/05/2022. The wound has been in treatment 23 weeks. The wound is located on the Right,Medial Upper Leg. The wound measures 2cm length x 1.2cm width x 0.1cm depth; 1.885cm^2 area and 0.188cm^3 volume. There is Fat Layer (Subcutaneous Tissue) exposed. There is no tunneling or undermining noted. There is a medium amount of serosanguineous drainage noted. The wound margin is distinct with the outline attached to the wound base. There is large (67-100%) pink granulation within the wound bed. There is a small (1-33%) amount of necrotic tissue within the wound bed including Eschar and Adherent Slough. The periwound skin appearance had no abnormalities noted for moisture. The periwound skin appearance had no abnormalities noted for color. The periwound skin appearance exhibited: Scarring. Periwound temperature was noted as TOBIN, WITUCKI ([id number]) [id number] Page 9 of 12 No Abnormality. 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 There was a Excisional Skin/Subcutaneous Tissue Debridement with a total area of 0.55 sq cm performed by Duanne Guess, MD. With the following instrument(s): Curette to remove Non-Viable tissue/material. Material removed includes Subcutaneous Tissue and Slough and after achieving pain control using Lidocaine 4% Topical Solution. No specimens  were taken. A time out was conducted at 09:14, prior to the start of the procedure. A Minimum amount of bleeding was controlled with Pressure. The procedure was tolerated well. Post Debridement Measurements: 1.4cm length x 0.5cm width x 0.2cm depth; 0.11cm^3 volume. Character of Wound/Ulcer Post Debridement is improved. Severity of Tissue Post Debridement is: Fat layer exposed. Post procedure Diagnosis Wound #3: Same as Pre-Procedure General Notes: scribed for Dr. Lady Gary by Samuella Bruin, RN. Wound #4 Pre-procedure diagnosis of Wound #4 is a Dehisced Wound located on the Right,Medial Upper Leg . There was a Selective/Open Wound Non-Viable Tissue Debridement with a total area of 1.88 sq cm performed by Duanne Guess, MD. With the following instrument(s): Curette to remove Non-Viable tissue/material. Material removed includes [facility] ([facility]Rh) after achieving pain control using Lidocaine 4% Topical Solution. No specimens were taken. A time out was conducted at 09:14, prior to the start of the procedure. A Minimum amount of bleeding was controlled with Pressure. The procedure was tolerated well. Post Debridement Measurements: 2cm length x 1.2cm width x 0.1cm depth; 0.188cm^3 volume. Character of Wound/Ulcer Post Debridement is improved. Post procedure Diagnosis Wound #4: Same as Pre-Procedure General Notes: scribed for Dr. Lady Gary by Samuella Bruin, RN. Plan Follow-up Appointments: Return Appointment in 1 week. - Dr. Lady Gary - Room 3 Anesthetic: Wound #3 Right,Anterior Lower  Leg: (In clinic) Topical Lidocaine 4% applied to wound bed Bathing/ Shower/ Hygiene: May shower with protection but do not get wound dressing(s) wet. Protect dressing(s) with water repellant cover (for example, large plastic bag) or a cast cover and may then take shower. Edema Control - Lymphedema / SCD / Other: Elevate legs to the level of the heart or above for 30 minutes daily and/or when sitting for 3-4 times a day throughout the day. Avoid standing for long periods of time. The following medication(s) was prescribed: lidocaine topical 4 % cream cream topical was prescribed at facility WOUND #3: - Lower Leg Wound Laterality: Right, Anterior Cleanser: Soap and Water 1 x Per Week/30 Days Discharge Instructions: May shower and wash wound with dial antibacterial soap and water prior to dressing change. Cleanser: Wound Cleanser 1 x Per Week/30 Days Discharge Instructions: Cleanse the wound with wound cleanser prior to applying a clean dressing using gauze sponges, not tissue or cotton balls. Peri-Wound Care: Zinc Oxide Ointment 30g tube 1 x Per Week/30 Days Discharge Instructions: Apply Zinc Oxide to periwound with each dressing change Peri-Wound Care: Sween Lotion (Moisturizing lotion) 1 x Per Week/30 Days Discharge Instructions: Apply moisturizing lotion as directed Prim Dressing: Maxorb Extra Ag+ Alginate Dressing, 2x2 (in/in) 1 x Per Week/30 Days ary Discharge Instructions: Apply to wound bed as instructed Secondary Dressing: ABD Pad, 8x10 1 x Per Week/30 Days Discharge Instructions: Apply over primary dressing as directed. Secondary Dressing: Woven Gauze Sponge, Non-Sterile 4x4 in 1 x Per Week/30 Days Discharge Instructions: Apply over primary dressing as directed. Secured With: [employer] Size 5, 10 (yds) 1 x Per Week/30 Days Com pression Wrap: Kerlix Roll 4.5x3.1 (in/yd) 1 x Per Week/30 Days Discharge Instructions: Apply Kerlix and Coban compression as directed. Com pression Wrap:  Coban Self-Adherent Wrap 4x5 (in/yd) 1 x Per Week/30 Days Discharge Instructions: Apply over Kerlix as directed. Com pression Wrap: Unnaboot w/Calamine, 4x10 (in/yd) 1 x Per Week/30 Days Discharge Instructions: Apply Unnaboot to top of dressing and around ankles to prevent dressing from sliding WOUND #4: - Upper Leg Wound Laterality: Right, Medial Cleanser: Soap and Water 1 x Per Week/30 Days Discharge Instructions: May  shower and wash wound with dial antibacterial soap and water prior to dressing change. ABEM, SHADDIX ([id number]) [id number] Page 10 of 12 Cleanser: Wound Cleanser 1 x Per Week/30 Days Discharge Instructions: Cleanse the wound with wound cleanser prior to applying a clean dressing using gauze sponges, not tissue or cotton balls. Prim Dressing: Endoform 2x2 in 1 x Per Week/30 Days ary Discharge Instructions: Moisten with hydrogel Secondary Dressing: Zetuvit Plus Silicone Border Dressing 4x4 (in/in) 1 x Per Week/30 Days Discharge Instructions: Apply silicone border over primary dressing as directed. 06/22/2022: The anterior tibial wound is a little bit fibrotic today, but smaller. The medial thigh wound is unchanged in size and also remains somewhat fibrotic as well. I used a curette to debride slough and subcutaneous tissue from the anterior tibial wound and slough from the medial thigh wound. I was fairly aggressive with the medial thigh wound, trying to stimulate the healing cascade. I am going to use endoform in both sites this week. Continue Kerlix and Coban wrap. Electronic Signature(s) Signed: 06/22/2022 9:42:35 AM By: Duanne Guess MD FACS Entered By: Duanne Guess on 06/22/2022 09:42:35 -------------------------------------------------------------------------------- HxROS Details Patient Name: Date of Service: [name], [name] 06/22/2022 9:15 A M Medical Record Number: [id number] Patient Account Number: [id number] Date of  Birth/Sex: Treating RN: 04/02/1950 (72 y.o. M) Primary Care Provider: Kae Heller Other Clinician: Referring Provider: Treating Provider/Extender: Tina Griffiths in Treatment: 28 Information Obtained From Patient Constitutional Symptoms (General Health) Medical History: Past Medical History Notes: obesity , h/o (R) leg claudication (right fem stent September 2012) Ear/Nose/Mouth/Throat Medical History: Positive for: Chronic sinus problems/congestion - seasonal allergies Cardiovascular Medical History: Positive for: Hypertension; Peripheral Arterial Disease - STENTS; Peripheral Venous Disease Past Medical History Notes: hyperlipidemia Endocrine Medical History: Positive for: Type II Diabetes - last A1c- 8.2 Past Medical History Notes: pt. on diet intentionally losing 20 pounds since December 2016 in an effort to improve A1C levels Time with diabetes: 9 YRS Treated with: Insulin Blood sugar tested every day: Yes Tested : 7-8 TIMES Integumentary (Skin) Medical History: Past Medical History Notes: psoriatic arthritis Musculoskeletal Medical History: Past Medical History Notes: bilat knee pain identified as an ortho issue psoriatic arthritis JAKE, FUHRMANN ([id number]) [phone].pdf Page 11 of 12 Oncologic Medical History: Past Medical History Notes: Prostate cancer 2018 Psychiatric Medical History: Past Medical History Notes: anxiety HBO Extended History Items Ear/Nose/Mouth/Throat: Chronic sinus problems/congestion Immunizations Pneumococcal Vaccine: Received Pneumococcal Vaccination: Yes Received Pneumococcal Vaccination On or After 60th Birthday: Yes Implantable Devices None Hospitalization / Surgery History Type of Hospitalization/Surgery left shoulder surgery Family and Social History Cancer: Yes - Siblings; Heart Disease: Yes - Father; Hereditary Spherocytosis: No; Hypertension: Yes - Father,Mother;  Kidney Disease: No; Seizures: No; Stroke: No; Thyroid Problems: No; Tuberculosis: No; Former smoker - smokeless tobacco; Marital Status - Married; Alcohol Use: Rarely - WINE; Drug Use: No History; Caffeine Use: Daily - COFFEE; Financial Concerns: No; Food, Clothing or Shelter Needs: No; Support System Lacking: No; Transportation Concerns: No Electronic Signature(s) Signed: 06/22/2022 10:07:31 AM By: Duanne Guess MD FACS Entered By: Duanne Guess on 06/22/2022 09:40:42 -------------------------------------------------------------------------------- SuperBill Details Patient Name: Date of Service: [name], [name] 06/22/2022 Medical Record Number: [id number] Patient Account Number: [id number] Date of Birth/Sex: Treating RN: [birth year] (72 y.o. M) Primary Care Provider: Kae Heller Other Clinician: Referring Provider: Treating Provider/Extender: Tina Griffiths in Treatment: 28 Diagnosis Coding ICD-10 Codes Code Description [phone] Non-pressure chronic ulcer of other part of right  lower leg with muscle involvement without evidence of necrosis I70.239 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 E11.622 Type 2 diabetes mellitus with other skin ulcer I73.9 Peripheral vascular disease, unspecified L97.122 Non-pressure chronic ulcer of left thigh with fat layer exposed Facility Procedures : CPT4 Code Description: [id number] 11042 - DEB SUBQ TISSUE 20 SQ CM/< ICD-10 Diagnosis Description L97.815 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 involvemen Modifier: t without evidence Quantity: 1 of necrosis Physician Procedures : CPT4 Code Description Modifier [id number] - WC PHYS LEVEL 3 - EST PT 25 ICD-10 Diagnosis Description L97.815 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 without evidence L97.122 Non-pressure chronic ulcer of  left thigh with fat  layer exposed E11.622 Type 2 diabetes mellitus with other skin ulcer I70.239 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 Quantity: 1 of necrosis : [id number] 11042 - WC PHYS SUBQ TISS 20 SQ CM ICD-10 Diagnosis Description L97.815 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

## 2022-06-29 ENCOUNTER — Encounter (HOSPITAL_BASED_OUTPATIENT_CLINIC_OR_DEPARTMENT_OTHER): Payer: Medicare Other | Admitting: General Surgery

## 2022-06-29 DIAGNOSIS — L97122 Non-pressure chronic ulcer of left thigh with fat layer exposed: Secondary | ICD-10-CM | POA: Diagnosis not present

## 2022-06-29 DIAGNOSIS — E11622 Type 2 diabetes mellitus with other skin ulcer: Secondary | ICD-10-CM | POA: Diagnosis not present

## 2022-06-29 DIAGNOSIS — E1151 Type 2 diabetes mellitus with diabetic peripheral angiopathy without gangrene: Secondary | ICD-10-CM | POA: Diagnosis not present

## 2022-06-29 DIAGNOSIS — I70239 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: Secondary | ICD-10-CM | POA: Diagnosis not present

## 2022-06-29 DIAGNOSIS — L97815 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: Secondary | ICD-10-CM | POA: Diagnosis not present

## 2022-06-29 DIAGNOSIS — T8131XA Disruption of external operation (surgical) wound, not elsewhere classified, initial encounter: Secondary | ICD-10-CM | POA: Diagnosis not present

## 2022-06-29 DIAGNOSIS — L97812 Non-pressure chronic ulcer of other part of right lower leg with fat layer exposed: Secondary | ICD-10-CM | POA: Diagnosis not present

## 2022-06-30 NOTE — Progress Notes (Signed)
KELON, EASOM ([id number]) [phone].pdf Page 1 of 8 Visit Report for 06/29/2022 Arrival Information Details Patient Name: Date of Service: Michael [name] [name], [name] 06/29/2022 9:15 A M Medical Record Number: [id number] Patient Account Number: [id number] Date of Birth/Sex: Treating RN: [birth year] (72 y.o. Michael [name] Primary Care Michael [name]: Michael [name] Other Clinician: Referring Teresea Donley: Treating Nykayla Marcelli/Extender: Tina Griffiths in Treatment: 29 Visit Information History Since Last Visit Added or deleted any medications: No Patient Arrived: Ambulatory Any new allergies or adverse reactions: No Arrival Time: 09:19 Had a fall or experienced change in No Accompanied By: wife activities of daily living that may affect Transfer Assistance: None risk of falls: Patient Identification Verified: Yes Signs or symptoms of abuse/neglect since last visito No Secondary Verification Process Completed: Yes Hospitalized since last visit: No Patient Requires Transmission-Based Precautions: No Implantable device outside of the clinic excluding No Patient Has Alerts: Yes cellular tissue based products placed in the center Patient Alerts: Patient on Blood Thinner since last visit: plavix Has Dressing in Place as Prescribed: Yes Has Compression in Place as Prescribed: Yes Pain Present Now: No Electronic Signature(s) Signed: 06/29/2022 4:17:29 PM By: Redmond Pulling RN, [name] Entered By: Redmond Pulling on 06/29/2022 09:19:32 -------------------------------------------------------------------------------- Encounter Discharge Information Details Patient Name: Date of Service: Michael [name] N[name] 06/29/2022 9:15 A M Medical Record Number: [id number] Patient Account Number: [id number] Date of Birth/Sex: Treating RN: Oct 17, 1950 (72 y.o. Michael [name] Primary Care Michael [name]: Michael [name] Other Clinician: Referring Michael [name]: Treating  Michael [name]/Extender: Tina Griffiths in Treatment: 29 Encounter Discharge Information Items Post Procedure Vitals Discharge Condition: Stable Temperature (F): 97.8 Ambulatory Status: Ambulatory Pulse (bpm): 74 Discharge Destination: Home Respiratory Rate (breaths/min): 16 Transportation: Private Auto Blood Pressure (mmHg): 149/76 Accompanied By: wife Schedule Follow-up Appointment: Yes Clinical Summary of Care: Patient Declined Electronic Signature(s) Signed: 06/29/2022 4:17:29 PM By: Redmond Pulling RN, [name] Entered By: Redmond Pulling on 06/29/2022 12:06:48 -------------------------------------------------------------------------------- Lower Extremity Assessment Details Patient Name: Date of Service: Michael [name] N, [name] 06/29/2022 9:15 A M Medical Record Number: [id number] Patient Account Number: [id number] Date of Birth/Sex: Treating RN: 1950-10-22 (72 y.o. Michael [name] Primary Care Mekiah Wahler: Michael [name] Other Clinician: Referring Conn Trombetta: Treating Rishard Delange/Extender: Tina Griffiths in Treatment: 29 Edema Assessment H[Left: Michael [name] ([id number])] Franne Forts: [id number] Page 2 of 8] Assessed: [Left: No] [Right: No] Edema: [Left: N] [Right: o] Calf Left: Right: Point of Measurement: 35 cm From Medial Instep 34.8 cm Ankle Left: Right: Point of Measurement: 12 cm From Medial Instep 21.7 cm Vascular Assessment Pulses: Dorsalis Pedis Palpable: [Right:Yes] Electronic Signature(s) Signed: 06/29/2022 4:17:29 PM By: Redmond Pulling RN, [name] Entered By: Redmond Pulling on 06/29/2022 09:35:52 -------------------------------------------------------------------------------- Multi Wound Chart Details Patient Name: Date of Service: [name] N[name] 06/29/2022 9:15 A M Medical Record Number: [id number] Patient Account Number: [id number] Date of Birth/Sex: Treating RN: Nov 04, 1950 (72 y.o. M) Primary Care  Michael [name]: Michael [name] Other Clinician: Referring Tyquasia Pant: Treating Beth Goodlin/Extender: Tina Griffiths in Treatment: 29 Vital Signs Height(in): 72 Pulse(bpm): 94 Weight(lbs): 208 Blood Pressure(mmHg): 149/76 Body Mass Index(BMI): 28.2 Temperature(F): 97.9 Respiratory Rate(breaths/min): 16 [3:Photos:] [N/A:N/A] Right, Anterior Lower Leg Right, Medial Upper Leg N/A Wound Location: Blister Gradually Appeared N/A Wounding Event: Diabetic Wound/Ulcer of the Lower Dehisced Wound N/A Primary Etiology: Extremity Chronic sinus problems/congestion, Chronic sinus problems/congestion, N/A Comorbid History: Hypertension, Peripheral Arterial Hypertension, Peripheral Arterial Disease, Peripheral Venous Disease, Disease, Peripheral Venous Disease, Type II Diabetes  Type II Diabetes 09/30/2021 01/05/2022 N/A Date Acquired: 29 24 N/A Weeks of Treatment: Open Open N/A Wound Status: No No N/A Wound Recurrence: 1.4x0.7x0.2 1.6x1x0.1 N/A Measurements L x W x D (cm) 0.77 1.257 N/A A (cm) : rea 0.154 0.126 N/A Volume (cm) : 95.60% N/A N/A % Reduction in Area: 97.10% N/A N/A % Reduction in Volume: Grade 2 Full Thickness Without Exposed N/A Classification: Support Structures Medium Medium N/A Exudate Amount: Serosanguineous Serosanguineous N/A Exudate Type: red, brown red, brown N/A Exudate Color: Distinct, outline attached Distinct, outline attached N/A Wound Margin: Large (67-100%) Large (67-100%) N/A Granulation Amount: Michael [name], Michael [name] ([id number]) [id number] Page 3 of 8 Red Pink N/A Granulation Quality: Small (1-33%) Small (1-33%) N/A Necrotic Amount: Adherent Slough Eschar, Adherent Slough N/A Necrotic Tissue: Fat Layer (Subcutaneous Tissue): Yes Fat Layer (Subcutaneous Tissue): Yes N/A Exposed Structures: Fascia: No Fascia: No Tendon: No Tendon: No Muscle: No Muscle: No Joint: No Joint: No Bone: No Bone:  No Medium (34-66%) Small (1-33%) N/A Epithelialization: Debridement - Selective/Open Wound Debridement - Selective/Open Wound N/A Debridement: Pre-procedure Verification/Time Out 09:40 09:40 N/A Taken: Lidocaine 5% topical ointment Lidocaine 5% topical ointment N/A Pain Control: Necrotic/Eschar, Ambulance person, Slough N/A Tissue Debrided: Non-Viable Tissue Non-Viable Tissue N/A Level: 0.77 2.51 N/A Debridement A (sq cm): rea Curette Curette N/A Instrument: Minimum Minimum N/A Bleeding: Pressure Pressure N/A Hemostasis A chieved: 0 0 N/A Procedural Pain: 0 0 N/A Post Procedural Pain: Procedure was tolerated well Procedure was tolerated well N/A Debridement Treatment Response: 1.4x0.7x0.2 1.6x1x0.1 N/A Post Debridement Measurements L x W x D (cm) 0.154 0.126 N/A Post Debridement Volume: (cm) Excoriation: No Scarring: Yes N/A Periwound Skin Texture: Induration: No Callus: No Crepitus: No Rash: No Scarring: No Maceration: No No Abnormalities Noted N/A Periwound Skin Moisture: Dry/Scaly: No Atrophie Blanche: No No Abnormalities Noted N/A Periwound Skin Color: Cyanosis: No Ecchymosis: No Erythema: No Hemosiderin Staining: No Mottled: No Pallor: No Rubor: No No Abnormality No Abnormality N/A Temperature: Debridement Debridement N/A Procedures Performed: Treatment Notes Electronic Signature(s) Signed: 06/29/2022 9:51:48 AM By: Duanne Guess MD FACS Entered By: Duanne Guess on 06/29/2022 09:51:48 -------------------------------------------------------------------------------- Multi-Disciplinary Care Plan Details Patient Name: Date of Service: [name] N[name] 06/29/2022 9:15 A M Medical Record Number: [id number] Patient Account Number: [id number] Date of Birth/Sex: Treating RN: [birth year] (72 y.o. Michael [name] Primary Care Jaymz Traywick: Michael [name] Other Clinician: Referring Maurion Walkowiak: Treating Irelynd Zumstein/Extender: Tina Griffiths in Treatment: 29 Active Inactive Nutrition Nursing Diagnoses: Potential for alteratiion in Nutrition/Potential for imbalanced nutrition Goals: Patient/caregiver agrees to and verbalizes understanding of need to use nutritional supplements and/or vitamins as prescribed Date Initiated: 12/04/2021 Target Resolution Date: 10/29/2022 Goal Status: Active Patient/caregiver verbalizes understanding of need to maintain therapeutic glucose control per primary care physician Date Initiated: 12/04/2021 Target Resolution Date: 10/29/2022 Goal Status: Active Michael [name], Michael [name] ([id number]) [phone].pdf Page 4 of 8 Interventions: Assess patient nutrition upon admission and as needed per policy Provide education on elevated blood sugars and impact on wound healing Provide education on nutrition Treatment Activities: Education provided on Nutrition : 01/19/2022 Notes: Wound/Skin Impairment Nursing Diagnoses: Impaired tissue integrity Knowledge deficit related to ulceration/compromised skin integrity Goals: Patient/caregiver will verbalize understanding of skin care regimen Date Initiated: 12/04/2021 Date Inactivated: 02/02/2022 Target Resolution Date: 01/28/2022 Goal Status: Met Ulcer/skin breakdown will have a volume reduction of 30% by week 4 Date Initiated: 12/04/2021 Date Inactivated: 01/07/2022 Target Resolution Date: 12/26/2021 Goal Status: Met Ulcer/skin breakdown will have a volume reduction of 50%  by week 8 Date Initiated: 01/07/2022 Date Inactivated: 03/16/2022 Target Resolution Date: 04/01/2022 Goal Status: Met Ulcer/skin breakdown will have a volume reduction of 80% by week 12 Date Initiated: 03/16/2022 Target Resolution Date: 10/29/2022 Goal Status: Active Interventions: Assess ulceration(s) every visit Provide education on ulcer and skin care Notes: Electronic Signature(s) Signed: 06/29/2022 4:17:29 PM By: Redmond Pulling RN,  [name] Entered By: Redmond Pulling on 06/29/2022 09:42:53 -------------------------------------------------------------------------------- Pain Assessment Details Patient Name: Date of Service: [name] N[name] 06/29/2022 9:15 A M Medical Record Number: [id number] Patient Account Number: [id number] Date of Birth/Sex: Treating RN: 04-Nov-1950 (72 y.o. Michael [name] Primary Care Lakai Moree: Michael [name] Other Clinician: Referring Alishba Naples: Treating Charlann Wayne/Extender: Tina Griffiths in Treatment: 29 Active Problems Location of Pain Severity and Description of Pain Patient Has Paino No Site Locations Michael [name], Michael [name] ([id number]) [id number] Page 5 of 8 Pain Management and Medication Current Pain Management: Electronic Signature(s) Signed: 06/29/2022 4:17:29 PM By: Redmond Pulling RN, [name] Entered By: Redmond Pulling on 06/29/2022 09:20:19 -------------------------------------------------------------------------------- Patient/Caregiver Education Details Patient Name: Date of Service: Michael [name] Dorris [name] 4/29/2024andnbsp9:15 A M Medical Record Number: [id number] Patient Account Number: [id number] Date of Birth/Gender: Treating RN: 11-03-1950 (72 y.o. Michael [name] Primary Care Physician: Michael [name] Other Clinician: Referring Physician: Treating Physician/Extender: Tina Griffiths in Treatment: 29 Education Assessment Education Provided To: Patient Education Topics Provided Wound/Skin Impairment: Methods: Explain/Verbal Responses: State content correctly [employer]) Signed: 06/29/2022 4:17:29 PM By: Redmond Pulling RN, [name] Entered By: Redmond Pulling on 06/29/2022 09:43:22 -------------------------------------------------------------------------------- Wound Assessment Details Patient Name: Date of Service: Michael [name] N[name] 06/29/2022 9:15 A M Medical Record Number: [id number] Patient  Account Number: [id number] Date of Birth/Sex: Treating RN: 1950/12/23 (72 y.o. Michael [name] Primary Care Rocquel Askren: Michael [name] Other Clinician: Referring Winter Trefz: Treating Francenia Chimenti/Extender: Tina Griffiths in Treatment: 29 Wound Status Wound Number: 3 Primary Diabetic Wound/Ulcer of the Lower Extremity Etiology: Wound Location: Right, Anterior Lower Leg Wound Open Wounding Event: Blister Status: Date Acquired: 09/30/2021 Comorbid Chronic sinus problems/congestion, Hypertension, Peripheral Weeks Of Treatment: 29 History: Arterial Disease, Peripheral Venous Disease, Type II Diabetes Clustered Wound: No Photos Wound Measurements Marseille, Harper C ([id number]) Length: (cm) 1.4 Width: (cm) 0.7 Depth: (cm) 0.2 Area: (cm) 0.77 Volume: (cm) 0.154 [id number] Page 6 of 8 % Reduction in Area: 95.6% % Reduction in Volume: 97.1% Epithelialization: Medium (34-66%) Tunneling: No Undermining: No Wound Description Classification: Grade 2 Wound Margin: Distinct, outline attached Exudate Amount: Medium Exudate Type: Serosanguineous Exudate Color: red, brown Foul Odor After Cleansing: No Slough/Fibrino Yes Wound Bed Granulation Amount: Large (67-100%) Exposed Structure Granulation Quality: Red Fascia Exposed: No Necrotic Amount: Small (1-33%) Fat Layer (Subcutaneous Tissue) Exposed: Yes Necrotic Quality: Adherent Slough Tendon Exposed: No Muscle Exposed: No Joint Exposed: No Bone Exposed: No Periwound Skin Texture Texture Color No Abnormalities Noted: No No Abnormalities Noted: No Callus: No Atrophie Blanche: No Crepitus: No Cyanosis: No Excoriation: No Ecchymosis: No Induration: No Erythema: No Rash: No Hemosiderin Staining: No Scarring: No Mottled: No Pallor: No Moisture Rubor: No No Abnormalities Noted: No Dry / Scaly: No Temperature / Pain Maceration: No Temperature: No Abnormality Treatment  Notes Wound #3 (Lower Leg) Wound Laterality: Right, Anterior Cleanser Soap and Water Discharge Instruction: May shower and wash wound with dial antibacterial soap and water prior to dressing change. Wound Cleanser Discharge Instruction: Cleanse the wound with wound cleanser prior to applying a clean dressing using gauze sponges, not tissue  or cotton balls. Peri-Wound Care Zinc Oxide Ointment 30g tube Discharge Instruction: Apply Zinc Oxide to periwound with each dressing change Sween Lotion (Moisturizing lotion) Discharge Instruction: Apply moisturizing lotion as directed Topical Primary Dressing Endoform 2x2 in Discharge Instruction: Moisten with saline Secondary Dressing ABD Pad, 8x10 Discharge Instruction: Apply over primary dressing as directed. Woven Gauze Sponge, Non-Sterile 4x4 in Discharge Instruction: Apply over primary dressing as directed. Secured With [employer] Size 5, 10 (yds) Compression Wrap Kerlix Roll 4.5x3.1 (in/yd) Discharge Instruction: Apply Kerlix and Coban compression as directed. Coban Self-Adherent Wrap 4x5 (in/yd) Discharge Instruction: Apply over Kerlix as directed. Unnaboot w/Calamine, 4x10 (in/yd) Michael [name], Michael [name] ([id number]) [id number] Page 7 of 8 Discharge Instruction: Apply Unnaboot to top of dressing and around ankles to prevent dressing from sliding Compression Stockings Add-Ons Electronic Signature(s) Signed: 06/29/2022 4:17:29 PM By: Redmond Pulling RN, [name] Entered By: Redmond Pulling on 06/29/2022 09:34:55 -------------------------------------------------------------------------------- Wound Assessment Details Patient Name: Date of Service: Michael [name] N, [name] 06/29/2022 9:15 A M Medical Record Number: [id number] Patient Account Number: [id number] Date of Birth/Sex: Treating RN: Aug 22, 1950 (72 y.o. Michael [name] Primary Care Brenyn Petrey: Michael [name] Other Clinician: Referring Ondria Oswald: Treating  Elvin Mccartin/Extender: Tina Griffiths in Treatment: 29 Wound Status Wound Number: 4 Primary Dehisced Wound Etiology: Wound Location: Right, Medial Upper Leg Wound Open Wounding Event: Gradually Appeared Status: Date Acquired: 01/05/2022 Comorbid Chronic sinus problems/congestion, Hypertension, Peripheral Weeks Of Treatment: 24 History: Arterial Disease, Peripheral Venous Disease, Type II Diabetes Clustered Wound: No Photos Wound Measurements Length: (cm) 1.6 Width: (cm) 1 Depth: (cm) 0.1 Area: (cm) 1.257 Volume: (cm) 0.126 % Reduction in Area: % Reduction in Volume: Epithelialization: Small (1-33%) Tunneling: No Undermining: No Wound Description Classification: Full Thickness Without Exposed Support Structures Wound Margin: Distinct, outline attached Exudate Amount: Medium Exudate Type: Serosanguineous Exudate Color: red, brown Foul Odor After Cleansing: No Slough/Fibrino Yes Wound Bed Granulation Amount: Large (67-100%) Exposed Structure Granulation Quality: Pink Fascia Exposed: No Necrotic Amount: Small (1-33%) Fat Layer (Subcutaneous Tissue) Exposed: Yes Necrotic Quality: Eschar, Adherent Slough Tendon Exposed: No Muscle Exposed: No Joint Exposed: No Bone Exposed: No Periwound Skin Texture Texture Color No Abnormalities Noted: No No Abnormalities Noted: Yes Scarring: Yes Temperature / Pain Temperature: No Abnormality Moisture No Abnormalities NotedSTONEY, Michael [name] ([id number]) [id number] Page 8 of 8 Treatment Notes Wound #4 (Upper Leg) Wound Laterality: Right, Medial Cleanser Soap and Water Discharge Instruction: May shower and wash wound with dial antibacterial soap and water prior to dressing change. Wound Cleanser Discharge Instruction: Cleanse the wound with wound cleanser prior to applying a clean dressing using gauze sponges, not tissue or cotton balls. Peri-Wound Care Topical Primary  Dressing Endoform 2x2 in Discharge Instruction: Moisten with hydrogel Secondary Dressing Zetuvit Plus Silicone Border Dressing 4x4 (in/in) Discharge Instruction: Apply silicone border over primary dressing as directed. Secured With Compression Wrap Compression Stockings Facilities manager) Signed: 06/29/2022 4:17:29 PM By: Redmond Pulling RN, [name] Entered By: Redmond Pulling on 06/29/2022 09:37:26 -------------------------------------------------------------------------------- Vitals Details Patient Name: Date of Service: Michael [name] N, [name] 06/29/2022 9:15 A M Medical Record Number: [id number] Patient Account Number: [id number] Date of Birth/Sex: Treating RN: [birth year] (72 y.o. Michael [name] Primary Care [name]: Michael [name] Other Clinician: Referring Peony Barner: Treating Jaeden Westbay/Extender: Tina Griffiths in Treatment: 29 Vital Signs Time Taken: 09:18 Temperature (F): 97.9 Height (in): 72 Pulse (bpm): 94 Weight (lbs): 208 Respiratory Rate (breaths/min): 16 Body Mass Index (BMI): 28.2 Blood Pressure (mmHg): 149/76 Reference  Range: 80 - 120 mg / dl Electronic Signature(s) Signed: 06/29/2022 4:17:29 PM By: Redmond Pulling RN, [name] Entered By: Redmond Pulling on 06/29/2022 09:19:59

## 2022-06-30 NOTE — Progress Notes (Signed)
Michael [name], Michael [name] ([id number]) [id number] Page 1 of 12 Visit Report for 06/29/2022 Chief Complaint Document Details Patient Name: Date of Service: Michael FFMA [place], [name] 06/29/2022 9:15 A M Medical Record Number: [id number] Patient Account Number: [id number] Date of Birth/Sex: Treating RN: 21-Nov-1950 (72 y.o. M) Primary Care Provider: Kae Heller Other Clinician: Referring Provider: Treating Provider/Extender: Tina Griffiths in Treatment: 29 Information Obtained from: Patient Chief Complaint Patient presents to the wound care center today with an open arterial ulcer to the right lower extremity in the setting of diabetes mellitus. he has had this problem for 7 months 12/04/2021: ulcer to right lower anterior tibial surface Electronic Signature(s) Signed: 06/29/2022 9:51:57 AM By: Duanne Guess MD FACS Entered By: Duanne Guess on 06/29/2022 09:51:57 -------------------------------------------------------------------------------- Debridement Details Patient Name: Date of Service: Michael [name], Michael LTER C. 06/29/2022 9:15 A M Medical Record Number: [id number] Patient Account Number: [id number] Date of Birth/Sex: Treating RN: 03-21-1950 (72 y.o. Cline Cools Primary Care Provider: Kae Heller Other Clinician: Referring Provider: Treating Provider/Extender: Tina Griffiths in Treatment: 29 Debridement Performed for Assessment: Wound #4 Right,Medial Upper Leg Performed By: Physician Duanne Guess, MD Debridement Type: Debridement Level of Consciousness (Pre-procedure): Awake and Alert Pre-procedure Verification/Time Out Yes - 09:40 Taken: Start Time: 09:42 Pain Control: Lidocaine 5% topical ointment Percent of Wound Bed Debrided: 200% T Area Debrided (cm): otal 2.51 Tissue and other material debrided: Non-Viable, Eschar, Slough, Slough Level: Non-Viable Tissue Debridement Description:  Selective/Open Wound Instrument: Curette Bleeding: Minimum Hemostasis Achieved: Pressure Procedural Pain: 0 Post Procedural Pain: 0 Response to Treatment: Procedure was tolerated well Level of Consciousness (Post- Awake and Alert procedure): Post Debridement Measurements of Total Wound Length: (cm) 1.6 Width: (cm) 1 Depth: (cm) 0.1 Volume: (cm) 0.126 Character of Wound/Ulcer Post Debridement: Improved Post Procedure Diagnosis Same as Pre-procedure Notes Scribed for Dr Lady Gary by Redmond Pulling, RN Electronic Signature(s) [name] ([id number]) [phone].pdf Page 2 of 12 Signed: 06/29/2022 4:00:45 PM By: Duanne Guess MD FACS Signed: 06/29/2022 4:17:29 PM By: Redmond Pulling RN, [name] Entered By: Redmond Pulling on 06/29/2022 09:45:30 -------------------------------------------------------------------------------- Debridement Details Patient Name: Date of Service: Michael [name], Michael LTER C. 06/29/2022 9:15 A M Medical Record Number: [id number] Patient Account Number: [id number] Date of Birth/Sex: Treating RN: [birth year] (72 y.o. Cline Cools Primary Care Provider: Kae Heller Other Clinician: Referring Provider: Treating Provider/Extender: Tina Griffiths in Treatment: 29 Debridement Performed for Assessment: Wound #3 Right,Anterior Lower Leg Performed By: Physician Duanne Guess, MD Debridement Type: Debridement Severity of Tissue Pre Debridement: Fat layer exposed Level of Consciousness (Pre-procedure): Awake and Alert Pre-procedure Verification/Time Out Yes - 09:40 Taken: Start Time: 09:42 Pain Control: Lidocaine 5% topical ointment Percent of Wound Bed Debrided: 100% T Area Debrided (cm): otal 0.77 Tissue and other material debrided: Non-Viable, Eschar, Slough, Slough Level: Non-Viable Tissue Debridement Description: Selective/Open Wound Instrument: Curette Bleeding: Minimum Hemostasis Achieved:  Pressure Procedural Pain: 0 Post Procedural Pain: 0 Response to Treatment: Procedure was tolerated well Level of Consciousness (Post- Awake and Alert procedure): Post Debridement Measurements of Total Wound Length: (cm) 1.4 Width: (cm) 0.7 Depth: (cm) 0.2 Volume: (cm) 0.154 Character of Wound/Ulcer Post Debridement: Improved Severity of Tissue Post Debridement: Fat layer exposed Post Procedure Diagnosis Same as Pre-procedure Notes Scribed for Dr Lady Gary by Redmond Pulling, RN Electronic Signature(s) Signed: 06/29/2022 4:00:45 PM By: Duanne Guess MD FACS Signed: 06/29/2022 4:17:29 PM By: Redmond Pulling RN, [name] Entered By: Redmond Pulling on 06/29/2022 09:45:47 --------------------------------------------------------------------------------  HPI Details Patient Name: Date of Service: Michael [employer], [place] 06/29/2022 9:15 A M Medical Record Number: [id number] Patient Account Number: [id number] Date of Birth/Sex: Treating RN: 1950-05-11 (72 y.o. M) Primary Care Provider: Kae Heller Other Clinician: Referring Provider: Treating Provider/Extender: Tina Griffiths in Treatment: 29 History of Present Illness Location: right lateral calf closer to the knee Quality: Patient reports experiencing a dull pain to affected area(s). Severity: Patient states wound(s) are getting worse. Duration: Patient has had the wound for > 7 months prior to seeking treatment at the wound center Timing: Pain in wound is constant (hurts all the time) Michael [name], Michael [name] ([id number]) [id number] Page 3 of 12 Context: The wound would happen gradually Modifying Factors: Patient wound(s)/ulcer(s) are worsening due to : no resolution and a white material at the base of the wound ssociated Signs and Symptoms: Patient reports having:no discharge or purulent material A HPI Description: 72 year old gentleman who has been referred to was from his PCP for a chronic  ulceration on his right lower extremity which she's had for several weeks. Past medical history significant for diabetes mellitus type 2, hypertension, hyperlipidemia, anxiety, obesity, peripheral vascular disease and chronic kidney disease. He has never been a smoker. Most recent lab work done at his PCPs office showed a glucose of 217 milligrams per deciliter which is consistent with hyperglycemia. In January 2017 recent hemoglobin A1c values noted to be 8.2%. In April 2017, he has been seen at the vascular office by Dr. Myra Gianotti and [name] for right leg claudication. He had a right superficial femoral artery stent in September 2012. Recent noninvasive vascular imaging done on 06/24/2015 showed a right ABI of 1.07 with a triphasic waveform and a right TBI of 0.81. The left was noncompressible with a triphasic waveform and a TBI of 1.17. Right lower extremity arterial duplex was unable to identify stent exit but they were elevated velocities at the stent and in the distal thigh with triphasic waveforms. [name] opinion was to return to the clinic in 6 months with ABI and right lower extremity arterial duplex studies to be done. He has seen a dermatologist who had done a biopsy and said it was benign and he was given a steroid cream. 10/23/2015 -- Pathology report of the biopsy done in April 2017 shows ulcer with underlying angiodermatitis consistent with stasis dermatitis. 10/30/2015 -- he is going for shoulder surgery in about 10 days' time and was asking about the perioperative care. His blood sugars are running in the high 100s or low 200s. No hemoglobin A1c done recently. 11/20/2015 -- is back up to 2 weeks because of recent left shoulder surgery. 12/04/15; patient returns today with the wound for the most part looking healthy. No evidence of infection no debridement required. He is using Prisma for 4 weeks, without obvious improvement per her intake nurse. This started as several small  open areas that were raised erythematous. He saw dermatology and at some point this was biopsied that just suggested stasis skin physiology. This certainly doesn't look like that 12/11/15; using Hydrafera Blue. Previous biopsy reviewed, no atypia PAS negative. He has a history of stents in the right leg however he does not appear to have primary arterial insufficiency ABI in this clinic was over 0.9. 12/25/2015 -- he has been approved for grafix and we will order for some to be applied next week 01/01/2016 -- he has had his first application of Grafix today 01/08/2016 -- he has had his  second application of Grafix today READMISSION 12/04/2021 This is a now 72 year old man who was followed in our clinic about 5 years ago for an ulceration on his right lateral lower leg, just distal to the knee. He has since undergone a number of revascularization procedures that have been complicated by restenosis and wound infections. During the course of this treatment, he developed a small ulcer on his right anterior tibial surface. Despite use of an Unna boot, the wound has continued to expand. He was referred to the wound care center by Dr. Myra Gianotti for further evaluation and management. On the right anterior tibial surface, there is an irregular wound with heavy slough and eschar accumulation. The periwound skin is intact but he does have 1-2+ pitting edema. There is no purulent drainage or malodor. 10/13; second visit for this man who has an ischemic wound in the setting of type 2 diabetes on the right anterior mid tibia area. He has been revascularized. Using Santyl gent 12/22/2021: The wound is about the same size this week. There is a lot of gray sloughy material on the surface, secondary to silver nitrate used to stop bleeding after what sounds like a fairly aggressive debridement last week. 12/30/2021: The wound is smaller this week and a bit cleaner. Edema control is excellent. There is still a bit of  slough accumulation. 01/07/2022: The wound continues to contract and is much cleaner this week. Edema control is very good. He has small openings in his upper leg surgical scars, but he is going to be seeing Dr. Myra Gianotti next Monday and will have him take a look. He has been applying manuka honey to both of the sites. We have been using Santyl and gentamicin on his lower leg wound. 01/14/2022: He saw vascular surgery this week and was told that they were happy to have [place] manage the 2 wounds from his operation. Both of these have a light layer of slough on the surface. The more distal wound is a little bit dry. The anterior tibial wound continues to accumulate a fair amount of slough. Edema control is good. 01/19/2022: Both upper leg wounds are smaller. The more distal is quite dry and cracked when I was examining it. The lower leg wound is more painful and the surrounding tissue is erythematous and indurated. 01/26/2022: The upper leg wounds are healed. The skin at the distal leg wound is quite dry. The lower leg wound is cleaner but still fairly painful. His wrap slid again this week. 02/02/2022: We had good success keeping his wrap intact using a standard Unna boot. The lower leg wound continues to contract and is clean and less painful this week. 12/11; TheraSkin #1 12/18; TheraSkin #2. Wound looks as though it is contracted. Patient states that his pain was better in the leg. Secondary dressing and kerlix Coban 02/24/2022: The wound is smaller since I saw it last. It is less painful and there is good granulation tissue, including over the exposed tendon. 03/09/2022: The wound continues to contract. There is continued fill of the wound cavity with granulation tissue. 03/16/2022: The wound is smaller again today. There is good granulation tissue forming, covering the tendon. 03/30/2022: The wound continues to contract and fill with good granulation tissue. The granulation tissue is oozing a little bit  at the lateral edge. 04/13/2022: We left his TheraSkin on an extra week because it was looking so good. The wound is smaller this week and flush with the surrounding skin. His medial thigh wound has  opened up again. There is slough and eschar present. 04/20/2022: The anterior tibial wound continues to contract. There is good granulation tissue with just a light layer of slough. The medial thigh ulcer is superficial with slough present. It is a bit fibrotic due to being in a scar. 04/27/2022: The anterior tibial wound continues to contract. There is still some slough buildup with good granulation tissue. The medial thigh ulcer is very leathery and dry. 05/04/2022: The anterior tibial wound is even smaller today. There is no visible tendon at all. He has some slough and eschar accumulation. The medial thigh ulcer continues to be quite dry with a leathery surface. Michael [name], Michael [name] ([id number]) [id number] Page 4 of 12 05/11/2022: The anterior tibial wound continues to contract. There is a little bit of eschar and slough buildup. The granulation tissue is also a bit hypertrophic. The medial thigh ulcer is dry and leathery, once again. 05/25/2022: The anterior tibial wound is quite a bit smaller this week. There is a little bit of eschar and slough. No reaccumulation of hypertrophic granulation tissue. The medial thigh ulcer has slightly improved tissue quality. It is still somewhat fibrotic, but there is at least a light pink color to the tissue. 06/01/2022: Both wounds are smaller. There is hypertrophic granulation tissue on the anterior tibial wound. The quality of the tissue continues to improve on the medial thigh ulcer. 06/08/2022: The anterior tibial wound got a little bit macerated around the edges and the wound measures a bit larger; there is still some hypertrophic granulation tissue present. The medial thigh ulcer is measuring smaller and the quality of tissue continues to  improve. There is some slough and eschar present. 06/15/2022: The anterior tibial wound looks substantially better. The periwound skin has improved and the hypertrophic granulation tissue has resolved. There is some slough and eschar on the surface. The medial thigh ulcer is about the same size, but with continued improvement in the tissue quality. Slough and eschar is also present here. 06/22/2022: The anterior tibial wound is a little bit fibrotic today, but smaller. The medial thigh wound is unchanged in size and also remains somewhat fibrotic as well. 06/29/2022: Both wounds are smaller today. There is a little bit of eschar and slough on both. In particular, the moisture balance of the thigh wound has improved significantly. Electronic Signature(s) Signed: 06/29/2022 9:52:26 AM By: Duanne Guess MD FACS Entered By: Duanne Guess on 06/29/2022 09:52:26 -------------------------------------------------------------------------------- Physical Exam Details Patient Name: Date of Service: Michael [name], Michael LTER C. 06/29/2022 9:15 A M Medical Record Number: [id number] Patient Account Number: [id number] Date of Birth/Sex: Treating RN: 08/29/1950 (72 y.o. M) Primary Care Provider: Kae Heller Other Clinician: Referring Provider: Treating Provider/Extender: Tina Griffiths in Treatment: 29 Constitutional Slightly hypertensive. . . . no acute distress. Respiratory Normal work of breathing on room air. Notes 06/29/2022: Both wounds are smaller today. There is a little bit of eschar and slough on both. In particular, the moisture balance of the thigh wound has improved significantly. Electronic Signature(s) Signed: 06/29/2022 9:53:10 AM By: Duanne Guess MD FACS Entered By: Duanne Guess on 06/29/2022 09:53:10 -------------------------------------------------------------------------------- Physician Orders Details Patient Name: Date of Service: Michael [name], Michael LTER  C. 06/29/2022 9:15 A M Medical Record Number: [id number] Patient Account Number: [id number] Date of Birth/Sex: Treating RN: 06-08-1950 (72 y.o. Cline Cools Primary Care Provider: Kae Heller Other Clinician: Referring Provider: Treating Provider/Extender: Tina Griffiths in Treatment: 53 Verbal / Phone Orders: No  Diagnosis Coding ICD-10 Coding Code Description L97.815 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 I70.239 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 E11.622 Type 2 diabetes mellitus with other skin ulcer I73.9 Peripheral vascular disease, unspecified L97.122 Non-pressure chronic ulcer of left thigh with fat layer exposed Michael [name], Michael [name] ([id number]) [phone].pdf Page 5 of 12 Follow-up Appointments ppointment in 1 week. - Dr. Lady Gary - Room 3 Return A Anesthetic Wound #3 Right,Anterior Lower Leg (In clinic) Topical Lidocaine 4% applied to wound bed Bathing/ Shower/ Hygiene May shower with protection but do not get wound dressing(s) wet. Protect dressing(s) with water repellant cover (for example, large plastic bag) or a cast cover and may then take shower. Edema Control - Lymphedema / SCD / Other Right Lower Extremity Elevate legs to the level of the heart or above for 30 minutes daily and/or when sitting for 3-4 times a day throughout the day. Avoid standing for long periods of time. Wound Treatment Wound #3 - Lower Leg Wound Laterality: Right, Anterior Cleanser: Soap and Water 1 x Per Week/30 Days Discharge Instructions: May shower and wash wound with dial antibacterial soap and water prior to dressing change. Cleanser: Wound Cleanser 1 x Per Week/30 Days Discharge Instructions: Cleanse the wound with wound cleanser prior to applying a clean dressing using gauze sponges, not tissue or cotton  balls. Peri-Wound Care: Zinc Oxide Ointment 30g tube 1 x Per Week/30 Days Discharge Instructions: Apply Zinc Oxide to periwound with each dressing change Peri-Wound Care: Sween Lotion (Moisturizing lotion) 1 x Per Week/30 Days Discharge Instructions: Apply moisturizing lotion as directed Prim Dressing: Endoform 2x2 in 1 x Per Week/30 Days ary Discharge Instructions: Moisten with saline Secondary Dressing: ABD Pad, 8x10 1 x Per Week/30 Days Discharge Instructions: Apply over primary dressing as directed. Secondary Dressing: Woven Gauze Sponge, Non-Sterile 4x4 in 1 x Per Week/30 Days Discharge Instructions: Apply over primary dressing as directed. Secured With: [employer] Size 5, 10 (yds) 1 x Per Week/30 Days Compression Wrap: Kerlix Roll 4.5x3.1 (in/yd) 1 x Per Week/30 Days Discharge Instructions: Apply Kerlix and Coban compression as directed. Compression Wrap: Coban Self-Adherent Wrap 4x5 (in/yd) 1 x Per Week/30 Days Discharge Instructions: Apply over Kerlix as directed. Compression Wrap: Unnaboot w/Calamine, 4x10 (in/yd) 1 x Per Week/30 Days Discharge Instructions: Apply Unnaboot to top of dressing and around ankles to prevent dressing from sliding Wound #4 - Upper Leg Wound Laterality: Right, Medial Cleanser: Soap and Water 1 x Per Week/30 Days Discharge Instructions: May shower and wash wound with dial antibacterial soap and water prior to dressing change. Cleanser: Wound Cleanser 1 x Per Week/30 Days Discharge Instructions: Cleanse the wound with wound cleanser prior to applying a clean dressing using gauze sponges, not tissue or cotton balls. Prim Dressing: Endoform 2x2 in 1 x Per Week/30 Days ary Discharge Instructions: Moisten with hydrogel Secondary Dressing: Zetuvit Plus Silicone Border Dressing 4x4 (in/in) 1 x Per Week/30 Days Discharge Instructions: Apply silicone border over primary dressing as directed. Patient Medications llergies: Actos, Demerol, glimepiride,  nabumetone, oxycodone HCl, penicillin A Notifications Medication Indication Start End 06/29/2022 lidocaine DOSE topical 5 % ointment - ointment topical once daily Electronic Signature(s) Signed: 06/29/2022 4:00:45 PM By: Duanne Guess MD FACS Michael [name], Michael [name] ([id number]) By: Duanne Guess MD FACS [phone].pdf Page 6 of 12 Signed: 06/29/2022 4:00:45 PM Entered By: Duanne Guess on 06/29/2022 09:53:32 -------------------------------------------------------------------------------- Problem List Details Patient Name: Date of Service: Michael  FFMA [name], [place] LTER C. 06/29/2022 9:15 A M Medical Record Number: [id number] Patient Account Number: [id number] Date of Birth/Sex: Treating RN: 13-Sep-1950 (72 y.o. M) Primary Care Provider: Kae Heller Other Clinician: Referring Provider: Treating Provider/Extender: Tina Griffiths in Treatment: 29 Active Problems ICD-10 Encounter Code Description Active Date MDM Diagnosis L97.815 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 12/04/2021 No Yes involvement without evidence of necrosis I70.239 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 12/04/2021 No Yes E11.622 Type 2 diabetes mellitus with other skin ulcer 12/04/2021 No Yes I73.9 Peripheral vascular disease, unspecified 12/04/2021 No Yes L97.122 Non-pressure chronic ulcer of left thigh with fat layer exposed 01/14/2022 No Yes Inactive Problems Resolved Problems Electronic Signature(s) Signed: 06/29/2022 9:51:36 AM By: Duanne Guess MD FACS Entered By: Duanne Guess on 06/29/2022 09:51:35 -------------------------------------------------------------------------------- Progress Note Details Patient Name: Date of Service: Michael [name], Michael LTER C. 06/29/2022 9:15 A M Medical Record Number: [id number] Patient Account Number: [id number] Date of Birth/Sex: Treating RN: 1950-12-02 (72 y.o.  M) Primary Care Provider: Kae Heller Other Clinician: Referring Provider: Treating Provider/Extender: Tina Griffiths in Treatment: 29 Subjective Chief Complaint Information obtained from Patient Patient presents to the wound care center today with an open arterial ulcer to the right lower extremity in the setting of diabetes mellitus. he has had this problem for 7 months 12/04/2021: ulcer to right lower anterior tibial surface History of Present Illness (HPI) The following HPI elements were documented for the patient's wound: Location: right lateral calf closer to the knee Quality: Patient reports experiencing a dull pain to affected area(s). Severity: Patient states wound(s) are getting worse. Duration: Patient has had the wound for > 7 months prior to seeking treatment at the wound center Michael [name], Michael [name] ([id number]) [id number] Page 7 of 12 Timing: Pain in wound is constant (hurts all the time) Context: The wound would happen gradually Modifying Factors: Patient wound(s)/ulcer(s) are worsening due to : no resolution and a white material at the base of the wound Associated Signs and Symptoms: Patient reports having:no discharge or purulent material 72 year old gentleman who has been referred to was from his PCP for a chronic ulceration on his right lower extremity which she's had for several weeks. Past medical history significant for diabetes mellitus type 2, hypertension, hyperlipidemia, anxiety, obesity, peripheral vascular disease and chronic kidney disease. He has never been a smoker. Most recent lab work done at his PCPs office showed a glucose of 217 milligrams per deciliter which is consistent with hyperglycemia. In January 2017 recent hemoglobin A1c values noted to be 8.2%. In April 2017, he has been seen at the vascular office by Dr. Myra Gianotti and [name] for right leg claudication. He had a right superficial femoral artery  stent in September 2012. Recent noninvasive vascular imaging done on 06/24/2015 showed a right ABI of 1.07 with a triphasic waveform and a right TBI of 0.81. The left was noncompressible with a triphasic waveform and a TBI of 1.17. Right lower extremity arterial duplex was unable to identify stent exit but they were elevated velocities at the stent and in the distal thigh with triphasic waveforms. [name] opinion was to return to the clinic in 6 months with ABI and right lower extremity arterial duplex studies to be done. He has seen a dermatologist who had done a biopsy and said it was benign and he was given a steroid cream. 10/23/2015 -- Pathology report of the biopsy  done in April 2017 shows ulcer with underlying angiodermatitis consistent with stasis dermatitis. 10/30/2015 -- he is going for shoulder surgery in about 10 days' time and was asking about the perioperative care. His blood sugars are running in the high 100s or low 200s. No hemoglobin A1c done recently. 11/20/2015 -- is back up to 2 weeks because of recent left shoulder surgery. 12/04/15; patient returns today with the wound for the most part looking healthy. No evidence of infection no debridement required. He is using Prisma for 4 weeks, without obvious improvement per her intake nurse. This started as several small open areas that were raised erythematous. He saw dermatology and at some point this was biopsied that just suggested stasis skin physiology. This certainly doesn't look like that 12/11/15; using Hydrafera Blue. Previous biopsy reviewed, no atypia PAS negative. He has a history of stents in the right leg however he does not appear to have primary arterial insufficiency ABI in this clinic was over 0.9. 12/25/2015 -- he has been approved for grafix and we will order for some to be applied next week 01/01/2016 -- he has had his first application of Grafix today 01/08/2016 -- he has had his second application of Grafix  today READMISSION 12/04/2021 This is a now 72 year old man who was followed in our clinic about 5 years ago for an ulceration on his right lateral lower leg, just distal to the knee. He has since undergone a number of revascularization procedures that have been complicated by restenosis and wound infections. During the course of this treatment, he developed a small ulcer on his right anterior tibial surface. Despite use of an Unna boot, the wound has continued to expand. He was referred to the wound care center by Dr. Myra Gianotti for further evaluation and management. On the right anterior tibial surface, there is an irregular wound with heavy slough and eschar accumulation. The periwound skin is intact but he does have 1-2+ pitting edema. There is no purulent drainage or malodor. 10/13; second visit for this man who has an ischemic wound in the setting of type 2 diabetes on the right anterior mid tibia area. He has been revascularized. Using Santyl gent 12/22/2021: The wound is about the same size this week. There is a lot of gray sloughy material on the surface, secondary to silver nitrate used to stop bleeding after what sounds like a fairly aggressive debridement last week. 12/30/2021: The wound is smaller this week and a bit cleaner. Edema control is excellent. There is still a bit of slough accumulation. 01/07/2022: The wound continues to contract and is much cleaner this week. Edema control is very good. He has small openings in his upper leg surgical scars, but he is going to be seeing Dr. Myra Gianotti next Monday and will have him take a look. He has been applying manuka honey to both of the sites. We have been using Santyl and gentamicin on his lower leg wound. 01/14/2022: He saw vascular surgery this week and was told that they were happy to have [place] manage the 2 wounds from his operation. Both of these have a light layer of slough on the surface. The more distal wound is a little bit dry. The  anterior tibial wound continues to accumulate a fair amount of slough. Edema control is good. 01/19/2022: Both upper leg wounds are smaller. The more distal is quite dry and cracked when I was examining it. The lower leg wound is more painful and the surrounding tissue is erythematous and  indurated. 01/26/2022: The upper leg wounds are healed. The skin at the distal leg wound is quite dry. The lower leg wound is cleaner but still fairly painful. His wrap slid again this week. 02/02/2022: We had good success keeping his wrap intact using a standard Unna boot. The lower leg wound continues to contract and is clean and less painful this week. 12/11; TheraSkin #1 12/18; TheraSkin #2. Wound looks as though it is contracted. Patient states that his pain was better in the leg. Secondary dressing and kerlix Coban 02/24/2022: The wound is smaller since I saw it last. It is less painful and there is good granulation tissue, including over the exposed tendon. 03/09/2022: The wound continues to contract. There is continued fill of the wound cavity with granulation tissue. 03/16/2022: The wound is smaller again today. There is good granulation tissue forming, covering the tendon. 03/30/2022: The wound continues to contract and fill with good granulation tissue. The granulation tissue is oozing a little bit at the lateral edge. 04/13/2022: We left his TheraSkin on an extra week because it was looking so good. The wound is smaller this week and flush with the surrounding skin. His medial thigh wound has opened up again. There is slough and eschar present. 04/20/2022: The anterior tibial wound continues to contract. There is good granulation tissue with just a light layer of slough. The medial thigh ulcer is superficial with slough present. It is a bit fibrotic due to being in a scar. 04/27/2022: The anterior tibial wound continues to contract. There is still some slough buildup with good granulation tissue. The medial  thigh ulcer is very leathery and dry. 05/04/2022: The anterior tibial wound is even smaller today. There is no visible tendon at all. He has some slough and eschar accumulation. The medial thigh ulcer continues to be quite dry with a leathery surface. Michael [name], Michael [name] ([id number]) [id number] Page 8 of 12 05/11/2022: The anterior tibial wound continues to contract. There is a little bit of eschar and slough buildup. The granulation tissue is also a bit hypertrophic. The medial thigh ulcer is dry and leathery, once again. 05/25/2022: The anterior tibial wound is quite a bit smaller this week. There is a little bit of eschar and slough. No reaccumulation of hypertrophic granulation tissue. The medial thigh ulcer has slightly improved tissue quality. It is still somewhat fibrotic, but there is at least a light pink color to the tissue. 06/01/2022: Both wounds are smaller. There is hypertrophic granulation tissue on the anterior tibial wound. The quality of the tissue continues to improve on the medial thigh ulcer. 06/08/2022: The anterior tibial wound got a little bit macerated around the edges and the wound measures a bit larger; there is still some hypertrophic granulation tissue present. The medial thigh ulcer is measuring smaller and the quality of tissue continues to improve. There is some slough and eschar present. 06/15/2022: The anterior tibial wound looks substantially better. The periwound skin has improved and the hypertrophic granulation tissue has resolved. There is some slough and eschar on the surface. The medial thigh ulcer is about the same size, but with continued improvement in the tissue quality. Slough and eschar is also present here. 06/22/2022: The anterior tibial wound is a little bit fibrotic today, but smaller. The medial thigh wound is unchanged in size and also remains somewhat fibrotic as well. 06/29/2022: Both wounds are smaller today. There is a little  bit of eschar and slough on both. In particular, the moisture balance of the  thigh wound has improved significantly. Patient History Information obtained from Patient. Family History Cancer - Siblings, Heart Disease - Father, Hypertension - Father,Mother, No family history of Hereditary Spherocytosis, Kidney Disease, Seizures, Stroke, Thyroid Problems, Tuberculosis. Social History Former smoker - smokeless tobacco, Marital Status - Married, Alcohol Use - Rarely - WINE, Drug Use - No History, Caffeine Use - Daily - COFFEE. Medical History Ear/Nose/Mouth/Throat Patient has history of Chronic sinus problems/congestion - seasonal allergies Cardiovascular Patient has history of Hypertension, Peripheral Arterial Disease - STENTS, Peripheral Venous Disease Endocrine Patient has history of Type II Diabetes - last A1c- 8.2 Hospitalization/Surgery History - left shoulder surgery. Medical A Surgical History Notes nd Constitutional Symptoms (General Health) obesity , h/o (R) leg claudication (right fem stent September 2012) Cardiovascular hyperlipidemia Endocrine pt. on diet intentionally losing 20 pounds since December 2016 in an effort to improve A1C levels Integumentary (Skin) psoriatic arthritis Musculoskeletal bilat knee pain identified as an ortho issue psoriatic arthritis Oncologic Prostate cancer 2018 Psychiatric anxiety Objective Constitutional Slightly hypertensive. no acute distress. Vitals Time Taken: 9:18 AM, Height: 72 in, Weight: 208 lbs, BMI: 28.2, Temperature: 97.9 F, Pulse: 94 bpm, Respiratory Rate: 16 breaths/min, Blood Pressure: 149/76 mmHg. Respiratory Normal work of breathing on room air. General Notes: 06/29/2022: Both wounds are smaller today. There is a little bit of eschar and slough on both. In particular, the moisture balance of the thigh wound has improved significantly. Integumentary (Hair, Skin) Wound #3 status is Open. Original cause of wound was  Blister. The date acquired was: 09/30/2021. The wound has been in treatment 29 weeks. The wound is located on the Right,Anterior Lower Leg. The wound measures 1.4cm length x 0.7cm width x 0.2cm depth; 0.77cm^2 area and 0.154cm^3 volume. There is Fat Layer (Subcutaneous Tissue) exposed. There is no tunneling or undermining noted. There is a medium amount of serosanguineous drainage noted. The wound margin is distinct with the outline attached to the wound base. There is large (67-100%) red granulation within the wound bed. There is a small (1-33%) amount of necrotic tissue within the wound bed including Adherent Slough. The periwound skin appearance did not exhibit: Callus, Crepitus, Excoriation, Induration, Rash, Michael [name], Michael [name] ([id number]) [phone].pdf Page 9 of 12 Scarring, Dry/Scaly, Maceration, Atrophie Blanche, Cyanosis, Ecchymosis, Hemosiderin Staining, Mottled, Pallor, Rubor, Erythema. Periwound temperature was noted as No Abnormality. Wound #4 status is Open. Original cause of wound was Gradually Appeared. The date acquired was: 01/05/2022. The wound has been in treatment 24 weeks. The wound is located on the Right,Medial Upper Leg. The wound measures 1.6cm length x 1cm width x 0.1cm depth; 1.257cm^2 area and 0.126cm^3 volume. There is Fat Layer (Subcutaneous Tissue) exposed. There is no tunneling or undermining noted. There is a medium amount of serosanguineous drainage noted. The wound margin is distinct with the outline attached to the wound base. There is large (67-100%) pink granulation within the wound bed. There is a small (1-33%) amount of necrotic tissue within the wound bed including Eschar and Adherent Slough. The periwound skin appearance had no abnormalities noted for moisture. The periwound skin appearance had no abnormalities noted for color. The periwound skin appearance exhibited: Scarring. Periwound temperature was noted as No  Abnormality. 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 There was a Selective/Open Wound Non-Viable Tissue Debridement with a total area of 0.77 sq cm performed by Duanne Guess, MD. With the following instrument(s): Curette to remove Non-Viable tissue/material. Material removed includes Eschar and Slough and after achieving pain control using Lidocaine 5% topical ointment. No specimens were taken. A time out was conducted at 09:40, prior to the start of the procedure. A Minimum amount of bleeding was controlled with Pressure. The procedure was tolerated well with a pain level of 0 throughout and a pain level of 0 following the procedure. Post Debridement Measurements: 1.4cm length x 0.7cm width x 0.2cm depth; 0.154cm^3 volume. Character of Wound/Ulcer Post Debridement is improved. Severity of Tissue Post Debridement is: Fat layer exposed. Post procedure Diagnosis Wound #3: Same as Pre-Procedure General Notes: Scribed for Dr Lady Gary by Redmond Pulling, RN. Wound #4 Pre-procedure diagnosis of Wound #4 is a Dehisced Wound located on the Right,Medial Upper Leg . There was a Selective/Open Wound Non-Viable Tissue Debridement with a total area of 2.51 sq cm performed by Duanne Guess, MD. With the following instrument(s): Curette to remove Non-Viable tissue/material. Material removed includes Eschar and Slough and after achieving pain control using Lidocaine 5%  topical ointment. No specimens were taken. A time out was conducted at 09:40, prior to the start of the procedure. A Minimum amount of bleeding was controlled with Pressure. The procedure was tolerated well with a pain level of 0 throughout and a pain level of 0 following the procedure. Post Debridement Measurements: 1.6cm length x 1cm width x 0.1cm depth; 0.126cm^3 volume. Character of Wound/Ulcer Post Debridement is improved. Post procedure Diagnosis Wound #4: Same as Pre-Procedure General Notes: Scribed for Dr Lady Gary by Redmond Pulling, RN. Plan Follow-up Appointments: Return Appointment in 1 week. - Dr. Lady Gary - Room 3 Anesthetic: Wound #3 Right,Anterior Lower Leg: (In clinic) Topical Lidocaine 4% applied to wound bed Bathing/ Shower/ Hygiene: May shower with protection but do not get wound dressing(s) wet. Protect dressing(s) with water repellant cover (for example, large plastic bag) or a cast cover and may then take shower. Edema Control - Lymphedema / SCD / Other: Elevate legs to the level of the heart or above for 30 minutes daily and/or when sitting for 3-4 times a day throughout the day. Avoid standing for long periods of time. The following medication(s) was prescribed: lidocaine topical 5 % ointment ointment topical once daily was prescribed at facility WOUND #3: - Lower Leg Wound Laterality: Right, Anterior Cleanser: Soap and Water 1 x Per Week/30 Days Discharge Instructions: May shower and wash wound with dial antibacterial soap and water prior to dressing change. Cleanser: Wound Cleanser 1 x Per Week/30 Days Discharge Instructions: Cleanse the wound with wound cleanser prior to applying a clean dressing using gauze sponges, not tissue or cotton balls. Peri-Wound Care: Zinc Oxide Ointment 30g tube 1 x Per Week/30 Days Discharge Instructions: Apply Zinc Oxide to periwound with each dressing change Peri-Wound Care: Sween Lotion (Moisturizing lotion) 1 x Per Week/30  Days Discharge Instructions: Apply moisturizing lotion as directed Prim Dressing: Endoform 2x2 in 1 x Per Week/30 Days ary Discharge Instructions: Moisten with saline Secondary Dressing: ABD Pad, 8x10 1 x Per Week/30 Days Discharge Instructions: Apply over primary dressing as directed. Secondary Dressing: Woven Gauze Sponge, Non-Sterile 4x4 in 1 x Per Week/30  Days Discharge Instructions: Apply over primary dressing as directed. Michael [name], Michael [name] ([id number]) [id number] Page 10 of 12 Secured With: [employer] Size 5, 10 (yds) 1 x Per Week/30 Days Com pression Wrap: Kerlix Roll 4.5x3.1 (in/yd) 1 x Per Week/30 Days Discharge Instructions: Apply Kerlix and Coban compression as directed. Com pression Wrap: Coban Self-Adherent Wrap 4x5 (in/yd) 1 x Per Week/30 Days Discharge Instructions: Apply over Kerlix as directed. Com pression Wrap: Unnaboot w/Calamine, 4x10 (in/yd) 1 x Per Week/30 Days Discharge Instructions: Apply Unnaboot to top of dressing and around ankles to prevent dressing from sliding WOUND #4: - Upper Leg Wound Laterality: Right, Medial Cleanser: Soap and Water 1 x Per Week/30 Days Discharge Instructions: May shower and wash wound with dial antibacterial soap and water prior to dressing change. Cleanser: Wound Cleanser 1 x Per Week/30 Days Discharge Instructions: Cleanse the wound with wound cleanser prior to applying a clean dressing using gauze sponges, not tissue or cotton balls. Prim Dressing: Endoform 2x2 in 1 x Per Week/30 Days ary Discharge Instructions: Moisten with hydrogel Secondary Dressing: Zetuvit Plus Silicone Border Dressing 4x4 (in/in) 1 x Per Week/30 Days Discharge Instructions: Apply silicone border over primary dressing as directed. 06/29/2022: Both wounds are smaller today. There is a little bit of eschar and slough on both. In particular, the moisture balance of the thigh wound has improved significantly. I used a curette to debride  slough and eschar from both of his wounds. We will continue endoform and Kerlix and Coban wrap. He will follow-up in 1 week. Electronic Signature(s) Signed: 06/29/2022 9:54:39 AM By: Duanne Guess MD FACS Entered By: Duanne Guess on 06/29/2022 09:54:38 -------------------------------------------------------------------------------- HxROS Details Patient Name: Date of Service: Michael [name], Michael LTER C. 06/29/2022 9:15 A M Medical Record Number: [id number] Patient Account Number: [id number] Date of Birth/Sex: Treating RN: 02-Jun-1950 (72 y.o. M) Primary Care Provider: Kae Heller Other Clinician: Referring Provider: Treating Provider/Extender: Tina Griffiths in Treatment: 29 Information Obtained From Patient Constitutional Symptoms (General Health) Medical History: Past Medical History Notes: obesity , h/o (R) leg claudication (right fem stent September 2012) Ear/Nose/Mouth/Throat Medical History: Positive for: Chronic sinus problems/congestion - seasonal allergies Cardiovascular Medical History: Positive for: Hypertension; Peripheral Arterial Disease - STENTS; Peripheral Venous Disease Past Medical History Notes: hyperlipidemia Endocrine Medical History: Positive for: Type II Diabetes - last A1c- 8.2 Past Medical History Notes: pt. on diet intentionally losing 20 pounds since December 2016 in an effort to improve A1C levels Time with diabetes: 9 YRS Treated with: Insulin Blood sugar tested every day: Yes Tested : 7-8 TIMES Integumentary (Skin) Medical History: Past Medical History Notes: psoriatic arthritis Michael [name], Michael [name] ([id number]) [phone].pdf Page 11 of 12 Musculoskeletal Medical History: Past Medical History Notes: bilat knee pain identified as an ortho issue psoriatic arthritis Oncologic Medical History: Past Medical History Notes: Prostate cancer 2018 Psychiatric Medical History: Past Medical History  Notes: anxiety HBO Extended History Items Ear/Nose/Mouth/Throat: Chronic sinus problems/congestion Immunizations Pneumococcal Vaccine: Received Pneumococcal Vaccination: Yes Received Pneumococcal Vaccination On or After 60th Birthday: Yes Implantable Devices None Hospitalization / Surgery History Type of Hospitalization/Surgery left shoulder surgery Family and Social History Cancer: Yes - Siblings; Heart Disease: Yes - Father; Hereditary Spherocytosis: No; Hypertension: Yes - Father,Mother; Kidney Disease: No; Seizures: No; Stroke: No; Thyroid Problems: No; Tuberculosis: No; Former smoker - smokeless tobacco; Marital Status - Married; Alcohol Use: Rarely - WINE; Drug Use: No History; Caffeine Use: Daily - COFFEE; Financial Concerns: No; Food, Clothing or Shelter Needs: No; Support System  Lacking: No; Transportation Concerns: No Electronic Signature(s) Signed: 06/29/2022 4:00:45 PM By: Duanne Guess MD FACS Entered By: Duanne Guess on 06/29/2022 09:52:32 -------------------------------------------------------------------------------- SuperBill Details Patient Name: Date of Service: Michael [name], Michael LTER C. 06/29/2022 Medical Record Number: [id number] Patient Account Number: [id number] Date of Birth/Sex: Treating RN: 1950/08/19 (72 y.o. 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 I70.239 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 E11.622 Type 2 diabetes mellitus with other skin ulcer I73.9 Peripheral vascular disease, unspecified L97.122 Non-pressure chronic ulcer of left thigh with fat layer exposed Facility Procedures : Michael [name], HOCHSTATTER Code Description:  [id number] - DEBRIDE WOUND 1ST 20 SQ CM OR < ICD-10 Diagnosis Description EINER MEALS ([id number]) [phone] L97.815 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 wi L97.122  Non-pressure chronic ulcer of left thigh with fat layer exposed Modifier: [id number]. 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 without evidence I70.239 Atherosclerosis of native  arteries of right leg with ulceration of unspecified site L97.122 Non-pressure chronic ulcer of left thigh with fat layer exposed E11.622 Type 2 diabetes mellitus with other skin ulcer Quantity: 1 of necrosis : [id number] - WC PHYS DEBR WO ANESTH 20 SQ CM ICD-10 Diagnosis Description L97.815 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 without evidence L97.122 Non-pressure chronic ulcer of left thigh with fat layer  exposed Quantity: 1 of necrosis Electronic Signature(s) Signed: 06/29/2022 9:55:08 AM By: Duanne Guess MD FACS Entered By: Duanne Guess on 06/29/2022 09:55:08

## 2022-07-01 DIAGNOSIS — I739 Peripheral vascular disease, unspecified: Secondary | ICD-10-CM | POA: Diagnosis not present

## 2022-07-01 DIAGNOSIS — Z86010 Personal history of colonic polyps: Secondary | ICD-10-CM | POA: Diagnosis not present

## 2022-07-01 DIAGNOSIS — R1032 Left lower quadrant pain: Secondary | ICD-10-CM | POA: Diagnosis not present

## 2022-07-01 DIAGNOSIS — M5440 Lumbago with sciatica, unspecified side: Secondary | ICD-10-CM | POA: Diagnosis not present

## 2022-07-06 ENCOUNTER — Encounter (HOSPITAL_BASED_OUTPATIENT_CLINIC_OR_DEPARTMENT_OTHER): Payer: Medicare Other | Attending: General Surgery | Admitting: General Surgery

## 2022-07-06 DIAGNOSIS — L97815 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: Secondary | ICD-10-CM | POA: Insufficient documentation

## 2022-07-06 DIAGNOSIS — M5440 Lumbago with sciatica, unspecified side: Secondary | ICD-10-CM | POA: Diagnosis not present

## 2022-07-06 DIAGNOSIS — E1151 Type 2 diabetes mellitus with diabetic peripheral angiopathy without gangrene: Secondary | ICD-10-CM | POA: Diagnosis not present

## 2022-07-06 DIAGNOSIS — E11622 Type 2 diabetes mellitus with other skin ulcer: Secondary | ICD-10-CM | POA: Insufficient documentation

## 2022-07-06 DIAGNOSIS — E1122 Type 2 diabetes mellitus with diabetic chronic kidney disease: Secondary | ICD-10-CM | POA: Diagnosis not present

## 2022-07-06 DIAGNOSIS — I129 Hypertensive chronic kidney disease with stage 1 through stage 4 chronic kidney disease, or unspecified chronic kidney disease: Secondary | ICD-10-CM | POA: Insufficient documentation

## 2022-07-06 DIAGNOSIS — F419 Anxiety disorder, unspecified: Secondary | ICD-10-CM | POA: Diagnosis not present

## 2022-07-06 DIAGNOSIS — N189 Chronic kidney disease, unspecified: Secondary | ICD-10-CM | POA: Diagnosis not present

## 2022-07-06 DIAGNOSIS — L97812 Non-pressure chronic ulcer of other part of right lower leg with fat layer exposed: Secondary | ICD-10-CM | POA: Diagnosis not present

## 2022-07-06 DIAGNOSIS — L97122 Non-pressure chronic ulcer of left thigh with fat layer exposed: Secondary | ICD-10-CM | POA: Insufficient documentation

## 2022-07-06 DIAGNOSIS — E785 Hyperlipidemia, unspecified: Secondary | ICD-10-CM | POA: Insufficient documentation

## 2022-07-06 DIAGNOSIS — Z6828 Body mass index (BMI) 28.0-28.9, adult: Secondary | ICD-10-CM | POA: Diagnosis not present

## 2022-07-06 DIAGNOSIS — I70239 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: Secondary | ICD-10-CM | POA: Diagnosis not present

## 2022-07-06 DIAGNOSIS — E669 Obesity, unspecified: Secondary | ICD-10-CM | POA: Insufficient documentation

## 2022-07-06 NOTE — Progress Notes (Signed)
[name], TRUELOCK ([id number]) [id number] Page 1 of 8 Visit Report for 07/06/2022 Arrival Information Details Patient Name: Date of Service: HO FFMA [name], [name] 07/06/2022 9:15 A M Medical Record Number: [id number] Patient Account Number: [id number] Date of Birth/Sex: Treating RN: November 22, 1950 (72 y.o. Michael [name] Primary Care Dyrell Tuccillo: Kae Heller Other Clinician: Referring Joedy Eickhoff: Treating Caprice Mccaffrey/Extender: Tina Griffiths in Treatment: 30 Visit Information History Since Last Visit Added or deleted any medications: No Patient Arrived: Ambulatory Any new allergies or adverse reactions: No Arrival Time: 09:18 Had a fall or experienced change in No Accompanied By: wife activities of daily living that may affect Transfer Assistance: None risk of falls: Patient Identification Verified: Yes Signs or symptoms of abuse/neglect since last visito No Secondary Verification Process Completed: Yes Hospitalized since last visit: No Patient Requires Transmission-Based Precautions: No Implantable device outside of the clinic excluding No Patient Has Alerts: Yes cellular tissue based products placed in the center Patient Alerts: Patient on Blood Thinner since last visit: plavix Has Dressing in Place as Prescribed: Yes Has Compression in Place as Prescribed: Yes Pain Present Now: Yes Electronic Signature(s) Signed: 07/06/2022 4:00:29 PM By: Samuella Bruin Entered By: Samuella Bruin on 07/06/2022 09:20:28 -------------------------------------------------------------------------------- Encounter Discharge Information Details Patient Name: Date of Service: [name] 07/06/2022 9:15 A M Medical Record Number: [id number] Patient Account Number: [id number] Date of Birth/Sex: Treating RN: 01-Nov-1950 (72 y.o. Michael [name] Primary Care Markita Stcharles: Kae Heller Other Clinician: Referring Tascha Casares: Treating  Zianna Dercole/Extender: Tina Griffiths in Treatment: 30 Encounter Discharge Information Items Post Procedure Vitals Discharge Condition: Stable Temperature (F): 97.6 Ambulatory Status: Ambulatory Pulse (bpm): 80 Discharge Destination: Home Respiratory Rate (breaths/min): 16 Transportation: Private Auto Blood Pressure (mmHg): 116/65 Accompanied By: wife Schedule Follow-up Appointment: Yes Clinical Summary of Care: Patient Declined Electronic Signature(s) Signed: 07/06/2022 4:00:29 PM By: Samuella Bruin Entered By: Samuella Bruin on 07/06/2022 09:55:41 -------------------------------------------------------------------------------- Lower Extremity Assessment Details Patient Name: Date of Service: [name], [name] 07/06/2022 9:15 A M Medical Record Number: [id number] Patient Account Number: [id number] Date of Birth/Sex: Treating RN: 23-Apr-1950 (72 y.o. Michael [name] Primary Care Garrett Bowring: Kae Heller Other Clinician: Referring Yulissa Needham: Treating Delaila Nand/Extender: Tina Griffiths in Treatment: 30 Edema Assessment H[Left: [name] ([id number])] Franne Forts: [id number] Page 2 of 8] Assessed: [Left: No] [Right: No] Edema: [Left: N] [Right: o] Calf Left: Right: Point of Measurement: 35 cm From Medial Instep 37.2 cm Ankle Left: Right: Point of Measurement: 12 cm From Medial Instep 22.8 cm Vascular Assessment Pulses: Dorsalis Pedis Palpable: [Right:Yes] Electronic Signature(s) Signed: 07/06/2022 4:00:29 PM By: Samuella Bruin Entered By: Samuella Bruin on 07/06/2022 09:26:07 -------------------------------------------------------------------------------- Multi Wound Chart Details Patient Name: Date of Service: [name] 07/06/2022 9:15 A M Medical Record Number: [id number] Patient Account Number: [id number] Date of Birth/Sex: Treating RN: 1950-10-30 (72 y.o. M) Primary Care  Michael [name]: Kae Heller Other Clinician: Referring Corrion Stirewalt: Treating Maryum Batterson/Extender: Tina Griffiths in Treatment: 30 Vital Signs Height(in): 72 Capillary Blood Glucose(mg/dl): 016 Weight(lbs): 010 Pulse(bpm): 102 Body Mass Index(BMI): 28.2 Blood Pressure(mmHg): 116/65 Temperature(F): 97.7 Respiratory Rate(breaths/min): 16 [3:Photos:] [N/A:N/A] Right, Anterior Lower Leg Right, Medial Upper Leg N/A Wound Location: Blister Gradually Appeared N/A Wounding Event: Diabetic Wound/Ulcer of the Lower Dehisced Wound N/A Primary Etiology: Extremity Chronic sinus problems/congestion, Chronic sinus problems/congestion, N/A Comorbid History: Hypertension, Peripheral Arterial Hypertension, Peripheral Arterial Disease, Peripheral Venous Disease, Disease, Peripheral Venous Disease, Type II Diabetes Type II  Diabetes 09/30/2021 01/05/2022 N/A Date Acquired: 30 25 N/A Weeks of Treatment: Open Open N/A Wound Status: No No N/A Wound Recurrence: 1.1x0.3x0.2 1.5x0.7x0.1 N/A Measurements L x W x D (cm) 0.259 0.825 N/A A (cm) : rea 0.052 0.082 N/A Volume (cm) : 98.50% N/A N/A % Reduction in Area: 99.00% N/A N/A % Reduction in Volume: Grade 2 Full Thickness Without Exposed N/A Classification: Support Structures Medium Medium N/A Exudate Amount: Serosanguineous Serosanguineous N/A Exudate Type: red, brown red, brown N/A Exudate Color: Distinct, outline attached Distinct, outline attached N/A Wound Margin: Medium (34-66%) Large (67-100%) N/A Granulation Amount: JONTAY, CORNWELL ([id number]) [id number] Page 3 of 8 Red Pink N/A Granulation Quality: Medium (34-66%) Small (1-33%) N/A Necrotic Amount: Adherent Slough Eschar, Adherent Slough N/A Necrotic Tissue: Fat Layer (Subcutaneous Tissue): Yes Fat Layer (Subcutaneous Tissue): Yes N/A Exposed Structures: Fascia: No Fascia: No Tendon: No Tendon: No Muscle: No Muscle:  No Joint: No Joint: No Bone: No Bone: No Medium (34-66%) Small (1-33%) N/A Epithelialization: Debridement - Selective/Open Wound Debridement - Selective/Open Wound N/A Debridement: Pre-procedure Verification/Time Out 09:35 09:35 N/A Taken: Lidocaine 4% Topical Solution Lidocaine 4% Topical Solution N/A Pain Control: Ambulance person, Slough N/A Tissue Debrided: Non-Viable Tissue Non-Viable Tissue N/A Level: 0.26 0.82 N/A Debridement A (sq cm): rea Curette Curette N/A Instrument: Minimum Minimum N/A Bleeding: Pressure Pressure N/A Hemostasis A chieved: Procedure was tolerated well Procedure was tolerated well N/A Debridement Treatment Response: 1.1x0.3x0.2 1.5x0.7x0.1 N/A Post Debridement Measurements L x W x D (cm) 0.052 0.082 N/A Post Debridement Volume: (cm) Scarring: Yes Scarring: Yes N/A Periwound Skin Texture: Excoriation: No Induration: No Callus: No Crepitus: No Rash: No Maceration: No No Abnormalities Noted N/A Periwound Skin Moisture: Dry/Scaly: No Atrophie Blanche: No No Abnormalities Noted N/A Periwound Skin Color: Cyanosis: No Ecchymosis: No Erythema: No Hemosiderin Staining: No Mottled: No Pallor: No Rubor: No No Abnormality No Abnormality N/A Temperature: Debridement Debridement N/A Procedures Performed: Treatment Notes Electronic Signature(s) Signed: 07/06/2022 9:53:15 AM By: Duanne Guess MD FACS Entered By: Duanne Guess on 07/06/2022 09:53:15 -------------------------------------------------------------------------------- Multi-Disciplinary Care Plan Details Patient Name: Date of Service: [name] 07/06/2022 9:15 A M Medical Record Number: [id number] Patient Account Number: [id number] Date of Birth/Sex: Treating RN: 1950/12/26 (72 y.o. Michael [name] Primary Care Rane Dumm: Kae Heller Other Clinician: Referring Rodolph Hagemann: Treating Leianne Callins/Extender: Tina Griffiths in  Treatment: 30 Active Inactive Nutrition Nursing Diagnoses: Potential for alteratiion in Nutrition/Potential for imbalanced nutrition Goals: Patient/caregiver agrees to and verbalizes understanding of need to use nutritional supplements and/or vitamins as prescribed Date Initiated: 12/04/2021 Target Resolution Date: 10/29/2022 Goal Status: Active Patient/caregiver verbalizes understanding of need to maintain therapeutic glucose control per primary care physician Date Initiated: 12/04/2021 Target Resolution Date: 10/29/2022 Goal Status: Active Interventions: Assess patient nutrition upon admission and as needed per policy VYTAUTAS, BARBIERI ([id number]) [id number] Page 4 of 8 Provide education on elevated blood sugars and impact on wound healing Provide education on nutrition Treatment Activities: Education provided on Nutrition : 01/26/2022 Notes: Wound/Skin Impairment Nursing Diagnoses: Impaired tissue integrity Knowledge deficit related to ulceration/compromised skin integrity Goals: Patient/caregiver will verbalize understanding of skin care regimen Date Initiated: 12/04/2021 Date Inactivated: 02/02/2022 Target Resolution Date: 01/28/2022 Goal Status: Met Ulcer/skin breakdown will have a volume reduction of 30% by week 4 Date Initiated: 12/04/2021 Date Inactivated: 01/07/2022 Target Resolution Date: 12/26/2021 Goal Status: Met Ulcer/skin breakdown will have a volume reduction of 50% by week 8 Date Initiated: 01/07/2022 Date Inactivated: 03/16/2022 Target Resolution Date: 04/01/2022 Goal  Status: Met Ulcer/skin breakdown will have a volume reduction of 80% by week 12 Date Initiated: 03/16/2022 Target Resolution Date: 10/29/2022 Goal Status: Active Interventions: Assess ulceration(s) every visit Provide education on ulcer and skin care Notes: Electronic Signature(s) Signed: 07/06/2022 4:00:29 PM By: Samuella Bruin Entered By: Samuella Bruin on  07/06/2022 09:38:35 -------------------------------------------------------------------------------- Pain Assessment Details Patient Name: Date of Service: [name], [place] C. 07/06/2022 9:15 A M Medical Record Number: [id number] Patient Account Number: [id number] Date of Birth/Sex: Treating RN: 17-Jan-1951 (72 y.o. Michael [name] Primary Care Abbe Bula: Kae Heller Other Clinician: Referring Kalya Troeger: Treating Celina Shiley/Extender: Tina Griffiths in Treatment: 30 Active Problems Location of Pain Severity and Description of Pain Patient Has Paino Yes Site Locations Pain Location: Pain in Ulcers Duration of the Pain. Constant / Intermittento Intermittent Rate the pain. Current Pain Level: 1 Character of Pain Describe the Pain: DOMINIQ, NEALY ([id number]) [id number] Page 5 of 8 Pain Management and Medication Current Pain Management: Medication: Yes Electronic Signature(s) Signed: 07/06/2022 4:00:29 PM By: Samuella Bruin Entered By: Samuella Bruin on 07/06/2022 09:20:54 -------------------------------------------------------------------------------- Patient/Caregiver Education Details Patient Name: Date of Service: [name] 5/6/2024andnbsp9:15 A M Medical Record Number: [id number] Patient Account Number: [id number] Date of Birth/Gender: Treating RN: [birth year] (72 y.o. Michael [name] Primary Care Physician: Kae Heller Other Clinician: Referring Physician: Treating Physician/Extender: Tina Griffiths in Treatment: 30 Education Assessment Education Provided To: Patient Education Topics Provided Safety: Methods: Explain/Verbal Responses: Reinforcements needed, State content correctly Electronic Signature(s) Signed: 07/06/2022 4:00:29 PM By: Samuella Bruin Entered By: Samuella Bruin on 07/06/2022  09:38:52 -------------------------------------------------------------------------------- Wound Assessment Details Patient Name: Date of Service: [name] 07/06/2022 9:15 A M Medical Record Number: [id number] Patient Account Number: [id number] Date of Birth/Sex: Treating RN: March 06, 1950 (72 y.o. Michael [name] Primary Care Roanne Haye: Kae Heller Other Clinician: Referring Osher Oettinger: Treating Lutricia Widjaja/Extender: Tina Griffiths in Treatment: 30 Wound Status Wound Number: 3 Primary Diabetic Wound/Ulcer of the Lower Extremity Etiology: Wound Location: Right, Anterior Lower Leg Wound Open Wounding Event: Blister Status: Date Acquired: 09/30/2021 Comorbid Chronic sinus problems/congestion, Hypertension, Peripheral Weeks Of Treatment: 30 History: Arterial Disease, Peripheral Venous Disease, Type II Diabetes Clustered Wound: No Photos Wound Measurements Mastandrea, Lamichael C ([id number]) Length: (cm) 1.1 Width: (cm) 0.3 Depth: (cm) 0.2 Area: (cm) 0.259 Volume: (cm) 0.052 [id number] Page 6 of 8 % Reduction in Area: 98.5% % Reduction in Volume: 99% Epithelialization: Medium (34-66%) Tunneling: No Undermining: No Wound Description Classification: Grade 2 Wound Margin: Distinct, outline attached Exudate Amount: Medium Exudate Type: Serosanguineous Exudate Color: red, brown Foul Odor After Cleansing: No Slough/Fibrino Yes Wound Bed Granulation Amount: Medium (34-66%) Exposed Structure Granulation Quality: Red Fascia Exposed: No Necrotic Amount: Medium (34-66%) Fat Layer (Subcutaneous Tissue) Exposed: Yes Necrotic Quality: Adherent Slough Tendon Exposed: No Muscle Exposed: No Joint Exposed: No Bone Exposed: No Periwound Skin Texture Texture Color No Abnormalities Noted: No No Abnormalities Noted: No Callus: No Atrophie Blanche: No Crepitus: No Cyanosis: No Excoriation: No Ecchymosis: No Induration:  No Erythema: No Rash: No Hemosiderin Staining: No Scarring: Yes Mottled: No Pallor: No Moisture Rubor: No No Abnormalities Noted: No Dry / Scaly: No Temperature / Pain Maceration: No Temperature: No Abnormality Treatment Notes Wound #3 (Lower Leg) Wound Laterality: Right, Anterior Cleanser Soap and Water Discharge Instruction: May shower and wash wound with dial antibacterial soap and water prior to dressing change. Wound Cleanser Discharge Instruction: Cleanse the wound with wound cleanser prior  to applying a clean dressing using gauze sponges, not tissue or cotton balls. Peri-Wound Care Zinc Oxide Ointment 30g tube Discharge Instruction: Apply Zinc Oxide to periwound with each dressing change Sween Lotion (Moisturizing lotion) Discharge Instruction: Apply moisturizing lotion as directed Topical Primary Dressing Endoform 2x2 in Discharge Instruction: Moisten with saline Secondary Dressing ABD Pad, 8x10 Discharge Instruction: Apply over primary dressing as directed. Woven Gauze Sponge, Non-Sterile 4x4 in Discharge Instruction: Apply over primary dressing as directed. Secured With [employer] Size 5, 10 (yds) Compression Wrap Kerlix Roll 4.5x3.1 (in/yd) Discharge Instruction: Apply Kerlix and Coban compression as directed. Coban Self-Adherent Wrap 4x5 (in/yd) Discharge Instruction: Apply over Kerlix as directed. Unnaboot w/Calamine, 4x10 (in/yd) SHELLEY, BARBIAN ([id number]) [id number] Page 7 of 8 Discharge Instruction: Apply Unnaboot to top of dressing and around ankles to prevent dressing from sliding Compression Stockings Add-Ons Electronic Signature(s) Signed: 07/06/2022 4:00:29 PM By: Samuella Bruin Entered By: Samuella Bruin on 07/06/2022 09:29:46 -------------------------------------------------------------------------------- Wound Assessment Details Patient Name: Date of Service: [name], [name] 07/06/2022 9:15 A  M Medical Record Number: [id number] Patient Account Number: [id number] Date of Birth/Sex: Treating RN: June 16, 1950 (72 y.o. Michael [name] Primary Care Tarrah Furuta: Kae Heller Other Clinician: Referring Amellia Panik: Treating Riva Sesma/Extender: Tina Griffiths in Treatment: 30 Wound Status Wound Number: 4 Primary Dehisced Wound Etiology: Wound Location: Right, Medial Upper Leg Wound Open Wounding Event: Gradually Appeared Status: Date Acquired: 01/05/2022 Comorbid Chronic sinus problems/congestion, Hypertension, Peripheral Weeks Of Treatment: 25 History: Arterial Disease, Peripheral Venous Disease, Type II Diabetes Clustered Wound: No Photos Wound Measurements Length: (cm) 1.5 Width: (cm) 0.7 Depth: (cm) 0.1 Area: (cm) 0.825 Volume: (cm) 0.082 % Reduction in Area: % Reduction in Volume: Epithelialization: Small (1-33%) Tunneling: No Undermining: No Wound Description Classification: Full Thickness Without Exposed Support Structures Wound Margin: Distinct, outline attached Exudate Amount: Medium Exudate Type: Serosanguineous Exudate Color: red, brown Foul Odor After Cleansing: No Slough/Fibrino Yes Wound Bed Granulation Amount: Large (67-100%) Exposed Structure Granulation Quality: Pink Fascia Exposed: No Necrotic Amount: Small (1-33%) Fat Layer (Subcutaneous Tissue) Exposed: Yes Necrotic Quality: Eschar, Adherent Slough Tendon Exposed: No Muscle Exposed: No Joint Exposed: No Bone Exposed: No Periwound Skin Texture Texture Color No Abnormalities Noted: No No Abnormalities Noted: Yes Scarring: Yes Temperature / Pain Temperature: No Abnormality Moisture No Abnormalities NotedJMICHAEL, MINER ([id number]) [id number] Page 8 of 8 Treatment Notes Wound #4 (Upper Leg) Wound Laterality: Right, Medial Cleanser Soap and Water Discharge Instruction: May shower and wash wound with dial antibacterial soap and  water prior to dressing change. Wound Cleanser Discharge Instruction: Cleanse the wound with wound cleanser prior to applying a clean dressing using gauze sponges, not tissue or cotton balls. Peri-Wound Care Topical Primary Dressing Endoform 2x2 in Discharge Instruction: Moisten with hydrogel Secondary Dressing Zetuvit Plus Silicone Border Dressing 4x4 (in/in) Discharge Instruction: Apply silicone border over primary dressing as directed. Secured With Compression Wrap Compression Stockings Facilities manager) Signed: 07/06/2022 4:00:29 PM By: Samuella Bruin Entered By: Samuella Bruin on 07/06/2022 09:30:12 -------------------------------------------------------------------------------- Vitals Details Patient Name: Date of Service: [name] 07/06/2022 9:15 A M Medical Record Number: [id number] Patient Account Number: [id number] Date of Birth/Sex: Treating RN: 12-23-1950 (72 y.o. Michael [name] Primary Care Terica Yogi: Kae Heller Other Clinician: Referring Balian Schaller: Treating Tamas Suen/Extender: Tina Griffiths in Treatment: 30 Vital Signs Time Taken: 09:20 Temperature (F): 97.7 Height (in): 72 Pulse (bpm): 102 Weight (lbs): 208 Respiratory Rate (breaths/min): 16 Body Mass Index (BMI):  28.2 Blood Pressure (mmHg): 116/65 Capillary Blood Glucose (mg/dl): 161 Reference Range: 80 - 120 mg / dl Electronic Signature(s) Signed: 07/06/2022 4:00:29 PM By: Samuella Bruin Entered By: Samuella Bruin on 07/06/2022 09:21:35

## 2022-07-06 NOTE — Progress Notes (Signed)
Michael [name], Michael [name] ([id number]) [id number] Page 1 of 12 Visit Report for 07/06/2022 Chief Complaint Document Details Patient Name: Date of Service: HO FFMA [place], [name] 07/06/2022 9:15 A M Medical Record Number: [id number] Patient Account Number: [id number] Date of Birth/Sex: Treating RN: [birth year] (72 y.o. M) Primary Care Provider: Kae Heller Other Clinician: Referring Provider: Treating Provider/Extender: Tina Griffiths in Treatment: 30 Information Obtained from: Patient Chief Complaint Patient presents to the wound care center today with an open arterial ulcer to the right lower extremity in the setting of diabetes mellitus. he has had this problem for 7 months 12/04/2021: ulcer to right lower anterior tibial surface Electronic Signature(s) Signed: 07/06/2022 9:53:25 AM By: Duanne Guess MD FACS Entered By: Duanne Guess on 07/06/2022 09:53:25 -------------------------------------------------------------------------------- Debridement Details Patient Name: Date of Service: [name] 07/06/2022 9:15 A M Medical Record Number: [id number] Patient Account Number: [id number] Date of Birth/Sex: Treating RN: 09/29/1950 (72 y.o. Michael [name] Primary Care Provider: Kae Heller Other Clinician: Referring Provider: Treating Provider/Extender: Tina Griffiths in Treatment: 30 Debridement Performed for Assessment: Wound #3 Right,Anterior Lower Leg Performed By: Physician Duanne Guess, MD Debridement Type: Debridement Severity of Tissue Pre Debridement: Fat layer exposed Level of Consciousness (Pre-procedure): Awake and Alert Pre-procedure Verification/Time Out Yes - 09:35 Taken: Start Time: 09:35 Pain Control: Lidocaine 4% T opical Solution Percent of Wound Bed Debrided: 100% T Area Debrided (cm): otal 0.26 Tissue and other material debrided: Non-Viable, Slough, Slough Level:  Non-Viable Tissue Debridement Description: Selective/Open Wound Instrument: Curette Bleeding: Minimum Hemostasis Achieved: Pressure Response to Treatment: Procedure was tolerated well Level of Consciousness (Post- Awake and Alert procedure): Post Debridement Measurements of Total Wound Length: (cm) 1.1 Width: (cm) 0.3 Depth: (cm) 0.2 Volume: (cm) 0.052 Character of Wound/Ulcer Post Debridement: Improved Severity of Tissue Post Debridement: Fat layer exposed Post Procedure Diagnosis Same as Pre-procedure Notes scribed for Dr. Lady [name] by Michael Bruin, RN Electronic Signature(s) [name] ([id number]) [phone].pdf Page 2 of 12 Signed: 07/06/2022 10:02:57 AM By: Duanne Guess MD FACS Signed: 07/06/2022 4:00:29 PM By: Gelene Mink By: Michael [name] on 07/06/2022 09:37:24 -------------------------------------------------------------------------------- Debridement Details Patient Name: Date of Service: [name], [name] 07/06/2022 9:15 A M Medical Record Number: [id number] Patient Account Number: [id number] Date of Birth/Sex: Treating RN: [birth year] (72 y.o. Michael [name] Primary Care Provider: Kae Heller Other Clinician: Referring Provider: Treating Provider/Extender: Tina Griffiths in Treatment: 30 Debridement Performed for Assessment: Wound #4 Right,Medial Upper Leg Performed By: Physician Duanne Guess, MD Debridement Type: Debridement Level of Consciousness (Pre-procedure): Awake and Alert Pre-procedure Verification/Time Out Yes - 09:35 Taken: Start Time: 09:35 Pain Control: Lidocaine 4% Topical Solution Percent of Wound Bed Debrided: 100% T Area Debrided (cm): otal 0.82 Tissue and other material debrided: Non-Viable, Eschar, Slough, Slough Level: Non-Viable Tissue Debridement Description: Selective/Open Wound Instrument: Curette Bleeding: Minimum Hemostasis Achieved:  Pressure Response to Treatment: Procedure was tolerated well Level of Consciousness (Post- Awake and Alert procedure): Post Debridement Measurements of Total Wound Length: (cm) 1.5 Width: (cm) 0.7 Depth: (cm) 0.1 Volume: (cm) 0.082 Character of Wound/Ulcer Post Debridement: Improved Post Procedure Diagnosis Same as Pre-procedure Notes scribed for Dr. Lady [name] by Michael Bruin, RN Electronic Signature(s) Signed: 07/06/2022 10:02:57 AM By: Duanne Guess MD FACS Signed: 07/06/2022 4:00:29 PM By: Michael [name] Entered By: Michael [name] on 07/06/2022 09:38:30 -------------------------------------------------------------------------------- HPI Details Patient Name: Date of Service: [name], [name] 07/06/2022 9:15 A M Medical  Record Number: [id number] Patient Account Number: [id number] Date of Birth/Sex: Treating RN: 04-16-1950 (72 y.o. M) Primary Care Provider: Kae Heller Other Clinician: Referring Provider: Treating Provider/Extender: Tina Griffiths in Treatment: 30 History of Present Illness Location: right lateral calf closer to the knee Quality: Patient reports experiencing a dull pain to affected area(s). Severity: Patient states wound(s) are getting worse. Duration: Patient has had the wound for > 7 months prior to seeking treatment at the wound center Timing: Pain in wound is constant (hurts all the time) Context: The wound would happen gradually Modifying Factors: Patient wound(s)/ulcer(s) are worsening due to : no resolution and a white material at the base of the wound ssociated Signs and Symptoms: Patient reports having:no discharge or purulent material A Michael [name], Michael [name] ([id number]) [id number] Page 3 of 12 HPI Description: 72 year old gentleman who has been referred to was from his PCP for a chronic ulceration on his right lower extremity which she's had for several weeks. Past medical history  significant for diabetes mellitus type 2, hypertension, hyperlipidemia, anxiety, obesity, peripheral vascular disease and chronic kidney disease. He has never been a smoker. Most recent lab work done at his PCPs office showed a glucose of 217 milligrams per deciliter which is consistent with hyperglycemia. In January 2017 recent hemoglobin A1c values noted to be 8.2%. In April 2017, he has been seen at the vascular office by Dr. Myra Gianotti and [name] for right leg claudication. He had a right superficial femoral artery stent in September 2012. Recent noninvasive vascular imaging done on 06/24/2015 showed a right ABI of 1.07 with a triphasic waveform and a right TBI of 0.81. The left was noncompressible with a triphasic waveform and a TBI of 1.17. Right lower extremity arterial duplex was unable to identify stent exit but they were elevated velocities at the stent and in the distal thigh with triphasic waveforms. [name] opinion was to return to the clinic in 6 months with ABI and right lower extremity arterial duplex studies to be done. He has seen a dermatologist who had done a biopsy and said it was benign and he was given a steroid cream. 10/23/2015 -- Pathology report of the biopsy done in April 2017 shows ulcer with underlying angiodermatitis consistent with stasis dermatitis. 10/30/2015 -- he is going for shoulder surgery in about 10 days' time and was asking about the perioperative care. His blood sugars are running in the high 100s or low 200s. No hemoglobin A1c done recently. 11/20/2015 -- is back up to 2 weeks because of recent left shoulder surgery. 12/04/15; patient returns today with the wound for the most part looking healthy. No evidence of infection no debridement required. He is using Prisma for 4 weeks, without obvious improvement per her intake nurse. This started as several small open areas that were raised erythematous. He saw dermatology and at some point this was biopsied  that just suggested stasis skin physiology. This certainly doesn't look like that 12/11/15; using Hydrafera Blue. Previous biopsy reviewed, no atypia PAS negative. He has a history of stents in the right leg however he does not appear to have primary arterial insufficiency ABI in this clinic was over 0.9. 12/25/2015 -- he has been approved for grafix and we will order for some to be applied next week 01/01/2016 -- he has had his first application of Grafix today 01/08/2016 -- he has had his second application of Grafix today READMISSION 12/04/2021 This is a now 72 year old man who was followed in our  clinic about 5 years ago for an ulceration on his right lateral lower leg, just distal to the knee. He has since undergone a number of revascularization procedures that have been complicated by restenosis and wound infections. During the course of this treatment, he developed a small ulcer on his right anterior tibial surface. Despite use of an Unna boot, the wound has continued to expand. He was referred to the wound care center by Dr. Myra Gianotti for further evaluation and management. On the right anterior tibial surface, there is an irregular wound with heavy slough and eschar accumulation. The periwound skin is intact but he does have 1-2+ pitting edema. There is no purulent drainage or malodor. 10/13; second visit for this man who has an ischemic wound in the setting of type 2 diabetes on the right anterior mid tibia area. He has been revascularized. Using Santyl gent 12/22/2021: The wound is about the same size this week. There is a lot of gray sloughy material on the surface, secondary to silver nitrate used to stop bleeding after what sounds like a fairly aggressive debridement last week. 12/30/2021: The wound is smaller this week and a bit cleaner. Edema control is excellent. There is still a bit of slough accumulation. 01/07/2022: The wound continues to contract and is much cleaner this week.  Edema control is very good. He has small openings in his upper leg surgical scars, but he is going to be seeing Dr. Myra Gianotti next Monday and will have him take a look. He has been applying manuka honey to both of the sites. We have been using Santyl and gentamicin on his lower leg wound. 01/14/2022: He saw vascular surgery this week and was told that they were happy to have [place] manage the 2 wounds from his operation. Both of these have a light layer of slough on the surface. The more distal wound is a little bit dry. The anterior tibial wound continues to accumulate a fair amount of slough. Edema control is good. 01/19/2022: Both upper leg wounds are smaller. The more distal is quite dry and cracked when I was examining it. The lower leg wound is more painful and the surrounding tissue is erythematous and indurated. 01/26/2022: The upper leg wounds are healed. The skin at the distal leg wound is quite dry. The lower leg wound is cleaner but still fairly painful. His wrap slid again this week. 02/02/2022: We had good success keeping his wrap intact using a standard Unna boot. The lower leg wound continues to contract and is clean and less painful this week. 12/11; TheraSkin #1 12/18; TheraSkin #2. Wound looks as though it is contracted. Patient states that his pain was better in the leg. Secondary dressing and kerlix Coban 02/24/2022: The wound is smaller since I saw it last. It is less painful and there is good granulation tissue, including over the exposed tendon. 03/09/2022: The wound continues to contract. There is continued fill of the wound cavity with granulation tissue. 03/16/2022: The wound is smaller again today. There is good granulation tissue forming, covering the tendon. 03/30/2022: The wound continues to contract and fill with good granulation tissue. The granulation tissue is oozing a little bit at the lateral edge. 04/13/2022: We left his TheraSkin on an extra week because it was looking so  good. The wound is smaller this week and flush with the surrounding skin. His medial thigh wound has opened up again. There is slough and eschar present. 04/20/2022: The anterior tibial wound continues to contract. There  is good granulation tissue with just a light layer of slough. The medial thigh ulcer is superficial with slough present. It is a bit fibrotic due to being in a scar. 04/27/2022: The anterior tibial wound continues to contract. There is still some slough buildup with good granulation tissue. The medial thigh ulcer is very leathery and dry. 05/04/2022: The anterior tibial wound is even smaller today. There is no visible tendon at all. He has some slough and eschar accumulation. The medial thigh ulcer continues to be quite dry with a leathery surface. 05/11/2022: The anterior tibial wound continues to contract. There is a little bit of eschar and slough buildup. The granulation tissue is also a bit hypertrophic. The medial thigh ulcer is dry and leathery, once again. 05/25/2022: The anterior tibial wound is quite a bit smaller this week. There is a little bit of eschar and slough. No reaccumulation of hypertrophic granulation GIANNI, HOUT ([id number]) [id number] Page 4 of 12 tissue. The medial thigh ulcer has slightly improved tissue quality. It is still somewhat fibrotic, but there is at least a light pink color to the tissue. 06/01/2022: Both wounds are smaller. There is hypertrophic granulation tissue on the anterior tibial wound. The quality of the tissue continues to improve on the medial thigh ulcer. 06/08/2022: The anterior tibial wound got a little bit macerated around the edges and the wound measures a bit larger; there is still some hypertrophic granulation tissue present. The medial thigh ulcer is measuring smaller and the quality of tissue continues to improve. There is some slough and eschar present. 06/15/2022: The anterior tibial wound looks  substantially better. The periwound skin has improved and the hypertrophic granulation tissue has resolved. There is some slough and eschar on the surface. The medial thigh ulcer is about the same size, but with continued improvement in the tissue quality. Slough and eschar is also present here. 06/22/2022: The anterior tibial wound is a little bit fibrotic today, but smaller. The medial thigh wound is unchanged in size and also remains somewhat fibrotic as well. 06/29/2022: Both wounds are smaller today. There is a little bit of eschar and slough on both. In particular, the moisture balance of the thigh wound has improved significantly. 07/06/2022: The anterior tibial ulcer is nearly closed with just 2 small open areas remaining with slough accumulation. The medial thigh ulcer is smaller by about 25% and the moisture balance continues to improve. There is some slough and eschar buildup. Electronic Signature(s) Signed: 07/06/2022 9:54:03 AM By: Duanne Guess MD FACS Entered By: Duanne Guess on 07/06/2022 09:54:03 -------------------------------------------------------------------------------- Physical Exam Details Patient Name: Date of Service: [name], [name] 07/06/2022 9:15 A M Medical Record Number: [id number] Patient Account Number: [id number] Date of Birth/Sex: Treating RN: Oct 16, 1950 (72 y.o. M) Primary Care Provider: Kae Heller Other Clinician: Referring Provider: Treating Provider/Extender: Tina Griffiths in Treatment: 30 Constitutional . Slightly tachycardic. . . no acute distress. Respiratory Normal work of breathing on room air. Notes 07/06/2022: The anterior tibial ulcer is nearly closed with just 2 small open areas remaining with slough accumulation. The medial thigh ulcer is smaller by about 25% and the moisture balance continues to improve. There is some slough and eschar buildup. Electronic Signature(s) Signed: 07/06/2022 9:54:29 AM By:  Duanne Guess MD FACS Entered By: Duanne Guess on 07/06/2022 09:54:29 -------------------------------------------------------------------------------- Physician Orders Details Patient Name: Date of Service: [name] 07/06/2022 9:15 A M Medical Record Number: [id number] Patient Account Number: [id number]  Date of Birth/Sex: Treating RN: 1950/04/14 (72 y.o. Michael [name] Primary Care Provider: Kae Heller Other Clinician: Referring Provider: Treating Provider/Extender: Tina Griffiths in Treatment: 30 Verbal / Phone Orders: No Diagnosis Coding ICD-10 Coding Code Description [id number] 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 I70.239 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 E11.622 Type 2 diabetes mellitus with other skin ulcer I73.9 Peripheral vascular disease, unspecified L97.122 Non-pressure chronic ulcer of left thigh with fat layer exposed Follow-up Appointments OKIE, JEFFORDS ([id number]) [id number] Page 5 of 12 ppointment in 1 week. - Dr. Lady [name] - Room 3 Return A Anesthetic Wound #3 Right,Anterior Lower Leg (In clinic) Topical Lidocaine 4% applied to wound bed Bathing/ Shower/ Hygiene May shower with protection but do not get wound dressing(s) wet. Protect dressing(s) with water repellant cover (for example, large plastic bag) or a cast cover and may then take shower. Edema Control - Lymphedema / SCD / Other Right Lower Extremity Elevate legs to the level of the heart or above for 30 minutes daily and/or when sitting for 3-4 times a day throughout the day. Avoid standing for long periods of time. Wound Treatment Wound #3 - Lower Leg Wound Laterality: Right, Anterior Cleanser: Soap and Water 1 x Per Week/30 Days Discharge Instructions: May shower and wash wound with dial antibacterial  soap and water prior to dressing change. Cleanser: Wound Cleanser 1 x Per Week/30 Days Discharge Instructions: Cleanse the wound with wound cleanser prior to applying a clean dressing using gauze sponges, not tissue or cotton balls. Peri-Wound Care: Zinc Oxide Ointment 30g tube 1 x Per Week/30 Days Discharge Instructions: Apply Zinc Oxide to periwound with each dressing change Peri-Wound Care: Sween Lotion (Moisturizing lotion) 1 x Per Week/30 Days Discharge Instructions: Apply moisturizing lotion as directed Prim Dressing: Endoform 2x2 in 1 x Per Week/30 Days ary Discharge Instructions: Moisten with saline Secondary Dressing: ABD Pad, 8x10 1 x Per Week/30 Days Discharge Instructions: Apply over primary dressing as directed. Secondary Dressing: Woven Gauze Sponge, Non-Sterile 4x4 in 1 x Per Week/30 Days Discharge Instructions: Apply over primary dressing as directed. Secured With: [employer] Size 5, 10 (yds) 1 x Per Week/30 Days Compression Wrap: Kerlix Roll 4.5x3.1 (in/yd) 1 x Per Week/30 Days Discharge Instructions: Apply Kerlix and Coban compression as directed. Compression Wrap: Coban Self-Adherent Wrap 4x5 (in/yd) 1 x Per Week/30 Days Discharge Instructions: Apply over Kerlix as directed. Compression Wrap: Unnaboot w/Calamine, 4x10 (in/yd) 1 x Per Week/30 Days Discharge Instructions: Apply Unnaboot to top of dressing and around ankles to prevent dressing from sliding Wound #4 - Upper Leg Wound Laterality: Right, Medial Cleanser: Soap and Water 1 x Per Week/30 Days Discharge Instructions: May shower and wash wound with dial antibacterial soap and water prior to dressing change. Cleanser: Wound Cleanser 1 x Per Week/30 Days Discharge Instructions: Cleanse the wound with wound cleanser prior to applying a clean dressing using gauze sponges, not tissue or cotton balls. Prim Dressing: Endoform 2x2 in 1 x Per Week/30 Days ary Discharge Instructions: Moisten with hydrogel Secondary  Dressing: Zetuvit Plus Silicone Border Dressing 4x4 (in/in) 1 x Per Week/30 Days Discharge Instructions: Apply silicone border over primary dressing as directed. Patient Medications llergies: Actos, Demerol, glimepiride, nabumetone, oxycodone HCl, penicillin A Notifications Medication Indication Start End 07/06/2022 lidocaine DOSE topical 4 % cream - cream topical Electronic Signature(s) Signed: 07/06/2022 10:02:57 AM By: Duanne Guess MD FACS Entered By:  Duanne Guess on 07/06/2022 09:54:41 Fiorini, Britt Boozer ([id number]) [id number] Page 6 of 12 -------------------------------------------------------------------------------- Problem List Details Patient Name: Date of Service: HO [place], [name] 07/06/2022 9:15 A M Medical Record Number: [id number] Patient Account Number: [id number] Date of Birth/Sex: Treating RN: Jul 27, 1950 (72 y.o. M) Primary Care Provider: Kae Heller Other Clinician: Referring Provider: Treating Provider/Extender: Tina Griffiths in Treatment: 30 Active Problems ICD-10 Encounter Code Description Active Date MDM Diagnosis L97.815 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 12/04/2021 No Yes involvement without evidence of necrosis I70.239 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 12/04/2021 No Yes E11.622 Type 2 diabetes mellitus with other skin ulcer 12/04/2021 No Yes I73.9 Peripheral vascular disease, unspecified 12/04/2021 No Yes L97.122 Non-pressure chronic ulcer of left thigh with fat layer exposed 01/14/2022 No Yes Inactive Problems Resolved Problems Electronic Signature(s) Signed: 07/06/2022 9:53:10 AM By: Duanne Guess MD FACS Entered By: Duanne Guess on 07/06/2022 09:53:09 -------------------------------------------------------------------------------- Progress Note Details Patient Name: Date of Service: [name]. 07/06/2022 9:15 A M Medical Record Number: [id number] Patient Account Number: [id number] Date of Birth/Sex: Treating RN: 06-30-1950 (72 y.o. M) Primary Care Provider: Kae Heller Other Clinician: Referring Provider: Treating Provider/Extender: Tina Griffiths in Treatment: 30 Subjective Chief Complaint Information obtained from Patient Patient presents to the wound care center today with an open arterial ulcer to the right lower extremity in the setting of diabetes mellitus. he has had this problem for 7 months 12/04/2021: ulcer to right lower anterior tibial surface History of Present Illness (HPI) The following HPI elements were documented for the patient's wound: Location: right lateral calf closer to the knee Quality: Patient reports experiencing a dull pain to affected area(s). Severity: Patient states wound(s) are getting worse. Duration: Patient has had the wound for > 7 months prior to seeking treatment at the wound center Timing: Pain in wound is constant (hurts all the time) Context: The wound would happen gradually SHADD, LEGNON ([id number]) [id number] Page 7 of 12 Modifying Factors: Patient wound(s)/ulcer(s) are worsening due to : no resolution and a white material at the base of the wound Associated Signs and Symptoms: Patient reports having:no discharge or purulent material 72 year old gentleman who has been referred to was from his PCP for a chronic ulceration on his right lower extremity which she's had for several weeks. Past medical history significant for diabetes mellitus type 2, hypertension, hyperlipidemia, anxiety, obesity, peripheral vascular disease and chronic kidney disease. He has never been a smoker. Most recent lab work done at his PCPs office showed a glucose of 217 milligrams per deciliter which is consistent with hyperglycemia. In January 2017 recent hemoglobin A1c values noted to be 8.2%. In April  2017, he has been seen at the vascular office by Dr. Myra Gianotti and [name] for right leg claudication. He had a right superficial femoral artery stent in September 2012. Recent noninvasive vascular imaging done on 06/24/2015 showed a right ABI of 1.07 with a triphasic waveform and a right TBI of 0.81. The left was noncompressible with a triphasic waveform and a TBI of 1.17. Right lower extremity arterial duplex was unable to identify stent exit but they were elevated velocities at the stent and in the distal thigh with triphasic waveforms. [name] opinion was to return to the clinic in 6 months with ABI and right lower extremity arterial duplex studies to be done. He has seen a dermatologist  who had done a biopsy and said it was benign and he was given a steroid cream. 10/23/2015 -- Pathology report of the biopsy done in April 2017 shows ulcer with underlying angiodermatitis consistent with stasis dermatitis. 10/30/2015 -- he is going for shoulder surgery in about 10 days' time and was asking about the perioperative care. His blood sugars are running in the high 100s or low 200s. No hemoglobin A1c done recently. 11/20/2015 -- is back up to 2 weeks because of recent left shoulder surgery. 12/04/15; patient returns today with the wound for the most part looking healthy. No evidence of infection no debridement required. He is using Prisma for 4 weeks, without obvious improvement per her intake nurse. This started as several small open areas that were raised erythematous. He saw dermatology and at some point this was biopsied that just suggested stasis skin physiology. This certainly doesn't look like that 12/11/15; using Hydrafera Blue. Previous biopsy reviewed, no atypia PAS negative. He has a history of stents in the right leg however he does not appear to have primary arterial insufficiency ABI in this clinic was over 0.9. 12/25/2015 -- he has been approved for grafix and we will order for some  to be applied next week 01/01/2016 -- he has had his first application of Grafix today 01/08/2016 -- he has had his second application of Grafix today READMISSION 12/04/2021 This is a now 72 year old man who was followed in our clinic about 5 years ago for an ulceration on his right lateral lower leg, just distal to the knee. He has since undergone a number of revascularization procedures that have been complicated by restenosis and wound infections. During the course of this treatment, he developed a small ulcer on his right anterior tibial surface. Despite use of an Unna boot, the wound has continued to expand. He was referred to the wound care center by Dr. Myra Gianotti for further evaluation and management. On the right anterior tibial surface, there is an irregular wound with heavy slough and eschar accumulation. The periwound skin is intact but he does have 1-2+ pitting edema. There is no purulent drainage or malodor. 10/13; second visit for this man who has an ischemic wound in the setting of type 2 diabetes on the right anterior mid tibia area. He has been revascularized. Using Santyl gent 12/22/2021: The wound is about the same size this week. There is a lot of gray sloughy material on the surface, secondary to silver nitrate used to stop bleeding after what sounds like a fairly aggressive debridement last week. 12/30/2021: The wound is smaller this week and a bit cleaner. Edema control is excellent. There is still a bit of slough accumulation. 01/07/2022: The wound continues to contract and is much cleaner this week. Edema control is very good. He has small openings in his upper leg surgical scars, but he is going to be seeing Dr. Myra Gianotti next Monday and will have him take a look. He has been applying manuka honey to both of the sites. We have been using Santyl and gentamicin on his lower leg wound. 01/14/2022: He saw vascular surgery this week and was told that they were happy to have [place]  manage the 2 wounds from his operation. Both of these have a light layer of slough on the surface. The more distal wound is a little bit dry. The anterior tibial wound continues to accumulate a fair amount of slough. Edema control is good. 01/19/2022: Both upper leg wounds are smaller. The more distal  is quite dry and cracked when I was examining it. The lower leg wound is more painful and the surrounding tissue is erythematous and indurated. 01/26/2022: The upper leg wounds are healed. The skin at the distal leg wound is quite dry. The lower leg wound is cleaner but still fairly painful. His wrap slid again this week. 02/02/2022: We had good success keeping his wrap intact using a standard Unna boot. The lower leg wound continues to contract and is clean and less painful this week. 12/11; TheraSkin #1 12/18; TheraSkin #2. Wound looks as though it is contracted. Patient states that his pain was better in the leg. Secondary dressing and kerlix Coban 02/24/2022: The wound is smaller since I saw it last. It is less painful and there is good granulation tissue, including over the exposed tendon. 03/09/2022: The wound continues to contract. There is continued fill of the wound cavity with granulation tissue. 03/16/2022: The wound is smaller again today. There is good granulation tissue forming, covering the tendon. 03/30/2022: The wound continues to contract and fill with good granulation tissue. The granulation tissue is oozing a little bit at the lateral edge. 04/13/2022: We left his TheraSkin on an extra week because it was looking so good. The wound is smaller this week and flush with the surrounding skin. His medial thigh wound has opened up again. There is slough and eschar present. 04/20/2022: The anterior tibial wound continues to contract. There is good granulation tissue with just a light layer of slough. The medial thigh ulcer is superficial with slough present. It is a bit fibrotic due to being in  a scar. 04/27/2022: The anterior tibial wound continues to contract. There is still some slough buildup with good granulation tissue. The medial thigh ulcer is very leathery and dry. 05/04/2022: The anterior tibial wound is even smaller today. There is no visible tendon at all. He has some slough and eschar accumulation. The medial thigh ulcer continues to be quite dry with a leathery surface. 05/11/2022: The anterior tibial wound continues to contract. There is a little bit of eschar and slough buildup. The granulation tissue is also a bit hypertrophic. The medial thigh ulcer is dry and leathery, once again. Michael [name], Michael [name] ([id number]) [id number] Page 8 of 12 05/25/2022: The anterior tibial wound is quite a bit smaller this week. There is a little bit of eschar and slough. No reaccumulation of hypertrophic granulation tissue. The medial thigh ulcer has slightly improved tissue quality. It is still somewhat fibrotic, but there is at least a light pink color to the tissue. 06/01/2022: Both wounds are smaller. There is hypertrophic granulation tissue on the anterior tibial wound. The quality of the tissue continues to improve on the medial thigh ulcer. 06/08/2022: The anterior tibial wound got a little bit macerated around the edges and the wound measures a bit larger; there is still some hypertrophic granulation tissue present. The medial thigh ulcer is measuring smaller and the quality of tissue continues to improve. There is some slough and eschar present. 06/15/2022: The anterior tibial wound looks substantially better. The periwound skin has improved and the hypertrophic granulation tissue has resolved. There is some slough and eschar on the surface. The medial thigh ulcer is about the same size, but with continued improvement in the tissue quality. Slough and eschar is also present here. 06/22/2022: The anterior tibial wound is a little bit fibrotic today, but smaller. The  medial thigh wound is unchanged in size and also remains somewhat fibrotic as well.  06/29/2022: Both wounds are smaller today. There is a little bit of eschar and slough on both. In particular, the moisture balance of the thigh wound has improved significantly. 07/06/2022: The anterior tibial ulcer is nearly closed with just 2 small open areas remaining with slough accumulation. The medial thigh ulcer is smaller by about 25% and the moisture balance continues to improve. There is some slough and eschar buildup. Patient History Information obtained from Patient. Family History Cancer - Siblings, Heart Disease - Father, Hypertension - Father,Mother, No family history of Hereditary Spherocytosis, Kidney Disease, Seizures, Stroke, Thyroid Problems, Tuberculosis. Social History Former smoker - smokeless tobacco, Marital Status - Married, Alcohol Use - Rarely - WINE, Drug Use - No History, Caffeine Use - Daily - COFFEE. Medical History Ear/Nose/Mouth/Throat Patient has history of Chronic sinus problems/congestion - seasonal allergies Cardiovascular Patient has history of Hypertension, Peripheral Arterial Disease - STENTS, Peripheral Venous Disease Endocrine Patient has history of Type II Diabetes - last A1c- 8.2 Hospitalization/Surgery History - left shoulder surgery. Medical A Surgical History Notes nd Constitutional Symptoms (General Health) obesity , h/o (R) leg claudication (right fem stent September 2012) Cardiovascular hyperlipidemia Endocrine pt. on diet intentionally losing 20 pounds since December 2016 in an effort to improve A1C levels Integumentary (Skin) psoriatic arthritis Musculoskeletal bilat knee pain identified as an ortho issue psoriatic arthritis Oncologic Prostate cancer 2018 Psychiatric anxiety Objective Constitutional Slightly tachycardic. no acute distress. Vitals Time Taken: 9:20 AM, Height: 72 in, Weight: 208 lbs, BMI: 28.2, Temperature: 97.7 F, Pulse: 102  bpm, Respiratory Rate: 16 breaths/min, Blood Pressure: 116/65 mmHg, Capillary Blood Glucose: 109 mg/dl. Respiratory Normal work of breathing on room air. General Notes: 07/06/2022: The anterior tibial ulcer is nearly closed with just 2 small open areas remaining with slough accumulation. The medial thigh ulcer is smaller by about 25% and the moisture balance continues to improve. There is some slough and eschar buildup. Integumentary (Hair, Skin) Wound #3 status is Open. Original cause of wound was Blister. The date acquired was: 09/30/2021. The wound has been in treatment 30 weeks. The wound is located on the Right,Anterior Lower Leg. The wound measures 1.1cm length x 0.3cm width x 0.2cm depth; 0.259cm^2 area and 0.052cm^3 volume. There is Fat Layer (Subcutaneous Tissue) exposed. There is no tunneling or undermining noted. There is a medium amount of serosanguineous drainage noted. The wound margin is distinct with the outline attached to the wound base. There is medium (34-66%) red granulation within the wound bed. There is a medium (34-66%) AKILES, DIDONNA ([id number]) [id number] Page 9 of 12 amount of necrotic tissue within the wound bed including Adherent Slough. The periwound skin appearance exhibited: Scarring. The periwound skin appearance did not exhibit: Callus, Crepitus, Excoriation, Induration, Rash, Dry/Scaly, Maceration, Atrophie Blanche, Cyanosis, Ecchymosis, Hemosiderin Staining, Mottled, Pallor, Rubor, Erythema. Periwound temperature was noted as No Abnormality. Wound #4 status is Open. Original cause of wound was Gradually Appeared. The date acquired was: 01/05/2022. The wound has been in treatment 25 weeks. The wound is located on the Right,Medial Upper Leg. The wound measures 1.5cm length x 0.7cm width x 0.1cm depth; 0.825cm^2 area and 0.082cm^3 volume. There is Fat Layer (Subcutaneous Tissue) exposed. There is no tunneling or undermining noted. There is  a medium amount of serosanguineous drainage noted. The wound margin is distinct with the outline attached to the wound base. There is large (67-100%) pink granulation within the wound bed. There is a small (1- 33%) amount of necrotic tissue within the wound bed including Eschar  and Adherent Slough. The periwound skin appearance had no abnormalities noted for moisture. The periwound skin appearance had no abnormalities noted for color. The periwound skin appearance exhibited: Scarring. Periwound temperature was noted as No Abnormality. 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 There was a Selective/Open Wound Non-Viable Tissue Debridement with a total area of 0.26 sq cm performed by Duanne Guess, MD. With the following instrument(s): Curette to remove Non-Viable tissue/material. Material removed includes [facility] after achieving pain control using Lidocaine 4% T opical Solution. No specimens were taken. A time out was conducted at 09:35, prior to the start of the procedure. A Minimum amount of bleeding was controlled with Pressure. The procedure was tolerated well. Post Debridement Measurements: 1.1cm length x 0.3cm width x 0.2cm depth; 0.052cm^3 volume. Character of Wound/Ulcer Post Debridement is improved. Severity of Tissue Post Debridement is: Fat layer exposed. Post procedure Diagnosis Wound #3: Same as Pre-Procedure General Notes: scribed for Dr.  Lady [name] by Michael Bruin, RN. Wound #4 Pre-procedure diagnosis of Wound #4 is a Dehisced Wound located on the Right,Medial Upper Leg . There was a Selective/Open Wound Non-Viable Tissue Debridement with a total area of 0.82 sq cm performed by Duanne Guess, MD. With the following instrument(s): Curette to remove Non-Viable tissue/material. Material removed includes Eschar and Slough and after achieving pain control using Lidocaine 4% Topical Solution. No specimens were taken. A time out was conducted at 09:35, prior to the start of the procedure. A Minimum amount of bleeding was controlled with Pressure. The procedure was tolerated well. Post Debridement Measurements: 1.5cm length x 0.7cm width x 0.1cm depth; 0.082cm^3 volume. Character of Wound/Ulcer Post Debridement is improved. Post procedure Diagnosis Wound #4: Same as Pre-Procedure General Notes: scribed for Dr. Lady [name] by Michael Bruin, RN. Plan Follow-up Appointments: Return Appointment in 1 week. - Dr. Lady [name] - Room 3 Anesthetic: Wound #3 Right,Anterior Lower Leg: (In clinic) Topical Lidocaine 4% applied to wound bed Bathing/ Shower/ Hygiene: May shower with protection but do not get wound dressing(s) wet. Protect dressing(s) with water repellant cover (for example, large plastic bag) or a cast cover and may then take shower. Edema Control - Lymphedema / SCD / Other: Elevate legs to the level of the heart or above for 30 minutes daily and/or when sitting for 3-4 times a day throughout the day. Avoid standing for long periods of time. The following medication(s) was prescribed: lidocaine topical 4 % cream cream topical was prescribed at facility WOUND #3: - Lower Leg Wound Laterality: Right, Anterior Cleanser: Soap and Water 1 x Per Week/30 Days Discharge Instructions: May shower and wash wound with dial antibacterial soap and water prior to dressing change. Cleanser: Wound Cleanser 1 x Per Week/30 Days Discharge  Instructions: Cleanse the wound with wound cleanser prior to applying a clean dressing using gauze sponges, not tissue or cotton balls. Peri-Wound Care: Zinc Oxide Ointment 30g tube 1 x Per Week/30 Days Discharge Instructions: Apply Zinc Oxide to periwound with each dressing change Peri-Wound Care: Sween Lotion (Moisturizing lotion) 1 x Per Week/30 Days Discharge  Instructions: Apply moisturizing lotion as directed Prim Dressing: Endoform 2x2 in 1 x Per Week/30 Days ary Discharge Instructions: Moisten with saline Secondary Dressing: ABD Pad, 8x10 1 x Per Week/30 Days Discharge Instructions: Apply over primary dressing as directed. Secondary Dressing: Woven Gauze Sponge, Non-Sterile 4x4 in 1 x Per Week/30 Days Discharge Instructions: Apply over primary dressing as directed. ASUNCION, GUIDER ([id number]) [id number] Page 10 of 12 Secured With: [employer] Size 5, 10 (yds) 1 x Per Week/30 Days Com pression Wrap: Kerlix Roll 4.5x3.1 (in/yd) 1 x Per Week/30 Days Discharge Instructions: Apply Kerlix and Coban compression as directed. Com pression Wrap: Coban Self-Adherent Wrap 4x5 (in/yd) 1 x Per Week/30 Days Discharge Instructions: Apply over Kerlix as directed. Com pression Wrap: Unnaboot w/Calamine, 4x10 (in/yd) 1 x Per Week/30 Days Discharge Instructions: Apply Unnaboot to top of dressing and around ankles to prevent dressing from sliding WOUND #4: - Upper Leg Wound Laterality: Right, Medial Cleanser: Soap and Water 1 x Per Week/30 Days Discharge Instructions: May shower and wash wound with dial antibacterial soap and water prior to dressing change. Cleanser: Wound Cleanser 1 x Per Week/30 Days Discharge Instructions: Cleanse the wound with wound cleanser prior to applying a clean dressing using gauze sponges, not tissue or cotton balls. Prim Dressing: Endoform 2x2 in 1 x Per Week/30 Days ary Discharge Instructions: Moisten with hydrogel Secondary Dressing:  Zetuvit Plus Silicone Border Dressing 4x4 (in/in) 1 x Per Week/30 Days Discharge Instructions: Apply silicone border over primary dressing as directed. 07/06/2022: The anterior tibial ulcer is nearly closed with just 2 small open areas remaining with slough accumulation. The medial thigh ulcer is smaller by about 25% and the moisture balance continues to improve. There is some slough and eschar buildup. I used a curette to debride slough from the anterior tibial wounds and slough and eschar from the medial thigh wound. We will continue endoform to both sites with liberal hydrogel application to the medial thigh wound. Kerlix and Coban compression wrap to the lower leg. Follow-up in 1 week. Electronic Signature(s) Signed: 07/06/2022 9:56:07 AM By: Duanne Guess MD FACS Entered By: Duanne Guess on 07/06/2022 09:56:07 -------------------------------------------------------------------------------- HxROS Details Patient Name: Date of Service: [name], [name] 07/06/2022 9:15 A M Medical Record Number: [id number] Patient Account Number: [id number] Date of Birth/Sex: Treating RN: October 12, 1950 (72 y.o. M) Primary Care Provider: Kae Heller Other Clinician: Referring Provider: Treating Provider/Extender: Tina Griffiths in Treatment: 30 Information Obtained From Patient Constitutional Symptoms (General Health) Medical History: Past Medical History Notes: obesity , h/o (R) leg claudication (right fem stent September 2012) Ear/Nose/Mouth/Throat Medical History: Positive for: Chronic sinus problems/congestion - seasonal allergies Cardiovascular Medical History: Positive for: Hypertension; Peripheral Arterial Disease - STENTS; Peripheral Venous Disease Past Medical History Notes: hyperlipidemia Endocrine Medical History: Positive for: Type II Diabetes - last A1c- 8.2 Past Medical History Notes: pt. on diet intentionally losing 20 pounds since December 2016 in an  effort to improve A1C levels Time with diabetes: 9 YRS Treated with: Insulin Blood sugar tested every day: Yes Tested : 7-8 TIMES Integumentary (Skin) Medical History: Past Medical History Notes: psoriatic arthritis Michael [name], Michael [name] ([id number]) [phone].pdf Page 11 of 12 Musculoskeletal Medical History: Past Medical History Notes: bilat knee pain identified as an ortho issue psoriatic arthritis Oncologic Medical History: Past Medical History Notes: Prostate cancer 2018 Psychiatric Medical History: Past Medical History Notes: anxiety HBO Extended History Items Ear/Nose/Mouth/Throat: Chronic sinus problems/congestion Immunizations Pneumococcal Vaccine: Received Pneumococcal Vaccination: Yes Received Pneumococcal Vaccination  On or After 60th Birthday: Yes Implantable Devices None Hospitalization / Surgery History Type of Hospitalization/Surgery left shoulder surgery Family and Social History Cancer: Yes - Siblings; Heart Disease: Yes - Father; Hereditary Spherocytosis: No; Hypertension: Yes - Father,Mother; Kidney Disease: No; Seizures: No; Stroke: No; Thyroid Problems: No; Tuberculosis: No; Former smoker - smokeless tobacco; Marital Status - Married; Alcohol Use: Rarely - WINE; Drug Use: No History; Caffeine Use: Daily - COFFEE; Financial Concerns: No; Food, Clothing or Shelter Needs: No; Support System Lacking: No; Transportation Concerns: No Electronic Signature(s) Signed: 07/06/2022 10:02:57 AM By: Duanne Guess MD FACS Entered By: Duanne Guess on 07/06/2022 09:54:08 -------------------------------------------------------------------------------- SuperBill Details Patient Name: Date of Service: [name], [name] 07/06/2022 Medical Record Number: [id number] Patient Account Number: [id number] Date of Birth/Sex: Treating RN: [birth year] (72 y.o. 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 I70.239 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 E11.622 Type 2 diabetes mellitus with other skin ulcer I73.9 Peripheral vascular disease, unspecified L97.122 Non-pressure chronic ulcer of left thigh with fat layer exposed Facility Procedures : BRAVERY, GABLE Code Description: [id number] - DEBRIDE WOUND 1ST 20 SQ CM OR < Kham C ([id number]) [phone] ICD-10 Diagnosis Description L97.815 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 wi L97.122  Non-pressure chronic ulcer of left thigh with fat layer exposed Modifier: [id number]. thout evidence o Quantity: 1 pdf Page 12 of 12 f necrosis Physician Procedures : CPT4 Code Description Modifier [phone] [id number] - WC PHYS LEVEL 3 - EST PT 25 ICD-10 Diagnosis Description L97.815 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 without evidence L97.122 Non-pressure chronic ulcer of  left thigh with fat layer exposed I73.9 Peripheral vascular disease, unspecified E11.622 Type 2 diabetes mellitus with other skin ulcer Quantity: 1 of necrosis : [id number] - WC PHYS DEBR WO ANESTH 20 SQ CM ICD-10 Diagnosis Description L97.815 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 without evidence L97.122 Non-pressure chronic ulcer of left thigh with fat layer  exposed Quantity: 1 of necrosis Electronic Signature(s) Signed: 07/06/2022 9:56:26 AM By: Duanne Guess MD FACS Entered By: Duanne Guess on 07/06/2022 09:56:25

## 2022-07-13 ENCOUNTER — Encounter (HOSPITAL_BASED_OUTPATIENT_CLINIC_OR_DEPARTMENT_OTHER): Payer: Medicare Other | Admitting: General Surgery

## 2022-07-13 DIAGNOSIS — F419 Anxiety disorder, unspecified: Secondary | ICD-10-CM | POA: Diagnosis not present

## 2022-07-13 DIAGNOSIS — E669 Obesity, unspecified: Secondary | ICD-10-CM | POA: Diagnosis not present

## 2022-07-13 DIAGNOSIS — E785 Hyperlipidemia, unspecified: Secondary | ICD-10-CM | POA: Diagnosis not present

## 2022-07-13 DIAGNOSIS — L97812 Non-pressure chronic ulcer of other part of right lower leg with fat layer exposed: Secondary | ICD-10-CM | POA: Diagnosis not present

## 2022-07-13 DIAGNOSIS — I70239 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: Secondary | ICD-10-CM | POA: Diagnosis not present

## 2022-07-13 DIAGNOSIS — E11622 Type 2 diabetes mellitus with other skin ulcer: Secondary | ICD-10-CM | POA: Diagnosis not present

## 2022-07-13 DIAGNOSIS — I129 Hypertensive chronic kidney disease with stage 1 through stage 4 chronic kidney disease, or unspecified chronic kidney disease: Secondary | ICD-10-CM | POA: Diagnosis not present

## 2022-07-13 DIAGNOSIS — E1151 Type 2 diabetes mellitus with diabetic peripheral angiopathy without gangrene: Secondary | ICD-10-CM | POA: Diagnosis not present

## 2022-07-13 DIAGNOSIS — N189 Chronic kidney disease, unspecified: Secondary | ICD-10-CM | POA: Diagnosis not present

## 2022-07-13 DIAGNOSIS — E1122 Type 2 diabetes mellitus with diabetic chronic kidney disease: Secondary | ICD-10-CM | POA: Diagnosis not present

## 2022-07-13 DIAGNOSIS — L97122 Non-pressure chronic ulcer of left thigh with fat layer exposed: Secondary | ICD-10-CM | POA: Diagnosis not present

## 2022-07-13 DIAGNOSIS — Z6828 Body mass index (BMI) 28.0-28.9, adult: Secondary | ICD-10-CM | POA: Diagnosis not present

## 2022-07-13 DIAGNOSIS — L97815 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: Secondary | ICD-10-CM | POA: Diagnosis not present

## 2022-07-14 NOTE — Progress Notes (Signed)
Fax received from [facility] on 07/03/22 for medical clearance/medication hold for colonoscopy/endoscopy to be signed by V.W. Myra Gianotti, MD.  Provider signed on 07/06/22, form faxed back to sender on 07/08/22, verified successful, sent to scan center.

## 2022-07-20 ENCOUNTER — Encounter (HOSPITAL_BASED_OUTPATIENT_CLINIC_OR_DEPARTMENT_OTHER): Payer: Medicare Other | Admitting: General Surgery

## 2022-07-20 DIAGNOSIS — I70239 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: Secondary | ICD-10-CM | POA: Diagnosis not present

## 2022-07-20 DIAGNOSIS — E1151 Type 2 diabetes mellitus with diabetic peripheral angiopathy without gangrene: Secondary | ICD-10-CM | POA: Diagnosis not present

## 2022-07-20 DIAGNOSIS — N189 Chronic kidney disease, unspecified: Secondary | ICD-10-CM | POA: Diagnosis not present

## 2022-07-20 DIAGNOSIS — E785 Hyperlipidemia, unspecified: Secondary | ICD-10-CM | POA: Diagnosis not present

## 2022-07-20 DIAGNOSIS — L97815 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: Secondary | ICD-10-CM | POA: Diagnosis not present

## 2022-07-20 DIAGNOSIS — T8131XA Disruption of external operation (surgical) wound, not elsewhere classified, initial encounter: Secondary | ICD-10-CM | POA: Diagnosis not present

## 2022-07-20 DIAGNOSIS — L97112 Non-pressure chronic ulcer of right thigh with fat layer exposed: Secondary | ICD-10-CM | POA: Diagnosis not present

## 2022-07-20 DIAGNOSIS — E1122 Type 2 diabetes mellitus with diabetic chronic kidney disease: Secondary | ICD-10-CM | POA: Diagnosis not present

## 2022-07-20 DIAGNOSIS — L97122 Non-pressure chronic ulcer of left thigh with fat layer exposed: Secondary | ICD-10-CM | POA: Diagnosis not present

## 2022-07-20 DIAGNOSIS — F419 Anxiety disorder, unspecified: Secondary | ICD-10-CM | POA: Diagnosis not present

## 2022-07-20 DIAGNOSIS — I129 Hypertensive chronic kidney disease with stage 1 through stage 4 chronic kidney disease, or unspecified chronic kidney disease: Secondary | ICD-10-CM | POA: Diagnosis not present

## 2022-07-20 DIAGNOSIS — E669 Obesity, unspecified: Secondary | ICD-10-CM | POA: Diagnosis not present

## 2022-07-20 DIAGNOSIS — Z6828 Body mass index (BMI) 28.0-28.9, adult: Secondary | ICD-10-CM | POA: Diagnosis not present

## 2022-07-20 DIAGNOSIS — E11622 Type 2 diabetes mellitus with other skin ulcer: Secondary | ICD-10-CM | POA: Diagnosis not present

## 2022-07-20 DIAGNOSIS — L97812 Non-pressure chronic ulcer of other part of right lower leg with fat layer exposed: Secondary | ICD-10-CM | POA: Diagnosis not present

## 2022-07-24 DIAGNOSIS — H00014 Hordeolum externum left upper eyelid: Secondary | ICD-10-CM | POA: Diagnosis not present

## 2022-07-24 DIAGNOSIS — M25551 Pain in right hip: Secondary | ICD-10-CM | POA: Diagnosis not present

## 2022-07-28 ENCOUNTER — Encounter (HOSPITAL_BASED_OUTPATIENT_CLINIC_OR_DEPARTMENT_OTHER): Payer: Medicare Other | Admitting: General Surgery

## 2022-07-28 DIAGNOSIS — F419 Anxiety disorder, unspecified: Secondary | ICD-10-CM | POA: Diagnosis not present

## 2022-07-28 DIAGNOSIS — Z6828 Body mass index (BMI) 28.0-28.9, adult: Secondary | ICD-10-CM | POA: Diagnosis not present

## 2022-07-28 DIAGNOSIS — E1151 Type 2 diabetes mellitus with diabetic peripheral angiopathy without gangrene: Secondary | ICD-10-CM | POA: Diagnosis not present

## 2022-07-28 DIAGNOSIS — N189 Chronic kidney disease, unspecified: Secondary | ICD-10-CM | POA: Diagnosis not present

## 2022-07-28 DIAGNOSIS — E11622 Type 2 diabetes mellitus with other skin ulcer: Secondary | ICD-10-CM | POA: Diagnosis not present

## 2022-07-28 DIAGNOSIS — E785 Hyperlipidemia, unspecified: Secondary | ICD-10-CM | POA: Diagnosis not present

## 2022-07-28 DIAGNOSIS — L97815 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: Secondary | ICD-10-CM | POA: Diagnosis not present

## 2022-07-28 DIAGNOSIS — E1122 Type 2 diabetes mellitus with diabetic chronic kidney disease: Secondary | ICD-10-CM | POA: Diagnosis not present

## 2022-07-28 DIAGNOSIS — L97122 Non-pressure chronic ulcer of left thigh with fat layer exposed: Secondary | ICD-10-CM | POA: Diagnosis not present

## 2022-07-28 DIAGNOSIS — T8131XA Disruption of external operation (surgical) wound, not elsewhere classified, initial encounter: Secondary | ICD-10-CM | POA: Diagnosis not present

## 2022-07-28 DIAGNOSIS — E669 Obesity, unspecified: Secondary | ICD-10-CM | POA: Diagnosis not present

## 2022-07-28 DIAGNOSIS — I129 Hypertensive chronic kidney disease with stage 1 through stage 4 chronic kidney disease, or unspecified chronic kidney disease: Secondary | ICD-10-CM | POA: Diagnosis not present

## 2022-07-28 DIAGNOSIS — I70239 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: Secondary | ICD-10-CM | POA: Diagnosis not present

## 2022-08-03 ENCOUNTER — Encounter (HOSPITAL_BASED_OUTPATIENT_CLINIC_OR_DEPARTMENT_OTHER): Payer: Medicare Other | Attending: General Surgery | Admitting: General Surgery

## 2022-08-03 DIAGNOSIS — E1122 Type 2 diabetes mellitus with diabetic chronic kidney disease: Secondary | ICD-10-CM | POA: Diagnosis not present

## 2022-08-03 DIAGNOSIS — L97122 Non-pressure chronic ulcer of left thigh with fat layer exposed: Secondary | ICD-10-CM | POA: Insufficient documentation

## 2022-08-03 DIAGNOSIS — L97815 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: Secondary | ICD-10-CM | POA: Diagnosis not present

## 2022-08-03 DIAGNOSIS — E785 Hyperlipidemia, unspecified: Secondary | ICD-10-CM | POA: Diagnosis not present

## 2022-08-03 DIAGNOSIS — I890 Lymphedema, not elsewhere classified: Secondary | ICD-10-CM | POA: Insufficient documentation

## 2022-08-03 DIAGNOSIS — N189 Chronic kidney disease, unspecified: Secondary | ICD-10-CM | POA: Diagnosis not present

## 2022-08-03 DIAGNOSIS — E11622 Type 2 diabetes mellitus with other skin ulcer: Secondary | ICD-10-CM | POA: Insufficient documentation

## 2022-08-03 DIAGNOSIS — I70239 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: Secondary | ICD-10-CM | POA: Insufficient documentation

## 2022-08-03 DIAGNOSIS — E1151 Type 2 diabetes mellitus with diabetic peripheral angiopathy without gangrene: Secondary | ICD-10-CM | POA: Diagnosis not present

## 2022-08-03 DIAGNOSIS — I70232 Atherosclerosis of native arteries of right leg with ulceration of calf: Secondary | ICD-10-CM | POA: Diagnosis not present

## 2022-08-03 DIAGNOSIS — L97112 Non-pressure chronic ulcer of right thigh with fat layer exposed: Secondary | ICD-10-CM | POA: Diagnosis not present

## 2022-08-03 DIAGNOSIS — I129 Hypertensive chronic kidney disease with stage 1 through stage 4 chronic kidney disease, or unspecified chronic kidney disease: Secondary | ICD-10-CM | POA: Diagnosis not present

## 2022-08-04 DIAGNOSIS — L309 Dermatitis, unspecified: Secondary | ICD-10-CM | POA: Diagnosis not present

## 2022-08-10 ENCOUNTER — Ambulatory Visit: Payer: Medicare Other | Admitting: Physician Assistant

## 2022-08-10 ENCOUNTER — Ambulatory Visit (INDEPENDENT_AMBULATORY_CARE_PROVIDER_SITE_OTHER)
Admission: RE | Admit: 2022-08-10 | Discharge: 2022-08-10 | Disposition: A | Discharge status: Home / Self Care | Payer: Medicare Other | Source: Ambulatory Visit | Attending: Surgery | Admitting: Surgery

## 2022-08-10 ENCOUNTER — Ambulatory Visit (HOSPITAL_COMMUNITY)
Admission: RE | Admit: 2022-08-10 | Discharge: 2022-08-10 | Disposition: A | Discharge status: Home / Self Care | Payer: Medicare Other | Source: Ambulatory Visit | Attending: Surgery | Admitting: Surgery

## 2022-08-10 ENCOUNTER — Encounter (HOSPITAL_BASED_OUTPATIENT_CLINIC_OR_DEPARTMENT_OTHER): Payer: Medicare Other | Admitting: General Surgery

## 2022-08-10 VITALS — BP 131/74 | HR 85 | Temp 97.9°F | Wt 224.0 lb

## 2022-08-10 DIAGNOSIS — L97122 Non-pressure chronic ulcer of left thigh with fat layer exposed: Secondary | ICD-10-CM | POA: Diagnosis not present

## 2022-08-10 DIAGNOSIS — I739 Peripheral vascular disease, unspecified: Secondary | ICD-10-CM | POA: Insufficient documentation

## 2022-08-10 DIAGNOSIS — I129 Hypertensive chronic kidney disease with stage 1 through stage 4 chronic kidney disease, or unspecified chronic kidney disease: Secondary | ICD-10-CM | POA: Diagnosis not present

## 2022-08-10 DIAGNOSIS — E1151 Type 2 diabetes mellitus with diabetic peripheral angiopathy without gangrene: Secondary | ICD-10-CM | POA: Diagnosis not present

## 2022-08-10 DIAGNOSIS — E1122 Type 2 diabetes mellitus with diabetic chronic kidney disease: Secondary | ICD-10-CM | POA: Diagnosis not present

## 2022-08-10 DIAGNOSIS — T8149XA Infection following a procedure, other surgical site, initial encounter: Secondary | ICD-10-CM

## 2022-08-10 DIAGNOSIS — I70738 Atherosclerosis of other type of bypass graft(s) of the right leg with ulceration of other part of lower leg: Secondary | ICD-10-CM | POA: Diagnosis not present

## 2022-08-10 DIAGNOSIS — I890 Lymphedema, not elsewhere classified: Secondary | ICD-10-CM | POA: Diagnosis not present

## 2022-08-10 DIAGNOSIS — L97112 Non-pressure chronic ulcer of right thigh with fat layer exposed: Secondary | ICD-10-CM | POA: Diagnosis not present

## 2022-08-10 DIAGNOSIS — N189 Chronic kidney disease, unspecified: Secondary | ICD-10-CM | POA: Diagnosis not present

## 2022-08-10 DIAGNOSIS — L97815 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: Secondary | ICD-10-CM | POA: Diagnosis not present

## 2022-08-10 DIAGNOSIS — I70239 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: Secondary | ICD-10-CM | POA: Diagnosis not present

## 2022-08-10 DIAGNOSIS — T8131XA Disruption of external operation (surgical) wound, not elsewhere classified, initial encounter: Secondary | ICD-10-CM | POA: Diagnosis not present

## 2022-08-10 DIAGNOSIS — E11622 Type 2 diabetes mellitus with other skin ulcer: Secondary | ICD-10-CM | POA: Diagnosis not present

## 2022-08-10 DIAGNOSIS — E785 Hyperlipidemia, unspecified: Secondary | ICD-10-CM | POA: Diagnosis not present

## 2022-08-10 LAB — VAS US ABI WITH/WO TBI
Left ABI: 1.22
Right ABI: 1.09

## 2022-08-10 NOTE — Progress Notes (Signed)
Office Note     CC:  follow up Requesting Provider:  Stamey, Verda Cumins, FNP  HPI: Michael [name] is a 72 y.o. (1950-07-14) male who presents for routine follow up of peripheral artery disease. He has undergone numerous lower extremity interventions. He most recently had an arteriogram with balloon angioplasty of the distal anastomosis of the bypass graft in August of 2023. His right femoral to above knee popliteal artery bypass graft with saphenous vein was placed in February of 2023 by Dr. Myra Gianotti.  He unfortunately developed an infection at one of his saphenectomy sites so he had to be taken to OR for debridement in March of 2023 on 1 separate occasions. At time of  his last visit in November he was under the management of the wound center but his wounds were almost completely healed. He otherwise was without any PAD symptoms.   Today he reports he is doing really well. He is still going to the wound care center but anticipates being discharged after his next visit on 6/17. His wound at the ankle is healed and the medial distal thigh wound is almost completely healed. He denies any pain on ambulation or rest. No tissue loss. He Says he has been back to playing some golf. He still is trying to get his blood sugars and pressure under control before he gets out to do much walking or PT. He had several episodes of " blacking out" due to his sugars being too low. He is otherwise compliant with is Aspirin, statin and Plavix.   Previous intervention history: 11/05/2010: Stent, right superficial femoral artery (claudication) 09/01/2016: Atherectomy and drug-coated balloon angioplasty, right superficial femoral artery (in-stent stenosis 03/16/2017: Stent, right superficial femoral artery (in-stent stenosis) 01/30/2021: Drug-coated balloon angioplasty, right superficial femoral artery (in-stent stenosis) 04/16/2021: Right femoral to above-knee popliteal artery bypass graft with saphenous vein  (claudication) 05/02/2021: Excision and debridement of vein harvest incisions 05/05/2021: I&D vein harvest incisions 10/15/2021: Drug-coated balloon angioplasty of the distal femoral-popliteal bypass graft with repeat I&D of the right leg wound  Past Medical History:  Diagnosis Date   Allergy    Anxiety    Arthritis    psoriatic arthritis   Diabetes mellitus    GERD (gastroesophageal reflux disease)    Hyperlipidemia    Hypertension    PAD (peripheral artery disease) (HCC)    a. s/p prior LE stenting 2012, 03/2017 - followed by [facility].   Prostate cancer (HCC)    Sleep apnea 2016   wears CPAP sometimes    Past Surgical History:  Procedure Laterality Date   ABDOMINAL AORTOGRAM N/A 09/01/2016   Procedure: Abdominal Aortogram;  Surgeon: Nada Libman, MD;  Location: [facility];  Service: Cardiovascular;  Laterality: N/A;   ABDOMINAL AORTOGRAM W/LOWER EXTREMITY N/A 03/16/2017   Procedure: ABDOMINAL AORTOGRAM W/LOWER EXTREMITY;  Surgeon: Nada Libman, MD;  Location: [facility];  Service: Cardiovascular;  Laterality: N/A;   ANGIOPLASTY Right 01/30/2021   Procedure: BALLOON ANGIOPLASTY;  Surgeon: Nada Libman, MD;  Location: [facility] OR;  Service: Vascular;  Laterality: Right;   ANGIOPLASTY Right 10/15/2021   Procedure: RIGHT SUPERFICIAL FEMORAL ARTERY BALLOON ANGIOPLASTY;  Surgeon: Nada Libman, MD;  Location: [facility] OR;  Service: Vascular;  Laterality: Right;   ANGIOPLASTY / STENTING FEMORAL  11/05/2010   Left SFA stent   AORTOGRAM N/A 10/15/2021   Procedure: ABDOMINAL AORTOGRAM;  Surgeon: Nada Libman, MD;  Location: [facility];  Service: Vascular;  Laterality: N/A;  APPLICATION OF WOUND VAC Right 04/16/2021   Procedure: APPLICATION OF WOUND VAC;  Surgeon: Nada Libman, MD;  Location: [facility] OR;  Service: Vascular;  Laterality: Right;   APPLICATION OF WOUND VAC Right 05/02/2021   Procedure: APPLICATION OF WOUND VAC;  Surgeon: Chuck Hint, MD;  Location: [facility] OR;   Service: Vascular;  Laterality: Right;   APPLICATION OF WOUND VAC Right 05/05/2021   Procedure: WOUND VAC CHANGE RIGHT GROIN, APPLICATION OF WOUND VAC PROXIMAL AND DISTAL MEDIAL RIGHT THIGH;  Surgeon: Leonie Douglas, MD;  Location: [facility] OR;  Service: Vascular;  Laterality: Right;   ENDARTERECTOMY FEMORAL Right 04/16/2021   Procedure: RIGHT FEMORAL ENDARTERECTOMY;  Surgeon: Nada Libman, MD;  Location: [facility];  Service: Vascular;  Laterality: Right;   EYE SURGERY  2010   bilateral cataract   FEMORAL-POPLITEAL BYPASS GRAFT Right 04/16/2021   Procedure: RIGHT FEMORAL-POPLITEAL BYPASS USING VEIN;  Surgeon: Nada Libman, MD;  Location: [facility];  Service: Vascular;  Laterality: Right;   GROIN DEBRIDEMENT Right 05/05/2021   Procedure: IRRIGATION AND DEBRIDEMENT RIGHT GROIN AND RIGHT THIGH;  Surgeon: Leonie Douglas, MD;  Location: [facility];  Service: Vascular;  Laterality: Right;   I & D EXTREMITY Right 05/02/2021   Procedure: IRRIGATION AND DEBRIDEMENT RIGHT LEG;  Surgeon: Chuck Hint, MD;  Location: [facility] OR;  Service: Vascular;  Laterality: Right;   I & D EXTREMITY Right 10/15/2021   Procedure: IRRIGATION AND DEBRIDEMENT RIGHT LEG;  Surgeon: Nada Libman, MD;  Location: [facility];  Service: Vascular;  Laterality: Right;   KNEE ARTHROSCOPY     LOWER EXTREMITY ANGIOGRAM Right 01/30/2021   Procedure: LOWER EXTREMITY ANGIOGRAM WITH ARTERIOGRAM OF RIGHT LOWER EXTREMITY;  Surgeon: Nada Libman, MD;  Location: [facility];  Service: Vascular;  Laterality: Right;   LOWER EXTREMITY ANGIOGRAPHY N/A 09/01/2016   Procedure: Lower Extremity Angiography;  Surgeon: Nada Libman, MD;  Location: [facility];  Service: Cardiovascular;  Laterality: N/A;   PERIPHERAL VASCULAR ATHERECTOMY Right 09/01/2016   Procedure: Peripheral Vascular Atherectomy;  Surgeon: Nada Libman, MD;  Location: [facility];  Service: Cardiovascular;  Laterality: Right;  superficial femoral   PERIPHERAL VASCULAR  INTERVENTION Right 03/16/2017   Procedure: PERIPHERAL VASCULAR INTERVENTION;  Surgeon: Nada Libman, MD;  Location: [facility];  Service: Cardiovascular;  Laterality: Right;  superficicial femoral   ROTATOR CUFF REPAIR     1990   ROTATOR CUFF REPAIR Bilateral 2017   ULTRASOUND GUIDANCE FOR VASCULAR ACCESS Left 01/30/2021   Procedure: ULTRASOUND GUIDANCE FOR VASCULAR ACCESS;  Surgeon: Nada Libman, MD;  Location: [facility];  Service: Vascular;  Laterality: Left;   ULTRASOUND GUIDANCE FOR VASCULAR ACCESS Left 10/15/2021   Procedure: ULTRASOUND GUIDANCE FOR VASCULAR ACCESS;  Surgeon: Nada Libman, MD;  Location: [facility];  Service: Vascular;  Laterality: Left;   VEIN HARVEST Right 04/16/2021   Procedure: RIGHT GREATER SAPHENOUS VEIN HARVEST;  Surgeon: Nada Libman, MD;  Location: [facility];  Service: Vascular;  Laterality: Right;    Social History   Socioeconomic History   Marital status: Married    Spouse name: Not on file   Number of children: Not on file   Years of education: Not on file   Highest education level: Not on file  Occupational History   Not on file  Tobacco Use   Smoking status: Never    Passive exposure: Never   Smokeless tobacco: Former    Quit  date: 10/26/1981  Vaping Use   Vaping Use: Never used  Substance and Sexual Activity   Alcohol use: Yes    Comment: occasional drink, maybe a glass of wine once every 6 months   Drug use: No   Sexual activity: Not on file  Other Topics Concern   Not on file  Social History Narrative   Not on file   Social Determinants of Health   Financial Resource Strain: Not on file  Food Insecurity: Not on file  Transportation Needs: Not on file  Physical Activity: Not on file  Stress: Not on file  Social Connections: Not on file  Intimate Partner Violence: Not on file    Family History  Problem Relation Age of Onset   Hyperlipidemia Mother    Hypertension Mother    Heart disease Father    Deep vein  thrombosis Father    Hyperlipidemia Father    Hypertension Father    Heart attack Father    Peripheral vascular disease Father    Cancer Sister     Current Outpatient Medications  Medication Sig Dispense Refill   ACCU-CHEK AVIVA PLUS test strip AS DIRECTED SEVEN TIMES A DAY IN VITRO 90 DAYS  4   acetaminophen (TYLENOL) 500 MG tablet Take 1,000 mg by mouth every 6 (six) hours as needed for moderate pain.     aspirin EC 81 MG tablet Take 1 tablet (81 mg total) by mouth daily.     atorvastatin (LIPITOR) 40 MG tablet Take 40 mg by mouth at bedtime.  5   benazepril (LOTENSIN) 40 MG tablet Take 40 mg by mouth daily.     cefadroxil (DURICEF) 500 MG capsule Take 2 capsules (1,000 mg total) by mouth 2 (two) times daily. 56 capsule 0   clonazePAM (KLONOPIN) 1 MG tablet Take 2 mg by mouth at bedtime as needed (sleep).     clopidogrel (PLAVIX) 75 MG tablet TAKE 1 TABLET BY MOUTH EVERY DAY WITH BREAKFAST 90 tablet 3   Continuous Blood Gluc Receiver (FREESTYLE LIBRE 14 DAY READER) DEVI use to monitor blood sugar     dapagliflozin propanediol (FARXIGA) 5 MG TABS tablet Take 5 mg by mouth daily.     diphenhydrAMINE (BENADRYL) 25 MG tablet Take 25 mg by mouth at bedtime as needed for sleep.     HUMALOG KWIKPEN 100 UNIT/ML KwikPen Inject 30 Units into the skin 2 (two) times daily.     hydrochlorothiazide (HYDRODIURIL) 25 MG tablet Take 1 tablet (25 mg total) by mouth daily. Please make overdue appt with Dr. Eldridge Dace before anymore refills. 2nd attempt 15 tablet 0   insulin glargine (LANTUS) 100 UNIT/ML injection Inject 20 Units into the skin at bedtime.     metFORMIN (GLUCOPHAGE-XR) 500 MG 24 hr tablet Take 2,000 mg by mouth at bedtime.     solifenacin (VESICARE) 5 MG tablet Take 5 mg by mouth every evening.     tamsulosin (FLOMAX) 0.4 MG CAPS capsule Take 0.4 mg by mouth at bedtime.  5   traZODone (DESYREL) 50 MG tablet Take 50 mg by mouth at bedtime as needed for sleep.     trolamine salicylate  (ASPERCREME) 10 % cream Apply 1 Application topically as needed for muscle pain.     No current facility-administered medications for this visit.    Allergies  Allergen Reactions   Demerol Anaphylaxis   Lactose Intolerance (Gi) Anaphylaxis   Oxycodone Anxiety and Other (See Comments)    Suicidal ideation   Penicillins Anaphylaxis and  Other (See Comments)    Tolerates cefepime    Actos [Pioglitazone] Nausea Only   Exenatide Nausea Only and Other (See Comments)   Glimepiride Other (See Comments)    Edema in ankles    Nabumetone Nausea Only and Other (See Comments)   Victoza [Liraglutide] Nausea Only    Shaking   Fentanyl Anxiety    Jittery    Hydrocodone Anxiety    Suicidal ideation     REVIEW OF SYSTEMS:  [X]  denotes positive finding, [ ]  denotes negative finding Cardiac  Comments:  Chest pain or chest pressure:    Shortness of breath upon exertion:    Short of breath when lying flat:    Irregular heart rhythm:        Vascular    Pain in calf, thigh, or hip brought on by ambulation:    Pain in feet at night that wakes you up from your sleep:     Blood clot in your veins:    Leg swelling:  X       Pulmonary    Oxygen at home:    Productive cough:     Wheezing:         Neurologic    Sudden weakness in arms or legs:     Sudden numbness in arms or legs:     Sudden onset of difficulty speaking or slurred speech:    Temporary loss of vision in one eye:     Problems with dizziness:         Gastrointestinal    Blood in stool:     Vomited blood:         Genitourinary    Burning when urinating:     Blood in urine:        Psychiatric    Major depression:         Hematologic    Bleeding problems:    Problems with blood clotting too easily:        Skin    Rashes or ulcers:        Constitutional    Fever or chills:      PHYSICAL EXAMINATION:  Vitals:   08/10/22 1352  BP: 131/74  Pulse: 85  Temp: 97.9 F (36.6 C)  TempSrc: Temporal  SpO2: 97%   Weight: 224 lb (101.6 kg)    General:  WDWN in NAD; vital signs documented above Gait: Normal HENT: WNL, normocephalic Pulmonary: normal non-labored breathing , without wheezing Cardiac: regular HR Abdomen: soft, NT, no masses Vascular Exam/Pulses: 2+ femoral, 2+ DP pulses bilaterally Extremities: without ischemic changes, without Gangrene , without cellulitis; without open wounds;  Musculoskeletal: no muscle wasting or atrophy  Neurologic: A&O X 3 Psychiatric:  The pt has Normal affect.   Non-Invasive Vascular Imaging:   -------+-----------+-----------+------------+------------+  ABI/TBIToday's ABIToday's TBIPrevious ABIPrevious TBI  +-------+-----------+-----------+------------+------------+  Right 1.09       0.78       0.87        0.69          +-------+-----------+-----------+------------+------------+  Left  1.22       1.03       1.11        0.86          +-------+-----------+-----------+------------+------------+  Triphasic waveforms bilaterally  Right Graft #1: Fem- pop  +------------------+--------+--------+---------+--------+                   PSV cm/sStenosisWaveform Comments  +------------------+--------+--------+---------+--------+  Inflow  61              triphasic          +------------------+--------+--------+---------+--------+  Prox Anastomosis  75              triphasic          +------------------+--------+--------+---------+--------+  Proximal Graft    123             triphasic          +------------------+--------+--------+---------+--------+  Mid Graft         132             triphasic          +------------------+--------+--------+---------+--------+  Distal Graft      172             triphasic          +------------------+--------+--------+---------+--------+  Distal Anastomosis152             triphasic          +------------------+--------+--------+---------+--------+  Outflow           68              triphasic          +------------------+--------+--------+---------+--------+   Summary:  Right: Patent Right femoral-popliteal artery bypass without evidence of stenosis.   ASSESSMENT/PLAN:: 72 y.o. male here for follow up for of peripheral artery disease. He has undergone numerous lower extremity interventions. He most recently had an arteriogram with balloon angioplasty of the distal anastomosis of the bypass graft in August of 2023. His right femoral to above knee popliteal artery bypass graft with saphenous vein was placed in February of 2023 by Dr. Myra Gianotti.  He unfortunately developed an infection at one of his saphenectomy sites so he had to be taken to OR for debridement in March of 2023 on 1 separate occasions. He has been under the management of the wound care center for his wounds. He anticipates getting discharged next week. He is without any claudication, rest pain, or tissue loss.  - ABI improved bilaterally. Duplex shows patent right fem- popliteal bypass graft - Continue Aspirin, statin and Plavix  - He will follow up in 6 months with ABI and Bypass graft duplex   Graceann Congress, PA-C [facility] [phone]  Clinic MD:   Myra Gianotti

## 2022-08-17 ENCOUNTER — Ambulatory Visit (HOSPITAL_BASED_OUTPATIENT_CLINIC_OR_DEPARTMENT_OTHER): Payer: Medicare Other | Admitting: General Surgery

## 2022-08-17 DIAGNOSIS — M79672 Pain in left foot: Secondary | ICD-10-CM | POA: Diagnosis not present

## 2022-08-17 DIAGNOSIS — B351 Tinea unguium: Secondary | ICD-10-CM | POA: Diagnosis not present

## 2022-08-17 DIAGNOSIS — E1151 Type 2 diabetes mellitus with diabetic peripheral angiopathy without gangrene: Secondary | ICD-10-CM | POA: Diagnosis not present

## 2022-08-19 ENCOUNTER — Other Ambulatory Visit: Payer: Self-pay

## 2022-08-19 DIAGNOSIS — I739 Peripheral vascular disease, unspecified: Secondary | ICD-10-CM

## 2022-08-20 DIAGNOSIS — E538 Deficiency of other specified B group vitamins: Secondary | ICD-10-CM | POA: Diagnosis not present

## 2022-08-20 DIAGNOSIS — E559 Vitamin D deficiency, unspecified: Secondary | ICD-10-CM | POA: Diagnosis not present

## 2022-08-20 DIAGNOSIS — R5383 Other fatigue: Secondary | ICD-10-CM | POA: Diagnosis not present

## 2022-08-24 ENCOUNTER — Encounter (HOSPITAL_BASED_OUTPATIENT_CLINIC_OR_DEPARTMENT_OTHER): Payer: Medicare Other | Admitting: General Surgery

## 2022-08-24 DIAGNOSIS — E119 Type 2 diabetes mellitus without complications: Secondary | ICD-10-CM | POA: Diagnosis not present

## 2022-08-24 DIAGNOSIS — E1151 Type 2 diabetes mellitus with diabetic peripheral angiopathy without gangrene: Secondary | ICD-10-CM | POA: Diagnosis not present

## 2022-08-24 DIAGNOSIS — E11622 Type 2 diabetes mellitus with other skin ulcer: Secondary | ICD-10-CM | POA: Diagnosis not present

## 2022-08-24 DIAGNOSIS — E785 Hyperlipidemia, unspecified: Secondary | ICD-10-CM | POA: Diagnosis not present

## 2022-08-24 DIAGNOSIS — I7389 Other specified peripheral vascular diseases: Secondary | ICD-10-CM | POA: Diagnosis not present

## 2022-08-24 DIAGNOSIS — N189 Chronic kidney disease, unspecified: Secondary | ICD-10-CM | POA: Diagnosis not present

## 2022-08-24 DIAGNOSIS — J329 Chronic sinusitis, unspecified: Secondary | ICD-10-CM | POA: Diagnosis not present

## 2022-08-24 DIAGNOSIS — I10 Essential (primary) hypertension: Secondary | ICD-10-CM | POA: Diagnosis not present

## 2022-08-24 DIAGNOSIS — L97122 Non-pressure chronic ulcer of left thigh with fat layer exposed: Secondary | ICD-10-CM | POA: Diagnosis not present

## 2022-08-24 DIAGNOSIS — L97815 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: Secondary | ICD-10-CM | POA: Diagnosis not present

## 2022-08-24 DIAGNOSIS — E1122 Type 2 diabetes mellitus with diabetic chronic kidney disease: Secondary | ICD-10-CM | POA: Diagnosis not present

## 2022-08-24 DIAGNOSIS — I70239 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: Secondary | ICD-10-CM | POA: Diagnosis not present

## 2022-08-24 DIAGNOSIS — I129 Hypertensive chronic kidney disease with stage 1 through stage 4 chronic kidney disease, or unspecified chronic kidney disease: Secondary | ICD-10-CM | POA: Diagnosis not present

## 2022-08-24 DIAGNOSIS — I890 Lymphedema, not elsewhere classified: Secondary | ICD-10-CM | POA: Diagnosis not present

## 2022-08-24 NOTE — Progress Notes (Signed)
Michael [name], Michael [name] ([id number]) [id number] Page 1 of 9 Visit Report for 08/24/2022 Chief Complaint Document Details Patient Name: Date of Service: Michael [name], [name] 08/24/2022 9:15 A M Medical Record Number: [id number] Patient Account Number: [id number] Date of Birth/Sex: Treating RN: 07-10-1950 (72 y.o. M) Primary Care Provider: Kae [name] Other Clinician: Referring Provider: Treating Provider/Extender: Michael [name] in Treatment: 37 Information Obtained from: Patient Chief Complaint Patient presents to the wound care center today with an open arterial ulcer to the right lower extremity in the setting of diabetes mellitus. he has had this problem for 7 months 12/04/2021: ulcer to right lower anterior tibial surface Electronic Signature(s) Signed: 08/24/2022 9:18:32 AM By: Michael Guess MD FACS Entered By: Michael [name] on 08/24/2022 09:18:31 -------------------------------------------------------------------------------- HPI Details Patient Name: Date of Service: Michael [name], Michael LTER C. 08/24/2022 9:15 A M Medical Record Number: [id number] Patient Account Number: [id number] Date of Birth/Sex: Treating RN: January 13, 1951 (72 y.o. M) Primary Care Provider: Kae [name] Other Clinician: Referring Provider: Treating Provider/Extender: Michael [name] in Treatment: 37 History of Present Illness Location: right lateral calf closer to the knee Quality: Patient reports experiencing a dull pain to affected area(s). Severity: Patient states wound(s) are getting worse. Duration: Patient has had the wound for > 7 months prior to seeking treatment at the wound center Timing: Pain in wound is constant (hurts all the time) Context: The wound would happen gradually Modifying Factors: Patient wound(s)/ulcer(s) are worsening due to : no resolution and a white material at the base of the wound ssociated Signs and  Symptoms: Patient reports having:no discharge or purulent material A HPI Description: 72 year old gentleman who has been referred to was from his PCP for a chronic ulceration on his right lower extremity which she's had for several weeks. Past medical history significant for diabetes mellitus type 2, hypertension, hyperlipidemia, anxiety, obesity, peripheral vascular disease and chronic kidney disease. He has never been a smoker. Most recent lab work done at his PCPs office showed a glucose of 217 milligrams per deciliter which is consistent with hyperglycemia. In January 2017 recent hemoglobin A1c values noted to be 8.2%. In April 2017, he has been seen at the vascular office by Dr. Myra [name] and Dr. Hart [name] for right leg claudication. He had a right superficial femoral artery stent in September 2012. Recent noninvasive vascular imaging done on 06/24/2015 showed a right ABI of 1.07 with a triphasic waveform and a right TBI of 0.81. The left was noncompressible with a triphasic waveform and a TBI of 1.17. Right lower extremity arterial duplex was unable to identify stent exit but they were elevated velocities at the stent and in the distal thigh with triphasic waveforms. Dr. Estanislado [name] opinion was to return to the clinic in 6 months with ABI and right lower extremity arterial duplex studies to be done. He has seen a dermatologist who had done a biopsy and said it was benign and he was given a steroid cream. 10/23/2015 -- Pathology report of the biopsy done in April 2017 shows ulcer with underlying angiodermatitis consistent with stasis dermatitis. 10/30/2015 -- he is going for shoulder surgery in about 10 days' time and was asking about the perioperative care. His blood sugars are running in the high 100s or low 200s. No hemoglobin A1c done recently. 11/20/2015 -- is back up to 2 weeks because of recent left shoulder surgery. 12/04/15; patient returns today with the wound for the most part looking  healthy. No evidence of infection  no debridement required. He is using Prisma for 4 weeks, without obvious improvement per her intake nurse. This started as several small open areas that were raised erythematous. He saw dermatology and at some point this was biopsied that just suggested stasis skin physiology. This certainly doesn't look like that 12/11/15; using Hydrafera Blue. Previous biopsy reviewed, no atypia PAS negative. He has a history of stents in the right leg however he does not appear to Michael [name] ([id number]) [id number] Page 2 of 9 have primary arterial insufficiency ABI in this clinic was over 0.9. 12/25/2015 -- he has been approved for grafix and we will order for some to be applied next week 01/01/2016 -- he has had his first application of Grafix today 01/08/2016 -- he has had his second application of Grafix today READMISSION 12/04/2021 This is a now 72 year old man who was followed in our clinic about 5 years ago for an ulceration on his right lateral lower leg, just distal to the knee. He has since undergone a number of revascularization procedures that have been complicated by restenosis and wound infections. During the course of this treatment, he developed a small ulcer on his right anterior tibial surface. Despite use of an Unna boot, the wound has continued to expand. He was referred to the wound care center by Dr. Myra [name] for further evaluation and management. On the right anterior tibial surface, there is an irregular wound with heavy slough and eschar accumulation. The periwound skin is intact but he does have 1-2+ pitting edema. There is no purulent drainage or malodor. 10/13; second visit for this man who has an ischemic wound in the setting of type 2 diabetes on the right anterior mid tibia area. He has been revascularized. Using Santyl gent 12/22/2021: The wound is about the same size this week. There is a lot of gray sloughy  material on the surface, secondary to silver nitrate used to stop bleeding after what sounds like a fairly aggressive debridement last week. 12/30/2021: The wound is smaller this week and a bit cleaner. Edema control is excellent. There is still a bit of slough accumulation. 01/07/2022: The wound continues to contract and is much cleaner this week. Edema control is very good. He has small openings in his upper leg surgical scars, but he is going to be seeing Dr. Myra [name] next Monday and will have him take a look. He has been applying manuka honey to both of the sites. We have been using Santyl and gentamicin on his lower leg wound. 01/14/2022: He saw vascular surgery this week and was told that they were happy to have [place] manage the 2 wounds from his operation. Both of these have a light layer of slough on the surface. The more distal wound is a little bit dry. The anterior tibial wound continues to accumulate a fair amount of slough. Edema control is good. 01/19/2022: Both upper leg wounds are smaller. The more distal is quite dry and cracked when I was examining it. The lower leg wound is more painful and the surrounding tissue is erythematous and indurated. 01/26/2022: The upper leg wounds are healed. The skin at the distal leg wound is quite dry. The lower leg wound is cleaner but still fairly painful. His wrap slid again this week. 02/02/2022: We had good success keeping his wrap intact using a standard Unna boot. The lower leg wound continues to contract and is clean and less painful this week. 12/11; TheraSkin #1 12/18; TheraSkin #2. Wound looks as though  it is contracted. Patient states that his pain was better in the leg. Secondary dressing and kerlix Coban 02/24/2022: The wound is smaller since I saw it last. It is less painful and there is good granulation tissue, including over the exposed tendon. 03/09/2022: The wound continues to contract. There is continued fill of the wound cavity with  granulation tissue. 03/16/2022: The wound is smaller again today. There is good granulation tissue forming, covering the tendon. 03/30/2022: The wound continues to contract and fill with good granulation tissue. The granulation tissue is oozing a little bit at the lateral edge. 04/13/2022: We left his TheraSkin on an extra week because it was looking so good. The wound is smaller this week and flush with the surrounding skin. His medial thigh wound has opened up again. There is slough and eschar present. 04/20/2022: The anterior tibial wound continues to contract. There is good granulation tissue with just a light layer of slough. The medial thigh ulcer is superficial with slough present. It is a bit fibrotic due to being in a scar. 04/27/2022: The anterior tibial wound continues to contract. There is still some slough buildup with good granulation tissue. The medial thigh ulcer is very leathery and dry. 05/04/2022: The anterior tibial wound is even smaller today. There is no visible tendon at all. He has some slough and eschar accumulation. The medial thigh ulcer continues to be quite dry with a leathery surface. 05/11/2022: The anterior tibial wound continues to contract. There is a little bit of eschar and slough buildup. The granulation tissue is also a bit hypertrophic. The medial thigh ulcer is dry and leathery, once again. 05/25/2022: The anterior tibial wound is quite a bit smaller this week. There is a little bit of eschar and slough. No reaccumulation of hypertrophic granulation tissue. The medial thigh ulcer has slightly improved tissue quality. It is still somewhat fibrotic, but there is at least a light pink color to the tissue. 06/01/2022: Both wounds are smaller. There is hypertrophic granulation tissue on the anterior tibial wound. The quality of the tissue continues to improve on the medial thigh ulcer. 06/08/2022: The anterior tibial wound got a little bit macerated around the edges and the  wound measures a bit larger; there is still some hypertrophic granulation tissue present. The medial thigh ulcer is measuring smaller and the quality of tissue continues to improve. There is some slough and eschar present. 06/15/2022: The anterior tibial wound looks substantially better. The periwound skin has improved and the hypertrophic granulation tissue has resolved. There is some slough and eschar on the surface. The medial thigh ulcer is about the same size, but with continued improvement in the tissue quality. Slough and eschar is also present here. 06/22/2022: The anterior tibial wound is a little bit fibrotic today, but smaller. The medial thigh wound is unchanged in size and also remains somewhat fibrotic as well. 06/29/2022: Both wounds are smaller today. There is a little bit of eschar and slough on both. In particular, the moisture balance of the thigh wound has improved significantly. 07/06/2022: The anterior tibial ulcer is nearly closed with just 2 small open areas remaining with slough accumulation. The medial thigh ulcer is smaller by about 25% and the moisture balance continues to improve. There is some slough and eschar buildup. Michael [name], Michael [name] ([id number]) [id number] Page 3 of 9 07/13/2022: The anterior tibial ulcer looks about the same, perhaps slightly smaller with slough and eschar buildup. The medial thigh ulcer continues to contract and has a  very good moisture balance. 07/20/2022: The anterior tibial ulcer is smaller today with just some eschar overlying 2 small openings. The medial thigh ulcer is also smaller today with some dry eschar around the edges and slough on the surface. 07/28/2022: The anterior tibial wound has healed. The medial thigh ulcer is about half the size as it was last week but has dry eschar around the edges and a little bit of slough on the surface. 08/03/2022: The anterior tibial wound remains closed. The medial thigh ulcer is  half again smaller this week. The moisture balance is excellent. 08/10/2022: The medial thigh ulcer is down to just a couple of millimeters underneath a layer of eschar and dried endoform. 08/24/2022: His wound is healed. Electronic Signature(s) Signed: 08/24/2022 9:18:45 AM By: Michael Guess MD FACS Entered By: Michael [name] on 08/24/2022 09:18:45 -------------------------------------------------------------------------------- Physical Exam Details Patient Name: Date of Service: Michael [name], Michael LTER C. 08/24/2022 9:15 A M Medical Record Number: [id number] Patient Account Number: [id number] Date of Birth/Sex: Treating RN: Jul 31, 1950 (72 y.o. M) Primary Care Provider: Kae [name] Other Clinician: Referring Provider: Treating Provider/Extender: Michael [name] in Treatment: 37 Constitutional Slightly hypertensive. Slightly tachycardic. . . no acute distress. Notes 08/24/2022: Under a thin layer of eschar, his wound is healed. Electronic Signature(s) Signed: 08/24/2022 9:20:31 AM By: Michael Guess MD FACS Entered By: Michael [name] on 08/24/2022 09:20:31 -------------------------------------------------------------------------------- Physician Orders Details Patient Name: Date of Service: Michael [name], Michael LTER C. 08/24/2022 9:15 A M Medical Record Number: [id number] Patient Account Number: [id number] Date of Birth/Sex: Treating RN: May 01, 1950 (72 y.o. [name] Primary Care Provider: Kae [name] Other Clinician: Referring Provider: Treating Provider/Extender: Michael [name] in Treatment: [phone] Verbal / Phone Orders: No Diagnosis Coding ICD-10 Coding Code Description [phone] Non-pressure chronic ulcer of left thigh with fat layer exposed I70.239 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 E11.622 Type 2 diabetes mellitus with other skin ulcer I73.9 Peripheral vascular disease,  unspecified Discharge From [facility] METE, PURDUM C ([id number]) [id number] Page 4 of 9 Discharge from Wound Care Center - Congratulations!!!!!!!!!! Edema Control - Lymphedema / SCD / Other Right Lower Extremity Elevate legs to the level of the heart or above for 30 minutes daily and/or when sitting for 3-4 times a day throughout the day. Avoid standing for long periods of time. Moisturize legs daily. Electronic Signature(s) Signed: 08/24/2022 9:20:41 AM By: Michael Guess MD FACS Entered By: Michael [name] on 08/24/2022 09:20:41 -------------------------------------------------------------------------------- Problem List Details Patient Name: Date of Service: Michael [name], Michael LTER C. 08/24/2022 9:15 A M Medical Record Number: [id number] Patient Account Number: [id number] Date of Birth/Sex: Treating RN: [birth year] (72 y.o. M) Primary Care Provider: Kae [name] Other Clinician: Referring Provider: Treating Provider/Extender: Michael [name] in Treatment: 37 Active Problems ICD-10 Encounter Code Description Active Date MDM Diagnosis L97.122 Non-pressure chronic ulcer of left thigh with fat layer exposed 01/14/2022 No Yes I70.239 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 12/04/2021 No Yes E11.622 Type 2 diabetes mellitus with other skin ulcer 12/04/2021 No Yes I73.9 Peripheral vascular disease, unspecified 12/04/2021 No Yes Inactive Problems Resolved Problems ICD-10 Code Description Active Date Resolved Date L97.815 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 12/04/2021 12/04/2021 without evidence of necrosis Electronic Signature(s) Signed: 08/24/2022 9:18:13 AM By: Michael Guess MD FACS Entered By: Michael [name] on 08/24/2022 09:18:13 Michael [name], Michael [name] ([id number]) [id number] Page  5 of  9 -------------------------------------------------------------------------------- Progress Note Details Patient Name: Date of Service: Michael Jacques [place], [name] 08/24/2022 9:15 A M Medical Record Number: [id number] Patient Account Number: [id number] Date of Birth/Sex: Treating RN: 1950-03-24 (72 y.o. M) Primary Care Provider: Kae [name] Other Clinician: Referring Provider: Treating Provider/Extender: Michael [name] in Treatment: 37 Subjective Chief Complaint Information obtained from Patient Patient presents to the wound care center today with an open arterial ulcer to the right lower extremity in the setting of diabetes mellitus. he has had this problem for 7 months 12/04/2021: ulcer to right lower anterior tibial surface History of Present Illness (HPI) The following HPI elements were documented for the patient's wound: Location: right lateral calf closer to the knee Quality: Patient reports experiencing a dull pain to affected area(s). Severity: Patient states wound(s) are getting worse. Duration: Patient has had the wound for > 7 months prior to seeking treatment at the wound center Timing: Pain in wound is constant (hurts all the time) Context: The wound would happen gradually Modifying Factors: Patient wound(s)/ulcer(s) are worsening due to : no resolution and a white material at the base of the wound Associated Signs and Symptoms: Patient reports having:no discharge or purulent material 72 year old gentleman who has been referred to was from his PCP for a chronic ulceration on his right lower extremity which she's had for several weeks. Past medical history significant for diabetes mellitus type 2, hypertension, hyperlipidemia, anxiety, obesity, peripheral vascular disease and chronic kidney disease. He has never been a smoker. Most recent lab work done at his PCPs office showed a glucose of 217 milligrams per deciliter which is consistent with hyperglycemia.  In January 2017 recent hemoglobin A1c values noted to be 8.2%. In April 2017, he has been seen at the vascular office by Dr. Myra [name] and Dr. Hart [name] for right leg claudication. He had a right superficial femoral artery stent in September 2012. Recent noninvasive vascular imaging done on 06/24/2015 showed a right ABI of 1.07 with a triphasic waveform and a right TBI of 0.81. The left was noncompressible with a triphasic waveform and a TBI of 1.17. Right lower extremity arterial duplex was unable to identify stent exit but they were elevated velocities at the stent and in the distal thigh with triphasic waveforms. Dr. Estanislado [name] opinion was to return to the clinic in 6 months with ABI and right lower extremity arterial duplex studies to be done. He has seen a dermatologist who had done a biopsy and said it was benign and he was given a steroid cream. 10/23/2015 -- Pathology report of the biopsy done in April 2017 shows ulcer with underlying angiodermatitis consistent with stasis dermatitis. 10/30/2015 -- he is going for shoulder surgery in about 10 days' time and was asking about the perioperative care. His blood sugars are running in the high 100s or low 200s. No hemoglobin A1c done recently. 11/20/2015 -- is back up to 2 weeks because of recent left shoulder surgery. 12/04/15; patient returns today with the wound for the most part looking healthy. No evidence of infection no debridement required. He is using Prisma for 4 weeks, without obvious improvement per her intake nurse. This started as several small open areas that were raised erythematous. He saw dermatology and at some point this was biopsied that just suggested stasis skin physiology. This certainly doesn't look like that 12/11/15; using Hydrafera Blue. Previous biopsy reviewed, no atypia PAS negative. He has a history of stents in the right leg however he  does not appear to have primary arterial insufficiency ABI in this clinic was over  0.9. 12/25/2015 -- he has been approved for grafix and we will order for some to be applied next week 01/01/2016 -- he has had his first application of Grafix today 01/08/2016 -- he has had his second application of Grafix today READMISSION 12/04/2021 This is a now 72 year old man who was followed in our clinic about 5 years ago for an ulceration on his right lateral lower leg, just distal to the knee. He has since undergone a number of revascularization procedures that have been complicated by restenosis and wound infections. During the course of this treatment, he developed a small ulcer on his right anterior tibial surface. Despite use of an Unna boot, the wound has continued to expand. He was referred to the wound care center by Dr. Myra [name] for further evaluation and management. On the right anterior tibial surface, there is an irregular wound with heavy slough and eschar accumulation. The periwound skin is intact but he does have 1-2+ pitting edema. There is no purulent drainage or malodor. 10/13; second visit for this man who has an ischemic wound in the setting of type 2 diabetes on the right anterior mid tibia area. He has been revascularized. Using Santyl gent 12/22/2021: The wound is about the same size this week. There is a lot of gray sloughy material on the surface, secondary to silver nitrate used to stop bleeding after what sounds like a fairly aggressive debridement last week. 12/30/2021: The wound is smaller this week and a bit cleaner. Edema control is excellent. There is still a bit of slough accumulation. 01/07/2022: The wound continues to contract and is much cleaner this week. Edema control is very good. He has small openings in his upper leg surgical scars, but he is going to be seeing Dr. Myra [name] next Monday and will have him take a look. He has been applying manuka honey to both of the sites. We have been using Santyl and gentamicin on his lower leg wound. 01/14/2022: He  saw vascular surgery this week and was told that they were happy to have [place] manage the 2 wounds from his operation. Both of these have a light layer of slough on the surface. The more distal wound is a little bit dry. The anterior tibial wound continues to accumulate a fair amount of slough. Edema control is good. 01/19/2022: Both upper leg wounds are smaller. The more distal is quite dry and cracked when I was examining it. The lower leg wound is more painful and the surrounding tissue is erythematous and indurated. 01/26/2022: The upper leg wounds are healed. The skin at the distal leg wound is quite dry. The lower leg wound is cleaner but still fairly painful. His wrap slid again this week. 02/02/2022: We had good success keeping his wrap intact using a standard Unna boot. The lower leg wound continues to contract and is clean and less painful this week. Michael [name], Michael [name] ([id number]) [id number] Page 6 of 9 12/11; TheraSkin #1 12/18; TheraSkin #2. Wound looks as though it is contracted. Patient states that his pain was better in the leg. Secondary dressing and kerlix Coban 02/24/2022: The wound is smaller since I saw it last. It is less painful and there is good granulation tissue, including over the exposed tendon. 03/09/2022: The wound continues to contract. There is continued fill of the wound cavity with granulation tissue. 03/16/2022: The wound is smaller again today. There is good granulation tissue  forming, covering the tendon. 03/30/2022: The wound continues to contract and fill with good granulation tissue. The granulation tissue is oozing a little bit at the lateral edge. 04/13/2022: We left his TheraSkin on an extra week because it was looking so good. The wound is smaller this week and flush with the surrounding skin. His medial thigh wound has opened up again. There is slough and eschar present. 04/20/2022: The anterior tibial wound continues to contract. There  is good granulation tissue with just a light layer of slough. The medial thigh ulcer is superficial with slough present. It is a bit fibrotic due to being in a scar. 04/27/2022: The anterior tibial wound continues to contract. There is still some slough buildup with good granulation tissue. The medial thigh ulcer is very leathery and dry. 05/04/2022: The anterior tibial wound is even smaller today. There is no visible tendon at all. He has some slough and eschar accumulation. The medial thigh ulcer continues to be quite dry with a leathery surface. 05/11/2022: The anterior tibial wound continues to contract. There is a little bit of eschar and slough buildup. The granulation tissue is also a bit hypertrophic. The medial thigh ulcer is dry and leathery, once again. 05/25/2022: The anterior tibial wound is quite a bit smaller this week. There is a little bit of eschar and slough. No reaccumulation of hypertrophic granulation tissue. The medial thigh ulcer has slightly improved tissue quality. It is still somewhat fibrotic, but there is at least a light pink color to the tissue. 06/01/2022: Both wounds are smaller. There is hypertrophic granulation tissue on the anterior tibial wound. The quality of the tissue continues to improve on the medial thigh ulcer. 06/08/2022: The anterior tibial wound got a little bit macerated around the edges and the wound measures a bit larger; there is still some hypertrophic granulation tissue present. The medial thigh ulcer is measuring smaller and the quality of tissue continues to improve. There is some slough and eschar present. 06/15/2022: The anterior tibial wound looks substantially better. The periwound skin has improved and the hypertrophic granulation tissue has resolved. There is some slough and eschar on the surface. The medial thigh ulcer is about the same size, but with continued improvement in the tissue quality. Slough and eschar is also present here. 06/22/2022:  The anterior tibial wound is a little bit fibrotic today, but smaller. The medial thigh wound is unchanged in size and also remains somewhat fibrotic as well. 06/29/2022: Both wounds are smaller today. There is a little bit of eschar and slough on both. In particular, the moisture balance of the thigh wound has improved significantly. 07/06/2022: The anterior tibial ulcer is nearly closed with just 2 small open areas remaining with slough accumulation. The medial thigh ulcer is smaller by about 25% and the moisture balance continues to improve. There is some slough and eschar buildup. 07/13/2022: The anterior tibial ulcer looks about the same, perhaps slightly smaller with slough and eschar buildup. The medial thigh ulcer continues to contract and has a very good moisture balance. 07/20/2022: The anterior tibial ulcer is smaller today with just some eschar overlying 2 small openings. The medial thigh ulcer is also smaller today with some dry eschar around the edges and slough on the surface. 07/28/2022: The anterior tibial wound has healed. The medial thigh ulcer is about half the size as it was last week but has dry eschar around the edges and a little bit of slough on the surface. 08/03/2022: The anterior tibial  wound remains closed. The medial thigh ulcer is half again smaller this week. The moisture balance is excellent. 08/10/2022: The medial thigh ulcer is down to just a couple of millimeters underneath a layer of eschar and dried endoform. 08/24/2022: His wound is healed. Patient History Information obtained from Patient. Family History Cancer - Siblings, Heart Disease - Father, Hypertension - Father,Mother, No family history of Hereditary Spherocytosis, Kidney Disease, Seizures, Stroke, Thyroid Problems, Tuberculosis. Social History Former smoker - smokeless tobacco, Marital Status - Married, Alcohol Use - Rarely - WINE, Drug Use - No History, Caffeine Use - Daily - COFFEE. Medical  History Ear/Nose/Mouth/Throat Patient has history of Chronic sinus problems/congestion - seasonal allergies Cardiovascular Patient has history of Hypertension, Peripheral Arterial Disease - STENTS, Peripheral Venous Disease Endocrine Patient has history of Type II Diabetes - last A1c- 8.2 Hospitalization/Surgery History - left shoulder surgery. Medical A Surgical History Notes nd Constitutional Symptoms (General Health) obesity , h/o (R) leg claudication (right fem stent September 2012) Cardiovascular hyperlipidemia Michael [name], Michael [name] ([id number]) [id number] Page 7 of 9 Endocrine pt. on diet intentionally losing 20 pounds since December 2016 in an effort to improve A1C levels Integumentary (Skin) psoriatic arthritis Musculoskeletal bilat knee pain identified as an ortho issue psoriatic arthritis Oncologic Prostate cancer 2018 Psychiatric anxiety Objective Constitutional Slightly hypertensive. Slightly tachycardic. no acute distress. Vitals Time Taken: 8:54 AM, Height: 72 in, Weight: 208 lbs, BMI: 28.2, Temperature: 97.8 F, Pulse: 101 bpm, Respiratory Rate: 20 breaths/min, Blood Pressure: 146/73 mmHg. General Notes: 08/24/2022: Under a thin layer of eschar, his wound is healed. Integumentary (Hair, Skin) Wound #4 status is Healed - Epithelialized. Original cause of wound was Gradually Appeared. The date acquired was: 01/05/2022. The wound has been in treatment 32 weeks. The wound is located on the Right,Medial Upper Leg. The wound measures 0cm length x 0cm width x 0cm depth; 0cm^2 area and 0cm^3 volume. There is no tunneling or undermining noted. There is a none present amount of drainage noted. The wound margin is distinct with the outline attached to the wound base. There is no granulation within the wound bed. There is no necrotic tissue within the wound bed. The periwound skin appearance had no abnormalities noted for moisture. The periwound skin  appearance had no abnormalities noted for color. The periwound skin appearance exhibited: Scarring. Periwound temperature was noted as No Abnormality. 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 Edema Control - Lymphedema / SCD / Other: Elevate legs to the level of the heart or above for 30 minutes daily and/or when sitting for 3-4 times a day throughout the day. Avoid standing for long periods of time. Moisturize legs daily. 08/24/2022: His wound is healed. I did recommend that he keep the area well moisturized and protect both this and his other healed wound site on his anterior tibia protected from trauma for the next couple of weeks as they are freshly healed. We will discharge him from the wound care center. He may follow-up as needed. Electronic Signature(s) Signed: 08/24/2022 9:21:23 AM By: Michael Guess MD FACS Entered By: Michael [name] on 08/24/2022 09:21:23 [name] ([id number]) [id number] Page 8 of 9 -------------------------------------------------------------------------------- HxROS Details Patient Name: Date of Service: Michael [name], [name] 08/24/2022 9:15 A M Medical  Record Number: [id number] Patient Account Number: [id number] Date of Birth/Sex: Treating RN: 10-Mar-1950 (72 y.o. M) Primary Care Provider: Kae [name] Other Clinician: Referring Provider: Treating Provider/Extender: Michael [name] in Treatment: 37 Information Obtained From Patient Constitutional Symptoms (General Health) Medical History: Past Medical History Notes: obesity , h/o (R) leg claudication (right fem stent  September 2012) Ear/Nose/Mouth/Throat Medical History: Positive for: Chronic sinus problems/congestion - seasonal allergies Cardiovascular Medical History: Positive for: Hypertension; Peripheral Arterial Disease - STENTS; Peripheral Venous Disease Past Medical History Notes: hyperlipidemia Endocrine Medical History: Positive for: Type II Diabetes - last A1c- 8.2 Past Medical History Notes: pt. on diet intentionally losing 20 pounds since December 2016 in an effort to improve A1C levels Time with diabetes: 9 YRS Treated with: Insulin Blood sugar tested every day: Yes Tested : 7-8 TIMES Integumentary (Skin) Medical History: Past Medical History Notes: psoriatic arthritis Musculoskeletal Medical History: Past Medical History Notes: bilat knee pain identified as an ortho issue psoriatic arthritis Oncologic Medical History: Past Medical History Notes: Prostate cancer 2018 Psychiatric Medical History: Past Medical History Notes: anxiety HBO Extended History Items Ear/Nose/Mouth/Throat: Chronic sinus problems/congestion Immunizations Michael [name], Michael [name] ([id number]) [phone].pdf Page 9 of 9 Pneumococcal Vaccine: Received Pneumococcal Vaccination: Yes Received Pneumococcal Vaccination On or After 60th Birthday: Yes Implantable Devices None Hospitalization / Surgery History Type of Hospitalization/Surgery left shoulder surgery Family and Social History Cancer: Yes - Siblings; Heart Disease: Yes - Father; Hereditary Spherocytosis: No; Hypertension: Yes - Father,Mother; Kidney Disease: No; Seizures: No; Stroke: No; Thyroid Problems: No; Tuberculosis: No; Former smoker - smokeless tobacco; Marital Status - Married; Alcohol Use: Rarely - WINE; Drug Use: No History; Caffeine Use: Daily - COFFEE; Financial Concerns: No; Food, Clothing or Shelter Needs: No; Support System Lacking: No; Transportation Concerns: No Electronic Signature(s) Signed: 08/24/2022  9:35:48 AM By: Michael Guess MD FACS Entered By: Michael [name] on 08/24/2022 09:18:52 -------------------------------------------------------------------------------- SuperBill Details Patient Name: Date of Service: Michael [name], Michael LTER C. 08/24/2022 Medical Record Number: [id number] Patient Account Number: [id number] Date of Birth/Sex: Treating RN: Sep 14, 1950 (72 y.o. 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 E11.622 Type 2 diabetes mellitus with other skin ulcer I73.9 Peripheral vascular disease, unspecified Facility Procedures : CPT4 Code: [id number] Description: 63016 - WOUND CARE VISIT-LEV 2 EST PT Modifier: Quantity: 1 Physician Procedures : CPT4 Code Description Modifier [id number] - WC PHYS LEVEL 3 - EST PT ICD-10 Diagnosis Description L97.122 Non-pressure chronic ulcer of left thigh with fat layer exposed I70.239 Atherosclerosis of native arteries of right leg with ulceration of  unspecified site E11.622 Type 2 diabetes mellitus with other skin ulcer I73.9 Peripheral vascular disease, unspecified Quantity: 1 Electronic Signature(s) Signed: 08/24/2022 9:21:37 AM By: Michael Guess MD FACS Entered By: Michael [name] on 08/24/2022 09:21:37

## 2022-08-24 NOTE — Progress Notes (Signed)
Michael [name], Michael [name] ([id number]) [id number] Page 1 of 7 Visit Report for 08/24/2022 Arrival Information Details Patient Name: Date of Service: Michael [name] Michael [name], [name] 08/24/2022 9:15 A M Medical Record Number: [id number] Patient Account Number: [id number] Date of Birth/Sex: Treating RN: [name] (72 y.o. M) Primary Care Michael [name]: Michael [name] Other Clinician: Referring Michael [name]: Treating Michael [name]/Extender: Michael [name] in Treatment: 37 Visit Information History Since Last Visit All ordered tests and consults were completed: No Patient Arrived: Ambulatory Added or deleted any medications: No Arrival Time: 08:54 Any new allergies or adverse reactions: No Accompanied By: self Had a fall or experienced change in No Transfer Assistance: None activities of daily living that may affect Patient Identification Verified: Yes risk of falls: Secondary Verification Process Completed: Yes Signs or symptoms of abuse/neglect since last visito No Patient Requires Transmission-Based Precautions: No Hospitalized since last visit: No Patient Has Alerts: Yes Implantable device outside of the clinic excluding No Patient Alerts: Patient on Blood Thinner cellular tissue based products placed in the center plavix since last visit: Pain Present Now: No Electronic Signature(s) Signed: 08/24/2022 11:29:49 AM By: Michael [name] Entered By: Michael [name] on 08/24/2022 08:54:45 -------------------------------------------------------------------------------- Clinic Level of Care Assessment Details Patient Name: Date of Service: Michael [name], [name] 08/24/2022 9:15 A M Medical Record Number: [id number] Patient Account Number: [id number] Date of Birth/Sex: Treating RN: Michael [name] (72 y.o. Michael [name] Primary Care Michael [name]: Michael [name] Other Clinician: Referring Michael [name]: Treating Michael [name]/Extender: Michael [name] in Treatment:  37 Clinic Level of Care Assessment Items TOOL 4 Quantity Score []  - 0 Use when only an EandM is performed on FOLLOW-UP visit ASSESSMENTS - Nursing Assessment / Reassessment []  - 0 Reassessment of Co-morbidities (includes updates in patient status) X- 1 5 Reassessment of Adherence to Treatment Plan ASSESSMENTS - Wound and Skin A ssessment / Reassessment X - Simple Wound Assessment / Reassessment - one wound 1 5 []  - 0 Complex Wound Assessment / Reassessment - multiple wounds []  - 0 Dermatologic / Skin Assessment (not related to wound area) ASSESSMENTS - Focused Assessment []  - 0 Circumferential Edema Measurements - multi extremities []  - 0 Nutritional Assessment / Counseling / Intervention Michael [name], Michael [name] ([id number]) [id number] Page 2 of 7 []  - 0 Lower Extremity Assessment (monofilament, tuning fork, pulses) []  - 0 Peripheral Arterial Disease Assessment (using hand held doppler) ASSESSMENTS - Ostomy and/or Continence Assessment and Care []  - 0 Incontinence Assessment and Management []  - 0 Ostomy Care Assessment and Management (repouching, etc.) PROCESS - Coordination of Care X - Simple Patient / Family Education for ongoing care 1 15 []  - 0 Complex (extensive) Patient / Family Education for ongoing care X- 1 10 Staff obtains Chiropractor, Records, T Results / Process Orders est []  - 0 Staff telephones HHA, Nursing Homes / Clarify orders / etc []  - 0 Routine Transfer to another Facility (non-emergent condition) []  - 0 Routine Hospital Admission (non-emergent condition) []  - 0 New Admissions / Manufacturing engineer / Ordering NPWT Apligraf, etc. , []  - 0 Emergency Hospital Admission (emergent condition) X- 1 10 Simple Discharge Coordination []  - 0 Complex (extensive) Discharge Coordination PROCESS - Special Needs []  - 0 Pediatric / Minor Patient Management []  - 0 Isolation Patient Management []  - 0 Hearing / Language / Visual special  needs []  - 0 Assessment of Community assistance (transportation, D/[name] planning, etc.) []  - 0 Additional assistance / Altered mentation []  - 0 Support Surface(s) Assessment (bed, cushion, seat, etc.) INTERVENTIONS -  Wound Cleansing / Measurement []  - 0 Simple Wound Cleansing - one wound []  - 0 Complex Wound Cleansing - multiple wounds X- 1 5 Wound Imaging (photographs - any number of wounds) []  - 0 Wound Tracing (instead of photographs) []  - 0 Simple Wound Measurement - one wound []  - 0 Complex Wound Measurement - multiple wounds INTERVENTIONS - Wound Dressings []  - 0 Small Wound Dressing one or multiple wounds []  - 0 Medium Wound Dressing one or multiple wounds []  - 0 Large Wound Dressing one or multiple wounds []  - 0 Application of Medications - topical []  - 0 Application of Medications - injection INTERVENTIONS - Miscellaneous []  - 0 External ear exam []  - 0 Specimen Collection (cultures, biopsies, blood, body fluids, etc.) []  - 0 Specimen(s) / Culture(s) sent or taken to Lab for analysis []  - 0 Patient Transfer (multiple staff / Nurse, adult / Similar devices) []  - 0 Simple Staple / Suture removal (25 or less) []  - 0 Complex Staple / Suture removal (26 or more) []  - 0 Hypo / Hyperglycemic Management (close monitor of Blood Glucose) Michael [name], Michael [name] ([id number]) [id number] Page 3 of 7 []  - 0 Ankle / Brachial Index (ABI) - do not check if billed separately X- 1 5 Vital Signs Has the patient been seen at the hospital within the last three years: Yes Total Score: 55 Level Of Care: New/Established - Level 2 Electronic Signature(s) Signed: 08/24/2022 4:36:32 PM By: Samuella [name] Entered By: Samuella [name] on 08/24/2022 09:08:23 -------------------------------------------------------------------------------- Encounter Discharge Information Details Patient Name: Date of Service: Michael [name], [name] 08/24/2022 9:15 A M Medical  Record Number: [id number] Patient Account Number: [id number] Date of Birth/Sex: Treating RN: [birth year] [name] (72 y.o. Michael [name] Primary Care Mabelle Mungin: Michael [name] Other Clinician: Referring Lacy Sofia: Treating Kendrik Mcshan/Extender: Michael [name] in Treatment: [phone] Encounter Discharge Information Items Discharge Condition: Stable Ambulatory Status: Ambulatory Discharge Destination: Home Transportation: Private Auto Accompanied By: self Schedule Follow-up Appointment: No Clinical Summary of Care: Patient Declined Electronic Signature(s) Signed: 08/24/2022 4:36:32 PM By: Samuella [name] Entered By: Samuella [name] on 08/24/2022 09:08:58 -------------------------------------------------------------------------------- Lower Extremity Assessment Details Patient Name: Date of Service: Michael [name], [name] 08/24/2022 9:15 A M Medical Record Number: [id number] Patient Account Number: [id number] Date of Birth/Sex: Treating RN: [name] (72 y.o. Michael [name] Primary Care Makaelah Cranfield: Michael [name] Other Clinician: Referring Finnigan Warriner: Treating Ulric Salzman/Extender: Michael [name] in Treatment: 37 Edema Assessment Assessed: Michael [name]: No] Franne Forts: No] Edema: [Left: [name]] [Right: o] Calf Left: Right: Point of Measurement: 35 cm From Medial Instep 35 cm Ankle Left: Right: Point of Measurement: 12 cm From Medial Instep 20.1 cm Electronic Signature(s) Signed: 08/24/2022 4:36:32 PM By: Tessie Eke ([id number]) BySteele [email or web address].pdf Page 4 of 7 Signed: 08/24/2022 4:36:32 PM aylor Entered By: Samuella [name] on 08/24/2022 08:58:05 -------------------------------------------------------------------------------- Multi Wound Chart Details Patient Name: Date of Service: Michael [name] [place], [name] 08/24/2022 9:15 A M Medical Record Number: [id number] Patient Account Number: [id number] Date  of Birth/Sex: Treating RN: [name][birth year] (72 y.o. M) Primary Care Zenola Dezarn: Michael [name] Other Clinician: Referring Donavyn Fecher: Treating Falicity Sheets/Extender: Michael [name] in Treatment: 37 Vital Signs Height(in): 72 Pulse(bpm): 101 Weight(lbs): 208 Blood Pressure(mmHg): 146/73 Body Mass Index(BMI): 28.2 Temperature(F): 97.8 Respiratory Rate(breaths/min): 20 [4:Photos: No Photos Right, Medial Upper Leg Wound Location: Gradually Appeared Wounding Event: Dehisced Wound Primary Etiology: Chronic sinus problems/congestion, Comorbid History: Hypertension, Peripheral Arterial Disease, Peripheral Venous Disease, Type II  Diabetes 01/05/2022 Date Acquired: 32 Weeks of Treatment: Healed - Epithelialized Wound Status: No Wound Recurrence: 0x0x0 Measurements L x W x D (cm) 0 A (cm) : rea 0 Volume (cm) : Full Thickness Without Exposed Classification: Support Structures None  Present Exudate Amount: Distinct, outline attached Wound Margin: None Present (0%) Granulation Amount: None Present (0%) Necrotic Amount: Fascia: No Exposed Structures: Fat Layer (Subcutaneous Tissue): No Tendon: No Muscle: No Joint: No Bone: No Large  (67-100%) Epithelialization: Scarring: Yes Periwound Skin Texture: No Abnormalities Noted Periwound Skin Moisture: No Abnormalities Noted Periwound Skin Color: No Abnormality Temperature:] [[name]/A:[name]/A [name]/A [name]/A [name]/A [name]/A [name]/A [name]/A [name]/A [name]/A [name]/A [name]/A [name]/A [name]/A [name]/A [name]/A  [name]/A [name]/A [name]/A [name]/A [name]/A [name]/A [name]/A [name]/A] Treatment Notes Wound #4 (Upper Leg) Wound Laterality: Right, Medial Cleanser Peri-Wound Care Topical Primary Dressing Secondary Dressing Secured With Compression Wrap Compression Stockings Michael [name], Michael [name] ([id number]) [id number] Page 5 of 7 Add-Ons Electronic Signature(s) Signed: 08/24/2022 9:18:24 AM By: Duanne Guess MD FACS Entered By: Duanne Guess on 08/24/2022  09:18:24 -------------------------------------------------------------------------------- Multi-Disciplinary Care Plan Details Patient Name: Date of Service: Michael [name], [name] 08/24/2022 9:15 A M Medical Record Number: [id number] Patient Account Number: [id number] Date of Birth/Sex: Treating RN: [birth year] (73 y.o. Michael [name] Primary Care Kimm Ungaro: Michael [name] Other Clinician: Referring Sonna Lipsky: Treating Tobenna Needs/Extender: Michael [name] in Treatment: 37 Active Inactive Electronic Signature(s) Signed: 08/24/2022 4:36:32 PM By: Samuella [name] Entered By: Samuella [name] on 08/24/2022 09:07:06 -------------------------------------------------------------------------------- Pain Assessment Details Patient Name: Date of Service: Michael [name], [name] 08/24/2022 9:15 A M Medical Record Number: [id number] Patient Account Number: [id number] Date of Birth/Sex: Treating RN: [birth year] (72 y.o. M) Primary Care Silverio Hagan: Michael [name] Other Clinician: Referring [place] Grayer: Treating Zakar Brosch/Extender: Michael [name] in Treatment: 37 Active Problems Location of Pain Severity and Description of Pain Patient Has Paino No Site Locations Pain Management and Medication Current Pain Management: Michael [name], Michael [name] ([id number]) [phone] Page 6 of 7 Electronic Signature(s) Signed: 08/24/2022 11:29:49 AM By: Michael [name] Entered By: Michael [name] on 08/24/2022 08:55:14 -------------------------------------------------------------------------------- Patient/Caregiver Education Details Patient Name: Date of Service: Michael [name], [name] 6/24/2024andnbsp9:15 A M Medical Record Number: [id number] Patient Account Number: [id number] Date of Birth/Gender: Treating RN: [name][birth year] (72 y.o. Michael [name] Primary Care Physician: Michael [name] Other Clinician: Referring Physician: Treating Physician/Extender:  Michael [name] in Treatment: 37 Education Assessment Education Provided To: Patient Education Topics Provided Wound/Skin Impairment: Methods: Explain/Verbal Responses: Reinforcements needed, State content correctly Electronic Signature(s) Signed: 08/24/2022 4:36:32 PM By: Samuella [name] Entered By: Samuella [name] on 08/24/2022 09:07:56 -------------------------------------------------------------------------------- Wound Assessment Details Patient Name: Date of Service: Michael [name], [name] 08/24/2022 9:15 A M Medical Record Number: [id number] Patient Account Number: [id number] Date of Birth/Sex: Treating RN: [birth year][name] (72 y.o. Michael [name] Primary Care Torrence Hammack: Michael [name] Other Clinician: Referring Amaiyah Nordhoff: Treating Ednamae Schiano/Extender: Michael [name] in Treatment: 37 Wound Status Wound Number: 4 Primary Dehisced Wound Etiology: Wound Location: Right, Medial Upper Leg Wound Healed - Epithelialized Wounding Event: Gradually Appeared Status: Date Acquired: 01/05/2022 Comorbid Chronic sinus problems/congestion, Hypertension, Peripheral Weeks Of Treatment: 32 History: Arterial Disease, Peripheral Venous Disease, Type II Diabetes Clustered Wound: No Wound Measurements Length: (cm) Width: (cm) Depth: (cm) Area: (cm) Volume: (cm) 0 % Reduction in Area: 0 % Reduction in Volume: 0 Epithelialization: Large (67-100%) 0 Tunneling: No 0 Undermining: No Wound Description Michael [name], Michael [name] ([id number]) Classification: Full Thickness Without Exposed Support Structures  Wound Margin: Distinct, outline attached Exudate Amount: None Present [phone] Page 7 of 7 Foul Odor After Cleansing: No Slough/Fibrino No Wound Bed Granulation Amount: None Present (0%) Exposed Structure Necrotic Amount: None Present (0%) Fascia Exposed: No Fat Layer (Subcutaneous Tissue) Exposed: No Tendon Exposed:  No Muscle Exposed: No Joint Exposed: No Bone Exposed: No Periwound Skin Texture Texture Color No Abnormalities Noted: No No Abnormalities Noted: Yes Scarring: Yes Temperature / Pain Temperature: No Abnormality Moisture No Abnormalities Noted: Yes Treatment Notes Wound #4 (Upper Leg) Wound Laterality: Right, Medial Cleanser Peri-Wound Care Topical Primary Dressing Secondary Dressing Secured With Compression Wrap Compression Stockings Add-Ons Electronic Signature(s) Signed: 08/24/2022 4:36:32 PM By: Samuella [name] Entered By: Samuella [name] on 08/24/2022 09:00:37 -------------------------------------------------------------------------------- Vitals Details Patient Name: Date of Service: Michael [name], [name] 08/24/2022 9:15 A M Medical Record Number: [id number] Patient Account Number: [id number] Date of Birth/Sex: Treating RN: [birth year]-[name] (72 y.o. M) Primary Care Jeannemarie Sawaya: Michael [name] Other Clinician: Referring Piero Mustard: Treating Lyonel Morejon/Extender: Michael [name] in Treatment: 37 Vital Signs Time Taken: 08:54 Temperature (F): 97.8 Height (in): 72 Pulse (bpm): 101 Weight (lbs): 208 Respiratory Rate (breaths/min): 20 Body Mass Index (BMI): 28.2 Blood Pressure (mmHg): 146/73 Reference Range: 80 - 120 mg / dl Electronic Signature(s) Signed: 08/24/2022 11:29:49 AM By: Michael [name] Entered By: Michael [name] on 08/24/2022 08:55:06

## 2022-08-25 DIAGNOSIS — D225 Melanocytic nevi of trunk: Secondary | ICD-10-CM | POA: Diagnosis not present

## 2022-08-25 DIAGNOSIS — D485 Neoplasm of uncertain behavior of skin: Secondary | ICD-10-CM | POA: Diagnosis not present

## 2022-08-25 DIAGNOSIS — Z1283 Encounter for screening for malignant neoplasm of skin: Secondary | ICD-10-CM | POA: Diagnosis not present

## 2022-08-25 DIAGNOSIS — L400 Psoriasis vulgaris: Secondary | ICD-10-CM | POA: Diagnosis not present

## 2022-08-27 DIAGNOSIS — L400 Psoriasis vulgaris: Secondary | ICD-10-CM | POA: Diagnosis not present

## 2022-08-27 DIAGNOSIS — D649 Anemia, unspecified: Secondary | ICD-10-CM | POA: Diagnosis not present

## 2022-08-27 DIAGNOSIS — Z79899 Other long term (current) drug therapy: Secondary | ICD-10-CM | POA: Diagnosis not present

## 2022-08-27 DIAGNOSIS — L4050 Arthropathic psoriasis, unspecified: Secondary | ICD-10-CM | POA: Diagnosis not present

## 2022-08-28 DIAGNOSIS — E1165 Type 2 diabetes mellitus with hyperglycemia: Secondary | ICD-10-CM | POA: Diagnosis not present

## 2022-08-28 DIAGNOSIS — D649 Anemia, unspecified: Secondary | ICD-10-CM | POA: Diagnosis not present

## 2022-09-01 DIAGNOSIS — Z794 Long term (current) use of insulin: Secondary | ICD-10-CM | POA: Diagnosis not present

## 2022-09-01 DIAGNOSIS — Z8546 Personal history of malignant neoplasm of prostate: Secondary | ICD-10-CM | POA: Diagnosis not present

## 2022-09-01 DIAGNOSIS — E1151 Type 2 diabetes mellitus with diabetic peripheral angiopathy without gangrene: Secondary | ICD-10-CM | POA: Diagnosis not present

## 2022-09-01 DIAGNOSIS — D649 Anemia, unspecified: Secondary | ICD-10-CM | POA: Diagnosis not present

## 2022-09-02 DIAGNOSIS — D649 Anemia, unspecified: Secondary | ICD-10-CM | POA: Diagnosis not present

## 2022-09-02 DIAGNOSIS — E1151 Type 2 diabetes mellitus with diabetic peripheral angiopathy without gangrene: Secondary | ICD-10-CM | POA: Diagnosis not present

## 2022-09-02 DIAGNOSIS — E538 Deficiency of other specified B group vitamins: Secondary | ICD-10-CM | POA: Diagnosis not present

## 2022-09-10 NOTE — Progress Notes (Signed)
[facility] [address], [place] [address]   Clinic Day:  09/11/22   Referring physician: Genia Hotter, FNP  Patient Care Team: Stamey, Verda Cumins, FNP as PCP - General (Family Medicine) Corky Crafts, MD as PCP - Cardiology (Cardiology) Corky Crafts, MD as Attending Physician (Cardiology) Ernesto Rutherford, MD as Consulting Physician (Ophthalmology) Talmage Coin, MD as Consulting Physician (Endocrinology) Doreatha Massed, MD as Medical Oncologist (Hematology)   ASSESSMENT & PLAN:   Assessment:  1.  Normocytic anemia: - Labs at [facility] on 08/28/2022: Hb-9.6, MCV-88, WBC-5.1, PLT-347, creatinine-1.02, calcium 10.6.  Hemoglobin was 8.5 few weeks prior to that.  He started taking iron tablet 2 weeks ago.  Reports constipation. - Reports fatigue for 5 years. - Last colonoscopy 3 years ago.  Repeat colonoscopy planned on 09/25/2022. - Stool for occult blood checked -3 weeks ago at [facility] physicians.  2.  Social/family history: - Lives at home with his wife.  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  Non-smoker.  Chewed tobacco for few years and quit in October 12, 1975. - Sister died of Hodgkin's disease at age 83 in 57.  23.  T2a prostate cancer: - Gleason 3+4=7, PSA 7.54. - XRT 70 Gray in 28 fractions from 04/11/2018 - 05/18/2018 - PSA less than 0.1  Plan:  1.  Normocytic anemia: - Likely iron deficiency state contributing to anemia. - Will repeat CBC, check ferritin, iron panel and also check for other nutritional deficiencies.  Will check for bone marrow infiltrative process and hemolysis. - He has mild hypercalcemia on labs from 08/28/2022.  Will repeat calcium level. - If he is tolerating oral iron therapy reasonably well, he may continue iron tablets and repeat his hemoglobin 2 months.  If not we will consider parenteral iron therapy for quick improvement in energy levels.   Orders Placed This Encounter  Procedures   CBC  with Differential    Standing Status:   Future    Number of Occurrences:   1    Standing Expiration Date:   09/11/2023   Reticulocytes    Standing Status:   Future    Number of Occurrences:   1    Standing Expiration Date:   09/11/2023   Lactate dehydrogenase    Standing Status:   Future    Number of Occurrences:   1    Standing Expiration Date:   09/11/2023   Ferritin    Standing Status:   Future    Number of Occurrences:   1    Standing Expiration Date:   09/11/2023   Iron and TIBC ([facility] ONLY)    Standing Status:   Future    Number of Occurrences:   1    Standing Expiration Date:   09/11/2023   Vitamin B12    Standing Status:   Future    Number of Occurrences:   1    Standing Expiration Date:   09/11/2023   Folate    Standing Status:   Future    Number of Occurrences:   1    Standing Expiration Date:   09/11/2023   Methylmalonic acid, serum    Standing Status:   Future    Number of Occurrences:   1    Standing Expiration Date:   09/11/2023   Copper, serum    Standing Status:   Future    Number of Occurrences:   1    Standing Expiration Date:   09/11/2023  Protein electrophoresis, serum    Standing Status:   Future    Number of Occurrences:   1    Standing Expiration Date:   09/11/2023   Kappa/lambda light chains    Standing Status:   Future    Number of Occurrences:   1    Standing Expiration Date:   09/11/2023   Immunofixation electrophoresis    Standing Status:   Future    Number of Occurrences:   1    Standing Expiration Date:   09/11/2023   Calcium    Standing Status:   Future    Number of Occurrences:   1    Standing Expiration Date:   09/11/2023      Alben Deeds Teague,acting as a scribe for Doreatha Massed, MD.,have documented all relevant documentation on the behalf of Doreatha Massed, MD,as directed by  Doreatha Massed, MD while in the presence of Doreatha Massed, MD.   I, Doreatha Massed MD, have reviewed the above  documentation for accuracy and completeness, and I agree with the above.   Doreatha Massed, MD   7/12/20244:30 PM  CHIEF COMPLAINT/PURPOSE OF CONSULT:   Diagnosis: anemia  Current Therapy:  iron pills  HISTORY OF PRESENT ILLNESS:   Michael [name] is a 72 y.o. male presenting to clinic today for evaluation of anemia at the request of Stamey, Verda Cumins, FNP.  Patient had a recent CBC on 6/21 that showed a hemoglobin of 8.7, RBC at 3.12, hematocrit at 29.0%, MCHC at 30.1, and elevated RDW at 22.9%. An FOBT was ordered on 6/28 and results are pending. Of note, he had prostate cancer 5 years ago and is currently in remission.   Today, he states that he is doing well overall. His appetite level is at 85%. His energy level is at 50%.  Patient notes his blood sugar is dropping at this visit. He reports his CBC 5-6 weeks ago showed hemoglobin at 9.6.   He notes he has had radiation for prostate cancer in 2019, which has lowered his testosterone levels. He denies being given Lupron shots with radiation. He notes he saw Dr. Kathrynn Running at [facility] for [facility]. He has an annual visit with urologist.  He notes chronic fatigue over the last 5 years. He notes DVT in his right leg that has been taken out.   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 for 30 years. He denies ever having smoking. He does note that he used to chew tobacco for a few years but quit in 1977. He denies drinking alcohol. He reports his sister died of Hodgkinsons disease at age 55 and prostate cancer. He notes his grandfather died of PAD at age 20 and both grandfathers had PAD.   He denies any blood transfusions, he does notes he had an IV bag to treat psoriasis which resolved. He currently has psoriasis on his lower back and is currently taking a cream. He denies craving ice. He started taking iron pills 2 weeks ago, which has caused mild constipation, and has started eating liver.    He c/o fatigue and mild SOB when bringing the trash in. He has not noticed any recent bleeding such as epistaxis, hematuria or hematochezia. His last colonoscopy was 3 years ago. He has one scheduled on 7/26 with [facility]. He denies any kidney problems.   He reports he has Type 2 DM and high blood sugar that . He notes he has lowered lancet to 16mg .  He reports taking Vitamin D 1000mg  1x daily, and Vitamin A.  He reports easily bruising.   PAST MEDICAL HISTORY:   Past Medical History: Past Medical History:  Diagnosis Date   Allergy    Anxiety    Arthritis    psoriatic arthritis   Diabetes mellitus    GERD (gastroesophageal reflux disease)    Hyperlipidemia    Hypertension    PAD (peripheral artery disease) (HCC)    a. s/p prior LE stenting 2012, 03/2017 - followed by [facility].   Prostate cancer (HCC)    Sleep apnea 2016   wears CPAP sometimes    Surgical History: Past Surgical History:  Procedure Laterality Date   ABDOMINAL AORTOGRAM N/A 09/01/2016   Procedure: Abdominal Aortogram;  Surgeon: Nada Libman, MD;  [facility];  Service: Cardiovascular;  Laterality: N/A;   ABDOMINAL AORTOGRAM W/LOWER EXTREMITY N/A 03/16/2017   Procedure: ABDOMINAL AORTOGRAM W/LOWER EXTREMITY;  Surgeon: Nada Libman, MD;  [facility]: [facility];  Service: Cardiovascular;  Laterality: N/A;   ANGIOPLASTY Right 01/30/2021   Procedure: BALLOON ANGIOPLASTY;  Surgeon: Nada Libman, MD;  Location: [facility] OR;  Service: Vascular;  Laterality: Right;   ANGIOPLASTY Right 10/15/2021   Procedure: RIGHT SUPERFICIAL FEMORAL ARTERY BALLOON ANGIOPLASTY;  Surgeon: Nada Libman, MD;  Location: [facility];  Service: Vascular;  Laterality: Right;   ANGIOPLASTY / STENTING FEMORAL  11/05/2010   Left SFA stent   AORTOGRAM N/A 10/15/2021   Procedure: ABDOMINAL AORTOGRAM;  Surgeon: Nada Libman, MD;  [facility]: [facility];  Service: Vascular;  Laterality: N/A;   APPLICATION OF WOUND VAC Right  04/16/2021   Procedure: APPLICATION OF WOUND VAC;  Surgeon: Nada Libman, MD;  [facility]: [facility];  Service: Vascular;  Laterality: Right;   APPLICATION OF WOUND VAC Right 05/02/2021   Procedure: APPLICATION OF WOUND VAC;  Surgeon: Chuck Hint, MD;  Location: [facility] OR;  Service: Vascular;  Laterality: Right;   APPLICATION OF WOUND VAC Right 05/05/2021   Procedure: WOUND VAC CHANGE RIGHT GROIN, APPLICATION OF WOUND VAC PROXIMAL AND DISTAL MEDIAL RIGHT THIGH;  Surgeon: Leonie Douglas, MD;  [facility];  Service: Vascular;  Laterality: Right;   ENDARTERECTOMY FEMORAL Right 04/16/2021   Procedure: RIGHT FEMORAL ENDARTERECTOMY;  Surgeon: Nada Libman, MD;  [facility];  Service: Vascular;  Laterality: Right;   EYE SURGERY  2010   bilateral cataract   FEMORAL-POPLITEAL BYPASS GRAFT Right 04/16/2021   Procedure: RIGHT FEMORAL-POPLITEAL BYPASS USING VEIN;  Surgeon: Nada Libman, MD;  [facility];  Service: Vascular;  Laterality: Right;   GROIN DEBRIDEMENT Right 05/05/2021   Procedure: IRRIGATION AND DEBRIDEMENT RIGHT GROIN AND RIGHT THIGH;  Surgeon: Leonie Douglas, MD;  [facility];  Service: Vascular;  Laterality: Right;   I & D EXTREMITY Right 05/02/2021   Procedure: IRRIGATION AND DEBRIDEMENT RIGHT LEG;  Surgeon: Chuck Hint, MD;  Location: [facility] OR;  Service: Vascular;  Laterality: Right;   I & D EXTREMITY Right 10/15/2021   Procedure: IRRIGATION AND DEBRIDEMENT RIGHT LEG;  Surgeon: Nada Libman, MD;  [facility];  Service: Vascular;  Laterality: Right;   KNEE ARTHROSCOPY     LOWER EXTREMITY ANGIOGRAM Right 01/30/2021   Procedure: LOWER EXTREMITY ANGIOGRAM WITH ARTERIOGRAM OF RIGHT LOWER EXTREMITY;  Surgeon: Nada Libman, MD;  [facility];  Service: Vascular;  Laterality: Right;   LOWER EXTREMITY ANGIOGRAPHY N/A 09/01/2016   Procedure: Lower Extremity Angiography;  Surgeon: Nada Libman, MD;  [facility];  Service:  Cardiovascular;  Laterality: N/A;   PERIPHERAL VASCULAR ATHERECTOMY Right 09/01/2016   Procedure: Peripheral Vascular Atherectomy;  Surgeon: Nada Libman, MD;  [facility];  Service: Cardiovascular;  Laterality: Right;  superficial femoral   PERIPHERAL VASCULAR INTERVENTION Right 03/16/2017   Procedure: PERIPHERAL VASCULAR INTERVENTION;  Surgeon: Nada Libman, MD;  [facility];  Service: Cardiovascular;  Laterality: Right;  superficicial femoral   ROTATOR CUFF REPAIR     1990   ROTATOR CUFF REPAIR Bilateral 2017   ULTRASOUND GUIDANCE FOR VASCULAR ACCESS Left 01/30/2021   Procedure: ULTRASOUND GUIDANCE FOR VASCULAR ACCESS;  Surgeon: Nada Libman, MD;  [facility];  Service: Vascular;  Laterality: Left;   ULTRASOUND GUIDANCE FOR VASCULAR ACCESS Left 10/15/2021   Procedure: ULTRASOUND GUIDANCE FOR VASCULAR ACCESS;  Surgeon: Nada Libman, MD;  [facility];  Service: Vascular;  Laterality: Left;   VEIN HARVEST Right 04/16/2021   Procedure: RIGHT GREATER SAPHENOUS VEIN HARVEST;  Surgeon: Nada Libman, MD;  [facility];  Service: Vascular;  Laterality: Right;    Social History: Social History   Socioeconomic History   Marital status: Married    Spouse name: Not on file   Number of children: Not on file   Years of education: Not on file   Highest education level: Not on file  Occupational History   Not on file  Tobacco Use   Smoking status: Never    Passive exposure: Never   Smokeless tobacco: Former    Quit date: 10/26/1981  Vaping Use   Vaping status: Never Used  Substance and Sexual Activity   Alcohol use: Yes    Comment: occasional drink, maybe a glass of wine once every 6 months   Drug use: No   Sexual activity: Not on file  Other Topics Concern   Not on file  Social History Narrative   Not on file   Social Determinants of Health   Financial Resource Strain: Not on file  Food Insecurity: Not on file   Transportation Needs: Not on file  Physical Activity: Not on file  Stress: Not on file  Social Connections: Not on file  Intimate Partner Violence: Not on file    Family History: Family History  Problem Relation Age of Onset   Hyperlipidemia Mother    Hypertension Mother    Heart disease Father    Deep vein thrombosis Father    Hyperlipidemia Father    Hypertension Father    Heart attack Father    Peripheral vascular disease Father    Cancer Sister     Current Medications:  Current Outpatient Medications:    ACCU-CHEK AVIVA PLUS test strip, AS DIRECTED SEVEN TIMES A DAY IN VITRO 90 DAYS, Disp: , Rfl: 4   acetaminophen (TYLENOL) 500 MG tablet, Take 1,000 mg by mouth every 6 (six) hours as needed for moderate pain., Disp: , Rfl:    aspirin EC 81 MG tablet, Take 1 tablet (81 mg total) by mouth daily., Disp: , Rfl:    atorvastatin (LIPITOR) 40 MG tablet, Take 40 mg by mouth at bedtime., Disp: , Rfl: 5   benazepril (LOTENSIN) 40 MG tablet, Take 40 mg by mouth daily., Disp: , Rfl:    cefadroxil (DURICEF) 500 MG capsule, Take 2 capsules (1,000 mg total) by mouth 2 (two) times daily., Disp: 56 capsule, Rfl: 0   clonazePAM (KLONOPIN) 1 MG  tablet, Take 2 mg by mouth at bedtime as needed (sleep)., Disp: , Rfl:    clopidogrel (PLAVIX) 75 MG tablet, TAKE 1 TABLET BY MOUTH EVERY DAY WITH BREAKFAST, Disp: 90 tablet, Rfl: 3   Continuous Blood Gluc Receiver (FREESTYLE LIBRE 14 DAY READER) DEVI, use to monitor blood sugar, Disp: , Rfl:    dapagliflozin propanediol (FARXIGA) 5 MG TABS tablet, Take 5 mg by mouth daily., Disp: , Rfl:    diphenhydrAMINE (BENADRYL) 25 MG tablet, Take 25 mg by mouth at bedtime as needed for sleep., Disp: , Rfl:    HUMALOG KWIKPEN 100 UNIT/ML KwikPen, Inject 30 Units into the skin 2 (two) times daily., Disp: , Rfl:    hydrochlorothiazide (HYDRODIURIL) 25 MG tablet, Take 1 tablet (25 mg total) by mouth daily. Please make overdue appt with Dr. Eldridge Dace before anymore  refills. 2nd attempt, Disp: 15 tablet, Rfl: 0   insulin glargine (LANTUS) 100 UNIT/ML injection, Inject 20 Units into the skin at bedtime., Disp: , Rfl:    metFORMIN (GLUCOPHAGE-XR) 500 MG 24 hr tablet, Take 2,000 mg by mouth at bedtime., Disp: , Rfl:    solifenacin (VESICARE) 5 MG tablet, Take 5 mg by mouth every evening., Disp: , Rfl:    tamsulosin (FLOMAX) 0.4 MG CAPS capsule, Take 0.4 mg by mouth at bedtime., Disp: , Rfl: 5   traZODone (DESYREL) 50 MG tablet, Take 50 mg by mouth at bedtime as needed for sleep., Disp: , Rfl:    trolamine salicylate (ASPERCREME) 10 % cream, Apply 1 Application topically as needed for muscle pain., Disp: , Rfl:    Allergies: Allergies  Allergen Reactions   Demerol Anaphylaxis   Lactose Intolerance (Gi) Anaphylaxis   Oxycodone Anxiety and Other (See Comments)    Suicidal ideation   Penicillins Anaphylaxis and Other (See Comments)    Tolerates cefepime    Actos [Pioglitazone] Nausea Only   Exenatide Nausea Only and Other (See Comments)   Glimepiride Other (See Comments)    Edema in ankles    Nabumetone Nausea Only and Other (See Comments)   Victoza [Liraglutide] Nausea Only    Shaking   Fentanyl Anxiety    Jittery    Hydrocodone Anxiety    Suicidal ideation    REVIEW OF SYSTEMS:   Review of Systems  Constitutional:  Positive for fatigue. Negative for chills and fever.  HENT:   Negative for lump/mass, mouth sores, nosebleeds, sore throat and trouble swallowing.   Eyes:  Negative for eye problems.  Respiratory:  Positive for shortness of breath (with exertion). Negative for cough.   Cardiovascular:  Positive for leg swelling. Negative for chest pain and palpitations.  Gastrointestinal:  Positive for constipation. Negative for abdominal pain, diarrhea, nausea and vomiting.  Genitourinary:  Negative for bladder incontinence, difficulty urinating, dysuria, frequency, hematuria and nocturia.   Musculoskeletal:  Negative for arthralgias, back pain,  flank pain, myalgias and neck pain.  Skin:  Negative for itching and rash.  Neurological:  Negative for dizziness, headaches and numbness.  Hematological:  Bruises/bleeds easily.  Psychiatric/Behavioral:  Negative for depression, sleep disturbance and suicidal ideas. The patient is nervous/anxious (at times).   All other systems reviewed and are negative.    VITALS:   Blood pressure 102/67, pulse (!) 103, temperature (!) 96.4 F (35.8 C), resp. rate 20, weight 222 lb 6.4 oz (100.9 kg), SpO2 99%.  Wt Readings from Last 3 Encounters:  09/11/22 222 lb 6.4 oz (100.9 kg)  08/10/22 224 lb (101.6 kg)  01/12/22 208  lb (94.3 kg)    Body mass index is 30.16 kg/m.   PHYSICAL EXAM:   Physical Exam Vitals and nursing note reviewed. Exam conducted with a chaperone present.  Constitutional:      Appearance: Normal appearance.  Cardiovascular:     Rate and Rhythm: Normal rate and regular rhythm.     Pulses: Normal pulses.     Heart sounds: Normal heart sounds.  Pulmonary:     Effort: Pulmonary effort is normal.     Breath sounds: Normal breath sounds.  Abdominal:     Palpations: Abdomen is soft. There is no hepatomegaly, splenomegaly or mass.     Tenderness: There is abdominal tenderness.  Musculoskeletal:     Right lower leg: No edema.     Left lower leg: No edema.  Lymphadenopathy:     Cervical: No cervical adenopathy.     Right cervical: No superficial, deep or posterior cervical adenopathy.    Left cervical: No superficial, deep or posterior cervical adenopathy.     Upper Body:     Right upper body: No supraclavicular or axillary adenopathy.     Left upper body: No supraclavicular or axillary adenopathy.  Neurological:     General: No focal deficit present.     Mental Status: He is alert and oriented to person, place, and time.  Psychiatric:        Mood and Affect: Mood normal.        Behavior: Behavior normal.     LABS:      Latest Ref Rng & Units 09/11/2022    9:07 AM  10/15/2021    7:38 AM 10/10/2021   11:30 AM  CBC  WBC 4.0 - 10.5 K/uL 7.0   7.6   Hemoglobin 13.0 - 17.0 g/dL 09.8  11.9  14.7   Hematocrit 39.0 - 52.0 % 35.0  33.0  39.8   Platelets 150 - 400 K/uL 337   318       Latest Ref Rng & Units 09/11/2022    9:07 AM 10/15/2021    7:38 AM 10/10/2021   11:30 AM  CMP  Glucose 70 - 99 mg/dL  829  562   BUN 8 - 23 mg/dL  21  14   Creatinine 1.30 - 1.24 mg/dL  8.65  7.84   Sodium 696 - 145 mmol/L  139  140   Potassium 3.5 - 5.1 mmol/L  4.3  4.3   Chloride 98 - 111 mmol/L  106  103   CO2 22 - 32 mmol/L   27   Calcium 8.9 - 10.3 mg/dL 9.1   9.9      No results found for: "CEA1", "CEA" / No results found for: "CEA1", "CEA" No results found for: "PSA1" No results found for: "EXB284" No results found for: "CAN125"  No results found for: "TOTALPROTELP", "ALBUMINELP", "A1GS", "A2GS", "BETS", "BETA2SER", "GAMS", "MSPIKE", "SPEI" Lab Results  Component Value Date   TIBC 458 (H) 09/11/2022   FERRITIN 10 (L) 09/11/2022   IRONPCTSAT 6 (L) 09/11/2022   Lab Results  Component Value Date   LDH 112 09/11/2022     STUDIES:   No results found.

## 2022-09-11 ENCOUNTER — Inpatient Hospital Stay: Payer: Medicare Other

## 2022-09-11 ENCOUNTER — Inpatient Hospital Stay: Payer: Medicare Other | Attending: Hematology | Admitting: Hematology

## 2022-09-11 VITALS — BP 102/67 | HR 103 | Temp 96.4°F | Resp 20 | Wt 222.4 lb

## 2022-09-11 DIAGNOSIS — Z809 Family history of malignant neoplasm, unspecified: Secondary | ICD-10-CM | POA: Diagnosis not present

## 2022-09-11 DIAGNOSIS — K5900 Constipation, unspecified: Secondary | ICD-10-CM | POA: Insufficient documentation

## 2022-09-11 DIAGNOSIS — Z807 Family history of other malignant neoplasms of lymphoid, hematopoietic and related tissues: Secondary | ICD-10-CM | POA: Insufficient documentation

## 2022-09-11 DIAGNOSIS — L409 Psoriasis, unspecified: Secondary | ICD-10-CM | POA: Insufficient documentation

## 2022-09-11 DIAGNOSIS — Z87891 Personal history of nicotine dependence: Secondary | ICD-10-CM | POA: Diagnosis not present

## 2022-09-11 DIAGNOSIS — E119 Type 2 diabetes mellitus without complications: Secondary | ICD-10-CM

## 2022-09-11 DIAGNOSIS — D649 Anemia, unspecified: Secondary | ICD-10-CM

## 2022-09-11 DIAGNOSIS — D509 Iron deficiency anemia, unspecified: Secondary | ICD-10-CM | POA: Insufficient documentation

## 2022-09-11 DIAGNOSIS — Z86718 Personal history of other venous thrombosis and embolism: Secondary | ICD-10-CM | POA: Insufficient documentation

## 2022-09-11 DIAGNOSIS — Z8546 Personal history of malignant neoplasm of prostate: Secondary | ICD-10-CM | POA: Insufficient documentation

## 2022-09-11 LAB — CBC WITH DIFFERENTIAL/PLATELET
Abs Immature Granulocytes: 0.04 10*3/uL (ref 0.00–0.07)
Basophils Absolute: 0 10*3/uL (ref 0.0–0.1)
Basophils Relative: 0 %
Eosinophils Absolute: 0.1 10*3/uL (ref 0.0–0.5)
Eosinophils Relative: 1 %
HCT: 35 % — ABNORMAL LOW (ref 39.0–52.0)
Hemoglobin: 10.7 g/dL — ABNORMAL LOW (ref 13.0–17.0)
Immature Granulocytes: 1 %
Lymphocytes Relative: 20 %
Lymphs Abs: 1.4 10*3/uL (ref 0.7–4.0)
MCH: 28.6 pg (ref 26.0–34.0)
MCHC: 30.6 g/dL (ref 30.0–36.0)
MCV: 93.6 fL (ref 80.0–100.0)
Monocytes Absolute: 0.7 10*3/uL (ref 0.1–1.0)
Monocytes Relative: 10 %
Neutro Abs: 4.8 10*3/uL (ref 1.7–7.7)
Neutrophils Relative %: 68 %
Platelets: 337 10*3/uL (ref 150–400)
RBC: 3.74 MIL/uL — ABNORMAL LOW (ref 4.22–5.81)
RDW: 20.5 % — ABNORMAL HIGH (ref 11.5–15.5)
WBC: 7 10*3/uL (ref 4.0–10.5)
nRBC: 0 % (ref 0.0–0.2)

## 2022-09-11 LAB — RETICULOCYTES
Immature Retic Fract: 30.7 % — ABNORMAL HIGH (ref 2.3–15.9)
RBC.: 3.75 MIL/uL — ABNORMAL LOW (ref 4.22–5.81)
Retic Count, Absolute: 132.8 10*3/uL (ref 19.0–186.0)
Retic Ct Pct: 3.5 % — ABNORMAL HIGH (ref 0.4–3.1)

## 2022-09-11 LAB — VITAMIN B12: Vitamin B-12: 256 pg/mL (ref 180–914)

## 2022-09-11 LAB — IRON AND TIBC
Iron: 27 ug/dL — ABNORMAL LOW (ref 45–182)
Saturation Ratios: 6 % — ABNORMAL LOW (ref 17.9–39.5)
TIBC: 458 ug/dL — ABNORMAL HIGH (ref 250–450)
UIBC: 431 ug/dL

## 2022-09-11 LAB — FERRITIN: Ferritin: 10 ng/mL — ABNORMAL LOW (ref 24–336)

## 2022-09-11 LAB — LACTATE DEHYDROGENASE: LDH: 112 U/L (ref 98–192)

## 2022-09-11 LAB — CALCIUM: Calcium: 9.1 mg/dL (ref 8.9–10.3)

## 2022-09-11 LAB — FOLATE: Folate: >40 ng/mL (ref >5.9)

## 2022-09-11 NOTE — Patient Instructions (Signed)
[facility]  Discharge Instructions  You were seen and examined today by Dr. Katragadda. Dr. Katragadda is a hematologist, meaning that he specializes in blood abnormalities. Dr. Katragadda discussed your past medical history, family history of cancers/blood conditions and the events that led to you being here today.  You were referred to Dr. Katragadda due to anemia.  Dr. Katragadda has recommended additional labs today for further evaluation.  Follow-up as scheduled.  Thank you for choosing [facility] to provide your oncology and hematology care.   To afford each patient quality time with our provider, please arrive at least 15 minutes before your scheduled appointment time. You may need to reschedule your appointment if you arrive late (10 or more minutes). Arriving late affects you and other patients whose appointments are after yours.  Also, if you miss three or more appointments without notifying the office, you may be dismissed from the clinic at the provider's discretion.    Again, thank you for choosing [facility].  Our hope is that these requests will decrease the amount of time that you wait before being seen by our physicians.   If you have a lab appointment with the Cancer Center - please note that after April 8th, all labs will be drawn in the cancer center.  You do not have to check in or register with the main entrance as you have in the past but will complete your check-in at the cancer center.            _____________________________________________________________  Should you have questions after your visit to [facility], please contact our office at [phone] and follow the prompts.  Our office hours are 8:00 a.m. to 4:30 p.m. Monday - Thursday and 8:00 a.m. to 2:30 p.m. Friday.  Please note that voicemails left after 4:00 p.m. may not be returned until the following business day.  We are  closed weekends and all major holidays.  You do have access to a nurse 24-7, just call the main number to the clinic [phone] and do not press any options, hold on the line and a nurse will answer the phone.    For prescription refill requests, have your pharmacy contact our office and allow 72 hours.    Masks are no longer required in the cancer centers. If you would like for your care team to wear a mask while they are taking care of you, please let them know. You may have one support person who is at least 73 years old accompany you for your appointments.  

## 2022-09-14 LAB — KAPPA/LAMBDA LIGHT CHAINS
Kappa free light chain: 24.6 mg/L — ABNORMAL HIGH (ref 3.3–19.4)
Kappa, lambda light chain ratio: 1.24 (ref 0.26–1.65)
Lambda free light chains: 19.9 mg/L (ref 5.7–26.3)

## 2022-09-15 LAB — PROTEIN ELECTROPHORESIS, SERUM
A/G Ratio: 1.2 (ref 0.7–1.7)
Albumin ELP: 3.6 g/dL (ref 2.9–4.4)
Alpha-1-Globulin: 0.2 g/dL (ref 0.0–0.4)
Alpha-2-Globulin: 1 g/dL (ref 0.4–1.0)
Beta Globulin: 1.3 g/dL (ref 0.7–1.3)
Gamma Globulin: 0.6 g/dL (ref 0.4–1.8)
Globulin, Total: 3.1 g/dL (ref 2.2–3.9)
Total Protein ELP: 6.7 g/dL (ref 6.0–8.5)

## 2022-09-15 LAB — COPPER, SERUM: Copper: 93 ug/dL (ref 69–132)

## 2022-09-16 LAB — IMMUNOFIXATION ELECTROPHORESIS
IgA: 286 mg/dL (ref 61–437)
IgG (Immunoglobin G), Serum: 661 mg/dL (ref 603–1613)
IgM (Immunoglobulin M), Srm: 28 mg/dL (ref 15–143)
Total Protein ELP: 6.6 g/dL (ref 6.0–8.5)

## 2022-09-16 LAB — METHYLMALONIC ACID, SERUM: Methylmalonic Acid, Quantitative: 237 nmol/L (ref 0–378)

## 2022-09-24 ENCOUNTER — Inpatient Hospital Stay: Payer: Medicare Other | Admitting: Oncology

## 2022-09-24 VITALS — BP 117/76 | HR 105 | Temp 97.1°F | Resp 20 | Wt 222.7 lb

## 2022-09-24 DIAGNOSIS — D509 Iron deficiency anemia, unspecified: Secondary | ICD-10-CM | POA: Insufficient documentation

## 2022-09-24 DIAGNOSIS — L409 Psoriasis, unspecified: Secondary | ICD-10-CM | POA: Diagnosis not present

## 2022-09-24 DIAGNOSIS — Z809 Family history of malignant neoplasm, unspecified: Secondary | ICD-10-CM | POA: Diagnosis not present

## 2022-09-24 DIAGNOSIS — Z87891 Personal history of nicotine dependence: Secondary | ICD-10-CM | POA: Diagnosis not present

## 2022-09-24 DIAGNOSIS — D649 Anemia, unspecified: Secondary | ICD-10-CM | POA: Diagnosis not present

## 2022-09-24 DIAGNOSIS — Z807 Family history of other malignant neoplasms of lymphoid, hematopoietic and related tissues: Secondary | ICD-10-CM | POA: Diagnosis not present

## 2022-09-24 DIAGNOSIS — Z8546 Personal history of malignant neoplasm of prostate: Secondary | ICD-10-CM | POA: Diagnosis not present

## 2022-09-24 DIAGNOSIS — K5900 Constipation, unspecified: Secondary | ICD-10-CM | POA: Diagnosis not present

## 2022-09-24 DIAGNOSIS — Z86718 Personal history of other venous thrombosis and embolism: Secondary | ICD-10-CM | POA: Diagnosis not present

## 2022-09-24 NOTE — Progress Notes (Signed)
[facility] [address], [place] [address]   Clinic Day:  09/24/22   Referring physician: Genia Hotter, FNP  Patient Care Team: Stamey, Verda Cumins, FNP as PCP - General (Family Medicine) Corky Crafts, MD as PCP - Cardiology (Cardiology) Corky Crafts, MD as Attending Physician (Cardiology) Ernesto Rutherford, MD as Consulting Physician (Ophthalmology) Talmage Coin, MD as Consulting Physician (Endocrinology) Doreatha Massed, MD as Medical Oncologist (Hematology)   ASSESSMENT & PLAN:   Assessment:  1.  Normocytic anemia: - Labs at [facility] on 08/28/2022: Hb-9.6, MCV-88, WBC-5.1, PLT-347, creatinine-1.02, calcium 10.6.  Hemoglobin was 8.5 few weeks prior to that.  He started taking iron tablet 2 weeks ago.  Reports constipation. - Reports fatigue for 5 years. - Last colonoscopy 3 years ago.  Repeat colonoscopy planned on 09/25/2022. - Stool for occult blood checked -3 weeks ago at [facility] physicians.  2.  Social/family history: - Lives at home with his wife.  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  Non-smoker.  Chewed tobacco for few years and quit in October 17, 1975. - Sister died of Hodgkin's disease at age 82 in 84.  66.  T2a prostate cancer: - Gleason 3+4=7, PSA 7.54. - XRT 70 Gray in 28 fractions from 04/11/2018 - 05/18/2018 - PSA less than 0.1  Plan:  1.  Normocytic anemia: - Labs from 09/11/2022 showed hemoglobin 10.7 with a normal differential.  No evidence of bone marrow infiltrative process or hemolysis.  Immunofixation electrophoresis and protein electrophoresis are WNL.  No evidence of monoclonal protein.  Copper, MMA, folate and B12 are all within normal limits.  Iron panel revealed iron saturation 6% with TIBC of 458.  Ferritin is 10. -Repeated calcium level for hypercalcemia which returned normal.  Calcium level is 9.1. -He is currently taking oral iron tablets twice daily with some mild constipation. -Recommend  parenteral iron 1 g (which ever is approved by his insurance) and repeat labs in 6 to 8 weeks.  PLAN SUMMARY: >> Recommend IV iron 1 g in 1-2 weeks.  >> Follow-up in 6 to 8 weeks with labs a few days before and see MD/APP.     I spent 20 minutes dedicated to the care of this patient (face-to-face and non-face-to-face) on the date of the encounter to include what is described in the assessment and plan.  No orders of the defined types were placed in this encounter.   Mauro Kaufmann, NP   7/25/20248:36 AM  CHIEF COMPLAINT/PURPOSE OF CONSULT:   Diagnosis: anemia  Current Therapy:  iron pills  HISTORY OF PRESENT ILLNESS:   Michael [name] is a 72 y.o. male presenting to clinic today for follow-up and to review labs.   Reports doing well since his last visit.  Reports having stable chronic fatigue.  Denies any new concerns.  He is scheduled for a colonoscopy tomorrow so he is currently not taking iron supplement. FOBT was ordered on 6/28 and returned negative.  He continues to have mild constipation with iron supplements.    He has a history of prostate cancer approximately 5 years ago and is currently in remission.  He notes he had radiation for prostate cancer in 10/16/2017, which has lowered his testosterone levels. He denies being given Lupron shots with radiation. He notes he saw Dr. Kathrynn Running at [facility] for prostate cancer. He has an annual visit with urologist.  He lives at home with his wife.  Appetite is 100% and energy levels are 75%.  Reports he fell onto his left hip a few days ago after slipping in the bathroom.  States he has not noted any bruising but intends to follow-up with his PCP next week.  He is able to bear full body weight and ambulate with slight discomfort.  PAST MEDICAL HISTORY:   Past Medical History: Past Medical History:  Diagnosis Date   Allergy    Anxiety    Arthritis    psoriatic arthritis   Diabetes mellitus    GERD (gastroesophageal reflux disease)     Hyperlipidemia    Hypertension    PAD (peripheral artery disease) (HCC)    a. s/p prior LE stenting 2012, 03/2017 - followed by [facility].   Prostate cancer (HCC)    Sleep apnea 2016   wears CPAP sometimes    Surgical History: Past Surgical History:  Procedure Laterality Date   ABDOMINAL AORTOGRAM N/A 09/01/2016   Procedure: Abdominal Aortogram;  Surgeon: Nada Libman, MD;  [facility];  Service: Cardiovascular;  Laterality: N/A;   ABDOMINAL AORTOGRAM W/LOWER EXTREMITY N/A 03/16/2017   Procedure: ABDOMINAL AORTOGRAM W/LOWER EXTREMITY;  Surgeon: Nada Libman, MD;  [facility];  Service: Cardiovascular;  Laterality: N/A;   ANGIOPLASTY Right 01/30/2021   Procedure: BALLOON ANGIOPLASTY;  Surgeon: Nada Libman, MD;  [facility];  Service: Vascular;  Laterality: Right;   ANGIOPLASTY Right 10/15/2021   Procedure: RIGHT SUPERFICIAL FEMORAL ARTERY BALLOON ANGIOPLASTY;  Surgeon: Nada Libman, MD;  [facility];  Service: Vascular;  Laterality: Right;   ANGIOPLASTY / STENTING FEMORAL  11/05/2010   Left SFA stent   AORTOGRAM N/A 10/15/2021   Procedure: ABDOMINAL AORTOGRAM;  Surgeon: Nada Libman, MD;  [facility];  Service: Vascular;  Laterality: N/A;   APPLICATION OF WOUND VAC Right 04/16/2021   Procedure: APPLICATION OF WOUND VAC;  Surgeon: Nada Libman, MD;  [facility];  Service: Vascular;  Laterality: Right;   APPLICATION OF WOUND VAC Right 05/02/2021   Procedure: APPLICATION OF WOUND VAC;  Surgeon: Chuck Hint, MD;  Location: [facility] OR;  Service: Vascular;  Laterality: Right;   APPLICATION OF WOUND VAC Right 05/05/2021   Procedure: WOUND VAC CHANGE RIGHT GROIN, APPLICATION OF WOUND VAC PROXIMAL AND DISTAL MEDIAL RIGHT THIGH;  Surgeon: Leonie Douglas, MD;  [facility];  Service: Vascular;  Laterality: Right;   ENDARTERECTOMY FEMORAL Right 04/16/2021   Procedure: RIGHT FEMORAL ENDARTERECTOMY;  Surgeon: Nada Libman, MD;  [facility];  Service: Vascular;  Laterality: Right;   EYE SURGERY  2010   bilateral cataract   FEMORAL-POPLITEAL BYPASS GRAFT Right 04/16/2021   Procedure: RIGHT FEMORAL-POPLITEAL BYPASS USING VEIN;  Surgeon: Nada Libman, MD;  [facility];  Service: Vascular;  Laterality: Right;   GROIN DEBRIDEMENT Right 05/05/2021   Procedure: IRRIGATION AND DEBRIDEMENT RIGHT GROIN AND RIGHT THIGH;  Surgeon: Leonie Douglas, MD;  [facility];  Service: Vascular;  Laterality: Right;   I & D EXTREMITY Right 05/02/2021   Procedure: IRRIGATION AND DEBRIDEMENT RIGHT LEG;  Surgeon: Chuck Hint, MD;  Location: [facility] OR;  Service: Vascular;  Laterality: Right;   I & D EXTREMITY Right 10/15/2021   Procedure: IRRIGATION AND DEBRIDEMENT RIGHT LEG;  Surgeon: Nada Libman, MD;  [facility];  Service: Vascular;  Laterality: Right;   KNEE ARTHROSCOPY     LOWER EXTREMITY ANGIOGRAM Right 01/30/2021   Procedure: LOWER EXTREMITY ANGIOGRAM WITH ARTERIOGRAM OF RIGHT LOWER  EXTREMITY;  Surgeon: Nada Libman, MD;  Location: [facility] OR;  Service: Vascular;  Laterality: Right;   LOWER EXTREMITY ANGIOGRAPHY N/A 09/01/2016   Procedure: Lower Extremity Angiography;  Surgeon: Nada Libman, MD;  [facility];  Service: Cardiovascular;  Laterality: N/A;   PERIPHERAL VASCULAR ATHERECTOMY Right 09/01/2016   Procedure: Peripheral Vascular Atherectomy;  Surgeon: Nada Libman, MD;  [facility];  Service: Cardiovascular;  Laterality: Right;  superficial femoral   PERIPHERAL VASCULAR INTERVENTION Right 03/16/2017   Procedure: PERIPHERAL VASCULAR INTERVENTION;  Surgeon: Nada Libman, MD;  [facility];  Service: Cardiovascular;  Laterality: Right;  superficicial femoral   ROTATOR CUFF REPAIR     1990   ROTATOR CUFF REPAIR Bilateral 2017   ULTRASOUND GUIDANCE FOR VASCULAR ACCESS Left 01/30/2021   Procedure: ULTRASOUND GUIDANCE FOR VASCULAR ACCESS;   Surgeon: Nada Libman, MD;  [facility];  Service: Vascular;  Laterality: Left;   ULTRASOUND GUIDANCE FOR VASCULAR ACCESS Left 10/15/2021   Procedure: ULTRASOUND GUIDANCE FOR VASCULAR ACCESS;  Surgeon: Nada Libman, MD;  [facility];  Service: Vascular;  Laterality: Left;   VEIN HARVEST Right 04/16/2021   Procedure: RIGHT GREATER SAPHENOUS VEIN HARVEST;  Surgeon: Nada Libman, MD;  [facility];  Service: Vascular;  Laterality: Right;    Social History: Social History   Socioeconomic History   Marital status: Married    Spouse name: Not on file   Number of children: Not on file   Years of education: Not on file   Highest education level: Not on file  Occupational History   Not on file  Tobacco Use   Smoking status: Never    Passive exposure: Never   Smokeless tobacco: Former    Quit date: 10/26/1981  Vaping Use   Vaping status: Never Used  Substance and Sexual Activity   Alcohol use: Yes    Comment: occasional drink, maybe a glass of wine once every 6 months   Drug use: No   Sexual activity: Not on file  Other Topics Concern   Not on file  Social History Narrative   Not on file   Social Determinants of Health   Financial Resource Strain: Not on file  Food Insecurity: Not on file  Transportation Needs: Not on file  Physical Activity: Not on file  Stress: Not on file  Social Connections: Not on file  Intimate Partner Violence: Not on file    Family History: Family History  Problem Relation Age of Onset   Hyperlipidemia Mother    Hypertension Mother    Heart disease Father    Deep vein thrombosis Father    Hyperlipidemia Father    Hypertension Father    Heart attack Father    Peripheral vascular disease Father    Cancer Sister     Current Medications:  Current Outpatient Medications:    ACCU-CHEK AVIVA PLUS test strip, AS DIRECTED SEVEN TIMES A DAY IN VITRO 90 DAYS, Disp: , Rfl: 4   acetaminophen (TYLENOL) 500 MG tablet, Take 1,000 mg  by mouth every 6 (six) hours as needed for moderate pain., Disp: , Rfl:    aspirin EC 81 MG tablet, Take 1 tablet (81 mg total) by mouth daily., Disp: , Rfl:    atorvastatin (LIPITOR) 40 MG tablet, Take 40 mg by mouth at bedtime., Disp: , Rfl: 5   benazepril (LOTENSIN) 40 MG tablet, Take 40 mg by mouth daily., Disp: , Rfl:  cefadroxil (DURICEF) 500 MG capsule, Take 2 capsules (1,000 mg total) by mouth 2 (two) times daily., Disp: 56 capsule, Rfl: 0   clonazePAM (KLONOPIN) 1 MG tablet, Take 2 mg by mouth at bedtime as needed (sleep)., Disp: , Rfl:    clopidogrel (PLAVIX) 75 MG tablet, TAKE 1 TABLET BY MOUTH EVERY DAY WITH BREAKFAST, Disp: 90 tablet, Rfl: 3   Continuous Blood Gluc Receiver (FREESTYLE LIBRE 14 DAY READER) DEVI, use to monitor blood sugar, Disp: , Rfl:    dapagliflozin propanediol (FARXIGA) 5 MG TABS tablet, Take 5 mg by mouth daily., Disp: , Rfl:    diphenhydrAMINE (BENADRYL) 25 MG tablet, Take 25 mg by mouth at bedtime as needed for sleep., Disp: , Rfl:    HUMALOG KWIKPEN 100 UNIT/ML KwikPen, Inject 30 Units into the skin 2 (two) times daily., Disp: , Rfl:    hydrochlorothiazide (HYDRODIURIL) 25 MG tablet, Take 1 tablet (25 mg total) by mouth daily. Please make overdue appt with Dr. Eldridge Dace before anymore refills. 2nd attempt, Disp: 15 tablet, Rfl: 0   insulin glargine (LANTUS) 100 UNIT/ML injection, Inject 20 Units into the skin at bedtime., Disp: , Rfl:    metFORMIN (GLUCOPHAGE-XR) 500 MG 24 hr tablet, Take 2,000 mg by mouth at bedtime., Disp: , Rfl:    solifenacin (VESICARE) 5 MG tablet, Take 5 mg by mouth every evening., Disp: , Rfl:    tamsulosin (FLOMAX) 0.4 MG CAPS capsule, Take 0.4 mg by mouth at bedtime., Disp: , Rfl: 5   traZODone (DESYREL) 50 MG tablet, Take 50 mg by mouth at bedtime as needed for sleep., Disp: , Rfl:    trolamine salicylate (ASPERCREME) 10 % cream, Apply 1 Application topically as needed for muscle pain., Disp: , Rfl:    Allergies: Allergies   Allergen Reactions   Demerol Anaphylaxis   Lactose Intolerance (Gi) Anaphylaxis   Oxycodone Anxiety and Other (See Comments)    Suicidal ideation   Penicillins Anaphylaxis and Other (See Comments)    Tolerates cefepime    Actos [Pioglitazone] Nausea Only   Exenatide Nausea Only and Other (See Comments)   Glimepiride Other (See Comments)    Edema in ankles    Nabumetone Nausea Only and Other (See Comments)   Victoza [Liraglutide] Nausea Only    Shaking   Fentanyl Anxiety    Jittery    Hydrocodone Anxiety    Suicidal ideation    REVIEW OF SYSTEMS:   Review of Systems  Constitutional:  Positive for fatigue.  Musculoskeletal:  Positive for arthralgias (Left hip).  Neurological:  Positive for numbness.     VITALS:   There were no vitals taken for this visit.  Wt Readings from Last 3 Encounters:  09/11/22 222 lb 6.4 oz (100.9 kg)  08/10/22 224 lb (101.6 kg)  01/12/22 208 lb (94.3 kg)    There is no height or weight on file to calculate BMI.   PHYSICAL EXAM:   Physical Exam Constitutional:      Appearance: Normal appearance.  Cardiovascular:     Rate and Rhythm: Normal rate and regular rhythm.  Pulmonary:     Effort: Pulmonary effort is normal.     Breath sounds: Normal breath sounds.  Abdominal:     General: Bowel sounds are normal.     Palpations: Abdomen is soft.  Musculoskeletal:        General: No swelling. Normal range of motion.     Left hip: Tenderness present.     Comments: No obvious deformity to  left hip.  Recommend follow-up with PCP.  Neurological:     Mental Status: He is alert and oriented to person, place, and time. Mental status is at baseline.     LABS:      Latest Ref Rng & Units 09/11/2022    9:07 AM 10/15/2021    7:38 AM 10/10/2021   11:30 AM  CBC  WBC 4.0 - 10.5 K/uL 7.0   7.6   Hemoglobin 13.0 - 17.0 g/dL 38.7  56.4  33.2   Hematocrit 39.0 - 52.0 % 35.0  33.0  39.8   Platelets 150 - 400 K/uL 337   318       Latest Ref Rng &  Units 09/11/2022    9:07 AM 10/15/2021    7:38 AM 10/10/2021   11:30 AM  CMP  Glucose 70 - 99 mg/dL  951  884   BUN 8 - 23 mg/dL  21  14   Creatinine 1.66 - 1.24 mg/dL  0.63  0.16   Sodium 010 - 145 mmol/L  139  140   Potassium 3.5 - 5.1 mmol/L  4.3  4.3   Chloride 98 - 111 mmol/L  106  103   CO2 22 - 32 mmol/L   27   Calcium 8.9 - 10.3 mg/dL 9.1   9.9      No results found for: "CEA1", "CEA" / No results found for: "CEA1", "CEA" No results found for: "PSA1" No results found for: "CAN199" No results found for: "CAN125"  Lab Results  Component Value Date   TOTALPROTELP 6.6 09/11/2022   TOTALPROTELP 6.7 09/11/2022   ALBUMINELP 3.6 09/11/2022   A1GS 0.2 09/11/2022   A2GS 1.0 09/11/2022   BETS 1.3 09/11/2022   GAMS 0.6 09/11/2022   MSPIKE Not Observed 09/11/2022   SPEI Comment 09/11/2022   Lab Results  Component Value Date   TIBC 458 (H) 09/11/2022   FERRITIN 10 (L) 09/11/2022   IRONPCTSAT 6 (L) 09/11/2022   Lab Results  Component Value Date   LDH 112 09/11/2022     STUDIES:   No results found.

## 2022-09-25 DIAGNOSIS — K5730 Diverticulosis of large intestine without perforation or abscess without bleeding: Secondary | ICD-10-CM | POA: Diagnosis not present

## 2022-09-25 DIAGNOSIS — Z09 Encounter for follow-up examination after completed treatment for conditions other than malignant neoplasm: Secondary | ICD-10-CM | POA: Diagnosis not present

## 2022-09-25 DIAGNOSIS — K648 Other hemorrhoids: Secondary | ICD-10-CM | POA: Diagnosis not present

## 2022-09-25 DIAGNOSIS — Z86010 Personal history of colonic polyps: Secondary | ICD-10-CM | POA: Diagnosis not present

## 2022-09-25 DIAGNOSIS — K635 Polyp of colon: Secondary | ICD-10-CM | POA: Diagnosis not present

## 2022-09-27 DIAGNOSIS — E1165 Type 2 diabetes mellitus with hyperglycemia: Secondary | ICD-10-CM | POA: Diagnosis not present

## 2022-09-29 ENCOUNTER — Inpatient Hospital Stay: Payer: Medicare Other

## 2022-09-29 VITALS — BP 127/60 | HR 72 | Temp 97.0°F | Resp 19

## 2022-09-29 DIAGNOSIS — D649 Anemia, unspecified: Secondary | ICD-10-CM | POA: Diagnosis not present

## 2022-09-29 DIAGNOSIS — Z809 Family history of malignant neoplasm, unspecified: Secondary | ICD-10-CM | POA: Diagnosis not present

## 2022-09-29 DIAGNOSIS — L409 Psoriasis, unspecified: Secondary | ICD-10-CM | POA: Diagnosis not present

## 2022-09-29 DIAGNOSIS — Z86718 Personal history of other venous thrombosis and embolism: Secondary | ICD-10-CM | POA: Diagnosis not present

## 2022-09-29 DIAGNOSIS — Z87891 Personal history of nicotine dependence: Secondary | ICD-10-CM | POA: Diagnosis not present

## 2022-09-29 DIAGNOSIS — Z8546 Personal history of malignant neoplasm of prostate: Secondary | ICD-10-CM | POA: Diagnosis not present

## 2022-09-29 DIAGNOSIS — D509 Iron deficiency anemia, unspecified: Secondary | ICD-10-CM | POA: Diagnosis not present

## 2022-09-29 DIAGNOSIS — K5900 Constipation, unspecified: Secondary | ICD-10-CM | POA: Diagnosis not present

## 2022-09-29 DIAGNOSIS — Z807 Family history of other malignant neoplasms of lymphoid, hematopoietic and related tissues: Secondary | ICD-10-CM | POA: Diagnosis not present

## 2022-09-29 MED ORDER — DIPHENHYDRAMINE HCL 50 MG/ML IJ SOLN: 50.0000 mg | Freq: Once | INTRAMUSCULAR | Status: DC | PRN

## 2022-09-29 MED ORDER — ALTEPLASE 2 MG IJ SOLR: 2.0000 mg | Freq: Once | INTRAMUSCULAR | Status: DC | PRN

## 2022-09-29 MED ORDER — HEPARIN SOD (PORK) LOCK FLUSH 100 UNIT/ML IV SOLN: 500.0000 [IU] | Freq: Once | INTRAVENOUS | Status: DC | PRN

## 2022-09-29 MED ORDER — SODIUM CHLORIDE 0.9 % IV SOLN: Freq: Once | INTRAVENOUS | Status: DC | PRN

## 2022-09-29 MED ORDER — METHYLPREDNISOLONE SODIUM SUCC 125 MG IJ SOLR
125.0000 mg | Freq: Once | INTRAMUSCULAR | Status: AC
  Administered 2022-09-29: 125 mg via INTRAVENOUS
  Filled 2022-09-29: qty 2

## 2022-09-29 MED ORDER — FAMOTIDINE IN NACL 20-0.9 MG/50ML-% IV SOLN: 20.0000 mg | Freq: Once | INTRAVENOUS | Status: DC | PRN

## 2022-09-29 MED ORDER — SODIUM CHLORIDE 0.9% FLUSH: 10.0000 mL | Freq: Once | INTRAVENOUS | Status: DC | PRN

## 2022-09-29 MED ORDER — METHYLPREDNISOLONE SODIUM SUCC 125 MG IJ SOLR: 125.0000 mg | Freq: Once | INTRAMUSCULAR | Status: DC | PRN

## 2022-09-29 MED ORDER — ALBUTEROL SULFATE HFA 108 (90 BASE) MCG/ACT IN AERS: 2.0000 | INHALATION_SPRAY | Freq: Once | RESPIRATORY_TRACT | Status: DC | PRN

## 2022-09-29 MED ORDER — CETIRIZINE HCL 10 MG PO TABS
10.0000 mg | ORAL_TABLET | Freq: Once | ORAL | Status: AC
  Administered 2022-09-29: 10 mg via ORAL
  Filled 2022-09-29: qty 1

## 2022-09-29 MED ORDER — SODIUM CHLORIDE 0.9% FLUSH: 3.0000 mL | Freq: Once | INTRAVENOUS | Status: DC | PRN

## 2022-09-29 MED ORDER — ACETAMINOPHEN 325 MG PO TABS
650.0000 mg | ORAL_TABLET | Freq: Once | ORAL | Status: AC
  Administered 2022-09-29: 650 mg via ORAL
  Filled 2022-09-29: qty 2

## 2022-09-29 MED ORDER — SODIUM CHLORIDE 0.9 % IV SOLN: Freq: Once | INTRAVENOUS | Status: AC

## 2022-09-29 MED ORDER — FAMOTIDINE IN NACL 20-0.9 MG/50ML-% IV SOLN
20.0000 mg | Freq: Once | INTRAVENOUS | Status: AC
  Administered 2022-09-29: 20 mg via INTRAVENOUS
  Filled 2022-09-29: qty 50

## 2022-09-29 MED ORDER — HEPARIN SOD (PORK) LOCK FLUSH 100 UNIT/ML IV SOLN: 250.0000 [IU] | Freq: Once | INTRAVENOUS | Status: DC | PRN

## 2022-09-29 MED ORDER — EPINEPHRINE 0.3 MG/0.3ML IJ SOAJ: 0.3000 mg | Freq: Once | INTRAMUSCULAR | Status: DC | PRN

## 2022-09-29 MED ORDER — SODIUM CHLORIDE 0.9 % IV SOLN
510.0000 mg | Freq: Once | INTRAVENOUS | Status: AC
  Administered 2022-09-29: 510 mg via INTRAVENOUS
  Filled 2022-09-29: qty 17

## 2022-09-29 NOTE — Progress Notes (Signed)
Patient presents today for iron infusion.  Patient is in satisfactory condition with no new complaints voiced.  Vital signs are stable.  We will proceed with infusion per provider orders.    Peripheral IV started with good blood return pre and post infusion.  Feraheme 510 mg  given today per MD orders. Tolerated infusion without adverse affects. Vital signs stable. No complaints at this time. Discharged from clinic ambulatory in stable condition. Alert and oriented x 3. F/U with [facility] as scheduled.   

## 2022-09-29 NOTE — Patient Instructions (Signed)
[facility]  Discharge Instructions: Thank you for choosing [facility] to provide your oncology and hematology care.  If you have a lab appointment with the Cancer Center - please note that after April 8th, 2024, all labs will be drawn in the cancer center.  You do not have to check in or register with the main entrance as you have in the past but will complete your check-in in the cancer center.  Wear comfortable clothing and clothing appropriate for easy access to any Portacath or PICC line.   We strive to give you quality time with your provider. You may need to reschedule your appointment if you arrive late (15 or more minutes).  Arriving late affects you and other patients whose appointments are after yours.  Also, if you miss three or more appointments without notifying the office, you may be dismissed from the clinic at the provider's discretion.      For prescription refill requests, have your pharmacy contact our office and allow 72 hours for refills to be completed.    Today you received Feraheme IV iron infusion.   .  BELOW ARE SYMPTOMS THAT SHOULD BE REPORTED IMMEDIATELY: *FEVER GREATER THAN 100.4 F (38 C) OR HIGHER *CHILLS OR SWEATING *NAUSEA AND VOMITING THAT IS NOT CONTROLLED WITH YOUR NAUSEA MEDICATION *UNUSUAL SHORTNESS OF BREATH *UNUSUAL BRUISING OR BLEEDING *URINARY PROBLEMS (pain or burning when urinating, or frequent urination) *BOWEL PROBLEMS (unusual diarrhea, constipation, pain near the anus) TENDERNESS IN MOUTH AND THROAT WITH OR WITHOUT PRESENCE OF ULCERS (sore throat, sores in mouth, or a toothache) UNUSUAL RASH, SWELLING OR PAIN  UNUSUAL VAGINAL DISCHARGE OR ITCHING   Items with * indicate a potential emergency and should be followed up as soon as possible or go to the Emergency Department if any problems should occur.  Please show the CHEMOTHERAPY ALERT CARD or IMMUNOTHERAPY ALERT CARD at check-in to the Emergency  Department and triage nurse.  Should you have questions after your visit or need to cancel or reschedule your appointment, please contact [facility] [phone]  and follow the prompts.  Office hours are 8:00 a.m. to 4:30 p.m. Monday - Friday. Please note that voicemails left after 4:00 p.m. may not be returned until the following business day.  We are closed weekends and major holidays. You have access to a nurse at all times for urgent questions. Please call the main number to the clinic [phone] and follow the prompts.  For any non-urgent questions, you may also contact your provider using MyChart. We now offer e-Visits for anyone 18 and older to request care online for non-urgent symptoms. For details visit [email or web address].   Also download the MyChart app! Go to the app store, search "MyChart", open the app, select [facility], and log in with your MyChart username and password.   

## 2022-10-06 ENCOUNTER — Inpatient Hospital Stay: Payer: Medicare Other | Attending: Hematology

## 2022-10-06 VITALS — BP 117/68 | HR 66 | Temp 97.5°F | Resp 18

## 2022-10-06 DIAGNOSIS — D509 Iron deficiency anemia, unspecified: Secondary | ICD-10-CM | POA: Diagnosis not present

## 2022-10-06 DIAGNOSIS — D649 Anemia, unspecified: Secondary | ICD-10-CM | POA: Insufficient documentation

## 2022-10-06 MED ORDER — FAMOTIDINE IN NACL 20-0.9 MG/50ML-% IV SOLN
20.0000 mg | Freq: Once | INTRAVENOUS | Status: AC
  Administered 2022-10-06: 20 mg via INTRAVENOUS

## 2022-10-06 MED ORDER — SODIUM CHLORIDE 0.9 % IV SOLN: Freq: Once | INTRAVENOUS | Status: AC

## 2022-10-06 MED ORDER — SODIUM CHLORIDE 0.9 % IV SOLN
510.0000 mg | Freq: Once | INTRAVENOUS | Status: AC
  Administered 2022-10-06: 510 mg via INTRAVENOUS
  Filled 2022-10-06: qty 510

## 2022-10-06 MED ORDER — METHYLPREDNISOLONE SODIUM SUCC 125 MG IJ SOLR
125.0000 mg | Freq: Once | INTRAMUSCULAR | Status: AC
  Administered 2022-10-06: 125 mg via INTRAVENOUS
  Filled 2022-10-06: qty 2

## 2022-10-06 NOTE — Patient Instructions (Signed)
[facility]  Discharge Instructions: Thank you for choosing [facility] to provide your oncology and hematology care.  If you have a lab appointment with the Cancer Center - please note that after April 8th, 2024, all labs will be drawn in the cancer center.  You do not have to check in or register with the main entrance as you have in the past but will complete your check-in in the cancer center.  Wear comfortable clothing and clothing appropriate for easy access to any Portacath or PICC line.   We strive to give you quality time with your provider. You may need to reschedule your appointment if you arrive late (15 or more minutes).  Arriving late affects you and other patients whose appointments are after yours.  Also, if you miss three or more appointments without notifying the office, you may be dismissed from the clinic at the provider's discretion.      For prescription refill requests, have your pharmacy contact our office and allow 72 hours for refills to be completed.    Today you received Feraheme IV iron infusion.   .  BELOW ARE SYMPTOMS THAT SHOULD BE REPORTED IMMEDIATELY: *FEVER GREATER THAN 100.4 F (38 C) OR HIGHER *CHILLS OR SWEATING *NAUSEA AND VOMITING THAT IS NOT CONTROLLED WITH YOUR NAUSEA MEDICATION *UNUSUAL SHORTNESS OF BREATH *UNUSUAL BRUISING OR BLEEDING *URINARY PROBLEMS (pain or burning when urinating, or frequent urination) *BOWEL PROBLEMS (unusual diarrhea, constipation, pain near the anus) TENDERNESS IN MOUTH AND THROAT WITH OR WITHOUT PRESENCE OF ULCERS (sore throat, sores in mouth, or a toothache) UNUSUAL RASH, SWELLING OR PAIN  UNUSUAL VAGINAL DISCHARGE OR ITCHING   Items with * indicate a potential emergency and should be followed up as soon as possible or go to the Emergency Department if any problems should occur.  Please show the CHEMOTHERAPY ALERT CARD or IMMUNOTHERAPY ALERT CARD at check-in to the Emergency  Department and triage nurse.  Should you have questions after your visit or need to cancel or reschedule your appointment, please contact [facility] [phone]  and follow the prompts.  Office hours are 8:00 a.m. to 4:30 p.m. Monday - Friday. Please note that voicemails left after 4:00 p.m. may not be returned until the following business day.  We are closed weekends and major holidays. You have access to a nurse at all times for urgent questions. Please call the main number to the clinic [phone] and follow the prompts.  For any non-urgent questions, you may also contact your provider using MyChart. We now offer e-Visits for anyone 18 and older to request care online for non-urgent symptoms. For details visit [email or web address].   Also download the MyChart app! Go to the app store, search "MyChart", open the app, select [facility], and log in with your MyChart username and password.   

## 2022-10-06 NOTE — Progress Notes (Signed)
Patient presents today for Feraheme infusion. Patient took Tylenol 650 mg and 10 mg of Claritin prior to arrival. Vital signs stable. Patient has no complaints of any side effects related to last iron infusion.   Feraheme given today per MD orders. Tolerated infusion without adverse affects. Vital signs stable. No complaints at this time. Patient declined his 30 minute monitor time post iron. Patient teaching performed. Understanding verbalized. Discharged from clinic ambulatory in stable condition. Alert and oriented x 3. F/U with [facility] as scheduled.

## 2022-10-22 DIAGNOSIS — L400 Psoriasis vulgaris: Secondary | ICD-10-CM | POA: Diagnosis not present

## 2022-10-22 DIAGNOSIS — Z79899 Other long term (current) drug therapy: Secondary | ICD-10-CM | POA: Diagnosis not present

## 2022-10-22 DIAGNOSIS — L4050 Arthropathic psoriasis, unspecified: Secondary | ICD-10-CM | POA: Diagnosis not present

## 2022-10-26 DIAGNOSIS — E1142 Type 2 diabetes mellitus with diabetic polyneuropathy: Secondary | ICD-10-CM | POA: Diagnosis not present

## 2022-10-26 DIAGNOSIS — B351 Tinea unguium: Secondary | ICD-10-CM | POA: Diagnosis not present

## 2022-10-26 DIAGNOSIS — L851 Acquired keratosis [keratoderma] palmaris et plantaris: Secondary | ICD-10-CM | POA: Diagnosis not present

## 2022-10-26 DIAGNOSIS — M79672 Pain in left foot: Secondary | ICD-10-CM | POA: Diagnosis not present

## 2022-10-27 DIAGNOSIS — Z794 Long term (current) use of insulin: Secondary | ICD-10-CM | POA: Diagnosis not present

## 2022-10-27 DIAGNOSIS — E1151 Type 2 diabetes mellitus with diabetic peripheral angiopathy without gangrene: Secondary | ICD-10-CM | POA: Diagnosis not present

## 2022-10-27 DIAGNOSIS — Z8546 Personal history of malignant neoplasm of prostate: Secondary | ICD-10-CM | POA: Diagnosis not present

## 2022-10-27 DIAGNOSIS — D649 Anemia, unspecified: Secondary | ICD-10-CM | POA: Diagnosis not present

## 2022-10-27 DIAGNOSIS — E1165 Type 2 diabetes mellitus with hyperglycemia: Secondary | ICD-10-CM | POA: Diagnosis not present

## 2022-11-04 ENCOUNTER — Other Ambulatory Visit: Payer: Self-pay

## 2022-11-04 DIAGNOSIS — D509 Iron deficiency anemia, unspecified: Secondary | ICD-10-CM

## 2022-11-05 ENCOUNTER — Inpatient Hospital Stay: Payer: Medicare Other

## 2022-11-09 ENCOUNTER — Other Ambulatory Visit: Payer: Self-pay | Admitting: Vascular Surgery

## 2022-11-09 ENCOUNTER — Inpatient Hospital Stay: Payer: Medicare Other | Attending: Hematology

## 2022-11-09 DIAGNOSIS — D509 Iron deficiency anemia, unspecified: Secondary | ICD-10-CM | POA: Insufficient documentation

## 2022-11-09 DIAGNOSIS — I739 Peripheral vascular disease, unspecified: Secondary | ICD-10-CM

## 2022-11-09 LAB — CBC WITH DIFFERENTIAL/PLATELET
Abs Immature Granulocytes: 0.03 10*3/uL (ref 0.00–0.07)
Basophils Absolute: 0 10*3/uL (ref 0.0–0.1)
Basophils Relative: 0 %
Eosinophils Absolute: 0.1 10*3/uL (ref 0.0–0.5)
Eosinophils Relative: 1 %
HCT: 42.5 % (ref 39.0–52.0)
Hemoglobin: 13.5 g/dL (ref 13.0–17.0)
Immature Granulocytes: 0 %
Lymphocytes Relative: 21 %
Lymphs Abs: 1.5 10*3/uL (ref 0.7–4.0)
MCH: 31 pg (ref 26.0–34.0)
MCHC: 31.8 g/dL (ref 30.0–36.0)
MCV: 97.7 fL (ref 80.0–100.0)
Monocytes Absolute: 0.5 10*3/uL (ref 0.1–1.0)
Monocytes Relative: 7 %
Neutro Abs: 5 10*3/uL (ref 1.7–7.7)
Neutrophils Relative %: 71 %
Platelets: 216 10*3/uL (ref 150–400)
RBC: 4.35 MIL/uL (ref 4.22–5.81)
RDW: 18.8 % — ABNORMAL HIGH (ref 11.5–15.5)
WBC: 7.1 10*3/uL (ref 4.0–10.5)
nRBC: 0 % (ref 0.0–0.2)

## 2022-11-09 LAB — IRON AND TIBC
Iron: 121 ug/dL (ref 45–182)
Saturation Ratios: 37 % (ref 17.9–39.5)
TIBC: 330 ug/dL (ref 250–450)
UIBC: 209 ug/dL

## 2022-11-09 LAB — FERRITIN: Ferritin: 80 ng/mL (ref 24–336)

## 2022-11-12 ENCOUNTER — Inpatient Hospital Stay: Payer: Medicare Other | Admitting: Oncology

## 2022-11-12 VITALS — BP 118/64 | HR 84 | Temp 97.8°F | Resp 16 | Wt 222.7 lb

## 2022-11-12 DIAGNOSIS — D509 Iron deficiency anemia, unspecified: Secondary | ICD-10-CM

## 2022-11-12 NOTE — Progress Notes (Signed)
[facility] [address], [place] [address]   Clinic Day:  11/12/22   Referring physician: Genia Hotter, FNP  Patient Care Team: Stamey, Verda Cumins, FNP as PCP - General (Family Medicine) Corky Crafts, MD as PCP - Cardiology (Cardiology) Corky Crafts, MD as Attending Physician (Cardiology) Ernesto Rutherford, MD as Consulting Physician (Ophthalmology) Talmage Coin, MD as Consulting Physician (Endocrinology) Doreatha Massed, MD as Medical Oncologist (Hematology)   ASSESSMENT & PLAN:   Assessment:  1.  Normocytic anemia: - Labs at [place] on 08/28/2022: Hb-9.6, MCV-88, WBC-5.1, PLT-347, creatinine-1.02, calcium 10.6.  Hemoglobin was 8.5 few weeks prior to that.  He started taking iron tablet 2 weeks ago.  Reports constipation. - Reports fatigue for 5 years. - Last colonoscopy 3 years ago.  Repeat colonoscopy planned on 09/25/2022. - Stool for occult blood checked -3 weeks ago at [facility].  2.  Social/family history: - Lives at home with his wife.  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  Non-smoker.  Chewed tobacco for few years and quit in 1975/12/10. - Sister died of Hodgkin's disease at age 14 in 61.  82.  T2a prostate cancer: - Gleason 3+4=7, PSA 7.54. - XRT 70 Gray in 28 fractions from 04/11/2018 - 05/18/2018 - PSA less than 0.1  Plan:  1.  Normocytic anemia: - Secondary to iron deficiency s/p 2 doses of IV iron. -Per patient had negative colonoscopy 2 months ago with Dr. Levora Angel with [facility].  - No other nutritional deficiencies.  -No evidence of MPN.  No evidence of hemolysis. -He received 2 doses of IV Feraheme on 09/29/2022 and on 10/06/2022.  Tolerated them well. -Repeat labs from 11/09/2022 show a ferritin of 80 and iron saturation of 37%.  Hemoglobin improved to 13.5 (10.7).  Differential is normal. -No additional IV iron needed at this time. - Continue iron tablets. -Recommend return to clinic in  12 weeks for follow-up with labs a few days before.  PLAN SUMMARY: >> No additional IV iron needed at this time. >> Follow-up in 12 weeks with labs a few days before.    I spent 20 minutes dedicated to the care of this patient (face-to-face and non-face-to-face) on the date of the encounter to include what is described in the assessment and plan.  No orders of the defined types were placed in this encounter.   Michael Kaufmann, Michael [name]   9/12/20241:29 PM  CHIEF COMPLAINT/PURPOSE OF CONSULT:   Diagnosis: anemia  Current Therapy:  iron pills and intermittent IV Feraheme.  HISTORY OF PRESENT ILLNESS:  Michael [name] is a 72 year old male with past medical history difficult for hypertension, PVD D, PAD, diabetes type 2, lumbar radiculopathy, prostate cancer and iron deficiency anemia.  He was last seen in clinic on 09/24/2022.  He received 2 doses of IV Feraheme on 09/29/2022 and 10/06/2022.  Tolerated them well.  Reports doing well since his last visit.  Fatigue has improved dramatically.  Appetite and energy levels are 100%.  He denies any pain.  He is currently taking oral iron supplements with mild constipation.  He has not needed to take a stool softener.  He has a history of prostate cancer approximately 5 years ago and is currently in remission.  He notes he had radiation for prostate cancer in 12/09/2017, which has lowered his testosterone levels. He denies being given Lupron shots with radiation. He notes he saw Dr. Kathrynn Running at [facility] for prostate cancer. He has  an annual visit with urologist.  Continues to have gait instability related to right PVD status post staph infection and multiple surgeries.  Has significant tightness in right pelvis extending from his hip down to his knee.  Reports stiffness with ambulation.  This is chronic problem.  PAST MEDICAL HISTORY:   Past Medical History: Past Medical History:  Diagnosis Date   Allergy    Anxiety    Arthritis    psoriatic arthritis    Diabetes mellitus    GERD (gastroesophageal reflux disease)    Hyperlipidemia    Hypertension    PAD (peripheral artery disease) (HCC)    a. s/p prior LE stenting 2012, 03/2017 - followed by [facility].   Prostate cancer (HCC)    Sleep apnea 2016   wears CPAP sometimes    Surgical History: Past Surgical History:  Procedure Laterality Date   ABDOMINAL AORTOGRAM N/A 09/01/2016   Procedure: Abdominal Aortogram;  Surgeon: Nada Libman, MD;  [facility];  Service: Cardiovascular;  Laterality: N/A;   ABDOMINAL AORTOGRAM W/LOWER EXTREMITY N/A 03/16/2017   Procedure: ABDOMINAL AORTOGRAM W/LOWER EXTREMITY;  Surgeon: Nada Libman, MD;  [facility]: [facility];  Service: Cardiovascular;  Laterality: N/A;   ANGIOPLASTY Right 01/30/2021   Procedure: BALLOON ANGIOPLASTY;  Surgeon: Nada Libman, MD;  Location: [facility] OR;  Service: Vascular;  Laterality: Right;   ANGIOPLASTY Right 10/15/2021   Procedure: RIGHT SUPERFICIAL FEMORAL ARTERY BALLOON ANGIOPLASTY;  Surgeon: Nada Libman, MD;  Location: [facility] OR;  Service: Vascular;  Laterality: Right;   ANGIOPLASTY / STENTING FEMORAL  11/05/2010   Left SFA stent   AORTOGRAM N/A 10/15/2021   Procedure: ABDOMINAL AORTOGRAM;  Surgeon: Nada Libman, MD;  Location: [facility] OR;  Service: Vascular;  Laterality: N/A;   APPLICATION OF WOUND VAC Right 04/16/2021   Procedure: APPLICATION OF WOUND VAC;  Surgeon: Nada Libman, MD;  Location: [facility] OR;  Service: Vascular;  Laterality: Right;   APPLICATION OF WOUND VAC Right 05/02/2021   Procedure: APPLICATION OF WOUND VAC;  Surgeon: Chuck Hint, MD;  Location: [facility] OR;  Service: Vascular;  Laterality: Right;   APPLICATION OF WOUND VAC Right 05/05/2021   Procedure: WOUND VAC CHANGE RIGHT GROIN, APPLICATION OF WOUND VAC PROXIMAL AND DISTAL MEDIAL RIGHT THIGH;  Surgeon: Leonie Douglas, MD;  Location: [facility] OR;  Service: Vascular;  Laterality: Right;   ENDARTERECTOMY FEMORAL Right 04/16/2021    Procedure: RIGHT FEMORAL ENDARTERECTOMY;  Surgeon: Nada Libman, MD;  [facility]: [facility];  Service: Vascular;  Laterality: Right;   EYE SURGERY  2010   bilateral cataract   FEMORAL-POPLITEAL BYPASS GRAFT Right 04/16/2021   Procedure: RIGHT FEMORAL-POPLITEAL BYPASS USING VEIN;  Surgeon: Nada Libman, MD;  [facility]: [facility];  Service: Vascular;  Laterality: Right;   GROIN DEBRIDEMENT Right 05/05/2021   Procedure: IRRIGATION AND DEBRIDEMENT RIGHT GROIN AND RIGHT THIGH;  Surgeon: Leonie Douglas, MD;  Location: [facility] OR;  Service: Vascular;  Laterality: Right;   I & D EXTREMITY Right 05/02/2021   Procedure: IRRIGATION AND DEBRIDEMENT RIGHT LEG;  Surgeon: Chuck Hint, MD;  Location: [facility] OR;  Service: Vascular;  Laterality: Right;   I & D EXTREMITY Right 10/15/2021   Procedure: IRRIGATION AND DEBRIDEMENT RIGHT LEG;  Surgeon: Nada Libman, MD;  [facility];  Service: Vascular;  Laterality: Right;   KNEE ARTHROSCOPY     LOWER EXTREMITY ANGIOGRAM Right 01/30/2021   Procedure: LOWER EXTREMITY ANGIOGRAM WITH ARTERIOGRAM OF RIGHT LOWER  EXTREMITY;  Surgeon: Nada Libman, MD;  Location: [facility] OR;  Service: Vascular;  Laterality: Right;   LOWER EXTREMITY ANGIOGRAPHY N/A 09/01/2016   Procedure: Lower Extremity Angiography;  Surgeon: Nada Libman, MD;  [facility];  Service: Cardiovascular;  Laterality: N/A;   PERIPHERAL VASCULAR ATHERECTOMY Right 09/01/2016   Procedure: Peripheral Vascular Atherectomy;  Surgeon: Nada Libman, MD;  [facility];  Service: Cardiovascular;  Laterality: Right;  superficial femoral   PERIPHERAL VASCULAR INTERVENTION Right 03/16/2017   Procedure: PERIPHERAL VASCULAR INTERVENTION;  Surgeon: Nada Libman, MD;  [facility];  Service: Cardiovascular;  Laterality: Right;  superficicial femoral   ROTATOR CUFF REPAIR     1990   ROTATOR CUFF REPAIR Bilateral 2017   ULTRASOUND GUIDANCE FOR VASCULAR ACCESS  Left 01/30/2021   Procedure: ULTRASOUND GUIDANCE FOR VASCULAR ACCESS;  Surgeon: Nada Libman, MD;  Location: [facility];  Service: Vascular;  Laterality: Left;   ULTRASOUND GUIDANCE FOR VASCULAR ACCESS Left 10/15/2021   Procedure: ULTRASOUND GUIDANCE FOR VASCULAR ACCESS;  Surgeon: Nada Libman, MD;  Location: [facility];  Service: Vascular;  Laterality: Left;   VEIN HARVEST Right 04/16/2021   Procedure: RIGHT GREATER SAPHENOUS VEIN HARVEST;  Surgeon: Nada Libman, MD;  Location: [facility] OR;  Service: Vascular;  Laterality: Right;    Social History: Social History   Socioeconomic History   Marital status: Married    Spouse name: Not on file   Number of children: Not on file   Years of education: Not on file   Highest education level: Not on file  Occupational History   Not on file  Tobacco Use   Smoking status: Never    Passive exposure: Never   Smokeless tobacco: Former    Quit date: 10/26/1981  Vaping Use   Vaping status: Never Used  Substance and Sexual Activity   Alcohol use: Yes    Comment: occasional drink, maybe a glass of wine once every 6 months   Drug use: No   Sexual activity: Not on file  Other Topics Concern   Not on file  Social History Narrative   Not on file   Social Determinants of Health   Financial Resource Strain: Not on file  Food Insecurity: Not on file  Transportation Needs: Not on file  Physical Activity: Not on file  Stress: Not on file  Social Connections: Not on file  Intimate Partner Violence: Not on file    Family History: Family History  Problem Relation Age of Onset   Hyperlipidemia Mother    Hypertension Mother    Heart disease Father    Deep vein thrombosis Father    Hyperlipidemia Father    Hypertension Father    Heart attack Father    Peripheral vascular disease Father    Cancer Sister     Current Medications:  Current Outpatient Medications:    ACCU-CHEK AVIVA PLUS test strip, AS DIRECTED SEVEN TIMES A DAY IN VITRO 90 DAYS,  Disp: , Rfl: 4   acetaminophen (TYLENOL) 500 MG tablet, Take 1,000 mg by mouth every 6 (six) hours as needed for moderate pain., Disp: , Rfl:    aspirin EC 81 MG tablet, Take 1 tablet (81 mg total) by mouth daily., Disp: , Rfl:    atorvastatin (LIPITOR) 40 MG tablet, Take 40 mg by mouth at bedtime., Disp: , Rfl: 5   benazepril (LOTENSIN) 40 MG tablet, Take 40 mg by mouth daily., Disp: , Rfl:  cefadroxil (DURICEF) 500 MG capsule, Take 2 capsules (1,000 mg total) by mouth 2 (two) times daily., Disp: 56 capsule, Rfl: 0   clonazePAM (KLONOPIN) 1 MG tablet, Take 2 mg by mouth at bedtime as needed (sleep)., Disp: , Rfl:    clopidogrel (PLAVIX) 75 MG tablet, TAKE 1 TABLET BY MOUTH EVERY DAY WITH BREAKFAST, Disp: 90 tablet, Rfl: 3   Continuous Blood Gluc Receiver (FREESTYLE LIBRE 14 DAY READER) DEVI, use to monitor blood sugar, Disp: , Rfl:    dapagliflozin propanediol (FARXIGA) 5 MG TABS tablet, Take 5 mg by mouth daily., Disp: , Rfl:    diphenhydrAMINE (BENADRYL) 25 MG tablet, Take 25 mg by mouth at bedtime as needed for sleep., Disp: , Rfl:    HUMALOG KWIKPEN 100 UNIT/ML KwikPen, Inject 30 Units into the skin 2 (two) times daily., Disp: , Rfl:    hydrochlorothiazide (HYDRODIURIL) 25 MG tablet, Take 1 tablet (25 mg total) by mouth daily. Please make overdue appt with Dr. Eldridge Dace before anymore refills. 2nd attempt, Disp: 15 tablet, Rfl: 0   insulin glargine (LANTUS) 100 UNIT/ML injection, Inject 20 Units into the skin at bedtime., Disp: , Rfl:    metFORMIN (GLUCOPHAGE-XR) 500 MG 24 hr tablet, Take 2,000 mg by mouth at bedtime., Disp: , Rfl:    solifenacin (VESICARE) 5 MG tablet, Take 5 mg by mouth every evening., Disp: , Rfl:    tamsulosin (FLOMAX) 0.4 MG CAPS capsule, Take 0.4 mg by mouth at bedtime., Disp: , Rfl: 5   traZODone (DESYREL) 50 MG tablet, Take 50 mg by mouth at bedtime as needed for sleep., Disp: , Rfl:    trolamine salicylate (ASPERCREME) 10 % cream, Apply 1 Application topically as  needed for muscle pain., Disp: , Rfl:    Allergies: Allergies  Allergen Reactions   Demerol Anaphylaxis   Lactose Intolerance (Gi) Anaphylaxis   Oxycodone Anxiety and Other (See Comments)    Suicidal ideation   Penicillins Anaphylaxis and Other (See Comments)    Tolerates cefepime    Actos [Pioglitazone] Nausea Only   Exenatide Nausea Only and Other (See Comments)   Glimepiride Other (See Comments)    Edema in ankles    Nabumetone Nausea Only and Other (See Comments)   Victoza [Liraglutide] Nausea Only    Shaking   Fentanyl Anxiety    Jittery    Hydrocodone Anxiety    Suicidal ideation    REVIEW OF SYSTEMS:   Review of Systems  Constitutional:  Negative for fatigue.  Gastrointestinal:  Positive for constipation.  Musculoskeletal:  Positive for arthralgias (Right leg and hip).  Neurological:  Positive for numbness.     VITALS:   There were no vitals taken for this visit.  Wt Readings from Last 3 Encounters:  09/24/22 222 lb 10.6 oz (101 kg)  09/11/22 222 lb 6.4 oz (100.9 kg)  08/10/22 224 lb (101.6 kg)    There is no height or weight on file to calculate BMI.   PHYSICAL EXAM:   Physical Exam Constitutional:      Appearance: Normal appearance.  Cardiovascular:     Rate and Rhythm: Normal rate and regular rhythm.  Pulmonary:     Effort: Pulmonary effort is normal.     Breath sounds: Normal breath sounds.  Abdominal:     General: Bowel sounds are normal.     Palpations: Abdomen is soft.  Musculoskeletal:        General: No swelling. Normal range of motion.  Neurological:     Mental  Status: He is alert and oriented to person, place, and time. Mental status is at baseline.     LABS:      Latest Ref Rng & Units 11/09/2022    8:52 AM 09/11/2022    9:07 AM 10/15/2021    7:38 AM  CBC  WBC 4.0 - 10.5 K/uL 7.1  7.0    Hemoglobin 13.0 - 17.0 g/dL 56.2  13.0  86.5   Hematocrit 39.0 - 52.0 % 42.5  35.0  33.0   Platelets 150 - 400 K/uL 216  337        Latest  Ref Rng & Units 09/11/2022    9:07 AM 10/15/2021    7:38 AM 10/10/2021   11:30 AM  CMP  Glucose 70 - 99 mg/dL  784  696   BUN 8 - 23 mg/dL  21  14   Creatinine 2.95 - 1.24 mg/dL  2.84  1.32   Sodium 440 - 145 mmol/L  139  140   Potassium 3.5 - 5.1 mmol/L  4.3  4.3   Chloride 98 - 111 mmol/L  106  103   CO2 22 - 32 mmol/L   27   Calcium 8.9 - 10.3 mg/dL 9.1   9.9      No results found for: "CEA1", "CEA" / No results found for: "CEA1", "CEA" No results found for: "PSA1" No results found for: "CAN199" No results found for: "CAN125"  Lab Results  Component Value Date   TOTALPROTELP 6.6 09/11/2022   TOTALPROTELP 6.7 09/11/2022   ALBUMINELP 3.6 09/11/2022   A1GS 0.2 09/11/2022   A2GS 1.0 09/11/2022   BETS 1.3 09/11/2022   GAMS 0.6 09/11/2022   MSPIKE Not Observed 09/11/2022   SPEI Comment 09/11/2022   Lab Results  Component Value Date   TIBC 330 11/09/2022   TIBC 458 (H) 09/11/2022   FERRITIN 80 11/09/2022   FERRITIN 10 (L) 09/11/2022   IRONPCTSAT 37 11/09/2022   IRONPCTSAT 6 (L) 09/11/2022   Lab Results  Component Value Date   LDH 112 09/11/2022     STUDIES:   No results found.

## 2022-11-20 DIAGNOSIS — Z23 Encounter for immunization: Secondary | ICD-10-CM | POA: Diagnosis not present

## 2022-11-23 DIAGNOSIS — E1165 Type 2 diabetes mellitus with hyperglycemia: Secondary | ICD-10-CM | POA: Diagnosis not present

## 2022-11-23 DIAGNOSIS — M25552 Pain in left hip: Secondary | ICD-10-CM | POA: Diagnosis not present

## 2022-11-23 DIAGNOSIS — M7062 Trochanteric bursitis, left hip: Secondary | ICD-10-CM | POA: Diagnosis not present

## 2022-11-23 DIAGNOSIS — S39013A Strain of muscle, fascia and tendon of pelvis, initial encounter: Secondary | ICD-10-CM | POA: Diagnosis not present

## 2022-12-01 DIAGNOSIS — E119 Type 2 diabetes mellitus without complications: Secondary | ICD-10-CM | POA: Diagnosis not present

## 2022-12-01 DIAGNOSIS — M7062 Trochanteric bursitis, left hip: Secondary | ICD-10-CM | POA: Diagnosis not present

## 2022-12-09 DIAGNOSIS — Z961 Presence of intraocular lens: Secondary | ICD-10-CM | POA: Diagnosis not present

## 2022-12-09 DIAGNOSIS — H43813 Vitreous degeneration, bilateral: Secondary | ICD-10-CM | POA: Diagnosis not present

## 2022-12-09 DIAGNOSIS — H40013 Open angle with borderline findings, low risk, bilateral: Secondary | ICD-10-CM | POA: Diagnosis not present

## 2022-12-09 DIAGNOSIS — E113293 Type 2 diabetes mellitus with mild nonproliferative diabetic retinopathy without macular edema, bilateral: Secondary | ICD-10-CM | POA: Diagnosis not present

## 2022-12-23 DIAGNOSIS — E1165 Type 2 diabetes mellitus with hyperglycemia: Secondary | ICD-10-CM | POA: Diagnosis not present

## 2022-12-24 ENCOUNTER — Telehealth: Payer: Self-pay

## 2022-12-24 DIAGNOSIS — L400 Psoriasis vulgaris: Secondary | ICD-10-CM | POA: Diagnosis not present

## 2022-12-24 DIAGNOSIS — L4050 Arthropathic psoriasis, unspecified: Secondary | ICD-10-CM | POA: Diagnosis not present

## 2022-12-24 DIAGNOSIS — M1991 Primary osteoarthritis, unspecified site: Secondary | ICD-10-CM | POA: Diagnosis not present

## 2022-12-24 DIAGNOSIS — Z79899 Other long term (current) drug therapy: Secondary | ICD-10-CM | POA: Diagnosis not present

## 2022-12-24 NOTE — Telephone Encounter (Signed)
Patient had called in and stated that his right leg had some increased swelling and that his doctor he recently saw stated that he had a "weak" pulse in that foot.  I returned patient's call. He stated that he has had increased swelling for about a week and a half in his right leg. He stated that the doctor he saw today had felt for for a pulse through his sock and said it was "weak" but didn't do a "formal exam. He stated that there is no discoloration or temperature variation in the leg or foot. He has been wearing compression and elevating his leg in his recliner. I reviewed with him the proper elevation of laying flat with his leg elevated on pillows above his heart. He stated he would try this. He denied any rest pain or pain with walking. He stated that he had been doing more walking long distances and thinks this may have contributed to the swelling. I offered to move his appointment up to November 11 and he accepted. I told him to monitor his leg for the symptoms we discussed and to call the office if anything changes before the appointment on the 11th.  Patient was in agreement with this plan.

## 2023-01-04 DIAGNOSIS — E1151 Type 2 diabetes mellitus with diabetic peripheral angiopathy without gangrene: Secondary | ICD-10-CM | POA: Diagnosis not present

## 2023-01-04 DIAGNOSIS — E1142 Type 2 diabetes mellitus with diabetic polyneuropathy: Secondary | ICD-10-CM | POA: Diagnosis not present

## 2023-01-04 DIAGNOSIS — B351 Tinea unguium: Secondary | ICD-10-CM | POA: Diagnosis not present

## 2023-01-04 DIAGNOSIS — M79672 Pain in left foot: Secondary | ICD-10-CM | POA: Diagnosis not present

## 2023-01-04 DIAGNOSIS — L851 Acquired keratosis [keratoderma] palmaris et plantaris: Secondary | ICD-10-CM | POA: Diagnosis not present

## 2023-01-11 ENCOUNTER — Encounter: Payer: Self-pay | Admitting: Physician Assistant

## 2023-01-11 ENCOUNTER — Ambulatory Visit (HOSPITAL_COMMUNITY)
Admission: RE | Admit: 2023-01-11 | Discharge: 2023-01-11 | Disposition: A | Discharge status: Home / Self Care | Payer: Medicare Other | Source: Ambulatory Visit | Attending: Surgery | Admitting: Surgery

## 2023-01-11 ENCOUNTER — Ambulatory Visit (INDEPENDENT_AMBULATORY_CARE_PROVIDER_SITE_OTHER)
Admission: RE | Admit: 2023-01-11 | Discharge: 2023-01-11 | Disposition: A | Discharge status: Home / Self Care | Payer: Medicare Other | Source: Ambulatory Visit | Attending: Surgery | Admitting: Surgery

## 2023-01-11 ENCOUNTER — Ambulatory Visit (INDEPENDENT_AMBULATORY_CARE_PROVIDER_SITE_OTHER): Payer: Medicare Other | Admitting: Physician Assistant

## 2023-01-11 VITALS — BP 103/70 | HR 94 | Temp 97.6°F | Ht 73.0 in | Wt 219.8 lb

## 2023-01-11 DIAGNOSIS — I739 Peripheral vascular disease, unspecified: Secondary | ICD-10-CM | POA: Diagnosis not present

## 2023-01-11 LAB — VAS US ABI WITH/WO TBI
Left ABI: 1.12
Right ABI: 1.03

## 2023-01-11 NOTE — Progress Notes (Signed)
Office Note   History of Present Illness   Michael [name] is a 72 y.o. (03-06-1950) male who presents as a triage visit.  He has a history of several vascular interventions including:  1) right SFA stenting in September 2012 2) right SFA atherectomy and DCBA in July 2018 3) right SFA stenting in January 2019 4) right SFA DCBA in December 2022 5) right femoral to above-knee popliteal artery bypass with saphenous vein in February 2023 6 & 7) irrigation and debridement of right lower extremity vein harvest incisions in March 2023 8) distal femoropopliteal bypass graft DCBA and repeat right lower extremity irrigation and debridement  He was last seen by our office in June.  At that time he had no rest pain or tissue loss.  His right medial thigh wound was almost completely healed.  He returns today as a triage visit.  He called our triage line 2 weeks ago with concern for increased swelling in the right leg.   At our office visit today, he states his right leg swelling is much better.  He feels like his right leg was swelling more a couple of weeks ago because he was exercising more.  He has started to wear compression stockings at night.  He is also elevating his legs daily above his heart.  His right medial thigh wound has completely healed.  He denies any claudication, rest pain, or tissue loss.  Current Outpatient Medications  Medication Sig Dispense Refill   ACCU-CHEK AVIVA PLUS test strip AS DIRECTED SEVEN TIMES A DAY IN VITRO 90 DAYS  4   acetaminophen (TYLENOL) 500 MG tablet Take 1,000 mg by mouth every 6 (six) hours as needed for moderate pain.     Apremilast (OTEZLA) 20 MG TABS Take 20 mg by mouth in the morning and at bedtime.     aspirin EC 81 MG tablet Take 1 tablet (81 mg total) by mouth daily.     atorvastatin (LIPITOR) 40 MG tablet Take 40 mg by mouth at bedtime.  5   benazepril (LOTENSIN) 40 MG tablet Take 40 mg by mouth daily.     cefadroxil (DURICEF) 500 MG capsule  Take 2 capsules (1,000 mg total) by mouth 2 (two) times daily. 56 capsule 0   clonazePAM (KLONOPIN) 1 MG tablet Take 2 mg by mouth at bedtime as needed (sleep).     clopidogrel (PLAVIX) 75 MG tablet TAKE 1 TABLET BY MOUTH EVERY DAY WITH BREAKFAST 90 tablet 3   Continuous Blood Gluc Receiver (FREESTYLE LIBRE 14 DAY READER) DEVI use to monitor blood sugar     dapagliflozin propanediol (FARXIGA) 5 MG TABS tablet Take 5 mg by mouth daily.     diphenhydrAMINE (BENADRYL) 25 MG tablet Take 25 mg by mouth at bedtime as needed for sleep.     HUMALOG KWIKPEN 100 UNIT/ML KwikPen Inject 30 Units into the skin 2 (two) times daily.     hydrochlorothiazide (HYDRODIURIL) 25 MG tablet Take 1 tablet (25 mg total) by mouth daily. Please make overdue appt with Dr. Eldridge Dace before anymore refills. 2nd attempt 15 tablet 0   insulin glargine (LANTUS) 100 UNIT/ML injection Inject 20 Units into the skin at bedtime.     metFORMIN (GLUCOPHAGE-XR) 500 MG 24 hr tablet Take 2,000 mg by mouth at bedtime.     solifenacin (VESICARE) 5 MG tablet Take 5 mg by mouth every evening.     tamsulosin (FLOMAX) 0.4 MG CAPS capsule Take 0.4 mg by mouth at bedtime.  5   traZODone (DESYREL) 50 MG tablet Take 50 mg by mouth at bedtime as needed for sleep.     trolamine salicylate (ASPERCREME) 10 % cream Apply 1 Application topically as needed for muscle pain.     No current facility-administered medications for this visit.    REVIEW OF SYSTEMS (negative unless checked):   Cardiac:  []  Chest pain or chest pressure? []  Shortness of breath upon activity? []  Shortness of breath when lying flat? []  Irregular heart rhythm?  Vascular:  []  Pain in calf, thigh, or hip brought on by walking? []  Pain in feet at night that wakes you up from your sleep? []  Blood clot in your veins? [x]  Leg swelling?  Pulmonary:  []  Oxygen at home? []  Productive cough? []  Wheezing?  Neurologic:  []  Sudden weakness in arms or legs? []  Sudden numbness in  arms or legs? []  Sudden onset of difficult speaking or slurred speech? []  Temporary loss of vision in one eye? []  Problems with dizziness?  Gastrointestinal:  []  Blood in stool? []  Vomited blood?  Genitourinary:  []  Burning when urinating? []  Blood in urine?  Psychiatric:  []  Major depression  Hematologic:  []  Bleeding problems? []  Problems with blood clotting?  Dermatologic:  []  Rashes or ulcers?  Constitutional:  []  Fever or chills?  Ear/Nose/Throat:  []  Change in hearing? []  Nose bleeds? []  Sore throat?  Musculoskeletal:  []  Back pain? []  Joint pain? []  Muscle pain?   Physical Examination   Vitals:   01/11/23 1112  BP: 103/70  Pulse: 94  Temp: 97.6 F (36.4 C)  TempSrc: Temporal  SpO2: 96%  Weight: 219 lb 12.8 oz (99.7 kg)  Height: 6\' 1"  (1.854 m)   Body mass index is 29 kg/m.  General:  WDWN in NAD; vital signs documented above Gait: Not observed HENT: WNL, normocephalic Pulmonary: normal non-labored breathing , without rales, rhonchi,  wheezing Cardiac: regular Abdomen: soft, NT, no masses Skin: without rashes Vascular Exam/Pulses: 2+ right DP pulse Extremities: without ischemic changes, without gangrene , without cellulitis; without open wounds; healed right thigh wounds.  Trace right lower extremity edema Musculoskeletal: no muscle wasting or atrophy  Neurologic: A&O X 3;  No focal weakness or paresthesias are detected Psychiatric:  The pt has Normal affect.  Non-Invasive Vascular imaging   ABI (01/11/2023) +-------+-----------+-----------+------------+------------+  ABI/TBIToday's ABIToday's TBIPrevious ABIPrevious TBI  +-------+-----------+-----------+------------+------------+  Right 1.03       0.73       1.09        0.78          +-------+-----------+-----------+------------+------------+  Left  1.12       0.82       1.22        1.03          +-------+-----------+-----------+------------+------------+    RLE  Bypass Duplex (01/11/2023) Right Graft #1: CFA to AK pop bypass  +------------------+--------+--------+---------+--------+                   PSV cm/sStenosisWaveform Comments  +------------------+--------+--------+---------+--------+  Inflow           67              biphasic           +------------------+--------+--------+---------+--------+  Prox Anastomosis  56              biphasic           +------------------+--------+--------+---------+--------+  Proximal Graft    64  biphasic           +------------------+--------+--------+---------+--------+  Mid Graft         192             triphasic          +------------------+--------+--------+---------+--------+  Distal Graft      120             triphasic          +------------------+--------+--------+---------+--------+  Distal Anastomosis142             triphasic          +------------------+--------+--------+---------+--------+  Outflow          51              triphasic          +------------------+--------+--------+---------+--------+    Medical Decision Making   Michael [name] is a 72 y.o. male who presents for right lower extremity swelling  Based on the patient's vascular studies, his ABIs are essentially unchanged.  His right ABI is 1.03 and left ABI is 1.12 Duplex of his right lower extremity bypass demonstrates a patent bypass without stenosis.  He has a patent distal bypass stent A couple of weeks ago the patient was dealing with an increase in right lower extremity swelling.  This was likely due to a significant increase in exercise.  His swelling has been effectively treated by leg elevation and use of compression stockings.  He has trace edema on today's exam He has a 2+ right DP pulse.  He denies any rest pain, claudication, or tissue loss I have encouraged he elevate his legs as needed for leg swelling.  I have also encouraged that he wear his compression  stockings during the day because he is up and moving around. He can follow-up with our office in 9 months with repeat ABIs and right lower extremity bypass graft duplex   Loel Dubonnet PA-C [facility] Office: [phone]  Clinic MD: Myra Gianotti

## 2023-01-12 DIAGNOSIS — Z794 Long term (current) use of insulin: Secondary | ICD-10-CM | POA: Diagnosis not present

## 2023-01-12 DIAGNOSIS — Z8546 Personal history of malignant neoplasm of prostate: Secondary | ICD-10-CM | POA: Diagnosis not present

## 2023-01-12 DIAGNOSIS — E1151 Type 2 diabetes mellitus with diabetic peripheral angiopathy without gangrene: Secondary | ICD-10-CM | POA: Diagnosis not present

## 2023-01-22 ENCOUNTER — Other Ambulatory Visit: Payer: Self-pay

## 2023-01-22 DIAGNOSIS — E1165 Type 2 diabetes mellitus with hyperglycemia: Secondary | ICD-10-CM | POA: Diagnosis not present

## 2023-01-22 DIAGNOSIS — I739 Peripheral vascular disease, unspecified: Secondary | ICD-10-CM

## 2023-01-25 ENCOUNTER — Other Ambulatory Visit: Payer: Self-pay

## 2023-01-25 DIAGNOSIS — D509 Iron deficiency anemia, unspecified: Secondary | ICD-10-CM

## 2023-01-26 ENCOUNTER — Inpatient Hospital Stay: Payer: Medicare Other | Attending: Hematology

## 2023-01-26 ENCOUNTER — Inpatient Hospital Stay: Payer: Medicare Other

## 2023-01-26 DIAGNOSIS — D509 Iron deficiency anemia, unspecified: Secondary | ICD-10-CM | POA: Diagnosis not present

## 2023-01-26 LAB — CBC WITH DIFFERENTIAL/PLATELET
Abs Immature Granulocytes: 0.03 10*3/uL (ref 0.00–0.07)
Basophils Absolute: 0 10*3/uL (ref 0.0–0.1)
Basophils Relative: 0 %
Eosinophils Absolute: 0.1 10*3/uL (ref 0.0–0.5)
Eosinophils Relative: 2 %
HCT: 42 % (ref 39.0–52.0)
Hemoglobin: 13.2 g/dL (ref 13.0–17.0)
Immature Granulocytes: 0 %
Lymphocytes Relative: 27 %
Lymphs Abs: 1.8 10*3/uL (ref 0.7–4.0)
MCH: 32.4 pg (ref 26.0–34.0)
MCHC: 31.4 g/dL (ref 30.0–36.0)
MCV: 103.2 fL — ABNORMAL HIGH (ref 80.0–100.0)
Monocytes Absolute: 0.4 10*3/uL (ref 0.1–1.0)
Monocytes Relative: 6 %
Neutro Abs: 4.3 10*3/uL (ref 1.7–7.7)
Neutrophils Relative %: 65 %
Platelets: 221 10*3/uL (ref 150–400)
RBC: 4.07 MIL/uL — ABNORMAL LOW (ref 4.22–5.81)
RDW: 13.8 % (ref 11.5–15.5)
WBC: 6.7 10*3/uL (ref 4.0–10.5)
nRBC: 0 % (ref 0.0–0.2)

## 2023-01-26 LAB — IRON AND TIBC
Iron: 90 ug/dL (ref 45–182)
Saturation Ratios: 25 % (ref 17.9–39.5)
TIBC: 363 ug/dL (ref 250–450)
UIBC: 273 ug/dL

## 2023-01-26 LAB — FERRITIN: Ferritin: 35 ng/mL (ref 24–336)

## 2023-02-01 DIAGNOSIS — M25551 Pain in right hip: Secondary | ICD-10-CM | POA: Diagnosis not present

## 2023-02-01 DIAGNOSIS — M5459 Other low back pain: Secondary | ICD-10-CM | POA: Diagnosis not present

## 2023-02-01 DIAGNOSIS — M79661 Pain in right lower leg: Secondary | ICD-10-CM | POA: Diagnosis not present

## 2023-02-04 ENCOUNTER — Inpatient Hospital Stay: Payer: Medicare Other | Attending: Hematology | Admitting: Oncology

## 2023-02-04 VITALS — BP 93/82 | HR 94 | Temp 97.7°F | Resp 17 | Wt 224.2 lb

## 2023-02-04 DIAGNOSIS — D509 Iron deficiency anemia, unspecified: Secondary | ICD-10-CM | POA: Insufficient documentation

## 2023-02-04 DIAGNOSIS — Z8546 Personal history of malignant neoplasm of prostate: Secondary | ICD-10-CM | POA: Diagnosis not present

## 2023-02-04 NOTE — Progress Notes (Signed)
[facility] [address], [place] [address]   Clinic Day:  02/04/23   Referring physician: Genia Hotter, FNP  Patient Care Team: Stamey, Verda Cumins, FNP as PCP - General (Family Medicine) Corky Crafts, MD as PCP - Cardiology (Cardiology) Corky Crafts, MD as Attending Physician (Cardiology) Ernesto Rutherford, MD as Consulting Physician (Ophthalmology) Talmage Coin, MD as Consulting Physician (Endocrinology) Doreatha Massed, MD as Medical Oncologist (Hematology)   ASSESSMENT & PLAN:   Assessment:  1.  Normocytic anemia: - Labs at [place] on 08/28/2022: Hb-9.6, MCV-88, WBC-5.1, PLT-347, creatinine-1.02, calcium 10.6.  Hemoglobin was 8.5 few weeks prior to that.  He started taking iron tablet 2 weeks ago.  Reports constipation. - Reports fatigue for 5 years. - Last colonoscopy 3 years ago.  Repeat colonoscopy planned on 09/25/2022. - Stool for occult blood checked -3 weeks ago at [facility] physicians.  2.  Social/family history: - Lives at home with his wife.  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  Non-smoker.  Chewed tobacco for few years and quit in Feb 17, 1976. - Sister died of Hodgkin's disease at age 16 in 23.  68.  T2a prostate cancer: - Gleason 3+4=7, PSA 7.54. - XRT 70 Gray in 28 fractions from 04/11/2018 - 05/18/2018 - PSA less than 0.1  Plan:  1.  Normocytic anemia: - Secondary to iron deficiency s/p 2 doses of IV iron. -Per patient had negative colonoscopy 4 months ago with Dr. Levora Angel with [facility].  - No other nutritional deficiencies.  -No evidence of MPN.  No evidence of hemolysis. -He received 2 doses of IV Feraheme on 09/29/2022 and on 10/06/2022.  Tolerated them well. -Repeat labs from 01/26/2023 showed ferritin 35, iron saturation 25%, hemoglobin 13.2, MCV 103.2 with normal differential. - Continue iron tablets.  We discussed trying to maintain with oral iron given he denies any bleeding and increase iron  in his diet. -Recommend return to clinic in 12 weeks for follow-up with labs a few days before.  PLAN SUMMARY: >> No additional IV iron needed at this time. >> Continue oral iron.  Increase iron in your diet. >> Follow-up in 12 weeks with labs a few days before.    I spent 25 minutes dedicated to the care of this patient (face-to-face and non-face-to-face) on the date of the encounter to include what is described in the assessment and plan.  No orders of the defined types were placed in this encounter.   Mauro Kaufmann, NP   12/5/20243:02 PM  CHIEF COMPLAINT/PURPOSE OF CONSULT:   Diagnosis: anemia  Current Therapy:  iron pills and intermittent IV Feraheme.  HISTORY OF PRESENT ILLNESS:  Mr. State is a 72 year old male with past medical history difficult for hypertension, PVD D, PAD, diabetes type 2, lumbar radiculopathy, prostate cancer and iron deficiency anemia.  He was last seen in clinic on 11/12/22 my me.  He received 2 doses of IV Feraheme on 09/29/2022 and 10/06/2022.   He denies any interval hospitalizations, surgeries or changes to his baseline health.  Denies any bleeding, hematochezia or melena.  Taking B12 supplements.  He is taking oral iron supplements with fair tolerance.  Has occasional constipation.  Arthritis in both hips. Saw ortho. On Tramadol which is not helping. Causes diarrhea.   Feels like his blood pressure has been running a little low.  On ozempic. Makes his nauseated and causes diarrhea.  He has a history of prostate cancer approximately 5 years ago and  is currently in remission.  He notes he had radiation for prostate cancer in 2019, which has lowered his testosterone levels. He denies being given Lupron shots with radiation. He notes he saw Dr. Kathrynn [facility] at [facility] for [facility]. He has an annual visit with urologist.  Continues to have gait instability related to right PVD status post staph infection and multiple surgeries.  Has  significant tightness in right pelvis extending from his hip down to his knee.  Reports stiffness with ambulation.  This is chronic problem.    PAST MEDICAL HISTORY:   Past Medical History: Past Medical History:  Diagnosis Date   Allergy    Anxiety    Arthritis    psoriatic arthritis   Diabetes mellitus    GERD (gastroesophageal reflux disease)    Hyperlipidemia    Hypertension    PAD (peripheral artery disease) (HCC)    a. s/p prior LE stenting 2012, 03/2017 - followed by [facility].   Peripheral arterial disease (HCC)    Prostate cancer (HCC)    Sleep apnea 2016   wears CPAP sometimes    Surgical History: Past Surgical History:  Procedure Laterality Date   ABDOMINAL AORTOGRAM N/A 09/01/2016   Procedure: Abdominal Aortogram;  Surgeon: Nada Libman, MD;  [facility];  Service: Cardiovascular;  Laterality: N/A;   ABDOMINAL AORTOGRAM W/LOWER EXTREMITY N/A 03/16/2017   Procedure: ABDOMINAL AORTOGRAM W/LOWER EXTREMITY;  Surgeon: Nada Libman, MD;  [facility]: [facility];  Service: Cardiovascular;  Laterality: N/A;   ANGIOPLASTY Right 01/30/2021   Procedure: BALLOON ANGIOPLASTY;  Surgeon: Nada Libman, MD;  Location: [facility] OR;  Service: Vascular;  Laterality: Right;   ANGIOPLASTY Right 10/15/2021   Procedure: RIGHT SUPERFICIAL FEMORAL ARTERY BALLOON ANGIOPLASTY;  Surgeon: Nada Libman, MD;  Location: [facility];  Service: Vascular;  Laterality: Right;   ANGIOPLASTY / STENTING FEMORAL  11/05/2010   Left SFA stent   AORTOGRAM N/A 10/15/2021   Procedure: ABDOMINAL AORTOGRAM;  Surgeon: Nada Libman, MD;  [facility]: [facility];  Service: Vascular;  Laterality: N/A;   APPLICATION OF WOUND VAC Right 04/16/2021   Procedure: APPLICATION OF WOUND VAC;  Surgeon: Nada Libman, MD;  Location: [facility];  Service: Vascular;  Laterality: Right;   APPLICATION OF WOUND VAC Right 05/02/2021   Procedure: APPLICATION OF WOUND VAC;  Surgeon: Chuck Hint, MD;  Location:  [facility] OR;  Service: Vascular;  Laterality: Right;   APPLICATION OF WOUND VAC Right 05/05/2021   Procedure: WOUND VAC CHANGE RIGHT GROIN, APPLICATION OF WOUND VAC PROXIMAL AND DISTAL MEDIAL RIGHT THIGH;  Surgeon: Leonie Douglas, MD;  Location: [facility];  Service: Vascular;  Laterality: Right;   ENDARTERECTOMY FEMORAL Right 04/16/2021   Procedure: RIGHT FEMORAL ENDARTERECTOMY;  Surgeon: Nada Libman, MD;  [facility]: [facility];  Service: Vascular;  Laterality: Right;   EYE SURGERY  2010   bilateral cataract   FEMORAL-POPLITEAL BYPASS GRAFT Right 04/16/2021   Procedure: RIGHT FEMORAL-POPLITEAL BYPASS USING VEIN;  Surgeon: Nada Libman, MD;  [facility];  Service: Vascular;  Laterality: Right;   GROIN DEBRIDEMENT Right 05/05/2021   Procedure: IRRIGATION AND DEBRIDEMENT RIGHT GROIN AND RIGHT THIGH;  Surgeon: Leonie Douglas, MD;  Location: [facility];  Service: Vascular;  Laterality: Right;   I & D EXTREMITY Right 05/02/2021   Procedure: IRRIGATION AND DEBRIDEMENT RIGHT LEG;  Surgeon: Chuck Hint, MD;  Location: [facility] OR;  Service: Vascular;  Laterality: Right;   I &  D EXTREMITY Right 10/15/2021   Procedure: IRRIGATION AND DEBRIDEMENT RIGHT LEG;  Surgeon: Nada Libman, MD;  Location: [facility] OR;  Service: Vascular;  Laterality: Right;   KNEE ARTHROSCOPY     LOWER EXTREMITY ANGIOGRAM Right 01/30/2021   Procedure: LOWER EXTREMITY ANGIOGRAM WITH ARTERIOGRAM OF RIGHT LOWER EXTREMITY;  Surgeon: Nada Libman, MD;  [facility];  Service: Vascular;  Laterality: Right;   LOWER EXTREMITY ANGIOGRAPHY N/A 09/01/2016   Procedure: Lower Extremity Angiography;  Surgeon: Nada Libman, MD;  [facility];  Service: Cardiovascular;  Laterality: N/A;   PERIPHERAL VASCULAR ATHERECTOMY Right 09/01/2016   Procedure: Peripheral Vascular Atherectomy;  Surgeon: Nada Libman, MD;  [facility];  Service: Cardiovascular;  Laterality: Right;  superficial femoral   PERIPHERAL  VASCULAR INTERVENTION Right 03/16/2017   Procedure: PERIPHERAL VASCULAR INTERVENTION;  Surgeon: Nada Libman, MD;  [facility];  Service: Cardiovascular;  Laterality: Right;  superficicial femoral   ROTATOR CUFF REPAIR     1990   ROTATOR CUFF REPAIR Bilateral 2017   ULTRASOUND GUIDANCE FOR VASCULAR ACCESS Left 01/30/2021   Procedure: ULTRASOUND GUIDANCE FOR VASCULAR ACCESS;  Surgeon: Nada Libman, MD;  [facility];  Service: Vascular;  Laterality: Left;   ULTRASOUND GUIDANCE FOR VASCULAR ACCESS Left 10/15/2021   Procedure: ULTRASOUND GUIDANCE FOR VASCULAR ACCESS;  Surgeon: Nada Libman, MD;  [facility];  Service: Vascular;  Laterality: Left;   VEIN HARVEST Right 04/16/2021   Procedure: RIGHT GREATER SAPHENOUS VEIN HARVEST;  Surgeon: Nada Libman, MD;  Location: [facility];  Service: Vascular;  Laterality: Right;    Social History: Social History   Socioeconomic History   Marital status: Married    Spouse name: Not on file   Number of children: Not on file   Years of education: Not on file   Highest education level: Not on file  Occupational History   Not on file  Tobacco Use   Smoking status: Never    Passive exposure: Never   Smokeless tobacco: Former    Quit date: 10/26/1981  Vaping Use   Vaping status: Never Used  Substance and Sexual Activity   Alcohol use: Yes    Comment: occasional drink, maybe a glass of wine once every 6 months   Drug use: No   Sexual activity: Not on file  Other Topics Concern   Not on file  Social History Narrative   Not on file   Social Determinants of Health   Financial Resource Strain: Not on file  Food Insecurity: Not on file  Transportation Needs: Not on file  Physical Activity: Not on file  Stress: Not on file  Social Connections: Not on file  Intimate Partner Violence: Not on file    Family History: Family History  Problem Relation Age of Onset   Hyperlipidemia Mother    Hypertension Mother     Heart disease Father    Deep vein thrombosis Father    Hyperlipidemia Father    Hypertension Father    Heart attack Father    Peripheral vascular disease Father    Cancer Sister     Current Medications:  Current Outpatient Medications:    ACCU-CHEK AVIVA PLUS test strip, AS DIRECTED SEVEN TIMES A DAY IN VITRO 90 DAYS, Disp: , Rfl: 4   acetaminophen (TYLENOL) 500 MG tablet, Take 1,000 mg by mouth every 6 (six) hours as needed for moderate pain., Disp: , Rfl:  Apremilast (OTEZLA) 20 MG TABS, Take 20 mg by mouth in the morning and at bedtime., Disp: , Rfl:    aspirin EC 81 MG tablet, Take 1 tablet (81 mg total) by mouth daily., Disp: , Rfl:    atorvastatin (LIPITOR) 40 MG tablet, Take 40 mg by mouth at bedtime., Disp: , Rfl: 5   benazepril (LOTENSIN) 40 MG tablet, Take 40 mg by mouth daily., Disp: , Rfl:    cefadroxil (DURICEF) 500 MG capsule, Take 2 capsules (1,000 mg total) by mouth 2 (two) times daily., Disp: 56 capsule, Rfl: 0   clonazePAM (KLONOPIN) 1 MG tablet, Take 2 mg by mouth at bedtime as needed (sleep)., Disp: , Rfl:    clopidogrel (PLAVIX) 75 MG tablet, TAKE 1 TABLET BY MOUTH EVERY DAY WITH BREAKFAST, Disp: 90 tablet, Rfl: 3   Continuous Blood Gluc Receiver (FREESTYLE LIBRE 14 DAY READER) DEVI, use to monitor blood sugar, Disp: , Rfl:    dapagliflozin propanediol (FARXIGA) 5 MG TABS tablet, Take 5 mg by mouth daily., Disp: , Rfl:    diphenhydrAMINE (BENADRYL) 25 MG tablet, Take 25 mg by mouth at bedtime as needed for sleep., Disp: , Rfl:    HUMALOG KWIKPEN 100 UNIT/ML KwikPen, Inject 30 Units into the skin 2 (two) times daily., Disp: , Rfl:    hydrochlorothiazide (HYDRODIURIL) 25 MG tablet, Take 1 tablet (25 mg total) by mouth daily. Please make overdue appt with Dr. Eldridge Dace before anymore refills. 2nd attempt, Disp: 15 tablet, Rfl: 0   insulin glargine (LANTUS) 100 UNIT/ML injection, Inject 20 Units into the skin at bedtime., Disp: , Rfl:    metFORMIN (GLUCOPHAGE-XR) 500  MG 24 hr tablet, Take 2,000 mg by mouth at bedtime., Disp: , Rfl:    solifenacin (VESICARE) 5 MG tablet, Take 5 mg by mouth every evening., Disp: , Rfl:    tamsulosin (FLOMAX) 0.4 MG CAPS capsule, Take 0.4 mg by mouth at bedtime., Disp: , Rfl: 5   traZODone (DESYREL) 50 MG tablet, Take 50 mg by mouth at bedtime as needed for sleep., Disp: , Rfl:    trolamine salicylate (ASPERCREME) 10 % cream, Apply 1 Application topically as needed for muscle pain., Disp: , Rfl:    Allergies: Allergies  Allergen Reactions   Demerol Anaphylaxis   Lactose Intolerance (Gi) Anaphylaxis   Oxycodone Anxiety and Other (See Comments)    Suicidal ideation   Penicillins Anaphylaxis and Other (See Comments)    Tolerates cefepime    Actos [Pioglitazone] Nausea Only   Exenatide Nausea Only and Other (See Comments)   Glimepiride Other (See Comments)    Edema in ankles    Nabumetone Nausea Only and Other (See Comments)   Victoza [Liraglutide] Nausea Only    Shaking   Fentanyl Anxiety    Jittery    Hydrocodone Anxiety    Suicidal ideation    REVIEW OF SYSTEMS:   Review of Systems  Gastrointestinal:  Positive for diarrhea.  Neurological:  Positive for numbness.     VITALS:   There were no vitals taken for this visit.  Wt Readings from Last 3 Encounters:  01/11/23 219 lb 12.8 oz (99.7 kg)  11/12/22 222 lb 10.6 oz (101 kg)  09/24/22 222 lb 10.6 oz (101 kg)    There is no height or weight on file to calculate BMI.   PHYSICAL EXAM:   Physical Exam Constitutional:      Appearance: Normal appearance.  Cardiovascular:     Rate and Rhythm: Normal rate and regular  rhythm.  Pulmonary:     Effort: Pulmonary effort is normal.     Breath sounds: Normal breath sounds.  Abdominal:     General: Bowel sounds are normal.     Palpations: Abdomen is soft.  Musculoskeletal:        General: No swelling. Normal range of motion.  Neurological:     Mental Status: He is alert and oriented to person, place, and  time. Mental status is at baseline.     LABS:      Latest Ref Rng & Units 01/26/2023    7:58 AM 11/09/2022    8:52 AM 09/11/2022    9:07 AM  CBC  WBC 4.0 - 10.5 K/uL 6.7  7.1  7.0   Hemoglobin 13.0 - 17.0 g/dL 01.6  01.0  93.2   Hematocrit 39.0 - 52.0 % 42.0  42.5  35.0   Platelets 150 - 400 K/uL 221  216  337       Latest Ref Rng & Units 09/11/2022    9:07 AM 10/15/2021    7:38 AM 10/10/2021   11:30 AM  CMP  Glucose 70 - 99 mg/dL  355  732   BUN 8 - 23 mg/dL  21  14   Creatinine 2.02 - 1.24 mg/dL  5.42  7.06   Sodium 237 - 145 mmol/L  139  140   Potassium 3.5 - 5.1 mmol/L  4.3  4.3   Chloride 98 - 111 mmol/L  106  103   CO2 22 - 32 mmol/L   27   Calcium 8.9 - 10.3 mg/dL 9.1   9.9      No results found for: "CEA1", "CEA" / No results found for: "CEA1", "CEA" No results found for: "PSA1" No results found for: "CAN199" No results found for: "CAN125"  Lab Results  Component Value Date   TOTALPROTELP 6.6 09/11/2022   TOTALPROTELP 6.7 09/11/2022   ALBUMINELP 3.6 09/11/2022   A1GS 0.2 09/11/2022   A2GS 1.0 09/11/2022   BETS 1.3 09/11/2022   GAMS 0.6 09/11/2022   MSPIKE Not Observed 09/11/2022   SPEI Comment 09/11/2022   Lab Results  Component Value Date   TIBC 363 01/26/2023   TIBC 330 11/09/2022   TIBC 458 (H) 09/11/2022   FERRITIN 35 01/26/2023   FERRITIN 80 11/09/2022   FERRITIN 10 (L) 09/11/2022   IRONPCTSAT 25 01/26/2023   IRONPCTSAT 37 11/09/2022   IRONPCTSAT 6 (L) 09/11/2022   Lab Results  Component Value Date   LDH 112 09/11/2022     STUDIES:   VAS [place] LOWER EXTREMITY BYPASS GRAFT DUPLEX  Result Date: 01/11/2023 LOWER EXTREMITY ARTERIAL DUPLEX STUDY Patient Name:  GABERIAL BOIS  Date of Exam:   01/11/2023 Medical Rec #: [id number]         Accession #:    [id number] Date of Birth: 1950-08-07         Patient Gender: M Patient Age:   40 years Exam Location:  [facility] Procedure:      VAS [place] LOWER EXTREMITY BYPASS GRAFT DUPLEX  Referring Phys: Coral Else --------------------------------------------------------------------------------   Current ABI: Right 1.03/0.73 Left 1.12/0.82 Comparison Study: 08/10/22: Patent graft with no stenosis. Performing Technologist: Thereasa Parkin RVT  Examination Guidelines: A complete evaluation includes B-mode imaging, spectral Doppler, color Doppler, and power Doppler as needed of all accessible portions of each vessel. Bilateral testing is considered an integral part of a complete examination. Limited examinations for reoccurring indications may be performed as  noted.   Right Graft #1: CFA to AK pop bypass +------------------+--------+--------+---------+--------+                   PSV cm/sStenosisWaveform Comments +------------------+--------+--------+---------+--------+ Inflow            67              biphasic          +------------------+--------+--------+---------+--------+ Prox Anastomosis  56              biphasic          +------------------+--------+--------+---------+--------+ Proximal Graft    64              biphasic          +------------------+--------+--------+---------+--------+ Mid Graft         192             triphasic         +------------------+--------+--------+---------+--------+ Distal Graft      120             triphasic         +------------------+--------+--------+---------+--------+ Distal Anastomosis142             triphasic         +------------------+--------+--------+---------+--------+ Outflow           51              triphasic         +------------------+--------+--------+---------+--------+   Summary: Right: Patent stent with no significant stenosis.  See table(s) above for measurements and observations. Electronically signed by Coral Else MD on 01/11/2023 at 2:09:02 PM.    Final    VAS [place] ABI WITH/WO TBI  Result Date: 01/11/2023  LOWER EXTREMITY DOPPLER STUDY Patient Name:  JADENCE BUTTERLY  Date of Exam:    01/11/2023 Medical Rec #: [id number]         Accession #:    [id number] Date of Birth: [birth year]         Patient Gender: M Patient Age:   69 years Exam Location:  [facility] Procedure:      VAS [place] ABI WITH/WO TBI Referring Phys: Coral Else --------------------------------------------------------------------------------  Indications: Peripheral artery disease. High Risk Factors: Hypertension, hyperlipidemia, Diabetes.  Vascular Interventions: 04/16/21: Right femoral to AK popliteal bypass with                         non-reversed ipsilateral saphenous vein.                         Right common femoral and profunda femoral                         endarterectomy.                         Multiple previous right interventions. Performing Technologist: Thereasa Parkin RVT  Examination Guidelines: A complete evaluation includes at minimum, Doppler waveform signals and systolic blood pressure reading at the level of bilateral brachial, anterior tibial, and posterior tibial arteries, when vessel segments are accessible. Bilateral testing is considered an integral part of a complete examination. Photoelectric Plethysmograph (PPG) waveforms and toe systolic pressure readings are included as required and additional duplex testing as needed. Limited examinations for reoccurring indications may be performed as noted.  ABI Findings: +---------+------------------+-----+----------+--------+ Right    Rt Pressure (mmHg)IndexWaveform  Comment  +---------+------------------+-----+----------+--------+ Brachial 139                                       +---------+------------------+-----+----------+--------+ ATA      122               0.88 monophasic         +---------+------------------+-----+----------+--------+ PTA      143               1.03 triphasic          +---------+------------------+-----+----------+--------+ Great Toe101               0.73                     +---------+------------------+-----+----------+--------+ +---------+------------------+-----+---------+-------+ Left     Lt Pressure (mmHg)IndexWaveform Comment +---------+------------------+-----+---------+-------+ Brachial 136                                     +---------+------------------+-----+---------+-------+ ATA      152               1.09 triphasic        +---------+------------------+-----+---------+-------+ PTA      156               1.12 triphasic        +---------+------------------+-----+---------+-------+ Great Toe114               0.82                  +---------+------------------+-----+---------+-------+ +-------+-----------+-----------+------------+------------+ ABI/TBIToday's ABIToday's TBIPrevious ABIPrevious TBI +-------+-----------+-----------+------------+------------+ Right  1.03       0.73       1.09        0.78         +-------+-----------+-----------+------------+------------+ Left   1.12       0.82       1.22        1.03         +-------+-----------+-----------+------------+------------+  Previous ABI on 08/10/22.  Summary: Right: Resting right ankle-brachial index is within normal range. The right toe-brachial index is normal. Left: Resting left ankle-brachial index is within normal range. The left toe-brachial index is normal. *See table(s) above for measurements and observations.  Electronically signed by Coral Else MD on 01/11/2023 at 2:08:17 PM.    Final

## 2023-02-11 IMAGING — CT CT FEMUR *R* W/O CM
3 series · 10 of 33 positions shown, 11 images · non-contrast
Comparison: CT AP, 12/30/2018.  Hip XRs, 06/05/2010.

CLINICAL DATA: 71 y.o. male with history of right leg claudication
with failed percutaneous intervention. On 04/16/2021 he underwent
right femoral to above-knee popliteal artery bypass graft with
saphenous vein.

Eval for fluid post bypass
EXAM:
CT OF THE LOWER RIGHT EXTREMITY WITHOUT CONTRAST
TECHNIQUE: Multidetector CT imaging of the right lower extremity was performed
according to the standard protocol.
RADIATION DOSE REDUCTION: This exam was performed according to the
departmental dose-optimization program which includes automated
exposure control, adjustment of the mA and/or kV according to
patient size and/or use of iterative reconstruction technique.

[Series 6: extremity soft tissue · axial · 0.45mm/px · z∈[+532,+810]mm · 2 of 302 slices shown, 3 images]
[im 93/302  soft-tissue]
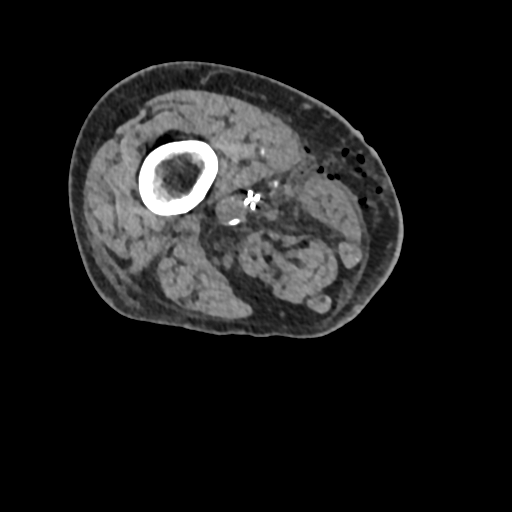
[im 93/302  bone]
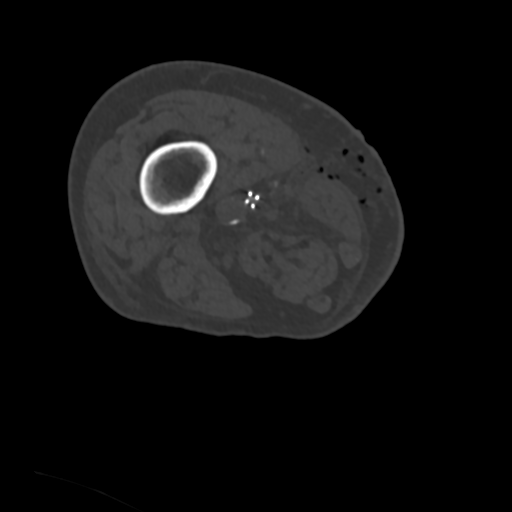
[im 232/302  bone]
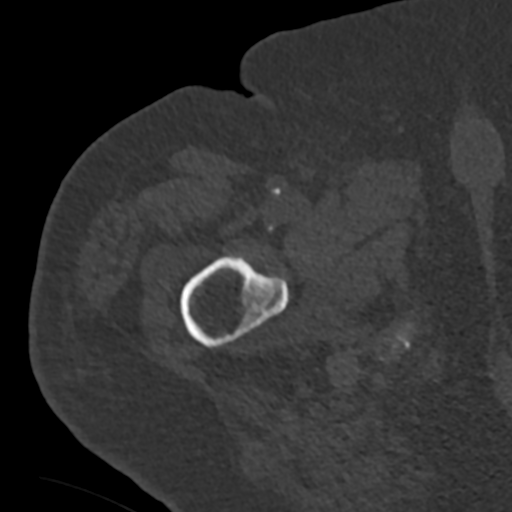

[Series 10: cor soft tissue · coronal · 0.54mm/px · 3 of 162 slices shown]
[im 33/162  bone]
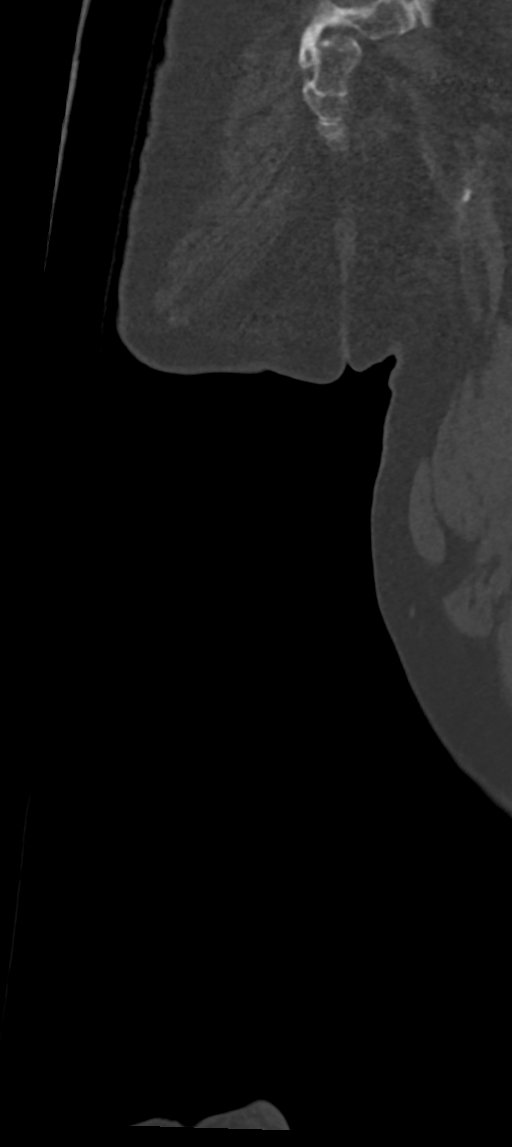
[im 65/162  bone]
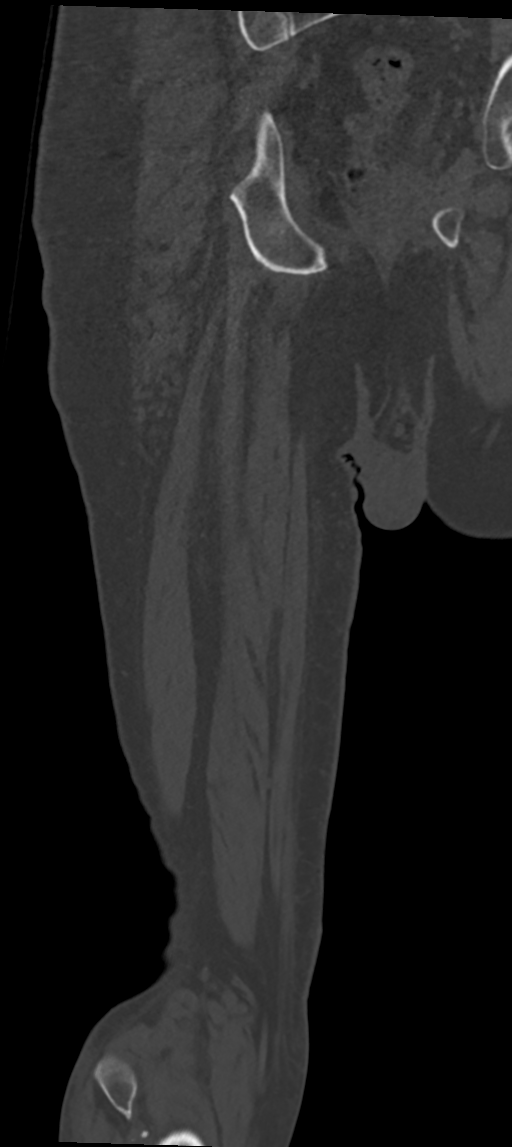
[im 97/162  bone]
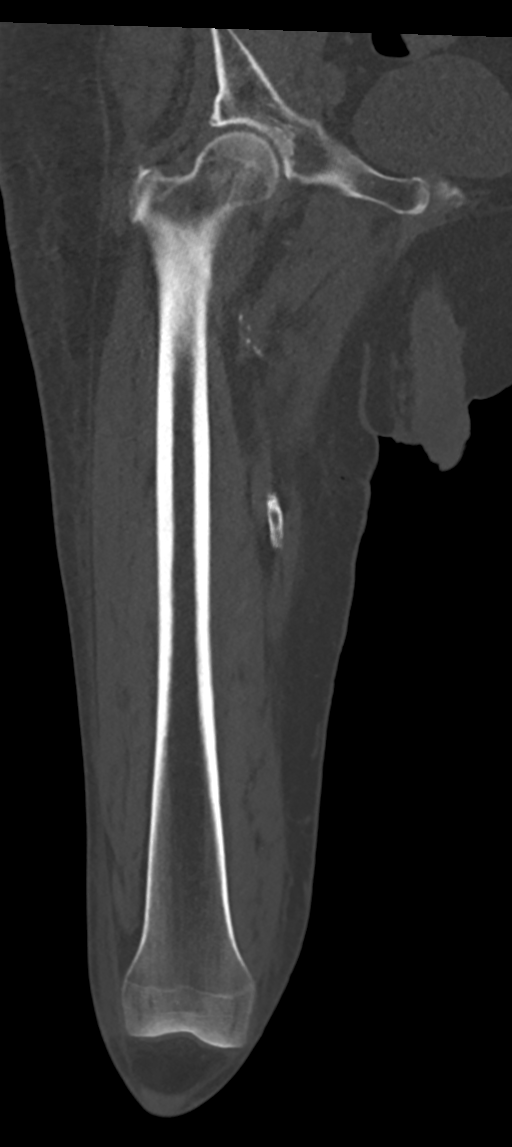

[Series 11: sag soft tissue · sagittal · 0.63mm/px · 5 of 138 slices shown]
[im 46/138  bone]
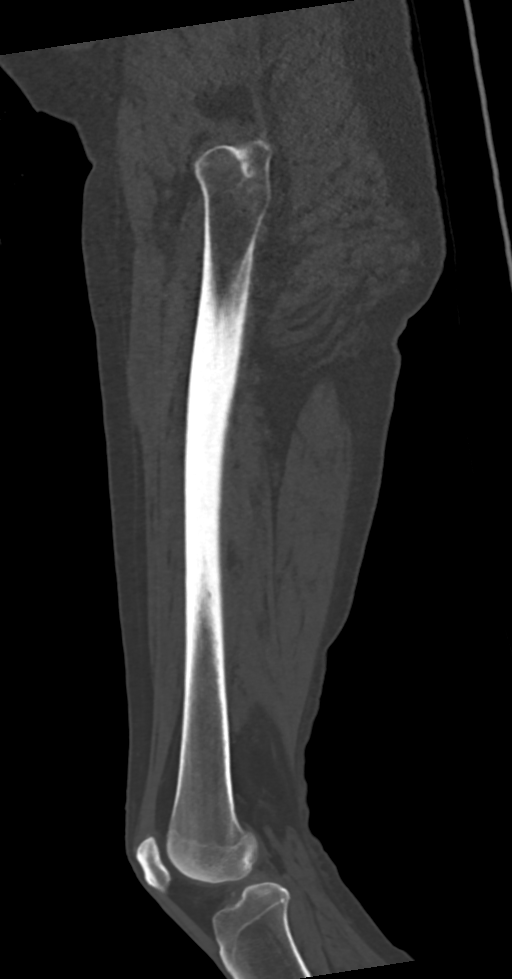
[im 58/138  bone]
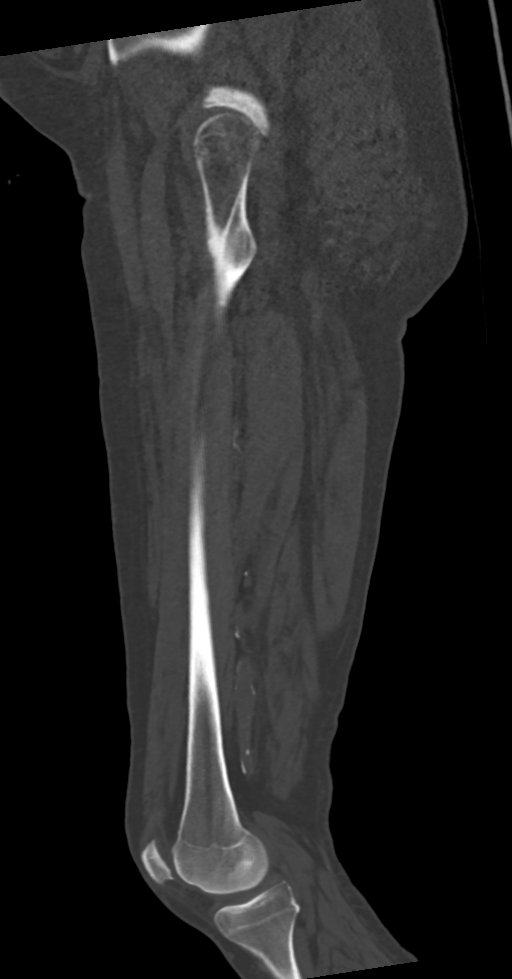
[im 69/138  bone]
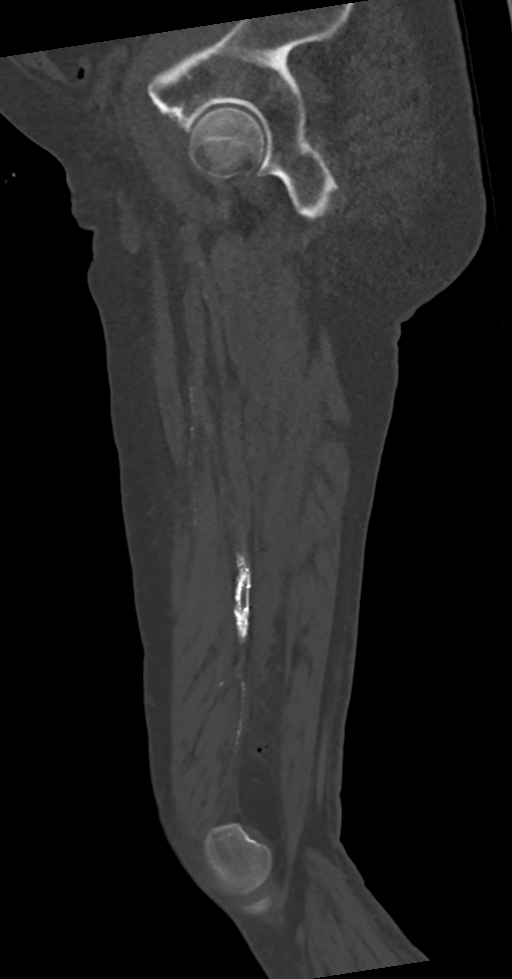
[im 80/138  bone]
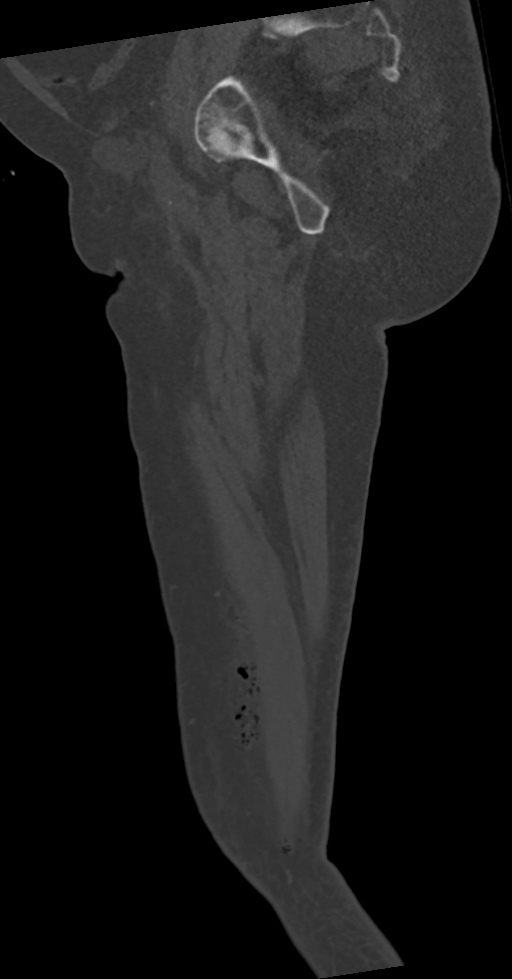
[im 92/138  bone]
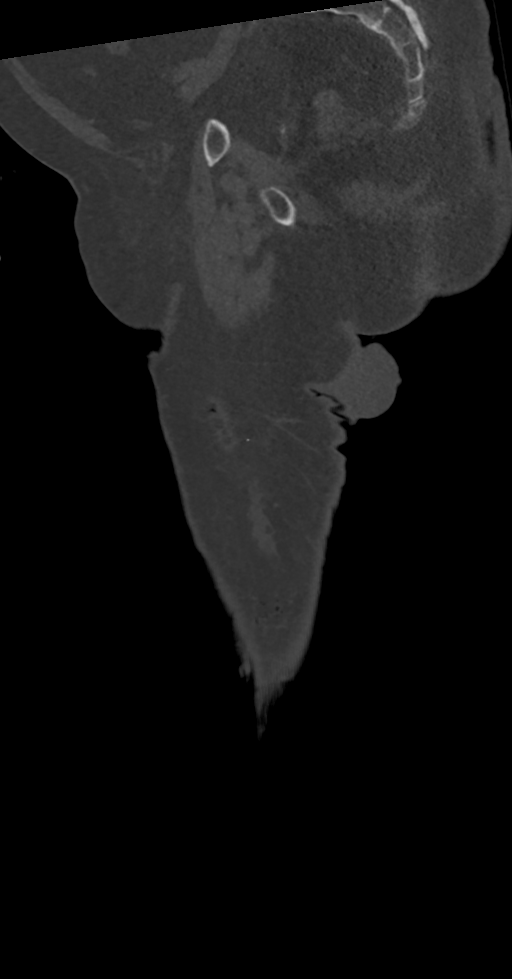

[10 of 33 positions shown; findings below may reference images not displayed]

FINDINGS: Suboptimal evaluation, secondary to a lack of intravenous contrast.

Bones

No evidence of demineralization, erosion or periosteal change as can
be seen with osteomyelitis.

No RIGHT knee joint effusion.

Ligaments and tendonssuboptimally assessed by CT.

Soft tissues

*Multilevel RIGHT lower extremity arterial vascular calcifications,
predominantly within the SFA and deep femoral artery, with long
segment (mid to adductor hiatus) SFA stenting.
*Subcutaneous gas as seen at the medial RIGHT thigh soft tissues,
and extending to the level of the femoral vascular sheath, in a
segment measuring up to 18.4 cm in length. Findings greater than
expected residual 2 weeks s/p surgery.
*Mild subcutaneous and soft tissue thickening. No focal drainable
collection or abscess.
*No acute abnormality within the imaged portions of the pelvis.
IMPRESSION: 1. Subcutaneous gas at the medial RIGHT thigh soft tissues,
extending to the femoral vascular sheath. Findings greater than
expected residual 2 weeks post surgery.
2. No CT evidence of osteomyelitis, focal drainable collection or
abscess.

These results will be called to the ordering clinician or
representative by the Radiologist Assistant, and communication
documented in the PACS or [REDACTED].

## 2023-02-15 ENCOUNTER — Other Ambulatory Visit (HOSPITAL_COMMUNITY): Payer: Medicare Other

## 2023-02-15 ENCOUNTER — Ambulatory Visit: Payer: Medicare Other

## 2023-02-15 ENCOUNTER — Encounter (HOSPITAL_COMMUNITY): Payer: Medicare Other

## 2023-02-15 DIAGNOSIS — M25561 Pain in right knee: Secondary | ICD-10-CM | POA: Diagnosis not present

## 2023-02-16 DIAGNOSIS — E1165 Type 2 diabetes mellitus with hyperglycemia: Secondary | ICD-10-CM | POA: Diagnosis not present

## 2023-03-18 DIAGNOSIS — E1165 Type 2 diabetes mellitus with hyperglycemia: Secondary | ICD-10-CM | POA: Diagnosis not present

## 2023-03-18 DIAGNOSIS — Z794 Long term (current) use of insulin: Secondary | ICD-10-CM | POA: Diagnosis not present

## 2023-03-29 DIAGNOSIS — E1151 Type 2 diabetes mellitus with diabetic peripheral angiopathy without gangrene: Secondary | ICD-10-CM | POA: Diagnosis not present

## 2023-03-29 DIAGNOSIS — Z0000 Encounter for general adult medical examination without abnormal findings: Secondary | ICD-10-CM | POA: Diagnosis not present

## 2023-03-29 DIAGNOSIS — I10 Essential (primary) hypertension: Secondary | ICD-10-CM | POA: Diagnosis not present

## 2023-03-29 DIAGNOSIS — B351 Tinea unguium: Secondary | ICD-10-CM | POA: Diagnosis not present

## 2023-03-29 DIAGNOSIS — Z794 Long term (current) use of insulin: Secondary | ICD-10-CM | POA: Diagnosis not present

## 2023-03-29 DIAGNOSIS — L851 Acquired keratosis [keratoderma] palmaris et plantaris: Secondary | ICD-10-CM | POA: Diagnosis not present

## 2023-03-29 DIAGNOSIS — E782 Mixed hyperlipidemia: Secondary | ICD-10-CM | POA: Diagnosis not present

## 2023-03-29 DIAGNOSIS — M79672 Pain in left foot: Secondary | ICD-10-CM | POA: Diagnosis not present

## 2023-03-29 DIAGNOSIS — E1142 Type 2 diabetes mellitus with diabetic polyneuropathy: Secondary | ICD-10-CM | POA: Diagnosis not present

## 2023-04-13 DIAGNOSIS — L281 Prurigo nodularis: Secondary | ICD-10-CM | POA: Diagnosis not present

## 2023-04-17 DIAGNOSIS — E1165 Type 2 diabetes mellitus with hyperglycemia: Secondary | ICD-10-CM | POA: Diagnosis not present

## 2023-04-17 DIAGNOSIS — Z794 Long term (current) use of insulin: Secondary | ICD-10-CM | POA: Diagnosis not present

## 2023-04-27 DIAGNOSIS — E1151 Type 2 diabetes mellitus with diabetic peripheral angiopathy without gangrene: Secondary | ICD-10-CM | POA: Diagnosis not present

## 2023-04-27 DIAGNOSIS — Z8546 Personal history of malignant neoplasm of prostate: Secondary | ICD-10-CM | POA: Diagnosis not present

## 2023-04-27 DIAGNOSIS — Z794 Long term (current) use of insulin: Secondary | ICD-10-CM | POA: Diagnosis not present

## 2023-04-28 ENCOUNTER — Other Ambulatory Visit: Payer: Self-pay

## 2023-04-28 DIAGNOSIS — M25561 Pain in right knee: Secondary | ICD-10-CM | POA: Diagnosis not present

## 2023-04-28 DIAGNOSIS — D509 Iron deficiency anemia, unspecified: Secondary | ICD-10-CM

## 2023-04-28 DIAGNOSIS — D649 Anemia, unspecified: Secondary | ICD-10-CM

## 2023-04-28 DIAGNOSIS — M25551 Pain in right hip: Secondary | ICD-10-CM | POA: Diagnosis not present

## 2023-04-29 ENCOUNTER — Inpatient Hospital Stay: Payer: Medicare Other | Attending: Hematology

## 2023-04-29 DIAGNOSIS — D509 Iron deficiency anemia, unspecified: Secondary | ICD-10-CM

## 2023-04-29 DIAGNOSIS — D649 Anemia, unspecified: Secondary | ICD-10-CM | POA: Diagnosis not present

## 2023-04-29 LAB — CBC WITH DIFFERENTIAL/PLATELET
Abs Immature Granulocytes: 0.04 10*3/uL (ref 0.00–0.07)
Basophils Absolute: 0 10*3/uL (ref 0.0–0.1)
Basophils Relative: 0 %
Eosinophils Absolute: 0.1 10*3/uL (ref 0.0–0.5)
Eosinophils Relative: 1 %
HCT: 44.2 % (ref 39.0–52.0)
Hemoglobin: 14.5 g/dL (ref 13.0–17.0)
Immature Granulocytes: 1 %
Lymphocytes Relative: 22 %
Lymphs Abs: 1.7 10*3/uL (ref 0.7–4.0)
MCH: 32.2 pg (ref 26.0–34.0)
MCHC: 32.8 g/dL (ref 30.0–36.0)
MCV: 98.2 fL (ref 80.0–100.0)
Monocytes Absolute: 0.7 10*3/uL (ref 0.1–1.0)
Monocytes Relative: 10 %
Neutro Abs: 5.1 10*3/uL (ref 1.7–7.7)
Neutrophils Relative %: 66 %
Platelets: 262 10*3/uL (ref 150–400)
RBC: 4.5 MIL/uL (ref 4.22–5.81)
RDW: 15.5 % (ref 11.5–15.5)
WBC: 7.5 10*3/uL (ref 4.0–10.5)
nRBC: 0 % (ref 0.0–0.2)

## 2023-04-29 LAB — COMPREHENSIVE METABOLIC PANEL
ALT: 13 U/L (ref 0–44)
AST: 17 U/L (ref 15–41)
Albumin: 4 g/dL (ref 3.5–5.0)
Alkaline Phosphatase: 50 U/L (ref 38–126)
Anion gap: 14 (ref 5–15)
BUN: 28 mg/dL — ABNORMAL HIGH (ref 8–23)
CO2: 25 mmol/L (ref 22–32)
Calcium: 9.9 mg/dL (ref 8.9–10.3)
Chloride: 100 mmol/L (ref 98–111)
Creatinine, Ser: 1.06 mg/dL (ref 0.61–1.24)
GFR, Estimated: >60 mL/min (ref >60)
Glucose, Bld: 158 mg/dL — ABNORMAL HIGH (ref 70–99)
Potassium: 3.4 mmol/L — ABNORMAL LOW (ref 3.5–5.1)
Sodium: 139 mmol/L (ref 135–145)
Total Bilirubin: 1.1 mg/dL (ref 0.0–1.2)
Total Protein: 7.2 g/dL (ref 6.5–8.1)

## 2023-04-29 LAB — VITAMIN B12: Vitamin B-12: 268 pg/mL (ref 180–914)

## 2023-04-29 LAB — IRON AND TIBC
Iron: 105 ug/dL (ref 45–182)
Saturation Ratios: 28 % (ref 17.9–39.5)
TIBC: 381 ug/dL (ref 250–450)
UIBC: 276 ug/dL

## 2023-04-29 LAB — FERRITIN: Ferritin: 28 ng/mL (ref 24–336)

## 2023-05-05 ENCOUNTER — Inpatient Hospital Stay: Attending: Hematology | Admitting: Oncology

## 2023-05-05 DIAGNOSIS — D509 Iron deficiency anemia, unspecified: Secondary | ICD-10-CM | POA: Diagnosis not present

## 2023-05-05 DIAGNOSIS — C61 Malignant neoplasm of prostate: Secondary | ICD-10-CM | POA: Insufficient documentation

## 2023-05-05 DIAGNOSIS — L281 Prurigo nodularis: Secondary | ICD-10-CM | POA: Diagnosis not present

## 2023-05-05 DIAGNOSIS — D225 Melanocytic nevi of trunk: Secondary | ICD-10-CM | POA: Diagnosis not present

## 2023-05-05 DIAGNOSIS — D649 Anemia, unspecified: Secondary | ICD-10-CM

## 2023-05-05 DIAGNOSIS — D2261 Melanocytic nevi of right upper limb, including shoulder: Secondary | ICD-10-CM | POA: Diagnosis not present

## 2023-05-05 DIAGNOSIS — Z1283 Encounter for screening for malignant neoplasm of skin: Secondary | ICD-10-CM | POA: Diagnosis not present

## 2023-05-05 DIAGNOSIS — D485 Neoplasm of uncertain behavior of skin: Secondary | ICD-10-CM | POA: Diagnosis not present

## 2023-05-05 NOTE — Progress Notes (Signed)
 [facility] [address], [place] [address]   Clinic Day:  05/05/23   Referring physician: Genia Hotter, FNP  Patient Care Team: Stamey, Verda Cumins, FNP as PCP - General (Family Medicine) Corky Crafts, MD as PCP - Cardiology (Cardiology) Corky Crafts, MD as Attending Physician (Cardiology) Ernesto Rutherford, MD as Consulting Physician (Ophthalmology) Talmage Coin, MD as Consulting Physician (Endocrinology) Doreatha Massed, MD as Medical Oncologist (Hematology)   ASSESSMENT & PLAN:   Assessment:  1.  Normocytic anemia: - Labs at [facility] on 08/28/2022: Hb-9.6, MCV-88, WBC-5.1, PLT-347, creatinine-1.02, calcium 10.6.  Hemoglobin was 8.5 few weeks prior to that.  He started taking iron tablet 2 weeks ago.  Reports constipation. - Reports fatigue for 5 years. - Negative colonoscopy 6 months ago with Dr. Levora Angel with [facility].  - Stool for occult blood checked negative at [facility] physicians.  2.  Social/family history: - Lives at home with his wife.  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  Non-smoker.  Chewed tobacco for few years and quit in 05-13-1975. - Sister died of Hodgkin's disease at age 35 in 55.  20.  T2a prostate cancer: - Gleason 3+4=7, PSA 7.54. - XRT 70 Gray in 28 fractions from 04/11/2018 - 05/18/2018 - PSA less than 0.1.   Plan:  1.  Normocytic anemia: - Secondary to iron deficiency and malabsorption s/p 2 doses of IV iron given last on 09/29/2022 and 10/06/2022. -Hematology workup did not reveal any nutritional deficiencies, underlying myeloproliferative neoplasm or evidence of hemolysis. -He denies any bleeding, melena or hematochezia. -Repeat labs from 04/29/2023 shows a ferritin of 28 (35), iron saturation 28% with a normal TIBC.  Hemoglobin is 14.5 (13.2) with a normal differential.  Vitamin B12 level is 268 (256).  - Reports persistent fatigue. -We discussed continuing oral iron which he is taking 3  times per week versus 2 doses of IV Feraheme given ferritin continues to downtrend.  Patient was agreeable to additional IV iron.  He will continue oral iron 3 times per week to avoid constipation. -Recommend follow-up in approximately 6 months with labs a few days before.   PLAN SUMMARY: >> Recommend 2 doses of IV Feraheme given about a week apart.  Continue oral iron 3 times per week. >> Return to clinic in approximately 6 months with labs a few days before and follow-up clinic.   I spent 25 minutes dedicated to the care of this patient (face-to-face and non-face-to-face) on the date of the encounter to include what is described in the assessment and plan.   Orders Placed This Encounter  Procedures   CBC with Differential    Standing Status:   Future    Expected Date:   11/05/2023    Expiration Date:   05/04/2024   Comprehensive metabolic panel    Standing Status:   Future    Expected Date:   11/05/2023    Expiration Date:   05/04/2024   Iron and TIBC ([facility] ONLY)    Standing Status:   Future    Expected Date:   11/05/2023    Expiration Date:   05/04/2024   Ferritin    Standing Status:   Future    Expected Date:   11/05/2023    Expiration Date:   05/04/2024   Vitamin B12    Standing Status:   Future    Expected Date:   11/05/2023    Expiration Date:   05/04/2024  Mauro Kaufmann, NP   3/5/20252:01 PM  CHIEF COMPLAINT/PURPOSE OF CONSULT:   Diagnosis: anemia  Current Therapy:  iron pills and intermittent IV Feraheme.  HISTORY OF PRESENT ILLNESS:  Michael [name] is a 73 year old male with past medical history difficult for hypertension, PVD D, PAD, diabetes type 2, lumbar radiculopathy, prostate cancer and iron deficiency anemia.  He was last seen in clinic by me on 02/04/23.   He received 2 doses of IV Feraheme on 09/29/2022 and 10/06/2022.   He denies any interval hospitalizations, surgeries or changes to his baseline health.  Denies any bleeding, hematochezia or  melena.  Reports overall he is doing well.  Denies any bleeding.  States he did have a negative stool occult within the past 6 months at [facility].  Reports he has psoriatic arthritis in his fingers and arthritis in his right hip.  Reports slightly more fatigued than usual.  He has talked to his PCP about possible testosterone supplements but for now they are holding off.  He is taking his iron supplements 3 times per week due to constipation.  Reports he has daily bowel movements.   He has a history of prostate cancer approximately 5 years ago and is currently in remission.  He notes he had radiation for prostate cancer in 2019, which has lowered his testosterone levels. He denies being given Lupron shots with radiation. He notes he saw Dr. Kathrynn Running at [facility] for prostate cancer. He has an annual visit with urologist.  Continues to have gait instability related to right PVD status post staph infection and multiple surgeries.  Has significant tightness in right pelvis extending from his hip down to his knee.  Reports stiffness with ambulation.  This is chronic problem.   PAST MEDICAL HISTORY:   Past Medical History: Past Medical History:  Diagnosis Date   Allergy    Anxiety    Arthritis    psoriatic arthritis   Diabetes mellitus    GERD (gastroesophageal reflux disease)    Hyperlipidemia    Hypertension    PAD (peripheral artery disease) (HCC)    a. s/p prior LE stenting 2012, 03/2017 - followed by [facility].   Peripheral arterial disease (HCC)    Prostate cancer (HCC)    Sleep apnea 2016   wears CPAP sometimes    Surgical History: Past Surgical History:  Procedure Laterality Date   ABDOMINAL AORTOGRAM N/A 09/01/2016   Procedure: Abdominal Aortogram;  Surgeon: Nada Libman, MD;  [facility];  Service: Cardiovascular;  Laterality: N/A;   ABDOMINAL AORTOGRAM W/LOWER EXTREMITY N/A 03/16/2017   Procedure: ABDOMINAL AORTOGRAM W/LOWER EXTREMITY;  Surgeon:  Nada Libman, MD;  [facility]: [facility];  Service: Cardiovascular;  Laterality: N/A;   ANGIOPLASTY Right 01/30/2021   Procedure: BALLOON ANGIOPLASTY;  Surgeon: Nada Libman, MD;  Location: [facility] OR;  Service: Vascular;  Laterality: Right;   ANGIOPLASTY Right 10/15/2021   Procedure: RIGHT SUPERFICIAL FEMORAL ARTERY BALLOON ANGIOPLASTY;  Surgeon: Nada Libman, MD;  Location: [facility];  Service: Vascular;  Laterality: Right;   ANGIOPLASTY / STENTING FEMORAL  11/05/2010   Left SFA stent   AORTOGRAM N/A 10/15/2021   Procedure: ABDOMINAL AORTOGRAM;  Surgeon: Nada Libman, MD;  Location: [facility];  Service: Vascular;  Laterality: N/A;   APPLICATION OF WOUND VAC Right 04/16/2021   Procedure: APPLICATION OF WOUND VAC;  Surgeon: Nada Libman, MD;  [facility];  Service: Vascular;  Laterality: Right;   APPLICATION  OF WOUND VAC Right 05/02/2021   Procedure: APPLICATION OF WOUND VAC;  Surgeon: Chuck Hint, MD;  Location: [facility] OR;  Service: Vascular;  Laterality: Right;   APPLICATION OF WOUND VAC Right 05/05/2021   Procedure: WOUND VAC CHANGE RIGHT GROIN, APPLICATION OF WOUND VAC PROXIMAL AND DISTAL MEDIAL RIGHT THIGH;  Surgeon: Leonie Douglas, MD;  [facility];  Service: Vascular;  Laterality: Right;   ENDARTERECTOMY FEMORAL Right 04/16/2021   Procedure: RIGHT FEMORAL ENDARTERECTOMY;  Surgeon: Nada Libman, MD;  [facility];  Service: Vascular;  Laterality: Right;   EYE SURGERY  2010   bilateral cataract   FEMORAL-POPLITEAL BYPASS GRAFT Right 04/16/2021   Procedure: RIGHT FEMORAL-POPLITEAL BYPASS USING VEIN;  Surgeon: Nada Libman, MD;  [facility];  Service: Vascular;  Laterality: Right;   GROIN DEBRIDEMENT Right 05/05/2021   Procedure: IRRIGATION AND DEBRIDEMENT RIGHT GROIN AND RIGHT THIGH;  Surgeon: Leonie Douglas, MD;  [facility];  Service: Vascular;  Laterality: Right;   I & D EXTREMITY Right 05/02/2021   Procedure: IRRIGATION AND DEBRIDEMENT  RIGHT LEG;  Surgeon: Chuck Hint, MD;  Location: [facility] OR;  Service: Vascular;  Laterality: Right;   I & D EXTREMITY Right 10/15/2021   Procedure: IRRIGATION AND DEBRIDEMENT RIGHT LEG;  Surgeon: Nada Libman, MD;  [facility];  Service: Vascular;  Laterality: Right;   KNEE ARTHROSCOPY     LOWER EXTREMITY ANGIOGRAM Right 01/30/2021   Procedure: LOWER EXTREMITY ANGIOGRAM WITH ARTERIOGRAM OF RIGHT LOWER EXTREMITY;  Surgeon: Nada Libman, MD;  [facility];  Service: Vascular;  Laterality: Right;   LOWER EXTREMITY ANGIOGRAPHY N/A 09/01/2016   Procedure: Lower Extremity Angiography;  Surgeon: Nada Libman, MD;  [facility];  Service: Cardiovascular;  Laterality: N/A;   PERIPHERAL VASCULAR ATHERECTOMY Right 09/01/2016   Procedure: Peripheral Vascular Atherectomy;  Surgeon: Nada Libman, MD;  [facility];  Service: Cardiovascular;  Laterality: Right;  superficial femoral   PERIPHERAL VASCULAR INTERVENTION Right 03/16/2017   Procedure: PERIPHERAL VASCULAR INTERVENTION;  Surgeon: Nada Libman, MD;  [facility];  Service: Cardiovascular;  Laterality: Right;  superficicial femoral   ROTATOR CUFF REPAIR     1990   ROTATOR CUFF REPAIR Bilateral 2017   ULTRASOUND GUIDANCE FOR VASCULAR ACCESS Left 01/30/2021   Procedure: ULTRASOUND GUIDANCE FOR VASCULAR ACCESS;  Surgeon: Nada Libman, MD;  [facility];  Service: Vascular;  Laterality: Left;   ULTRASOUND GUIDANCE FOR VASCULAR ACCESS Left 10/15/2021   Procedure: ULTRASOUND GUIDANCE FOR VASCULAR ACCESS;  Surgeon: Nada Libman, MD;  [facility];  Service: Vascular;  Laterality: Left;   VEIN HARVEST Right 04/16/2021   Procedure: RIGHT GREATER SAPHENOUS VEIN HARVEST;  Surgeon: Nada Libman, MD;  [facility];  Service: Vascular;  Laterality: Right;    Social History: Social History   Socioeconomic History   Marital status: Married    Spouse name: Not on  file   Number of children: Not on file   Years of education: Not on file   Highest education level: Not on file  Occupational History   Not on file  Tobacco Use   Smoking status: Never    Passive exposure: Never   Smokeless tobacco: Former    Quit date: 10/26/1981  Vaping Use   Vaping status: Never Used  Substance and Sexual Activity   Alcohol use: Yes    Comment: occasional drink, maybe a glass  of wine once every 6 months   Drug use: No   Sexual activity: Not on file  Other Topics Concern   Not on file  Social History Narrative   Not on file   Social Drivers of Health   Financial Resource Strain: Not on file  Food Insecurity: Not on file  Transportation Needs: Not on file  Physical Activity: Not on file  Stress: Not on file  Social Connections: Not on file  Intimate Partner Violence: Not on file    Family History: Family History  Problem Relation Age of Onset   Hyperlipidemia Mother    Hypertension Mother    Heart disease Father    Deep vein thrombosis Father    Hyperlipidemia Father    Hypertension Father    Heart attack Father    Peripheral vascular disease Father    Cancer Sister     Current Medications:  Current Outpatient Medications:    ACCU-CHEK AVIVA PLUS test strip, AS DIRECTED SEVEN TIMES A DAY IN VITRO 90 DAYS, Disp: , Rfl: 4   acetaminophen (TYLENOL) 500 MG tablet, Take 1,000 mg by mouth every 6 (six) hours as needed for moderate pain., Disp: , Rfl:    amLODipine (NORVASC) 10 MG tablet, Take 10 mg by mouth daily., Disp: , Rfl:    Apremilast (OTEZLA) 20 MG TABS, Take 20 mg by mouth in the morning and at bedtime., Disp: , Rfl:    aspirin EC 81 MG tablet, Take 1 tablet (81 mg total) by mouth daily., Disp: , Rfl:    atorvastatin (LIPITOR) 40 MG tablet, Take 40 mg by mouth at bedtime., Disp: , Rfl: 5   benazepril (LOTENSIN) 40 MG tablet, Take 40 mg by mouth daily., Disp: , Rfl:    calcipotriene (DOVONOX) 0.005 % cream, , Disp: , Rfl:    cefadroxil  (DURICEF) 500 MG capsule, Take 2 capsules (1,000 mg total) by mouth 2 (two) times daily., Disp: 56 capsule, Rfl: 0   clonazePAM (KLONOPIN) 1 MG tablet, Take 2 mg by mouth at bedtime as needed (sleep)., Disp: , Rfl:    clopidogrel (PLAVIX) 75 MG tablet, TAKE 1 TABLET BY MOUTH EVERY DAY WITH BREAKFAST, Disp: 90 tablet, Rfl: 3   Continuous Blood Gluc Receiver (FREESTYLE LIBRE 14 DAY READER) DEVI, use to monitor blood sugar, Disp: , Rfl:    Continuous Glucose Sensor (FREESTYLE LIBRE 3 SENSOR) MISC, USE TO CHECK INSULIN THREE TIMES DAILY. CHANGE SENSOR EVERY 14 DAYS, Disp: , Rfl:    dapagliflozin propanediol (FARXIGA) 5 MG TABS tablet, Take 5 mg by mouth daily., Disp: , Rfl:    diphenhydrAMINE (BENADRYL) 25 MG tablet, Take 25 mg by mouth at bedtime as needed for sleep., Disp: , Rfl:    Ferrous Sulfate 27 MG TABS, Take by oral route., Disp: , Rfl:    folic acid (FOLVITE) 1 MG tablet, Take 1 mg by mouth daily., Disp: , Rfl:    gabapentin (NEURONTIN) 300 MG capsule, Take 600 mg by mouth 3 (three) times daily., Disp: , Rfl:    hydrochlorothiazide (HYDRODIURIL) 25 MG tablet, Take 1 tablet (25 mg total) by mouth daily. Please make overdue appt with Dr. Eldridge Dace before anymore refills. 2nd attempt, Disp: 15 tablet, Rfl: 0   insulin glargine (LANTUS) 100 UNIT/ML injection, Inject 20 Units into the skin at bedtime., Disp: , Rfl:    Insulin Pen Needle (BD PEN NEEDLE NANO 2ND GEN) 32G X 4 MM MISC, USE FOUR TIMES DAILY for 100 days, Disp: , Rfl:  metFORMIN (GLUCOPHAGE-XR) 500 MG 24 hr tablet, Take 2,000 mg by mouth at bedtime., Disp: , Rfl:    methotrexate (RHEUMATREX) 2.5 MG tablet, Take 20 mg by mouth once a week., Disp: , Rfl:    metoprolol succinate (TOPROL-XL) 100 MG 24 hr tablet, Take 100 mg by mouth daily., Disp: , Rfl:    NOVOLOG FLEXPEN 100 UNIT/ML FlexPen, Inject into the skin., Disp: , Rfl:    solifenacin (VESICARE) 5 MG tablet, Take 5 mg by mouth every evening., Disp: , Rfl:    tamsulosin (FLOMAX) 0.4  MG CAPS capsule, Take 0.4 mg by mouth at bedtime., Disp: , Rfl: 5   tiZANidine (ZANAFLEX) 4 MG tablet, SMARTSIG:0.5-1 Tablet(s) By Mouth 1-3 Times Daily, Disp: , Rfl:    traZODone (DESYREL) 50 MG tablet, Take 50 mg by mouth at bedtime as needed for sleep., Disp: , Rfl:    trolamine salicylate (ASPERCREME) 10 % cream, Apply 1 Application topically as needed for muscle pain., Disp: , Rfl:    Allergies: Allergies  Allergen Reactions   Demerol Anaphylaxis   Lactose Intolerance (Gi) Anaphylaxis   Oxycodone Anxiety and Other (See Comments)    Suicidal ideation   Penicillins Anaphylaxis and Other (See Comments)    Tolerates cefepime    Actos [Pioglitazone] Nausea Only   Exenatide Nausea Only and Other (See Comments)   Glimepiride Other (See Comments)    Edema in ankles    Nabumetone Nausea Only and Other (See Comments)   Victoza [Liraglutide] Nausea Only    Shaking   Fentanyl Anxiety    Jittery    Hydrocodone Anxiety    Suicidal ideation    REVIEW OF SYSTEMS:   Review of Systems  Constitutional:  Positive for fatigue.  Gastrointestinal:  Positive for abdominal pain (LLQ at times) and constipation. Negative for blood in stool.  Genitourinary:  Negative for bladder incontinence.   Musculoskeletal:  Positive for arthralgias. Negative for myalgias.     VITALS:   Blood pressure 130/68, pulse 73, temperature 97.7 F (36.5 C), temperature source Tympanic, resp. rate 18, height 6\' 1"  (1.854 m), weight 219 lb (99.3 kg), SpO2 96%.  Wt Readings from Last 3 Encounters:  05/05/23 219 lb (99.3 kg)  02/04/23 224 lb 3.2 oz (101.7 kg)  01/11/23 219 lb 12.8 oz (99.7 kg)    Body mass index is 28.89 kg/m.   PHYSICAL EXAM:   Physical Exam Constitutional:      Appearance: Normal appearance.  Cardiovascular:     Rate and Rhythm: Normal rate and regular rhythm.  Pulmonary:     Effort: Pulmonary effort is normal.     Breath sounds: Normal breath sounds.  Abdominal:     General: Bowel  sounds are normal.     Palpations: Abdomen is soft.  Musculoskeletal:        General: No swelling. Normal range of motion.  Neurological:     Mental Status: He is alert and oriented to person, place, and time. Mental status is at baseline.     LABS:      Latest Ref Rng & Units 04/29/2023    1:11 PM 01/26/2023    7:58 AM 11/09/2022    8:52 AM  CBC  WBC 4.0 - 10.5 K/uL 7.5  6.7  7.1   Hemoglobin 13.0 - 17.0 g/dL 91.4  78.2  95.6   Hematocrit 39.0 - 52.0 % 44.2  42.0  42.5   Platelets 150 - 400 K/uL 262  221  216  Latest Ref Rng & Units 04/29/2023    1:11 PM 09/11/2022    9:07 AM 10/15/2021    7:38 AM  CMP  Glucose 70 - 99 mg/dL 161   096   BUN 8 - 23 mg/dL 28   21   Creatinine 0.45 - 1.24 mg/dL 4.09   8.11   Sodium 914 - 145 mmol/L 139   139   Potassium 3.5 - 5.1 mmol/L 3.4   4.3   Chloride 98 - 111 mmol/L 100   106   CO2 22 - 32 mmol/L 25     Calcium 8.9 - 10.3 mg/dL 9.9  9.1    Total Protein 6.5 - 8.1 g/dL 7.2     Total Bilirubin 0.0 - 1.2 mg/dL 1.1     Alkaline Phos 38 - 126 U/L 50     AST 15 - 41 U/L 17     ALT 0 - 44 U/L 13        No results found for: "CEA1", "CEA" / No results found for: "CEA1", "CEA" No results found for: "PSA1" No results found for: "CAN199" No results found for: "CAN125"  Lab Results  Component Value Date   TOTALPROTELP 6.6 09/11/2022   TOTALPROTELP 6.7 09/11/2022   ALBUMINELP 3.6 09/11/2022   A1GS 0.2 09/11/2022   A2GS 1.0 09/11/2022   BETS 1.3 09/11/2022   GAMS 0.6 09/11/2022   MSPIKE Not Observed 09/11/2022   SPEI Comment 09/11/2022   Lab Results  Component Value Date   TIBC 381 04/29/2023   TIBC 363 01/26/2023   TIBC 330 11/09/2022   FERRITIN 28 04/29/2023   FERRITIN 35 01/26/2023   FERRITIN 80 11/09/2022   IRONPCTSAT 28 04/29/2023   IRONPCTSAT 25 01/26/2023   IRONPCTSAT 37 11/09/2022   Lab Results  Component Value Date   LDH 112 09/11/2022     STUDIES:   No results found.

## 2023-05-06 ENCOUNTER — Inpatient Hospital Stay: Payer: Medicare Other | Admitting: Oncology

## 2023-05-06 DIAGNOSIS — N289 Disorder of kidney and ureter, unspecified: Secondary | ICD-10-CM | POA: Diagnosis not present

## 2023-05-10 ENCOUNTER — Inpatient Hospital Stay

## 2023-05-10 VITALS — BP 102/56 | HR 56 | Temp 97.7°F | Resp 16

## 2023-05-10 DIAGNOSIS — D649 Anemia, unspecified: Secondary | ICD-10-CM | POA: Diagnosis not present

## 2023-05-10 DIAGNOSIS — C61 Malignant neoplasm of prostate: Secondary | ICD-10-CM | POA: Diagnosis not present

## 2023-05-10 DIAGNOSIS — D509 Iron deficiency anemia, unspecified: Secondary | ICD-10-CM

## 2023-05-10 MED ORDER — SODIUM CHLORIDE 0.9 % IV SOLN
510.0000 mg | Freq: Once | INTRAVENOUS | Status: AC
Start: 2023-05-10 — End: 2023-05-10
  Administered 2023-05-10: 510 mg via INTRAVENOUS
  Filled 2023-05-10: qty 510

## 2023-05-10 MED ORDER — ACETAMINOPHEN 325 MG PO TABS
650.0000 mg | ORAL_TABLET | Freq: Once | ORAL | Status: AC
  Administered 2023-05-10: 650 mg via ORAL
  Filled 2023-05-10: qty 2

## 2023-05-10 MED ORDER — METHYLPREDNISOLONE SODIUM SUCC 125 MG IJ SOLR
125.0000 mg | Freq: Once | INTRAMUSCULAR | Status: AC
  Administered 2023-05-10: 125 mg via INTRAVENOUS
  Filled 2023-05-10: qty 2

## 2023-05-10 MED ORDER — SODIUM CHLORIDE 0.9 % IV SOLN: Freq: Once | INTRAVENOUS | Status: AC

## 2023-05-10 MED ORDER — FAMOTIDINE IN NACL 20-0.9 MG/50ML-% IV SOLN
20.0000 mg | Freq: Once | INTRAVENOUS | Status: AC
  Administered 2023-05-10: 20 mg via INTRAVENOUS
  Filled 2023-05-10: qty 50

## 2023-05-10 MED ORDER — CETIRIZINE HCL 10 MG PO TABS
10.0000 mg | ORAL_TABLET | Freq: Once | ORAL | Status: AC
  Administered 2023-05-10: 10 mg via ORAL
  Filled 2023-05-10: qty 1

## 2023-05-10 NOTE — Progress Notes (Signed)
 Patient tolerated iron infusion with no complaints voiced.  Peripheral IV site clean and dry with good blood return noted before and after infusion.  Band aid applied.  Pt observed for 30 minutes post Feraheme infusion without any complications. VSS with discharge and left in satisfactory condition with no s/s of distress noted. All follow ups as scheduled.   Latika Kronick [employer]

## 2023-05-10 NOTE — Telephone Encounter (Signed)
 Called patient to schedule remaining restorations.  Patient's knee has been feeling better, but still causing pain.   Patient is currently in hospital. We will delay remaining dental needs.

## 2023-05-10 NOTE — Patient Instructions (Signed)

## 2023-05-17 ENCOUNTER — Inpatient Hospital Stay

## 2023-06-14 DIAGNOSIS — E1142 Type 2 diabetes mellitus with diabetic polyneuropathy: Secondary | ICD-10-CM | POA: Diagnosis not present

## 2023-06-14 DIAGNOSIS — L851 Acquired keratosis [keratoderma] palmaris et plantaris: Secondary | ICD-10-CM | POA: Diagnosis not present

## 2023-06-14 DIAGNOSIS — B351 Tinea unguium: Secondary | ICD-10-CM | POA: Diagnosis not present

## 2023-06-14 DIAGNOSIS — E1151 Type 2 diabetes mellitus with diabetic peripheral angiopathy without gangrene: Secondary | ICD-10-CM | POA: Diagnosis not present

## 2023-07-13 DIAGNOSIS — Z794 Long term (current) use of insulin: Secondary | ICD-10-CM | POA: Diagnosis not present

## 2023-07-13 DIAGNOSIS — E1151 Type 2 diabetes mellitus with diabetic peripheral angiopathy without gangrene: Secondary | ICD-10-CM | POA: Diagnosis not present

## 2023-08-06 ENCOUNTER — Encounter: Payer: Self-pay | Admitting: Surgery

## 2023-08-10 DIAGNOSIS — E1165 Type 2 diabetes mellitus with hyperglycemia: Secondary | ICD-10-CM | POA: Diagnosis not present

## 2023-08-10 DIAGNOSIS — Z794 Long term (current) use of insulin: Secondary | ICD-10-CM | POA: Diagnosis not present

## 2023-08-23 DIAGNOSIS — L851 Acquired keratosis [keratoderma] palmaris et plantaris: Secondary | ICD-10-CM | POA: Diagnosis not present

## 2023-08-23 DIAGNOSIS — E1142 Type 2 diabetes mellitus with diabetic polyneuropathy: Secondary | ICD-10-CM | POA: Diagnosis not present

## 2023-08-23 DIAGNOSIS — E1151 Type 2 diabetes mellitus with diabetic peripheral angiopathy without gangrene: Secondary | ICD-10-CM | POA: Diagnosis not present

## 2023-08-23 DIAGNOSIS — B351 Tinea unguium: Secondary | ICD-10-CM | POA: Diagnosis not present

## 2023-08-24 DIAGNOSIS — L4050 Arthropathic psoriasis, unspecified: Secondary | ICD-10-CM | POA: Diagnosis not present

## 2023-08-31 DIAGNOSIS — Z79899 Other long term (current) drug therapy: Secondary | ICD-10-CM | POA: Diagnosis not present

## 2023-08-31 DIAGNOSIS — L4050 Arthropathic psoriasis, unspecified: Secondary | ICD-10-CM | POA: Diagnosis not present

## 2023-08-31 DIAGNOSIS — L400 Psoriasis vulgaris: Secondary | ICD-10-CM | POA: Diagnosis not present

## 2023-08-31 DIAGNOSIS — M1991 Primary osteoarthritis, unspecified site: Secondary | ICD-10-CM | POA: Diagnosis not present

## 2023-09-09 DIAGNOSIS — Z794 Long term (current) use of insulin: Secondary | ICD-10-CM | POA: Diagnosis not present

## 2023-09-09 DIAGNOSIS — E1165 Type 2 diabetes mellitus with hyperglycemia: Secondary | ICD-10-CM | POA: Diagnosis not present

## 2023-09-21 DIAGNOSIS — Z4801 Encounter for change or removal of surgical wound dressing: Secondary | ICD-10-CM | POA: Diagnosis not present

## 2023-09-21 DIAGNOSIS — S71101A Unspecified open wound, right thigh, initial encounter: Secondary | ICD-10-CM | POA: Diagnosis not present

## 2023-09-21 DIAGNOSIS — E119 Type 2 diabetes mellitus without complications: Secondary | ICD-10-CM | POA: Diagnosis not present

## 2023-09-21 DIAGNOSIS — E1165 Type 2 diabetes mellitus with hyperglycemia: Secondary | ICD-10-CM | POA: Diagnosis not present

## 2023-09-21 DIAGNOSIS — Z794 Long term (current) use of insulin: Secondary | ICD-10-CM | POA: Diagnosis not present

## 2023-09-30 DIAGNOSIS — E782 Mixed hyperlipidemia: Secondary | ICD-10-CM | POA: Diagnosis not present

## 2023-09-30 DIAGNOSIS — I10 Essential (primary) hypertension: Secondary | ICD-10-CM | POA: Diagnosis not present

## 2023-09-30 DIAGNOSIS — B356 Tinea cruris: Secondary | ICD-10-CM | POA: Diagnosis not present

## 2023-09-30 DIAGNOSIS — Z6831 Body mass index (BMI) 31.0-31.9, adult: Secondary | ICD-10-CM | POA: Diagnosis not present

## 2023-10-09 DIAGNOSIS — E1165 Type 2 diabetes mellitus with hyperglycemia: Secondary | ICD-10-CM | POA: Diagnosis not present

## 2023-10-15 ENCOUNTER — Other Ambulatory Visit: Payer: Self-pay

## 2023-10-15 DIAGNOSIS — I7025 Atherosclerosis of native arteries of other extremities with ulceration: Secondary | ICD-10-CM

## 2023-10-18 ENCOUNTER — Ambulatory Visit (HOSPITAL_COMMUNITY)
Admission: RE | Admit: 2023-10-18 | Discharge: 2023-10-18 | Discharge status: Home / Self Care | Source: Ambulatory Visit | Attending: Surgery | Admitting: Surgery

## 2023-10-18 ENCOUNTER — Ambulatory Visit (HOSPITAL_COMMUNITY)
Admission: RE | Admit: 2023-10-18 | Discharge: 2023-10-18 | Disposition: A | Discharge status: Home / Self Care | Source: Ambulatory Visit | Attending: Surgery | Admitting: Surgery

## 2023-10-18 DIAGNOSIS — I7025 Atherosclerosis of native arteries of other extremities with ulceration: Secondary | ICD-10-CM

## 2023-10-21 ENCOUNTER — Ambulatory Visit (HOSPITAL_COMMUNITY)
Admission: RE | Admit: 2023-10-21 | Discharge: 2023-10-21 | Disposition: A | Discharge status: Home / Self Care | Source: Ambulatory Visit | Attending: Surgery | Admitting: Surgery

## 2023-10-21 ENCOUNTER — Ambulatory Visit (HOSPITAL_BASED_OUTPATIENT_CLINIC_OR_DEPARTMENT_OTHER)
Admission: RE | Admit: 2023-10-21 | Discharge: 2023-10-21 | Discharge status: Home / Self Care | Source: Ambulatory Visit | Attending: Surgery | Admitting: Surgery

## 2023-10-21 DIAGNOSIS — I7025 Atherosclerosis of native arteries of other extremities with ulceration: Secondary | ICD-10-CM | POA: Diagnosis not present

## 2023-10-21 LAB — VAS US ABI WITH/WO TBI: Left ABI: 1.26

## 2023-10-26 DIAGNOSIS — E1151 Type 2 diabetes mellitus with diabetic peripheral angiopathy without gangrene: Secondary | ICD-10-CM | POA: Diagnosis not present

## 2023-10-26 DIAGNOSIS — Z794 Long term (current) use of insulin: Secondary | ICD-10-CM | POA: Diagnosis not present

## 2023-11-03 DIAGNOSIS — F419 Anxiety disorder, unspecified: Secondary | ICD-10-CM | POA: Diagnosis not present

## 2023-11-03 DIAGNOSIS — I10 Essential (primary) hypertension: Secondary | ICD-10-CM | POA: Diagnosis not present

## 2023-11-05 ENCOUNTER — Inpatient Hospital Stay: Attending: Oncology

## 2023-11-05 DIAGNOSIS — D649 Anemia, unspecified: Secondary | ICD-10-CM

## 2023-11-05 DIAGNOSIS — D509 Iron deficiency anemia, unspecified: Secondary | ICD-10-CM | POA: Diagnosis not present

## 2023-11-05 DIAGNOSIS — E1165 Type 2 diabetes mellitus with hyperglycemia: Secondary | ICD-10-CM | POA: Diagnosis not present

## 2023-11-05 LAB — IRON AND TIBC
Iron: 76 ug/dL (ref 45–182)
Saturation Ratios: 22 % (ref 17.9–39.5)
TIBC: 349 ug/dL (ref 250–450)
UIBC: 273 ug/dL

## 2023-11-05 LAB — COMPREHENSIVE METABOLIC PANEL WITH GFR
ALT: 16 U/L (ref 0–44)
AST: 15 U/L (ref 15–41)
Albumin: 3.8 g/dL (ref 3.5–5.0)
Alkaline Phosphatase: 45 U/L (ref 38–126)
Anion gap: 14 (ref 5–15)
BUN: 29 mg/dL — ABNORMAL HIGH (ref 8–23)
CO2: 25 mmol/L (ref 22–32)
Calcium: 9.6 mg/dL (ref 8.9–10.3)
Chloride: 106 mmol/L (ref 98–111)
Creatinine, Ser: 1.09 mg/dL (ref 0.61–1.24)
GFR, Estimated: >60 mL/min (ref >60)
Glucose, Bld: 89 mg/dL (ref 70–99)
Potassium: 4.8 mmol/L (ref 3.5–5.1)
Sodium: 145 mmol/L (ref 135–145)
Total Bilirubin: 1 mg/dL (ref 0.0–1.2)
Total Protein: 6.7 g/dL (ref 6.5–8.1)

## 2023-11-05 LAB — CBC WITH DIFFERENTIAL/PLATELET
Abs Immature Granulocytes: 0.02 K/uL (ref 0.00–0.07)
Basophils Absolute: 0 K/uL (ref 0.0–0.1)
Basophils Relative: 1 %
Eosinophils Absolute: 0.1 K/uL (ref 0.0–0.5)
Eosinophils Relative: 2 %
HCT: 45.1 % (ref 39.0–52.0)
Hemoglobin: 14.7 g/dL (ref 13.0–17.0)
Immature Granulocytes: 0 %
Lymphocytes Relative: 22 %
Lymphs Abs: 1.4 K/uL (ref 0.7–4.0)
MCH: 34.3 pg — ABNORMAL HIGH (ref 26.0–34.0)
MCHC: 32.6 g/dL (ref 30.0–36.0)
MCV: 105.1 fL — ABNORMAL HIGH (ref 80.0–100.0)
Monocytes Absolute: 0.5 K/uL (ref 0.1–1.0)
Monocytes Relative: 8 %
Neutro Abs: 4.3 K/uL (ref 1.7–7.7)
Neutrophils Relative %: 67 %
Platelets: 247 K/uL (ref 150–400)
RBC: 4.29 MIL/uL (ref 4.22–5.81)
RDW: 14.2 % (ref 11.5–15.5)
WBC: 6.4 K/uL (ref 4.0–10.5)
nRBC: 0 % (ref 0.0–0.2)

## 2023-11-05 LAB — VITAMIN B12: Vitamin B-12: 199 pg/mL (ref 180–914)

## 2023-11-05 LAB — FERRITIN: Ferritin: 36 ng/mL (ref 24–336)

## 2023-11-08 ENCOUNTER — Ambulatory Visit: Attending: Surgery | Admitting: Physician Assistant

## 2023-11-08 VITALS — BP 115/78 | HR 78 | Temp 97.8°F | Wt 223.4 lb

## 2023-11-08 DIAGNOSIS — B351 Tinea unguium: Secondary | ICD-10-CM | POA: Diagnosis not present

## 2023-11-08 DIAGNOSIS — L851 Acquired keratosis [keratoderma] palmaris et plantaris: Secondary | ICD-10-CM | POA: Diagnosis not present

## 2023-11-08 DIAGNOSIS — I739 Peripheral vascular disease, unspecified: Secondary | ICD-10-CM | POA: Diagnosis not present

## 2023-11-08 DIAGNOSIS — E1142 Type 2 diabetes mellitus with diabetic polyneuropathy: Secondary | ICD-10-CM | POA: Diagnosis not present

## 2023-11-08 NOTE — Progress Notes (Signed)
 Office Note   History of Present Illness   Michael [name] is a 73 y.o. (04-20-1950) male who presents for surveillance of PAD. He has a history of several vascular interventions including:   1) right SFA stenting in September 2012 2) right SFA atherectomy and DCBA in July 2018 3) right SFA stenting in January 2019 4) right SFA DCBA in December 2022 5) right femoral to above-knee popliteal artery bypass with saphenous vein in February 2023 6 & 7) irrigation and debridement of right lower extremity vein harvest incisions in March 2023 8) distal femoropopliteal bypass graft DCBA and repeat right lower extremity irrigation and debridement  He returns today for follow-up.  He is doing well at today's visit without any complaints.  He denies any claudication, rest pain, or tissue loss.  He does have chronic right lower extremity edema, which is controlled with compression stockings and leg elevation.   Current Outpatient Medications  Medication Sig Dispense Refill   ACCU-CHEK AVIVA PLUS test strip AS DIRECTED SEVEN TIMES A DAY IN VITRO 90 DAYS  4   acetaminophen  (TYLENOL ) 500 MG tablet Take 1,000 mg by mouth every 6 (six) hours as needed for moderate pain.     amLODipine  (NORVASC ) 10 MG tablet Take 10 mg by mouth daily.     Apremilast (OTEZLA) 20 MG TABS Take 20 mg by mouth in the morning and at bedtime.     aspirin  EC 81 MG tablet Take 1 tablet (81 mg total) by mouth daily.     atorvastatin  (LIPITOR) 40 MG tablet Take 40 mg by mouth at bedtime.  5   benazepril  (LOTENSIN ) 40 MG tablet Take 40 mg by mouth daily.     calcipotriene (DOVONOX) 0.005 % cream      cefadroxil  (DURICEF) 500 MG capsule Take 2 capsules (1,000 mg total) by mouth 2 (two) times daily. 56 capsule 0   clonazePAM  (KLONOPIN ) 1 MG tablet Take 2 mg by mouth at bedtime as needed (sleep).     clopidogrel  (PLAVIX ) 75 MG tablet TAKE 1 TABLET BY MOUTH EVERY DAY WITH BREAKFAST 90 tablet 3   Continuous Blood Gluc Receiver  (FREESTYLE LIBRE 14 DAY READER) DEVI use to monitor blood sugar     Continuous Glucose Sensor (FREESTYLE LIBRE 3 SENSOR) MISC USE TO CHECK INSULIN  THREE TIMES DAILY. CHANGE SENSOR EVERY 14 DAYS     dapagliflozin  propanediol (FARXIGA ) 5 MG TABS tablet Take 5 mg by mouth daily.     diphenhydrAMINE  (BENADRYL ) 25 MG tablet Take 25 mg by mouth at bedtime as needed for sleep.     Ferrous Sulfate 27 MG TABS Take by oral route.     folic acid  (FOLVITE ) 1 MG tablet Take 1 mg by mouth daily.     gabapentin (NEURONTIN) 300 MG capsule Take 600 mg by mouth 3 (three) times daily.     hydrochlorothiazide  (HYDRODIURIL ) 25 MG tablet Take 1 tablet (25 mg total) by mouth daily. Please make overdue appt with Dr. Dann before anymore refills. 2nd attempt 15 tablet 0   insulin  glargine (LANTUS ) 100 UNIT/ML injection Inject 20 Units into the skin at bedtime.     Insulin  Pen Needle (BD PEN NEEDLE NANO 2ND GEN) 32G X 4 MM MISC USE FOUR TIMES DAILY for 100 days     metFORMIN  (GLUCOPHAGE -XR) 500 MG 24 hr tablet Take 2,000 mg by mouth at bedtime.     methotrexate  (RHEUMATREX) 2.5 MG tablet Take 20 mg by mouth once a week.  metoprolol  succinate (TOPROL -XL) 100 MG 24 hr tablet Take 100 mg by mouth daily.     NOVOLOG  FLEXPEN 100 UNIT/ML FlexPen Inject into the skin.     solifenacin (VESICARE) 5 MG tablet Take 5 mg by mouth every evening.     tamsulosin  (FLOMAX ) 0.4 MG CAPS capsule Take 0.4 mg by mouth at bedtime.  5   tiZANidine (ZANAFLEX) 4 MG tablet SMARTSIG:0.5-1 Tablet(s) By Mouth 1-3 Times Daily     traZODone (DESYREL) 50 MG tablet Take 50 mg by mouth at bedtime as needed for sleep.     trolamine salicylate (ASPERCREME) 10 % cream Apply 1 Application topically as needed for muscle pain.     No current facility-administered medications for this visit.    REVIEW OF SYSTEMS (negative unless checked):   Cardiac:  []  Chest pain or chest pressure? []  Shortness of breath upon activity? []  Shortness of breath when  lying flat? []  Irregular heart rhythm?  Vascular:  []  Pain in calf, thigh, or hip brought on by walking? []  Pain in feet at night that wakes you up from your sleep? []  Blood clot in your veins? [x]  Leg swelling?  Pulmonary:  []  Oxygen at home? []  Productive cough? []  Wheezing?  Neurologic:  []  Sudden weakness in arms or legs? []  Sudden numbness in arms or legs? []  Sudden onset of difficult speaking or slurred speech? []  Temporary loss of vision in one eye? []  Problems with dizziness?  Gastrointestinal:  []  Blood in stool? []  Vomited blood?  Genitourinary:  []  Burning when urinating? []  Blood in urine?  Psychiatric:  []  Major depression  Hematologic:  []  Bleeding problems? []  Problems with blood clotting?  Dermatologic:  []  Rashes or ulcers?  Constitutional:  []  Fever or chills?  Ear/Nose/Throat:  []  Change in hearing? []  Nose bleeds? []  Sore throat?  Musculoskeletal:  []  Back pain? []  Joint pain? []  Muscle pain?   Physical Examination   Vitals:   11/08/23 0907  BP: 115/78  Pulse: 78  Temp: 97.8 F (36.6 C)  TempSrc: Temporal  Weight: 223 lb 6.4 oz (101.3 kg)   There is no height or weight on file to calculate BMI.  General:  WDWN in NAD; vital signs documented above Gait: Not observed HENT: WNL, normocephalic Pulmonary: normal non-labored breathing , without rales, rhonchi,  wheezing Cardiac: regular Abdomen: soft, NT, no masses Skin: without rashes Vascular Exam/Pulses: Palpable DP pulses bilaterally Extremities: without ischemic changes, without gangrene , without cellulitis; without open wounds;  Musculoskeletal: no muscle wasting or atrophy  Neurologic: A&O X 3;  No focal weakness or paresthesias are detected Psychiatric:  The pt has Normal affect.  Non-Invasive Vascular Imaging ABI (10/21/2023) R:  ABI: [place] (1.03),  PT: tri DP: none TBI:  0.72 L:  ABI: 1.26 (1.12),  PT: tri DP: tri TBI: 0.87   RLE Bypass Duplex  (10/21/2023) Patent right common femoral to above-knee popliteal artery bypass without stenosis   Medical Decision Making   Michael [name] is a 73 y.o. male who presents for surveillance of PAD  Based on the patient's vascular studies, his ABIs on the right are noncompressible.  His ABIs in the left are unchanged at 1.26 RLE bypass graft duplex demonstrates a patent right common femoral to above-knee popliteal artery bypass without stenosis He denies any claudication, rest pain, or tissue loss.  On exam he has palpable DP pulses bilaterally He will continue to take his daily aspirin  and statin and follow-up with our office in 1  year with ABIs and right lower extremity bypass graft duplex   Ahmed Holster PA-C [facility] Office: [phone]  Clinic MD: [place]

## 2023-11-12 ENCOUNTER — Inpatient Hospital Stay (HOSPITAL_BASED_OUTPATIENT_CLINIC_OR_DEPARTMENT_OTHER): Admitting: Oncology

## 2023-11-12 DIAGNOSIS — D649 Anemia, unspecified: Secondary | ICD-10-CM

## 2023-11-12 DIAGNOSIS — Z23 Encounter for immunization: Secondary | ICD-10-CM | POA: Diagnosis not present

## 2023-11-12 DIAGNOSIS — D508 Other iron deficiency anemias: Secondary | ICD-10-CM

## 2023-11-12 DIAGNOSIS — E538 Deficiency of other specified B group vitamins: Secondary | ICD-10-CM

## 2023-11-12 DIAGNOSIS — K909 Intestinal malabsorption, unspecified: Secondary | ICD-10-CM

## 2023-11-12 DIAGNOSIS — Z8546 Personal history of malignant neoplasm of prostate: Secondary | ICD-10-CM | POA: Diagnosis not present

## 2023-11-12 DIAGNOSIS — D509 Iron deficiency anemia, unspecified: Secondary | ICD-10-CM

## 2023-11-12 DIAGNOSIS — Z807 Family history of other malignant neoplasms of lymphoid, hematopoietic and related tissues: Secondary | ICD-10-CM

## 2023-11-12 NOTE — Assessment & Plan Note (Addendum)
-   Secondary to iron deficiency and malabsorption s/p 3 doses of IV Feraheme last given on 05/10/2023. -He also takes iron tablets every other day but has been inconsistent here recently. -Hematology workup did not reveal any nutritional deficiencies, underlying myeloproliferative neoplasm or evidence of hemolysis. -He denies any bleeding, melena or hematochezia. -Repeat labs from 11/05/2023 showed a ferritin of 36, iron saturation 22% within normal TIBC.  Hemoglobin is 14.7 with normal differential.  B12 is 199 (268).   -We discussed IV iron versus oral iron given overall downtrend of his ferritin but he would like to try and be more consistent with his oral iron. -We discussed follow-up in 3 months with labs.

## 2023-11-12 NOTE — Progress Notes (Unsigned)
 [facility] OFFICE PROGRESS NOTE  Stamey, Chiquita POUR, FNP  ASSESSMENT & PLAN:    Assessment & Plan Iron deficiency anemia, unspecified iron deficiency anemia type - Secondary to iron deficiency and malabsorption s/p 2 doses of IV iron given last on 09/29/2022 and 10/06/2022. -Hematology workup did not reveal any nutritional deficiencies, underlying myeloproliferative neoplasm or evidence of hemolysis. -He denies any bleeding, melena or hematochezia. -Repeat labs from 04/29/2023 shows a ferritin of 28 (35), iron saturation 28% with a normal TIBC.  Hemoglobin is 14.5 (13.2) with a normal differential.  Vitamin B12 level is 268 (256).  - Reports persistent fatigue. -We discussed continuing oral iron which he is taking 3 times per week versus 2 doses of IV Feraheme given ferritin continues to downtrend.  Patient was agreeable to additional IV iron.  He will continue oral iron 3 times per week to avoid constipation. -Recommend follow-up in approximately 6 months with labs a few days before.  Start taking b12 and iron supplments daily- Recerhck labs in 3 months.   No orders of the defined types were placed in this encounter.  INTERVAL HISTORY: Patient returns for follow-up.  We reviewed ***  SUMMARY OF HEMATOLOGIC HISTORY: Oncology History   No history exists.    1.  Normocytic anemia: - Labs at [facility] on 08/28/2022: Hb-9.6, MCV-88, WBC-5.1, PLT-347, creatinine-1.02, calcium  10.6.  Hemoglobin was 8.5 few weeks prior to that.  He started taking iron tablet 2 weeks ago.  Reports constipation. - Reports fatigue for 5 years. - Negative colonoscopy 6 months ago with Dr. Elicia with [facility].  - Stool for occult blood checked negative at [facility].   2.  Social/family history: - Lives at home with his wife.  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  Non-smoker.  Chewed tobacco for few years and quit in 11-22-1975. - Sister died of Hodgkin's disease at  age 57 in 55.   32.  T2a prostate cancer: - Gleason 3+4=7, PSA 7.54. - XRT 70 Gray in 28 fractions from 04/11/2018 - 05/18/2018 - PSA less than 0.1.    CBC    Component Value Date/Time   WBC 6.4 11/05/2023 1040   RBC 4.29 11/05/2023 1040   HGB 14.7 11/05/2023 1040   HCT 45.1 11/05/2023 1040   PLT 247 11/05/2023 1040   MCV 105.1 (H) 11/05/2023 1040   MCH 34.3 (H) 11/05/2023 1040   MCHC 32.6 11/05/2023 1040   RDW 14.2 11/05/2023 1040   LYMPHSABS 1.4 11/05/2023 1040   MONOABS 0.5 11/05/2023 1040   EOSABS 0.1 11/05/2023 1040   BASOSABS 0.0 11/05/2023 1040       Latest Ref Rng & Units 11/05/2023   10:40 AM 04/29/2023    1:11 PM 09/11/2022    9:07 AM  CMP  Glucose 70 - 99 mg/dL 89  841    BUN 8 - 23 mg/dL 29  28    Creatinine 9.38 - 1.24 mg/dL 8.90  8.93    Sodium 864 - 145 mmol/L 145  139    Potassium 3.5 - 5.1 mmol/L 4.8  3.4    Chloride 98 - 111 mmol/L 106  100    CO2 22 - 32 mmol/L 25  25    Calcium  8.9 - 10.3 mg/dL 9.6  9.9  9.1   Total Protein 6.5 - 8.1 g/dL 6.7  7.2    Total Bilirubin 0.0 - 1.2 mg/dL 1.0  1.1    Alkaline Phos 38 - 126  U/L 45  50    AST 15 - 41 U/L 15  17    ALT 0 - 44 U/L 16  13       Lab Results  Component Value Date   FERRITIN 36 11/05/2023   VITAMINB12 199 11/05/2023    There were no vitals filed for this visit.  Review of System:  ROS  Physical Exam: Physical Exam   I spent *** minutes dedicated to the care of this patient (face-to-face and non-face-to-face) on the date of the encounter to include what is described in the assessment and plan.,  Delon Hope, NP 11/12/2023 2:35 PM

## 2023-11-14 DIAGNOSIS — E538 Deficiency of other specified B group vitamins: Secondary | ICD-10-CM | POA: Insufficient documentation

## 2023-11-14 NOTE — Assessment & Plan Note (Signed)
 Recommend 1000 mcg vitamin B-12 daily. Recheck labs in 3 months.

## 2023-11-15 ENCOUNTER — Other Ambulatory Visit: Payer: Self-pay

## 2023-11-15 DIAGNOSIS — I739 Peripheral vascular disease, unspecified: Secondary | ICD-10-CM

## 2023-11-15 MED ORDER — CLOPIDOGREL BISULFATE 75 MG PO TABS: 75.0000 mg | ORAL_TABLET | Freq: Once | ORAL | 3 refills | Status: AC

## 2023-11-16 ENCOUNTER — Other Ambulatory Visit: Payer: Self-pay

## 2023-11-16 DIAGNOSIS — I739 Peripheral vascular disease, unspecified: Secondary | ICD-10-CM

## 2023-11-16 MED ORDER — CLOPIDOGREL BISULFATE 75 MG PO TABS: 75.0000 mg | ORAL_TABLET | Freq: Every day | ORAL | 3 refills | Status: AC

## 2023-12-01 DIAGNOSIS — L4050 Arthropathic psoriasis, unspecified: Secondary | ICD-10-CM | POA: Diagnosis not present

## 2023-12-05 DIAGNOSIS — E1165 Type 2 diabetes mellitus with hyperglycemia: Secondary | ICD-10-CM | POA: Diagnosis not present

## 2023-12-06 ENCOUNTER — Ambulatory Visit

## 2023-12-20 DIAGNOSIS — Z961 Presence of intraocular lens: Secondary | ICD-10-CM | POA: Diagnosis not present

## 2023-12-20 DIAGNOSIS — E119 Type 2 diabetes mellitus without complications: Secondary | ICD-10-CM | POA: Diagnosis not present

## 2023-12-20 DIAGNOSIS — H43813 Vitreous degeneration, bilateral: Secondary | ICD-10-CM | POA: Diagnosis not present

## 2023-12-20 DIAGNOSIS — H40013 Open angle with borderline findings, low risk, bilateral: Secondary | ICD-10-CM | POA: Diagnosis not present

## 2023-12-31 DIAGNOSIS — J069 Acute upper respiratory infection, unspecified: Secondary | ICD-10-CM | POA: Diagnosis not present

## 2023-12-31 DIAGNOSIS — I10 Essential (primary) hypertension: Secondary | ICD-10-CM | POA: Diagnosis not present

## 2023-12-31 DIAGNOSIS — D531 Other megaloblastic anemias, not elsewhere classified: Secondary | ICD-10-CM | POA: Diagnosis not present

## 2023-12-31 DIAGNOSIS — R5383 Other fatigue: Secondary | ICD-10-CM | POA: Diagnosis not present

## 2024-01-04 DIAGNOSIS — E1165 Type 2 diabetes mellitus with hyperglycemia: Secondary | ICD-10-CM | POA: Diagnosis not present

## 2024-01-17 DIAGNOSIS — L851 Acquired keratosis [keratoderma] palmaris et plantaris: Secondary | ICD-10-CM | POA: Diagnosis not present

## 2024-01-17 DIAGNOSIS — E1151 Type 2 diabetes mellitus with diabetic peripheral angiopathy without gangrene: Secondary | ICD-10-CM | POA: Diagnosis not present

## 2024-01-17 DIAGNOSIS — B351 Tinea unguium: Secondary | ICD-10-CM | POA: Diagnosis not present

## 2024-01-17 DIAGNOSIS — E1142 Type 2 diabetes mellitus with diabetic polyneuropathy: Secondary | ICD-10-CM | POA: Diagnosis not present

## 2024-02-01 DIAGNOSIS — E1151 Type 2 diabetes mellitus with diabetic peripheral angiopathy without gangrene: Secondary | ICD-10-CM | POA: Diagnosis not present

## 2024-02-01 DIAGNOSIS — E113293 Type 2 diabetes mellitus with mild nonproliferative diabetic retinopathy without macular edema, bilateral: Secondary | ICD-10-CM | POA: Diagnosis not present

## 2024-02-01 DIAGNOSIS — Z794 Long term (current) use of insulin: Secondary | ICD-10-CM | POA: Diagnosis not present

## 2024-02-09 ENCOUNTER — Inpatient Hospital Stay: Attending: Oncology

## 2024-02-09 DIAGNOSIS — E538 Deficiency of other specified B group vitamins: Secondary | ICD-10-CM | POA: Diagnosis not present

## 2024-02-09 DIAGNOSIS — C61 Malignant neoplasm of prostate: Secondary | ICD-10-CM | POA: Diagnosis not present

## 2024-02-09 DIAGNOSIS — D509 Iron deficiency anemia, unspecified: Secondary | ICD-10-CM

## 2024-02-09 DIAGNOSIS — D508 Other iron deficiency anemias: Secondary | ICD-10-CM | POA: Insufficient documentation

## 2024-02-09 LAB — CBC WITH DIFFERENTIAL/PLATELET
Abs Immature Granulocytes: 0.02 K/uL (ref 0.00–0.07)
Basophils Absolute: 0 K/uL (ref 0.0–0.1)
Basophils Relative: 0 %
Eosinophils Absolute: 0.1 K/uL (ref 0.0–0.5)
Eosinophils Relative: 1 %
HCT: 48.4 % (ref 39.0–52.0)
Hemoglobin: 15.6 g/dL (ref 13.0–17.0)
Immature Granulocytes: 0 %
Lymphocytes Relative: 19 %
Lymphs Abs: 1.2 K/uL (ref 0.7–4.0)
MCH: 33.1 pg (ref 26.0–34.0)
MCHC: 32.2 g/dL (ref 30.0–36.0)
MCV: 102.5 fL — ABNORMAL HIGH (ref 80.0–100.0)
Monocytes Absolute: 0.3 K/uL (ref 0.1–1.0)
Monocytes Relative: 5 %
Neutro Abs: 4.9 K/uL (ref 1.7–7.7)
Neutrophils Relative %: 75 %
Platelets: 220 K/uL (ref 150–400)
RBC: 4.72 MIL/uL (ref 4.22–5.81)
RDW: 13.9 % (ref 11.5–15.5)
WBC: 6.5 K/uL (ref 4.0–10.5)
nRBC: 0 % (ref 0.0–0.2)

## 2024-02-09 LAB — COMPREHENSIVE METABOLIC PANEL WITH GFR
ALT: 20 U/L (ref 0–44)
AST: 21 U/L (ref 15–41)
Albumin: 4.4 g/dL (ref 3.5–5.0)
Alkaline Phosphatase: 57 U/L (ref 38–126)
Anion gap: 16 — ABNORMAL HIGH (ref 5–15)
BUN: 24 mg/dL — ABNORMAL HIGH (ref 8–23)
CO2: 22 mmol/L (ref 22–32)
Calcium: 9.3 mg/dL (ref 8.9–10.3)
Chloride: 100 mmol/L (ref 98–111)
Creatinine, Ser: 0.99 mg/dL (ref 0.61–1.24)
GFR, Estimated: >60 mL/min (ref >60)
Glucose, Bld: 187 mg/dL — ABNORMAL HIGH (ref 70–99)
Potassium: 3.7 mmol/L (ref 3.5–5.1)
Sodium: 138 mmol/L (ref 135–145)
Total Bilirubin: 0.7 mg/dL (ref 0.0–1.2)
Total Protein: 7.1 g/dL (ref 6.5–8.1)

## 2024-02-09 LAB — IRON AND TIBC
Iron: 138 ug/dL (ref 45–182)
Saturation Ratios: 36 % (ref 17.9–39.5)
TIBC: 385 ug/dL (ref 250–450)
UIBC: 247 ug/dL

## 2024-02-09 LAB — VITAMIN B12: Vitamin B-12: 488 pg/mL (ref 180–914)

## 2024-02-09 LAB — FERRITIN: Ferritin: 59 ng/mL (ref 24–336)

## 2024-02-16 ENCOUNTER — Inpatient Hospital Stay: Admitting: Oncology

## 2024-02-16 VITALS — BP 128/68 | HR 78 | Temp 97.8°F | Resp 20 | Wt 225.3 lb

## 2024-02-16 DIAGNOSIS — D508 Other iron deficiency anemias: Secondary | ICD-10-CM | POA: Diagnosis not present

## 2024-02-16 DIAGNOSIS — E538 Deficiency of other specified B group vitamins: Secondary | ICD-10-CM

## 2024-02-16 DIAGNOSIS — D509 Iron deficiency anemia, unspecified: Secondary | ICD-10-CM

## 2024-02-16 NOTE — Assessment & Plan Note (Addendum)
-   Secondary to iron deficiency and malabsorption s/p 3 doses of IV Feraheme last given on 05/10/2023. - Reports he was taking iron supplements but stopped taking them because they were causing some abdominal bloating. -Hematology workup did not reveal any nutritional deficiencies, underlying myeloproliferative neoplasm or evidence of hemolysis. -He denies any bleeding, melena or hematochezia. -Repeat labs from 02/09/2024 showed ferritin 59, iron saturations 36% with a normal hemoglobin 15.6. -No additional IV iron needed at this time.  Recommend oral iron every third day with vitamin C.  If this causes constipation or bloating, you may stop. -We discussed follow-up in 3 months with labs.

## 2024-02-16 NOTE — Progress Notes (Signed)
 [name] OFFICE PROGRESS NOTE  Michael [name], Michael POUR, FNP  ASSESSMENT & PLAN:  Assessment & Plan Other iron deficiency anemia - Secondary to iron deficiency and malabsorption s/p 3 doses of IV Feraheme last given on 05/10/2023. - Reports he was taking iron supplements but stopped taking them because they were causing some abdominal bloating. -Hematology workup did not reveal any nutritional deficiencies, underlying myeloproliferative neoplasm or evidence of hemolysis. -He denies any bleeding, melena or hematochezia. -Repeat labs from 02/09/2024 showed ferritin 59, iron saturations 36% with a normal hemoglobin 15.6. -No additional IV iron needed at this time.  Recommend oral iron every third day with vitamin C.  If this causes constipation or bloating, you may stop. -We discussed follow-up in 3 months with labs.  Vitamin B12 deficiency disease Recommend 1000 mcg vitamin B-12 daily. B12 level improved from 199-488.  Continue B12 supplements. Recheck labs in 3 months.   No orders of the defined types were placed in this encounter.  INTERVAL HISTORY: Patient returns for follow-up for iron deficiency and B12 deficiency.  Patient reports doing fairly well since his last visit.  Denies any hospitalizations, surgeries or changes to his baseline health.  He continues to have right lower extremity swelling but no pain.  He last received IV iron on 05/10/2023.  Reports being inconsistent with his oral iron and B12 supplements.  He has been trying to incorporate iron and B12 in his diet.  Reports he eats greens almost every night.  Denies any bleeding, melena or hematochezia.  Reports some shortness of breath with exertion but otherwise appetite and energy levels are 100%.  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  He intentionally is walking the stairs of his front stoop to try to get  in some exercise.  We reviewed CBC with differential, B12, CMP.  SUMMARY OF HEMATOLOGIC HISTORY: Oncology History   No problem history exists.    1.  Normocytic anemia: - Labs at [facility] on 08/28/2022: Hb-9.6, MCV-88, WBC-5.1, PLT-347, creatinine-1.02, calcium  10.6.  Hemoglobin was 8.5 few weeks prior to that.  He started taking iron tablet 2 weeks ago.  Reports constipation. - Reports fatigue for 5 years. - Negative colonoscopy 6 months ago with Dr. Elicia with [facility].  - Stool for occult blood checked negative at [facility].   2.  Social/family history: - Lives at home with his wife.  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  Chewed tobacco for few years and quit in Mar 11, 1976. - Sister died of Hodgkin's disease at age 41 in 67.   39.  T2a prostate cancer: - Gleason 3+4=7, PSA 7.54. - XRT 70 Gray in 28 fractions from 04/11/2018 - 05/18/2018 - PSA less than 0.1.    CBC    Component Value Date/Time   WBC 6.5 02/09/2024 1051   RBC 4.72 02/09/2024 1051   HGB 15.6 02/09/2024 1051   HCT 48.4 02/09/2024 1051   PLT 220 02/09/2024 1051   MCV 102.5 (H) 02/09/2024 1051   MCH 33.1 02/09/2024 1051   MCHC 32.2 02/09/2024 1051   RDW 13.9 02/09/2024 1051   LYMPHSABS 1.2 02/09/2024 1051   MONOABS 0.3 02/09/2024 1051   EOSABS 0.1 02/09/2024 1051   BASOSABS 0.0 02/09/2024 1051       Latest Ref Rng & Units 02/09/2024   10:51 AM 11/05/2023   10:40  AM 04/29/2023    1:11 PM  CMP  Glucose 70 - 99 mg/dL 812  89  841   BUN 8 - 23 mg/dL 24  29  28    Creatinine 0.61 - 1.24 mg/dL 9.00  8.90  8.93   Sodium 135 - 145 mmol/L 138  145  139   Potassium 3.5 - 5.1 mmol/L 3.7  4.8  3.4   Chloride 98 - 111 mmol/L 100  106  100   CO2 22 - 32 mmol/L 22  25  25    Calcium  8.9 - 10.3 mg/dL 9.3  9.6  9.9   Total Protein 6.5 - 8.1 g/dL 7.1  6.7  7.2   Total Bilirubin 0.0 - 1.2 mg/dL 0.7  1.0  1.1   Alkaline Phos 38 - 126 U/L 57  45  50   AST 15 - 41 U/L 21  15  17     ALT 0 - 44 U/L 20  16  13       Lab Results  Component Value Date   FERRITIN 59 02/09/2024   VITAMINB12 488 02/09/2024    Vitals:   02/16/24 1251  BP: 128/68  Pulse: 78  Resp: 20  Temp: 97.8 F (36.6 C)  SpO2: 96%    Review of System:  Review of Systems  Constitutional:  Negative for malaise/fatigue.  Respiratory:  Positive for shortness of breath.     Physical Exam: Physical Exam Constitutional:      Appearance: Normal appearance.  HENT:     Head: Normocephalic and atraumatic.  Eyes:     Pupils: Pupils are equal, round, and reactive to light.  Cardiovascular:     Rate and Rhythm: Normal rate and regular rhythm.     Heart sounds: Normal heart sounds. No murmur heard. Pulmonary:     Effort: Pulmonary effort is normal.     Breath sounds: Normal breath sounds. No wheezing.  Abdominal:     General: Bowel sounds are normal. There is no distension.     Palpations: Abdomen is soft.     Tenderness: There is no abdominal tenderness.  Musculoskeletal:        General: Normal range of motion.     Cervical back: Normal range of motion.  Skin:    General: Skin is warm and dry.     Findings: No rash.  Neurological:     Mental Status: He is alert and oriented to person, place, and time.     Gait: Gait is intact.  Psychiatric:        Mood and Affect: Mood and affect normal.        Cognition and Memory: Memory normal.        Judgment: Judgment normal.     I provided 20 minutes of non face-to-face telephone visit time during this encounter, and > 50% was spent counseling as documented under my assessment & plan.   Delon Hope, NP 02/16/2024 1:01 PM

## 2024-02-16 NOTE — Assessment & Plan Note (Addendum)
 Recommend 1000 mcg vitamin B-12 daily. B12 level improved from 199-488.  Continue B12 supplements. Recheck labs in 3 months.

## 2024-02-28 ENCOUNTER — Encounter: Payer: Self-pay | Admitting: *Deleted

## 2024-06-14 ENCOUNTER — Inpatient Hospital Stay

## 2024-06-21 ENCOUNTER — Inpatient Hospital Stay: Admitting: Oncology
